# Patient Record
Sex: Female | Born: 1940 | Race: White | Hispanic: No | Smoking: Never smoker
Health system: Southern US, Community
[De-identification: ages and names within clinical notes are randomized; demographics above are authoritative.]

## PROBLEM LIST (undated history)

## (undated) DIAGNOSIS — K219 Gastro-esophageal reflux disease without esophagitis: Secondary | ICD-10-CM

## (undated) DIAGNOSIS — T7840XA Allergy, unspecified, initial encounter: Secondary | ICD-10-CM

## (undated) DIAGNOSIS — N289 Disorder of kidney and ureter, unspecified: Secondary | ICD-10-CM

## (undated) DIAGNOSIS — D649 Anemia, unspecified: Secondary | ICD-10-CM

## (undated) DIAGNOSIS — I5189 Other ill-defined heart diseases: Secondary | ICD-10-CM

## (undated) DIAGNOSIS — M8580 Other specified disorders of bone density and structure, unspecified site: Secondary | ICD-10-CM

## (undated) DIAGNOSIS — I10 Essential (primary) hypertension: Secondary | ICD-10-CM

## (undated) DIAGNOSIS — I427 Cardiomyopathy due to drug and external agent: Secondary | ICD-10-CM

## (undated) DIAGNOSIS — T451X5A Adverse effect of antineoplastic and immunosuppressive drugs, initial encounter: Secondary | ICD-10-CM

## (undated) DIAGNOSIS — C8590 Non-Hodgkin lymphoma, unspecified, unspecified site: Secondary | ICD-10-CM

## (undated) DIAGNOSIS — R011 Cardiac murmur, unspecified: Secondary | ICD-10-CM

## (undated) DIAGNOSIS — S82892A Other fracture of left lower leg, initial encounter for closed fracture: Secondary | ICD-10-CM

## (undated) HISTORY — DX: Cardiomyopathy due to drug and external agent: I42.7

## (undated) HISTORY — DX: Other specified disorders of bone density and structure, unspecified site: M85.80

## (undated) HISTORY — DX: Cardiac murmur, unspecified: R01.1

## (undated) HISTORY — DX: Essential (primary) hypertension: I10

## (undated) HISTORY — DX: Allergy, unspecified, initial encounter: T78.40XA

## (undated) HISTORY — DX: Disorder of kidney and ureter, unspecified: N28.9

## (undated) HISTORY — PX: SPINE SURGERY: SHX786

## (undated) HISTORY — PX: ABDOMINAL HYSTERECTOMY: SHX81

## (undated) HISTORY — DX: Non-Hodgkin lymphoma, unspecified, unspecified site: C85.90

## (undated) HISTORY — DX: Gastro-esophageal reflux disease without esophagitis: K21.9

## (undated) HISTORY — PX: BACK SURGERY: SHX140

## (undated) HISTORY — DX: Adverse effect of antineoplastic and immunosuppressive drugs, initial encounter: T45.1X5A

## (undated) HISTORY — PX: APPENDECTOMY: SHX54

---

## 1998-09-21 ENCOUNTER — Other Ambulatory Visit: Admission: RE | Admit: 1998-09-21 | Discharge: 1998-09-21 | Payer: Self-pay | Admitting: Obstetrics and Gynecology

## 1999-09-29 ENCOUNTER — Other Ambulatory Visit: Admission: RE | Admit: 1999-09-29 | Discharge: 1999-09-29 | Payer: Self-pay | Admitting: Obstetrics and Gynecology

## 2000-12-04 ENCOUNTER — Other Ambulatory Visit: Admission: RE | Admit: 2000-12-04 | Discharge: 2000-12-04 | Payer: Self-pay | Admitting: *Deleted

## 2001-12-25 ENCOUNTER — Other Ambulatory Visit: Admission: RE | Admit: 2001-12-25 | Discharge: 2001-12-25 | Payer: Self-pay | Admitting: Obstetrics and Gynecology

## 2002-01-12 ENCOUNTER — Other Ambulatory Visit: Admission: RE | Admit: 2002-01-12 | Discharge: 2002-01-12 | Payer: Self-pay | Admitting: *Deleted

## 2002-05-18 ENCOUNTER — Encounter: Admission: RE | Admit: 2002-05-18 | Discharge: 2002-05-18 | Payer: Self-pay | Admitting: Family Medicine

## 2002-05-18 ENCOUNTER — Encounter: Payer: Self-pay | Admitting: Family Medicine

## 2003-01-28 ENCOUNTER — Encounter: Payer: Self-pay | Admitting: Family Medicine

## 2003-01-28 ENCOUNTER — Encounter: Admission: RE | Admit: 2003-01-28 | Discharge: 2003-01-28 | Payer: Self-pay | Admitting: Family Medicine

## 2003-02-03 ENCOUNTER — Encounter: Payer: Self-pay | Admitting: Family Medicine

## 2003-02-03 ENCOUNTER — Encounter: Admission: RE | Admit: 2003-02-03 | Discharge: 2003-02-03 | Payer: Self-pay | Admitting: Family Medicine

## 2003-02-05 ENCOUNTER — Encounter: Payer: Self-pay | Admitting: Family Medicine

## 2003-02-05 ENCOUNTER — Encounter: Admission: RE | Admit: 2003-02-05 | Discharge: 2003-02-05 | Payer: Self-pay | Admitting: Family Medicine

## 2003-02-25 ENCOUNTER — Encounter: Payer: Self-pay | Admitting: Neurosurgery

## 2003-03-01 ENCOUNTER — Ambulatory Visit (HOSPITAL_COMMUNITY): Admission: RE | Admit: 2003-03-01 | Discharge: 2003-03-02 | Payer: Self-pay | Admitting: Neurosurgery

## 2003-03-01 ENCOUNTER — Encounter: Payer: Self-pay | Admitting: Neurosurgery

## 2003-03-19 ENCOUNTER — Encounter: Payer: Self-pay | Admitting: Neurosurgery

## 2003-03-19 ENCOUNTER — Ambulatory Visit (HOSPITAL_COMMUNITY): Admission: RE | Admit: 2003-03-19 | Discharge: 2003-03-19 | Payer: Self-pay | Admitting: *Deleted

## 2003-05-27 ENCOUNTER — Other Ambulatory Visit: Admission: RE | Admit: 2003-05-27 | Discharge: 2003-05-27 | Payer: Self-pay | Admitting: Obstetrics and Gynecology

## 2004-05-15 ENCOUNTER — Inpatient Hospital Stay (HOSPITAL_COMMUNITY): Admission: RE | Admit: 2004-05-15 | Discharge: 2004-05-19 | Payer: Self-pay | Admitting: Neurosurgery

## 2004-11-25 ENCOUNTER — Inpatient Hospital Stay (HOSPITAL_COMMUNITY): Admission: EM | Admit: 2004-11-25 | Discharge: 2004-11-28 | Payer: Self-pay | Admitting: Emergency Medicine

## 2005-01-05 ENCOUNTER — Encounter: Admission: RE | Admit: 2005-01-05 | Discharge: 2005-01-05 | Payer: Self-pay | Admitting: Family Medicine

## 2006-01-04 ENCOUNTER — Ambulatory Visit: Payer: Self-pay | Admitting: Family Medicine

## 2006-04-15 ENCOUNTER — Ambulatory Visit: Payer: Self-pay | Admitting: Family Medicine

## 2006-04-18 ENCOUNTER — Ambulatory Visit: Payer: Self-pay | Admitting: Family Medicine

## 2006-07-30 HISTORY — PX: COLONOSCOPY: SHX174

## 2006-11-26 ENCOUNTER — Ambulatory Visit: Payer: Self-pay | Admitting: Internal Medicine

## 2006-12-12 LAB — COMPREHENSIVE METABOLIC PANEL
ALT: 15 U/L (ref 0–35)
Alkaline Phosphatase: 54 U/L (ref 39–117)
BUN: 18 mg/dL (ref 6–23)
CO2: 28 mEq/L (ref 19–32)
Chloride: 102 mEq/L (ref 96–112)
Glucose, Bld: 139 mg/dL — ABNORMAL HIGH (ref 70–99)
Sodium: 141 mEq/L (ref 135–145)
Total Bilirubin: 0.6 mg/dL (ref 0.3–1.2)

## 2006-12-12 LAB — CBC WITH DIFFERENTIAL/PLATELET
BASO%: 0.3 % (ref 0.0–2.0)
Basophils Absolute: 0 10*3/uL (ref 0.0–0.1)
HGB: 15.4 g/dL (ref 11.6–15.9)
MCV: 84.3 fL (ref 81.0–101.0)
MONO#: 0.3 10*3/uL (ref 0.1–0.9)
MONO%: 6.3 % (ref 0.0–13.0)
NEUT#: 3.4 10*3/uL (ref 1.5–6.5)
NEUT%: 66.8 % (ref 39.6–76.8)
Platelets: 150 10*3/uL (ref 145–400)
RBC: 5.14 10*6/uL (ref 3.70–5.32)
WBC: 5.1 10*3/uL (ref 3.9–10.0)

## 2007-05-27 ENCOUNTER — Ambulatory Visit: Payer: Self-pay | Admitting: Family Medicine

## 2007-08-12 ENCOUNTER — Ambulatory Visit: Payer: Self-pay | Admitting: Family Medicine

## 2008-05-12 ENCOUNTER — Ambulatory Visit: Payer: Self-pay | Admitting: Family Medicine

## 2008-08-09 ENCOUNTER — Ambulatory Visit: Payer: Self-pay | Admitting: Family Medicine

## 2008-09-09 ENCOUNTER — Ambulatory Visit: Payer: Self-pay | Admitting: Family Medicine

## 2009-04-08 ENCOUNTER — Ambulatory Visit: Payer: Self-pay | Admitting: Family Medicine

## 2010-04-05 ENCOUNTER — Ambulatory Visit: Payer: Self-pay | Admitting: Family Medicine

## 2010-04-27 ENCOUNTER — Encounter: Admission: RE | Admit: 2010-04-27 | Discharge: 2010-04-27 | Payer: Self-pay | Admitting: Family Medicine

## 2010-05-30 ENCOUNTER — Ambulatory Visit: Payer: Self-pay | Admitting: Family Medicine

## 2010-06-14 ENCOUNTER — Ambulatory Visit: Payer: Self-pay | Admitting: Family Medicine

## 2010-08-07 ENCOUNTER — Ambulatory Visit
Admission: RE | Admit: 2010-08-07 | Discharge: 2010-08-07 | Payer: Self-pay | Source: Home / Self Care | Attending: Family Medicine | Admitting: Family Medicine

## 2010-12-15 NOTE — Discharge Summary (Signed)
NAME:  Tonya Wise, Tonya Wise NO.:  [id number]   MEDICAL RECORD NO.:  [id number]          PATIENT TYPE:  INP   LOCATION:  4704                         FACILITY:  [facility]   PHYSICIAN:  Michaelyn Barter, M.D. DATE OF BIRTH:  04/05/1941   DATE OF ADMISSION:  11/25/2004  DATE OF DISCHARGE:  11/28/2004                                 DISCHARGE SUMMARY   PRIMARY CARE PHYSICIAN:  Sharlot Gowda, M.D.   FINAL DIAGNOSES AT THE TIME OF DISCHARGE:  1.  Nausea, vomiting, diarrhea syndrome.  2.  Atypical chest pain.  3.  Vague abdominal pain.  4.  Questionable syncope.  5.  Hypertension  6.  Chronic low back pain  7.  Acute renal failure  8.  Hypokalemia.   HISTORY OF PRESENT ILLNESS:  Tonya Wise is a 70 year old female who arrived  with a chief complaint of nausea, vomiting, diarrhea over the previous 7-10  days. she later went on to develop abdominal pain. She went on to state that  she became lethargic and weak, and her appetite had decreased. She also went  on to state that the 48 hours prior to admission she developed some midline  chest pain which was burning  and states burning in sensation.  She  complained of some lightheadedness and dizziness.  There is also a question  as to whether not the patient had a syncopal episode approximately 72 hours  prior to the admission   PAST MEDICAL HISTORY:  1.  Hypertension.  2.  Chronic low back pain   PAST SURGICAL HISTORY:  L3-4/ L4-L5 posterior lumbar interbody fusion with  interbody cages and segmented pedicle screw fixation placed at L3-5 with  posterior L3-5 fusion occurring in 2005   ALLERGIES:  PENICILLIN causes hives.   FAMILY HISTORY:  Mother passed away from leukemia.   HOSPITAL COURSE:  #1.  NAUSEA, VOMITING, AND DIARRHEA:  The patient was admitted for further  evaluation of these symptoms. By the following day of her admission, her  symptoms gradually began to resolve, and by 3 days into her admission, the  symptoms had completely resolved.   #2.  ATYPICAL CHEST PAIN:  Over the course of the patient's hospitalization,  her chest pain resolved. She had cardiac enzymes completed. Troponin was  noted to be 0.01 which is completely normal. In addition, she did have a  fasting lipid profile completed. The triglycerides were found to be high at  230. The cholesterol was noted to be 34, which was low, and her LDL was  noted to be 103.   #3.  HYPERTENSION:  Over the course of the patient's hospitalization, her  blood pressure was noted to be elevated. On the day of her admission, her  blood pressure in the ER was noted to be 194/133. Her medications were  gradually titrated up to try to achieve optimal control of her blood  pressure. Controlling her blood pressure was somewhat difficult, however,  because the patient had received some stressful news regarding the death of  close family associate.  Therefore, she became very stressed over the course  of hospitalization   #4  RENAL FAILURE: On the day of her admission, the patient's creatinine was  noted to be 2.8; however, with fluids and close monitoring, this value  gradually declined over the course of hospitalization to 1.8 By the date of  her discharge. This more than likely had a component of prerenal failure to  it.   #5.  CHRONIC BACK PAIN: This remained stable over the course of the  patient's hospitalization.   #6.  HYPOKALEMIA: On the date of discharge, the patient's potassium was  noted to be 3.4. For this, she received some supplementation. By Nov 28, 2004, the patient stated that she had been stressed throughout the course of  the night. She had started crying, stated that she needed to go home on that  particular date to be with her daughters. She had no complaints. Therefore,  the decision was made to discharge the patient home. The patient's vitals  were temperature was 97.6, heart rate in the 70s to 90s, blood pressure was   between 152-184 systolically, 86-104 diastolically. She was saturating 98%  on room air. The decision was made to discharge the patient home at her  request. She was discharged home on the following medications.  1.  Norvasc 10 milligrams 1 tablet daily.  2.  Aspirin 325 milligrams daily.  3.  Lopressor 50 milligrams b.i.d.  4.  Protonix 40 milligrams daily.   She was instructed to avoid salt in her diet and to take all her medications  as prescribed. In addition, she was told to follow up with Dr. Susann Givens  within 1-2 weeks for further evaluation of her blood pressure.       OR/MEDQ  D:  01/20/2005  T:  01/21/2005  Job:  [id number]   cc:   Sharlot Gowda, M.D.  [address]  [place], [place] [address]  Fax: [phone]

## 2010-12-15 NOTE — Op Note (Signed)
NAME:  Tonya Wise, Tonya Wise                          ACCOUNT NO.:  [id number]   MEDICAL RECORD NO.:  [id number]                   PATIENT TYPE:  OIB   LOCATION:  3172                                 FACILITY:  [facility]   PHYSICIAN:  Kathaleen Maser. Pool, M.D.                 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   OPERATION PERFORMED:  Left L4-5 laminotomy and microdiskectomy and left L5-  S1 laminotomy and microdiskectomy.   SURGEON:  Kathaleen Maser. Pool, M.D.   ASSISTANT:  Reinaldo Meeker, M.D.   ANESTHESIA:  General endotracheal.   INDICATIONS FOR PROCEDURE:  Tonya Wise is a 70 year old female with a  history of back and left lower extremity pain consistent with a left-sided  L5 radiculopathy failing conservative management.  MRI scan demonstrates a  large left paracentral disk herniation at L4-5 with compression of the  thecal sac and left-sided L5 nerve root.  She also had a co-existent left-  sided L5-S1 disk herniation with a superior free fragment causing  compression of the exiting left-sided L5 nerve root.  The patient has been  counseled as to her options.  She has decided to proceed with a two-level  laminotomy and microdiskectomy for hopeful improvement of the symptoms.   DESCRIPTION OF PROCEDURE:  The patient was taken to the operating room and  placed on the table in the supine position.  After adequate level of  anesthesia was achieved, the patient was positioned prone onto a Wilson  frame and appropriately padded.  The patient's lumbar region was prepped and  draped sterilely.  A 10 blade was used to  make a linear skin incision  overlying the L4, 5 and S1 levels.  This was carried down sharply in the  midline.  A subperiosteal dissection was then performed exposing the lamina  and facet joints of L4, 5, and S1.  Deep self-retaining retractor was  placed.  Intraoperative x-ray was taken, the levels were confirmed.  The  laminotomy was then performed using high speed drill and Kerrison rongeurs  to remove the inferior aspect of the lamina at L4, the medial aspect of the  L4-5 facet joint complex, the entire left L5 lamina and the medial aspect of  the L5-S1 facet joint complex and the superior rim of the S1 lamina.  The  ligamentum flavum was then elevated and resected in piecemeal fashion at  both levels.  The underlying thecal sac and exiting L5 and S1 nerve roots  were identified.  The microscope was brought into the field and used for  microdissection  of the disk herniation and overlying nerve roots.  The  epidural venous plexus was coagulated and cut.  Starting first at L4-5 on  the left, the thecal sac and L5 nerve root were mobilized and retracted  toward the left.  The epidural veins were coagulated and cut.  The disk  herniation was readily apparent.  This was then incised with a 15 blade in a  rectangular fashion.  A wide disk space clean out was then achieved using  pituitary rongeurs, upward angled pituitary rongeurs and Epstein curets.  All elements of disk herniation were completely resected.  All loose or  obviously degenerated disk material was removed from the interspace.  At  this point a very thorough decompression had been achieved.  There was no  evidence of any further compression.  There was no evidence of any residual  loose disk material either within the disk space or in the canal. Attention  was then placed to L5-S1.  The disk space was identified.  Epidural venous  plexus was coagulated and cut.  Thecal sac and nerve roots were gently  mobilized.  The disk  space itself was reasonably flat and there was no major  evidence of an annular defect.  Moving superiorly to the L5 nerve root, a  moderate amount of free disk herniation was encountered and dissected free.  This was dissected using blunt nerve hooks and pituitary rongeurs.  The disk  space was once again inspected. Given its appearance, it was not felt  necessary to incise the disk space and perform a more aggressive diskectomy  at this level.  The wound was then irrigated with antibiotic solution.  Gelfoam was placed topically for hemostasis which was found to be good.  Microscope and retractor system were removed.  Hemostasis in the muscle was  achieved with electrocautery.  The wound was then closed in layers with  Vicryl sutures.  Steri-Strips and sterile dressing were applied.  There were  no apparent complications.  The patient tolerated the procedure well and  returned to the recovery room postoperatively.                                                Henry A. Pool, M.D.    HAP/MEDQ  D:  03/01/2003  T:  03/01/2003  Job:  [id number]

## 2010-12-15 NOTE — Discharge Summary (Signed)
NAME:  Tonya Wise, PIRANI NO.:  [id number]   MEDICAL RECORD NO.:  [id number]          PATIENT TYPE:  INP   LOCATION:  3018                         FACILITY:  [facility]   PHYSICIAN:  Clydene Fake, M.D.  DATE OF BIRTH:  Oct 09, 1940   DATE OF ADMISSION:  05/15/2004  DATE OF DISCHARGE:  05/19/2004                                 DISCHARGE SUMMARY   DIAGNOSES:  Unstable spondylolisthesis, stenosis, spondylosis and prior  surgery.   DISCHARGE DIAGNOSES:  Unstable spondylolisthesis, stenosis, spondylosis and  prior surgery.   PROCEDURE:  L3-4 and L4-5 redo posterior lumber interbody fusion at 3-4 and  4-5 with  Brantigan interbody cages and segmented pedicle screw fixation at L3-5 with  posterolateral fusion L3-5 with autograft, allograft and Symphony.   REASON FOR ADMISSION:  The patient is a 70 year old woman who is having back  and right leg pain and numbness who underwent left 4-5 laminectomy in 2004.  Really had not had much improvement after that.  Underwent pain management,  multiple injections, but has not given any lasting relief.  New workup  showed a significant decrease in disc height at 4-5 with lordotic changes,  recurrent stenosis, and __________ narrowing, with some retrolisthesis at 3-  4 but does move with flexion and extension showing instability.  The patient  was brought in for redo decompression and fusion at 3-5.   HOSPITAL COURSE:  Patient was admitted day of surgery.  Underwent procedure  above without complications.  Postoperative patient was transferred to  recovery room and then to the floor.  There she was doing well.  She had  some incisional pain, but no leg pain.  Started getting up ambulating.  PT/OT worked with the patient.  She continued making some progress; did have  some left thigh pain as she started moving around but this did continue to  improve.  May 19, 2004 she is overall doing well, little bit of thigh  pain, but overall  motor and sensory examination is intact.  Improving  incisional pain.  Incision was clean, dry and intact.  ____________ by then  and was discharged home in stable condition.   DISCHARGE MEDICATIONS:  Same as prehospitalization, Percocet and Flexeril  p.r.n.   FOLLOWUP:  Three to four weeks in office.       JRH/MEDQ  D:  06/29/2004  T:  06/29/2004  Job:  [id number]

## 2010-12-15 NOTE — H&P (Signed)
NAME:  Tonya Wise, Tonya Wise NO.:  [id number]   MEDICAL RECORD NO.:  [id number]          PATIENT TYPE:  INP   LOCATION:  2899                         FACILITY:  [facility]   PHYSICIAN:  Clydene Fake, M.D.  DATE OF BIRTH:  1941/02/28   DATE OF ADMISSION:  05/15/2004  DATE OF DISCHARGE:                                HISTORY & PHYSICAL   CHIEF COMPLAINT:  Back pain.   HISTORY:  The patient is a 70 year old right-handed woman who has been  having back and some right leg pain and numbness.  In August 2004 she did  undergo left 4-5 laminectomy and maybe also a left 5-1.  Has had episodes of  pain management for back pain, that through multiple injections.  None have  helped much, but activity, standing or sitting too long makes her back hurt,  with occasional similar pain radiating to the right leg, where she had more  left leg symptoms prior to her surgery over a year ago.  MRI and x-rays of  her spine with flexion-extension views show significant decreased disk  height at L4-5 with mortic changes consistent with recurring stenosis and  bifrontal narrowing.  There is a retrolisthesis at 3-4 that does move  between flexion and extension, showing the instability at 3-4 and stenosis  at that level, and the patient is going to be readmitted for a redo  laminectomy decompression and fusion at 3-4 and 4-5.   PAST MEDICAL HISTORY:  Significant for hypertension.   PAST SURGICAL HISTORY:  Previous surgery includes lumbar laminectomy in  2004.   MEDICATIONS:  Toprol, Tramadol, Voltaren.   DRUG ALLERGIES:  PENICILLIN.   SOCIAL HISTORY:  She is married.  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  Does not smoke.  Uses alcohol socially.   REVIEW OF SYSTEMS:  Otherwise negative.   FAMILY HISTORY:  Noncontributory.   PHYSICAL EXAMINATION:  VITAL SIGNS:  The patient's weight is 135, height 5  feet 3 inches.  HEENT:  Unremarkable.  NECK:  Supple.  CHEST:  Lungs  clear.  CARDIAC:  Regular rhythm.  ABDOMEN:  Soft and nontender.  EXTREMITIES:  No edema.  BACK, NEUROLOGIC:  There is a well-healed scar in the middle of the lower  lumbar spine.  Decreased range of motion because of pain at the extremes.  Straight leg raise is negative.  Motor strength intact, sensation intact.   ASSESSMENT AND PLAN:  Patient with unstable spondylolisthesis and stenosis.  The patient is brought in for a decompressive laminectomy and fusion with  instrumentation.       JRH/MEDQ  D:  05/15/2004  T:  05/15/2004  Job:  [id number]

## 2010-12-15 NOTE — Op Note (Signed)
NAME:  Tonya Wise, Tonya Wise NO.:  [id number]   MEDICAL RECORD NO.:  [id number]          PATIENT TYPE:  INP   LOCATION:  2899                         FACILITY:  [facility]   PHYSICIAN:  Clydene Fake, M.D.  DATE OF BIRTH:  1941-03-24   DATE OF PROCEDURE:  05/15/2004  DATE OF DISCHARGE:                                 OPERATIVE REPORT   DIAGNOSIS:  Unstable spondylolisthesis, stenosis, spondylosis and prior  surgery at L3-4, 4-5.   POSTOPERATIVE DIAGNOSIS:  Unstable spondylolisthesis, stenosis, spondylosis  and prior surgery at L3-4, 4-5.   PROCEDURE:  L3-4 and 4-5 redo laminectomy, posterior lumbar interbody fusion  L3-4 and 4-5 with Brantigan interbody cages at L3-4 and L4-5, Expedium  segmented pedicle screw fixation L3-4 and 5, posterolateral fusion L3  through 5 (two levels) with autograft from same incision, allograft and  Symphony system.   SURGEON:  Clydene Fake, M.D.   ASSISTANT:  Cristi Loron, M.D.   ANESTHESIA:  General endotracheal tube anesthesia.   ESTIMATED BLOOD LOSS:  500 mL, some Cell Saver blood given.   DRAINS:  None.   COMPLICATIONS:  None.   DISPOSITION:  The patient transferred to the recovery room.   INDICATIONS FOR PROCEDURE:  The patient is a 70 year old woman who underwent  left-sided lumbar laminectomy at 4-5, 5-1 by another surgery in 2004,  continued to have low back pain with some right leg pain and followed in the  pain clinic.  Repeat imaging, MRI and x-rays with flexion and extension show  instability at 3-4 an 4-5 with continued stenosis.  The patient brought in  for decompression and fusion of lumbar spine.   DESCRIPTION OF PROCEDURE:  The patient brought to the operating room and  general anesthesia induced.  The patient was placed in the prone position on  the Wilson frame with all pressure points padded.  The patient was prepped  and draped in sterile fashion.  The site of incision was injected with 20 mL  1%  lidocaine with epinephrine.  Incision was then made, centered over the  previous incision and this incision was then brought up cephalad.  Incision  taken down to fascia.  Hemostasis obtained with Bovie cauterization.  Fascia  was incised with Bovie and subperiosteal dissection was done over the  spinous process of lamina.  After this, L3, 4, 5 spinous process, transverse  process of L4 and L5, were found to be dissected out on the opposite site.  We placed self-retaining retractor, brought fluoroscope in with markers and  confirmed we were at the L4-5 interspace at our lowest level.  Incision was  then made slightly cephalad some more, incised subcutaneous tissue and  fascia.  Subperiosteal dissection was done at L2 spinous process in the  lamina leaving the facet intact, dissecting out over it and the L3  transverse processes.  We now had exposure from transverse process of L3  through L5.  We again confirmed position with fluoroscopic imaging and  decompressive laminectomy was performed removing the bottom half of L3, all  of L4 and top of L5  started with Leksell rongeurs and with Kerrison punches  and high speed drill.  Full facetectomies ended up being performed at 3-4  and 4-5 so we can get out lateral to this space on the left side lateral to  the scar that was there at 4-5.  We carefully dissected the scar __________  dura.  We were able to get over the nerve roots.  Ligamentum flavum was  removed and we brought the nerve roots out and we had good decompression  central and laterally over the three roots __________ five roots bilaterally  with central canal all decompressed.  Attention was then taken to the disc  spaces.  Large similar disc protrusions were seen.  Disc spaces were incised  with #15 blade at 4-5 level bilaterally and discectomy performed with  pituitary rongeurs and curettes.  We did find some recurrent disc herniation  within the left far lateral area at the 4-5  level.  We distracted the space  up to 9 mm, prepared the interspace for interbody fusion with the broaches  per the McKee system.  We had good __________ nerves removed over the  disc material and good distraction of the interbody space and space is  prepared.  All the bone that we removed during the laminectomy was chopped  clean from soft tissue, chopped in small pieces, placed through the Symphony  system to get __________.  This bone mixture was then packed into two 9 x 9  mm running cages.  We packed some of the same bone in the interspace and  then tapped a cage into one side while distraction on the opposite side,  removed the distractor put more autograft bone in and then tapped another  cage into place while we protected the nerve roots.  We had good position of  the cages, good restoration of the space at 4-5 of decreased __________  scoliosis we are working on correcting.  Attention was then taken to 3-4  disc space and incised with a 15 blade, and discectomy performed with  pituitary rongeurs and curettes.  Lateral disc space also explored and disc  removed.  We distracted the interspace up to 11 mm __________ the interbody  space for interbody fusion using the broaches.  When we were satisfied with  decompression, an __________ x 99 wire and __________ cages packed with  autograft Symphony bone mixture.  The same bone mixture was packed in the  interbody space and then the cage tapped with a __________ we held  distraction on the other.  Distractor was removed and packed cage to that  side.  With good position of the cages countersunk well, good decompression  of the nerves.  Wound irrigated with antibiotic solution.  Gelfoam and  thrombin in the epidural space for hemostasis.  Attention was then turned to  the transverse process and lateral facets which was decorticated with high speed drill L3, 4 and 5. Using fluoroscopy we found pedicle anterior points  at L3  bilaterally.  We decorticated with high speed drill.  We placed  __________ down the pedicle using fluoroscopy as a guide.  We checked the  hole with small __________ probe.  We had a bony circumference.  We tapped  the hole and then we checked the hole with a small round __________ making  sure we had a bony circumference.  We then placed a 45 mm Expedium screw x 6  wide.  This process was repeated on the left side at L3  level.  Attention  was then taken to L4 and found pedicle anterior points decorticated.  We  placed a probe, tapped, checked the hall with __________ and we used  fluoroscopy throughout this process and then placed another 45 mm screw and  repeated this on the opposite side of L4-L5.  We again decorticated  __________ points, placed a probe down and into the pedicle, tapped it using  small __________ making sure we had bony circumference and placed another 45  mm screw which was repeated on the opposite side.  Bilateral fluoroscopic  imaging showed good position of the screws at all levels and good position  of interbody cages.  Rods were placed on the screw heads and locking nuts  placed.  The nuts over L3 were tightened down with distraction over 3-4,  nuts in L4 were tightened down.  There was compression over 3-4 with  compression over 4-5.  We then locked the nuts at L5.  Final AP and lateral  fluoroscopic images were obtained showing good position of all the screws,  rods, interbody cages and good alignment of the spine in the sagittal and  coronal planes.  Wounds irrigated with antibiotic solution and the rest of  the Autograft Symphony bone mixture along with the Allograft bone mixture  was mixed together and this bone was then packed  in the posterolateral  gutters for two level posterolateral fusion from L3 through L5 bilaterally.  Retractors removed.  We again checked the dura.  We had good decompression.  All of the roots showed clear placement of Gelfoam in the  gutters and the  __________ muscle closed with 0 Vicryl interrupted sutures.  Fascia closed  with 0 Vicryl interrupted sutures.  The subcutaneous tissue closed with 0, 2-  0 and 3-0 Vicryl  interrupted sutures.  The skin closed with Benzoin and Steri-Strips.  Dry  sterile dressing was placed.  The patient was placed back in the supine  position, awakened from anesthesia and transferred to the recovery room in  stable condition.       JRH/MEDQ  D:  05/15/2004  T:  05/15/2004  Job:  [id number]

## 2010-12-15 NOTE — H&P (Signed)
NAME:  REAGHAN, KAWA NO.:  [id number]   MEDICAL RECORD NO.:  [id number]          PATIENT TYPE:  EMS   LOCATION:  [facility]                         FACILITY:  [facility]   PHYSICIAN:  Gertha Calkin, M.D.DATE OF BIRTH:  [birth year]   DATE OF ADMISSION:  11/25/2004  DATE OF DISCHARGE:                                HISTORY & PHYSICAL   PRIMARY CARE PHYSICIAN:  Sharlot Gowda, M.D.   HISTORY OF PRESENT ILLNESS:  This is a pleasant, 70 year old, Caucasian  female, past medical history significant for hypertension, who presents with  a history of 7-10 days of nausea, vomiting, diarrhea that is nonbilious and  nonbloody.  She is unable to state whether or not this is related to going  to any new restaurants but denies any other family members or friends having  similar symptoms.  She states that initially it was associated with mostly  nausea and diarrhea but then later started developing abdominal pain.  The  abdominal pain really started approximately 24-36 hours prior to admission.  She denies any hematemesis or hemoptysis.  Denies any previous history of  similar symptoms.  She states she does drink well water but denies that any  other family members in the household have similar symptoms.  After the  course of the last few days, she has become increasingly lethargic and weak  in general.  Her appetite, as of Monday approximately six days prior to  admission, has also decreased.  She denies keeping up with adequate fluid  intake during this time period.  No recent travel history or exotic food  ingestions.  In regards to the abdominal pain, she states that the abdominal  pain has more or less worsened over the last few days but when questioned  further states that she has had this abdominal discomfort for approximately  a month.  She was worried that she was developing an ulcer from chronic  medications.  Of note, she is prescribed pain medications for chronic back  pain  status post surgery (see problems list below).  When asked to point to  the location of her abdominal pain, she points to an area around the  suprapubic region.  In addition to these symptoms, she also states that over  the last 48 hours she has developed some midline chest pain that is more  burning in quality than anything.  She denies any radiation.  No  palpitations.  No diaphoresis.  These symptoms were not associated but she  states she is having hot flashes but not associated with the chest  discomfort.  She also admits to lightheadedness and dizziness but this has  also come on in the last few days after at least a week of poor intake and  nausea and vomiting and diarrhea.  After further questioning, she states  that she may have had a syncopal spell approximately 72 hours prior to  admission where she blacked out but denies any postictal like symptoms.  She  states that she was getting up to go to the bathroom and then was too weak  and fell and  may or may not have blacked out completely.  She does not  remember having any chest pain, chest tightness.  Family in the room  includes her daughter and her husband who state that about 24 to 36 hours  prior to arrival to the ED, the patient was having some difficulty in  forming words and maybe some increased weakness in her left arm.   PAST MEDICAL HISTORY:  1.  Hypertension.  2.  Chronic lower back pain.  3.  In 2005, she underwent a L3-L4/L4-L5 posterior lumbar interbody fusion      with a Brantigan interbody cages and segmented pedicle screw fixation      placed at L3-L5 with a posterior L3-L5 fusion.   MEDICATIONS:  1.  Toprol 25 mg p.o. every day.  2.  Diclofenac 10 mg p.o. every day.  3.  Cyclobenzaprine p.r.n.  4.  Phenergan p.r.n. (Phenergan was started on November 23, 2004).   ALLERGIES:  PENICILLIN causes her to have hives.   PAST SURGICAL HISTORY:  Only pertinent for her back.  No abdominal or  thoracic surgeries.    FAMILY HISTORY:  Positive for leukemia in her mother in her 63s, passed away  from that but no known coronary artery disease, strokes.   REVIEW OF SYSTEMS:  Essentially negative except for those mentioned in the  HPI.  She denies any genitourinary symptoms.  There is no lower extremity  swelling, orthopnea, cough, cold, congestion.  No night sweats or weight  loss, weight gain.   PHYSICAL EXAMINATION:  VITAL SIGNS:  Temperature is 97.3, blood pressure  180/111, pulse of 84, respirations 24, 97% on room air.  GENERAL:  This is a well-developed, well-nourished, white female in no acute  distress lying in bed.  HEENT:  Pertinent for dry mucous membranes.  Otherwise pupils are equal,  round, reactive to light.  Extraocular movements are intact.  Posterior  oropharynx without erythema and exudate.  NECK:  Supple without masses or bruits.  No lymphadenopathy.  CHEST:  Clear to auscultation bilaterally with some minimal crackles in the  bilateral bases that clear after a vigorous cough.  No accessory muscle use.  No wheezing.  CARDIOVASCULAR:  Regular rate and rhythm.  No murmurs, rubs or gallops.  PMI  is nondisplaced.  ABDOMEN:  Diffuse tenderness throughout.  No rebound, rigidity.  Bowel  sounds are present.  No flank tenderness.  No Murphy sign.  No abdominal  bruits noted.  EXTREMITIES:  Without clubbing, cyanosis, or edema.  Pulses are intact and  symmetric upper and lower.  NEUROLOGIC:  Cranial nerves II-XII are intact.  Alert and oriented x 3.  Strength is symmetric intact, 5/5.   LABS:  EKG shows T wave inversion in the inferior and lateral leads,  otherwise no changes, normal axis, normal intervals.   CT of the head without contrast shows impression:  1.  Minimal small vessel  white matter ischemic changes.  2.  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  Chest x-ray only shows findings consistent with stable change associated  with COPD and stable carotid  artery calcification.   Hemoglobin is 14.7, white count of 3.7, platelets 185.  INR 1.0, PTT 35.  Her sodium is 139, potassium is 4.1, chloride of 103, bicarb 32, glucose of  82, BUN of 36, creatinine of 2.8, calcium of 12.  UA is negative.   ASSESSMENT:  1.  Nausea, vomiting, diarrhea syndrome.  2.  Atypical chest pain.  3.  Vague abdominal pain.  4.  Questionable syncope and unsteadiness.  5.  Hypertension.  6.  Chronic lower back pain, status post surgery.  7.  Deep vein thrombosis and gastrointestinal prophylaxis.   PLAN:  1.  At this time it is difficult to sort out all her symptoms to identify      one unifying causative agent.  2.  I will admit her for supportive treatment which will include IV fluids      and antiemetics.  3.  While she is here, we will send her stool for O&P, white count, and      culture to rule out any treatable bacterial agent.  4.  Given her history of possible syncope, unsteadiness, and chest pain, we      will check serial cardiac enzymes and repeat a 12-lead EKG, given her      abnormal EKGs in the ED.  Note, she is not having any chest pain during      this ED H&P.  5.  We will also check a fasting lipid profile and place her empirically on      a PPI.  6.  We will check an amylase and lipase to rule out any other causes of her      abdominal pain.  Most worrisome would be gallbladder issues.  7.  There was no C-MET ordered; therefore, we will add on LFTs to make sure      there is no obstructive pattern on her LFTs.  8.  In addition, given her recent onset of unsteadiness and negative CT      findings, we will followup with an MRI/MRA to rule out any acute      neurologic insult or injury.  9.  We will also check abdominal x-rays to rule out any impaction as she      stated in the end that she has not had a bowel movement in over two      days.      JD/MEDQ  D:  11/25/2004  T:  11/26/2004  Job:  [id number]   cc:   Sharlot Gowda, M.D.  [address]  [place], [place] [address]  Fax: [phone]

## 2010-12-21 ENCOUNTER — Ambulatory Visit (INDEPENDENT_AMBULATORY_CARE_PROVIDER_SITE_OTHER): Payer: Medicare Other | Admitting: Medical

## 2010-12-21 ENCOUNTER — Encounter: Payer: Self-pay | Admitting: Medical

## 2010-12-21 VITALS — BP 124/84 | HR 60 | Temp 97.8°F | Ht 63.0 in | Wt 171.0 lb

## 2010-12-21 DIAGNOSIS — R071 Chest pain on breathing: Secondary | ICD-10-CM

## 2010-12-21 DIAGNOSIS — R0789 Other chest pain: Secondary | ICD-10-CM

## 2010-12-21 DIAGNOSIS — J069 Acute upper respiratory infection, unspecified: Secondary | ICD-10-CM

## 2010-12-21 MED ORDER — HYDROCODONE-HOMATROPINE 5-1.5 MG/5ML PO SYRP: 5.0000 mL | ORAL_SOLUTION | Freq: Four times a day (QID) | ORAL | Status: AC | PRN

## 2010-12-21 MED ORDER — AZITHROMYCIN 250 MG PO TABS: ORAL_TABLET | ORAL | Status: AC

## 2010-12-21 NOTE — Progress Notes (Signed)
Subjective:     Tonya Wise is a 70 y.o. female who presents for evaluation of symptoms of a URI. Symptoms include coryza, non productive cough, sinus pressure and sore throat. Feels tired in general, decreased energey, increased sleepiness.  Onset of symptoms was 1 week ago, and has been unchanged since that time. Treatment to date: NyQuil.  Denies sick contacts.  No other aggravating or relieving factors.  No other c/o.  The following portions of the patient's history were reviewed and updated as appropriate: allergies, current medications, past family history, past medical history, past social history, past surgical history and problem list.  Review of Systems Constitutional: +had low grade fever and chills that resolved; denies sweats, anorexia Skin: denies rash HEENT: +sore throat; denies ear pain, itchy watery eyes Cardiovascular: +sternal chest pain, pain with deep breahting Lungs: denies wheezing, SOB Abdomen: denies abdominal pain, nausea, vomiting, diarrhea GU: denies dysuria  Objective:   Filed Vitals:   12/21/10 1155  BP: 124/84  Pulse: 60  Temp: 97.8 F (36.6 C)    General appearance: Alert, WD/WN, no distress, mildly ill appearing                             Skin: warm, no rash                           Head: no sinus tenderness                            Eyes: conjunctiva normal, corneas clear, PERRLA                            Ears: pearly TMs, external ear canals normal                          Nose: septum midline, turbinates swollen, with erythema and clear discharge             Mouth/throat: MMM, tongue normal, mild pharyngeal erythema                           Neck: supple, no adenopathy, no thyromegaly, nontender                          Heart: RRR, normal S1, S2, no murmurs                         Lungs: CTA bilaterally, no wheezes, rales, or rhonchi                  Chest wall: tender over sternum region     Assessment:   Encounter Diagnoses  Name  Primary?  Marland Kitchen Upper respiratory infection Yes  . Chest wall pain      Plan:   Discussed diagnosis and treatment of URI. Based on symptoms, her URI is resolving.  Hycodan for bad coughing episodes.  Suggested symptomatic OTC remedies.  Nasal saline spray for congestion.  Tylenol or Ibuprofen OTC for fever and malaise.  Call/retrurn in 2-3 days if symptoms aren't resolving.  If things worsen, gave script for Zpak that she can begin.  Chest wall pain - OTC Aleve or Advil prn.

## 2011-01-04 ENCOUNTER — Telehealth: Payer: Self-pay | Admitting: Medical

## 2011-01-05 ENCOUNTER — Telehealth: Payer: Self-pay | Admitting: *Deleted

## 2011-01-05 NOTE — Telephone Encounter (Signed)
We saw her 12/21/10.  Call out another round of Hycodan syrup, 1 tsp q6 hours prn cough, #120 ml, no refill.  However, if not improving by Monday, need OV recheck.

## 2011-01-05 NOTE — Telephone Encounter (Signed)
Done

## 2011-01-05 NOTE — Telephone Encounter (Signed)
Called in hycodan syrup 1 tsp q 6 hours prn cough #120 ml 0 refill to [facility] at [phone].  CM, LPN

## 2011-01-05 NOTE — Telephone Encounter (Signed)
What are her symptoms?  Any fever?  Is it just a lingering cough, or does she feel no better at all?  Did she take the Zpak?  Let me know and I'll advise.

## 2011-01-05 NOTE — Telephone Encounter (Signed)
GIVEN TO CORA TO HANDLE

## 2011-01-10 ENCOUNTER — Telehealth: Payer: Self-pay | Admitting: Medical

## 2011-01-10 NOTE — Telephone Encounter (Signed)
If not improved, need to recheck.  If still coughing up green mucous, we may need to get a chest xray at this point and blood count.  Have her come in.

## 2011-01-10 NOTE — Telephone Encounter (Signed)
Called pt and pt still coughing up green mucous.  Scheduled pt to come in for follow up with poss. Chest xray and labs on 01-11-11 at 9:30 am.  CM, LPN

## 2011-01-11 ENCOUNTER — Encounter: Payer: Self-pay | Admitting: Medical

## 2011-01-11 ENCOUNTER — Ambulatory Visit (INDEPENDENT_AMBULATORY_CARE_PROVIDER_SITE_OTHER): Payer: Medicare Other | Admitting: Medical

## 2011-01-11 VITALS — BP 122/78 | HR 62 | Temp 97.5°F | Ht 63.0 in | Wt 172.0 lb

## 2011-01-11 DIAGNOSIS — R5383 Other fatigue: Secondary | ICD-10-CM

## 2011-01-11 DIAGNOSIS — R5381 Other malaise: Secondary | ICD-10-CM

## 2011-01-11 DIAGNOSIS — R05 Cough: Secondary | ICD-10-CM

## 2011-01-11 MED ORDER — METHYLPREDNISOLONE ACETATE 80 MG/ML IJ SUSP
80.0000 mg | Freq: Once | INTRAMUSCULAR | Status: AC
  Administered 2011-01-11: 80 mg via INTRAMUSCULAR

## 2011-01-11 MED ORDER — CEFUROXIME AXETIL 500 MG PO TABS: 500.0000 mg | ORAL_TABLET | Freq: Two times a day (BID) | ORAL | Status: AC

## 2011-01-11 MED ORDER — METHYLPREDNISOLONE ACETATE 80 MG/ML IJ SUSP: 80.0000 mg | Freq: Once | INTRAMUSCULAR | Status: DC

## 2011-01-11 MED ORDER — HYDROCODONE-HOMATROPINE 5-1.5 MG/5ML PO SYRP: 5.0000 mL | ORAL_SOLUTION | Freq: Four times a day (QID) | ORAL | Status: AC | PRN

## 2011-01-11 NOTE — Progress Notes (Signed)
Subjective:     Tonya Wise is a 70 y.o. female who presents for evaluation of productive cough.  I originally saw her 12/21/10 for the bronchitis.  At this point she has finished a round of Zpak.  Hycodan cough syrup helped, but she notes that she is still having lots of cough, fatigued, just doesn't feel much better, still having productive green sputum.  Denies sick contacts.  No other aggravating or relieving factors.  No other c/o.  She denies calve pain, denies recent travel, surgery, procedure, HRT, hx/o DVT/PE.    The following portions of the patient's history were reviewed and updated as appropriate: allergies, current medications, past family history, past medical history, past social history, past surgical history and problem list.  Past Medical History  Diagnosis Date  . Hypertension   . Renal insufficiency   . Osteopenia   . Allergy   . Heart murmur   . GERD (gastroesophageal reflux disease)     Review of Systems Constitutional: denies chills, fever, sweats, anorexia Skin: denies rash HEENT: denies ear pain,sore throat, itchy watery eyes Cardiovascular: denies chest pain, palpitations Lungs: +productive sputum, occasional wheezing, but no SOB, hemoptysis, orthopnea, PND Abdomen: +occasional reflux, denies abdominal pain, nausea, vomiting, diarrhea GU: denies dysuria Extremities: denies edema, myalgias, arthralgias  Objective:   Filed Vitals:   01/11/11 0926  BP: 122/78  Pulse: 62  Temp: 97.5 F (36.4 C)    General appearance: Alert, WD/WN, no distress, coughing                             Skin: warm, no rash, no diaphoresis                           Head: no sinus tenderness                            Eyes: conjunctiva normal, corneas clear, PERRLA                            Ears: pearly TMs, external ear canals normal                          Nose: septum midline, turbinates clear, no erythema or discharge             Mouth/throat: MMM, tongue normal,  mild pharyngeal erythema                           Neck: supple, no adenopathy, no thyromegaly, nontender, no JVD                          Heart: RRR, normal S1, S2, no murmurs                         Lungs: somewhat bronchial sounding, but no rhonchi, no wheezes, no rales                Extremities: no edema, nontender, no calve tenderness     Assessment:   Encounter Diagnoses  Name Primary?  . Cough Yes  . Fatigue     Plan:   CXR today with no acute changes, no pneumonia, no mass, slight bronchitic  changes.  Advised pt that given her ongoing cough and productive sputum, we will go 1 more round of antibiotics (Ceftin), gave refill on Hycodan, and Depo Medrol 80mg  IM today.  Tylenol OTC for fever and malaise.  Call/return if symptoms persist or not improving.

## 2011-02-07 ENCOUNTER — Telehealth: Payer: Self-pay | Admitting: *Deleted

## 2011-02-07 NOTE — Telephone Encounter (Signed)
She is coughing up green mucous and has the coughing spells in the morning and evening. Pt has   No other symptoms.  CM, LPN

## 2011-02-07 NOTE — Telephone Encounter (Signed)
Other than cough, what other symptoms?  Any fever, does she feel sick?  Wheezing? SOB?

## 2011-02-08 NOTE — Telephone Encounter (Signed)
Notified pt that if she can take Zyrtec at bedtime and see if that helps with the cough, if symptoms continue to make an appointment to come back in for further testing.  CM, LPN

## 2011-02-08 NOTE — Telephone Encounter (Signed)
See note below

## 2011-02-08 NOTE — Telephone Encounter (Signed)
If she continues to have ongoing cough, I would suggest taking Zyrtec daily at bedtime.  And if this doesn't help, she may want to return to consider other reasons and testing for why she continues to have cough.

## 2011-02-19 ENCOUNTER — Ambulatory Visit (INDEPENDENT_AMBULATORY_CARE_PROVIDER_SITE_OTHER): Payer: Medicare Other | Admitting: Medical

## 2011-02-19 ENCOUNTER — Encounter: Payer: Self-pay | Admitting: Medical

## 2011-02-19 ENCOUNTER — Telehealth: Payer: Self-pay | Admitting: Medical

## 2011-02-19 VITALS — BP 136/80 | HR 60 | Temp 97.6°F | Ht 63.0 in | Wt 170.0 lb

## 2011-02-19 DIAGNOSIS — R05 Cough: Secondary | ICD-10-CM

## 2011-02-19 DIAGNOSIS — R5381 Other malaise: Secondary | ICD-10-CM

## 2011-02-19 DIAGNOSIS — R0602 Shortness of breath: Secondary | ICD-10-CM

## 2011-02-19 DIAGNOSIS — R5383 Other fatigue: Secondary | ICD-10-CM

## 2011-02-19 MED ORDER — HYDROCODONE-HOMATROPINE 5-1.5 MG/5ML PO SYRP: 5.0000 mL | ORAL_SOLUTION | Freq: Four times a day (QID) | ORAL | Status: AC | PRN

## 2011-02-19 NOTE — Progress Notes (Signed)
  Subjective:   HPI  Tonya Wise is a 70 y.o. female who presents for recheck on cough.  Has had ongoing cough since May.  Initially I treated her for bronchitis symptoms in May and June.  However, after 2 rounds of antibiotics and cough syrup, just not getting better.  She does have a history of GERD, intermittent, not on medication.  She does have some allergy and post nasal drip symptoms, using Claritin daily. Lately feels like an elephant sitting on her chest, gets exhausted very easily, and gets some dizziness, particularly worse when standing from seated position.  Cough worse at night.  At times gets shortness of breath or cough reclined, but no worse when lying flat.  Has some headaches often of late.  She notes last cardiac workup 2001.  Denies hx/o clot.  No recent long travel, HRT, surgery, procedure or bed ridden status.  The following portions of the patient's history were reviewed and updated as appropriate: allergies, current medications, past family history, past medical history, past social history, past surgical history and problem list.  Past Medical History  Diagnosis Date  . Hypertension   . Renal insufficiency   . Osteopenia   . Allergy   . Heart murmur   . GERD (gastroesophageal reflux disease)      Review of Systems Constitutional: + fatigue denies fever, chills, sweats, unexpected weight change Allergy: +congestion, sneezing Dermatology: denies rash ENT:  ear pain, sore throat, hoarseness, sinus pain Cardiology: denies chest pain, palpitations, edema Respiratory: + cough, shortness of breath, wheezing Gastroenterology: denies abdominal pain, nausea, vomiting, diarrhea, constipation Urology: denies dysuria     Objective:   Physical Exam  General appearance: alert, no distress, WD/WN, white female, coughing Skin: warm, moist HEENT: normocephalic, sclerae anicteric, TMs pearly, nares patent, no discharge or erythema, pharynx with mild erythema Oral  cavity: MMM, no lesions Neck: supple, no lymphadenopathy, no thyromegaly, no masses, no JVD, no bruits Heart: RRR, II/VI somewhat faint brief systolic murmur heard best in left upper sternal border, otherwise normal S2 Lungs: CTA bilaterally, no wheezes, rhonchi, or rales Abdomen: +bs, soft, non tender, non distended, no masses, no hepatomegaly, no splenomegaly Extremities: no edema, no cyanosis, no clubbing Pulses: 2+ symmetric, upper and lower extremities, normal cap refill   Assessment :    Encounter Diagnoses  Name Primary?  . Cough Yes  . Shortness of breath   . Fatigue      Plan:    EKG today with sinus bradycardia, rate 55 bpm, normal intervals, axis 26, T wave inversion III and V1, no change from prior 05/28/00 EKG, risk factors = age and HTN.  Impression: sinus bradycardia, no change from prior.  Reviewed 05/2000 Echocardiogram showing EF 50%, mild apical hypokinesis, mildly thickened aortic valve with no stenosis or regurgitation, mitral annular calcification, otherwise unremarkable.    We discussed possible causes of chronic cough.   We will get some labs today to further eval. Discussed that if D-dimer elevated, may need to get chest CT.   Assuming normal labs, I advised trial of Nexium daily, c/t Claritin or switch to Allegra OTC, hydrate well, can use Hycodan prn.  Gave Proair inhaler and discussed proper usage, and she can use this q4-6 hours for wheezing.  We will call this afternoon with lab results and additional plan.

## 2011-02-20 ENCOUNTER — Telehealth: Payer: Self-pay | Admitting: *Deleted

## 2011-02-20 ENCOUNTER — Ambulatory Visit: Payer: Medicare Other | Admitting: Medical

## 2011-02-20 LAB — COMPREHENSIVE METABOLIC PANEL
AST: 18 U/L (ref 0–37)
Albumin: 4.2 g/dL (ref 3.5–5.2)
Alkaline Phosphatase: 64 U/L (ref 39–117)
BUN: 18 mg/dL (ref 6–23)
Chloride: 103 mEq/L (ref 96–112)
Creat: 1.17 mg/dL — ABNORMAL HIGH (ref 0.50–1.10)
Potassium: 4.6 mEq/L (ref 3.5–5.3)

## 2011-02-20 LAB — CBC WITH DIFFERENTIAL/PLATELET
Basophils Absolute: 0 10*3/uL (ref 0.0–0.1)
Basophils Relative: 1 % (ref 0–1)
Eosinophils Absolute: 0.3 10*3/uL (ref 0.0–0.7)
Eosinophils Relative: 6 % — ABNORMAL HIGH (ref 0–5)
Hemoglobin: 14.7 g/dL (ref 12.0–15.0)
Lymphocytes Relative: 28 % (ref 12–46)
Neutrophils Relative %: 53 % (ref 43–77)
WBC: 4.5 10*3/uL (ref 4.0–10.5)

## 2011-02-20 LAB — D-DIMER, QUANTITATIVE: D-Dimer, Quant: 0.35 ug/mL-FEU (ref 0.00–0.48)

## 2011-02-20 NOTE — Telephone Encounter (Addendum)
Message copied by Dorthula Perfect on Tue Feb 20, 2011 12:19 PM ------      Message from: Jac Canavan      Created: Tue Feb 20, 2011  8:35 AM       The marker for heart damage is still pending, will hopefully have result by lunch time.  Othewise, liver, electrolytes, blood counts ok.  Platelets are a little low, and they have been low before per her prior labs in chart.  Her marker for blood clot is negative which is good!  Make sure she is using both her allergy pill and the Nexium daily.  The Nexium should be taken in the morning 30-45 minutes before she eats anything.  See if she used the inhaler?  If so, did it help?  If not, I want her to use the inhaler 3 times a day for the next few days until the cough calms down.  Send this back to me with update once you talk to her.    Pt notified of lab results.  Pt stated that she has not used her inhaler yet but will start using it 3 times daily.  Pt advised to use allergy pill and the nexium daily.  CM, LPN

## 2011-02-20 NOTE — Telephone Encounter (Signed)
Message copied by Dorthula Perfect on Tue Feb 20, 2011 12:19 PM ------      Message from: Jac Canavan      Created: Tue Feb 20, 2011 11:58 AM       See prior message/note below.  The marker for heart damage is normal.

## 2011-02-21 NOTE — Telephone Encounter (Signed)
See above

## 2011-02-28 ENCOUNTER — Telehealth: Payer: Self-pay | Admitting: Medical

## 2011-03-01 ENCOUNTER — Telehealth: Payer: Self-pay | Admitting: Medical

## 2011-03-01 NOTE — Telephone Encounter (Signed)
pls refer to Dr. Wert/Pulmonology for chronic cough.  Send the last 2 OV notes, lab results, etc.

## 2011-03-01 NOTE — Telephone Encounter (Signed)
PT INFORMED-LM 

## 2011-03-01 NOTE — Telephone Encounter (Signed)
Pt has been scheduled with Dr. Sherene Sires Pulmonology at 11:30am on 03-15-11.  Pt is aware of appointment.  CM, LPN

## 2011-03-05 ENCOUNTER — Telehealth: Payer: Self-pay | Admitting: Medical

## 2011-03-05 NOTE — Telephone Encounter (Signed)
Handled.   

## 2011-03-06 ENCOUNTER — Other Ambulatory Visit: Payer: Self-pay | Admitting: Medical

## 2011-03-06 ENCOUNTER — Telehealth: Payer: Self-pay | Admitting: Medical

## 2011-03-06 MED ORDER — BENZONATATE 200 MG PO CAPS: 200.0000 mg | ORAL_CAPSULE | Freq: Three times a day (TID) | ORAL | Status: AC | PRN

## 2011-03-06 NOTE — Telephone Encounter (Signed)
Pt has appoint w/wert on the 17th

## 2011-03-06 NOTE — Telephone Encounter (Signed)
i sent tessalon perles for cough to pharmacy.  I had her chart yesterday without a phone msg in my box.  Pls ask when her pulmonology appt is?

## 2011-03-07 NOTE — Telephone Encounter (Signed)
handled

## 2011-03-08 ENCOUNTER — Telehealth: Payer: Self-pay | Admitting: Internal Medicine

## 2011-03-08 NOTE — Telephone Encounter (Signed)
Office is closed- [employer].

## 2011-03-09 ENCOUNTER — Ambulatory Visit
Admission: RE | Admit: 2011-03-09 | Discharge: 2011-03-09 | Disposition: A | Discharge status: Home / Self Care | Payer: Medicare Other | Source: Ambulatory Visit | Attending: Medical | Admitting: Medical

## 2011-03-09 ENCOUNTER — Other Ambulatory Visit: Payer: Self-pay | Admitting: Medical

## 2011-03-09 ENCOUNTER — Telehealth: Payer: Self-pay | Admitting: *Deleted

## 2011-03-09 DIAGNOSIS — R05 Cough: Secondary | ICD-10-CM

## 2011-03-09 MED ORDER — CHLORPHENIRAMINE-HYDROCODONE 8-10 MG/5ML PO LQCR: 5.0000 mL | Freq: Two times a day (BID) | ORAL | Status: DC | PRN

## 2011-03-09 MED ORDER — PREDNISONE 10 MG PO TABS: ORAL_TABLET | ORAL | Status: DC

## 2011-03-09 NOTE — Telephone Encounter (Signed)
I called and spoke with pt and Pt rescheduled to see RA on Monday 03/12/2011

## 2011-03-09 NOTE — Telephone Encounter (Signed)
Pt was sent for chest xray at [facility] and prednisone pack and Tussionex was called into [facility] at [phone].  CM, LPN

## 2011-03-12 ENCOUNTER — Ambulatory Visit (INDEPENDENT_AMBULATORY_CARE_PROVIDER_SITE_OTHER): Payer: Medicare Other | Admitting: Pulmonary Disease

## 2011-03-12 ENCOUNTER — Telehealth: Payer: Self-pay | Admitting: *Deleted

## 2011-03-12 ENCOUNTER — Encounter: Payer: Self-pay | Admitting: Pulmonary Disease

## 2011-03-12 VITALS — BP 122/70 | HR 59 | Temp 97.9°F | Ht 63.0 in | Wt 175.4 lb

## 2011-03-12 DIAGNOSIS — R05 Cough: Secondary | ICD-10-CM

## 2011-03-12 NOTE — Assessment & Plan Note (Addendum)
Appears post bronchitic, perhaps triggered by sino-bronchitis Perpetuating factors may include upper airway and/ or GERD Take Qvar 80 2 puffs daily- RINSE mouth after Complete prednisone dosepak. Tussionex ok - NO DRIVING - Alternatively, can use DELSYM (OTC) as cough syrup Stay on ZYRTEC-D & prilosec If persistent x 1 month, consider CT sinuses & allergy testing

## 2011-03-12 NOTE — Telephone Encounter (Addendum)
Message copied by Dorthula Perfect on Mon Mar 12, 2011  8:28 AM ------      Message from: Jac Canavan      Created: Fri Mar 09, 2011  4:41 PM       No new changes on CXR.  No pneumonia, mass, no signs of CHF.  Lets use the round of steroid and Tussionex called out.  F/u with pulm.   Pt notified of chest xray results.  Pt has started on steroids and tussionex.  Will follow up with pulmonologist this Friday.  CM, LPN

## 2011-03-12 NOTE — Patient Instructions (Signed)
Your cough seems to be related to your episode of sinusitis/ bronchitis. Take Qvar 80 2 puffs daily- RINSE mouth after Complete prednisone dosepak. Tussionex ok - NO DRIVING - Alternatively, can use DELSYM (OTC) as cough syrup Stay on ZYRTEC-D & prilosec

## 2011-03-12 NOTE — Progress Notes (Signed)
  Subjective:    Patient ID: Tonya Wise, female    DOB: 1941-04-07, 70 y.o.   MRN: [id number]  HPI 69/F , never smoker for evaluation of chronic cough since may'12. She developed a sinus infection in may'12 followed by cough productive of gree phlegm, initially given z-pak, when this persisted in 6/12 she was given depo medrol 80, ceftin & hycodan syrup prior to a trip to the beach. Nexium samples were taken x 15 ds. Since cough persisted, prednsione dosepak was prescribed on 8/10, tussionex has not been filled yet. CXR was nml - reviewed. She denies diurnal variation, no seasonal allergies, sinus congestion is better, phlegm is green - brown, occasional wheeze after coughing paroxysm, denies obvious heartburn. Environment - no obvious mold, house was treated 6 yrs ago for water exposure, no pets or new carpets.   Review of Systems  Constitutional: Positive for fever. Negative for unexpected weight change.  HENT: Positive for congestion, rhinorrhea, sneezing, trouble swallowing and postnasal drip. Negative for ear pain, nosebleeds, sore throat, dental problem and sinus pressure.   Eyes: Negative for redness and itching.  Respiratory: Positive for cough, chest tightness, shortness of breath and wheezing.   Cardiovascular: Negative for palpitations and leg swelling.  Gastrointestinal: Negative for nausea and vomiting.  Genitourinary: Negative for dysuria.  Musculoskeletal: Negative for joint swelling.  Skin: Negative for rash.  Neurological: Negative for headaches.  Hematological: Bruises/bleeds easily.  Psychiatric/Behavioral: Negative for dysphoric mood. The patient is not nervous/anxious.        Objective:   Physical Exam Gen. Pleasant, well-nourished, in no distress, normal affect ENT - no lesions, no post nasal drip Neck: No JVD, no thyromegaly, no carotid bruits Lungs: no use of accessory muscles, no dullness to percussion, clear without rales or rhonchi  Cardiovascular:  Rhythm regular, heart sounds  normal, no murmurs or gallops, no peripheral edema Abdomen: soft and non-tender, no hepatosplenomegaly, BS normal. Musculoskeletal: No deformities, no cyanosis or clubbing Neuro:  alert, non focal        Assessment & Plan:

## 2011-03-15 ENCOUNTER — Institutional Professional Consult (permissible substitution): Payer: Medicare Other | Admitting: Internal Medicine

## 2011-04-12 ENCOUNTER — Ambulatory Visit (INDEPENDENT_AMBULATORY_CARE_PROVIDER_SITE_OTHER): Payer: Medicare Other | Admitting: Pulmonary Disease

## 2011-04-12 ENCOUNTER — Encounter: Payer: Self-pay | Admitting: Pulmonary Disease

## 2011-04-12 VITALS — BP 120/72 | HR 81 | Temp 98.0°F | Ht 63.0 in | Wt 173.6 lb

## 2011-04-12 DIAGNOSIS — R053 Chronic cough: Secondary | ICD-10-CM

## 2011-04-12 DIAGNOSIS — Z23 Encounter for immunization: Secondary | ICD-10-CM

## 2011-04-12 DIAGNOSIS — R05 Cough: Secondary | ICD-10-CM

## 2011-04-12 MED ORDER — MOXIFLOXACIN HCL 400 MG PO TABS: 400.0000 mg | ORAL_TABLET | Freq: Every day | ORAL | Status: AC

## 2011-04-12 NOTE — Progress Notes (Signed)
Addended by: Julaine Hua on: 04/12/2011 05:36 PM   Modules accepted: Orders

## 2011-04-12 NOTE — Progress Notes (Signed)
Addended by: Modesto Charon on: 04/12/2011 02:42 PM   Modules accepted: Orders

## 2011-04-12 NOTE — Assessment & Plan Note (Signed)
FLu shot STOP Qvar Hycodan cough syrup  Antibiotic (avelox) x 10 days If no better, call & we will proceed with scan of sinuses Stay on zyrtec-D & omeprazole 

## 2011-04-12 NOTE — Patient Instructions (Addendum)
FLu shot STOP Qvar Hycodan cough syrup  Antibiotic (avelox) x 10 days If no better, call & we will proceed with scan of sinuses Stay on zyrtec-D & omeprazole

## 2011-04-12 NOTE — Progress Notes (Signed)
  Subjective:    Patient ID: Tonya Wise, female    DOB: 1940-12-05, 70 y.o.   MRN: [id number]  HPI 69/F , never smoker for evaluation of chronic cough since may'12. She developed a sinus infection in may'12 followed by cough productive of gree phlegm, initially given z-pak, when this persisted in 6/12 she was given depo medrol 80, ceftin & hycodan syrup prior to a trip to the beach. Nexium samples were taken x 15 ds. Since cough persisted, prednsione dosepak was prescribed on 8/10, tussionex has not been filled yet. CXR was nml - reviewed. She denies diurnal variation, no seasonal allergies, sinus congestion is better, phlegm is green - brown, occasional wheeze after coughing paroxysm, denies obvious heartburn. Environment - no obvious mold, house was treated 6 yrs ago for water exposure, no pets or new carpets. >> Appears post bronchitic, perhaps triggered by sino-bronchitis  Perpetuating factors may include upper airway and/ or GERD  Take Qvar 80 2 puffs daily- RINSE mouth after  Complete prednisone dosepak.  Tussionex ok - NO DRIVING - Alternatively, can use DELSYM (OTC) as cough syrup  Stay on ZYRTEC-D & prilosec  If persistent x 1 month, consider CT sinuses & allergy testing   04/12/2011 Cough 60% better, but still has green phlegm on occasion, nasal voice Qvar makes cough worse No breakthrough reflux or wheezing   Review of Systems Patient denies significant dyspnea,cough, hemoptysis,  chest pain, palpitations, pedal edema, orthopnea, paroxysmal nocturnal dyspnea, lightheadedness, nausea, vomiting, abdominal or  leg pains      Objective:   Physical Exam Gen. Pleasant, well-nourished, in no distress ENT - no lesions, no post nasal drip Neck: No JVD, no thyromegaly, no carotid bruits Lungs: no use of accessory muscles, no dullness to percussion, clear without rales or rhonchi  Cardiovascular: Rhythm regular, heart sounds  normal, no murmurs or gallops, no peripheral  edema Musculoskeletal: No deformities, no cyanosis or clubbing         Assessment & Plan:

## 2011-04-13 ENCOUNTER — Telehealth: Payer: Self-pay | Admitting: Pulmonary Disease

## 2011-04-13 MED ORDER — HYDROCODONE-HOMATROPINE 5-1.5 MG/5ML PO SYRP: 10.0000 mL | ORAL_SOLUTION | Freq: Two times a day (BID) | ORAL | Status: AC | PRN

## 2011-04-13 NOTE — Telephone Encounter (Signed)
Called and spoke with the pharmacy and they stated that the pts cough meds are ready to be picked up.  Called and spoke with pt and she is aware of this.

## 2011-04-13 NOTE — Progress Notes (Signed)
Addended by: Julaine Hua on: 04/13/2011 12:18 PM   Modules accepted: Orders

## 2011-04-18 ENCOUNTER — Telehealth: Payer: Self-pay | Admitting: Family Medicine

## 2011-04-18 ENCOUNTER — Other Ambulatory Visit: Payer: Self-pay | Admitting: Family Medicine

## 2011-04-18 MED ORDER — BISOPROLOL-HYDROCHLOROTHIAZIDE 10-6.25 MG PO TABS: 1.0000 | ORAL_TABLET | Freq: Every day | ORAL | Status: DC

## 2011-04-18 MED ORDER — AMLODIPINE BESYLATE 5 MG PO TABS: 5.0000 mg | ORAL_TABLET | Freq: Every day | ORAL | Status: DC

## 2011-04-18 NOTE — Telephone Encounter (Signed)
Blood pressure meds were called in

## 2011-04-19 ENCOUNTER — Telehealth: Payer: Self-pay | Admitting: Medical

## 2011-04-19 MED ORDER — OMEPRAZOLE MAGNESIUM 20 MG PO TBEC: 20.0000 mg | DELAYED_RELEASE_TABLET | Freq: Every day | ORAL | Status: DC

## 2011-04-19 NOTE — Telephone Encounter (Signed)
Prescription was sent in to pharmacy, omeprazole 20 mg #30 with 2 refills.  CM,LPN

## 2011-05-16 ENCOUNTER — Encounter: Payer: Self-pay | Admitting: Family Medicine

## 2011-08-27 ENCOUNTER — Encounter: Payer: Self-pay | Admitting: Family Medicine

## 2011-08-27 ENCOUNTER — Ambulatory Visit (INDEPENDENT_AMBULATORY_CARE_PROVIDER_SITE_OTHER): Payer: Medicare Other | Admitting: Family Medicine

## 2011-08-27 VITALS — BP 140/80 | HR 68 | Ht 62.0 in | Wt 172.0 lb

## 2011-08-27 DIAGNOSIS — R197 Diarrhea, unspecified: Secondary | ICD-10-CM | POA: Diagnosis not present

## 2011-08-27 NOTE — Progress Notes (Signed)
  Subjective:    Patient ID: KENNICE FINNIE, female    DOB: October 10, 1940, 71 y.o.   MRN: [id number]  HPI She has a 2 history of diarrhea but no fever, chills, blood or pus in the stools. She does have some mild nausea with bloating. She has been using TUMS for an indigestion feeling. She has not been on any new medications, traveled anywhere or anything any different. She did stop her Prilosec optimally 3 weeks ago with no effect. She cannot relate this to any particular foods. She has several stools per day including 3 or 4 of him at night. She has not lost any weight.   Review of Systems     Objective:   Physical Exam Alert and in no distress. Abdominal exam shows active bowel sounds without masses tenderness or hepatosplenomegaly. Cardiac and lung exam normal.       Assessment & Plan:   1. Diarrhea    we will start initially with Reuel Boom GERD as well as Imodium as many as 8 per day. If no improvement, blood work as well as stool evaluation will be done before possibly referring to GI she may also try Maalox or Mylanta for her occasional indigestion symptoms.

## 2011-08-27 NOTE — Patient Instructions (Signed)
Avoid milk and milk products. Use Dannon  yogurt or the next several days. He can also use Imodium up to 8 pills per day Use Maalox or Mylanta liquid for the indigestion

## 2011-09-03 ENCOUNTER — Encounter: Payer: Self-pay | Admitting: Family Medicine

## 2011-09-03 ENCOUNTER — Ambulatory Visit (INDEPENDENT_AMBULATORY_CARE_PROVIDER_SITE_OTHER): Payer: Medicare Other | Admitting: Family Medicine

## 2011-09-03 VITALS — BP 120/70 | Temp 98.1°F | Ht 62.0 in | Wt 169.0 lb

## 2011-09-03 DIAGNOSIS — R197 Diarrhea, unspecified: Secondary | ICD-10-CM

## 2011-09-03 MED ORDER — METRONIDAZOLE 500 MG PO TABS: 500.0000 mg | ORAL_TABLET | Freq: Three times a day (TID) | ORAL | Status: AC

## 2011-09-03 NOTE — Patient Instructions (Signed)
Get both of the stool specimens in, and then start on the medication.

## 2011-09-03 NOTE — Progress Notes (Signed)
  Subjective:    Patient ID: Tonya Wise, female    DOB: 12/22/1940, 71 y.o.   MRN: [id number]  HPI She is here for a consultation concerning continued difficulty with diarrhea. A review of her record  indicates that she has had this for 2 months. Dannon  yogurt and Imodium were recommended. She obtained no relief with this regimen. He is now having difficulty with nausea and vomiting. He did have a fever of 100.5 yesterday no other family members have been sick. No blood, pus or foul-smelling stool.   Review of Systems     Objective:   Physical Exam alert and in no distress. Tympanic membranes and canals are normal. Throat is clear. Tonsils are normal. Neck is supple without adenopathy or thyromegaly. Cardiac exam shows a regular sinus rhythm without murmurs or gallops. Lungs are clear to auscultation. Nominal exam shows active bowel sounds no masses but some tenderness especially in the right mid quadrant. No rebound        Assessment & Plan:  Nausea, vomiting and diarrhea, etiology unclear. We'll send for stool for O. and P., culture, CBC. Instructed her on starting Flagyl after she gets the specimen was delivered.

## 2011-09-04 ENCOUNTER — Other Ambulatory Visit: Payer: Self-pay | Admitting: Family Medicine

## 2011-09-04 DIAGNOSIS — R197 Diarrhea, unspecified: Secondary | ICD-10-CM | POA: Diagnosis not present

## 2011-09-04 LAB — STOOL CELLS, WBC & RBC: RBC/40X FIELD:: 0

## 2011-09-05 LAB — OVA AND PARASITE EXAMINATION
OP: NONE SEEN
OP: NONE SEEN

## 2011-09-06 LAB — STOOL CELLS, WBC & RBC: RBC/40X FIELD:: 0

## 2011-09-08 LAB — STOOL CULTURE

## 2011-10-01 ENCOUNTER — Telehealth: Payer: Self-pay | Admitting: Family Medicine

## 2011-10-01 NOTE — Telephone Encounter (Signed)
Go ahead and make this referral and send my notes as well as lab data

## 2011-10-01 NOTE — Telephone Encounter (Signed)
Pt has appt with dr.hung 3/5 @ 3:45

## 2011-10-02 DIAGNOSIS — R197 Diarrhea, unspecified: Secondary | ICD-10-CM | POA: Diagnosis not present

## 2011-10-11 ENCOUNTER — Encounter: Payer: Self-pay | Admitting: Internal Medicine

## 2011-10-11 DIAGNOSIS — K2970 Gastritis, unspecified, without bleeding: Secondary | ICD-10-CM | POA: Diagnosis not present

## 2011-10-11 DIAGNOSIS — R197 Diarrhea, unspecified: Secondary | ICD-10-CM | POA: Diagnosis not present

## 2011-10-11 DIAGNOSIS — R634 Abnormal weight loss: Secondary | ICD-10-CM | POA: Diagnosis not present

## 2011-10-11 DIAGNOSIS — K2940 Chronic atrophic gastritis without bleeding: Secondary | ICD-10-CM | POA: Diagnosis not present

## 2011-10-11 DIAGNOSIS — K219 Gastro-esophageal reflux disease without esophagitis: Secondary | ICD-10-CM | POA: Diagnosis not present

## 2011-10-11 DIAGNOSIS — K5289 Other specified noninfective gastroenteritis and colitis: Secondary | ICD-10-CM | POA: Diagnosis not present

## 2011-10-11 DIAGNOSIS — K2990 Gastroduodenitis, unspecified, without bleeding: Secondary | ICD-10-CM | POA: Diagnosis not present

## 2011-10-11 DIAGNOSIS — K209 Esophagitis, unspecified without bleeding: Secondary | ICD-10-CM | POA: Diagnosis not present

## 2011-10-11 DIAGNOSIS — K6389 Other specified diseases of intestine: Secondary | ICD-10-CM | POA: Diagnosis not present

## 2011-10-11 DIAGNOSIS — K319 Disease of stomach and duodenum, unspecified: Secondary | ICD-10-CM | POA: Diagnosis not present

## 2011-10-11 DIAGNOSIS — K259 Gastric ulcer, unspecified as acute or chronic, without hemorrhage or perforation: Secondary | ICD-10-CM | POA: Diagnosis not present

## 2011-10-11 LAB — HM COLONOSCOPY: HM Colonoscopy: NORMAL

## 2011-11-28 ENCOUNTER — Encounter: Payer: Self-pay | Admitting: Gastroenterology

## 2012-01-23 DIAGNOSIS — K219 Gastro-esophageal reflux disease without esophagitis: Secondary | ICD-10-CM | POA: Diagnosis not present

## 2012-01-23 DIAGNOSIS — R197 Diarrhea, unspecified: Secondary | ICD-10-CM | POA: Diagnosis not present

## 2012-03-26 ENCOUNTER — Telehealth: Payer: Self-pay | Admitting: Internal Medicine

## 2012-03-26 MED ORDER — AMLODIPINE BESYLATE 5 MG PO TABS: 5.0000 mg | ORAL_TABLET | Freq: Every day | ORAL | Status: DC

## 2012-03-26 NOTE — Telephone Encounter (Signed)
Med sent in.

## 2012-04-23 ENCOUNTER — Other Ambulatory Visit: Payer: Self-pay | Admitting: Medical

## 2012-04-23 ENCOUNTER — Telehealth: Payer: Self-pay | Admitting: Family Medicine

## 2012-04-23 ENCOUNTER — Other Ambulatory Visit: Payer: Self-pay | Admitting: Family Medicine

## 2012-04-23 MED ORDER — BISOPROLOL-HYDROCHLOROTHIAZIDE 10-6.25 MG PO TABS: 1.0000 | ORAL_TABLET | Freq: Every day | ORAL | Status: DC

## 2012-04-23 NOTE — Telephone Encounter (Signed)
Pt states she needs refill on ziac.  She is leaving for beach and if an appointment is needed she will make one when she gets back. Pt uses [facility] [address]. Call when done.

## 2012-04-23 NOTE — Telephone Encounter (Signed)
ziac refilled.  She is due for f/u.

## 2012-04-24 NOTE — Telephone Encounter (Signed)
PT INFORMED, SHE WILL MAKE APPT AFTER SHE GETS BACK FROM [facility]

## 2012-04-25 ENCOUNTER — Other Ambulatory Visit: Payer: Medicare Other

## 2012-04-25 DIAGNOSIS — Z23 Encounter for immunization: Secondary | ICD-10-CM

## 2012-04-25 MED ORDER — INFLUENZA VIRUS VACC SPLIT PF IM SUSP: 0.5000 mL | Freq: Once | INTRAMUSCULAR | Status: DC

## 2012-04-28 ENCOUNTER — Other Ambulatory Visit: Payer: Medicare Other

## 2012-05-28 ENCOUNTER — Other Ambulatory Visit: Payer: Self-pay | Admitting: Family Medicine

## 2012-05-28 MED ORDER — BISOPROLOL-HYDROCHLOROTHIAZIDE 10-6.25 MG PO TABS: 1.0000 | ORAL_TABLET | Freq: Every day | ORAL | Status: DC

## 2012-06-02 ENCOUNTER — Ambulatory Visit (INDEPENDENT_AMBULATORY_CARE_PROVIDER_SITE_OTHER): Payer: Medicare Other | Admitting: Family Medicine

## 2012-06-02 ENCOUNTER — Encounter: Payer: Self-pay | Admitting: Family Medicine

## 2012-06-02 VITALS — BP 124/80 | HR 64 | Wt 177.0 lb

## 2012-06-02 DIAGNOSIS — M8580 Other specified disorders of bone density and structure, unspecified site: Secondary | ICD-10-CM | POA: Insufficient documentation

## 2012-06-02 DIAGNOSIS — I10 Essential (primary) hypertension: Secondary | ICD-10-CM | POA: Insufficient documentation

## 2012-06-02 DIAGNOSIS — Z79899 Other long term (current) drug therapy: Secondary | ICD-10-CM

## 2012-06-02 DIAGNOSIS — M949 Disorder of cartilage, unspecified: Secondary | ICD-10-CM

## 2012-06-02 DIAGNOSIS — M1990 Unspecified osteoarthritis, unspecified site: Secondary | ICD-10-CM

## 2012-06-02 DIAGNOSIS — M129 Arthropathy, unspecified: Secondary | ICD-10-CM | POA: Diagnosis not present

## 2012-06-02 DIAGNOSIS — N289 Disorder of kidney and ureter, unspecified: Secondary | ICD-10-CM | POA: Diagnosis not present

## 2012-06-02 DIAGNOSIS — M899 Disorder of bone, unspecified: Secondary | ICD-10-CM | POA: Diagnosis not present

## 2012-06-02 MED ORDER — BISOPROLOL-HYDROCHLOROTHIAZIDE 10-6.25 MG PO TABS: 1.0000 | ORAL_TABLET | Freq: Every day | ORAL | Status: DC

## 2012-06-02 NOTE — Progress Notes (Signed)
  Subjective:    Patient ID: Tonya Wise, female    DOB: [birth year], 71 y.o.   MRN: [id number]  HPI She is here for medication check. She continues on medications listed in the chart. She's had no difficulties with these. Her main complaint today is difficulty with arthritis in her fingers. This bothers her more in the morning and does get better as the day goes on. She remains physically active. She and her husband recently [place] They were there for one month to   Review of Systems     Objective:   Physical Exam alert and in no distress. Tympanic membranes and canals are normal. Throat is clear. Tonsils are normal. Neck is supple without adenopathy or thyromegaly. Cardiac exam shows a regular sinus rhythm without murmurs or gallops. Lungs are clear to auscultation. DTRs normal        Assessment & Plan:   1. Hypertension  CBC with Differential, Comprehensive metabolic panel  2. Arthritis  CBC with Differential, Comprehensive metabolic panel  3. Osteopenia  Vitamin D 25 hydroxy  4. Renal insufficiency  CBC with Differential, Comprehensive metabolic panel  5. Encounter for long-term (current) use of other medications     encouraged her to continue to take good care of herself. Also recommend using Tylenol to help with her arthritis symptoms.

## 2012-06-02 NOTE — Patient Instructions (Signed)
Start with Tylenol and tried taking some the night before you go to bed and see if it helps with the morning. You can take 2 tablets 4 times a day. That doesn't work switch to  Advil or [facility]

## 2012-06-03 LAB — CBC WITH DIFFERENTIAL/PLATELET
Basophils Absolute: 0 10*3/uL (ref 0.0–0.1)
Basophils Relative: 0 % (ref 0–1)
Eosinophils Absolute: 0.1 10*3/uL (ref 0.0–0.7)
Eosinophils Relative: 2 % (ref 0–5)
MCH: 27.6 pg (ref 26.0–34.0)
MCV: 80.9 fL (ref 78.0–100.0)
Neutrophils Relative %: 58 % (ref 43–77)
Platelets: 124 10*3/uL — ABNORMAL LOW (ref 150–400)
RBC: 4.92 MIL/uL (ref 3.87–5.11)
RDW: 14.6 % (ref 11.5–15.5)

## 2012-06-03 LAB — COMPREHENSIVE METABOLIC PANEL
ALT: 13 U/L (ref 0–35)
AST: 19 U/L (ref 0–37)
Alkaline Phosphatase: 63 U/L (ref 39–117)
CO2: 25 mEq/L (ref 19–32)
Creat: 0.98 mg/dL (ref 0.50–1.10)
Sodium: 142 mEq/L (ref 135–145)
Total Bilirubin: 0.5 mg/dL (ref 0.3–1.2)
Total Protein: 5.9 g/dL — ABNORMAL LOW (ref 6.0–8.3)

## 2012-06-03 LAB — VITAMIN D 25 HYDROXY (VIT D DEFICIENCY, FRACTURES): Vit D, 25-Hydroxy: 62 ng/mL (ref 30–89)

## 2012-06-03 NOTE — Progress Notes (Signed)
Quick Note:  Called pt informed her of her labs she verbalized understanding ______

## 2012-11-25 ENCOUNTER — Encounter: Payer: Self-pay | Admitting: Family Medicine

## 2012-11-25 ENCOUNTER — Ambulatory Visit (INDEPENDENT_AMBULATORY_CARE_PROVIDER_SITE_OTHER): Payer: Medicare Other | Admitting: Family Medicine

## 2012-11-25 VITALS — BP 120/80 | HR 70 | Temp 97.8°F | Wt 175.0 lb

## 2012-11-25 DIAGNOSIS — J069 Acute upper respiratory infection, unspecified: Secondary | ICD-10-CM

## 2012-11-25 MED ORDER — HYDROCOD POLST-CHLORPHEN POLST 10-8 MG/5ML PO LQCR: 5.0000 mL | Freq: Two times a day (BID) | ORAL | Status: DC | PRN

## 2012-11-25 NOTE — Progress Notes (Signed)
  Subjective:    Patient ID: Tonya Wise, female    DOB: 08-Sep-1940, 72 y.o.   MRN: [id number]  HPI She has a two week history of sore throat,malaise PND,sinus congestion, cough . No fever chills or headache. She does not smoke   Review of Systems     Objective:   Physical Exam alert and in no distress. Tympanic membranes and canals are normal. Throat is clear. Tonsils are normal. Neck is supple without adenopathy or thyromegaly. Cardiac exam shows a regular sinus rhythm without murmurs or gallops. Lungs are clear to auscultation.        Assessment & Plan:  URI, acute - Plan: chlorpheniramine-HYDROcodone (TUSSIONEX PENNKINETIC ER) 10-8 MG/5ML [facility]

## 2012-12-31 ENCOUNTER — Encounter: Payer: Self-pay | Admitting: Family Medicine

## 2012-12-31 ENCOUNTER — Ambulatory Visit (INDEPENDENT_AMBULATORY_CARE_PROVIDER_SITE_OTHER): Payer: Medicare Other | Admitting: Family Medicine

## 2012-12-31 VITALS — BP 130/74 | HR 70 | Wt 171.0 lb

## 2012-12-31 DIAGNOSIS — J209 Acute bronchitis, unspecified: Secondary | ICD-10-CM

## 2012-12-31 MED ORDER — CLARITHROMYCIN 500 MG PO TABS: 500.0000 mg | ORAL_TABLET | Freq: Two times a day (BID) | ORAL | Status: DC

## 2012-12-31 NOTE — Patient Instructions (Addendum)
Take all the antibiotic and if not totally back to normal call me and I will give you more. Do not settle for better! Try NyQuil at night

## 2012-12-31 NOTE — Progress Notes (Signed)
  Subjective:    Patient ID: Tonya Wise, female    DOB: [birth year], 72 y.o.   MRN: [id number]  HPI She is here for evaluation of continued difficulty with cough. It's been going on for approximately 2 months. She does have occasional difficulty with productive cough but no fever, chills, chest congestion. She does not smoke.   Review of Systems     Objective:   Physical Exam alert and in no distress. Tympanic membranes and canals are normal. Throat is clear. Tonsils are normal. Neck is supple without adenopathy or thyromegaly. Cardiac exam shows a regular sinus rhythm without murmurs or gallops. Lungs are clear to auscultation.        Assessment & Plan:  Acute bronchitis - Plan: clarithromycin (BIAXIN) 500 MG tablet she will call at the end of the course of still having difficulty.

## 2013-01-14 ENCOUNTER — Telehealth: Payer: Self-pay | Admitting: Internal Medicine

## 2013-01-14 MED ORDER — BENZONATATE 100 MG PO CAPS: 100.0000 mg | ORAL_CAPSULE | Freq: Three times a day (TID) | ORAL | Status: DC | PRN

## 2013-01-14 MED ORDER — OMEPRAZOLE 40 MG PO CPDR: 40.0000 mg | DELAYED_RELEASE_CAPSULE | Freq: Every day | ORAL | Status: DC

## 2013-01-14 NOTE — Telephone Encounter (Signed)
Pt still has a dry cough and would like something to take during the day when she has her coughing spells. Pt also needs omeprazole 40mg  that Dr. Elnoria Howard prescribed for her but wants you to refill it here.  Send to [facility]

## 2013-02-23 ENCOUNTER — Ambulatory Visit (INDEPENDENT_AMBULATORY_CARE_PROVIDER_SITE_OTHER): Payer: Medicare Other | Admitting: Family Medicine

## 2013-02-23 ENCOUNTER — Telehealth: Payer: Self-pay | Admitting: Family Medicine

## 2013-02-23 ENCOUNTER — Encounter: Payer: Self-pay | Admitting: Family Medicine

## 2013-02-23 VITALS — BP 120/70 | HR 80 | Temp 98.6°F | Wt 166.0 lb

## 2013-02-23 DIAGNOSIS — J019 Acute sinusitis, unspecified: Secondary | ICD-10-CM | POA: Diagnosis not present

## 2013-02-23 DIAGNOSIS — J209 Acute bronchitis, unspecified: Secondary | ICD-10-CM | POA: Diagnosis not present

## 2013-02-23 MED ORDER — AZITHROMYCIN 500 MG PO TABS: 500.0000 mg | ORAL_TABLET | Freq: Every day | ORAL | Status: DC

## 2013-02-23 NOTE — Telephone Encounter (Signed)
Pt called and said same old sinus stuff she had in June, cough, congestion, will you please call in [facility] [address]  Pt does not want the Biaxin as it leaves a bad after taste.  Pt can be reached at  [phone]

## 2013-02-23 NOTE — Progress Notes (Signed)
  Subjective:    Patient ID: Tonya Wise, female    DOB: 11-Nov-1940, 72 y.o.   MRN: [id number]  HPI She has a one-week history that started with nasal congestion, dry cough, sore throat. The symptoms progressed to getting much worse about 3 days ago with increasing congestion, intermittently productive cough sinus pain and pressure with her real and postnasal drainage. She did have chills over the weekend but no earache. She does not smoke and has no allergies.   Review of Systems     Objective:   Physical Exam alert and in no distress. Tympanic membranes and canals are normal. Throat is clear. Tonsils are normal. Neck is supple without adenopathy or thyromegaly. Cardiac exam shows a regular sinus rhythm without murmurs or gallops. Lungs are clear to auscultation. Nasal mucosa was red with tenderness over maxillary sinuses.       Assessment & Plan:  Acute sinusitis - Plan: azithromycin (ZITHROMAX) 500 MG tablet  Acute bronchitis - Plan: azithromycin (ZITHROMAX) 500 MG tablet  instructions were given to repeat the azithromycin in 10 days if not totally back to normal.

## 2013-02-23 NOTE — Patient Instructions (Signed)
Take the antibiotic as directed but if you're not totally back to normal in 10 days get it refilled

## 2013-03-02 ENCOUNTER — Telehealth: Payer: Self-pay | Admitting: Family Medicine

## 2013-03-02 NOTE — Telephone Encounter (Signed)
Pt advised to refill med

## 2013-03-02 NOTE — Telephone Encounter (Signed)
Tell her to refill it

## 2013-03-12 ENCOUNTER — Ambulatory Visit (INDEPENDENT_AMBULATORY_CARE_PROVIDER_SITE_OTHER): Payer: Medicare Other | Admitting: Pulmonary Disease

## 2013-03-12 ENCOUNTER — Encounter: Payer: Self-pay | Admitting: Pulmonary Disease

## 2013-03-12 ENCOUNTER — Telehealth: Payer: Self-pay | Admitting: Pulmonary Disease

## 2013-03-12 ENCOUNTER — Ambulatory Visit (INDEPENDENT_AMBULATORY_CARE_PROVIDER_SITE_OTHER)
Admission: RE | Admit: 2013-03-12 | Discharge: 2013-03-12 | Disposition: A | Discharge status: Home / Self Care | Payer: Medicare Other | Source: Ambulatory Visit | Attending: Pulmonary Disease | Admitting: Pulmonary Disease

## 2013-03-12 VITALS — BP 122/84 | HR 98 | Temp 99.4°F | Ht 62.0 in | Wt 166.2 lb

## 2013-03-12 DIAGNOSIS — R059 Cough, unspecified: Secondary | ICD-10-CM

## 2013-03-12 DIAGNOSIS — R05 Cough: Secondary | ICD-10-CM | POA: Diagnosis not present

## 2013-03-12 DIAGNOSIS — J42 Unspecified chronic bronchitis: Secondary | ICD-10-CM | POA: Diagnosis not present

## 2013-03-12 MED ORDER — PREDNISONE 5 MG PO TABS: ORAL_TABLET | ORAL | Status: DC

## 2013-03-12 NOTE — Telephone Encounter (Signed)
I spoke with RA regarding pt. She is scheduled to come in and see him at 3:00 today. Nothing further needed

## 2013-03-12 NOTE — Progress Notes (Signed)
  Subjective:    Patient ID: Tonya Wise, female    DOB: [birth year], 72 y.o.   MRN: [id number]  HPI PCP - Susann Givens  71/F , never smoker for evaluation of acute cough    Initial OV 12/2010  She developed a sinus infection in may'12 followed by cough productive of green phlegm, initially given z-pak, when this persisted in 6/12 CXR was nml    >> Appeared post bronchitic, perhaps triggered by sino-bronchitis  Perpetuating factors may include upper airway and/ or GERD  Improved with  ZYRTEC-D & prilosec     03/12/2013 2 y FU  She returns c/o dry cough x 10ds , chest hurts from coughing, wheezing,Pt was recently treated for bronchitis and sinusitis but not getting better Initially given biaxin - tussionex, benzonatate Then azithro x 2 rounds She is concerned about recurrence like 2 yrs ago Again no tickle in throat or post nasal drip or heartburn No exposures at home  CXR -no infx/ effusion  Review of Systems  neg for any significant sore throat, dysphagia, itching, sneezing, nasal congestion or excess/ purulent secretions, fever, chills, sweats, unintended wt loss, pleuritic or exertional cp, hempoptysis, orthopnea pnd or change in chronic leg swelling. Also denies presyncope, palpitations, heartburn, abdominal pain, nausea, vomiting, diarrhea or change in bowel or urinary habits, dysuria,hematuria, rash, arthralgias, visual complaints, headache, numbness weakness or ataxia.     Objective:   Physical Exam  Gen. Pleasant, well-nourished, in no distress ENT - no lesions, no post nasal drip Neck: No JVD, no thyromegaly, no carotid bruits Lungs: no use of accessory muscles, no dullness to percussion, clear without rales or rhonchi  Cardiovascular: Rhythm regular, heart sounds  normal, no murmurs or gallops, no peripheral edema Musculoskeletal: No deformities, no cyanosis or clubbing         Assessment & Plan:

## 2013-03-12 NOTE — Assessment & Plan Note (Signed)
Now sub acute, perhaps triggered by sino-bronchitis  CXR today Prednisone 5 mg tabs  Take 2 tabs daily with food x 5ds, then 1 tab daily with food x 5ds then STOP DELSYM 5 ml po thrice daily for cough Zyrtec-D daily x 2 weeks Call if no better in 3-4 wks

## 2013-03-12 NOTE — Patient Instructions (Addendum)
CXR today Prednisone 5 mg tabs  Take 2 tabs daily with food x 5ds, then 1 tab daily with food x 5ds then STOP DELSYM 5 ml po thrice daily for cough Zyrtec-D daily x 2 weeks Call if no better in 3-4 wks

## 2013-03-26 ENCOUNTER — Encounter: Payer: Self-pay | Admitting: Family Medicine

## 2013-03-26 ENCOUNTER — Ambulatory Visit (INDEPENDENT_AMBULATORY_CARE_PROVIDER_SITE_OTHER): Payer: Medicare Other | Admitting: Family Medicine

## 2013-03-26 VITALS — BP 116/64 | HR 108 | Temp 98.2°F | Ht 62.0 in | Wt 157.0 lb

## 2013-03-26 DIAGNOSIS — R42 Dizziness and giddiness: Secondary | ICD-10-CM

## 2013-03-26 DIAGNOSIS — R5383 Other fatigue: Secondary | ICD-10-CM | POA: Diagnosis not present

## 2013-03-26 DIAGNOSIS — R5381 Other malaise: Secondary | ICD-10-CM | POA: Diagnosis not present

## 2013-03-26 DIAGNOSIS — R112 Nausea with vomiting, unspecified: Secondary | ICD-10-CM

## 2013-03-26 DIAGNOSIS — E860 Dehydration: Secondary | ICD-10-CM

## 2013-03-26 DIAGNOSIS — B370 Candidal stomatitis: Secondary | ICD-10-CM

## 2013-03-26 MED ORDER — ONDANSETRON HCL 8 MG PO TABS: 8.0000 mg | ORAL_TABLET | Freq: Three times a day (TID) | ORAL | Status: DC | PRN

## 2013-03-26 MED ORDER — NYSTATIN 100000 UNIT/ML MT SUSP: 500000.0000 [IU] | Freq: Four times a day (QID) | OROMUCOSAL | Status: DC

## 2013-03-26 NOTE — Patient Instructions (Signed)
Drink plenty of fluids.   Use the zofran as needed for nausea, so that you can stay well hydrated. Use the nystatin four times daily (swish, gargle and spit).  I'm not convinced that this is thrush--could just be coated from being sick, dehydrated, but you have risk factors for why you could get a yeast infection in the mouth (antibiotics, steroids), and it doesn't hurt to try this treatment.  We will be in touch with your labwork soon.

## 2013-03-26 NOTE — Progress Notes (Signed)
Chief Complaint  Patient presents with  . Dizziness    and lightheadness, nausea, SOB, weakness, daytime drowsiness, states that her tongue is "white-coated" and has a funny taste in her mouth. Saw Dr.Lalonde 6/4 and 7/28 was treated with Biaxin and then with Zpak 8/14 as well as a prednisone pack from Dr.Alva on 8/14. She has had these symptoms since first starting the first course of Biaxin and feels that with each course of abx her symtoms have worsened. Has had multiple falls bc of the way she feels.   . Labs Only    would really like to have some sort of bloodwork done today if possible.    Started out with bronchitis back in June.  Has had 2 courses of antibiotics and a course of steroids since then.  Her cough has improved some, but hasn't felt well since she started with all of these medications. She has been feeling worse since after the steroid prescription. She was prescribed prednisone on 8/14, and completed the course 4 days ago.  She is incredibly fatigued, hardly able to get around.  She has no appetite, food tastes bad.  She is very sleepy.  Tongue has a white coating, but isn't painful, whole mouth tastes bad.  Feels wobbly, sometimes has some vertigo/spinning when walking.  She has had some falls; this has been ongoing since she started with this illness in June/July.  She vomited twice yesterday, vomiting usually about once per day since starting the steroids, mostly dry heaves.  Denies diarrhea.  Past Medical History  Diagnosis Date  . Hypertension   . Renal insufficiency   . Osteopenia   . Allergy   . Heart murmur   . GERD (gastroesophageal reflux disease)    Past Surgical History  Procedure Laterality Date  . Appendectomy    . Colonoscopy  2008  . Spine surgery     History   Social History  . Marital Status: Married    Spouse Name: N/A    Number of Children: N/A  . Years of Education: N/A   Occupational History  . Not on file.   Social History Main Topics  .  Smoking status: Former Smoker -- 0.50 packs/day for 2 years    Types: Cigarettes    Quit date: 07/30/1960  . Smokeless tobacco: Never Used  . Alcohol Use: Yes     Comment: rare-wine drinker  . Drug Use: No  . Sexual Activity: Yes    Partners: Male   Other Topics Concern  . Not on file   Social History Narrative  . No narrative on file   Current Outpatient Prescriptions on File Prior to Visit  Medication Sig Dispense Refill  . amLODipine (NORVASC) 5 MG tablet Take 1 tablet (5 mg total) by mouth daily.  30 tablet  12  . aspirin 81 MG tablet Take 81 mg by mouth daily.        . bisoprolol-hydrochlorothiazide (ZIAC) 10-6.25 MG per tablet Take 1 tablet by mouth daily.  30 tablet  11  . Calcium Carbonate-Vitamin D (CALCIUM + D PO) Take 1 tablet by mouth daily.        . Misc Natural Products (OSTEO BI-FLEX ADV TRIPLE ST PO) Take by mouth.        Marland Kitchen omeprazole (PRILOSEC) 40 MG capsule Take 1 capsule (40 mg total) by mouth daily.  30 capsule  11   No current facility-administered medications on file prior to visit.   Allergies  Allergen Reactions  .  Penicillins    ROS:  Denies fevers, chills, URI symptoms--cough is much improved.  +dizziness, nausea, vomiting.  No diarrhea, no skin rashes.  +bad taste in mouth. No headaches. No chest pain, edema, bleeding/bruising, abdominal pain, urinary complaints or other concerns except as noted in HPI  PHYSICAL EXAM: She had presyncopal episode and dry heaves during attempt at orthostatic VS. BP 100/70  Pulse 112  Temp(Src) 98.2 F (36.8 C) (Oral)  Ht 5\' 2"  (1.575 m)  Wt 157 lb (71.215 kg)  BMI 28.71 kg/m2  SpO2 96% 116/64 lying, RA; pulse 108 when rechecked by MD Somewhat pale appearing female, alert and oriented, in no distress HEENT:  PERRL, conjunctiva clear, anicteric sclera.  TM's and EAC's normal.  OP with white coating to tongue.  Mucous membranes are otherwise normal. No erythema Neck: no lymphadenopathy, thyromegaly Heart:  tachycardic.  No murmur, rub, gallop Lungs: clear bilaterally, no wheezes, rales, ronchi Abdomen: soft, nontender, no masses Extremities: no edema Skin: no rash.  Somewhat pale.    ASSESSMENT/PLAN:  Other malaise and fatigue - Plan: Comprehensive metabolic panel, CBC with Differential  Nausea and vomiting - Plan: ondansetron (ZOFRAN) 8 MG tablet  Dizziness and giddiness - Plan: Comprehensive metabolic panel, CBC with Differential  Dehydration - mild  Thrush - Plan: nystatin (MYCOSTATIN) 100000 UNIT/ML suspension  Possible thrush--some of the coating is likely related to dehydration.  But given the taste issues she is having, will treat for thrush. She is at risk given multiple antibiotics and recent steroids. Treat with zofran so she can adequately orally hydrate.  To ER if worsening dizziness, dehydration, for IV fluids  Bland diet--start with clears and advance as tolerated.    F/u prn.  Will be in contact with lab results when available

## 2013-03-27 ENCOUNTER — Encounter (HOSPITAL_COMMUNITY): Payer: Self-pay | Admitting: Emergency Medicine

## 2013-03-27 ENCOUNTER — Emergency Department (HOSPITAL_COMMUNITY): Payer: Medicare Other

## 2013-03-27 ENCOUNTER — Inpatient Hospital Stay (HOSPITAL_COMMUNITY)
Admission: EM | Admit: 2013-03-27 | Discharge: 2013-04-01 | DRG: 824 | Disposition: A | Discharge status: Home Health | Payer: Medicare Other | Attending: Internal Medicine | Admitting: Internal Medicine

## 2013-03-27 DIAGNOSIS — D500 Iron deficiency anemia secondary to blood loss (chronic): Secondary | ICD-10-CM | POA: Diagnosis present

## 2013-03-27 DIAGNOSIS — K7689 Other specified diseases of liver: Secondary | ICD-10-CM | POA: Diagnosis not present

## 2013-03-27 DIAGNOSIS — K922 Gastrointestinal hemorrhage, unspecified: Secondary | ICD-10-CM

## 2013-03-27 DIAGNOSIS — D509 Iron deficiency anemia, unspecified: Secondary | ICD-10-CM

## 2013-03-27 DIAGNOSIS — R599 Enlarged lymph nodes, unspecified: Secondary | ICD-10-CM | POA: Diagnosis present

## 2013-03-27 DIAGNOSIS — R161 Splenomegaly, not elsewhere classified: Secondary | ICD-10-CM | POA: Diagnosis not present

## 2013-03-27 DIAGNOSIS — Z6829 Body mass index (BMI) 29.0-29.9, adult: Secondary | ICD-10-CM

## 2013-03-27 DIAGNOSIS — M545 Low back pain, unspecified: Secondary | ICD-10-CM | POA: Diagnosis present

## 2013-03-27 DIAGNOSIS — Z87891 Personal history of nicotine dependence: Secondary | ICD-10-CM | POA: Diagnosis not present

## 2013-03-27 DIAGNOSIS — R0602 Shortness of breath: Secondary | ICD-10-CM | POA: Diagnosis not present

## 2013-03-27 DIAGNOSIS — M7989 Other specified soft tissue disorders: Secondary | ICD-10-CM | POA: Diagnosis not present

## 2013-03-27 DIAGNOSIS — D649 Anemia, unspecified: Secondary | ICD-10-CM | POA: Diagnosis not present

## 2013-03-27 DIAGNOSIS — R1013 Epigastric pain: Secondary | ICD-10-CM | POA: Diagnosis not present

## 2013-03-27 DIAGNOSIS — K828 Other specified diseases of gallbladder: Secondary | ICD-10-CM | POA: Diagnosis not present

## 2013-03-27 DIAGNOSIS — N182 Chronic kidney disease, stage 2 (mild): Secondary | ICD-10-CM | POA: Diagnosis present

## 2013-03-27 DIAGNOSIS — I129 Hypertensive chronic kidney disease with stage 1 through stage 4 chronic kidney disease, or unspecified chronic kidney disease: Secondary | ICD-10-CM | POA: Diagnosis present

## 2013-03-27 DIAGNOSIS — M129 Arthropathy, unspecified: Secondary | ICD-10-CM | POA: Diagnosis present

## 2013-03-27 DIAGNOSIS — D62 Acute posthemorrhagic anemia: Secondary | ICD-10-CM | POA: Diagnosis present

## 2013-03-27 DIAGNOSIS — I10 Essential (primary) hypertension: Secondary | ICD-10-CM | POA: Diagnosis not present

## 2013-03-27 DIAGNOSIS — R05 Cough: Secondary | ICD-10-CM

## 2013-03-27 DIAGNOSIS — E871 Hypo-osmolality and hyponatremia: Secondary | ICD-10-CM | POA: Diagnosis present

## 2013-03-27 DIAGNOSIS — R634 Abnormal weight loss: Secondary | ICD-10-CM | POA: Diagnosis present

## 2013-03-27 DIAGNOSIS — R509 Fever, unspecified: Secondary | ICD-10-CM

## 2013-03-27 DIAGNOSIS — N179 Acute kidney failure, unspecified: Secondary | ICD-10-CM | POA: Diagnosis present

## 2013-03-27 DIAGNOSIS — E440 Moderate protein-calorie malnutrition: Secondary | ICD-10-CM | POA: Diagnosis present

## 2013-03-27 DIAGNOSIS — C8589 Other specified types of non-Hodgkin lymphoma, extranodal and solid organ sites: Principal | ICD-10-CM | POA: Diagnosis present

## 2013-03-27 DIAGNOSIS — K219 Gastro-esophageal reflux disease without esophagitis: Secondary | ICD-10-CM | POA: Diagnosis not present

## 2013-03-27 DIAGNOSIS — M8580 Other specified disorders of bone density and structure, unspecified site: Secondary | ICD-10-CM

## 2013-03-27 DIAGNOSIS — M1990 Unspecified osteoarthritis, unspecified site: Secondary | ICD-10-CM

## 2013-03-27 DIAGNOSIS — N289 Disorder of kidney and ureter, unspecified: Secondary | ICD-10-CM

## 2013-03-27 DIAGNOSIS — C8595 Non-Hodgkin lymphoma, unspecified, lymph nodes of inguinal region and lower limb: Secondary | ICD-10-CM | POA: Diagnosis not present

## 2013-03-27 HISTORY — DX: Hypercalcemia: E83.52

## 2013-03-27 HISTORY — DX: Anemia, unspecified: D64.9

## 2013-03-27 LAB — URINALYSIS, ROUTINE W REFLEX MICROSCOPIC
Bilirubin Urine: NEGATIVE
Glucose, UA: NEGATIVE mg/dL
Ketones, ur: NEGATIVE mg/dL
Ketones, ur: NEGATIVE mg/dL
Leukocytes, UA: NEGATIVE
Nitrite: NEGATIVE
Protein, ur: NEGATIVE mg/dL
Specific Gravity, Urine: 1.005 (ref 1.005–1.030)
pH: 6 (ref 5.0–8.0)
pH: 6 (ref 5.0–8.0)

## 2013-03-27 LAB — COMPREHENSIVE METABOLIC PANEL
AST: 24 U/L (ref 0–37)
Alkaline Phosphatase: 210 U/L — ABNORMAL HIGH (ref 39–117)
BUN: 18 mg/dL (ref 6–23)
BUN: 17 mg/dL (ref 6–23)
Calcium: 9.9 mg/dL (ref 8.4–10.5)
Calcium: 11.2 mg/dL — ABNORMAL HIGH (ref 8.4–10.5)
Creat: 1.37 mg/dL — ABNORMAL HIGH (ref 0.50–1.10)
GFR calc Af Amer: 36 mL/min — ABNORMAL LOW (ref >90)
Glucose, Bld: 233 mg/dL — ABNORMAL HIGH (ref 70–99)
Total Bilirubin: 1 mg/dL (ref 0.3–1.2)
Total Protein: 6.7 g/dL (ref 6.0–8.3)

## 2013-03-27 LAB — CBC WITH DIFFERENTIAL/PLATELET
Basophils Relative: 0 % (ref 0–1)
Eosinophils Absolute: 0 10*3/uL (ref 0.0–0.7)
Eosinophils Relative: 0 % (ref 0–5)
Eosinophils Relative: 0 % (ref 0–5)
HCT: 26.2 % — ABNORMAL LOW (ref 36.0–46.0)
Hemoglobin: 7.9 g/dL — ABNORMAL LOW (ref 12.0–15.0)
Hemoglobin: 6.4 g/dL — CL (ref 12.0–15.0)
Lymphs Abs: 0.9 10*3/uL (ref 0.7–4.0)
MCH: 22.5 pg — ABNORMAL LOW (ref 26.0–34.0)
MCHC: 30.2 g/dL (ref 30.0–36.0)
MCV: 72.8 fL — ABNORMAL LOW (ref 78.0–100.0)
MCV: 75 fL — ABNORMAL LOW (ref 78.0–100.0)
Monocytes Absolute: 0.8 10*3/uL (ref 0.1–1.0)
Monocytes Relative: 14 % — ABNORMAL HIGH (ref 3–12)
Monocytes Relative: 16 % — ABNORMAL HIGH (ref 3–12)
Neutro Abs: 3.9 10*3/uL (ref 1.7–7.7)
Platelets: 240 10*3/uL (ref 150–400)
RBC: 2.84 MIL/uL — ABNORMAL LOW (ref 3.87–5.11)

## 2013-03-27 LAB — OCCULT BLOOD, POC DEVICE: Fecal Occult Bld: NEGATIVE

## 2013-03-27 LAB — PROTIME-INR: Prothrombin Time: 16.2 seconds — ABNORMAL HIGH (ref 11.6–15.2)

## 2013-03-27 LAB — TROPONIN I: Troponin I: <0.3 ng/mL (ref <0.30)

## 2013-03-27 LAB — LIPASE, BLOOD: Lipase: 54 U/L (ref 11–59)

## 2013-03-27 LAB — PREPARE RBC (CROSSMATCH)

## 2013-03-27 MED ORDER — ONDANSETRON HCL 4 MG/2ML IJ SOLN
4.0000 mg | Freq: Three times a day (TID) | INTRAMUSCULAR | Status: DC | PRN
  Administered 2013-03-27: 4 mg via INTRAVENOUS
  Filled 2013-03-27: qty 2

## 2013-03-27 MED ORDER — OXYCODONE HCL 5 MG PO TABS
5.0000 mg | ORAL_TABLET | ORAL | Status: DC | PRN
  Administered 2013-04-01: 5 mg via ORAL
  Filled 2013-03-27: qty 1

## 2013-03-27 MED ORDER — SODIUM CHLORIDE 0.9 % IJ SOLN
3.0000 mL | Freq: Two times a day (BID) | INTRAMUSCULAR | Status: DC
  Administered 2013-03-27 – 2013-03-31 (×6): 3 mL via INTRAVENOUS

## 2013-03-27 MED ORDER — SODIUM CHLORIDE 0.9 % IV BOLUS (SEPSIS)
1000.0000 mL | Freq: Once | INTRAVENOUS | Status: AC
  Administered 2013-03-27: 1000 mL via INTRAVENOUS

## 2013-03-27 MED ORDER — ACETAMINOPHEN 325 MG PO TABS
650.0000 mg | ORAL_TABLET | Freq: Four times a day (QID) | ORAL | Status: DC | PRN
  Administered 2013-03-27 – 2013-03-31 (×4): 650 mg via ORAL
  Filled 2013-03-27 (×5): qty 2

## 2013-03-27 MED ORDER — FUROSEMIDE 10 MG/ML IJ SOLN
20.0000 mg | Freq: Once | INTRAMUSCULAR | Status: AC
  Administered 2013-03-27: 20 mg via INTRAVENOUS
  Filled 2013-03-27: qty 2

## 2013-03-27 MED ORDER — PANTOPRAZOLE SODIUM 40 MG IV SOLR
40.0000 mg | Freq: Two times a day (BID) | INTRAVENOUS | Status: DC
  Administered 2013-03-27 – 2013-03-31 (×8): 40 mg via INTRAVENOUS
  Filled 2013-03-27 (×9): qty 40

## 2013-03-27 MED ORDER — ONDANSETRON HCL 4 MG PO TABS: 4.0000 mg | ORAL_TABLET | Freq: Four times a day (QID) | ORAL | Status: DC | PRN

## 2013-03-27 MED ORDER — SODIUM CHLORIDE 0.9 % IV SOLN
INTRAVENOUS | Status: DC
  Administered 2013-03-27: 14:00:00 via INTRAVENOUS

## 2013-03-27 MED ORDER — ACETAMINOPHEN 650 MG RE SUPP
650.0000 mg | Freq: Four times a day (QID) | RECTAL | Status: DC | PRN
  Administered 2013-03-30: 650 mg via RECTAL
  Filled 2013-03-27: qty 1

## 2013-03-27 MED ORDER — SODIUM CHLORIDE 0.9 % IV SOLN: INTRAVENOUS | Status: DC

## 2013-03-27 MED ORDER — ONDANSETRON HCL 4 MG/2ML IJ SOLN
4.0000 mg | Freq: Four times a day (QID) | INTRAMUSCULAR | Status: DC | PRN
  Administered 2013-03-30 (×2): 4 mg via INTRAVENOUS
  Filled 2013-03-27 (×2): qty 2

## 2013-03-27 NOTE — ED Notes (Signed)
Report given to Lisa, RN.

## 2013-03-27 NOTE — Progress Notes (Signed)
Quick Note:  Spoke with pt--continues to feel dizzy and nauseated. New anemia. Sending to ER for further eval/treatment. ______

## 2013-03-27 NOTE — ED Notes (Signed)
Patient transported to X-ray 

## 2013-03-27 NOTE — ED Provider Notes (Signed)
CSN: [id number]     Arrival date & time 03/27/13  1145 History   First MD Initiated Contact with Patient 03/27/13 1153     Chief Complaint  Patient presents with  . Abnormal Lab   (Consider location/radiation/quality/duration/timing/severity/associated sxs/prior Treatment) HPI Comments:  Patient presents with low hemoglobin. She had routine blood workup at Dr. is yesterday for generalized weakness and not feeling well. Hemoglobin was 7.9. She denies any blood in her stool or vomiting. She endorses not feeling well for the past 2 months with generalized weakness and dizziness. No chest pain, shortness of breath, abdominal pain, fevers. Decreased by mouth intake and urine output. Reports colonoscopy one year ago was negative.  The history is provided by the patient.    Past Medical History  Diagnosis Date  . Hypertension   . Renal insufficiency   . Osteopenia   . Allergy   . Heart murmur   . GERD (gastroesophageal reflux disease)    Past Surgical History  Procedure Laterality Date  . Appendectomy    . Colonoscopy  2008  . Spine surgery     Family History  Problem Relation Age of Onset  . Heart attack Father 44  . Leukemia Mother   . Diabetes Brother   . Diabetes Sister    History  Substance Use Topics  . Smoking status: Former Smoker -- 0.50 packs/day for 2 years    Types: Cigarettes    Quit date: 07/30/1960  . Smokeless tobacco: Never Used  . Alcohol Use: Yes     Comment: rare-wine drinker   OB History   Grav Para Term Preterm Abortions TAB SAB Ect Mult Living                 Review of Systems  Constitutional: Positive for fatigue. Negative for activity change.  HENT: Negative for congestion and rhinorrhea.   Respiratory: Negative for cough, chest tightness and shortness of breath.   Cardiovascular: Negative for chest pain.  Gastrointestinal: Negative for nausea, vomiting, abdominal pain and blood in stool.  Genitourinary: Negative for dysuria, hematuria, vaginal  bleeding and vaginal discharge.  Musculoskeletal: Negative for back pain.  Skin: Negative for rash.  Neurological: Positive for dizziness, weakness and light-headedness. Negative for headaches.  A complete 10 system review of systems was obtained and all systems are negative except as noted in the HPI and PMH.    Allergies  Penicillins  Home Medications   Current Outpatient Rx  Name  Route  Sig  Dispense  Refill  . amLODipine (NORVASC) 5 MG tablet   Oral   Take 1 tablet (5 mg total) by mouth daily.   30 tablet   12   . aspirin 81 MG tablet   Oral   Take 81 mg by mouth daily.           . bisoprolol-hydrochlorothiazide (ZIAC) 10-6.25 MG per tablet   Oral   Take 1 tablet by mouth daily.   30 tablet   11   . Calcium Carbonate-Vitamin D (CALCIUM + D PO)   Oral   Take 1 tablet by mouth daily.           . Misc Natural Products (OSTEO BI-FLEX ADV TRIPLE ST PO)   Oral   Take 1 tablet by mouth daily.          Marland Kitchen nystatin (MYCOSTATIN) 100000 UNIT/ML suspension   Oral   Take 5 mLs (500,000 Units total) by mouth 4 (four) times daily. Swish and spit  180 mL   0   . omeprazole (PRILOSEC) 40 MG capsule   Oral   Take 1 capsule (40 mg total) by mouth daily.   30 capsule   11   . ondansetron (ZOFRAN) 8 MG tablet   Oral   Take 1 tablet (8 mg total) by mouth every 8 (eight) hours as needed for nausea.   20 tablet   0    BP 110/55  Pulse 89  Temp(Src) 97.7 F (36.5 C) (Oral)  SpO2 95% Physical Exam  Constitutional: She is oriented to person, place, and time. She appears well-developed and well-nourished. No distress.  Appears pale  HENT:  Head: Normocephalic and atraumatic.  Mouth/Throat: Oropharynx is clear and moist. No oropharyngeal exudate.  Eyes: Conjunctivae and EOM are normal. Pupils are equal, round, and reactive to light.  Neck: Normal range of motion. Neck supple.  Cardiovascular: Normal rate, regular rhythm and normal heart sounds.   No murmur  heard. Pulmonary/Chest: Effort normal and breath sounds normal.  Abdominal: Soft. There is no tenderness. There is no rebound and no guarding.  Genitourinary:  No hemorrhoids, brown stool  Musculoskeletal: Normal range of motion. She exhibits no edema and no tenderness.  Neurological: She is alert and oriented to person, place, and time. No cranial nerve deficit. She exhibits normal muscle tone. Coordination normal.  CN 2-12 intact, no ataxia on finger to nose, no nystagmus, 5/5 strength throughout, no pronator drift, Romberg negative, normal gait.   Skin: Skin is warm.    ED Course  Procedures (including critical care time) Labs Review Labs Reviewed  CBC WITH DIFFERENTIAL - Abnormal; Notable for the following:    RBC 2.84 (*)    Hemoglobin 6.4 (*)    HCT 21.3 (*)    MCV 75.0 (*)    MCH 22.5 (*)    RDW 17.3 (*)    Monocytes Relative 16 (*)    Monocytes Absolute 1.1 (*)    All other components within normal limits  COMPREHENSIVE METABOLIC PANEL - Abnormal; Notable for the following:    Sodium 133 (*)    Chloride 92 (*)    Glucose, Bld 233 (*)    Creatinine, Ser 1.60 (*)    Calcium 11.2 (*)    Albumin 2.4 (*)    Alkaline Phosphatase 196 (*)    GFR calc non Af Amer 31 (*)    GFR calc Af Amer 36 (*)    All other components within normal limits  PROTIME-INR - Abnormal; Notable for the following:    Prothrombin Time 16.2 (*)    All other components within normal limits  LIPASE, BLOOD  TROPONIN I  URINALYSIS, ROUTINE W REFLEX MICROSCOPIC  OCCULT BLOOD, POC DEVICE  TYPE AND SCREEN  PREPARE RBC (CROSSMATCH)  ABO/RH   Imaging Review Dg Chest 2 View  03/27/2013   *RADIOLOGY REPORT*  Clinical Data: Shortness of breath  CHEST - 2 VIEW  Comparison: 03/12/13  Findings: The heart and pulmonary vascularity are within normal limits.  The lungs are clear bilaterally.  No acute bony abnormality is seen.  IMPRESSION: No acute abnormality noted.   Original Report Authenticated By: Alcide Clever, M.D.    MDM   1. Anemia   2. Hypercalcemia   3. Acute kidney injury    2 months of generalized weakness and dizziness. Labs yesterday showed anemia of 7.9. Denies any blood in the stool. Hemoglobin in November 13 was 13.  Nonfocal neuro exam. No chest pain or abdominal pain.  Hemoglobin here 6.4. Guiaic negative. Orthostatics negative. Hypercalcemia of 11.2. Acute kidney injury. Creatinine 1.6.  Patient started on IV fluids, and type and cross for 2 units of blood. Still suspect GI source given although fecal occult test was negative. Consider malignancy given hypercalcemia.  Admission discussed with Dr. Vanessa Barbara.   Date: 03/27/2013  Rate: 87  Rhythm: normal sinus rhythm  QRS Axis: normal  Intervals: normal  ST/T Wave abnormalities: normal  Conduction Disutrbances:none  Narrative Interpretation:   Old EKG Reviewed: none available      Glynn Octave, MD 03/27/13 1427

## 2013-03-27 NOTE — H&P (Signed)
[facility] History and Physical  Tonya Wise AVW:[id number] DOB: 03/04/1941 DOA: 03/27/2013  Referring physician: Dr. Laurey Arrow PCP: [name], MD  Specialists: Dr. Loreta Ave  Chief Complaint: Generalized weakness  HPI: Tonya Wise is a 72 y.o. female with a past medical history of hypertension, chronic kidney disease, gastroesophageal reflux disease who was referred to the department today by her primary care provider. Patient reporting a two-week history of generalized weakness, fatigue, poor tolerance to physical exertion, shortness of breath, dizziness and lightheadedness. She saw her primary care provider yesterday and had a CBC drawn. Lab work in the emergency department showed a hemoglobin of 6.4 with a hematocrit of 21.3.  A CBC drawn at her primary care provider's office yesterday showed a hemoglobin of 7.9 with a hematocrit of 26.2. Looking back at her records it appears that she had a hemoglobin and hematocrit of 13.6. And 39.8 respectively on 06/02/2012. She denies like tarry stools, bright red blood per rectum, hematemesis. She reports having regular bowel movements, but still having a brown color. She denies abdominal pain, fevers or chills. She does report having an episode of nausea vomiting yesterday, which was a gastric contents and nonbloody. She reports taking a ten-day course of steroids several weeks ago. She also reports taking a leave on average to 4 times a week for chronic lower back pain. She is a patient of Dr. Haywood Pao stating undergoing colonoscopy and EGD several years ago. Patient was typed and crossed has ordered 4 units total of packed red blood cell transfusion. She denies headaches, visual changes, dysuria, hematuria, abdominal pain, extremity swelling.     Review of Systems: The patient denies anorexia, fever, weight loss,, vision loss, decreased hearing, hoarseness, chest pain, syncope, peripheral edema, balance deficits, hemoptysis, abdominal  pain, melena, hematochezia, severe indigestion/heartburn, hematuria, incontinence, genital sores, muscle weakness, suspicious skin lesions, transient blindness, difficulty walking, depression, unusual weight change, abnormal bleeding, enlarged lymph nodes, angioedema, and breast masses.    Past Medical History  Diagnosis Date  . Hypertension   . Renal insufficiency   . Osteopenia   . Allergy   . Heart murmur   . GERD (gastroesophageal reflux disease)   . Anemia   . Hypercalcemia 03/27/2013   Past Surgical History  Procedure Laterality Date  . Appendectomy    . Colonoscopy  2008  . Spine surgery    . Back surgery      LOWER BACK TWICE   Social History:  reports that she quit smoking about 52 years ago. Her smoking use included Cigarettes. She has a 1 pack-year smoking history. She has never used smokeless tobacco. She reports that  drinks alcohol. She reports that she does not use illicit drugs. Patient residing in the community. She is married, has 2 children. Currently retired. Highly functional.   Allergies  Allergen Reactions  . Penicillins Rash    Family History  Problem Relation Age of Onset  . Heart attack Father 37  . Leukemia Mother   . Diabetes Brother   . Diabetes Sister     Prior to Admission medications   Not on File   Physical Exam: Filed Vitals:   03/27/13 1537  BP: 112/66  Pulse: 87  Temp: 98.7 F (37.1 C)  Resp: 20     General:  Pale appearing however in no acute distress, currently receiving her first unit of blood  Eyes: Pupils are equal round reactive to light extraocular movement is intact  ENT: Neck supple symmetrical  Neck:  No jugular venous distention  Cardiovascular: Regular rate and rhythm normal S1-S2 no murmurs rubs or gallops  Respiratory: Lungs are clear to auscultation bilaterally no wheezing rhonchi overall  Abdomen: Soft nontender nondistended positive bowel sounds, overall benign abdominal exam.  Skin: Significant  pallor noted otherwise no lesions or rash  Musculoskeletal: Present range of motion to all extremities  Psychiatric: Patient is awake alert oriented x3  Neurologic: She has a nonfocal neurologic examination, global 5 of 5 muscle strength, no alteration to sensation  Labs on Admission:  Basic Metabolic Panel:  Recent Labs Lab 03/26/13 1338 03/27/13 1206  NA 135 133*  K 4.5 4.2  CL 95* 92*  CO2 28 26  GLUCOSE 139* 233*  BUN 18 17  CREATININE 1.37* 1.60*  CALCIUM 9.9 11.2*   Liver Function Tests:  Recent Labs Lab 03/26/13 1338 03/27/13 1206  AST 24 27  ALT 12 11  ALKPHOS 210* 196*  BILITOT 1.0 0.9  PROT 5.5* 6.7  ALBUMIN 2.8* 2.4*    Recent Labs Lab 03/27/13 1206  LIPASE 54   No results found for this basename: AMMONIA,  in the last 168 hours CBC:  Recent Labs Lab 03/26/13 1338 03/27/13 1206  WBC 5.5 6.9  NEUTROABS 3.9 4.9  HGB 7.9* 6.4*  HCT 26.2* 21.3*  MCV 72.8* 75.0*  PLT 237 240   Cardiac Enzymes:  Recent Labs Lab 03/27/13 1206  TROPONINI <0.30    BNP (last 3 results) No results found for this basename: PROBNP,  in the last 8760 hours CBG: No results found for this basename: GLUCAP,  in the last 168 hours  Radiological Exams on Admission: Dg Chest 2 View  03/27/2013   *RADIOLOGY REPORT*  Clinical Data: Shortness of breath  CHEST - 2 VIEW  Comparison: 03/12/13  Findings: The heart and pulmonary vascularity are within normal limits.  The lungs are clear bilaterally.  No acute bony abnormality is seen.  IMPRESSION: No acute abnormality noted.   Original Report Authenticated By: Alcide Clever, M.D.    EKG:   Assessment/Plan Active Problems:   Hypercalcemia   Anemia due to chronic blood loss   Acute renal failure   GI bleed  Probable acute blood loss anemia Patient reporting clinical signs and symptoms consistent with anemia over the last 2 weeks. Lab work at her primary care physician's office yesterday showed a hemoglobin of 7.9  trending down to 6.4 on today's lab work in the emergency room. She denies abdominal pain, bloody stools, melena or hematemesis. I suspect this could be an upper GI bleed having taken oral steroids recently and been on Aleve chronically. I have consulted Dr. Loreta Ave of gastroenterology who will see patient tomorrow and likely perform endoscopy. Will repeat an H&H this evening, transfuse a total of 4 units of packed red blood cells, provide supportive care.  Suspected upper GI bleed Patient reporting taking Aleve chronically on average 3-4 times a week. Also reporting taking ten-day course of oral steroids recently. Will start Protonix 40 mg IV twice a day. A consultation for gastroenterology evaluation has been placed. Await further recommendations from Dr. Loreta Ave. Transfuse blood hold off on aspirin therapy.   Acute on chronic renal failure Acute on chronic renal failure likely secondary to prerenal azotemia in setting of volume depletion from GI bleed. Patient to be transfused with packed red blood cells overnight, followup on a.m. BMP. Of note she is also on and say therapy for chronic back pain.  Gastroesophageal reflux disease On IV PPI  therapy  Hypercalcemia Patient's calcium at 11.2 on today's lab work. It appears it was 9.9 lab work drawn yesterday. Will provide IV fluid resuscitation, followup on repeat AM  BMP  Hyponatremia Patient having sodium level of 133, likely secondary to hypovolemia. Receiving fluid resuscitation  Fluids electrolytes nutrition Placing patient on full liquids, patient receiving blood transfusion  DVT prophylaxis. Bilateral lower extremity SCDs  Code Status: Full code Family Communication: I had direct medication with patient regarding plan of care Disposition Plan: Patient to receive blood transfusion, GI evaluation tomorrow. I anticipate she will require at least 2 night hospitalization  Time spent: 70 minutes  Jeralyn Bennett [facility] Pager  [phone]  If 7PM-7AM, please contact night-coverage [email or web address] Password [facility] 03/27/2013, 4:46 PM

## 2013-03-27 NOTE — ED Notes (Signed)
Pt sent here for abnormal lab; pt unsure of what labs; pt sts dizziness, weakness and nausea

## 2013-03-27 NOTE — Progress Notes (Signed)
Utilization review completed. Shriyans Kuenzi, RN, BSN. 

## 2013-03-27 NOTE — Progress Notes (Signed)
Pt spiked temp 101.2 after 1u PRBC completed. Pt also feels cold and shaky. Paged MD.

## 2013-03-27 NOTE — ED Notes (Signed)
Family at bedside. 

## 2013-03-27 NOTE — ED Notes (Signed)
Critical Hemoglobin reported to Dr. Manus Gunning.

## 2013-03-28 ENCOUNTER — Inpatient Hospital Stay (HOSPITAL_COMMUNITY): Payer: Medicare Other

## 2013-03-28 DIAGNOSIS — D649 Anemia, unspecified: Secondary | ICD-10-CM

## 2013-03-28 DIAGNOSIS — K922 Gastrointestinal hemorrhage, unspecified: Secondary | ICD-10-CM

## 2013-03-28 DIAGNOSIS — N179 Acute kidney failure, unspecified: Secondary | ICD-10-CM

## 2013-03-28 LAB — CBC
HCT: 31.4 % — ABNORMAL LOW (ref 36.0–46.0)
Hemoglobin: 10.3 g/dL — ABNORMAL LOW (ref 12.0–15.0)
MCH: 25 pg — ABNORMAL LOW (ref 26.0–34.0)
MCHC: 32.8 g/dL (ref 30.0–36.0)
RDW: 16.4 % — ABNORMAL HIGH (ref 11.5–15.5)

## 2013-03-28 LAB — BASIC METABOLIC PANEL
BUN: 14 mg/dL (ref 6–23)
Calcium: 10.1 mg/dL (ref 8.4–10.5)
GFR calc non Af Amer: 36 mL/min — ABNORMAL LOW (ref >90)
Glucose, Bld: 125 mg/dL — ABNORMAL HIGH (ref 70–99)

## 2013-03-28 MED ORDER — METRONIDAZOLE IN NACL 5-0.79 MG/ML-% IV SOLN
500.0000 mg | Freq: Three times a day (TID) | INTRAVENOUS | Status: DC
  Administered 2013-03-29 (×2): 500 mg via INTRAVENOUS
  Filled 2013-03-28 (×4): qty 100

## 2013-03-28 MED ORDER — DEXTROSE 5 % IV SOLN
1.0000 g | INTRAVENOUS | Status: DC
  Administered 2013-03-29 (×2): 1 g via INTRAVENOUS
  Filled 2013-03-28 (×2): qty 10

## 2013-03-28 NOTE — Progress Notes (Addendum)
INITIAL NUTRITION ASSESSMENT  DOCUMENTATION CODES Per approved criteria  -Non-severe (moderate) malnutrition in the context of acute illness or injury   INTERVENTION: RD to follow along for po adequacy and need for oral nutrition supplements once diet is advanced.  NUTRITION DIAGNOSIS: Inadequate oral intake related to inability to eat as evidenced by NPO status.   Goal: Intake to meet >90% of estimated nutrition needs.  Monitor:  weight trends, lab trends, I/O's, diet advancement  Reason for Assessment: Malnutrition Screening Tool  72 y.o. female  Admitting Dx: generalized weakness  ASSESSMENT: PMHx significant for HTN, CKD, GERD. Admitted with weakness, fatigue, SOB x 2 weeks. Work-up reveals acute blood loss anemia, suspected upper GIB.  Pt underwent PRBC transfusions. Currently NPO. Pt reports that she is to have a colonoscopy and endoscopy today.   Pt with slight weight loss recently of 4% x <1 month. Pt confirms poor oral intake in the past 2 weeks. She states that foods were tasting metallic to her and she was beginning to develop thrush. Pt meets criteria for moderate MALNUTRITION in the context of acute illness as evidenced by wt loss of 4% x <1 month and intake of <75% x at least 7 days.   Height: Ht Readings from Last 1 Encounters:  03/27/13 5\' 2"  (1.575 m)    Weight: Wt Readings from Last 1 Encounters:  03/28/13 160 lb 0.9 oz (72.6 kg)    Ideal Body Weight: 110 lb  % Ideal Body Weight: 145%  Wt Readings from Last 10 Encounters:  03/28/13 160 lb 0.9 oz (72.6 kg)  03/26/13 157 lb (71.215 kg)  03/12/13 166 lb 3.2 oz (75.388 kg)  02/23/13 166 lb (75.297 kg)  12/31/12 171 lb (77.565 kg)  11/25/12 175 lb (79.379 kg)  06/02/12 177 lb (80.287 kg)  09/03/11 169 lb (76.658 kg)  08/27/11 172 lb (78.019 kg)  08/07/10 161 lb (73.029 kg)    Usual Body Weight: 165 - 170 lb  % Usual Body Weight: 96%  BMI:  Body mass index is 29.27 kg/(m^2).  Overweight.  Estimated Nutritional Needs: Kcal: 1400 - 1550  Protein: 64 - 75 g Fluid: 1.4 - 1.6 liters  Skin: intact  Diet Order: NPO  EDUCATION NEEDS: -No education needs identified at this time   Intake/Output Summary (Last 24 hours) at 03/28/13 0913 Last data filed at 03/28/13 0514  Gross per 24 hour  Intake  577.5 ml  Output   1050 ml  Net -472.5 ml    Last BM: 8/29  Labs:   Recent Labs Lab 03/26/13 1338 03/27/13 1206 03/28/13 0400  NA 135 133* 137  K 4.5 4.2 3.7  CL 95* 92* 97  CO2 28 26 26   BUN 18 17 14   CREATININE 1.37* 1.60* 1.42*  CALCIUM 9.9 11.2* 10.1  GLUCOSE 139* 233* 125*    CBG (last 3)  No results found for this basename: GLUCAP,  in the last 72 hours  Scheduled Meds: . pantoprazole (PROTONIX) IV  40 mg Intravenous Q12H  . sodium chloride  3 mL Intravenous Q12H    Continuous Infusions:   Past Medical History  Diagnosis Date  . Hypertension   . Renal insufficiency   . Osteopenia   . Allergy   . Heart murmur   . GERD (gastroesophageal reflux disease)   . Anemia   . Hypercalcemia 03/27/2013    Past Surgical History  Procedure Laterality Date  . Appendectomy    . Colonoscopy  2008  . Spine surgery    .  Back surgery      LOWER BACK TWICE    Jarold Motto MS, RD, LDN Pager: [phone] After-hours pager: [phone]

## 2013-03-28 NOTE — Progress Notes (Addendum)
ANTIBIOTIC CONSULT NOTE - INITIAL  Pharmacy Consult for zosyn --> Rocephin Indication: empiric --- r/o abdominal infection  Allergies  Allergen Reactions  . Penicillins Rash    Patient Measurements: Height: 5\' 2"  (157.5 cm) Weight: 160 lb 0.9 oz (72.6 kg) IBW/kg (Calculated) : 50.1 Adjusted Body Weight:   Vital Signs: Temp: 102.2 F (39 C) (08/30 2057) Temp src: Oral (08/30 2057) BP: 128/71 mmHg (08/30 2057) Pulse Rate: 108 (08/30 2057) Intake/Output from previous day: 08/29 0701 - 08/30 0700 In: 577.5 [I.V.:250; Blood:327.5] Out: 1050 [Urine:1050] Intake/Output from this shift:    Labs:  Recent Labs  03/26/13 1338 03/27/13 1206 03/28/13 0400  WBC 5.5 6.9 4.8  HGB 7.9* 6.4* 10.3*  PLT 237 240 163  CREATININE 1.37* 1.60* 1.42*   Estimated Creatinine Clearance: 33.9 ml/min (by C-G formula based on Cr of 1.42). No results found for this basename: VANCOTROUGH, VANCOPEAK, VANCORANDOM, GENTTROUGH, GENTPEAK, GENTRANDOM, TOBRATROUGH, TOBRAPEAK, TOBRARND, AMIKACINPEAK, AMIKACINTROU, AMIKACIN,  in the last 72 hours   Microbiology: No results found for this or any previous visit (from the past 720 hour(s)).  Medical History: Past Medical History  Diagnosis Date  . Hypertension   . Renal insufficiency   . Osteopenia   . Allergy   . Heart murmur   . GERD (gastroesophageal reflux disease)   . Anemia   . Hypercalcemia 03/27/2013    Medications:  Prescriptions prior to admission  Medication Sig Dispense Refill  . [DISCONTINUED] amLODipine (NORVASC) 5 MG tablet Take 1 tablet (5 mg total) by mouth daily.  30 tablet  12  . [DISCONTINUED] aspirin 81 MG tablet Take 81 mg by mouth daily.        . [DISCONTINUED] bisoprolol-hydrochlorothiazide (ZIAC) 10-6.25 MG per tablet Take 1 tablet by mouth daily.  30 tablet  11  . [DISCONTINUED] Calcium Carbonate-Vitamin D (CALCIUM + D PO) Take 1 tablet by mouth daily.        . [DISCONTINUED] Misc Natural Products (OSTEO BI-FLEX ADV  TRIPLE ST PO) Take 1 tablet by mouth daily.       . [DISCONTINUED] nystatin (MYCOSTATIN) 100000 UNIT/ML suspension Take 5 mLs (500,000 Units total) by mouth 4 (four) times daily. Swish and spit  180 mL  0  . [DISCONTINUED] omeprazole (PRILOSEC) 40 MG capsule Take 1 capsule (40 mg total) by mouth daily.  30 capsule  11  . [DISCONTINUED] ondansetron (ZOFRAN) 8 MG tablet Take 1 tablet (8 mg total) by mouth every 8 (eight) hours as needed for nausea.  20 tablet  0   Assessment: 72 yo F  Admitted 8/29 w generalized weakness, found to be severely anemic with a hgb of 6.4, suspected UGIB 2/2 chronic NSAIDs, steroids.  Now febrile with Tm 101.4, to start empiric abx for possible abdominal infection.  Goal of Therapy:  Eradication of infection  Plan:  - Made Elray Mcgregor aware of PCN allergy - rec'd orders to d/c zosyn, provider to address alternate abx  - Follow up SCr, UOP, cultures, clinical course and adjust as clinically indicated.  Alison L. Illene Bolus, PharmD, BCPS Clinical Pharmacist Pager: [phone] Pharmacy: [phone] 03/28/2013 10:10 PM     Addendum: Order to change ABX to Rocephin.  Will start Rocephin 1g IV Q24H and f/u in am.  Vernard Gambles, PharmD, BCPS 03/28/2013 10:51 PM

## 2013-03-28 NOTE — Progress Notes (Signed)
3 unit blood transfusion completed- pt tolerated well- no temps, chills, SOB. CBC to be taken at 5am. Will continue to monitor

## 2013-03-28 NOTE — Consult Note (Signed)
Reason for Consult: Anemia.  Referring Physician: THP  SUNDUS PETE is an 72 y.o. female.  HPI: 72 year old white female, with a history of back pain for several years, taking Aleve 5 pills per week for several years, gives a history of anorexia and epigastric pain for the last 3 weeks. She claims she has lost 10 lbs in the last 3 weeks. She has had an EGD and a colonoscopy in 09/2011 when she was found to have reflux esophagitis and an irregular Z-line. The colonoscopy was essentially unrevealing. She has 1-2 BM's per day without history of melena or hematochezia.        Past Medical History  Diagnosis Date  . Hypertension   . Renal insufficiency   . Osteopenia   . Allergy   . Heart murmur   . GERD (gastroesophageal reflux disease)   . Anemia   . Hypercalcemia 03/27/2013   Past Surgical History  Procedure Laterality Date  . Appendectomy    . Colonoscopy  2008  . Spine surgery    . Back surgery      LOWER BACK TWICE   Family History  Problem Relation Age of Onset  . Heart attack Father 3  . Leukemia Mother   . Diabetes Brother   . Diabetes Sister    Social History:  reports that she quit smoking about 52 years ago. Her smoking use included Cigarettes. She has a 1 pack-year smoking history. She has never used smokeless tobacco. She reports that  drinks alcohol. She reports that she does not use illicit drugs.  Allergies:  Allergies  Allergen Reactions  . Penicillins Rash   Medications: I have reviewed the patient's current medications.  Results for orders placed during the hospital encounter of 03/27/13 (from the past 48 hour(s))  CBC WITH DIFFERENTIAL     Status: Abnormal   Collection Time    03/27/13 12:06 PM      Result Value Range   WBC 6.9  4.0 - 10.5 K/uL   RBC 2.84 (*) 3.87 - 5.11 MIL/uL   Hemoglobin 6.4 (*) 12.0 - 15.0 g/dL   Comment: REPEATED TO VERIFY     CRITICAL RESULT CALLED TO, READ BACK BY AND VERIFIED WITH:     TREVINO,B RN @ 1239 ON 03/27/13 BY  LEONARD,A   HCT 21.3 (*) 36.0 - 46.0 %   MCV 75.0 (*) 78.0 - 100.0 fL   MCH 22.5 (*) 26.0 - 34.0 pg   MCHC 30.0  30.0 - 36.0 g/dL   RDW 16.1 (*) 09.6 - 04.5 %   Platelets 240  150 - 400 K/uL   Neutrophils Relative % 70  43 - 77 %   Neutro Abs 4.9  1.7 - 7.7 K/uL   Lymphocytes Relative 13  12 - 46 %   Lymphs Abs 0.9  0.7 - 4.0 K/uL   Monocytes Relative 16 (*) 3 - 12 %   Monocytes Absolute 1.1 (*) 0.1 - 1.0 K/uL   Eosinophils Relative 0  0 - 5 %   Eosinophils Absolute 0.0  0.0 - 0.7 K/uL   Basophils Relative 0  0 - 1 %   Basophils Absolute 0.0  0.0 - 0.1 K/uL  COMPREHENSIVE METABOLIC PANEL     Status: Abnormal   Collection Time    03/27/13 12:06 PM      Result Value Range   Sodium 133 (*) 135 - 145 mEq/L   Potassium 4.2  3.5 - 5.1 mEq/L   Chloride  92 (*) 96 - 112 mEq/L   CO2 26  19 - 32 mEq/L   Glucose, Bld 233 (*) 70 - 99 mg/dL   BUN 17  6 - 23 mg/dL   Creatinine, Ser 8.11 (*) 0.50 - 1.10 mg/dL   Calcium 91.4 (*) 8.4 - 10.5 mg/dL   Total Protein 6.7  6.0 - 8.3 g/dL   Albumin 2.4 (*) 3.5 - 5.2 g/dL   AST 27  0 - 37 U/L   ALT 11  0 - 35 U/L   Alkaline Phosphatase 196 (*) 39 - 117 U/L   Total Bilirubin 0.9  0.3 - 1.2 mg/dL   GFR calc non Af Amer 31 (*) >90 mL/min   GFR calc Af Amer 36 (*) >90 mL/min   Comment: (NOTE)     The eGFR has been calculated using the CKD EPI equation.     This calculation has not been validated in all clinical situations.     eGFR's persistently <90 mL/min signify possible Chronic Kidney     Disease.  PROTIME-INR     Status: Abnormal   Collection Time    03/27/13 12:06 PM      Result Value Range   Prothrombin Time 16.2 (*) 11.6 - 15.2 seconds   INR 1.33  0.00 - 1.49  LIPASE, BLOOD     Status: None   Collection Time    03/27/13 12:06 PM      Result Value Range   Lipase 54  11 - 59 U/L  TROPONIN I     Status: None   Collection Time    03/27/13 12:06 PM      Result Value Range   Troponin I <0.30  <0.30 ng/mL   Comment:            Due to  the release kinetics of cTnI,     a negative result within the first hours     of the onset of symptoms does not rule out     myocardial infarction with certainty.     If myocardial infarction is still suspected,     repeat the test at appropriate intervals.  TYPE AND SCREEN     Status: None   Collection Time    03/27/13 12:15 PM      Result Value Range   ABO/RH(D) B POS     Antibody Screen NEG     Sample Expiration 03/30/2013     Unit Number [id number]     Blood Component Type RED CELLS,LR     Unit division 00     Status of Unit ISSUED,FINAL     Transfusion Status OK TO TRANSFUSE     Crossmatch Result Compatible     Unit Number [id number]     Blood Component Type RED CELLS,LR     Unit division 00     Status of Unit ISSUED,FINAL     Transfusion Status OK TO TRANSFUSE     Crossmatch Result Compatible     Unit Number [id number]     Blood Component Type RBC LR PHER1     Unit division 00     Status of Unit ISSUED,FINAL     Transfusion Status OK TO TRANSFUSE     Crossmatch Result Compatible     Unit Number [id number]     Blood Component Type RED CELLS,LR     Unit division 00     Status of Unit ALLOCATED     Transfusion Status OK TO TRANSFUSE  Crossmatch Result Compatible    PREPARE RBC (CROSSMATCH)     Status: None   Collection Time    03/27/13 12:15 PM      Result Value Range   Order Confirmation ORDER PROCESSED BY BLOOD BANK    ABO/RH     Status: None   Collection Time    03/27/13 12:15 PM      Result Value Range   ABO/RH(D) B POS    OCCULT BLOOD, POC DEVICE     Status: None   Collection Time    03/27/13 12:20 PM      Result Value Range   Fecal Occult Bld NEGATIVE  NEGATIVE  URINALYSIS, ROUTINE W REFLEX MICROSCOPIC     Status: None   Collection Time    03/27/13  2:04 PM      Result Value Range   Color, Urine YELLOW  YELLOW   APPearance CLEAR  CLEAR   Specific Gravity, Urine 1.005  1.005 - 1.030   pH 6.0  5.0 - 8.0   Glucose, UA NEGATIVE   NEGATIVE mg/dL   Hgb urine dipstick NEGATIVE  NEGATIVE   Bilirubin Urine NEGATIVE  NEGATIVE   Ketones, ur NEGATIVE  NEGATIVE mg/dL   Protein, ur NEGATIVE  NEGATIVE mg/dL   Urobilinogen, UA 1.0  0.0 - 1.0 mg/dL   Nitrite NEGATIVE  NEGATIVE   Leukocytes, UA NEGATIVE  NEGATIVE   Comment: MICROSCOPIC NOT DONE ON URINES WITH NEGATIVE PROTEIN, BLOOD, LEUKOCYTES, NITRITE, OR GLUCOSE <1000 mg/dL.  PREPARE RBC (CROSSMATCH)     Status: None   Collection Time    03/27/13  4:55 PM      Result Value Range   Order Confirmation ORDER PROCESSED BY BLOOD BANK    URINALYSIS, ROUTINE W REFLEX MICROSCOPIC     Status: None   Collection Time    03/27/13  5:59 PM      Result Value Range   Color, Urine YELLOW  YELLOW   APPearance CLEAR  CLEAR   Specific Gravity, Urine 1.012  1.005 - 1.030   pH 6.0  5.0 - 8.0   Glucose, UA NEGATIVE  NEGATIVE mg/dL   Hgb urine dipstick NEGATIVE  NEGATIVE   Bilirubin Urine NEGATIVE  NEGATIVE   Ketones, ur NEGATIVE  NEGATIVE mg/dL   Protein, ur NEGATIVE  NEGATIVE mg/dL   Urobilinogen, UA 1.0  0.0 - 1.0 mg/dL   Nitrite NEGATIVE  NEGATIVE   Leukocytes, UA NEGATIVE  NEGATIVE   Comment: MICROSCOPIC NOT DONE ON URINES WITH NEGATIVE PROTEIN, BLOOD, LEUKOCYTES, NITRITE, OR GLUCOSE <1000 mg/dL.  CBC     Status: Abnormal   Collection Time    03/28/13  4:00 AM      Result Value Range   WBC 4.8  4.0 - 10.5 K/uL   RBC 4.12  3.87 - 5.11 MIL/uL   Hemoglobin 10.3 (*) 12.0 - 15.0 g/dL   Comment: DELTA CHECK NOTED     POST TRANSFUSION SPECIMEN   HCT 31.4 (*) 36.0 - 46.0 %   MCV 76.2 (*) 78.0 - 100.0 fL   MCH 25.0 (*) 26.0 - 34.0 pg   MCHC 32.8  30.0 - 36.0 g/dL   RDW 96.0 (*) 45.4 - 09.8 %   Platelets 163  150 - 400 K/uL   Comment: DELTA CHECK NOTED     REPEATED TO VERIFY     SPECIMEN CHECKED FOR CLOTS  BASIC METABOLIC PANEL     Status: Abnormal   Collection Time    03/28/13  4:00 AM      Result Value Range   Sodium 137  135 - 145 mEq/L   Potassium 3.7  3.5 - 5.1 mEq/L    Chloride 97  96 - 112 mEq/L   CO2 26  19 - 32 mEq/L   Glucose, Bld 125 (*) 70 - 99 mg/dL   BUN 14  6 - 23 mg/dL   Creatinine, Ser 4.09 (*) 0.50 - 1.10 mg/dL   Calcium 81.1  8.4 - 91.4 mg/dL   GFR calc non Af Amer 36 (*) >90 mL/min   GFR calc Af Amer 42 (*) >90 mL/min   Comment: (NOTE)     The eGFR has been calculated using the CKD EPI equation.     This calculation has not been validated in all clinical situations.     eGFR's persistently <90 mL/min signify possible Chronic Kidney     Disease.  HEMOGLOBIN A1C     Status: Abnormal   Collection Time    03/28/13  4:00 AM      Result Value Range   Hemoglobin A1C 6.3 (*) <5.7 %   Comment: (NOTE)                                                                               According to the ADA Clinical Practice Recommendations for 2011, when     HbA1c is used as a screening test:      >=6.5%   Diagnostic of Diabetes Mellitus               (if abnormal result is confirmed)     5.7-6.4%   Increased risk of developing Diabetes Mellitus     References:Diagnosis and Classification of Diabetes Mellitus,Diabetes     Care,2011,34(Suppl 1):S62-S69 and Standards of Medical Care in             Diabetes - 2011,Diabetes Care,2011,34 (Suppl 1):S11-S61.   Mean Plasma Glucose 134 (*) <117 mg/dL   Comment: Performed at [facility]   Dg Chest 2 View  03/27/2013   *RADIOLOGY REPORT*  Clinical Data: Shortness of breath  CHEST - 2 VIEW  Comparison: 03/12/13  Findings: The heart and pulmonary vascularity are within normal limits.  The lungs are clear bilaterally.  No acute bony abnormality is seen.  IMPRESSION: No acute abnormality noted.   Original Report Authenticated By: Alcide Clever, M.D.   Review of Systems  Constitutional: Positive for malaise/fatigue.  Respiratory: Positive for cough.   Cardiovascular: Negative.   Gastrointestinal: Negative for nausea and vomiting.  Musculoskeletal: Positive for back pain and joint pain.  Skin: Negative.    Neurological: Positive for weakness.  Psychiatric/Behavioral: Negative.    Blood pressure 124/70, pulse 107, temperature 99.6 F (37.6 C), temperature source Oral, resp. rate 18, height 5\' 2"  (1.575 m), weight 72.6 kg (160 lb 0.9 oz), SpO2 91.00%. Physical Exam  Vitals reviewed. Constitutional: She is oriented to person, place, and time. She appears well-developed and well-nourished.  HENT:  Head: Normocephalic and atraumatic.  Eyes: Conjunctivae and EOM are normal. Pupils are equal, round, and reactive to light.  Neck: Normal range of motion. Neck supple.  Cardiovascular: Normal rate and regular rhythm.  Respiratory: Effort normal and breath sounds normal.  GI: Soft. Bowel sounds are normal. She exhibits no distension and no mass. There is tenderness. There is no rebound and no guarding.  Musculoskeletal: Normal range of motion.  Neurological: She is alert and oriented to person, place, and time.  Skin: Skin is warm and dry.  Psychiatric: She has a normal mood and affect. Her behavior is normal. Judgment and thought content normal.   Assessment/Plan: 1) Epigastric pain with anemia-will proceed with an EGD tomorrow-rule out PUD. Continue PPI's for now. Hold off of all NSAIDS for now.  2) Anemia: etiology unclear.  3) Chronic back pain/arthritis/osteopenia: on Aleve for several years.  4) Renal insufficiency.  Giovonni Poirier 03/28/2013, 6:02 PM

## 2013-03-28 NOTE — Progress Notes (Signed)
PATIENT DETAILS Name: Tonya Wise Age: 72 y.o. Sex: female Date of Birth: August 06, 1940 Admit Date: 03/27/2013 Admitting Physician Jeralyn Bennett, MD ZOX:[id number] Leonette Most, MD  Subjective: No major issues overnight  Assessment/Plan: Active Problems: Anemia  -suspected-to be from recent blood loss-likely a GI bleed-interestingly FOBT neg on admission-but has Microcytic anemia that is new. -admitted, and given 3 units of PRBC -Hb now 10.3 -needs GI work up-Dr Loreta Ave to evaluate later today. If GI work up neg-then we can consider further work up for anemia.  Suspected upper GI bleed -no active bleeding-FOBT neg on admit-However has Microcytic anemia which is new -was on NSAID's steroids recently -c/w PPI -await GI input for EGD/Colonoscopy  Acute on chronic renal failure -likely secondary to prerenal azotemia in setting of volume depletion from GI bleed -better with IVF  Gastroesophageal reflux disease  -On IV PPI therapy-change to oral PPI on discharge  Mild Hypercalcemia -better with IVF -monitor -given mild renal failure, anemia-send SPEP/UPEP  HTN -currently controlled without the use of any antihypertensive medications -resume when able  Disposition: Remain inpatient  DVT Prophylaxis:  SCD's  Code Status: Full code   Family Communication None at bedside  Procedures:  None  CONSULTS:  GI   MEDICATIONS: Scheduled Meds: . pantoprazole (PROTONIX) IV  40 mg Intravenous Q12H  . sodium chloride  3 mL Intravenous Q12H   Continuous Infusions:  PRN Meds:.acetaminophen, acetaminophen, ondansetron (ZOFRAN) IV, ondansetron, oxyCODONE  Antibiotics: Anti-infectives   None       PHYSICAL EXAM: Vital signs in last 24 hours: Filed Vitals:   03/28/13 0115 03/28/13 0144 03/28/13 0420 03/28/13 0511  BP: 115/68 117/68  119/70  Pulse: 89 92  99  Temp: 98.7 F (37.1 C) 98.8 F (37.1 C)  99.9 F (37.7 C)  TempSrc: Oral Oral  Oral  Resp: 18 20  16    Height:      Weight:   71.1 kg (156 lb 12 oz) 72.6 kg (160 lb 0.9 oz)  SpO2:    94%    Weight change:  Filed Weights   03/27/13 1855 03/28/13 0420 03/28/13 0511  Weight: 71.215 kg (157 lb) 71.1 kg (156 lb 12 oz) 72.6 kg (160 lb 0.9 oz)   Body mass index is 29.27 kg/(m^2).   Gen Exam: Awake and alert with clear speech.   Neck: Supple, No JVD.   Chest: B/L Clear.   CVS: S1 S2 Regular, no murmurs.  Abdomen: soft, BS +, non tender, non distended.  Extremities: no edema, lower extremities warm to touch Neurologic: Non Focal.   Skin: No Rash.   Wounds: N/A.    Intake/Output from previous day:  Intake/Output Summary (Last 24 hours) at 03/28/13 1032 Last data filed at 03/28/13 0514  Gross per 24 hour  Intake  577.5 ml  Output   1050 ml  Net -472.5 ml     LAB RESULTS: CBC  Recent Labs Lab 03/26/13 1338 03/27/13 1206 03/28/13 0400  WBC 5.5 6.9 4.8  HGB 7.9* 6.4* 10.3*  HCT 26.2* 21.3* 31.4*  PLT 237 240 163  MCV 72.8* 75.0* 76.2*  MCH 21.9* 22.5* 25.0*  MCHC 30.2 30.0 32.8  RDW 17.6* 17.3* 16.4*  LYMPHSABS 0.8 0.9  --   MONOABS 0.8 1.1*  --   EOSABS 0.0 0.0  --   BASOSABS 0.0 0.0  --     Chemistries   Recent Labs Lab 03/26/13 1338 03/27/13 1206 03/28/13 0400  NA 135 133* 137  K 4.5 4.2 3.7  CL 95* 92* 97  CO2 28 26 26   GLUCOSE 139* 233* 125*  BUN 18 17 14   CREATININE 1.37* 1.60* 1.42*  CALCIUM 9.9 11.2* 10.1    CBG: No results found for this basename: GLUCAP,  in the last 168 hours  GFR Estimated Creatinine Clearance: 33.9 ml/min (by C-G formula based on Cr of 1.42).  Coagulation profile  Recent Labs Lab 03/27/13 1206  INR 1.33    Cardiac Enzymes  Recent Labs Lab 03/27/13 1206  TROPONINI <0.30    No components found with this basename: POCBNP,  No results found for this basename: DDIMER,  in the last 72 hours No results found for this basename: HGBA1C,  in the last 72 hours No results found for this basename: CHOL, HDL, LDLCALC,  TRIG, CHOLHDL, LDLDIRECT,  in the last 72 hours No results found for this basename: TSH, T4TOTAL, FREET3, T3FREE, THYROIDAB,  in the last 72 hours No results found for this basename: VITAMINB12, FOLATE, FERRITIN, TIBC, IRON, RETICCTPCT,  in the last 72 hours  Recent Labs  03/27/13 1206  LIPASE 54    Urine Studies No results found for this basename: UACOL, UAPR, USPG, UPH, UTP, UGL, UKET, UBIL, UHGB, UNIT, UROB, ULEU, UEPI, UWBC, URBC, UBAC, CAST, CRYS, UCOM, BILUA,  in the last 72 hours  MICROBIOLOGY: No results found for this or any previous visit (from the past 240 hour(s)).  RADIOLOGY STUDIES/RESULTS: Dg Chest 2 View  03/27/2013   *RADIOLOGY REPORT*  Clinical Data: Shortness of breath  CHEST - 2 VIEW  Comparison: 03/12/13  Findings: The heart and pulmonary vascularity are within normal limits.  The lungs are clear bilaterally.  No acute bony abnormality is seen.  IMPRESSION: No acute abnormality noted.   Original Report Authenticated By: Alcide Clever, M.D.   Dg Chest 2 View  03/12/2013   *RADIOLOGY REPORT*  Clinical Data: Cough  CHEST - 2 VIEW  Comparison: 03/09/2011  Findings: Cardiomediastinal silhouette is stable.  No acute infiltrate or pleural effusion. Central mild bronchitic changes. No pulmonary edema.  Partially visualized postsurgical changes lower lumbar spine.  IMPRESSION: Central mild bronchitic changes. No acute infiltrate or pulmonary edema.   Original Report Authenticated By: Natasha Mead, M.D.    Jeoffrey Massed, MD  [facility] Pager:[phone]  If 7PM-7AM, please contact night-coverage [email or web address] Password [email or web address] 03/28/2013, 10:32 AM   LOS: 1 day

## 2013-03-29 ENCOUNTER — Encounter (HOSPITAL_COMMUNITY)
Admission: EM | Disposition: A | Discharge status: Home Health | Payer: Self-pay | Source: Home / Self Care | Attending: Internal Medicine

## 2013-03-29 ENCOUNTER — Encounter (HOSPITAL_COMMUNITY): Payer: Self-pay | Admitting: *Deleted

## 2013-03-29 DIAGNOSIS — I10 Essential (primary) hypertension: Secondary | ICD-10-CM

## 2013-03-29 HISTORY — PX: ESOPHAGOGASTRODUODENOSCOPY: SHX5428

## 2013-03-29 LAB — CBC
HCT: 32.5 % — ABNORMAL LOW (ref 36.0–46.0)
MCV: 77.2 fL — ABNORMAL LOW (ref 78.0–100.0)
Platelets: 164 10*3/uL (ref 150–400)
RBC: 4.21 MIL/uL (ref 3.87–5.11)
WBC: 4.6 10*3/uL (ref 4.0–10.5)

## 2013-03-29 LAB — COMPREHENSIVE METABOLIC PANEL
AST: 21 U/L (ref 0–37)
CO2: 26 mEq/L (ref 19–32)
Chloride: 97 mEq/L (ref 96–112)
Creatinine, Ser: 1.21 mg/dL — ABNORMAL HIGH (ref 0.50–1.10)
GFR calc non Af Amer: 44 mL/min — ABNORMAL LOW (ref >90)
Total Bilirubin: 0.9 mg/dL (ref 0.3–1.2)

## 2013-03-29 SURGERY — EGD (ESOPHAGOGASTRODUODENOSCOPY)
Anesthesia: Moderate Sedation

## 2013-03-29 MED ORDER — MIDAZOLAM HCL 10 MG/2ML IJ SOLN
INTRAMUSCULAR | Status: DC | PRN
  Administered 2013-03-29: 11:00:00 1 mg via INTRAVENOUS
  Administered 2013-03-29 (×2): 2 mg via INTRAVENOUS

## 2013-03-29 MED ORDER — SODIUM CHLORIDE 0.9 % IV SOLN: INTRAVENOUS | Status: DC

## 2013-03-29 MED ORDER — LIDOCAINE VISCOUS 2 % MT SOLN
OROMUCOSAL | Status: DC | PRN
  Administered 2013-03-29: 11:00:00 10 mL via OROMUCOSAL

## 2013-03-29 MED ORDER — FENTANYL CITRATE 0.05 MG/ML IJ SOLN
INTRAMUSCULAR | Status: DC | PRN
  Administered 2013-03-29 (×3): 25 ug via INTRAVENOUS

## 2013-03-29 MED ORDER — LIDOCAINE VISCOUS 2 % MT SOLN
OROMUCOSAL | Status: AC
  Filled 2013-03-29: qty 15

## 2013-03-29 NOTE — Op Note (Signed)
[facility] [address] [place], [address]   OPERATIVE PROCEDURE REPORT  PATIENT :Tonya Wise, Tonya Wise  MR#: [id number] BIRTHDATE :07-26-1941 GENDER: Female ENDOSCOPIST: Dr.  Lorenza Burton, MD ASSISTANT:   Beryle Beams, technician Claudie Revering, RN CGRN PROCEDURE DATE: April 15, 2013 PRE-PROCEDURE PREPERATION: Patient fasted for 8 hours prior to the procedure. PRE-PROCEDURE PHYSICAL: Patient has stable vital signs.  Neck is supple.  There is no JVD, thyromegaly or LAD.  Chest clear to auscultation.  S1 and S2 regular.  Abdomen soft, non-distended with epigastric tenderness on palpation with NABS. PROCEDURE:     EGD with cold biopsies. ASA CLASS:     Class II INDICATIONS:     Iron deficiency anemia and epigastric pain.Marland Kitchen MEDICATIONS:     Fentanyl 75 mcg and Versed 4 mg IV TOPICAL ANESTHETIC:   Viscous xylocaine-15 cc PO.  DESCRIPTION OF PROCEDURE: After the risks benefits and alternatives of the procedure were thoroughly explained, informed consent was obtained.  The Pentax Gastroscope [id number]  was introduced through the mouth and advanced to the second portion of the duodenum , without limitations. The instrument was slowly withdrawn as the mucosa was fully examined.   The esophagus, stomach and the proximal small bowel appeared normal. There were no ulcers, erosions, masses or polyps noted. Retroflexed views revealed no abnormalities. Small bowel biopsies were done to rule out sprue. The scope was then withdrawn from the patient and the procedure terminated. The patient tolerated the procedure without immediate complications.  IMPRESSION:  Normal esophagogastroduodenoscopy; small bowel biopsies done to rule out sprue.  RECOMMENDATIONS:     1.  Anti-reflux regimen to be followed. 2.  Await biopsy results. 3.  Avoid all NSAIDS for now. 4.  Continue current medications. 5. Regular diet.   REPEAT EXAM:  no recall planned for now.  DISCHARGE  INSTRUCTIONS: standard instructions given.  _______________________________ eSigned:  Dr. Lorenza Burton, MD Apr 15, 2013 11:43 AM   CPT CODES:     [phone], Colonoscopy with Biopsy  DIAGNOSIS CODES:     280.9 Iron Deficiency Anemia   CC: Jeani Hawking, M.D.  PATIENT NAME:  Tonya Wise, Tonya Wise MR#: [id number]

## 2013-03-29 NOTE — Progress Notes (Signed)
Patient continued to have increased fever with administering tylenol. Patient NPO since midnight for scheduled EGD today. Consent signed in chart. Patient states feels achey all over.

## 2013-03-29 NOTE — Progress Notes (Signed)
500 CC 0.9% SALINE INFUSED DURING PROCEDURE AND RECOVERY >B. Unk Lightning.N.

## 2013-03-29 NOTE — Progress Notes (Signed)
PATIENT DETAILS Name: Tonya Wise Age: 72 y.o. Sex: female Date of Birth: April 06, 1941 Admit Date: 03/27/2013 Admitting Physician Jeralyn Bennett, MD ZOX:[id number] Leonette Most, MD  Subjective: Fever overnight  Assessment/Plan: Active Problems: Anemia  -suspected-to be from recent blood loss-likely a GI bleed-interestingly FOBT neg on admission-but has Microcytic anemia that is new. -admitted, and given 3 units of PRBC -Hb now 10.3 -needs GI work up-EGD today. If GI work up neg-then we can consider further work up for anemia.  Suspected upper GI bleed -no active bleeding-FOBT neg on admit-However has Microcytic anemia which is new -was on NSAID's steroids recently -c/w PPI -EGD today  Acute on chronic renal failure -likely secondary to prerenal azotemia in setting of volume depletion from GI bleed -better with IVF  Fever -?etiology -UA neg for UTI, CXR neg for PNA-no foci evident -blood cultures on 8/30-pending -if EGD and blood neg-may need work up for occult malignancy as a potential cause -will doppler legs, may need CT Scan of chest and Abd -Empiric Rocephin/Flagyl started on 8/30-but suspect we can just watch her off antibiotics.  Gastroesophageal reflux disease  -On IV PPI therapy-change to oral PPI on discharge  Mild Hypercalcemia -better with IVF -monitor -given mild renal failure, anemia-SPEP/UPEP pending -when corrected to albumin-Ca is still high  HTN -currently controlled without the use of any antihypertensive medications -resume when able  Disposition: Remain inpatient  DVT Prophylaxis:  SCD's  Code Status: Full code   Family Communication Husband at bedside  Procedures:  None  CONSULTS:  GI   MEDICATIONS: Scheduled Meds: . cefTRIAXone (ROCEPHIN)  IV  1 g Intravenous Q24H  . metronidazole  500 mg Intravenous Q8H  . pantoprazole (PROTONIX) IV  40 mg Intravenous Q12H  . sodium chloride  3 mL Intravenous Q12H   Continuous  Infusions:  PRN Meds:.acetaminophen, acetaminophen, ondansetron (ZOFRAN) IV, ondansetron, oxyCODONE  Antibiotics: Anti-infectives   Start     Dose/Rate Route Frequency Ordered Stop   03/29/13 0000  cefTRIAXone (ROCEPHIN) 1 g in dextrose 5 % 50 mL IVPB     1 g 100 mL/hr over 30 Minutes Intravenous Every 24 hours 03/28/13 2251     03/28/13 2245  metroNIDAZOLE (FLAGYL) IVPB 500 mg     500 mg 100 mL/hr over 60 Minutes Intravenous 3 times per day 03/28/13 2228         PHYSICAL EXAM: Vital signs in last 24 hours: Filed Vitals:   03/28/13 2057 03/28/13 2343 03/29/13 0447 03/29/13 1031  BP: 128/71  136/71   Pulse: 108  107   Temp: 102.2 F (39 C) 98.6 F (37 C) 101.1 F (38.4 C) 98.6 F (37 C)  TempSrc: Oral  Oral Oral  Resp: 17  17   Height:      Weight:   71 kg (156 lb 8.4 oz)   SpO2: 92%  92%     Weight change: -0.215 kg (-7.6 oz) Filed Weights   03/28/13 0420 03/28/13 0511 03/29/13 0447  Weight: 71.1 kg (156 lb 12 oz) 72.6 kg (160 lb 0.9 oz) 71 kg (156 lb 8.4 oz)   Body mass index is 28.62 kg/(m^2).   Gen Exam: Awake and alert with clear speech.   Neck: Supple, No JVD.   Chest: B/L Clear.   CVS: S1 S2 Regular Abdomen: soft, BS +, non tender, non distended.  Extremities: no edema, lower extremities warm to touch Neurologic: Non Focal.   Skin: No Rash.   Wounds: N/A.    Intake/Output from previous  day:  Intake/Output Summary (Last 24 hours) at 03/29/13 1039 Last data filed at 03/29/13 0602  Gross per 24 hour  Intake    570 ml  Output    350 ml  Net    220 ml     LAB RESULTS: CBC  Recent Labs Lab 03/26/13 1338 03/27/13 1206 03/28/13 0400 03/29/13 0459  WBC 5.5 6.9 4.8 4.6  HGB 7.9* 6.4* 10.3* 10.6*  HCT 26.2* 21.3* 31.4* 32.5*  PLT 237 240 163 164  MCV 72.8* 75.0* 76.2* 77.2*  MCH 21.9* 22.5* 25.0* 25.2*  MCHC 30.2 30.0 32.8 32.6  RDW 17.6* 17.3* 16.4* 17.2*  LYMPHSABS 0.8 0.9  --   --   MONOABS 0.8 1.1*  --   --   EOSABS 0.0 0.0  --   --    BASOSABS 0.0 0.0  --   --     Chemistries   Recent Labs Lab 03/26/13 1338 03/27/13 1206 03/28/13 0400 03/29/13 0459  NA 135 133* 137 136  K 4.5 4.2 3.7 3.7  CL 95* 92* 97 97  CO2 28 26 26 26   GLUCOSE 139* 233* 125* 123*  BUN 18 17 14 13   CREATININE 1.37* 1.60* 1.42* 1.21*  CALCIUM 9.9 11.2* 10.1 10.3    CBG: No results found for this basename: GLUCAP,  in the last 168 hours  GFR Estimated Creatinine Clearance: 39.4 ml/min (by C-G formula based on Cr of 1.21).  Coagulation profile  Recent Labs Lab 03/27/13 1206  INR 1.33    Cardiac Enzymes  Recent Labs Lab 03/27/13 1206  TROPONINI <0.30    No components found with this basename: POCBNP,  No results found for this basename: DDIMER,  in the last 72 hours  Recent Labs  03/28/13 0400  HGBA1C 6.3*   No results found for this basename: CHOL, HDL, LDLCALC, TRIG, CHOLHDL, LDLDIRECT,  in the last 72 hours No results found for this basename: TSH, T4TOTAL, FREET3, T3FREE, THYROIDAB,  in the last 72 hours No results found for this basename: VITAMINB12, FOLATE, FERRITIN, TIBC, IRON, RETICCTPCT,  in the last 72 hours  Recent Labs  03/27/13 1206  LIPASE 54    Urine Studies No results found for this basename: UACOL, UAPR, USPG, UPH, UTP, UGL, UKET, UBIL, UHGB, UNIT, UROB, ULEU, UEPI, UWBC, URBC, UBAC, CAST, CRYS, UCOM, BILUA,  in the last 72 hours  MICROBIOLOGY: No results found for this or any previous visit (from the past 240 hour(s)).  RADIOLOGY STUDIES/RESULTS: Dg Chest 2 View  03/27/2013   *RADIOLOGY REPORT*  Clinical Data: Shortness of breath  CHEST - 2 VIEW  Comparison: 03/12/13  Findings: The heart and pulmonary vascularity are within normal limits.  The lungs are clear bilaterally.  No acute bony abnormality is seen.  IMPRESSION: No acute abnormality noted.   Original Report Authenticated By: Alcide Clever, M.D.   Dg Chest 2 View  03/12/2013   *RADIOLOGY REPORT*  Clinical Data: Cough  CHEST - 2 VIEW   Comparison: 03/09/2011  Findings: Cardiomediastinal silhouette is stable.  No acute infiltrate or pleural effusion. Central mild bronchitic changes. No pulmonary edema.  Partially visualized postsurgical changes lower lumbar spine.  IMPRESSION: Central mild bronchitic changes. No acute infiltrate or pulmonary edema.   Original Report Authenticated By: Natasha Mead, M.D.    Jeoffrey Massed, MD  [facility] Pager:[phone]  If 7PM-7AM, please contact night-coverage [email or web address] Password [facility] 03/29/2013, 10:39 AM   LOS: 2 days

## 2013-03-29 NOTE — Progress Notes (Signed)
Notified hospitalist of increased temp-102.2. New orders received.

## 2013-03-30 ENCOUNTER — Encounter (HOSPITAL_COMMUNITY): Payer: Self-pay | Admitting: Gastroenterology

## 2013-03-30 ENCOUNTER — Inpatient Hospital Stay (HOSPITAL_COMMUNITY): Payer: Medicare Other

## 2013-03-30 DIAGNOSIS — R599 Enlarged lymph nodes, unspecified: Secondary | ICD-10-CM | POA: Diagnosis not present

## 2013-03-30 DIAGNOSIS — R161 Splenomegaly, not elsewhere classified: Secondary | ICD-10-CM

## 2013-03-30 DIAGNOSIS — R509 Fever, unspecified: Secondary | ICD-10-CM

## 2013-03-30 DIAGNOSIS — M7989 Other specified soft tissue disorders: Secondary | ICD-10-CM

## 2013-03-30 LAB — DIRECT ANTIGLOBULIN TEST (NOT AT ARMC)
DAT, IgG: NEGATIVE
DAT, complement: NEGATIVE

## 2013-03-30 LAB — COMPREHENSIVE METABOLIC PANEL
Albumin: 2 g/dL — ABNORMAL LOW (ref 3.5–5.2)
Alkaline Phosphatase: 139 U/L — ABNORMAL HIGH (ref 39–117)
BUN: 12 mg/dL (ref 6–23)
Chloride: 98 mEq/L (ref 96–112)
Creatinine, Ser: 1.11 mg/dL — ABNORMAL HIGH (ref 0.50–1.10)
GFR calc Af Amer: 57 mL/min — ABNORMAL LOW (ref >90)
Glucose, Bld: 118 mg/dL — ABNORMAL HIGH (ref 70–99)
Total Bilirubin: 0.7 mg/dL (ref 0.3–1.2)

## 2013-03-30 LAB — CBC
HCT: 31.4 % — ABNORMAL LOW (ref 36.0–46.0)
Hemoglobin: 9.9 g/dL — ABNORMAL LOW (ref 12.0–15.0)
MCHC: 31.5 g/dL (ref 30.0–36.0)
MCV: 78.1 fL (ref 78.0–100.0)
RDW: 17.4 % — ABNORMAL HIGH (ref 11.5–15.5)
WBC: 4.1 10*3/uL (ref 4.0–10.5)

## 2013-03-30 MED ORDER — IOHEXOL 300 MG/ML  SOLN
25.0000 mL | INTRAMUSCULAR | Status: AC
  Administered 2013-03-30 (×2): 25 mL via ORAL

## 2013-03-30 MED ORDER — SODIUM CHLORIDE 0.9 % IV SOLN
INTRAVENOUS | Status: DC
  Administered 2013-03-30 – 2013-03-31 (×4): via INTRAVENOUS

## 2013-03-30 MED ORDER — BISOPROLOL FUMARATE 10 MG PO TABS
10.0000 mg | ORAL_TABLET | Freq: Every day | ORAL | Status: DC
  Administered 2013-03-30 – 2013-04-01 (×3): 10 mg via ORAL
  Filled 2013-03-30 (×3): qty 1

## 2013-03-30 MED ORDER — IOHEXOL 300 MG/ML  SOLN
100.0000 mL | Freq: Once | INTRAMUSCULAR | Status: AC | PRN
  Administered 2013-03-30: 100 mL via INTRAVENOUS

## 2013-03-30 MED ORDER — IOHEXOL 300 MG/ML  SOLN: 25.0000 mL | INTRAMUSCULAR | Status: DC

## 2013-03-30 NOTE — Progress Notes (Signed)
C/o nausea; zofran given prn

## 2013-03-30 NOTE — Consult Note (Signed)
[facility]  [phone] [phone]     INITIAL HEMATOLOGY CONSULTATION    Referral MD:   Werner Lean   Reason for Referral:  1. Anemia. 2. Hypercalcemia.  HPI:  Tonya Wise is a 72 y.o Caucasian female who was send by PCP to ED because of anemia (Hgb 6.4. Patient reported that she started to feel not good about 2 month ago, when she lost appetite and started to lose weight. She became weak and fatigue. She also developed cough with green sputum and dyspnea. Patient also noticed new headaches, diminished hearing. She also complains on feeling feverish, having nasal congestion and white nasal discharge, feeling some lump in throat. She also complains on low and some night sweats.er abdominal pain, episodes of nausea, low back pain.. Patient reported history of colonoscopy and EGD several years ago. She received 3 units of RBC. GI consult was done. EGD was done and it was negative according Dr. Werner Lean  Notes. Hematology consult was requested.  Review of Systems  Constitutional: Positive for fever, chills, weight loss, malaise/fatigue and diaphoresis.  HENT: Positive for hearing loss and congestion. Negative for ear pain, nosebleeds, sore throat, neck pain and tinnitus.   Eyes: Negative for blurred vision, double vision, photophobia and pain.  Respiratory: Positive for cough, sputum production, shortness of breath and stridor. Negative for hemoptysis and wheezing.   Cardiovascular: Negative for chest pain, palpitations, orthopnea, claudication, leg swelling and PND.  Gastrointestinal: Positive for nausea and abdominal pain. Negative for heartburn, vomiting, diarrhea, constipation, blood in stool and melena.  Genitourinary: Negative for dysuria, urgency, frequency, hematuria and flank pain.  Musculoskeletal: Positive for back pain. Negative for myalgias and joint pain.  Skin: Negative for itching and rash.  Neurological: Positive for dizziness,  weakness and headaches. Negative for tingling, tremors, sensory change, focal weakness, seizures and loss of consciousness.  Endo/Heme/Allergies: Does not bruise/bleed easily.  Psychiatric/Behavioral: Negative.     Past Medical History  Diagnosis Date  . Hypertension   . Renal insufficiency   . Osteopenia   . Allergy   . Heart murmur   . GERD (gastroesophageal reflux disease)   . Anemia   . Hypercalcemia 03/27/2013  :    Past Surgical History  Procedure Laterality Date  . Appendectomy    . Colonoscopy  2008  . Spine surgery    . Back surgery      LOWER BACK TWICE  . Abdominal hysterectomy    . Esophagogastroduodenoscopy N/A 03/29/2013    Procedure: ESOPHAGOGASTRODUODENOSCOPY (EGD);  Surgeon: Charna Elizabeth, MD;  Location: [facility] ENDOSCOPY;  Service: Endoscopy;  Laterality: N/A;  :   CURRENT MEDS: Current Facility-Administered Medications  Medication Dose Route Frequency Provider Last Rate Last Dose  . 0.9 %  sodium chloride infusion   Intravenous Continuous Maretta Bees, MD 75 mL/hr at 03/30/13 0851    . acetaminophen (TYLENOL) tablet 650 mg  650 mg Oral Q6H PRN Jeralyn Bennett, MD   650 mg at 03/29/13 0451   Or  . acetaminophen (TYLENOL) suppository 650 mg  650 mg Rectal Q6H PRN Jeralyn Bennett, MD   650 mg at 03/30/13 1613  . bisoprolol (ZEBETA) tablet 10 mg  10 mg Oral Daily Maretta Bees, MD   10 mg at 03/30/13 1140  . ondansetron (ZOFRAN) tablet 4 mg  4 mg Oral Q6H PRN Jeralyn Bennett, MD       Or  . ondansetron (ZOFRAN) injection 4 mg  4 mg Intravenous Q6H  PRN Jeralyn Bennett, MD   4 mg at 03/30/13 1036  . oxyCODONE (Oxy IR/ROXICODONE) immediate release tablet 5 mg  5 mg Oral Q4H PRN Jeralyn Bennett, MD      . pantoprazole (PROTONIX) injection 40 mg  40 mg Intravenous Q12H Jeralyn Bennett, MD   40 mg at 03/30/13 1000  . sodium chloride 0.9 % injection 3 mL  3 mL Intravenous Q12H Jeralyn Bennett, MD   3 mL at 03/30/13 1000      Allergies  Allergen Reactions    . Penicillins Rash  :  Family History  Problem Relation Age of Onset  . Heart attack Father 70  . Leukemia Mother   . Diabetes Brother   . Diabetes Sister   :  History   Social History  . Marital Status: Married    Spouse Name: N/A    Number of Children: N/A  . Years of Education: N/A   Occupational History  . Not on file.   Social History Main Topics  . Smoking status: Former Smoker -- 0.50 packs/day for 2 years    Types: Cigarettes    Quit date: 07/30/1960  . Smokeless tobacco: Never Used  . Alcohol Use: Yes     Comment: rare-wine drinker  . Drug Use: No  . Sexual Activity: Yes    Partners: Male   Other Topics Concern  . Not on file   Social History Narrative  . No narrative on file   Exam: Patient Vitals for the past 24 hrs:  BP Temp Temp src Pulse Resp SpO2  03/30/13 2058 123/73 mmHg - Oral 95 20 94 %  03/30/13 1731 - 98.6 F (37 C) Oral - - -  03/30/13 1604 134/85 mmHg 103 F (39.4 C) Oral 109 18 94 %  03/30/13 0850 - 99.2 F (37.3 C) Oral - - -  03/30/13 0617 131/77 mmHg 100.8 F (38.2 C) Axillary 131 20 94 %    General:  well-nourished in no acute distress.  Eyes:  no scleral icterus.  ENT:  There were no oropharyngeal lesions.  Neck was without thyromegaly.  Lymphatics:  Negative cervical, supraclavicular. Patient has axillary adenopathy.  Respiratory: lungs were clear bilaterally without wheezing or crackles.  Cardiovascular:  Regular rate and rhythm, S1/S2, without murmur, rub or gallop.  There was no pedal edema.  GI:  abdomen was soft, flat, nontender, nondistended. Splenomegaly.  Musculoskeletal:  no calf tenderness.  Skin exam was without ecchymosis, petechiae.  Neuro exam was nonfocal.   Patient was alerted and oriented.  Attention was good.   Language was appropriate.  Mood was normal without depression.  Speech was not pressured.  Thought content was not tangential.    LABS:  Lab Results  Component Value Date   WBC 4.1 03/30/2013   HGB 9.9*  03/30/2013   HCT 31.4* 03/30/2013   PLT 143* 03/30/2013   GLUCOSE 118* 03/30/2013   ALT 8 03/30/2013   AST 24 03/30/2013   NA 135 03/30/2013   K 3.7 03/30/2013   CL 98 03/30/2013   CREATININE 1.11* 03/30/2013   BUN 12 03/30/2013   CO2 24 03/30/2013   INR 1.33 03/27/2013   HGBA1C 6.3* 03/28/2013    Dg Chest 2 View  03/27/2013   *RADIOLOGY REPORT*  Clinical Data: Shortness of breath  CHEST - 2 VIEW  Comparison: 03/12/13  Findings: The heart and pulmonary vascularity are within normal limits.  The lungs are clear bilaterally.  No acute bony abnormality is seen.  IMPRESSION: No acute abnormality noted.   Original Report Authenticated By: Alcide Clever, M.D.   Dg Chest 2 View  03/12/2013   *RADIOLOGY REPORT*  Clinical Data: Cough  CHEST - 2 VIEW  Comparison: 03/09/2011  Findings: Cardiomediastinal silhouette is stable.  No acute infiltrate or pleural effusion. Central mild bronchitic changes. No pulmonary edema.  Partially visualized postsurgical changes lower lumbar spine.  IMPRESSION: Central mild bronchitic changes. No acute infiltrate or pulmonary edema.   Original Report Authenticated By: Natasha Mead, M.D.   Ct Chest W Contrast  03/30/2013   CLINICAL DATA:  72 year old female with fever of unknown origin, anemia, blood loss of unknown etiology, possible malignancy, renal insufficiency, hypertension.  EXAM: CT CHEST, ABDOMEN, AND PELVIS WITH CONTRAST  TECHNIQUE: Multidetector CT imaging of the chest, abdomen and pelvis was performed following the standard protocol during bolus administration of intravenous contrast.  CONTRAST:  OMNIPAQUE IOHEXOL 300 MG/ML  SOLN  COMPARISON:  Chest radiographs 03/28/2013 and earlier. Abdominal radiographs 11/25/2004.  FINDINGS: CT CHEST FINDINGS  Major airways are patent. No pericardial or pleural effusion. In the posterior right upper lobe there is mild reticular nodular Peribronchovascular opacity which appears to be inflammatory (series 3, image 17). Otherwise, the lungs are clear.   No acute or suspicious osseous abnormality identified in the chest.  Incidental breast implants.  Negative visualized thoracic inlet. However, there is hilar and mediastinal lymphadenopathy. Right peritracheal node measures up to 15 mm short axis. Hilar nodes measure up to 20 mm individually right greater than left. Bulky subcarinal lymphadenopathy, measuring up to 22 mm. There is also a right greater than left Mild axillary lymphadenopathy, with individual nodes measuring up tip 20 mm short axis. No breast soft tissue mass identified.  Major mediastinal vascular structures remain patent.  CT ABDOMEN AND PELVIS FINDINGS  Splenomegaly with permeative/miliary hypodensity scattered in the splenic parenchyma. Bulky right greater than left retroperitoneal lymphadenopathy, with AE right aortocaval nodal conglomeration measuring up to 36 mm short axis. Less pronounced splenic hilum, gastrohepatic ligament, and porta hepatis lymphadenopathy. Retroperitoneal lymphadenopathy continues into the pelvis. Left iliac bifurcation node measures 23 mm short axis. Right inguinal rounded lymph node measures 24 mm short axis. Smaller round inguinal lymph nodes on the left. Abdominal mesenteric nodes also appear enlarged measuring up to 12 mm short axis.  Trace pelvic free fluid. No abdominal free fluid. Oral contrast has reached the rectum. Uterus and adnexal within normal limits. Unremarkable bladder. Redundant left colon. Negative right colon and appendix. No dilated small bowel. Decompressed stomach. Negative pancreas, adrenal glands, kidneys, and gallbladder. Low density throughout the liver suggesting steatosis. Major arterial structures in the abdomen and pelvis are patent. Portal venous system within normal limits.  Sequelae of L3-L4 and L4-L5 fusion. 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 No acute or suspicious osseous  lesion in the abdomen or pelvis.  IMPRESSION: CT CHEST IMPRESSION  1. Mediastinal, hilar, and axillary lymphadenopathy, in conjunction with the abdomen and pelvis findings, most compatible with non-Hodgkin's lymphoma.  2. Trace abnormal lung opacity in the posterior right upper lobe may reflect mild acute infection or chronic postinflammatory change.  CT ABDOMEN AND PELVIS IMPRESSION  1. 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  2. Abdominal and pelvic lymphadenopathy maximal in the retroperitoneum and right inguinal nodal stations.  3. 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  4. Hepatic steatosis.   Electronically Signed   By: Augusto Gamble   On: 03/30/2013 20:31   US Abdomen Complete  03/30/2013   *RADIOLOGY REPORT*  Clinical Data:  Elevated liver function tests  COMPLETE ABDOMINAL ULTRASOUND  Comparison:  None.  Findings:  Gallbladder:  Well distended with evidence of gallbladder sludge. No definitive stones are seen.  Wall thickening is noted.  Common bile duct:  5.1 mm.  This is within normal limits.  Liver:  A focal area of increased echogenicity is identified adjacent to the gallbladder fossa likely related to focal fatty infiltration or a small hemangioma.  IVC:  Appears normal.  Pancreas:  No focal abnormality seen.  Spleen:  13.4 cm.  Diffusely heterogeneous  and mildly enlarged.  Right Kidney:  10.7 cm.  No mass lesion or hydronephrosis is noted.  Left Kidney:  11 cm without mass lesion or hydronephrosis.  Abdominal aorta:  No aneurysm identified.  Adjacent to the aorta near the origin of the superior mesenteric artery there is a 4.5 by 2.5 cm hypoechoic area which may represent a lymph node.  IMPRESSION: Gallbladder sludge.  Hyper echoic lesion within the liver of uncertain significance.  Splenomegaly with diffuse heterogeneity.  Hypoechoic area  adjacent to the proximal aorta of uncertain significance.  CT of the abdomen and pelvis with contrast may be helpful for further evaluation.   Original Report Authenticated By: Alcide Clever, M.D.   Ct Abdomen Pelvis W Contrast  03/30/2013   CLINICAL DATA:  72 year old female with fever of unknown origin, anemia, blood loss of unknown etiology, possible malignancy, renal insufficiency, hypertension.  EXAM: CT CHEST, ABDOMEN, AND PELVIS WITH CONTRAST  TECHNIQUE: Multidetector CT imaging of the chest, abdomen and pelvis was performed following the standard protocol during bolus administration of intravenous contrast.  CONTRAST:  OMNIPAQUE IOHEXOL 300 MG/ML  SOLN  COMPARISON:  Chest radiographs 03/28/2013 and earlier. Abdominal radiographs 11/25/2004.  FINDINGS: CT CHEST FINDINGS  Major airways are patent. No pericardial or pleural effusion. In the posterior right upper lobe there is mild reticular nodular Peribronchovascular opacity which appears to be inflammatory (series 3, image 17). Otherwise, the lungs are clear.  No acute or suspicious osseous abnormality identified in the chest.  Incidental breast implants.  Negative visualized thoracic inlet. However, there is hilar and mediastinal lymphadenopathy. Right peritracheal node measures up to 15 mm short axis. Hilar nodes measure up to 20 mm individually right greater than left. Bulky subcarinal lymphadenopathy, measuring up to 22 mm. There is also a right greater than left Mild axillary lymphadenopathy, with individual nodes measuring up tip 20 mm short axis. No breast soft tissue mass identified.  Major mediastinal vascular structures remain patent.  CT ABDOMEN AND PELVIS FINDINGS  Splenomegaly with permeative/miliary hypodensity scattered in the splenic parenchyma. Bulky right greater than left retroperitoneal lymphadenopathy, with AE right aortocaval nodal conglomeration measuring up to 36 mm short axis. Less pronounced splenic hilum, gastrohepatic  ligament, and porta hepatis lymphadenopathy. Retroperitoneal lymphadenopathy continues into the pelvis. Left iliac bifurcation node measures 23 mm short axis. Right inguinal rounded lymph node measures 24 mm short axis. Smaller round inguinal lymph nodes on the left. Abdominal mesenteric nodes also appear enlarged measuring up to 12 mm short axis.  Trace pelvic free fluid. No abdominal free fluid. Oral contrast has reached the rectum. Uterus and adnexal within normal limits. Unremarkable bladder. Redundant left colon. Negative right colon and appendix. No dilated small  bowel. Decompressed stomach. Negative pancreas, adrenal glands, kidneys, and gallbladder. Low density throughout the liver suggesting steatosis. Major arterial structures in the abdomen and pelvis are patent. Portal venous system within normal limits.  Sequelae of L3-L4 and L4-L5 fusion. 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 No acute or suspicious osseous lesion in the abdomen or pelvis.  IMPRESSION: CT CHEST IMPRESSION  1. Mediastinal, hilar, and axillary lymphadenopathy, in conjunction with the abdomen and pelvis findings, most compatible with non-Hodgkin's lymphoma.  2. Trace abnormal lung opacity in the posterior right upper lobe may reflect mild acute infection or chronic postinflammatory change.  CT ABDOMEN AND PELVIS IMPRESSION  1. 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  2. Abdominal and pelvic lymphadenopathy maximal in the retroperitoneum and right inguinal nodal stations.  3. 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  4. Hepatic steatosis.   Electronically Signed   By: Augusto Gamble   On: 03/30/2013 20:31   Dg Chest Port 1 View  03/28/2013   CLINICAL DATA:  Fever  EXAM:  PORTABLE CHEST - 1 VIEW  COMPARISON:  03/27/2013  FINDINGS: Decreasing lung volumes without focal airspace opacity. Heart is normal size. No effusions. No acute bony abnormality.  IMPRESSION: No active disease.   Electronically Signed   By: Charlett Nose   On: 03/28/2013 22:21    ASSESSMENT AND PLAN:  1. Anemia. 2. Hypercalcemia. 3. Abnormal CT of chest, abdomen and pelvis with significant lymphadenopathy. 4. Splenomegaly. Presentation is suspicious for lymphoma. I would recommended to arrange excisional biopsy of abnormal lymph node. Useful blood work  would be: iron panel, protein electrophoresis with immunofixation  Myra Rude, MD, PhD.  Thank you for this referral.

## 2013-03-30 NOTE — Progress Notes (Signed)
PATIENT DETAILS Name: Tonya Wise Age: 72 y.o. Sex: female Date of Birth: 1941/07/14 Admit Date: 03/27/2013 Admitting Physician Jeralyn Bennett, MD WUJ:[id number] Leonette Most, MD  Subjective: Continues to have fever. No other complaints.  Assessment/Plan: Active Problems: Anemia  -suspected initially to be from recent blood loss, although a FOBT was neg on admission. EGD done on 8/31 did not show any foci for bleeding. She recently had a colonoscopy a few months ago, that was essentially negative. At this time GI does not recommend any further endoscopic evaluation. At this time, anemia continues to be unexplained, and further workup has been initiated.  -On admission, she was  given 3 units of PRBC with appropriate rise in hemoglobin. Hemoglobin and hematocrit continued to be stable since then - LDH significantly elevated at 380, haptoglobin also negative. Coombs test negative. SPEP/UPEP pending. Have consulted hematology/oncology for further assistance and recommendation - Given mild hypercalcemia, unexplained fevers-differential is vast but obviously an occult malignancy is of concern  Suspected upper GI bleed -no active bleeding-FOBT neg on admit-However has Microcytic anemia which is new -was on NSAID's steroids recently -c/w PPI -EGD on 8/31 did not show any foci for bleeding. At this time her recent upper GI bleed appears unlikely.  Acute on chronic renal failure -likely secondary to prerenal azotemia in setting of volume depletion from GI bleed -better with IVF - As noted above, SPEP/UPEP pending  Fever -?etiology-? Malignancy -UA neg for UTI, CXR neg for PNA-no foci evident -blood cultures on 8/30- negative so far - Since EGD and blood neg-may need work up for occult malignancy as a potential cause- therefore we'll go ahead and get a CT of the chest and abdomen. Ultrasound of the abdomen showed a questionable enlarged lymph node in the aorta with a mildly enlarged  spleen. - Awaiting  doppler of legs - Since cultures negative so far, monitor off antibiotics. Will stop Rocephin  Gastroesophageal reflux disease  -On IV PPI therapy-change to oral PPI on discharge  Mild Hypercalcemia -better with IVF -monitor -given mild renal failure, anemia-SPEP/UPEP pending -when corrected to albumin-Ca is still high  HTN -currently controlled without the use of any antihypertensive medications -resume bisoprolol today  Disposition: Remain inpatient  DVT Prophylaxis:  SCD's  Code Status: Full code   Family Communication 2 daughters at bedside  Procedures:  None  CONSULTS:  GI Hematology/oncology   MEDICATIONS: Scheduled Meds: . bisoprolol  10 mg Oral Daily  . pantoprazole (PROTONIX) IV  40 mg Intravenous Q12H  . sodium chloride  3 mL Intravenous Q12H   Continuous Infusions: . sodium chloride 75 mL/hr at 03/30/13 0851   PRN Meds:.acetaminophen, acetaminophen, ondansetron (ZOFRAN) IV, ondansetron, oxyCODONE  Antibiotics: Anti-infectives   Start     Dose/Rate Route Frequency Ordered Stop   03/29/13 0000  cefTRIAXone (ROCEPHIN) 1 g in dextrose 5 % 50 mL IVPB  Status:  Discontinued     1 g 100 mL/hr over 30 Minutes Intravenous Every 24 hours 03/28/13 2251 03/30/13 1033   03/28/13 2245  metroNIDAZOLE (FLAGYL) IVPB 500 mg  Status:  Discontinued     500 mg 100 mL/hr over 60 Minutes Intravenous 3 times per day 03/28/13 2228 03/29/13 1205       PHYSICAL EXAM: Vital signs in last 24 hours: Filed Vitals:   03/29/13 1818 03/29/13 2230 03/30/13 0617 03/30/13 0850  BP:  126/73 131/77   Pulse:  123 131   Temp: 100.1 F (37.8 C) 99.5 F (37.5 C) 100.8 F (38.2 C) 99.2  F (37.3 C)  TempSrc:  Oral Axillary Oral  Resp:  20 20   Height:      Weight:      SpO2:  93% 94%     Weight change:  Filed Weights   03/28/13 0420 03/28/13 0511 03/29/13 0447  Weight: 71.1 kg (156 lb 12 oz) 72.6 kg (160 lb 0.9 oz) 71 kg (156 lb 8.4 oz)   Body  mass index is 28.62 kg/(m^2).   Gen Exam: Awake and alert with clear speech.   Neck: Supple, No JVD.   Chest: B/L Clear.   CVS: S1 S2 Regular Abdomen: soft, BS +, non tender, non distended.  Extremities: no edema, lower extremities warm to touch Neurologic: Non Focal.   Skin: No Rash.   Wounds: N/A.    Intake/Output from previous day:  Intake/Output Summary (Last 24 hours) at 03/30/13 1137 Last data filed at 03/30/13 1000  Gross per 24 hour  Intake      3 ml  Output   1200 ml  Net  -1197 ml     LAB RESULTS: CBC  Recent Labs Lab 03/26/13 1338 03/27/13 1206 03/28/13 0400 03/29/13 0459 03/30/13 0540  WBC 5.5 6.9 4.8 4.6 4.1  HGB 7.9* 6.4* 10.3* 10.6* 9.9*  HCT 26.2* 21.3* 31.4* 32.5* 31.4*  PLT 237 240 163 164 143*  MCV 72.8* 75.0* 76.2* 77.2* 78.1  MCH 21.9* 22.5* 25.0* 25.2* 24.6*  MCHC 30.2 30.0 32.8 32.6 31.5  RDW 17.6* 17.3* 16.4* 17.2* 17.4*  LYMPHSABS 0.8 0.9  --   --   --   MONOABS 0.8 1.1*  --   --   --   EOSABS 0.0 0.0  --   --   --   BASOSABS 0.0 0.0  --   --   --     Chemistries   Recent Labs Lab 03/26/13 1338 03/27/13 1206 03/28/13 0400 03/29/13 0459 03/30/13 0540  NA 135 133* 137 136 135  K 4.5 4.2 3.7 3.7 3.7  CL 95* 92* 97 97 98  CO2 28 26 26 26 24   GLUCOSE 139* 233* 125* 123* 118*  BUN 18 17 14 13 12   CREATININE 1.37* 1.60* 1.42* 1.21* 1.11*  CALCIUM 9.9 11.2* 10.1 10.3 9.9    CBG: No results found for this basename: GLUCAP,  in the last 168 hours  GFR Estimated Creatinine Clearance: 42.9 ml/min (by C-G formula based on Cr of 1.11).  Coagulation profile  Recent Labs Lab 03/27/13 1206  INR 1.33    Cardiac Enzymes  Recent Labs Lab 03/27/13 1206  TROPONINI <0.30    No components found with this basename: POCBNP,  No results found for this basename: DDIMER,  in the last 72 hours  Recent Labs  03/28/13 0400  HGBA1C 6.3*   No results found for this basename: CHOL, HDL, LDLCALC, TRIG, CHOLHDL, LDLDIRECT,  in the  last 72 hours No results found for this basename: TSH, T4TOTAL, FREET3, T3FREE, THYROIDAB,  in the last 72 hours No results found for this basename: VITAMINB12, FOLATE, FERRITIN, TIBC, IRON, RETICCTPCT,  in the last 72 hours  Recent Labs  03/27/13 1206  LIPASE 54    Urine Studies No results found for this basename: UACOL, UAPR, USPG, UPH, UTP, UGL, UKET, UBIL, UHGB, UNIT, UROB, ULEU, UEPI, UWBC, URBC, UBAC, CAST, CRYS, UCOM, BILUA,  in the last 72 hours  MICROBIOLOGY: Recent Results (from the past 240 hour(s))  CULTURE, BLOOD (ROUTINE X 2)     Status: None  Collection Time    03/28/13 10:40 PM      Result Value Range Status   Specimen Description BLOOD LEFT ARM   Final   Special Requests BOTTLES DRAWN AEROBIC AND ANAEROBIC 10CC   Final   Culture  Setup Time     Final   Value: 03/29/2013 02:04     Performed at [facility]   Culture     Final   Value:        BLOOD CULTURE RECEIVED NO GROWTH TO DATE CULTURE WILL BE HELD FOR 5 DAYS BEFORE ISSUING A FINAL NEGATIVE REPORT     Performed at [facility]   Report Status PENDING   Incomplete  CULTURE, BLOOD (ROUTINE X 2)     Status: None   Collection Time    03/28/13 10:50 PM      Result Value Range Status   Specimen Description BLOOD LEFT HAND   Final   Special Requests BOTTLES DRAWN AEROBIC ONLY 10CC   Final   Culture  Setup Time     Final   Value: 03/29/2013 02:04     Performed at [facility]   Culture     Final   Value:        BLOOD CULTURE RECEIVED NO GROWTH TO DATE CULTURE WILL BE HELD FOR 5 DAYS BEFORE ISSUING A FINAL NEGATIVE REPORT     Performed at [facility]   Report Status PENDING   Incomplete    RADIOLOGY STUDIES/RESULTS: Dg Chest 2 View  03/27/2013   *RADIOLOGY REPORT*  Clinical Data: Shortness of breath  CHEST - 2 VIEW  Comparison: 03/12/13  Findings: The heart and pulmonary vascularity are within normal limits.  The lungs are clear bilaterally.  No acute bony abnormality is seen.   IMPRESSION: No acute abnormality noted.   Original Report Authenticated By: Alcide Clever, M.D.   Dg Chest 2 View  03/12/2013   *RADIOLOGY REPORT*  Clinical Data: Cough  CHEST - 2 VIEW  Comparison: 03/09/2011  Findings: Cardiomediastinal silhouette is stable.  No acute infiltrate or pleural effusion. Central mild bronchitic changes. No pulmonary edema.  Partially visualized postsurgical changes lower lumbar spine.  IMPRESSION: Central mild bronchitic changes. No acute infiltrate or pulmonary edema.   Original Report Authenticated By: Natasha Mead, M.D.    Jeoffrey Massed, MD  [facility] Pager:[phone]  If 7PM-7AM, please contact night-coverage [email or web address] Password [email or web address] 03/30/2013, 11:37 AM   LOS: 3 days

## 2013-03-30 NOTE — Progress Notes (Signed)
VASCULAR LAB PRELIMINARY  PRELIMINARY  PRELIMINARY  PRELIMINARY  Bilateral lower extremity venous duplex completed.    Preliminary report:  Bilateral:  No evidence of DVT, superficial thrombosis, or Baker's Cyst.   Mekhi Sonn, RVS 03/30/2013, 2:20 PM

## 2013-03-30 NOTE — Progress Notes (Signed)
Called to patient's room from family.  Pt had vomited contrast that was given for CT.  Informed CT department.  Will attempt to give more contrast and try to take pt down later.

## 2013-03-30 NOTE — Progress Notes (Signed)
Patient transferred to u/s. C/o nausea. Temp 100.8.

## 2013-03-31 ENCOUNTER — Encounter (HOSPITAL_COMMUNITY): Payer: Self-pay | Admitting: Certified Registered"

## 2013-03-31 ENCOUNTER — Encounter (HOSPITAL_COMMUNITY): Payer: Self-pay | Admitting: Anesthesiology

## 2013-03-31 ENCOUNTER — Inpatient Hospital Stay (HOSPITAL_COMMUNITY): Payer: Medicare Other | Admitting: Anesthesiology

## 2013-03-31 ENCOUNTER — Encounter (HOSPITAL_COMMUNITY)
Admission: EM | Disposition: A | Discharge status: Home Health | Payer: Self-pay | Source: Home / Self Care | Attending: Internal Medicine

## 2013-03-31 DIAGNOSIS — C8595 Non-Hodgkin lymphoma, unspecified, lymph nodes of inguinal region and lower limb: Secondary | ICD-10-CM

## 2013-03-31 DIAGNOSIS — R599 Enlarged lymph nodes, unspecified: Secondary | ICD-10-CM

## 2013-03-31 HISTORY — PX: INGUINAL LYMPH NODE BIOPSY: SHX5865

## 2013-03-31 LAB — CBC
MCH: 24.5 pg — ABNORMAL LOW (ref 26.0–34.0)
MCHC: 31.6 g/dL (ref 30.0–36.0)
MCV: 77.6 fL — ABNORMAL LOW (ref 78.0–100.0)
Platelets: 137 10*3/uL — ABNORMAL LOW (ref 150–400)
RBC: 3.88 MIL/uL (ref 3.87–5.11)
RDW: 17.5 % — ABNORMAL HIGH (ref 11.5–15.5)

## 2013-03-31 LAB — COMPREHENSIVE METABOLIC PANEL
ALT: 7 U/L (ref 0–35)
AST: 24 U/L (ref 0–37)
Albumin: 2 g/dL — ABNORMAL LOW (ref 3.5–5.2)
CO2: 26 mEq/L (ref 19–32)
Calcium: 9.8 mg/dL (ref 8.4–10.5)
Creatinine, Ser: 1.06 mg/dL (ref 0.50–1.10)
GFR calc non Af Amer: 52 mL/min — ABNORMAL LOW (ref >90)
Sodium: 134 mEq/L — ABNORMAL LOW (ref 135–145)
Total Protein: 5.3 g/dL — ABNORMAL LOW (ref 6.0–8.3)

## 2013-03-31 LAB — TYPE AND SCREEN
ABO/RH(D): B POS
Antibody Screen: NEGATIVE
Unit division: 0
Unit division: 0

## 2013-03-31 LAB — IRON AND TIBC
Iron: <10 ug/dL — ABNORMAL LOW (ref 42–135)
UIBC: 151 ug/dL (ref 125–400)

## 2013-03-31 SURGERY — BIOPSY, LYMPH NODE, INGUINAL, OPEN
Anesthesia: Monitor Anesthesia Care | Site: Groin | Laterality: Right | Wound class: Clean

## 2013-03-31 MED ORDER — FENTANYL CITRATE 0.05 MG/ML IJ SOLN
INTRAMUSCULAR | Status: DC | PRN
  Administered 2013-03-31: 50 ug via INTRAVENOUS

## 2013-03-31 MED ORDER — ONDANSETRON HCL 4 MG/2ML IJ SOLN
INTRAMUSCULAR | Status: DC | PRN
  Administered 2013-03-31: 4 mg via INTRAVENOUS

## 2013-03-31 MED ORDER — OMEPRAZOLE 40 MG PO CPDR: 40.0000 mg | DELAYED_RELEASE_CAPSULE | Freq: Every day | ORAL | Status: DC

## 2013-03-31 MED ORDER — PROPOFOL INFUSION 10 MG/ML OPTIME
INTRAVENOUS | Status: DC | PRN
  Administered 2013-03-31: 75 ug/kg/min via INTRAVENOUS

## 2013-03-31 MED ORDER — BISOPROLOL-HYDROCHLOROTHIAZIDE 10-6.25 MG PO TABS: 1.0000 | ORAL_TABLET | Freq: Every day | ORAL | Status: DC

## 2013-03-31 MED ORDER — FENTANYL CITRATE 0.05 MG/ML IJ SOLN: 25.0000 ug | INTRAMUSCULAR | Status: DC | PRN

## 2013-03-31 MED ORDER — OXYCODONE HCL 5 MG PO TABS: 5.0000 mg | ORAL_TABLET | Freq: Four times a day (QID) | ORAL | Status: DC | PRN

## 2013-03-31 MED ORDER — MIDAZOLAM HCL 5 MG/5ML IJ SOLN
INTRAMUSCULAR | Status: DC | PRN
  Administered 2013-03-31: 2 mg via INTRAVENOUS

## 2013-03-31 MED ORDER — ASPIRIN 81 MG PO TABS: 81.0000 mg | ORAL_TABLET | Freq: Every day | ORAL | Status: DC

## 2013-03-31 MED ORDER — PROMETHAZINE HCL 25 MG/ML IJ SOLN: 6.2500 mg | INTRAMUSCULAR | Status: DC | PRN

## 2013-03-31 MED ORDER — BISOPROLOL FUMARATE 10 MG PO TABS: 10.0000 mg | ORAL_TABLET | Freq: Every day | ORAL | Status: DC

## 2013-03-31 MED ORDER — LACTATED RINGERS IV SOLN
INTRAVENOUS | Status: DC
  Administered 2013-03-31: 12:00:00 via INTRAVENOUS

## 2013-03-31 MED ORDER — MIDAZOLAM HCL 2 MG/2ML IJ SOLN: 0.5000 mg | Freq: Once | INTRAMUSCULAR | Status: AC | PRN

## 2013-03-31 MED ORDER — 0.9 % SODIUM CHLORIDE (POUR BTL) OPTIME
TOPICAL | Status: DC | PRN
  Administered 2013-03-31: 1000 mL

## 2013-03-31 MED ORDER — POVIDONE-IODINE 10 % EX OINT
TOPICAL_OINTMENT | Freq: Every day | CUTANEOUS | Status: DC
  Administered 2013-03-31 – 2013-04-01 (×2): via TOPICAL
  Filled 2013-03-31: qty 28.35

## 2013-03-31 MED ORDER — LIDOCAINE HCL 1 % IJ SOLN
INTRAMUSCULAR | Status: DC | PRN
  Administered 2013-03-31: 13:00:00

## 2013-03-31 MED ORDER — CIPROFLOXACIN IN D5W 400 MG/200ML IV SOLN
400.0000 mg | INTRAVENOUS | Status: AC
  Administered 2013-03-31: 12:00:00 400 mg via INTRAVENOUS
  Filled 2013-03-31: qty 200

## 2013-03-31 MED ORDER — POVIDONE-IODINE 10 % EX OINT
TOPICAL_OINTMENT | CUTANEOUS | Status: DC | PRN
  Administered 2013-03-31: 1 via TOPICAL

## 2013-03-31 MED ORDER — MEPERIDINE HCL 25 MG/ML IJ SOLN: 6.2500 mg | INTRAMUSCULAR | Status: DC | PRN

## 2013-03-31 MED ORDER — KETOROLAC TROMETHAMINE 30 MG/ML IJ SOLN
15.0000 mg | Freq: Once | INTRAMUSCULAR | Status: AC | PRN
  Administered 2013-03-31: 15 mg via INTRAVENOUS
  Filled 2013-03-31: qty 1

## 2013-03-31 SURGICAL SUPPLY — 47 items
ADH SKN CLS APL DERMABOND .7 (GAUZE/BANDAGES/DRESSINGS) ×1
APPLIER CLIP 9.375 MED OPEN (MISCELLANEOUS) ×2
APR CLP MED 9.3 20 MLT OPN (MISCELLANEOUS) ×1
BLADE SURG 15 STRL LF DISP TIS (BLADE) ×1 IMPLANT
BLADE SURG 15 STRL SS (BLADE) ×2
BLADE SURG ROTATE 9660 (MISCELLANEOUS) IMPLANT
CANISTER SUCTION 2500CC (MISCELLANEOUS) IMPLANT
CHLORAPREP W/TINT 26ML (MISCELLANEOUS) ×2 IMPLANT
CLIP APPLIE 9.375 MED OPEN (MISCELLANEOUS) IMPLANT
CLOTH BEACON ORANGE TIMEOUT ST (SAFETY) ×2 IMPLANT
CONT SPEC 4OZ CLIKSEAL STRL BL (MISCELLANEOUS) ×2 IMPLANT
COVER SURGICAL LIGHT HANDLE (MISCELLANEOUS) ×2 IMPLANT
DERMABOND ADVANCED (GAUZE/BANDAGES/DRESSINGS) ×1
DERMABOND ADVANCED .7 DNX12 (GAUZE/BANDAGES/DRESSINGS) ×1 IMPLANT
DRAPE PED LAPAROTOMY (DRAPES) ×2 IMPLANT
DRAPE UTILITY 15X26 W/TAPE STR (DRAPE) ×4 IMPLANT
ELECT CAUTERY BLADE 6.4 (BLADE) ×2 IMPLANT
ELECT REM PT RETURN 9FT ADLT (ELECTROSURGICAL) ×2
ELECTRODE REM PT RTRN 9FT ADLT (ELECTROSURGICAL) ×1 IMPLANT
GAUZE SPONGE 4X4 16PLY XRAY LF (GAUZE/BANDAGES/DRESSINGS) ×2 IMPLANT
GLOVE BIO SURGEON STRL SZ8 (GLOVE) ×2 IMPLANT
GLOVE BIOGEL PI IND STRL 8 (GLOVE) ×1 IMPLANT
GLOVE BIOGEL PI INDICATOR 8 (GLOVE) ×1
GOWN PREVENTION PLUS XLARGE (GOWN DISPOSABLE) ×2 IMPLANT
GOWN STRL NON-REIN LRG LVL3 (GOWN DISPOSABLE) ×2 IMPLANT
KIT BASIN OR (CUSTOM PROCEDURE TRAY) ×2 IMPLANT
KIT ROOM TURNOVER OR (KITS) ×2 IMPLANT
NDL HYPO 25GX1X1/2 BEV (NEEDLE) ×1 IMPLANT
NEEDLE 22X1 1/2 (OR ONLY) (NEEDLE) ×1 IMPLANT
NEEDLE HYPO 25GX1X1/2 BEV (NEEDLE) ×2 IMPLANT
NS IRRIG 1000ML POUR BTL (IV SOLUTION) ×2 IMPLANT
PACK SURGICAL SETUP 50X90 (CUSTOM PROCEDURE TRAY) ×2 IMPLANT
PAD ARMBOARD 7.5X6 YLW CONV (MISCELLANEOUS) ×2 IMPLANT
PENCIL BUTTON HOLSTER BLD 10FT (ELECTRODE) ×2 IMPLANT
SPONGE GAUZE 4X4 12PLY (GAUZE/BANDAGES/DRESSINGS) ×1 IMPLANT
STAPLER VISISTAT 35W (STAPLE) ×1 IMPLANT
SUT MNCRL AB 4-0 PS2 18 (SUTURE) ×2 IMPLANT
SUT VIC AB 3-0 SH 27 (SUTURE) ×2
SUT VIC AB 3-0 SH 27X BRD (SUTURE) ×1 IMPLANT
SUT VICRYL AB 3 0 TIES (SUTURE) ×1 IMPLANT
SYR BULB 3OZ (MISCELLANEOUS) ×2 IMPLANT
SYR CONTROL 10ML LL (SYRINGE) ×2 IMPLANT
TAPE CLOTH SURG 4X10 WHT LF (GAUZE/BANDAGES/DRESSINGS) ×1 IMPLANT
TOWEL OR 17X24 6PK STRL BLUE (TOWEL DISPOSABLE) ×2 IMPLANT
TOWEL OR 17X26 10 PK STRL BLUE (TOWEL DISPOSABLE) ×2 IMPLANT
TUBE CONNECTING 12X1/4 (SUCTIONS) IMPLANT
YANKAUER SUCT BULB TIP NO VENT (SUCTIONS) IMPLANT

## 2013-03-31 NOTE — Progress Notes (Signed)
Daughter of patient wants to relay to physician that patient's mother had 'hairy cell lymphona".

## 2013-03-31 NOTE — Progress Notes (Signed)
Notified Craige Cotta, NP that patient's temp is 101.3 via text page. Giving tylenol as ordered. Will continue to monitor patient. Nelda Marseille, RN

## 2013-03-31 NOTE — Anesthesia Procedure Notes (Signed)
Procedure Name: MAC Date/Time: 03/31/2013 12:03 PM Performed by: Sheppard Evens Pre-anesthesia Checklist: Patient identified, Emergency Drugs available, Suction available, Patient being monitored and Timeout performed Patient Re-evaluated:Patient Re-evaluated prior to inductionOxygen Delivery Method: Simple face mask Intubation Type: IV induction

## 2013-03-31 NOTE — Progress Notes (Addendum)
PATIENT DETAILS Name: Tonya Wise Age: 72 y.o. Sex: female Date of Birth: [birth year] Admit Date: 03/27/2013 Admitting Physician Jeralyn Bennett, MD ZOX:[id number] Leonette Most, MD  Brief summary Patient is a 72 year old Caucasian female with a past medical history of hypertension, mild chronic kidney disease, prior history of diarrhea with workup including EGD/colonoscopy negative, was admitted for weakness and malaise. Found to have a hemoglobin of 6.4 by her PCP and referred to the hospitalist service for further evaluation and treatment. This was new-onset anemia, and was microcytic, initial suspicion was for a recent upper GI bleed, although FOBT was negative. She was also found to have mild hypercalcemia, and renal failure. GI was consulted, EGD was done which did not show any foci of bleeding. Colonoscopy was not done as it was recently done as an outpatient within the past year. Since she continued to have unexplained anemia, further workup was initiated, which essentially revealed a elevated LDH level, a CT scan of the chest and abdomen was done which showed significant lymphadenopathy. Patient during this hospital course, was found to have fever with some night sweats. 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 [facility] has been consulted for right inguinal lymph node excisional biopsy, oncology has also been consulted.  Subjective: No major issues overnight, continues to have low-grade fever.   Assessment/Plan: Active Problems: Anemia  - Patient was admitted and transfused a total of 3 units of PRBC, hemoglobin remained stable post transfusion -suspected initially to be from recent blood loss, although a FOBT was neg on admission. EGD done on 8/31 did not show any foci for bleeding. She recently had a colonoscopy within the past year, that was essentially negative. At this time GI does not recommend any further endoscopic evaluation. Since, anemia  continues to be unexplained, further workup was initiated.  LDH significantly elevated at 380, haptoglobin also negative. Coombs test negative. SPEP/UPEP pending. 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 - Have consulted [facility] today, for right inguinal excisional lymph node biopsy. - Hematology/oncology on board - Small bowel biopsies during EGD was done-currently pending  Intra-abdominal/thoracic lymphadenopathy - Highly suspicious for lymphoma with B. symptoms-have consulted [facility] for right inguinal excisional lymph node biopsy - Suspect fever, lymphadenopathy and mild hypercalcemia all secondary to lymphoma.  Fever - Blood cultures negative so far. UA negative for UTI. Chest x-ray negative for pneumonia. CT scan of the chest and abdomen negative for any foci of infection. Lower extremity Dopplers also negative for DVT. - Suspect fever and night sweats are secondary to lymphoma with B. Symptoms -Briefly was on IV Rocephin, but this was discontinued, and we are just watching off Abx  Acute on chronic renal failure stage II- III -likely secondary to prerenal azotemia in setting of volume depletion from night sweats, fever and diuretic therapy -better with IVF - As noted above, SPEP/UPEP pending  Gastroesophageal reflux disease  -On IV PPI therapy-change to oral PPI on discharge  Mild Hypercalcemia -better with IVF -monitor -given mild renal failure, anemia-SPEP/UPEP pending, however suspect this is all secondary to hypercalcemia-suspect 1, 25 hydroxy vitamin D level to be elevated. Will order workup-PTH, PTH related peptide and a vitamin D panel. Please follow. -when corrected to albumin-Ca is still high  HTN - Controlled with bisoprolol  Moderate protein calorie malnutrition - Secondary to chronic disease  Disposition: Remain inpatient- home the next 1-2  days  DVT Prophylaxis:  SCD's  Code Status: Full code   Family Communication 2 daughters at bedside and husband  Procedures:  EGD-8/31  CONSULTS:  GI Hematology/oncology [facility]  MEDICATIONS: Scheduled Meds: . bisoprolol  10 mg Oral Daily  . pantoprazole (PROTONIX) IV  40 mg Intravenous Q12H  . sodium chloride  3 mL Intravenous Q12H   Continuous Infusions: . sodium chloride 75 mL/hr at 03/31/13 0200   PRN Meds:.acetaminophen, acetaminophen, ondansetron (ZOFRAN) IV, ondansetron, oxyCODONE  Antibiotics: Anti-infectives   Start     Dose/Rate Route Frequency Ordered Stop   03/29/13 0000  cefTRIAXone (ROCEPHIN) 1 g in dextrose 5 % 50 mL IVPB  Status:  Discontinued     1 g 100 mL/hr over 30 Minutes Intravenous Every 24 hours 03/28/13 2251 03/30/13 1033   03/28/13 2245  metroNIDAZOLE (FLAGYL) IVPB 500 mg  Status:  Discontinued     500 mg 100 mL/hr over 60 Minutes Intravenous 3 times per day 03/28/13 2228 03/29/13 1205       PHYSICAL EXAM: Vital signs in last 24 hours: Filed Vitals:   03/30/13 1604 03/30/13 1731 03/30/13 2058 03/31/13 0521  BP: 134/85  123/73 129/72  Pulse: 109  95 96  Temp: 103 F (39.4 C) 98.6 F (37 C)  100.1 F (37.8 C)  TempSrc: Oral Oral Oral Oral  Resp: 18  20 20   Height:      Weight:    73 kg (160 lb 15 oz)  SpO2: 94%  94% 91%    Weight change:  Filed Weights   03/28/13 0511 03/29/13 0447 03/31/13 0521  Weight: 72.6 kg (160 lb 0.9 oz) 71 kg (156 lb 8.4 oz) 73 kg (160 lb 15 oz)   Body mass index is 29.43 kg/(m^2).   Gen Exam: Awake and alert with clear speech.   Neck: Supple, No JVD.   Chest: B/L Clear.   CVS: S1 S2 Regular Abdomen: soft, BS +, non tender, non distended.  Extremities: no edema, lower extremities warm to touch Neurologic: Non Focal.   Skin: No Rash.   Wounds: N/A.    Intake/Output from previous day:  Intake/Output Summary (Last 24 hours) at 03/31/13 0855 Last data filed at 03/31/13 0507  Gross per 24 hour   Intake 1736.75 ml  Output    350 ml  Net 1386.75 ml     LAB RESULTS: CBC  Recent Labs Lab 03/26/13 1338 03/27/13 1206 03/28/13 0400 03/29/13 0459 03/30/13 0540 03/31/13 0600  WBC 5.5 6.9 4.8 4.6 4.1 3.6*  HGB 7.9* 6.4* 10.3* 10.6* 9.9* 9.5*  HCT 26.2* 21.3* 31.4* 32.5* 31.4* 30.1*  PLT 237 240 163 164 143* 137*  MCV 72.8* 75.0* 76.2* 77.2* 78.1 77.6*  MCH 21.9* 22.5* 25.0* 25.2* 24.6* 24.5*  MCHC 30.2 30.0 32.8 32.6 31.5 31.6  RDW 17.6* 17.3* 16.4* 17.2* 17.4* 17.5*  LYMPHSABS 0.8 0.9  --   --   --   --   MONOABS 0.8 1.1*  --   --   --   --   EOSABS 0.0 0.0  --   --   --   --   BASOSABS 0.0 0.0  --   --   --   --     Chemistries   Recent Labs Lab 03/27/13 1206 03/28/13 0400 03/29/13 0459 03/30/13 0540 03/31/13 0600  NA 133* 137 136 135 134*  K 4.2 3.7 3.7 3.7 3.7  CL 92* 97 97 98 99  CO2 26 26 26 24 26   GLUCOSE 233* 125* 123*  118* 109*  BUN 17 14 13 12 10   CREATININE 1.60* 1.42* 1.21* 1.11* 1.06  CALCIUM 11.2* 10.1 10.3 9.9 9.8    CBG: No results found for this basename: GLUCAP,  in the last 168 hours  GFR Estimated Creatinine Clearance: 45.6 ml/min (by C-G formula based on Cr of 1.06).  Coagulation profile  Recent Labs Lab 03/27/13 1206  INR 1.33    Cardiac Enzymes  Recent Labs Lab 03/27/13 1206  TROPONINI <0.30    No components found with this basename: POCBNP,  No results found for this basename: DDIMER,  in the last 72 hours No results found for this basename: HGBA1C,  in the last 72 hours No results found for this basename: CHOL, HDL, LDLCALC, TRIG, CHOLHDL, LDLDIRECT,  in the last 72 hours No results found for this basename: TSH, T4TOTAL, FREET3, T3FREE, THYROIDAB,  in the last 72 hours No results found for this basename: VITAMINB12, FOLATE, FERRITIN, TIBC, IRON, RETICCTPCT,  in the last 72 hours No results found for this basename: LIPASE, AMYLASE,  in the last 72 hours  Urine Studies No results found for this basename: UACOL,  UAPR, USPG, UPH, UTP, UGL, UKET, UBIL, UHGB, UNIT, UROB, ULEU, UEPI, UWBC, URBC, UBAC, CAST, CRYS, UCOM, BILUA,  in the last 72 hours  MICROBIOLOGY: Recent Results (from the past 240 hour(s))  CULTURE, BLOOD (ROUTINE X 2)     Status: None   Collection Time    03/28/13 10:40 PM      Result Value Range Status   Specimen Description BLOOD LEFT ARM   Final   Special Requests BOTTLES DRAWN AEROBIC AND ANAEROBIC 10CC   Final   Culture  Setup Time     Final   Value: 03/29/2013 02:04     Performed at [facility]   Culture     Final   Value:        BLOOD CULTURE RECEIVED NO GROWTH TO DATE CULTURE WILL BE HELD FOR 5 DAYS BEFORE ISSUING A FINAL NEGATIVE REPORT     Performed at [facility]   Report Status PENDING   Incomplete  CULTURE, BLOOD (ROUTINE X 2)     Status: None   Collection Time    03/28/13 10:50 PM      Result Value Range Status   Specimen Description BLOOD LEFT HAND   Final   Special Requests BOTTLES DRAWN AEROBIC ONLY 10CC   Final   Culture  Setup Time     Final   Value: 03/29/2013 02:04     Performed at [facility]   Culture     Final   Value:        BLOOD CULTURE RECEIVED NO GROWTH TO DATE CULTURE WILL BE HELD FOR 5 DAYS BEFORE ISSUING A FINAL NEGATIVE REPORT     Performed at [facility]   Report Status PENDING   Incomplete    RADIOLOGY STUDIES/RESULTS: Dg Chest 2 View  03/27/2013   *RADIOLOGY REPORT*  Clinical Data: Shortness of breath  CHEST - 2 VIEW  Comparison: 03/12/13  Findings: The heart and pulmonary vascularity are within normal limits.  The lungs are clear bilaterally.  No acute bony abnormality is seen.  IMPRESSION: No acute abnormality noted.   Original Report Authenticated By: Alcide Clever, M.D.   Dg Chest 2 View  03/12/2013   *RADIOLOGY REPORT*  Clinical Data: Cough  CHEST - 2 VIEW  Comparison: 03/09/2011  Findings: Cardiomediastinal silhouette is stable.  No acute infiltrate or  pleural effusion. Central mild bronchitic changes.  No pulmonary edema.  Partially visualized postsurgical changes lower lumbar spine.  IMPRESSION: Central mild bronchitic changes. No acute infiltrate or pulmonary edema.   Original Report Authenticated By: Natasha Mead, M.D.    Jeoffrey Massed, MD  [facility] Pager:[phone]  If 7PM-7AM, please contact night-coverage [email or web address] Password [email or web address] 03/31/2013, 8:55 AM   LOS: 4 days

## 2013-03-31 NOTE — Anesthesia Postprocedure Evaluation (Signed)
Anesthesia Post Note  Patient: Tonya Wise  Procedure(s) Performed: Procedure(s) (LRB): INGUINAL LYMPH NODE BIOPSY (Right)  Anesthesia type: mac   Patient location: PACU  Post pain: Pain level controlled  Post assessment: Post-op Vital signs reviewed  Last Vitals:  Filed Vitals:   03/31/13 1315  BP:   Pulse: 89  Temp:   Resp: 21    Post vital signs: Reviewed  Level of consciousness: sedated  Complications: No apparent anesthesia complications

## 2013-03-31 NOTE — Transfer of Care (Signed)
Immediate Anesthesia Transfer of Care Note  Patient: Tonya Wise  Procedure(s) Performed: Procedure(s): INGUINAL LYMPH NODE BIOPSY (Right)  Patient Location: PACU  Anesthesia Type:MAC  Level of Consciousness: awake, alert  and oriented  Airway & Oxygen Therapy: Patient Spontanous Breathing and Patient connected to nasal cannula oxygen  Post-op Assessment: Report given to PACU RN and Post -op Vital signs reviewed and stable  Post vital signs: Reviewed and stable  Complications: No apparent anesthesia complications

## 2013-03-31 NOTE — Progress Notes (Signed)
Restful night for patient. No c/o nausea or feeling feverish. Temp at 0500 100.1. Will continue to assess.

## 2013-03-31 NOTE — Discharge Summary (Signed)
PATIENT DETAILS Name: Tonya Wise Age: 72 y.o. Sex: female Date of Birth: [birth year] MRN: [id number]. Admit Date: 03/27/2013 Admitting Physician: Jeralyn Bennett, MD WUJ:WJXBJYN,WGNF CHARLES, MD  Recommendations for Outpatient Follow-up:  1. Inguinal Lymph node biopsy done on 03/31/13 results pending-please follow 2. SPEP/UPEP pending-please follow 3. PTH/PTH related peptide pending-please follow 4. 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 Hypertension   . Renal insufficiency   . Osteopenia   . Allergy   . Heart murmur   . GERD (gastroesophageal reflux disease)   . Anemia   . Hypercalcemia 03/27/2013    DISCHARGE MEDICATIONS:   Medication List    STOP taking these medications       amLODipine 5 MG tablet  Commonly known as:  NORVASC     bisoprolol-hydrochlorothiazide 10-6.25 MG per tablet  Commonly known as:  ZIAC     CALCIUM + D PO     nystatin 100000 UNIT/ML suspension  Commonly known as:  MYCOSTATIN     ondansetron 8 MG tablet  Commonly known as:  ZOFRAN     OSTEO BI-FLEX ADV TRIPLE ST PO      TAKE these medications       aspirin 81 MG tablet  Take 1 tablet (81 mg total) by mouth daily.     bisoprolol 10 MG tablet  Commonly known as:  ZEBETA  Take 1 tablet (10 mg total) by mouth daily.     omeprazole 40 MG capsule  Commonly known as:  PRILOSEC  Take 1 capsule (40 mg total) by mouth daily.     oxyCODONE 5 MG immediate release tablet  Commonly known as:  Oxy IR/ROXICODONE  Take 1 tablet (5 mg total) by mouth every 6 (six) hours as needed.      Tylenol 650 mg PO q4H prn pain or fever  ALLERGIES:   Allergies  Allergen Reactions  . Penicillins Rash    BRIEF HPI:  See H&P, Labs, Consult  and Test reports for all details in brief,Patient is a 72 year old Caucasian female with a past medical history of hypertension, mild chronic kidney disease, prior history of diarrhea with workup including EGD/colonoscopy negative, was admitted for weakness and malaise. Found to have a hemoglobin of 6.4 by her PCP and referred to the hospitalist service for further evaluation   CONSULTATIONS:   hematology/oncology and general surgery  GI   PERTINENT RADIOLOGIC STUDIES: Dg Chest 2 View  03/27/2013   *RADIOLOGY REPORT*  Clinical Data: Shortness of breath  CHEST - 2 VIEW  Comparison: 03/12/13  Findings: The heart and pulmonary vascularity are within normal limits.  The lungs are clear bilaterally.  No acute bony abnormality is seen.  IMPRESSION: No acute abnormality noted.   Original Report Authenticated By: Alcide Clever, M.D.   Dg Chest 2 View  03/12/2013   *RADIOLOGY REPORT*  Clinical Data: Cough  CHEST - 2 VIEW  Comparison: 03/09/2011  Findings: Cardiomediastinal silhouette is stable.  No acute infiltrate or pleural effusion. Central mild bronchitic changes. No pulmonary edema.  Partially visualized postsurgical changes lower lumbar spine.  IMPRESSION: Central mild bronchitic changes. No acute infiltrate or pulmonary edema.   Original Report Authenticated By: Natasha Mead, M.D.   Ct Chest  W Contrast  03/30/2013   CLINICAL DATA:  72 year old female with fever of unknown origin, anemia, blood loss of unknown etiology, possible malignancy, renal insufficiency, hypertension.  EXAM: CT CHEST, ABDOMEN, AND PELVIS WITH CONTRAST  TECHNIQUE: Multidetector CT imaging of the chest, abdomen and pelvis was performed following the standard protocol during bolus administration of intravenous contrast.  CONTRAST:  OMNIPAQUE IOHEXOL 300 MG/ML  SOLN  COMPARISON:  Chest radiographs 03/28/2013 and earlier. Abdominal radiographs 11/25/2004.  FINDINGS: CT CHEST FINDINGS  Major airways are patent. No pericardial or pleural  effusion. In the posterior right upper lobe there is mild reticular nodular Peribronchovascular opacity which appears to be inflammatory (series 3, image 17). Otherwise, the lungs are clear.  No acute or suspicious osseous abnormality identified in the chest.  Incidental breast implants.  Negative visualized thoracic inlet. However, there is hilar and mediastinal lymphadenopathy. Right peritracheal node measures up to 15 mm short axis. Hilar nodes measure up to 20 mm individually right greater than left. Bulky subcarinal lymphadenopathy, measuring up to 22 mm. There is also a right greater than left Mild axillary lymphadenopathy, with individual nodes measuring up tip 20 mm short axis. No breast soft tissue mass identified.  Major mediastinal vascular structures remain patent.  CT ABDOMEN AND PELVIS FINDINGS  Splenomegaly with permeative/miliary hypodensity scattered in the splenic parenchyma. Bulky right greater than left retroperitoneal lymphadenopathy, with AE right aortocaval nodal conglomeration measuring up to 36 mm short axis. Less pronounced splenic hilum, gastrohepatic ligament, and porta hepatis lymphadenopathy. Retroperitoneal lymphadenopathy continues into the pelvis. Left iliac bifurcation node measures 23 mm short axis. Right inguinal rounded lymph node measures 24 mm short axis. Smaller round inguinal lymph nodes on the left. Abdominal mesenteric nodes also appear enlarged measuring up to 12 mm short axis.  Trace pelvic free fluid. No abdominal free fluid. Oral contrast has reached the rectum. Uterus and adnexal within normal limits. Unremarkable bladder. Redundant left colon. Negative right colon and appendix. No dilated small bowel. Decompressed stomach. Negative pancreas, adrenal glands, kidneys, and gallbladder. Low density throughout the liver suggesting steatosis. Major arterial structures in the abdomen and pelvis are patent. Portal venous system within normal limits.  Sequelae of L3-L4 and  L4-L5 fusion. 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 No acute or suspicious osseous lesion in the abdomen or pelvis.  IMPRESSION: CT CHEST IMPRESSION  1. Mediastinal, hilar, and axillary lymphadenopathy, in conjunction with the abdomen and pelvis findings, most compatible with non-Hodgkin's lymphoma.  2. Trace abnormal lung opacity in the posterior right upper lobe may reflect mild acute infection or chronic postinflammatory change.  CT ABDOMEN AND PELVIS IMPRESSION  1. 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  2. Abdominal and pelvic lymphadenopathy maximal in the retroperitoneum and right inguinal nodal stations.  3. 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  4. Hepatic steatosis.   Electronically Signed   By: Augusto Gamble   On: 03/30/2013 20:31   US Abdomen Complete  03/30/2013   *RADIOLOGY REPORT*  Clinical Data:  Elevated liver function tests  COMPLETE ABDOMINAL ULTRASOUND  Comparison:  None.  Findings:  Gallbladder:  Well distended with evidence of gallbladder sludge. No definitive stones are seen.  Wall thickening is noted.  Common bile duct:  5.1 mm.  This is within normal limits.  Liver:  A focal area of increased echogenicity is identified adjacent to the gallbladder fossa likely related to focal fatty infiltration or a small hemangioma.  IVC:  Appears normal.  Pancreas:  No focal abnormality seen.  Spleen:  13.4 cm.  Diffusely heterogeneous  and mildly enlarged.  Right Kidney:  10.7 cm.  No mass lesion or hydronephrosis is noted.  Left Kidney:  11 cm without mass lesion or hydronephrosis.  Abdominal aorta:  No aneurysm identified.  Adjacent to the aorta near the origin of the superior mesenteric artery there is a 4.5 by 2.5 cm hypoechoic  area which may represent a lymph node.  IMPRESSION: Gallbladder sludge.  Hyper echoic lesion within the liver of uncertain significance.  Splenomegaly with diffuse heterogeneity.  Hypoechoic area adjacent to the proximal aorta of uncertain significance.  CT of the abdomen and pelvis with contrast may be helpful for further evaluation.   Original Report Authenticated By: Alcide Clever, M.D.   Ct Abdomen Pelvis W Contrast  03/30/2013   CLINICAL DATA:  72 year old female with fever of unknown origin, anemia, blood loss of unknown etiology, possible malignancy, renal insufficiency, hypertension.  EXAM: CT CHEST, ABDOMEN, AND PELVIS WITH CONTRAST  TECHNIQUE: Multidetector CT imaging of the chest, abdomen and pelvis was performed following the standard protocol during bolus administration of intravenous contrast.  CONTRAST:  OMNIPAQUE IOHEXOL 300 MG/ML  SOLN  COMPARISON:  Chest radiographs 03/28/2013 and earlier. Abdominal radiographs 11/25/2004.  FINDINGS: CT CHEST FINDINGS  Major airways are patent. No pericardial or pleural effusion. In the posterior right upper lobe there is mild reticular nodular Peribronchovascular opacity which appears to be inflammatory (series 3, image 17). Otherwise, the lungs are clear.  No acute or suspicious osseous abnormality identified in the chest.  Incidental breast implants.  Negative visualized thoracic inlet. However, there is hilar and mediastinal lymphadenopathy. Right peritracheal node measures up to 15 mm short axis. Hilar nodes measure up to 20 mm individually right greater than left. Bulky subcarinal lymphadenopathy, measuring up to 22 mm. There is also a right greater than left Mild axillary lymphadenopathy, with individual nodes measuring up tip 20 mm short axis. No breast soft tissue mass identified.  Major mediastinal vascular structures remain patent.  CT ABDOMEN AND PELVIS FINDINGS  Splenomegaly with permeative/miliary hypodensity scattered in the splenic parenchyma.  Bulky right greater than left retroperitoneal lymphadenopathy, with AE right aortocaval nodal conglomeration measuring up to 36 mm short axis. Less pronounced splenic hilum, gastrohepatic ligament, and porta hepatis lymphadenopathy. Retroperitoneal lymphadenopathy continues into the pelvis. Left iliac bifurcation node measures 23 mm short axis. Right inguinal rounded lymph node measures 24 mm short axis. Smaller round inguinal lymph nodes on the left. Abdominal mesenteric nodes also appear enlarged measuring up to 12 mm short axis.  Trace pelvic free fluid. No abdominal free fluid. Oral contrast has reached the rectum. Uterus and adnexal within normal limits. Unremarkable bladder. Redundant left colon. Negative right colon and appendix. No dilated small bowel. Decompressed stomach. Negative pancreas, adrenal glands, kidneys, and gallbladder. Low density throughout the liver suggesting steatosis. Major arterial structures in the abdomen and pelvis are patent. Portal venous system within normal limits.  Sequelae of L3-L4 and L4-L5 fusion. 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 No acute or suspicious osseous lesion in the abdomen or pelvis.  IMPRESSION: CT CHEST IMPRESSION  1. Mediastinal, hilar, and axillary lymphadenopathy, in conjunction with the abdomen and  pelvis findings, most compatible with non-Hodgkin's lymphoma.  2. Trace abnormal lung opacity in the posterior right upper lobe may reflect mild acute infection or chronic postinflammatory change.  CT ABDOMEN AND PELVIS IMPRESSION  1. 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  2. Abdominal and pelvic lymphadenopathy maximal in the retroperitoneum and right inguinal nodal stations.  3. Previous L3 to L5 level spinal fusion with advanced adjacent segment disease at L2-L3 and L5-S1, including spondylolisthesis and multifactorial  spinal stenosis at both levels.  4. Hepatic steatosis.   Electronically Signed   By: Augusto Gamble   On: 03/30/2013 20:31   Dg Chest Port 1 View  03/28/2013   CLINICAL DATA:  Fever  EXAM: PORTABLE CHEST - 1 VIEW  COMPARISON:  03/27/2013  FINDINGS: Decreasing lung volumes without focal airspace opacity. Heart is normal size. No effusions. No acute bony abnormality.  IMPRESSION: No active disease.   Electronically Signed   By: Charlett Nose   On: 03/28/2013 22:21     PERTINENT LAB RESULTS: CBC:  Recent Labs  03/30/13 0540 03/31/13 0600  WBC 4.1 3.6*  HGB 9.9* 9.5*  HCT 31.4* 30.1*  PLT 143* 137*   CMET CMP     Component Value Date/Time   NA 134* 03/31/2013 0600   K 3.7 03/31/2013 0600   CL 99 03/31/2013 0600   CO2 26 03/31/2013 0600   GLUCOSE 109* 03/31/2013 0600   BUN 10 03/31/2013 0600   CREATININE 1.06 03/31/2013 0600   CREATININE 1.37* 03/26/2013 1338   CALCIUM 9.8 03/31/2013 0600   PROT 5.3* 03/31/2013 0600   ALBUMIN 2.0* 03/31/2013 0600   AST 24 03/31/2013 0600   ALT 7 03/31/2013 0600   ALKPHOS 120* 03/31/2013 0600   BILITOT 0.6 03/31/2013 0600   GFRNONAA 52* 03/31/2013 0600   GFRAA 60* 03/31/2013 0600    Recent Labs  03/31/13 0600  FERRITIN 3608*  TIBC Not calculated due to Iron <10.  IRON <10*   Coags: No results found for this basename: PT, INR,  in the last 72 hours Microbiology: Recent Results (from the past 240 hour(s))  CULTURE, BLOOD (ROUTINE X 2)     Status: None   Collection Time    03/28/13 10:40 PM      Result Value Range Status   Specimen Description BLOOD LEFT ARM   Final   Special Requests BOTTLES DRAWN AEROBIC AND ANAEROBIC 10CC   Final   Culture  Setup Time     Final   Value: 03/29/2013 02:04     Performed at [facility]   Culture     Final   Value:        BLOOD CULTURE RECEIVED NO GROWTH TO DATE CULTURE WILL BE HELD FOR 5 DAYS BEFORE ISSUING A FINAL NEGATIVE REPORT     Performed at [facility]   Report Status PENDING   Incomplete  CULTURE, BLOOD  (ROUTINE X 2)     Status: None   Collection Time    03/28/13 10:50 PM      Result Value Range Status   Specimen Description BLOOD LEFT HAND   Final   Special Requests BOTTLES DRAWN AEROBIC ONLY 10CC   Final   Culture  Setup Time     Final   Value: 03/29/2013 02:04     Performed at [facility]   Culture     Final   Value:        BLOOD CULTURE RECEIVED NO GROWTH TO  DATE CULTURE WILL BE HELD FOR 5 DAYS BEFORE ISSUING A FINAL NEGATIVE REPORT     Performed at [facility]   Report Status PENDING   Incomplete   Small Intestine Biopsy - BENIGN SMALL BOWEL MUCOSA. - NO VILLOUS ATROPHY, INFLAMMATION OR OTHER ABNORMALITIES IDENTIFIED.  25 hydroxy vitamin D level 50  BRIEF HOSPITAL COURSE:  Anemia  - Patient was admitted and transfused a total of 3 units of PRBC, hemoglobin remained stable post transfusion  -suspected initially to be from recent blood loss, although a FOBT was neg on admission. EGD done on 8/31 did not show any foci for bleeding. She recently had a colonoscopy within the past year, that was essentially negative. At this time GI does not recommend any further endoscopic evaluation. Since, anemia continued to be unexplained, further workup was initiated. LDH significantly elevated at 380, haptoglobin levels were high as well.. Coombs test negative. SPEP/UPEP pending. 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  - Consulted [facility] for right inguinal excisional lymph node biopsy. This was done on 03/31/13-results are still pending at the time of discharge. Please follow at follow up - Hematology/oncology was also consulted during her stay here, a follow up appointment with Oncology has been made for 9/8 - Small bowel biopsies negative  Intra-abdominal/thoracic lymphadenopathy  - Highly suspicious for lymphoma with B. symptoms- [facility] was consulted for  right inguinal excisional lymph node biopsy, this was done on 9/2-results still pending at the time of discharge - Suspect fever, lymphadenopathy and mild hypercalcemia all secondary to lymphoma.   Fever  - Blood cultures negative so far. UA negative for UTI. Chest x-ray negative for pneumonia. CT scan of the chest and abdomen negative for any foci of infection. Lower extremity Dopplers also negative for DVT.  - Suspect fever and night sweats are secondary to lymphoma with B. 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 Will order workup-PTH, PTH related peptide and a vitamin D panel. Please follow at next visit with PCP or at Oncology -when corrected to albumin-Ca is still high  -hold all Vit D and Calcium supplementation on discharge  HTN  - Controlled with bisoprolol alone, will resume discharge. Will stop HCTZ given hypercalcemia. Amlodipine can be added prn in the outpatient setting if needed  Moderate protein calorie malnutrition  - Secondary to chronic disease    TODAY-DAY OF DISCHARGE:  Subjective:   Tyreanna Bisesi today has no headache,no chest abdominal pain,no new weakness tingling or numbness, feels much better wants to go home today.   Objective:   Blood pressure 107/58, pulse 88, temperature 98.6 F (37 C), temperature source Oral, resp. rate 16, height 5'  2" (1.575 m), weight 73 kg (160 lb 15 oz), SpO2 94.00%.  Intake/Output Summary (Last 24 hours) at 03/31/13 1449 Last  data filed at 03/31/13 1245  Gross per 24 hour  Intake 1933.75 ml  Output      0 ml  Net 1933.75 ml   Filed Weights   03/28/13 0511 03/29/13 0447 03/31/13 0521  Weight: 72.6 kg (160 lb 0.9 oz) 71 kg (156 lb 8.4 oz) 73 kg (160 lb 15 oz)    Exam Awake Alert, Oriented *3, No new F.N deficits, Normal affect Tensed.AT,PERRAL Supple Neck,No JVD, No cervical lymphadenopathy appriciated.  Symmetrical Chest wall movement, Good air movement bilaterally, CTAB RRR,No Gallops,Rubs or new Murmurs, No Parasternal Heave +ve B.Sounds, Abd Soft, Non tender, No organomegaly appriciated, No rebound -guarding or rigidity. No Cyanosis, Clubbing or edema, No new Rash or bruise  DISCHARGE CONDITION: Stable  DISPOSITION: Home  DISCHARGE INSTRUCTIONS:    Activity:  As tolerated   Diet recommendation: Diabetic Diet  Discharge Orders   Future Appointments Provider Department Dept Phone   04/06/2013 12:00 PM [facility] Financial Counselor [facility] [phone]   04/06/2013 12:30 PM [facility] Covering Provider [facility] [phone]   Future Orders Complete By Expires   Call MD for:  persistant nausea and vomiting  As directed    Call MD for:  severe uncontrolled pain  As directed    Diet - low sodium heart healthy  As directed    Increase activity slowly  As directed       Follow-up Information   Follow up with Myra Rude, MD On 04/06/2013. (1pm at [facility])    Specialty:  Hematology and Oncology   Contact information:   [address] [place] [address] [phone]       Follow up with [name], MD. Schedule an appointment as soon as possible for a visit in 1 week.   Specialty:  Family Medicine   Contact information:   [address] [place]  [address] [phone]      Total Time spent on discharge equals 45 minutes.  SignedJeoffrey Massed 03/31/2013 2:49 PM

## 2013-03-31 NOTE — Op Note (Signed)
OPERATIVE REPORT  DATE OF OPERATION: 03/27/2013 - 03/31/2013  PATIENT:  Tonya Wise  72 y.o. female  PRE-OPERATIVE DIAGNOSIS:  Right inguinal adenopathy  POST-OPERATIVE DIAGNOSIS:  Right inguinal adenopathy  PROCEDURE:  Procedure(s): INGUINAL LYMPH NODE BIOPSY, Right side  SURGEON:  Surgeon(s): Cherylynn Ridges, MD  ASSISTANTSalomon Fick, NP  ANESTHESIA:   local and MAC  EBL: <20 ml  BLOOD ADMINISTERED: none  DRAINS: none   SPECIMEN:  Source of Specimen:  Right inguinal lymph nodes  COUNTS CORRECT:  YES  PROCEDURE DETAILS: The patient was taken to the operating room and placed on the table in the supine position. After an adequate amount of IV sedation was given she was prepped and draped in usual sterile manner exposing the right groin area.  The patient had been marked preoperatively, a proper time out was performed identifying the patient and the procedure to be performed.  We anesthetized the area of incision after marking it with a marking pen. We used a split solution of .25% Marcaine with epinephrine a 1% Xylocaine. A total of 10 cc used to make the initial infiltration. Once this was done a transverse incision approximately 5 cm long was made using a #15 blade and taken down to subcutaneous tissue with electrocautery. This was directly on to of the enlarged lymph node.  Using the automatic clip applier with medium-size clips 3-0 Vicryl ties and electrocautery hemostasis was obtained. We dissected out the very large 3-4 cm right inguinal lymph node along with satellite lymph nodes and sent off as a specimen fresh. Hemostasis was obtained with electrocautery and also hemoclips. We then closed the wound in 2 layers. 3-0 Vicryl interrupted simple stitch of the subcutaneous tissue then the skin was closed using stainless steel staples. A sterile dressing was applied with Betadine ointment. All needle counts, sponge counts, and instrument counts were correct.  PATIENT DISPOSITION:   PACU - hemodynamically stable.   Cherylynn Ridges 9/2/201412:55 PM

## 2013-03-31 NOTE — Anesthesia Preprocedure Evaluation (Signed)
Anesthesia Evaluation  Patient identified by MRN, date of birth, ID band Patient awake    Reviewed: Allergy & Precautions, H&P , Patient's Chart, lab work & pertinent test results, reviewed documented beta blocker date and time   History of Anesthesia Complications Negative for: history of anesthetic complications  Airway Mallampati: II TM Distance: >3 FB Neck ROM: full    Dental no notable dental hx.    Pulmonary neg pulmonary ROS,  breath sounds clear to auscultation  Pulmonary exam normal       Cardiovascular Exercise Tolerance: Good hypertension, negative cardio ROS  + Valvular Problems/Murmurs Rhythm:regular Rate:Normal     Neuro/Psych negative neurological ROS  negative psych ROS   GI/Hepatic negative GI ROS, Neg liver ROS, GERD-  ,  Endo/Other  negative endocrine ROS  Renal/GU Renal InsufficiencyRenal diseasenegative Renal ROS     Musculoskeletal   Abdominal   Peds  Hematology negative hematology ROS (+)   Anesthesia Other Findings Hypertension     Renal insufficiency        Osteopenia     Allergy        Heart murmur     GERD (gastroesophageal reflux disease)        Anemia     Hypercalcemia    Reproductive/Obstetrics negative OB ROS                           Anesthesia Physical Anesthesia Plan  ASA: II  Anesthesia Plan: MAC   Post-op Pain Management:    Induction:   Airway Management Planned:   Additional Equipment:   Intra-op Plan:   Post-operative Plan:   Informed Consent: I have reviewed the patients History and Physical, chart, labs and discussed the procedure including the risks, benefits and alternatives for the proposed anesthesia with the patient or authorized representative who has indicated his/her understanding and acceptance.   Dental Advisory Given  Plan Discussed with: CRNA, Surgeon and Anesthesiologist  Anesthesia Plan Comments:          Anesthesia Quick Evaluation

## 2013-03-31 NOTE — Consult Note (Signed)
Reason for Consult:Possible lymphoma Referring Physician: [name] is an 72 y.o. female.  HPI: Patient has had recent fatigue and lack of energy related to chronic anemia.  Workup in the hospital has been negative for etiology of her anemia, but recent CT scan has demonstrated significant global lymphadenopathy.  Particularly she has significant enlargement of lymph nodes in the right groin.  She is to have lymph node biopsy today.  Past Medical History  Diagnosis Date  . Hypertension   . Renal insufficiency   . Osteopenia   . Allergy   . Heart murmur   . GERD (gastroesophageal reflux disease)   . Anemia   . Hypercalcemia 03/27/2013    Past Surgical History  Procedure Laterality Date  . Appendectomy    . Colonoscopy  2008  . Spine surgery    . Back surgery      LOWER BACK TWICE  . Abdominal hysterectomy    . Esophagogastroduodenoscopy N/A 03/29/2013    Procedure: ESOPHAGOGASTRODUODENOSCOPY (EGD);  Surgeon: Tonya Elizabeth, MD;  Location: [facility] ENDOSCOPY;  Service: Endoscopy;  Laterality: N/A;    Family History  Problem Relation Age of Onset  . Heart attack Father 69  . Leukemia Mother   . Diabetes Brother   . Diabetes Sister     Social History:  reports that she quit smoking about 52 years ago. Her smoking use included Cigarettes. She has a 1 pack-year smoking history. She has never used smokeless tobacco. She reports that  drinks alcohol. She reports that she does not use illicit drugs.  Allergies:  Allergies  Allergen Reactions  . Penicillins Rash    Medications: I have reviewed the patient's current medications.  Results for orders placed during the hospital encounter of 03/27/13 (from the past 48 hour(s))  HAPTOGLOBIN     Status: Abnormal   Collection Time    03/29/13  5:00 PM      Result Value Range   Haptoglobin 259 (*) 45 - 215 mg/dL   Comment: Performed at [facility]  CBC     Status: Abnormal   Collection Time    03/30/13  5:40 AM       Result Value Range   WBC 4.1  4.0 - 10.5 K/uL   RBC 4.02  3.87 - 5.11 MIL/uL   Hemoglobin 9.9 (*) 12.0 - 15.0 g/dL   HCT 29.5 (*) 62.1 - 30.8 %   MCV 78.1  78.0 - 100.0 fL   MCH 24.6 (*) 26.0 - 34.0 pg   MCHC 31.5  30.0 - 36.0 g/dL   RDW 65.7 (*) 84.6 - 96.2 %   Platelets 143 (*) 150 - 400 K/uL  COMPREHENSIVE METABOLIC PANEL     Status: Abnormal   Collection Time    03/30/13  5:40 AM      Result Value Range   Sodium 135  135 - 145 mEq/L   Potassium 3.7  3.5 - 5.1 mEq/L   Chloride 98  96 - 112 mEq/L   CO2 24  19 - 32 mEq/L   Glucose, Bld 118 (*) 70 - 99 mg/dL   BUN 12  6 - 23 mg/dL   Creatinine, Ser 9.52 (*) 0.50 - 1.10 mg/dL   Calcium 9.9  8.4 - 84.1 mg/dL   Total Protein 5.7 (*) 6.0 - 8.3 g/dL   Albumin 2.0 (*) 3.5 - 5.2 g/dL   AST 24  0 - 37 U/L   ALT 8  0 - 35 U/L  Alkaline Phosphatase 139 (*) 39 - 117 U/L   Total Bilirubin 0.7  0.3 - 1.2 mg/dL   GFR calc non Af Amer 49 (*) >90 mL/min   GFR calc Af Amer 57 (*) >90 mL/min   Comment: (NOTE)     The eGFR has been calculated using the CKD EPI equation.     This calculation has not been validated in all clinical situations.     eGFR's persistently <90 mL/min signify possible Chronic Kidney     Disease.  DIRECT ANTIGLOBULIN TEST     Status: None   Collection Time    03/30/13  5:40 AM      Result Value Range   DAT, complement NEG     DAT, IgG NEG    SAVE SMEAR     Status: None   Collection Time    03/30/13  5:40 AM      Result Value Range   Smear Review SMEAR STAINED AND AVAILABLE FOR REVIEW    CBC     Status: Abnormal   Collection Time    03/31/13  6:00 AM      Result Value Range   WBC 3.6 (*) 4.0 - 10.5 K/uL   RBC 3.88  3.87 - 5.11 MIL/uL   Hemoglobin 9.5 (*) 12.0 - 15.0 g/dL   HCT 16.1 (*) 09.6 - 04.5 %   MCV 77.6 (*) 78.0 - 100.0 fL   MCH 24.5 (*) 26.0 - 34.0 pg   MCHC 31.6  30.0 - 36.0 g/dL   RDW 40.9 (*) 81.1 - 91.4 %   Platelets 137 (*) 150 - 400 K/uL  COMPREHENSIVE METABOLIC PANEL     Status: Abnormal    Collection Time    03/31/13  6:00 AM      Result Value Range   Sodium 134 (*) 135 - 145 mEq/L   Potassium 3.7  3.5 - 5.1 mEq/L   Chloride 99  96 - 112 mEq/L   CO2 26  19 - 32 mEq/L   Glucose, Bld 109 (*) 70 - 99 mg/dL   BUN 10  6 - 23 mg/dL   Creatinine, Ser 7.82  0.50 - 1.10 mg/dL   Calcium 9.8  8.4 - 95.6 mg/dL   Total Protein 5.3 (*) 6.0 - 8.3 g/dL   Albumin 2.0 (*) 3.5 - 5.2 g/dL   AST 24  0 - 37 U/L   ALT 7  0 - 35 U/L   Alkaline Phosphatase 120 (*) 39 - 117 U/L   Total Bilirubin 0.6  0.3 - 1.2 mg/dL   GFR calc non Af Amer 52 (*) >90 mL/min   GFR calc Af Amer 60 (*) >90 mL/min   Comment: (NOTE)     The eGFR has been calculated using the CKD EPI equation.     This calculation has not been validated in all clinical situations.     eGFR's persistently <90 mL/min signify possible Chronic Kidney     Disease.    Ct Chest W Contrast  03/30/2013   CLINICAL DATA:  72 year old female with fever of unknown origin, anemia, blood loss of unknown etiology, possible malignancy, renal insufficiency, hypertension.  EXAM: CT CHEST, ABDOMEN, AND PELVIS WITH CONTRAST  TECHNIQUE: Multidetector CT imaging of the chest, abdomen and pelvis was performed following the standard protocol during bolus administration of intravenous contrast.  CONTRAST:  OMNIPAQUE IOHEXOL 300 MG/ML  SOLN  COMPARISON:  Chest radiographs 03/28/2013 and earlier. Abdominal radiographs 11/25/2004.  FINDINGS: CT CHEST FINDINGS  Major airways are patent. No pericardial or pleural effusion. In the posterior right upper lobe there is mild reticular nodular Peribronchovascular opacity which appears to be inflammatory (series 3, image 17). Otherwise, the lungs are clear.  No acute or suspicious osseous abnormality identified in the chest.  Incidental breast implants.  Negative visualized thoracic inlet. However, there is hilar and mediastinal lymphadenopathy. Right peritracheal node measures up to 15 mm short axis. Hilar nodes  measure up to 20 mm individually right greater than left. Bulky subcarinal lymphadenopathy, measuring up to 22 mm. There is also a right greater than left Mild axillary lymphadenopathy, with individual nodes measuring up tip 20 mm short axis. No breast soft tissue mass identified.  Major mediastinal vascular structures remain patent.  CT ABDOMEN AND PELVIS FINDINGS  Splenomegaly with permeative/miliary hypodensity scattered in the splenic parenchyma. Bulky right greater than left retroperitoneal lymphadenopathy, with AE right aortocaval nodal conglomeration measuring up to 36 mm short axis. Less pronounced splenic hilum, gastrohepatic ligament, and porta hepatis lymphadenopathy. Retroperitoneal lymphadenopathy continues into the pelvis. Left iliac bifurcation node measures 23 mm short axis. Right inguinal rounded lymph node measures 24 mm short axis. Smaller round inguinal lymph nodes on the left. Abdominal mesenteric nodes also appear enlarged measuring up to 12 mm short axis.  Trace pelvic free fluid. No abdominal free fluid. Oral contrast has reached the rectum. Uterus and adnexal within normal limits. Unremarkable bladder. Redundant left colon. Negative right colon and appendix. No dilated small bowel. Decompressed stomach. Negative pancreas, adrenal glands, kidneys, and gallbladder. Low density throughout the liver suggesting steatosis. Major arterial structures in the abdomen and pelvis are patent. Portal venous system within normal limits.  Sequelae of L3-L4 and L4-L5 fusion. 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 No acute or suspicious osseous lesion in the abdomen or pelvis.  IMPRESSION: CT CHEST IMPRESSION  1. Mediastinal, hilar, and axillary lymphadenopathy, in conjunction with the abdomen and pelvis findings, most compatible with non-Hodgkin's lymphoma.  2. Trace abnormal lung opacity in the  posterior right upper lobe may reflect mild acute infection or chronic postinflammatory change.  CT ABDOMEN AND PELVIS IMPRESSION  1. 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  2. Abdominal and pelvic lymphadenopathy maximal in the retroperitoneum and right inguinal nodal stations.  3. 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  4. Hepatic steatosis.   Electronically Signed   By: Augusto Gamble   On: 03/30/2013 20:31   US Abdomen Complete  03/30/2013   *RADIOLOGY REPORT*  Clinical Data:  Elevated liver function tests  COMPLETE ABDOMINAL ULTRASOUND  Comparison:  None.  Findings:  Gallbladder:  Well distended with evidence of gallbladder sludge. No definitive stones are seen.  Wall thickening is noted.  Common bile duct:  5.1 mm.  This is within normal limits.  Liver:  A focal area of increased echogenicity is identified adjacent to the gallbladder fossa likely related to focal fatty infiltration or a small hemangioma.  IVC:  Appears normal.  Pancreas:  No focal abnormality seen.  Spleen:  13.4 cm.  Diffusely heterogeneous  and mildly enlarged.  Right Kidney:  10.7 cm.  No mass lesion or hydronephrosis is noted.  Left Kidney:  11 cm without mass lesion or hydronephrosis.  Abdominal aorta:  No aneurysm identified.  Adjacent to the aorta  near the origin of the superior mesenteric artery there is a 4.5 by 2.5 cm hypoechoic area which may represent a lymph node.  IMPRESSION: Gallbladder sludge.  Hyper echoic lesion within the liver of uncertain significance.  Splenomegaly with diffuse heterogeneity.  Hypoechoic area adjacent to the proximal aorta of uncertain significance.  CT of the abdomen and pelvis with contrast may be helpful for further evaluation.   Original Report Authenticated By: Alcide Clever, M.D.   Ct Abdomen Pelvis W Contrast  03/30/2013   CLINICAL DATA:   72 year old female with fever of unknown origin, anemia, blood loss of unknown etiology, possible malignancy, renal insufficiency, hypertension.  EXAM: CT CHEST, ABDOMEN, AND PELVIS WITH CONTRAST  TECHNIQUE: Multidetector CT imaging of the chest, abdomen and pelvis was performed following the standard protocol during bolus administration of intravenous contrast.  CONTRAST:  OMNIPAQUE IOHEXOL 300 MG/ML  SOLN  COMPARISON:  Chest radiographs 03/28/2013 and earlier. Abdominal radiographs 11/25/2004.  FINDINGS: CT CHEST FINDINGS  Major airways are patent. No pericardial or pleural effusion. In the posterior right upper lobe there is mild reticular nodular Peribronchovascular opacity which appears to be inflammatory (series 3, image 17). Otherwise, the lungs are clear.  No acute or suspicious osseous abnormality identified in the chest.  Incidental breast implants.  Negative visualized thoracic inlet. However, there is hilar and mediastinal lymphadenopathy. Right peritracheal node measures up to 15 mm short axis. Hilar nodes measure up to 20 mm individually right greater than left. Bulky subcarinal lymphadenopathy, measuring up to 22 mm. There is also a right greater than left Mild axillary lymphadenopathy, with individual nodes measuring up tip 20 mm short axis. No breast soft tissue mass identified.  Major mediastinal vascular structures remain patent.  CT ABDOMEN AND PELVIS FINDINGS  Splenomegaly with permeative/miliary hypodensity scattered in the splenic parenchyma. Bulky right greater than left retroperitoneal lymphadenopathy, with AE right aortocaval nodal conglomeration measuring up to 36 mm short axis. Less pronounced splenic hilum, gastrohepatic ligament, and porta hepatis lymphadenopathy. Retroperitoneal lymphadenopathy continues into the pelvis. Left iliac bifurcation node measures 23 mm short axis. Right inguinal rounded lymph node measures 24 mm short axis. Smaller round inguinal lymph nodes on the  left. Abdominal mesenteric nodes also appear enlarged measuring up to 12 mm short axis.  Trace pelvic free fluid. No abdominal free fluid. Oral contrast has reached the rectum. Uterus and adnexal within normal limits. Unremarkable bladder. Redundant left colon. Negative right colon and appendix. No dilated small bowel. Decompressed stomach. Negative pancreas, adrenal glands, kidneys, and gallbladder. Low density throughout the liver suggesting steatosis. Major arterial structures in the abdomen and pelvis are patent. Portal venous system within normal limits.  Sequelae of L3-L4 and L4-L5 fusion. 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 No acute or suspicious osseous lesion in the abdomen or pelvis.  IMPRESSION: CT CHEST IMPRESSION  1. Mediastinal, hilar, and axillary lymphadenopathy, in conjunction with the abdomen and pelvis findings, most compatible with non-Hodgkin's lymphoma.  2. Trace abnormal lung opacity in the posterior right upper lobe may reflect mild acute infection or chronic postinflammatory change.  CT ABDOMEN AND PELVIS IMPRESSION  1. 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  2. Abdominal and pelvic lymphadenopathy maximal in the retroperitoneum and right inguinal nodal stations.  3. 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  4. Hepatic steatosis.   Electronically Signed   By: Augusto Gamble   On: 03/30/2013 20:31    ROS Blood pressure 129/72, pulse 96, temperature 100.1 F (37.8 C), temperature source Oral, resp. rate 20, height 5\' 2"  (1.575 m), weight 73 kg (160 lb 15 oz), SpO2 91.00%. Physical Exam  Constitutional: She is oriented to person, place, and time. She appears well-developed and well-nourished.  Mildly overweight   HENT:  Head: Normocephalic and atraumatic.  Eyes: Pupils are equal, round, and reactive to light.  Neck: Normal range of motion.  Cardiovascular: Normal rate.   Respiratory: Effort normal.  GI: Soft.  Lymphadenopathy:    She has cervical adenopathy.    She has axillary adenopathy.       Right: Inguinal adenopathy present.       Left: Inguinal adenopathy present.  Neurological: She is alert and oriented to person, place, and time. She has normal reflexes.  Psychiatric: She has a normal mood and affect. Her behavior is normal. Judgment normal.    Assessment/Plan: Global adenopathy with accessible right inguinal lymph node for excisional biopsy.  To go to surgery for biopsy today.  Cherylynn Ridges 03/31/2013, 9:19 AM

## 2013-04-01 DIAGNOSIS — N289 Disorder of kidney and ureter, unspecified: Secondary | ICD-10-CM

## 2013-04-01 LAB — PARATHYROID HORMONE, INTACT (NO CA): PTH: <2.5 pg/mL — ABNORMAL LOW (ref 14.0–72.0)

## 2013-04-01 LAB — PROTEIN ELECTROPHORESIS, SERUM
Alpha-2-Globulin: 21.8 % — ABNORMAL HIGH (ref 7.1–11.8)
Beta 2: 7 % — ABNORMAL HIGH (ref 3.2–6.5)
Beta Globulin: 4.9 % (ref 4.7–7.2)
Gamma Globulin: 7 % — ABNORMAL LOW (ref 11.1–18.8)
M-Spike, %: NOT DETECTED g/dL

## 2013-04-01 LAB — UIFE/LIGHT CHAINS/TP QN, 24-HR UR
Albumin, U: DETECTED
Alpha 1, Urine: DETECTED — AB
Alpha 2, Urine: DETECTED — AB
Beta, Urine: DETECTED — AB
Gamma Globulin, Urine: DETECTED — AB

## 2013-04-01 MED ORDER — ACETAMINOPHEN 325 MG PO TABS: 650.0000 mg | ORAL_TABLET | Freq: Four times a day (QID) | ORAL | Status: DC | PRN

## 2013-04-01 NOTE — Progress Notes (Signed)
PATIENT DETAILS Name: Tonya Wise Age: 72 y.o. Sex: female Date of Birth: Mar 13, 1941 Admit Date: 03/27/2013 Admitting Physician Jeralyn Bennett, MD HYQ:MVHQION,GEXB Leonette Most, MD  Brief summary Patient is a 72 year old Caucasian female with a past medical history of hypertension, mild chronic kidney disease, prior history of diarrhea with workup including EGD/colonoscopy negative, was admitted for weakness and malaise. Found to have a hemoglobin of 6.4 by her PCP and referred to the hospitalist service for further evaluation and treatment. This was new-onset anemia, and was microcytic, initial suspicion was for a recent upper GI bleed, although FOBT was negative. She was also found to have mild hypercalcemia, and renal failure. GI was consulted, EGD was done which did not show any foci of bleeding. Colonoscopy was not done as it was recently done as an outpatient within the past year. Since she continued to have unexplained anemia, further workup was initiated, which essentially revealed a elevated LDH level, a CT scan of the chest and abdomen was done which showed significant lymphadenopathy. Patient during this hospital course, was found to have fever with some night sweats. 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 [facility] has been consulted for right inguinal lymph node excisional biopsy, oncology has also been consulted.  Subjective: Some drainage from incision. Some pain.  Assessment/Plan: Active Problems: Anemia  - Patient was admitted and transfused a total of 3 units of PRBC, hemoglobin remained stable post transfusion -suspected initially to be from recent blood loss, although a FOBT was neg on admission. EGD done on 8/31 did not show any foci for bleeding. She recently had a colonoscopy within the past year, that was essentially negative. At this time GI does not recommend any further endoscopic evaluation. Since, anemia continues to be  unexplained, further workup was initiated.  LDH significantly elevated at 380, haptoglobin also negative. Coombs test negative. SPEP/UPEP pending. 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 - Have consulted [facility] today, for right inguinal excisional lymph node biopsy. - Hematology/oncology on board - Small bowel biopsies negative  Intra-abdominal/thoracic lymphadenopathy - Highly suspicious for lymphoma with B. symptoms-have consulted [facility] for right inguinal excisional lymph node biopsy - Suspect fever, lymphadenopathy and mild hypercalcemia all secondary to lymphoma.  Fever - Blood cultures negative so far. UA negative for UTI. Chest x-ray negative for pneumonia. CT scan of the chest and abdomen negative for any foci of infection. Lower extremity Dopplers also negative for DVT. - Suspect fever and night sweats are secondary to lymphoma with B. Symptoms -Briefly was on IV Rocephin, but this was discontinued, and we are just watching off Abx  Acute on chronic renal failure stage II- III -likely secondary to prerenal azotemia in setting of volume depletion from night sweats, fever and diuretic therapy -better with IVF - As noted above, SPEP/UPEP pending  Gastroesophageal reflux disease  -On IV PPI therapy-change to oral PPI on discharge  Mild Hypercalcemia -better with IVF -monitor -given mild renal failure, anemia-SPEP/UPEP pending, however suspect this is all secondary to hypercalcemia-suspect 1, 25 hydroxy vitamin D level to be elevated. Will order workup-PTH, PTH related peptide and a vitamin D panel. Please follow. -when corrected to albumin-Ca is still high  HTN - Controlled with bisoprolol  Moderate protein calorie malnutrition - Secondary to chronic disease  Disposition: Stable for discharge home. Requests home health nurse.  DVT Prophylaxis:   SCD's  Code Status: Full code  Family Communication 2 daughters at bedside and husband  Procedures:  EGD-8/31  CONSULTS:  GI Hematology/oncology [facility]  MEDICATIONS: Scheduled Meds: . bisoprolol  10 mg Oral Daily  . povidone-iodine   Topical Daily  . sodium chloride  3 mL Intravenous Q12H   Continuous Infusions: . sodium chloride 50 mL/hr at 03/31/13 0949  . lactated ringers     PRN Meds:.acetaminophen, acetaminophen, fentaNYL, meperidine (DEMEROL) injection, ondansetron (ZOFRAN) IV, ondansetron, oxyCODONE, promethazine  Antibiotics: Anti-infectives   Start     Dose/Rate Route Frequency Ordered Stop   03/31/13 0930  ciprofloxacin (CIPRO) IVPB 400 mg     400 mg 200 mL/hr over 60 Minutes Intravenous On call to O.R. 03/31/13 0918 03/31/13 1214   03/29/13 0000  cefTRIAXone (ROCEPHIN) 1 g in dextrose 5 % 50 mL IVPB  Status:  Discontinued     1 g 100 mL/hr over 30 Minutes Intravenous Every 24 hours 03/28/13 2251 03/30/13 1033   03/28/13 2245  metroNIDAZOLE (FLAGYL) IVPB 500 mg  Status:  Discontinued     500 mg 100 mL/hr over 60 Minutes Intravenous 3 times per day 03/28/13 2228 03/29/13 1205       PHYSICAL EXAM: Vital signs in last 24 hours: Filed Vitals:   03/31/13 1600 03/31/13 2215 03/31/13 2340 04/01/13 0459  BP: 102/66 112/67  148/80  Pulse: 83 100  102  Temp: 98.5 F (36.9 C) 101.3 F (38.5 C) 98.1 F (36.7 C) 100.2 F (37.9 C)  TempSrc:  Oral  Oral  Resp: 18 20  18   Height:      Weight:    73.7 kg (162 lb 7.7 oz)  SpO2: 95% 93%  93%    Weight change: 0.7 kg (1 lb 8.7 oz) Filed Weights   03/29/13 0447 03/31/13 0521 04/01/13 0459  Weight: 71 kg (156 lb 8.4 oz) 73 kg (160 lb 15 oz) 73.7 kg (162 lb 7.7 oz)   Body mass index is 29.71 kg/(m^2).   Gen Exam: Awake and alert with clear speech.   Neck: Supple, No JVD.   Chest: B/L Clear.   CVS: S1 S2 Regular Abdomen: soft, BS +, non tender, non distended.  Extremities: no edema, right groin incision with  no erythema. Dry serous drainage on dressing. Neurologic: Non Focal.   Skin: No Rash.   Wounds: N/A.    Intake/Output from previous day:  Intake/Output Summary (Last 24 hours) at 04/01/13 1015 Last data filed at 04/01/13 0900  Gross per 24 hour  Intake 1146.83 ml  Output      0 ml  Net 1146.83 ml     LAB RESULTS: CBC  Recent Labs Lab 03/26/13 1338 03/27/13 1206 03/28/13 0400 03/29/13 0459 03/30/13 0540 03/31/13 0600  WBC 5.5 6.9 4.8 4.6 4.1 3.6*  HGB 7.9* 6.4* 10.3* 10.6* 9.9* 9.5*  HCT 26.2* 21.3* 31.4* 32.5* 31.4* 30.1*  PLT 237 240 163 164 143* 137*  MCV 72.8* 75.0* 76.2* 77.2* 78.1 77.6*  MCH 21.9* 22.5* 25.0* 25.2* 24.6* 24.5*  MCHC 30.2 30.0 32.8 32.6 31.5 31.6  RDW 17.6* 17.3* 16.4* 17.2* 17.4* 17.5*  LYMPHSABS 0.8 0.9  --   --   --   --   MONOABS 0.8 1.1*  --   --   --   --   EOSABS 0.0 0.0  --   --   --   --   BASOSABS 0.0 0.0  --   --   --   --     Chemistries  Recent Labs Lab 03/27/13 1206 03/28/13 0400 03/29/13 0459 03/30/13 0540 03/31/13 0600  NA 133* 137 136 135 134*  K 4.2 3.7 3.7 3.7 3.7  CL 92* 97 97 98 99  CO2 26 26 26 24 26   GLUCOSE 233* 125* 123* 118* 109*  BUN 17 14 13 12 10   CREATININE 1.60* 1.42* 1.21* 1.11* 1.06  CALCIUM 11.2* 10.1 10.3 9.9 9.8    CBG: No results found for this basename: GLUCAP,  in the last 168 hours  GFR Estimated Creatinine Clearance: 45.7 ml/min (by C-G formula based on Cr of 1.06).  Coagulation profile  Recent Labs Lab 03/27/13 1206  INR 1.33    Cardiac Enzymes  Recent Labs Lab 03/27/13 1206  TROPONINI <0.30    No components found with this basename: POCBNP,  No results found for this basename: DDIMER,  in the last 72 hours No results found for this basename: HGBA1C,  in the last 72 hours No results found for this basename: CHOL, HDL, LDLCALC, TRIG, CHOLHDL, LDLDIRECT,  in the last 72 hours No results found for this basename: TSH, T4TOTAL, FREET3, T3FREE, THYROIDAB,  in the last 72  hours  Recent Labs  03/31/13 0600  FERRITIN 3608*  TIBC Not calculated due to Iron <10.  IRON <10*   No results found for this basename: LIPASE, AMYLASE,  in the last 72 hours  Urine Studies No results found for this basename: UACOL, UAPR, USPG, UPH, UTP, UGL, UKET, UBIL, UHGB, UNIT, UROB, ULEU, UEPI, UWBC, URBC, UBAC, CAST, CRYS, UCOM, BILUA,  in the last 72 hours  MICROBIOLOGY: Recent Results (from the past 240 hour(s))  CULTURE, BLOOD (ROUTINE X 2)     Status: None   Collection Time    03/28/13 10:40 PM      Result Value Range Status   Specimen Description BLOOD LEFT ARM   Final   Special Requests BOTTLES DRAWN AEROBIC AND ANAEROBIC 10CC   Final   Culture  Setup Time     Final   Value: 03/29/2013 02:04     Performed at [facility]   Culture     Final   Value:        BLOOD CULTURE RECEIVED NO GROWTH TO DATE CULTURE WILL BE HELD FOR 5 DAYS BEFORE ISSUING A FINAL NEGATIVE REPORT     Performed at [facility]   Report Status PENDING   Incomplete  CULTURE, BLOOD (ROUTINE X 2)     Status: None   Collection Time    03/28/13 10:50 PM      Result Value Range Status   Specimen Description BLOOD LEFT HAND   Final   Special Requests BOTTLES DRAWN AEROBIC ONLY 10CC   Final   Culture  Setup Time     Final   Value: 03/29/2013 02:04     Performed at [facility]   Culture     Final   Value:        BLOOD CULTURE RECEIVED NO GROWTH TO DATE CULTURE WILL BE HELD FOR 5 DAYS BEFORE ISSUING A FINAL NEGATIVE REPORT     Performed at [facility]   Report Status PENDING   Incomplete    RADIOLOGY STUDIES/RESULTS: Dg Chest 2 View  03/27/2013   *RADIOLOGY REPORT*  Clinical Data: Shortness of breath  CHEST - 2 VIEW  Comparison: 03/12/13  Findings: The heart and pulmonary vascularity are within normal limits.  The lungs are clear bilaterally.  No acute bony abnormality is seen.  IMPRESSION: No acute abnormality noted.   Original Report Authenticated By: Alcide Clever,  M.D.   Dg Chest 2 View  03/12/2013   *RADIOLOGY REPORT*  Clinical Data: Cough  CHEST - 2 VIEW  Comparison: 03/09/2011  Findings: Cardiomediastinal silhouette is stable.  No acute infiltrate or pleural effusion. Central mild bronchitic changes. No pulmonary edema.  Partially visualized postsurgical changes lower lumbar spine.  IMPRESSION: Central mild bronchitic changes. No acute infiltrate or pulmonary edema.   Original Report Authenticated By: Natasha Mead, M.D.    Christiane Ha, MD  [facility]  If 7PM-7AM, please contact night-coverage [email or web address] Password [email or web address] 04/01/2013, 10:15 AM   LOS: 5 days

## 2013-04-01 NOTE — Progress Notes (Signed)
Tonya Wise to be D/C'd Home per MD order.  Discussed with the patient and all questions fully answered.    Medication List    STOP taking these medications       amLODipine 5 MG tablet  Commonly known as:  NORVASC     bisoprolol-hydrochlorothiazide 10-6.25 MG per tablet  Commonly known as:  ZIAC     CALCIUM + D PO     nystatin 100000 UNIT/ML suspension  Commonly known as:  MYCOSTATIN     ondansetron 8 MG tablet  Commonly known as:  ZOFRAN     OSTEO BI-FLEX ADV TRIPLE ST PO      TAKE these medications       aspirin 81 MG tablet  Take 1 tablet (81 mg total) by mouth daily.     bisoprolol 10 MG tablet  Commonly known as:  ZEBETA  Take 1 tablet (10 mg total) by mouth daily.     omeprazole 40 MG capsule  Commonly known as:  PRILOSEC  Take 1 capsule (40 mg total) by mouth daily.     oxyCODONE 5 MG immediate release tablet  Commonly known as:  Oxy IR/ROXICODONE  Take 1 tablet (5 mg total) by mouth every 6 (six) hours as needed.        VVS, Skin clean, dry and intact without evidence of skin break down, no evidence of skin tears noted. IV catheter discontinued intact. Site without signs and symptoms of complications. Dressing and pressure applied. Inguinal biopsy site to right groin - staples clean dry and intact. Iodine and gauze applied.   An After Visit Summary was printed and given to the patient. Patient escorted via WC, and D/C home via private auto.  Driggers, Rae Roam 04/01/2013 9:59 AM

## 2013-04-01 NOTE — Progress Notes (Signed)
Patient can go home at anytime.  Tonya Wise. Gae Bon, MD, FACS [phone] [facility]

## 2013-04-01 NOTE — Progress Notes (Signed)
1 Day Post-Op  Subjective: Pt endorses some pain in inguinal biopsy site.  Objective: Vital signs in last 24 hours: Temp:  [97.3 F (36.3 C)-101.3 F (38.5 C)] 100.2 F (37.9 C) (09/03 0459) Pulse Rate:  [78-102] 102 (09/03 0459) Resp:  [16-26] 18 (09/03 0459) BP: (98-148)/(52-80) 148/80 mmHg (09/03 0459) SpO2:  [93 %-99 %] 93 % (09/03 0459) Weight:  [162 lb 7.7 oz (73.7 kg)] 162 lb 7.7 oz (73.7 kg) (09/03 0459) Last BM Date: 03/30/13  Intake/Output from previous day: 09/02 0701 - 09/03 0700 In: 910.8 [I.V.:910.8] Out: -  Intake/Output this shift:    PE: Skin: R inguinal incision clean, without erythema. Dressing has residual betadine present. No active leaking visible. Staples in place.  Lab Results:   Recent Labs  03/30/13 0540 03/31/13 0600  WBC 4.1 3.6*  HGB 9.9* 9.5*  HCT 31.4* 30.1*  PLT 143* 137*   BMET  Recent Labs  03/30/13 0540 03/31/13 0600  NA 135 134*  K 3.7 3.7  CL 98 99  CO2 24 26  GLUCOSE 118* 109*  BUN 12 10  CREATININE 1.11* 1.06  CALCIUM 9.9 9.8   PT/INR No results found for this basename: LABPROT, INR,  in the last 72 hours CMP     Component Value Date/Time   NA 134* 03/31/2013 0600   K 3.7 03/31/2013 0600   CL 99 03/31/2013 0600   CO2 26 03/31/2013 0600   GLUCOSE 109* 03/31/2013 0600   BUN 10 03/31/2013 0600   CREATININE 1.06 03/31/2013 0600   CREATININE 1.37* 03/26/2013 1338   CALCIUM 9.8 03/31/2013 0600   PROT 5.3* 03/31/2013 0600   ALBUMIN 2.0* 03/31/2013 0600   AST 24 03/31/2013 0600   ALT 7 03/31/2013 0600   ALKPHOS 120* 03/31/2013 0600   BILITOT 0.6 03/31/2013 0600   GFRNONAA 52* 03/31/2013 0600   GFRAA 60* 03/31/2013 0600   Lipase     Component Value Date/Time   LIPASE 54 03/27/2013 1206       Studies/Results: Ct Chest W Contrast  03/30/2013   CLINICAL DATA:  72 year old female with fever of unknown origin, anemia, blood loss of unknown etiology, possible malignancy, renal insufficiency, hypertension.  EXAM: CT CHEST, ABDOMEN, AND PELVIS  WITH CONTRAST  TECHNIQUE: Multidetector CT imaging of the chest, abdomen and pelvis was performed following the standard protocol during bolus administration of intravenous contrast.  CONTRAST:  OMNIPAQUE IOHEXOL 300 MG/ML  SOLN  COMPARISON:  Chest radiographs 03/28/2013 and earlier. Abdominal radiographs 11/25/2004.  FINDINGS: CT CHEST FINDINGS  Major airways are patent. No pericardial or pleural effusion. In the posterior right upper lobe there is mild reticular nodular Peribronchovascular opacity which appears to be inflammatory (series 3, image 17). Otherwise, the lungs are clear.  No acute or suspicious osseous abnormality identified in the chest.  Incidental breast implants.  Negative visualized thoracic inlet. However, there is hilar and mediastinal lymphadenopathy. Right peritracheal node measures up to 15 mm short axis. Hilar nodes measure up to 20 mm individually right greater than left. Bulky subcarinal lymphadenopathy, measuring up to 22 mm. There is also a right greater than left Mild axillary lymphadenopathy, with individual nodes measuring up tip 20 mm short axis. No breast soft tissue mass identified.  Major mediastinal vascular structures remain patent.  CT ABDOMEN AND PELVIS FINDINGS  Splenomegaly with permeative/miliary hypodensity scattered in the splenic parenchyma. Bulky right greater than left retroperitoneal lymphadenopathy, with AE right aortocaval nodal conglomeration measuring up to 36 mm short axis. Less  pronounced splenic hilum, gastrohepatic ligament, and porta hepatis lymphadenopathy. Retroperitoneal lymphadenopathy continues into the pelvis. Left iliac bifurcation node measures 23 mm short axis. Right inguinal rounded lymph node measures 24 mm short axis. Smaller round inguinal lymph nodes on the left. Abdominal mesenteric nodes also appear enlarged measuring up to 12 mm short axis.  Trace pelvic free fluid. No abdominal free fluid. Oral contrast has reached the rectum. Uterus  and adnexal within normal limits. Unremarkable bladder. Redundant left colon. Negative right colon and appendix. No dilated small bowel. Decompressed stomach. Negative pancreas, adrenal glands, kidneys, and gallbladder. Low density throughout the liver suggesting steatosis. Major arterial structures in the abdomen and pelvis are patent. Portal venous system within normal limits.  Sequelae of L3-L4 and L4-L5 fusion. 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 No acute or suspicious osseous lesion in the abdomen or pelvis.  IMPRESSION: CT CHEST IMPRESSION  1. Mediastinal, hilar, and axillary lymphadenopathy, in conjunction with the abdomen and pelvis findings, most compatible with non-Hodgkin's lymphoma.  2. Trace abnormal lung opacity in the posterior right upper lobe may reflect mild acute infection or chronic postinflammatory change.  CT ABDOMEN AND PELVIS IMPRESSION  1. 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  2. Abdominal and pelvic lymphadenopathy maximal in the retroperitoneum and right inguinal nodal stations.  3. 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  4. Hepatic steatosis.   Electronically Signed   By: Augusto Gamble   On: 03/30/2013 20:31   Ct Abdomen Pelvis W Contrast  03/30/2013   CLINICAL DATA:  72 year old female with fever of unknown origin, anemia, blood loss of unknown etiology, possible malignancy, renal insufficiency, hypertension.  EXAM: CT CHEST, ABDOMEN, AND PELVIS WITH CONTRAST  TECHNIQUE: Multidetector CT imaging of the chest, abdomen and pelvis was performed following the standard protocol during bolus administration of intravenous contrast.  CONTRAST:  OMNIPAQUE IOHEXOL 300 MG/ML  SOLN  COMPARISON:  Chest radiographs  03/28/2013 and earlier. Abdominal radiographs 11/25/2004.  FINDINGS: CT CHEST FINDINGS  Major airways are patent. No pericardial or pleural effusion. In the posterior right upper lobe there is mild reticular nodular Peribronchovascular opacity which appears to be inflammatory (series 3, image 17). Otherwise, the lungs are clear.  No acute or suspicious osseous abnormality identified in the chest.  Incidental breast implants.  Negative visualized thoracic inlet. However, there is hilar and mediastinal lymphadenopathy. Right peritracheal node measures up to 15 mm short axis. Hilar nodes measure up to 20 mm individually right greater than left. Bulky subcarinal lymphadenopathy, measuring up to 22 mm. There is also a right greater than left Mild axillary lymphadenopathy, with individual nodes measuring up tip 20 mm short axis. No breast soft tissue mass identified.  Major mediastinal vascular structures remain patent.  CT ABDOMEN AND PELVIS FINDINGS  Splenomegaly with permeative/miliary hypodensity scattered in the splenic parenchyma. Bulky right greater than left retroperitoneal lymphadenopathy, with AE right aortocaval nodal conglomeration measuring up to 36 mm short axis. Less pronounced splenic hilum, gastrohepatic ligament, and porta hepatis lymphadenopathy. Retroperitoneal lymphadenopathy continues into the pelvis. Left iliac bifurcation node measures 23 mm short axis. Right inguinal rounded lymph node measures 24 mm short axis. Smaller round inguinal lymph nodes on the left. Abdominal mesenteric nodes also appear enlarged measuring up to 12 mm short axis.  Trace pelvic  free fluid. No abdominal free fluid. Oral contrast has reached the rectum. Uterus and adnexal within normal limits. Unremarkable bladder. Redundant left colon. Negative right colon and appendix. No dilated small bowel. Decompressed stomach. Negative pancreas, adrenal glands, kidneys, and gallbladder. Low density throughout the liver suggesting  steatosis. Major arterial structures in the abdomen and pelvis are patent. Portal venous system within normal limits.  Sequelae of L3-L4 and L4-L5 fusion. 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 No acute or suspicious osseous lesion in the abdomen or pelvis.  IMPRESSION: CT CHEST IMPRESSION  1. Mediastinal, hilar, and axillary lymphadenopathy, in conjunction with the abdomen and pelvis findings, most compatible with non-Hodgkin's lymphoma.  2. Trace abnormal lung opacity in the posterior right upper lobe may reflect mild acute infection or chronic postinflammatory change.  CT ABDOMEN AND PELVIS IMPRESSION  1. 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  2. Abdominal and pelvic lymphadenopathy maximal in the retroperitoneum and right inguinal nodal stations.  3. 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  4. Hepatic steatosis.   Electronically Signed   By: Augusto Gamble   On: 03/30/2013 20:31    Anti-infectives: Anti-infectives   Start     Dose/Rate Route Frequency Ordered Stop   03/31/13 0930  ciprofloxacin (CIPRO) IVPB 400 mg     400 mg 200 mL/hr over 60 Minutes Intravenous On call to O.R. 03/31/13 0918 03/31/13 1214   03/29/13 0000  cefTRIAXone (ROCEPHIN) 1 g in dextrose 5 % 50 mL IVPB  Status:  Discontinued     1 g 100 mL/hr over 30 Minutes Intravenous Every 24 hours 03/28/13 2251 03/30/13 1033   03/28/13 2245  metroNIDAZOLE (FLAGYL) IVPB 500 mg  Status:  Discontinued     500 mg 100 mL/hr over 60 Minutes Intravenous 3 times per day 03/28/13 2228 03/29/13 1205       Assessment/Plan  A: likely lymphoma, s/p R inguinal node biopsy  P: 1. Okay to d/c home today from a surgical standpoint. Needs outpatient f/u in 10 days for removal of  staples - f/u information entered into Epic. Encouraged patient to utilize pain medicine for pain control after biopsy.    LOS: 5 days    Levert Feinstein 04/01/2013, 8:42 AM PA Pager: [phone]

## 2013-04-01 NOTE — Care Management Note (Signed)
    Page 1 of 1   04/01/2013     10:26:03 AM   CARE MANAGEMENT NOTE 04/01/2013  Patient:  ANAKA, BEAZER   Account Number:  [id number]  Date Initiated:  04/01/2013  Documentation initiated by:  Letha Cape  Subjective/Objective Assessment:   dx hypercalcemia  admit- lives with spouse.     Action/Plan:   lymph bx 9/2   Anticipated DC Date:  04/01/2013   Anticipated DC Plan:  HOME W HOME HEALTH SERVICES      DC Planning Services  CM consult      [facility] Choice  HOME HEALTH   Choice offered to / List presented to:  C-1 Patient        HH arranged  HH-1 RN      [facility] agency  [facility]   Status of service:  Completed, signed off Medicare Important Message given?   (If response is "NO", the following Medicare IM given date fields will be blank) Date Medicare IM given:   Date Additional Medicare IM given:    Discharge Disposition:  HOME W HOME HEALTH SERVICES  Per UR Regulation:  Reviewed for med. necessity/level of care/duration of stay  If discussed at Long Length of Stay Meetings, dates discussed:    Comments:  04/01/13 10;24 Letha Cape RN, BSN [phone] patient lives with spouse, patient has medication coverage and transportation at dc.  Patient chose [facility] for [facility] for wound care at dc, referral made to [facility], Christy notified. Sloc will begin 24-48 hrs post discharge.

## 2013-04-02 ENCOUNTER — Encounter (HOSPITAL_COMMUNITY): Payer: Self-pay | Admitting: General Surgery

## 2013-04-02 DIAGNOSIS — R599 Enlarged lymph nodes, unspecified: Secondary | ICD-10-CM | POA: Diagnosis not present

## 2013-04-02 DIAGNOSIS — I129 Hypertensive chronic kidney disease with stage 1 through stage 4 chronic kidney disease, or unspecified chronic kidney disease: Secondary | ICD-10-CM | POA: Diagnosis not present

## 2013-04-02 DIAGNOSIS — D649 Anemia, unspecified: Secondary | ICD-10-CM | POA: Diagnosis not present

## 2013-04-02 DIAGNOSIS — Z48817 Encounter for surgical aftercare following surgery on the skin and subcutaneous tissue: Secondary | ICD-10-CM | POA: Diagnosis not present

## 2013-04-02 LAB — IMMUNOFIXATION ELECTROPHORESIS
IgA: 37 mg/dL — ABNORMAL LOW (ref 69–380)
IgM, Serum: 26 mg/dL — ABNORMAL LOW (ref 52–322)

## 2013-04-03 NOTE — Progress Notes (Signed)
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  Discussed results with patient and husband via telephone.  Appointment with oncology Monday.  Crista Curb, M.D.

## 2013-04-04 LAB — VITAMIN D 1,25 DIHYDROXY
Vitamin D 1, 25 (OH)2 Total: 164 pg/mL — ABNORMAL HIGH (ref 18–72)
Vitamin D2 1, 25 (OH)2: <8 pg/mL

## 2013-04-04 LAB — CULTURE, BLOOD (ROUTINE X 2)
Culture: NO GROWTH
Culture: NO GROWTH

## 2013-04-06 ENCOUNTER — Ambulatory Visit (HOSPITAL_BASED_OUTPATIENT_CLINIC_OR_DEPARTMENT_OTHER): Payer: Medicare Other | Admitting: Internal Medicine

## 2013-04-06 ENCOUNTER — Telehealth: Payer: Self-pay | Admitting: Internal Medicine

## 2013-04-06 ENCOUNTER — Encounter: Payer: Self-pay | Admitting: Internal Medicine

## 2013-04-06 ENCOUNTER — Encounter: Payer: Self-pay | Admitting: Medical Oncology

## 2013-04-06 ENCOUNTER — Other Ambulatory Visit: Payer: Self-pay | Admitting: Medical Oncology

## 2013-04-06 ENCOUNTER — Ambulatory Visit: Payer: Medicare Other

## 2013-04-06 ENCOUNTER — Ambulatory Visit (HOSPITAL_BASED_OUTPATIENT_CLINIC_OR_DEPARTMENT_OTHER): Payer: Medicare Other

## 2013-04-06 VITALS — BP 123/62 | HR 116 | Temp 99.0°F | Resp 17 | Ht 62.0 in | Wt 155.6 lb

## 2013-04-06 DIAGNOSIS — D649 Anemia, unspecified: Secondary | ICD-10-CM | POA: Diagnosis not present

## 2013-04-06 DIAGNOSIS — R599 Enlarged lymph nodes, unspecified: Secondary | ICD-10-CM | POA: Diagnosis not present

## 2013-04-06 DIAGNOSIS — C8590 Non-Hodgkin lymphoma, unspecified, unspecified site: Secondary | ICD-10-CM

## 2013-04-06 DIAGNOSIS — C8589 Other specified types of non-Hodgkin lymphoma, extranodal and solid organ sites: Secondary | ICD-10-CM | POA: Diagnosis not present

## 2013-04-06 DIAGNOSIS — E46 Unspecified protein-calorie malnutrition: Secondary | ICD-10-CM | POA: Diagnosis not present

## 2013-04-06 DIAGNOSIS — I129 Hypertensive chronic kidney disease with stage 1 through stage 4 chronic kidney disease, or unspecified chronic kidney disease: Secondary | ICD-10-CM | POA: Diagnosis not present

## 2013-04-06 DIAGNOSIS — Z48817 Encounter for surgical aftercare following surgery on the skin and subcutaneous tissue: Secondary | ICD-10-CM | POA: Diagnosis not present

## 2013-04-06 HISTORY — DX: Non-Hodgkin lymphoma, unspecified, unspecified site: C85.90

## 2013-04-06 LAB — COMPREHENSIVE METABOLIC PANEL (CC13)
Albumin: 1.7 g/dL — ABNORMAL LOW (ref 3.5–5.0)
Alkaline Phosphatase: 205 U/L — ABNORMAL HIGH (ref 40–150)
Calcium: 10.5 mg/dL — ABNORMAL HIGH (ref 8.4–10.4)
Chloride: 95 mEq/L — ABNORMAL LOW (ref 98–109)
Glucose: 117 mg/dl (ref 70–140)
Potassium: 3.9 mEq/L (ref 3.5–5.1)
Sodium: 134 mEq/L — ABNORMAL LOW (ref 136–145)
Total Protein: 6 g/dL — ABNORMAL LOW (ref 6.4–8.3)

## 2013-04-06 LAB — CBC WITH DIFFERENTIAL/PLATELET
EOS%: 0.1 % (ref 0.0–7.0)
Eosinophils Absolute: 0 10*3/uL (ref 0.0–0.5)
MCH: 24 pg — ABNORMAL LOW (ref 25.1–34.0)
MCV: 75.2 fL — ABNORMAL LOW (ref 79.5–101.0)
MONO%: 13.9 % (ref 0.0–14.0)
NEUT#: 3.4 10*3/uL (ref 1.5–6.5)
RBC: 3.96 10*6/uL (ref 3.70–5.45)
RDW: 19.7 % — ABNORMAL HIGH (ref 11.2–14.5)

## 2013-04-06 LAB — LACTATE DEHYDROGENASE (CC13): LDH: 337 U/L — ABNORMAL HIGH (ref 125–245)

## 2013-04-06 LAB — PTH-RELATED PEPTIDE: PTH-related peptide: 11 pg/mL — ABNORMAL LOW (ref 14–27)

## 2013-04-06 NOTE — Progress Notes (Signed)
Checked in new pt with no financial concerns. °

## 2013-04-06 NOTE — Telephone Encounter (Signed)
S/W PT IN RE TO TIME CHANGE TO 2:30 FOR HOSP F/U APPT.

## 2013-04-06 NOTE — Patient Instructions (Signed)
Rituximab injection What is this medicine? RITUXIMAB (ri TUX i mab) is a monoclonal antibody. This medicine changes the way the body's immune system works. It is used commonly to treat non-Hodgkin's lymphoma and other conditions. In cancer cells, this drug targets a specific protein within cancer cells and stops the cancer cells from growing. It is also used to treat rhuematoid arthritis (RA). In RA, this medicine slow the inflammatory process and help reduce joint pain and swelling. This medicine is often used with other cancer or arthritis medications. This medicine may be used for other purposes; ask your health care provider or pharmacist if you have questions. What should I tell my health care provider before I take this medicine? They need to know if you have any of these conditions: -blood disorders -heart disease -history of hepatitis B -infection (especially a virus infection such as chickenpox, cold sores, or herpes) -irregular heartbeat -kidney disease -lung or breathing disease, like asthma -lupus -an unusual or allergic reaction to rituximab, mouse proteins, other medicines, foods, dyes, or preservatives -pregnant or trying to get pregnant -breast-feeding How should I use this medicine? This medicine is for infusion into a vein. It is administered in a hospital or clinic by a specially trained health care professional. A special MedGuide will be given to you by the pharmacist with each prescription and refill. Be sure to read this information carefully each time. Talk to your pediatrician regarding the use of this medicine in children. This medicine is not approved for use in children. Overdosage: If you think you have taken too much of this medicine contact a poison control center or emergency room at once. NOTE: This medicine is only for you. Do not share this medicine with others. What if I miss a dose? It is important not to miss a dose. Call your doctor or health care  professional if you are unable to keep an appointment. What may interact with this medicine? -cisplatin -medicines for blood pressure -some other medicines for arthritis -vaccines This list may not describe all possible interactions. Give your health care provider a list of all the medicines, herbs, non-prescription drugs, or dietary supplements you use. Also tell them if you smoke, drink alcohol, or use illegal drugs. Some items may interact with your medicine. What should I watch for while using this medicine? Report any side effects that you notice during your treatment right away, such as changes in your breathing, fever, chills, dizziness or lightheadedness. These effects are more common with the first dose. Visit your prescriber or health care professional for checks on your progress. You will need to have regular blood work. Report any other side effects. The side effects of this medicine can continue after you finish your treatment. Continue your course of treatment even though you feel ill unless your doctor tells you to stop. Call your doctor or health care professional for advice if you get a fever, chills or sore throat, or other symptoms of a cold or flu. Do not treat yourself. This drug decreases your body's ability to fight infections. Try to avoid being around people who are sick. This medicine may increase your risk to bruise or bleed. Call your doctor or health care professional if you notice any unusual bleeding. Be careful brushing and flossing your teeth or using a toothpick because you may get an infection or bleed more easily. If you have any dental work done, tell your dentist you are receiving this medicine. Avoid taking products that contain aspirin, acetaminophen,   ibuprofen, naproxen, or ketoprofen unless instructed by your doctor. These medicines may hide a fever. Do not become pregnant while taking this medicine. Women should inform their doctor if they wish to become  pregnant or think they might be pregnant. There is a potential for serious side effects to an unborn child. Talk to your health care professional or pharmacist for more information. Do not breast-feed an infant while taking this medicine. What side effects may I notice from receiving this medicine? Side effects that you should report to your doctor or health care professional as soon as possible: -allergic reactions like skin rash, itching or hives, swelling of the face, lips, or tongue -low blood counts - this medicine may decrease the number of white blood cells, red blood cells and platelets. You may be at increased risk for infections and bleeding. -signs of infection - fever or chills, cough, sore throat, pain or difficulty passing urine -signs of decreased platelets or bleeding - bruising, pinpoint red spots on the skin, black, tarry stools, blood in the urine -signs of decreased red blood cells - unusually weak or tired, fainting spells, lightheadedness -breathing problems -confused, not responsive -chest pain -fast, irregular heartbeat -feeling faint or lightheaded, falls -mouth sores -redness, blistering, peeling or loosening of the skin, including inside the mouth -stomach pain -swelling of the ankles, feet, or hands -trouble passing urine or change in the amount of urine Side effects that usually do not require medical attention (report to your doctor or other health care professional if they continue or are bothersome): -anxiety -headache -loss of appetite -muscle aches -nausea -night sweats This list may not describe all possible side effects. Call your doctor for medical advice about side effects. You may report side effects to FDA at 1-800-FDA-1088. Where should I keep my medicine? This drug is given in a hospital or clinic and will not be stored at home. NOTE: This sheet is a summary. It may not cover all possible information. If you have questions about this medicine,  talk to your doctor, pharmacist, or health care provider.  2012, [employer]. (03/15/2008 2:04:59 PM)Chemotherapy Many people are apprehensive about chemotherapy due to concerns over uncomfortable side effects. However, managements for side effects have come a long way. Many side effects once associated with chemotherapy can be prevented and/or controlled. WHAT IS CHEMOTHERAPY? Chemotherapy is the general term for any treatment involving the use of chemical agents. Chemotherapy can be given through a vein, most commonly through an implanted port* or PICC line.* It can also be delivered by mouth (orally) in the form of a pill. The main goal of chemotherapy is to kill cancer cells and stop them from growing. It can destroy and eliminate cancer cells where the cancer started (primary tumor location) and throughout the body, often far away from the original cancer. It is a treatment that not only targets the original cancer location, but also the entire body (systemic treatment) for full effect and results. Chemotherapy works by destroying cancer cells. Unfortunately, it cannot tell the difference between a cancer cell and some healthy cells. This results in the death of noncancerous cells, such as hair and blood cells. Harm to healthy cells is what causes side effects. These cells usually repair themselves after chemotherapy. Because some drugs work better together rather than alone, 2 or more drugs are often given at the same time. This is called combination chemotherapy. Depending on the type of cancer and how advanced it is, chemotherapy can be used for different  goals:  Cure the cancer.  Keep the cancer from spreading.  Slow the cancer's growth.  Kill cancer cells that may have spread to other parts of the body from the original tumor.  Relieve symptoms caused by cancer. You and your caregiver will decide what drug or combination of drugs you will get. Your caregiver will choose the  doses, how the drugs will be given, how often, and how long you will get treatment. All of these decisions will depend on the type of cancer, where it is, how big it is, and how it is affecting your normal body functions and overall health. *Implanted port - A device that is implanted under your skin so that medicines may be delivered directly into your blood system. *PICC line (peripherally inserted central catheter) - A long, slender, flexible tube. This tube is often inserted into a vein, typically in the upper arm. The tip stops in the large central vein that leads to your heart. Document Released: 05/13/2007 Document Revised: 10/08/2011 Document Reviewed: 10/28/2008 [facility] Patient Information 2014 [place], [place]. Enlarged Spleen The spleen is an organ located in the upper abdomen under your left ribs. It is a spongelike organ, about the size of an orange, which acts as a filter. The spleen is part of the lymph system and filters the blood. It removes old blood cells and abnormal blood cells. It is also part of the immune response and helps fight infections. An enlarged spleen (splenomegaly) is usually noticed when it is almost twice its normal size. CAUSES  There are many possible causes of an enlarged spleen. These causes include:  Infections (viral, bacterial, or parasitic).  Liver cirrhosis and other liver diseases.  Hemolytic anemia (types of anemia that lower your red blood cell count) and other blood diseases.  Hypersplenism (reduction in many types of blood cells by an enlarged spleen).  Blood cancers (leukemia, Hodgkin's disease).  Metabolic disorders (Gaucher's disease, Niemann-Pick disease).  Tumors and cysts.  Pressure or blood clots in the veins of the spleen.  Connective tissue disorders (lupus, rheumatoid arthritis with Felty's syndrome). SYMPTOMS  An enlarged spleen may not always cause symptoms. If symptoms do occur, they may include:  Pain in the upper left  abdomen (pain may spread to the left shoulder or get worse when you take a breath).  Feeling full without eating or eating only a small amount.  Feeling tired.  Chronic infections.  Bleeding easily. DIAGNOSIS  Tests may include:  Physical examination of the left upper abdomen.  Blood tests to check red and white blood cells and other proteins and enzymes.  Imaging tests, such as abdominal ultrasonography, computerized X-ray scan (computed tomography, CT), and computerized magnetic scan (magnetic resonance imaging, MRI).  Taking a tissue sample (biopsy) of the liver to examine it.  Examining a bone marrow biopsy sample. TREATMENT  Treatment varies depending on the cause of the enlarged spleen. Treatment aims to manage the conditions that cause swelling of the spleen and reduce the size of the spleen. Treatment may include:  Medications to eliminate infection or treat disease.  Radiation therapy.  Blood transfusions.  Vaccinations. If these treatments are not successful, or the cause cannot be determined, surgery to remove the spleen (splenectomy) may be recommended. HOME CARE INSTRUCTIONS   Take all medications as directed.  Take all antibiotics, even if you start to feel better. Discuss with your caregiver the use of a probiotic supplement to prevent stomach upset.  To avoid injury or a ruptured spleen:  Limit activities as directed.  Avoid contact sports.  Wear your seatbelt in the car.  See your caregiver for vaccinations, follow up examinations and testing as directed.  Follow all of your caregiver's instructions on managing the conditions that cause your enlarged spleen. PREVENTION  It is not always possible to prevent an enlarged spleen. Reduce your chances of developing an enlarged spleen:  Practice good hygiene to prevent infection.  Get recommended vaccines to prevent infection. SEEK MEDICAL CARE IF:   You develop a fever (more than 100.96F [38.1 C])  or other signs of infection (chills, feeling unwell).  You experience injury or impact to the spleen area.  Your symptoms do not go away as you and your doctor expected.  You experience increased pain when you take in a breath.  Your symptoms worsen, or you develop new symptoms. MAKE SURE YOU:   Understand these instructions.  Will watch your condition.  Will get help right away if you are not doing well or get worse. Follow up with your caregiver to find out the results of your tests. Not all test results may be available during your visit. If your test results are not back during the visit, make an appointment with your caregiver to find out the results. Do not assume everything is normal if you have not heard from your caregiver or the medical facility. It is important for you to follow up on all of your test results.  Document Released: 01/03/2010 Document Revised: 10/08/2011 Document Reviewed: 01/03/2010 [facility] Patient Information 2014 [place], [place]. Anemia, Frequently Asked Questions WHAT ARE THE SYMPTOMS OF ANEMIA?  Headache.  Difficulty thinking.  Fatigue.  Shortness of breath.  Weakness.  Rapid heartbeat. AT WHAT POINT ARE PEOPLE CONSIDERED ANEMIC?  This varies with gender and age.   Both hemoglobin (Hgb) and hematocrit values are used to define anemia. These lab values are obtained from a complete blood count (CBC) test. This is performed at a caregiver's office.  The normal range of hemoglobin values for adult men is 14.0 g/dL to 16.1 g/dL. For nonpregnant women, values are 12.3 g/dL to 09.6 g/dL.  The World Health Organization defines anemia as less than 12 g/dL for nonpregnant women and less than 13 g/dL for men.  For adult males, the average normal hematocrit is 46%, and the range is 40% to 52%.  For adult females, the average normal hematocrit is 41%, and the range is 35% to 47%.  Values that fall below the lower limits can be a sign of anemia and  should have further checking (evaluation). GROUPS OF PEOPLE WHO ARE AT RISK FOR DEVELOPING ANEMIA INCLUDE:   Infants who are breastfed or taking a formula that is not fortified with iron.  Children going through a rapid growth spurt. The iron available can not keep up with the needs for a red cell mass which must grow with the child.  Women in childbearing years. They need iron because of blood loss during menstruation.  Pregnant women. The growing fetus creates a high demand for iron.  People with ongoing gastrointestinal blood loss are at risk of developing iron deficiency.  Individuals with leukemia or cancer who must receive chemotherapy or radiation to treat their disease. The drugs or radiation used to treat these diseases often decreases the bone marrow's ability to make cells of all classes. This includes red blood cells, white blood cells, and platelets.  Individuals with chronic inflammatory conditions such as rheumatoid arthritis or chronic infections.  The elderly. ARE  SOME TYPES OF ANEMIA INHERITED?   Yes, some types of anemia are due to inherited or genetic defects.  Sickle cell anemia. This occurs most often in people of African, African American, and Mediterranean descent.  Thalassemia (or Cooley's anemia). This type is found in people of Mediterranean and Southeast Asian descent. These types of anemia are common.  [place]. This is rare. CAN CERTAIN MEDICATIONS CAUSE A PERSON TO BECOME ANEMIC?  Yes. For example, drugs to fight cancer (chemotherapeutic agents) often cause anemia. These drugs can slow the bone marrow's ability to make red blood cells. If there are not enough red blood cells, the body does not get enough oxygen. WHAT HEMATOCRIT LEVEL IS REQUIRED TO DONATE BLOOD?  The lower limit of an acceptable hematocrit for blood donors is 38%. If you have a low hematocrit value, you should schedule an appointment with your caregiver. ARE BLOOD TRANSFUSIONS COMMONLY  USED TO CORRECT ANEMIA, AND ARE THEY DANGEROUS?  They are used to treat anemia as a last resort. Your caregiver will find the cause of the anemia and correct it if possible. Most blood transfusions are given because of excessive bleeding at the time of surgery, with trauma, or because of bone marrow suppression in patients with cancer or leukemia on chemotherapy. Blood transfusions are safer than ever before. We also know that blood transfusions affect the immune system and may increase certain risks. There is also a concern for human error. In 1/16,000 transfusions, a patient receives a transfusion of blood that is not matched with his or her blood type.  WHAT IS IRON DEFICIENCY ANEMIA AND CAN I CORRECT IT BY CHANGING MY DIET?  Iron is an essential part of hemoglobin. Without enough hemoglobin, anemia develops and the body does not get the right amount of oxygen. Iron deficiency anemia develops after the body has had a low level of iron for a long time. This is either caused by blood loss, not taking in or absorbing enough iron, or increased demands for iron (like pregnancy or rapid growth).  Foods from animal origin such as beef, chicken, and pork, are good sources of iron. Be sure to have one of these foods at each meal. Vitamin C helps your body absorb iron. Foods rich in Vitamin C include citrus, bell pepper, strawberries, spinach and cantaloupe. In some cases, iron supplements may be needed in order to correct the iron deficiency. In the case of poor absorption, extra iron may have to be given directly into the vein through a needle (intravenously). I HAVE BEEN DIAGNOSED WITH IRON DEFICIENCY ANEMIA AND MY CAREGIVER PRESCRIBED IRON SUPPLEMENTS. HOW LONG WILL IT TAKE FOR MY BLOOD TO BECOME NORMAL?  It depends on the degree of anemia at the beginning of treatment. Most people with mild to moderate iron deficiency, anemia will correct the anemia over a period of 2 to 3 months. But after the anemia is  corrected, the iron stored by the body is still low. Caregivers often suggest an additional 6 months of oral iron therapy once the anemia has been reversed. This will help prevent the iron deficiency anemia from quickly happening again. Non-anemic adult males should take iron supplements only under the direction of a doctor, too much iron can cause liver damage.  MY HEMOGLOBIN IS 9 G/DL AND I AM SCHEDULED FOR SURGERY. SHOULD I POSTPONE THE SURGERY?  If you have Hgb of 9, you should discuss this with your caregiver right away. Many patients with similar hemoglobin levels have had surgery without  problems. If minimal blood loss is expected for a minor procedure, no treatment may be necessary.  If a greater blood loss is expected for more extensive procedures, you should ask your caregiver about being treated with erythropoietin and iron. This is to accelerate the recovery of your hemoglobin to a normal level before surgery. An anemic patient who undergoes high-blood-loss surgery has a greater risk of surgical complications and need for a blood transfusion, which also carries some risk.  I HAVE BEEN TOLD THAT HEAVY MENSTRUAL PERIODS CAUSE ANEMIA. IS THERE ANYTHING I CAN DO TO PREVENT THE ANEMIA?  Anemia that results from heavy periods is usually due to iron deficiency. You can try to meet the increased demands for iron caused by the heavy monthly blood loss by increasing the intake of iron-rich foods. Iron supplements may be required. Discuss your concerns with your caregiver. WHAT CAUSES ANEMIA DURING PREGNANCY?  Pregnancy places major demands on the body. The mother must meet the needs of both her body and her growing baby. The body needs enough iron and folate to make the right amount of red blood cells. To prevent anemia while pregnant, the mother should stay in close contact with her caregiver.  Be sure to eat a diet that has foods rich in iron and folate like liver and dark green leafy vegetables. Folate  plays an important role in the normal development of a baby's spinal cord. Folate can help prevent serious disorders like spina bifida. If your diet does not provide adequate nutrients, you may want to talk with your caregiver about nutritional supplements.  WHAT IS THE RELATIONSHIP BETWEEN FIBROID TUMORS AND ANEMIA IN WOMEN?  The relationship is usually caused by the increased menstrual blood loss caused by fibroids. Good iron intake may be required to prevent iron deficiency anemia from developing.  Document Released: 02/22/2004 Document Revised: 10/08/2011 Document Reviewed: 08/08/2010 [facility] Patient Information 2014 [place], [place]. Non-Hodgkin's Lymphoma, Adult Non-Hodgkin's lymphoma is a cancer that begins in the lymphoid tissue (part of your body's defense system, which protects the body from infections, germs, and diseases). Lymphocytes (a type of white blood cells) are found in the lymphoid tissue. Non-Hodgkin's lymphoma starts in lymphocytes. There are different types of non-Hodgkin's lymphoma. Your caregiver will help you understand the seriousness of your cancer. This will be based on the type of cells affected and how fast it is growing and spreading. CAUSES  Non-Hodgkin's lymphoma is a cancer that starts in lymphocytes. The cause of non-Hodgkin's lymphoma is not known. It is thought that viruses cause certain types of non-Hodgkin's lymphoma. But this is less common. The risk of getting this cancer increases if:   Your immune system (body's defense system) is weak, especially after an organ transplant.  You are an elderly white female.  You have a diet high in fat.  You are infected with certain viruses. SYMPTOMS  Non-Hodgkin's lymphoma can occur at any age. It can cause different symptoms such as:  Swelling of the lymph nodes.  Fever.  Excessive sweating.  Itchy skin.  Tiredness.  Weight loss.  Coughing, breathing trouble, and chest pain.  Weakness and tiredness that  do not go away.  Pain, swelling. or a feeling of fullness in the abdomen. DIAGNOSIS  Your caregiver will examine you to check your general health and look for any lumps. Blood tests and biopsy (removing body tissue for testing) of the lymph node (gland) may be included. Your caregiver may suggest chest X-ray, scanning or lumbar puncture (collecting fluid  from spinal column for testing).  TREATMENT  Non-Hodgkin's lymphoma can be treated in different ways. This depends on your symptoms, the stage of your cancer when you were first diagnosed, and the speed with which it is spreading. You and your caregiver will work together and decide on the best plan. Treatment may include:  Radiation therapy (using radiation to destroy the cancer cells).  Chemotherapy (using drugs to destroy the cancer cells).  Biological therapy (using body's immune system to treat cancer) like monoclonal antibody therapy (using antibodies that can kill or block the cancer cells).  Newer types of treatment are vaccine therapy and high-dose chemotherapy with stem cell (a type of cell) transplant (introducing healthy stem cells into the body). However, these are still under development. You may remain symptom-free for 1 to 2 years, after treatment. Non-Hodgkin's lymphoma may recur. If it recurs, your caregiver will advise you on the suitable treatment. If you are pregnant and also have non-Hodgkin's lymphoma, you need immediate treatment. Your caregiver will decide the treatment that suits you the most.  SEEK MEDICAL CARE IF:   You develop new symptoms of non-Hodgkin's lymphoma.  You have non-Hodgkin's lymphoma and continuous fever. Document Released: 01/27/2007 Document Revised: 10/08/2011 Document Reviewed: 01/27/2007 [facility] Patient Information 2014 [place], [place].

## 2013-04-06 NOTE — Telephone Encounter (Signed)
gv pt appt schedule for septembr. Per family f/u should be 1-2 weeks. appt scheduled for 9/22 due to orders for pet/ct/bx/echo not yet entered. Copy of 9/8 pof back to desk nurse re orders and pt family aware they will hear from [facility] re pet/ct/bx and from me re echo if one is needed. bx and echo not mentioned on pof but desk nurse made aware per family provider s/w them re these appts.

## 2013-04-07 ENCOUNTER — Other Ambulatory Visit: Payer: Self-pay | Admitting: Medical Oncology

## 2013-04-07 ENCOUNTER — Telehealth: Payer: Self-pay | Admitting: Internal Medicine

## 2013-04-07 ENCOUNTER — Telehealth: Payer: Self-pay | Admitting: Medical Oncology

## 2013-04-07 ENCOUNTER — Encounter: Payer: Self-pay | Admitting: Internal Medicine

## 2013-04-07 DIAGNOSIS — C8590 Non-Hodgkin lymphoma, unspecified, unspecified site: Secondary | ICD-10-CM

## 2013-04-07 LAB — HOLD TUBE, BLOOD BANK

## 2013-04-07 NOTE — Telephone Encounter (Signed)
Add to previous note. [facility] will call pt w/bx appt.

## 2013-04-07 NOTE — Telephone Encounter (Signed)
Added appts for IVF 9/10 and lb 9/12. Also changed time of 9/22 lb/fu to 1:30pm and moved to CP1 - appt was scheduled w/CP2 in error. S/w pt husband he is aware and will get new schedule tomorrow. Echo order to linda for preauth. Per desk nurse IVF's to be 9/10 not 9/17. Message via vm to chemo schedule re adding IVF' for tomorrow I added appt before realizing 9/10 was tomorrow.

## 2013-04-07 NOTE — Telephone Encounter (Signed)
Spoke to husband and informed him of lab result significant for elevated calcium level.  Informed his of symptoms including confusion, gi problems such as constipation, nausea, etc.  He understood.  I advised him of aggressive fluid hydration.  He also mentioned that she took tums.  I told him to have her stop ingesting any oral calcium containing agents.  Repeat labs on Friday including BMP.

## 2013-04-07 NOTE — Telephone Encounter (Signed)
I called pt's husband to inform him that Dr. Rosie Fate has ordered IV fluids for his wife. Due to her elevated calcium and she is nauseated he feels she would benefit from IV fluids. He will recheck her labs on Friday and I stressed they might want to wait on the results incase she would need more fluids. He is aware that his wife will be called about the bone marrow biopsy and an echo. I explained we will print him a new schedule tomorrow when she comes for fluids. I asked him to have his infusion nurse go over the calender if case he has any questions. He voiced understanding.

## 2013-04-08 ENCOUNTER — Other Ambulatory Visit: Payer: Self-pay | Admitting: Medical Oncology

## 2013-04-08 ENCOUNTER — Ambulatory Visit (HOSPITAL_BASED_OUTPATIENT_CLINIC_OR_DEPARTMENT_OTHER): Payer: Medicare Other

## 2013-04-08 ENCOUNTER — Telehealth: Payer: Self-pay | Admitting: Internal Medicine

## 2013-04-08 VITALS — BP 121/60 | HR 96 | Temp 100.3°F

## 2013-04-08 DIAGNOSIS — C8590 Non-Hodgkin lymphoma, unspecified, unspecified site: Secondary | ICD-10-CM

## 2013-04-08 DIAGNOSIS — E46 Unspecified protein-calorie malnutrition: Secondary | ICD-10-CM

## 2013-04-08 MED ORDER — SODIUM CHLORIDE 0.9 % IV SOLN
Freq: Once | INTRAVENOUS | Status: AC
  Administered 2013-04-08: 14:00:00 via INTRAVENOUS

## 2013-04-08 NOTE — Progress Notes (Signed)
Patient History and Physical   Ronnald Nian, MD [address] [place], [place] [address]  JONITA HIROTA [id number] February 15, 1941 72 y.o. 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  HPI:  KEHAULANI FRUIN is a pleasant 72 y.o Caucasian female who was sent by her PCP to ED because of anemia (Hgb 6.4) on August 29th, 2014.  She reports that she started to lost her appetite and started to lose weight about 2 months ago. She became increasingly weak and fatigued. In addition, she also developed cough with green sputum and dyspnea. Patient also noticed new headaches, diminished hearing. She also complains fevers (persistent) and nightsweats that at times drenched her bedsheets.  She reported nasal congestion and white nasal discharge, feeling some lump in throat.  On admission, she also complained of  abdominal pain, episodes of nausea, low back pain.. Patient reported history of colonoscopy and EGD several years ago. She received 3 units of RBC. GI consult was done. Patient was admitted and transfused a total of 3 units of PRBC. GI was consulted and an EGD done on 8/31 did not show any foci for bleeding. CT scan of the abdomen and chest, showed significant lymphadenopathy, suspicious of lymphoma.  Hematology/oncology was also consulted and recommended core needle biopsy.  Surgery performed a biopsy of R inguinal lymph node revealed Diffuse large B cell lymphoma.  For her fevers, she was empirically placed on intravenous rocephin which was discontinued with negative blood culture.  Her acute renal failure likely secondary to prerenal azotemia improved with intravenous hydration.  In addition, she had a mild hypercalcemia for multiple myeloma was pursued and for to be negative.   She determined to her moderate protein calorie malnutrition  due her underlying illnesses.   Today, she presents as follow-up to discuss the results of her recent biopsy.  She reports continued anorexia, nightsweats and fevers and general malaise.     PMH: Past Medical History  Diagnosis Date  . Hypertension   . Renal insufficiency   . Osteopenia   . Allergy   . Heart murmur   . GERD (gastroesophageal reflux disease)   . Anemia   . Hypercalcemia 03/27/2013  . Non-Hodgkins lymphoma 04/06/2013  . Non-Hodgkins lymphoma 04/06/2013    Past Surgical History  Procedure Laterality Date  . Appendectomy    . Colonoscopy  2008  . Spine surgery    . Back surgery      LOWER BACK TWICE  . Abdominal hysterectomy    . Esophagogastroduodenoscopy N/A 03/29/2013    Procedure: ESOPHAGOGASTRODUODENOSCOPY (EGD);  Surgeon: Charna Elizabeth, MD;  Location[facility] ENDOSCOPY;  Service: Endoscopy;  Laterality: N/A;  . Inguinal lymph node biopsy Right 03/31/2013    Procedure: INGUINAL LYMPH NODE BIOPSY;  Surgeon: Cherylynn Ridges, MD;  Location: [facility] OR;  Service: General;  Laterality: Right;    Allergies: Allergies  Allergen Reactions  . Penicillins Rash    Medications: Current outpatient prescriptions:acetaminophen (TYLENOL) 325 MG tablet, Take 2 tablets (650 mg total) by mouth every 6 (six) hours as needed., Disp: , Rfl: ;  aspirin 81 MG tablet, Take 1 tablet (81 mg total) by mouth daily., Disp: 30 tablet, Rfl: ;  bisoprolol (ZEBETA) 10 MG tablet, Take 1 tablet (10 mg total) by mouth daily., Disp: 30 tablet, Rfl: 0 omeprazole (PRILOSEC) 40 MG capsule, Take 1 capsule (40  mg total) by mouth daily., Disp: 30 capsule, Rfl: 11;  oxyCODONE (OXY IR/ROXICODONE) 5 MG immediate release tablet, Take 1 tablet (5 mg total) by mouth every 6 (six) hours as needed., Disp: 30 tablet, Rfl: 0   Social History:   reports that she quit smoking about 52 years ago. Her smoking use included Cigarettes. She has a 1 pack-year smoking history. She has never used smokeless tobacco. She reports that  drinks  alcohol. She reports that she does not use illicit drugs.  Family History: Family History  Problem Relation Age of Onset  . Heart attack Father 7  . Leukemia Mother   . Diabetes Brother   . Diabetes Sister     Review of Systems: Constitutional ROS: Fever ++, Chills, Night Sweats ++, Anorexia ++, Pain 4 of 10 Cardiovascular ROS: no chest pain or dyspnea on exertion Respiratory ROS: no cough, shortness of breath, or wheezing Neurological ROS: no TIA or stroke symptoms negative for - confusion or impaired coordination/balance Dermatological ROS: negative for rash ENT ROS: negative for - epistaxis or tinnitus Gastrointestinal ROS: positive for - abdominal pain negative for - melena or swallowing difficulty/pain Genito-Urinary ROS: no dysuria, trouble voiding, or hematuria Hematological and Lymphatic ROS: negative for - bleeding problems or blood clots Breast ROS: negative for breast lumps Musculoskeletal ROS: negative for - gait disturbance Remaining ROS negative.  Physical Exam: Blood pressure 123/62, pulse 116, temperature 99 F (37.2 C), temperature source Oral, resp. rate 17, height 5\' 2"  (1.575 m), weight 155 lb 9.6 oz (70.58 kg), SpO2 96.00%. ECOG: 2 General appearance: alert, cooperative, appears stated age, fatigued, mild distress and mildly obese Head: Normocephalic, without obvious abnormality, atraumatic Neck: no adenopathy, supple, symmetrical, trachea midline and thyroid not enlarged, symmetric, no tenderness/mass/nodules Lymph nodes: Cervical adenopathy: None appreciated, Axillary adenopathy: Chain of palpable adenopathy, firm, Supraclavicular adenopathy: None appreciated and Inguinal adenopathy: S/p R inguinal lymph node biopsy HEENT:  PERRLA; EOMi; OP clear of lesions Heart:S1, S2 normal and tachy Lung:chest clear, no wheezing, rales, normal symmetric air entry, Heart exam - S1, S2 normal, no murmur, no gallop, rate regular Abdomin: soft, distended, tenderness mild  in the lower abdomen, without guarding, without rebound and splenomegaly noted EXT:No periperal edema Neuro: Non-focal; AAOx3.  Skin: No rashes.   Lab Results: Lab Results  Component Value Date   WBC 4.6 04/06/2013   HGB 9.5* 04/06/2013   HCT 29.8* 04/06/2013   MCV 75.2* 04/06/2013   PLT 207 04/06/2013     Chemistry      Component Value Date/Time   NA 134* 04/06/2013 1610   NA 134* 03/31/2013 0600   K 3.9 04/06/2013 1610   K 3.7 03/31/2013 0600   CL 99 03/31/2013 0600   CO2 28 04/06/2013 1610   CO2 26 03/31/2013 0600   BUN 10.8 04/06/2013 1610   BUN 10 03/31/2013 0600   CREATININE 0.9 04/06/2013 1610   CREATININE 1.06 03/31/2013 0600   CREATININE 1.37* 03/26/2013 1338      Component Value Date/Time   CALCIUM 10.5* 04/06/2013 1610   CALCIUM 9.8 03/31/2013 0600   ALKPHOS 205* 04/06/2013 1610   ALKPHOS 120* 03/31/2013 0600   AST 61* 04/06/2013 1610   AST 24 03/31/2013 0600   ALT 14 04/06/2013 1610   ALT 7 03/31/2013 0600   BILITOT 1.65* 04/06/2013 1610   BILITOT 0.6 03/31/2013 0600      Radiological Studies: Dg Chest 2 View  03/27/2013 *RADIOLOGY REPORT* Clinical Data: Shortness of breath CHEST - 2  VIEW Comparison: 03/12/13 Findings: The heart and pulmonary vascularity are within normal limits. The lungs are clear bilaterally. No acute bony abnormality is seen. IMPRESSION: No acute abnormality noted. Original Report Authenticated By: Alcide Clever, M.D.  Dg Chest 2 View  03/12/2013 *RADIOLOGY REPORT* Clinical Data: Cough CHEST - 2 VIEW Comparison: 03/09/2011 Findings: Cardiomediastinal silhouette is stable. No acute infiltrate or pleural effusion. Central mild bronchitic changes. No pulmonary edema. Partially visualized postsurgical changes lower lumbar spine. IMPRESSION: Central mild bronchitic changes. No acute infiltrate or pulmonary edema. Original Report Authenticated By: Natasha Mead, M.D.  Ct Chest W Contrast  03/30/2013 CLINICAL DATA: 72 year old female with fever of unknown origin, anemia, blood loss of unknown etiology,  possible malignancy, renal insufficiency, hypertension. EXAM: CT CHEST, ABDOMEN, AND PELVIS WITH CONTRAST TECHNIQUE: Multidetector CT imaging of the chest, abdomen and pelvis was performed following the standard protocol during bolus administration of intravenous contrast. CONTRAST: OMNIPAQUE IOHEXOL 300 MG/ML SOLN COMPARISON: Chest radiographs 03/28/2013 and earlier. Abdominal radiographs 11/25/2004. FINDINGS: CT CHEST FINDINGS Major airways are patent. No pericardial or pleural effusion. In the posterior right upper lobe there is mild reticular nodular Peribronchovascular opacity which appears to be inflammatory (series 3, image 17). Otherwise, the lungs are clear. No acute or suspicious osseous abnormality identified in the chest. Incidental breast implants. Negative visualized thoracic inlet. However, there is hilar and mediastinal lymphadenopathy. Right peritracheal node measures up to 15 mm short axis. Hilar nodes measure up to 20 mm individually right greater than left. Bulky subcarinal lymphadenopathy, measuring up to 22 mm. There is also a right greater than left Mild axillary lymphadenopathy, with individual nodes measuring up tip 20 mm short axis. No breast soft tissue mass identified. Major mediastinal vascular structures remain patent. CT ABDOMEN AND PELVIS FINDINGS Splenomegaly with permeative/miliary hypodensity scattered in the splenic parenchyma. Bulky right greater than left retroperitoneal lymphadenopathy, with AE right aortocaval nodal conglomeration measuring up to 36 mm short axis. Less pronounced splenic hilum, gastrohepatic ligament, and porta hepatis lymphadenopathy. Retroperitoneal lymphadenopathy continues into the pelvis. Left iliac bifurcation node measures 23 mm short axis. Right inguinal rounded lymph node measures 24 mm short axis. Smaller round inguinal lymph nodes on the left. Abdominal mesenteric nodes also appear enlarged measuring up to 12 mm short axis. Trace pelvic free  fluid. No abdominal free fluid. Oral contrast has reached the rectum. Uterus and adnexal within normal limits. Unremarkable bladder. Redundant left colon. Negative right colon and appendix. No dilated small bowel. Decompressed stomach. Negative pancreas, adrenal glands, kidneys, and gallbladder. Low density throughout the liver suggesting steatosis. Major arterial structures in the abdomen and pelvis are patent. Portal venous system within normal limits. Sequelae of L3-L4 and L4-L5 fusion. 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 No acute or suspicious osseous lesion in the abdomen or pelvis. IMPRESSION: CT CHEST IMPRESSION 1. Mediastinal, hilar, and axillary lymphadenopathy, in conjunction with the abdomen and pelvis findings, most compatible with non-Hodgkin's lymphoma. 2. Trace abnormal lung opacity in the posterior right upper lobe may reflect mild acute infection or chronic postinflammatory change. CT ABDOMEN AND PELVIS IMPRESSION 1. 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 2. Abdominal and pelvic lymphadenopathy maximal in the retroperitoneum and right inguinal nodal stations. 3. 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 4. Hepatic steatosis. Electronically Signed By: Augusto Gamble On:  03/30/2013 20:31  US Abdomen Complete  03/30/2013 *RADIOLOGY REPORT* Clinical Data: Elevated liver function tests COMPLETE ABDOMINAL ULTRASOUND Comparison: None. Findings: Gallbladder: Well distended with evidence of gallbladder sludge. No definitive stones are seen. Wall thickening is noted. Common bile duct: 5.1 mm. This is within normal limits. Liver: A focal area of increased echogenicity is identified adjacent to the gallbladder fossa likely related to focal  fatty infiltration or a small hemangioma. IVC: Appears normal. Pancreas: No focal abnormality seen. Spleen: 13.4 cm. Diffusely heterogeneous and mildly enlarged. Right Kidney: 10.7 cm. No mass lesion or hydronephrosis is noted. Left Kidney: 11 cm without mass lesion or hydronephrosis. Abdominal aorta: No aneurysm identified. Adjacent to the aorta near the origin of the superior mesenteric artery there is a 4.5 by 2.5 cm hypoechoic area which may represent a lymph node. IMPRESSION: Gallbladder sludge. Hyper echoic lesion within the liver of uncertain significance. Splenomegaly with diffuse heterogeneity. Hypoechoic area adjacent to the proximal aorta of uncertain significance. CT of the abdomen and pelvis with contrast may be helpful for further evaluation. Original Report Authenticated By: Alcide Clever, M.D.  Ct Abdomen Pelvis W Contrast  03/30/2013 CLINICAL DATA: 72 year old female with fever of unknown origin, anemia, blood loss of unknown etiology, possible malignancy, renal insufficiency, hypertension. EXAM: CT CHEST, ABDOMEN, AND PELVIS WITH CONTRAST TECHNIQUE: Multidetector CT imaging of the chest, abdomen and pelvis was performed following the standard protocol during bolus administration of intravenous contrast. CONTRAST: OMNIPAQUE IOHEXOL 300 MG/ML SOLN COMPARISON: Chest radiographs 03/28/2013 and earlier. Abdominal radiographs 11/25/2004. FINDINGS: CT CHEST FINDINGS Major airways are patent. No pericardial or pleural effusion. In the posterior right upper lobe there is mild reticular nodular Peribronchovascular opacity which appears to be inflammatory (series 3, image 17). Otherwise, the lungs are clear. No acute or suspicious osseous abnormality identified in the chest. Incidental breast implants. Negative visualized thoracic inlet. However, there is hilar and mediastinal lymphadenopathy. Right peritracheal node measures up to 15 mm short axis. Hilar nodes measure up to 20 mm individually right  greater than left. Bulky subcarinal lymphadenopathy, measuring up to 22 mm. There is also a right greater than left Mild axillary lymphadenopathy, with individual nodes measuring up tip 20 mm short axis. No breast soft tissue mass identified. Major mediastinal vascular structures remain patent. CT ABDOMEN AND PELVIS FINDINGS Splenomegaly with permeative/miliary hypodensity scattered in the splenic parenchyma. Bulky right greater than left retroperitoneal lymphadenopathy, with AE right aortocaval nodal conglomeration measuring up to 36 mm short axis. Less pronounced splenic hilum, gastrohepatic ligament, and porta hepatis lymphadenopathy. Retroperitoneal lymphadenopathy continues into the pelvis. Left iliac bifurcation node measures 23 mm short axis. Right inguinal rounded lymph node measures 24 mm short axis. Smaller round inguinal lymph nodes on the left. Abdominal mesenteric nodes also appear enlarged measuring up to 12 mm short axis. Trace pelvic free fluid. No abdominal free fluid. Oral contrast has reached the rectum. Uterus and adnexal within normal limits. Unremarkable bladder. Redundant left colon. Negative right colon and appendix. No dilated small bowel. Decompressed stomach. Negative pancreas, adrenal glands, kidneys, and gallbladder. Low density throughout the liver suggesting steatosis. Major arterial structures in the abdomen and pelvis are patent. Portal venous system within normal limits. Sequelae of L3-L4 and L4-L5 fusion. 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 No acute or suspicious osseous lesion in the abdomen or pelvis. IMPRESSION: CT CHEST IMPRESSION 1. Mediastinal, hilar, and axillary lymphadenopathy, in conjunction with the  abdomen and pelvis findings, most compatible with non-Hodgkin's lymphoma. 2. Trace abnormal lung opacity in the posterior right upper lobe may reflect mild acute  infection or chronic postinflammatory change. CT ABDOMEN AND PELVIS IMPRESSION 1. 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 2. Abdominal and pelvic lymphadenopathy maximal in the retroperitoneum and right inguinal nodal stations. 3. 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 4. Hepatic steatosis. Electronically Signed By: Augusto Gamble On: 03/30/2013 20:31  Dg Chest Port 1 View  03/28/2013 CLINICAL DATA: Fever EXAM: PORTABLE CHEST - 1 VIEW COMPARISON: 03/27/2013 FINDINGS: Decreasing lung volumes without focal airspace opacity. Heart is normal size. No effusions. No acute bony abnormality. IMPRESSION: No active disease. Electronically Signed By: Charlett Nose On: 03/28/2013 22:21   Pathology (03/31/2013): Lymph node for lymphoma, Right, Inguinal - DIFFUSE LARGE B CELL LYMPHOMA - SEE ONCOLOGY TABLE. Microscopic Comment LYMPHOMA Histologic type: Non-Hodgkin's lymphoma, diffuse large cell type. Grade (if applicable): High grade. Flow cytometry: A minor monoclonal B cell population with lambda light chain restriction. ([id number]). Immunohistochemical stains: BCL-2, BCL-6, CD10, CD138, CD20, CD3, CD30, LCA, CD43, and CD79a with appropriate controls. Touch preps/imprints: Abundance of large lymphoid cells with prominent nucleoli admixed with small lymphoid cells. Comments: The sections show effacement of the lymph nodal architecture by a diffuse relatively monomorphicpopulation of large lymphoid cells with vesicular chromatin, prominent nucleoli and eosinophilic to amphophilic cytoplasm. This is associated with brisk mitoses and areas of tumor necrosis. The appearance is mostly diffuse with lack of follicular of nodular structures. Flow cytometric analysis was performed ([id number]) and shows a minor population of monoclonal B cells displaying pan B cell antigens  including CD20. Immunohistochemical stains were performed and show that the large atypical lymphoid cells are positive for LCA, CD79a, CD20, BCL-2 and BCL-6. There is partial positivity for CD30. No appreciable positivity is seen with CD10 or CD138. CD3 and CD43 highlight the admixed T cell component present in the background. The overall histologic and immunophenotypic features are consistent with diffuse large B cell lymphoma. Specimen Gross and Clinical Information Specimen(s) Obtained: Lymph node for lymphoma, Right, Inguinal 1 of 2 FINAL for BELLAGRACE, SYLVAN ([id number]) Specimen Clinical Information Right Inguinal Adenopathy (jmc) Gross Received fresh is a 3.4 x 2.8 x 2.5 cm encapsulated, rubbery, ovoid nodule. There is a separate, focally disrupted 2.5 x 2.2 x 1.2 cm nodular portion of tissue. The cut surfaces are solid, tan-white with focal slight hemorrhage. A portion of the specimen is placed in RPMI for flow cytometry and touch preparations are made from the cut surfaces. Sections are submitted in three cassettes. (GRP:ecj 03/31/2013) Stain(s) used in Diagnosis: The following stain(s) were used in diagnosing the case: BCl 6 , CD 79a, CD 30, BCl 2, CD 138, CD 3, CD 43, CD45 (LCA), CD-10, CD 20. The control(s) stained appropriately.  Impression and Plan: 1. DLBCL, high grade.  Clinical stage III w B symptoms (fevers and nightsweats).   Her performance score is borderline currently with malnutrion.  Her International Prognostic index is 4  (NEJM, 1993) with a 3-year survival rate of 59% based on an elevated LDH, age greater than 13 years old, performance status of 2, Stage III or IV.  She has extranodal involvement in the spleen but we will need to assess if DLBCL is within the bone marrow.   -- We provided her an overview of lymphoma in discussed further workup based on  NCCN Guidelines Version 4.2014 which includes the following:      A.  Laboratory including CBC, LDH, CMP, Uric acid      B.  CT of C/A/P (done and as noted above on recent admission)     C. Hepatitis B testing (because of the risk of reactivation with immunotherapy and chemotherapy.  She denies history hepatitis b or risk factors.     D. PET-CT scan     E. Bone marrow biopsy with aspirate to be sent for flow cytometry and cytogenetics     F. MUGA scan or echocardiogram based on possible use of anthracycline  -- We briefly discussed first-line treatment options is clinical trial enrollment or induction therapy R-CHOP q 21 days for 6-8 cycles consisting of rituximab, cyclophosphamide, doxorubicin, vincristine, prednisone) (Category 1).   She was provided a brief overview and should we pursue therapy, we will provide a detail risk/benefit discussion and consent for chemotherapy.   --We will consider prophylaxis for tumor lysis syndrome with allopurinol.   --CNS prophylaxis will be considered with systemic methotrexate (3-3.5 g/m^2) or intrathecal methotrexate and/or cytarabine for 4- 8 doses if she has bone marrow with large cell lymphoma or > than 2 extranodal sites. An increase of CNS events has been noted.   2. Hypercalcemia.   Patient was called and informed of her recent laboratory result revealing an elevated calcium of 10.5 not corrected for low albumin.  She was instructed to return for intravenous fluid hydration and repeat chemistries.    3. Poor nutrititon.  - Referral to nutrition to further optimize her caloric intake.   4. RTC as soon as possible once further work-up complete to discuss treatment options.  Patient provided handouts about disease and instructed to call us should she have any questions.  She voiced understanding.   Rorey Hodges, MD 04/06/2013  7:05pm

## 2013-04-08 NOTE — Telephone Encounter (Signed)
S/w pt husband re echo appt for 9/12 @ 11am @ [facility] and also time change for 9/12 lb to 12pm. Husband will get new schedule when pt comes in today.

## 2013-04-08 NOTE — Patient Instructions (Addendum)
Dehydration, Adult Dehydration is when you lose more fluids from the body than you take in. Vital organs like the kidneys, brain, and heart cannot function without a proper amount of fluids and salt. Any loss of fluids from the body can cause dehydration.  CAUSES   Vomiting.  Diarrhea.  Excessive sweating.  Excessive urine output.  Fever. SYMPTOMS  Mild dehydration  Thirst.  Dry lips.  Slightly dry mouth. Moderate dehydration  Very dry mouth.  Sunken eyes.  Skin does not bounce back quickly when lightly pinched and released.  Dark urine and decreased urine production.  Decreased tear production.  Headache. Severe dehydration  Very dry mouth.  Extreme thirst.  Rapid, weak pulse (more than 100 beats per minute at rest).  Cold hands and feet.  Not able to sweat in spite of heat and temperature.  Rapid breathing.  Blue lips.  Confusion and lethargy.  Difficulty being awakened.  Minimal urine production.  No tears. DIAGNOSIS  Your caregiver will diagnose dehydration based on your symptoms and your exam. Blood and urine tests will help confirm the diagnosis. The diagnostic evaluation should also identify the cause of dehydration. TREATMENT  Treatment of mild or moderate dehydration can often be done at home by increasing the amount of fluids that you drink. It is best to drink small amounts of fluid more often. Drinking too much at one time can make vomiting worse. Refer to the home care instructions below. Severe dehydration needs to be treated at the hospital where you will probably be given intravenous (IV) fluids that contain water and electrolytes. HOME CARE INSTRUCTIONS   Ask your caregiver about specific rehydration instructions.  Drink enough fluids to keep your urine clear or pale yellow.  Drink small amounts frequently if you have nausea and vomiting.  Eat as you normally do.  Avoid:  Foods or drinks high in sugar.  Carbonated  drinks.  Juice.  Extremely hot or cold fluids.  Drinks with caffeine.  Fatty, greasy foods.  Alcohol.  Tobacco.  Overeating.  Gelatin desserts.  Wash your hands well to avoid spreading bacteria and viruses.  Only take over-the-counter or prescription medicines for pain, discomfort, or fever as directed by your caregiver.  Ask your caregiver if you should continue all prescribed and over-the-counter medicines.  Keep all follow-up appointments with your caregiver. SEEK MEDICAL CARE IF:  You have abdominal pain and it increases or stays in one area (localizes).  You have a rash, stiff neck, or severe headache.  You are irritable, sleepy, or difficult to awaken.  You are weak, dizzy, or extremely thirsty. SEEK IMMEDIATE MEDICAL CARE IF:   You are unable to keep fluids down or you get worse despite treatment.  You have frequent episodes of vomiting or diarrhea.  You have blood or green matter (bile) in your vomit.  You have blood in your stool or your stool looks black and tarry.  You have not urinated in 6 to 8 hours, or you have only urinated a small amount of very dark urine.  You have a fever.  You faint. MAKE SURE YOU:   Understand these instructions.  Will watch your condition.  Will get help right away if you are not doing well or get worse. Document Released: 07/16/2005 Document Revised: 10/08/2011 Document Reviewed: 03/05/2011 ExitCare Patient Information 2014 [employer].  

## 2013-04-09 ENCOUNTER — Telehealth: Payer: Self-pay | Admitting: *Deleted

## 2013-04-09 ENCOUNTER — Other Ambulatory Visit: Payer: Self-pay | Admitting: Radiology

## 2013-04-09 ENCOUNTER — Other Ambulatory Visit: Payer: Self-pay | Admitting: Medical Oncology

## 2013-04-09 DIAGNOSIS — I129 Hypertensive chronic kidney disease with stage 1 through stage 4 chronic kidney disease, or unspecified chronic kidney disease: Secondary | ICD-10-CM | POA: Diagnosis not present

## 2013-04-09 DIAGNOSIS — Z48817 Encounter for surgical aftercare following surgery on the skin and subcutaneous tissue: Secondary | ICD-10-CM | POA: Diagnosis not present

## 2013-04-09 DIAGNOSIS — D649 Anemia, unspecified: Secondary | ICD-10-CM | POA: Diagnosis not present

## 2013-04-09 DIAGNOSIS — R599 Enlarged lymph nodes, unspecified: Secondary | ICD-10-CM | POA: Diagnosis not present

## 2013-04-09 DIAGNOSIS — C8590 Non-Hodgkin lymphoma, unspecified, unspecified site: Secondary | ICD-10-CM

## 2013-04-09 NOTE — Telephone Encounter (Signed)
Per staff message and POF I have scheduled appts.  JMW  

## 2013-04-10 ENCOUNTER — Other Ambulatory Visit: Payer: Self-pay | Admitting: Internal Medicine

## 2013-04-10 ENCOUNTER — Other Ambulatory Visit (HOSPITAL_BASED_OUTPATIENT_CLINIC_OR_DEPARTMENT_OTHER): Payer: Medicare Other | Admitting: Lab

## 2013-04-10 ENCOUNTER — Inpatient Hospital Stay (HOSPITAL_COMMUNITY): Payer: Medicare Other

## 2013-04-10 ENCOUNTER — Inpatient Hospital Stay (HOSPITAL_COMMUNITY)
Admission: AD | Admit: 2013-04-10 | Discharge: 2013-04-24 | DRG: 823 | Disposition: A | Discharge status: Home Health | Payer: Medicare Other | Source: Ambulatory Visit | Attending: Internal Medicine | Admitting: Internal Medicine

## 2013-04-10 ENCOUNTER — Ambulatory Visit: Payer: Medicare Other

## 2013-04-10 ENCOUNTER — Ambulatory Visit (HOSPITAL_COMMUNITY)
Admission: RE | Admit: 2013-04-10 | Discharge: 2013-04-10 | Disposition: A | Discharge status: Home / Self Care | Payer: Medicare Other | Source: Ambulatory Visit | Attending: Internal Medicine | Admitting: Internal Medicine

## 2013-04-10 ENCOUNTER — Other Ambulatory Visit: Payer: Self-pay | Admitting: *Deleted

## 2013-04-10 ENCOUNTER — Encounter (HOSPITAL_COMMUNITY): Payer: Self-pay | Admitting: Internal Medicine

## 2013-04-10 DIAGNOSIS — K7689 Other specified diseases of liver: Secondary | ICD-10-CM | POA: Diagnosis present

## 2013-04-10 DIAGNOSIS — I369 Nonrheumatic tricuspid valve disorder, unspecified: Secondary | ICD-10-CM

## 2013-04-10 DIAGNOSIS — I519 Heart disease, unspecified: Secondary | ICD-10-CM | POA: Diagnosis present

## 2013-04-10 DIAGNOSIS — R5381 Other malaise: Secondary | ICD-10-CM

## 2013-04-10 DIAGNOSIS — E43 Unspecified severe protein-calorie malnutrition: Secondary | ICD-10-CM | POA: Diagnosis present

## 2013-04-10 DIAGNOSIS — C8590 Non-Hodgkin lymphoma, unspecified, unspecified site: Secondary | ICD-10-CM

## 2013-04-10 DIAGNOSIS — E119 Type 2 diabetes mellitus without complications: Secondary | ICD-10-CM | POA: Diagnosis present

## 2013-04-10 DIAGNOSIS — D630 Anemia in neoplastic disease: Secondary | ICD-10-CM | POA: Diagnosis present

## 2013-04-10 DIAGNOSIS — I129 Hypertensive chronic kidney disease with stage 1 through stage 4 chronic kidney disease, or unspecified chronic kidney disease: Secondary | ICD-10-CM | POA: Diagnosis not present

## 2013-04-10 DIAGNOSIS — D509 Iron deficiency anemia, unspecified: Secondary | ICD-10-CM | POA: Diagnosis present

## 2013-04-10 DIAGNOSIS — D638 Anemia in other chronic diseases classified elsewhere: Secondary | ICD-10-CM

## 2013-04-10 DIAGNOSIS — R509 Fever, unspecified: Secondary | ICD-10-CM | POA: Diagnosis present

## 2013-04-10 DIAGNOSIS — R7989 Other specified abnormal findings of blood chemistry: Secondary | ICD-10-CM

## 2013-04-10 DIAGNOSIS — N179 Acute kidney failure, unspecified: Secondary | ICD-10-CM | POA: Diagnosis not present

## 2013-04-10 DIAGNOSIS — D696 Thrombocytopenia, unspecified: Secondary | ICD-10-CM | POA: Diagnosis not present

## 2013-04-10 DIAGNOSIS — I10 Essential (primary) hypertension: Secondary | ICD-10-CM | POA: Diagnosis present

## 2013-04-10 DIAGNOSIS — R6889 Other general symptoms and signs: Secondary | ICD-10-CM | POA: Diagnosis present

## 2013-04-10 DIAGNOSIS — R5383 Other fatigue: Secondary | ICD-10-CM | POA: Diagnosis not present

## 2013-04-10 DIAGNOSIS — K208 Other esophagitis without bleeding: Secondary | ICD-10-CM | POA: Diagnosis present

## 2013-04-10 DIAGNOSIS — Z87891 Personal history of nicotine dependence: Secondary | ICD-10-CM | POA: Diagnosis not present

## 2013-04-10 DIAGNOSIS — R609 Edema, unspecified: Secondary | ICD-10-CM | POA: Diagnosis present

## 2013-04-10 DIAGNOSIS — D649 Anemia, unspecified: Secondary | ICD-10-CM | POA: Diagnosis not present

## 2013-04-10 DIAGNOSIS — R739 Hyperglycemia, unspecified: Secondary | ICD-10-CM | POA: Diagnosis not present

## 2013-04-10 DIAGNOSIS — R630 Anorexia: Secondary | ICD-10-CM | POA: Diagnosis not present

## 2013-04-10 DIAGNOSIS — E876 Hypokalemia: Secondary | ICD-10-CM | POA: Diagnosis not present

## 2013-04-10 DIAGNOSIS — C801 Malignant (primary) neoplasm, unspecified: Secondary | ICD-10-CM | POA: Diagnosis not present

## 2013-04-10 DIAGNOSIS — K5900 Constipation, unspecified: Secondary | ICD-10-CM | POA: Diagnosis present

## 2013-04-10 DIAGNOSIS — G9341 Metabolic encephalopathy: Secondary | ICD-10-CM | POA: Diagnosis not present

## 2013-04-10 DIAGNOSIS — E860 Dehydration: Secondary | ICD-10-CM | POA: Diagnosis present

## 2013-04-10 DIAGNOSIS — K760 Fatty (change of) liver, not elsewhere classified: Secondary | ICD-10-CM | POA: Diagnosis present

## 2013-04-10 DIAGNOSIS — R42 Dizziness and giddiness: Secondary | ICD-10-CM | POA: Diagnosis not present

## 2013-04-10 DIAGNOSIS — R64 Cachexia: Secondary | ICD-10-CM | POA: Diagnosis not present

## 2013-04-10 DIAGNOSIS — C8589 Other specified types of non-Hodgkin lymphoma, extranodal and solid organ sites: Principal | ICD-10-CM | POA: Diagnosis present

## 2013-04-10 DIAGNOSIS — B009 Herpesviral infection, unspecified: Secondary | ICD-10-CM | POA: Diagnosis present

## 2013-04-10 DIAGNOSIS — Z6832 Body mass index (BMI) 32.0-32.9, adult: Secondary | ICD-10-CM

## 2013-04-10 DIAGNOSIS — B370 Candidal stomatitis: Secondary | ICD-10-CM

## 2013-04-10 DIAGNOSIS — C8595 Non-Hodgkin lymphoma, unspecified, lymph nodes of inguinal region and lower limb: Secondary | ICD-10-CM | POA: Diagnosis not present

## 2013-04-10 DIAGNOSIS — Z66 Do not resuscitate: Secondary | ICD-10-CM | POA: Diagnosis present

## 2013-04-10 DIAGNOSIS — M899 Disorder of bone, unspecified: Secondary | ICD-10-CM | POA: Diagnosis present

## 2013-04-10 DIAGNOSIS — Z880 Allergy status to penicillin: Secondary | ICD-10-CM

## 2013-04-10 DIAGNOSIS — J189 Pneumonia, unspecified organism: Secondary | ICD-10-CM | POA: Diagnosis not present

## 2013-04-10 DIAGNOSIS — R599 Enlarged lymph nodes, unspecified: Secondary | ICD-10-CM | POA: Diagnosis not present

## 2013-04-10 DIAGNOSIS — Z48817 Encounter for surgical aftercare following surgery on the skin and subcutaneous tissue: Secondary | ICD-10-CM | POA: Diagnosis not present

## 2013-04-10 DIAGNOSIS — R05 Cough: Secondary | ICD-10-CM | POA: Diagnosis present

## 2013-04-10 HISTORY — DX: Other ill-defined heart diseases: I51.89

## 2013-04-10 LAB — COMPREHENSIVE METABOLIC PANEL (CC13)
ALT: 14 U/L (ref 0–55)
AST: 46 U/L — ABNORMAL HIGH (ref 5–34)
Creatinine: 1.4 mg/dL — ABNORMAL HIGH (ref 0.6–1.1)
Sodium: 137 mEq/L (ref 136–145)
Total Bilirubin: 1.37 mg/dL — ABNORMAL HIGH (ref 0.20–1.20)

## 2013-04-10 LAB — CBC WITH DIFFERENTIAL/PLATELET
BASO%: 0.2 % (ref 0.0–2.0)
EOS%: 0 % (ref 0.0–7.0)
HCT: 29.7 % — ABNORMAL LOW (ref 34.8–46.6)
LYMPH%: 8.7 % — ABNORMAL LOW (ref 14.0–49.7)
MCH: 23.5 pg — ABNORMAL LOW (ref 25.1–34.0)
MCHC: 30.3 g/dL — ABNORMAL LOW (ref 31.5–36.0)
MCV: 77.5 fL — ABNORMAL LOW (ref 79.5–101.0)
MONO%: 12.8 % (ref 0.0–14.0)
NEUT%: 78.3 % — ABNORMAL HIGH (ref 38.4–76.8)
Platelets: 214 10*3/uL (ref 145–400)

## 2013-04-10 LAB — CBC
HCT: 29.5 % — ABNORMAL LOW (ref 36.0–46.0)
MCH: 23.3 pg — ABNORMAL LOW (ref 26.0–34.0)
MCV: 78 fL (ref 78.0–100.0)
Platelets: 175 10*3/uL (ref 150–400)
RBC: 3.78 MIL/uL — ABNORMAL LOW (ref 3.87–5.11)
RDW: 17.9 % — ABNORMAL HIGH (ref 11.5–15.5)
WBC: 5.4 10*3/uL (ref 4.0–10.5)

## 2013-04-10 LAB — URIC ACID: Uric Acid, Serum: 5.5 mg/dL (ref 2.4–7.0)

## 2013-04-10 LAB — CREATININE, SERUM: GFR calc Af Amer: 46 mL/min — ABNORMAL LOW (ref >90)

## 2013-04-10 MED ORDER — ACETAMINOPHEN 325 MG PO TABS
650.0000 mg | ORAL_TABLET | Freq: Four times a day (QID) | ORAL | Status: DC | PRN
  Administered 2013-04-10 – 2013-04-22 (×9): 650 mg via ORAL
  Filled 2013-04-10 (×10): qty 2

## 2013-04-10 MED ORDER — PNEUMOCOCCAL VAC POLYVALENT 25 MCG/0.5ML IJ INJ
0.5000 mL | INJECTION | INTRAMUSCULAR | Status: AC
  Administered 2013-04-11: 0.5 mL via INTRAMUSCULAR
  Filled 2013-04-10 (×2): qty 0.5

## 2013-04-10 MED ORDER — ALUM & MAG HYDROXIDE-SIMETH 200-200-20 MG/5ML PO SUSP: 30.0000 mL | Freq: Four times a day (QID) | ORAL | Status: DC | PRN

## 2013-04-10 MED ORDER — SODIUM CHLORIDE 0.9 % IV SOLN
Freq: Once | INTRAVENOUS | Status: AC
  Administered 2013-04-10: 333 mL/h via INTRAVENOUS

## 2013-04-10 MED ORDER — POLYETHYLENE GLYCOL 3350 17 G PO PACK
17.0000 g | PACK | Freq: Every day | ORAL | Status: DC | PRN
  Filled 2013-04-10 (×2): qty 1

## 2013-04-10 MED ORDER — ACETAMINOPHEN 650 MG RE SUPP
650.0000 mg | Freq: Four times a day (QID) | RECTAL | Status: DC | PRN
  Administered 2013-04-14 – 2013-04-15 (×3): 650 mg via RECTAL
  Filled 2013-04-10 (×3): qty 1

## 2013-04-10 MED ORDER — GUAIFENESIN-DM 100-10 MG/5ML PO SYRP
5.0000 mL | ORAL_SOLUTION | ORAL | Status: DC | PRN
Start: 2013-04-10 — End: 2013-04-19
  Administered 2013-04-18: 5 mL via ORAL
  Filled 2013-04-10: qty 10

## 2013-04-10 MED ORDER — ENOXAPARIN SODIUM 40 MG/0.4ML ~~LOC~~ SOLN
40.0000 mg | SUBCUTANEOUS | Status: DC
  Administered 2013-04-10 – 2013-04-15 (×6): 40 mg via SUBCUTANEOUS
  Filled 2013-04-10 (×7): qty 0.4

## 2013-04-10 MED ORDER — ALLOPURINOL 300 MG PO TABS: 300.0000 mg | ORAL_TABLET | Freq: Two times a day (BID) | ORAL | Status: DC

## 2013-04-10 MED ORDER — SODIUM CHLORIDE 0.9 % IV SOLN: INTRAVENOUS | Status: DC

## 2013-04-10 MED ORDER — INFLUENZA VAC SPLIT QUAD 0.5 ML IM SUSP
0.5000 mL | INTRAMUSCULAR | Status: AC
  Administered 2013-04-11: 0.5 mL via INTRAMUSCULAR
  Filled 2013-04-10 (×2): qty 0.5

## 2013-04-10 MED ORDER — PANTOPRAZOLE SODIUM 40 MG PO TBEC
40.0000 mg | DELAYED_RELEASE_TABLET | Freq: Every day | ORAL | Status: DC
  Administered 2013-04-10 – 2013-04-24 (×15): 40 mg via ORAL
  Filled 2013-04-10 (×15): qty 1

## 2013-04-10 MED ORDER — BISOPROLOL FUMARATE 10 MG PO TABS
10.0000 mg | ORAL_TABLET | Freq: Every day | ORAL | Status: DC
  Administered 2013-04-10 – 2013-04-24 (×15): 10 mg via ORAL
  Filled 2013-04-10 (×15): qty 1

## 2013-04-10 MED ORDER — ASPIRIN EC 81 MG PO TBEC
81.0000 mg | DELAYED_RELEASE_TABLET | Freq: Every day | ORAL | Status: DC
  Administered 2013-04-10 – 2013-04-24 (×15): 81 mg via ORAL
  Filled 2013-04-10 (×15): qty 1

## 2013-04-10 MED ORDER — PREDNISONE 50 MG PO TABS
100.0000 mg | ORAL_TABLET | Freq: Every day | ORAL | Status: DC
  Administered 2013-04-11 – 2013-04-13 (×3): 100 mg via ORAL
  Filled 2013-04-10 (×5): qty 2

## 2013-04-10 MED ORDER — OXYCODONE HCL 5 MG PO TABS: 5.0000 mg | ORAL_TABLET | ORAL | Status: DC | PRN

## 2013-04-10 MED ORDER — ONDANSETRON HCL 4 MG/2ML IJ SOLN
4.0000 mg | Freq: Four times a day (QID) | INTRAMUSCULAR | Status: DC | PRN
  Administered 2013-04-15: 4 mg via INTRAVENOUS
  Filled 2013-04-10: qty 2

## 2013-04-10 MED ORDER — PREDNISONE 20 MG PO TABS: 100.0000 mg | ORAL_TABLET | Freq: Every day | ORAL | Status: DC

## 2013-04-10 MED ORDER — PREDNISONE 50 MG PO TABS: 100.0000 mg | ORAL_TABLET | Freq: Every day | ORAL | Status: DC

## 2013-04-10 MED ORDER — ALLOPURINOL 300 MG PO TABS
300.0000 mg | ORAL_TABLET | Freq: Two times a day (BID) | ORAL | Status: DC
  Administered 2013-04-10 – 2013-04-20 (×21): 300 mg via ORAL
  Filled 2013-04-10 (×25): qty 1

## 2013-04-10 MED ORDER — ONDANSETRON HCL 4 MG PO TABS: 4.0000 mg | ORAL_TABLET | Freq: Four times a day (QID) | ORAL | Status: DC | PRN

## 2013-04-10 MED ORDER — SODIUM CHLORIDE 0.9 % IV SOLN
INTRAVENOUS | Status: DC
  Administered 2013-04-10 – 2013-04-14 (×6): via INTRAVENOUS
  Administered 2013-04-16: 10 mL/h via INTRAVENOUS
  Administered 2013-04-19 – 2013-04-21 (×2): via INTRAVENOUS

## 2013-04-10 NOTE — Progress Notes (Signed)
Pt arrived to treatment area via wheelchair with husband. Per RN assessment husband and pt state ongoing night fevers as high as 101 with increased night sweats ( last night pt had to change clothes 2x ). Today pt is very fatiqued.  She states also decreased appetite but is attempting to drink water.  This note will be given to MD for review for any additional interventions.

## 2013-04-10 NOTE — Progress Notes (Signed)
*  PRELIMINARY RESULTS* Echocardiogram 2D Echocardiogram has been performed.  Tonya Wise 04/10/2013, 1:22 PM

## 2013-04-10 NOTE — Progress Notes (Signed)
Patient transferred via wheelchair to room 1444. Report given to [place], [place]

## 2013-04-10 NOTE — Progress Notes (Signed)
Brief Pharmacy Note for Renal Adjustment of Allopurinol  A/P: Currently estimated CrCl based on SCr of 1.4 = 32 ml/min. No renal adjustment necessary at this point. Continue current dosing of 300mg  BID. Will continue to monitor for necessary changes   Hessie Knows, PharmD, BCPS Pager [phone] 04/10/2013 5:07 PM

## 2013-04-10 NOTE — H&P (Signed)
[facility] History and Physical  TENECIA IGNASIAK ZOX:[id number] DOB: Sep 12, 1940 DOA: 04/10/2013  Referring physician: Dr. Rosie Fate PCP: [name], MD   Chief Complaint: Fever   History of Present Illness: Tonya Wise is an 72 y.o. female with a PMH of newly diagnosed NHL during recent hospitalization 03/27/13-03/31/13 where she had originally presented for evaluation of generalized weakness and anemia.  She ultimately underwent right inguinal lymph node biopsy confirming the diagnosis. Since discharge, she has had some intermittent fatigue and fevers/night sweats. She saw Dr. Rosie Fate for an office visit on 04/06/2013 for treatment planning. Routine lab work, done during that visit, showed that she was hypercalcemic, and the patient was instructed to return to the clinic today for IV fluids and repeat blood work. Followup labs showed ongoing hypercalcemia along with acute renal failure, and she was referred as a direct admission for further treatment.  Review of Systems: Constitutional: + fever, + chills;  Appetite poor; + weight loss of around 20 lbs over the past few months, no weight gain, + fatigue.  HEENT: No blurry vision, no diplopia, no pharyngitis, no dysphagia CV: No chest pain, no palpitations.  Resp: No SOB, + dry cough. GI: No nausea, no vomiting, no diarrhea, no melena, no hematochezia.  GU: No dysuria, no hematuria. MSK: no myalgias, no arthralgias.  Neuro:  No headache, no focal neurological deficits, no history of seizures.  Psych: No depression, no anxiety.  Endo: No heat intolerance, no cold intolerance, no polyuria, + polydipsia  Skin: No rashes, no skin lesions.  Heme: No easy bruising.  Past Medical History Past Medical History  Diagnosis Date  . Hypertension   . Renal insufficiency   . Osteopenia   . Allergy   . Heart murmur   . GERD (gastroesophageal reflux disease)   . Anemia   . Hypercalcemia 03/27/2013  . Non-Hodgkins lymphoma 04/06/2013     Past  Surgical History Past Surgical History  Procedure Laterality Date  . Appendectomy    . Colonoscopy  2008  . Spine surgery    . Back surgery      LOWER BACK TWICE  . Abdominal hysterectomy    . Esophagogastroduodenoscopy N/A 03/29/2013    Procedure: ESOPHAGOGASTRODUODENOSCOPY (EGD);  Surgeon: Charna Elizabeth, MD;  [facility] ENDOSCOPY;  Service: Endoscopy;  Laterality: N/A;  . Inguinal lymph node biopsy Right 03/31/2013    Procedure: INGUINAL LYMPH NODE BIOPSY;  Surgeon: Cherylynn Ridges, MD;  Location: [facility] OR;  Service: General;  Laterality: Right;     Social History: History   Social History  . Marital Status: Married    Spouse Name: [name]    Number of Children: 2  . Years of Education: 12   Occupational History  .  [employer]   Social History Main Topics  . Smoking status: Former Smoker -- 0.50 packs/day for 2 years    Types: Cigarettes    Quit date: 07/30/1960  . Smokeless tobacco: Never Used  . Alcohol Use: Yes     Comment: rare-wine drinker  . Drug Use: No  . Sexual Activity: Yes    Partners: Male   Other Topics Concern  . Not on file   Social History Narrative   Married.  Lives in [place] with husband.  Ambulates with a walker when able.    Family History:  Family History  Problem Relation Age of Onset  . Heart attack Father 65  . Leukemia Mother   . Diabetes Brother   . Diabetes Sister  Allergies: Penicillins  Meds: Prior to Admission medications   Medication Sig Start Date End Date Taking? Authorizing Provider  acetaminophen (TYLENOL) 325 MG tablet Take 2 tablets (650 mg total) by mouth every 6 (six) hours as needed. 04/01/13   Christiane Ha, MD  allopurinol (ZYLOPRIM) 300 MG tablet Take 1 tablet (300 mg total) by mouth 2 (two) times daily. 04/10/13   Myra Rude, MD  allopurinol (ZYLOPRIM) 300 MG tablet Take 300 mg by mouth 2 (two) times daily. 04/10/13   Myra Rude, MD  aspirin 81 MG tablet Take 1 tablet (81 mg total) by mouth daily. 03/31/13    Shanker Levora Dredge, MD  bisoprolol (ZEBETA) 10 MG tablet Take 1 tablet (10 mg total) by mouth daily. 03/31/13   Shanker Levora Dredge, MD  omeprazole (PRILOSEC) 40 MG capsule Take 1 capsule (40 mg total) by mouth daily. 03/31/13   Shanker Levora Dredge, MD  oxyCODONE (OXY IR/ROXICODONE) 5 MG immediate release tablet Take 1 tablet (5 mg total) by mouth every 6 (six) hours as needed. 03/31/13   Shanker Levora Dredge, MD    Physical Exam: Filed Vitals:   04/10/13 1608  BP: 144/76  Pulse: 104  Temp: 98.3 F (36.8 C)  TempSrc: Oral  Resp: 18  SpO2: 99%     Physical Exam: Blood pressure 144/76, pulse 104, temperature 98.3 F (36.8 C), temperature source Oral, resp. rate 18, SpO2 99.00%. Gen: No acute distress. + Rigors. Head: Normocephalic, atraumatic. Eyes: PERRL, EOMI, sclerae nonicteric. Mouth: Oropharynx clear.  No mucositis.  No thrush. Neck: Supple, no thyromegaly, no lymphadenopathy, no jugular venous distention. Chest: Lungs clear to auscultation bilaterally. CV: Heart sounds are tachycardic. No murmurs, rubs, or gallops. Abdomen: Soft, nontender, nondistended with normal active bowel sounds. Extremities: Extremities Skin: Warm and dry. Neuro: Lethargic, oriented times 3; cranial nerves II through XII grossly intact. Psych: Mood and affect flat.  Labs on Admission:  Basic Metabolic Panel:  Recent Labs Lab 04/06/13 1610 04/10/13 1205  NA 134* 137  K 3.9 4.1  CO2 28 29  GLUCOSE 117 157*  BUN 10.8 17.0  CREATININE 0.9 1.4*  CALCIUM 10.5* 12.3*   Liver Function Tests:  Recent Labs Lab 04/06/13 1610 04/10/13 1205  AST 61* 46*  ALT 14 14  ALKPHOS 205* 223*  BILITOT 1.65* 1.37*  PROT 6.0* 6.1*  ALBUMIN 1.7* 1.7*   CBC:  Recent Labs Lab 04/06/13 1610 04/10/13 1205  WBC 4.6 6.6  NEUTROABS 3.4 5.2  HGB 9.5* 9.0*  HCT 29.8* 29.7*  MCV 75.2* 77.5*  PLT 207 214    Radiological Exams on Admission: No results found.  Assessment/Plan Principal Problem:    Hypercalcemia related to malignancy, acute -Admit for IVF.  Re-check in a.m.  Consider bisphosphonate if still elevated. Active Problems:   Chronic cough -Will check a 2 view CXR. -Anti-tussives as needed.   Microcytic anemia, chronic -S/P normal EGD on 03/29/13.  BX negative for celiac sprue. -Likely anemia of chronic disease related to malignancy.   Acute renal failure -Secondary to dehydration. -Hydrate and monitor.  Baseline creatinine 0.9   Non-Hodgkins lymphoma with fever/rigors, newly diagnosed -Case discussed with Dr. Rosie Fate.  Will start prednisone in a.m. -High risk for tumor lysis.  Check uric acid/magnesium levels for baseline. -Rigors/fever likely a manifestation of lymphoma.  Check U/A, CXR rule out occult infection.   Elevated LFTs / hepatic steatosis -Hepatic steatosis noted on CT abdomen/pelvis done 03/30/13.  Code Status: DNR Family Communication: [name] (husband) [phone] (cell), [phone] (  home). Disposition Plan: Home when stable.  Time spent: 70 minutes.  Mayur Duman [facility] Pager [phone]  If 7PM-7AM, please contact night-coverage [email or web address] Password [facility] 04/10/2013, 4:28 PM

## 2013-04-10 NOTE — Consult Note (Signed)
Referring MD:  Maryruth Bun. Rama  PCP:  [name], MD   Reason for Referral: Hypercalcemia, DLBCL  Patient is a pleasant 72 y.o Caucasian female with newly diagnosed diffuse large B cell lymphoma (see last clinic note on 04/06/2013 regarding detailed lymphoma workup). In clinic earlier this week, she was found to have elevated calcium level.   We encouraged oral hydration and rechecked today with increase in her calcium to 12.3 corrected for her albumin to be 14.   I referred her for admission given her increasing calcium, mild AKI likely secondary to dehyration and and symptoms of constipation and general malaise. She reports continued anorexia, nightsweats and fevers and general malaise.     Past Medical History  Diagnosis Date  . Hypertension   . Renal insufficiency   . Osteopenia   . Allergy   . Heart murmur   . GERD (gastroesophageal reflux disease)   . Anemia   . Hypercalcemia 03/27/2013  . Non-Hodgkins lymphoma 04/06/2013  :  Past Surgical History  Procedure Laterality Date  . Appendectomy    . Colonoscopy  2008  . Spine surgery    . Back surgery      LOWER BACK TWICE  . Abdominal hysterectomy    . Esophagogastroduodenoscopy N/A 03/29/2013    Procedure: ESOPHAGOGASTRODUODENOSCOPY (EGD);  Surgeon: Charna Elizabeth, MD;  Location[facility] ENDOSCOPY;  Service: Endoscopy;  Laterality: N/A;  . Inguinal lymph node biopsy Right 03/31/2013    Procedure: INGUINAL LYMPH NODE BIOPSY;  Surgeon: Cherylynn Ridges, MD;  Location: [facility] OR;  Service: General;  Laterality: Right;  :  . allopurinol  300 mg Oral BID  . aspirin EC  81 mg Oral Daily  . bisoprolol  10 mg Oral Daily  . enoxaparin (LOVENOX) injection  40 mg Subcutaneous Q24H  . [START ON 04/11/2013] influenza vac split quadrivalent PF  0.5 mL Intramuscular Tomorrow-1000  . pantoprazole  40 mg Oral Daily  . [START ON 04/11/2013] pneumococcal 23 valent vaccine  0.5 mL Intramuscular Tomorrow-1000  . [START ON 04/11/2013] predniSONE  100  mg Oral Q breakfast  :  Allergies  Allergen Reactions  . Penicillins Rash  :  Family History  Problem Relation Age of Onset  . Heart attack Father 42  . Leukemia Mother   . Diabetes Brother   . Diabetes Sister   :  History   Social History  . Marital Status: Married    Spouse Name: [name]    Number of Children: 2  . Years of Education: 12   Occupational History  .  [employer]   Social History Main Topics  . Smoking status: Former Smoker -- 0.50 packs/day for 2 years    Types: Cigarettes    Quit date: 07/30/1960  . Smokeless tobacco: Never Used  . Alcohol Use: Yes     Comment: rare-wine drinker  . Drug Use: No  . Sexual Activity: Yes    Partners: Male   Other Topics Concern  . Not on file   Social History Narrative   Married.  Lives in [place] with husband.  Ambulates with a walker when able.  :  ROS: Deferred secondary to patient was asleep.   Vitals: Filed Vitals:   04/10/13 2059  BP:   Pulse:   Temp: 99 F (37.2 C)  Resp:     PHYSICAL EXAM: General appearance:  Laying in bed asleep. NAD  Labs:  Recent Labs  04/10/13 1205 04/10/13 1644  WBC 6.6 5.4  HGB 9.0* 8.8*  HCT 29.7* 29.5*  PLT 214 175    Recent Labs  04/10/13 1205 04/10/13 1644  NA 137  --   K 4.1  --   CO2 29  --   GLUCOSE 157*  --   BUN 17.0  --   CREATININE 1.4* 1.32*  CALCIUM 12.3*  --     Images Studies/Results:  X-ray Chest Pa And Lateral   04/10/2013   CLINICAL DATA:  72 year old female with cough and weakness. Lymphoma.  EXAM: CHEST  2 VIEW  COMPARISON:  Chest CT 03/30/2013 and earlier.  FINDINGS: Stable prominent right hilum. See recent CT report. Other mediastinal contours are within normal limits. Visualized tracheal air column is within normal limits. No pneumothorax, pulmonary edema, pleural effusion or confluent pulmonary opacity. No acute osseous abnormality identified.  IMPRESSION: No new cardiopulmonary abnormality. Hilar lymphadenopathy, see  03/30/2013 chest CT.   Electronically Signed   By: Augusto Gamble M.D.   On: 04/10/2013 17:51     Pathology: DLBCL confirmed by inguinal biopsy on 04/10/2013   Assessment: Principal Problem:   Hypercalcemia Active Problems:   Chronic cough   Microcytic anemia   Acute renal failure   Non-Hodgkins lymphoma   Elevated LFTs   Hepatic steatosis   Rigors   Fever, unspecified  Recommendation: 1. Please obtain daily am laboratory including CBC, LDH, CMP, Uric acid to follow tumor lysis panel. Start renal-based allopurinol 300 mg bid (renally dosed) for tumor lysis prevention.  2. Start 100 mg PO prednisone in am (Part of R-CHOP therapy for her DLBCL).  Will need to continue to complete a total of  5 days.  3. IVF NS at 125 cc/ hour. Hold bisphonates given prednisone will also address elevated calcium. 4. F/u infectious w/u.   Fevers likely secondary to lymphoma.  5. Nutrition consult.    Hays Dunnigan 04/10/2013, 10:25 PM

## 2013-04-11 LAB — URINALYSIS, ROUTINE W REFLEX MICROSCOPIC
Glucose, UA: NEGATIVE mg/dL
Leukocytes, UA: NEGATIVE
Nitrite: NEGATIVE
Specific Gravity, Urine: 1.019 (ref 1.005–1.030)
pH: 6 (ref 5.0–8.0)

## 2013-04-11 LAB — BASIC METABOLIC PANEL
BUN: 17 mg/dL (ref 6–23)
CO2: 27 mEq/L (ref 19–32)
Chloride: 102 mEq/L (ref 96–112)
Creatinine, Ser: 1.27 mg/dL — ABNORMAL HIGH (ref 0.50–1.10)
GFR calc Af Amer: 48 mL/min — ABNORMAL LOW (ref >90)
Potassium: 3.6 mEq/L (ref 3.5–5.1)

## 2013-04-11 NOTE — Progress Notes (Signed)
[facility] PROGRESS NOTE  Tonya Wise ZOX:[id number] DOB: Sep 19, 1940 DOA: 04/10/2013 PCP: [name], MD  Brief narrative: Tonya Wise is an 72 y.o. female with a PMH of recently diagnosed NHL who was admitted on 04/10/2013 with dehydration, acute renal failure, hypercalcemia and rigors.  Assessment/Plan: Principal Problem:  Hypercalcemia related to malignancy, acute  -Admitted for IVF. Calcium level coming down with conservative therapy. Active Problems:  Chronic cough  -2 view CXR negative for pneumonia.  -Anti-tussives as needed.  Microcytic anemia, chronic  -S/P normal EGD on 03/29/13. BX negative for celiac sprue.  -Likely anemia of chronic disease related to malignancy.  Acute renal failure  -Secondary to dehydration.  -Hydrate and monitor. Baseline creatinine 0.9  Non-Hodgkins lymphoma with fever/rigors, newly diagnosed  -Case discussed with Dr. Rosie Fate. Prednisone to start this morning. On allopurinol.  -High risk for tumor lysis. Check uric acid/magnesium levels regularly. Not elevated at baseline. -Rigors/fever likely a manifestation of lymphoma. No evidence of UTI based on urinalysis or pneumonia based on 2 view chest film.  Elevated LFTs / hepatic steatosis  -Hepatic steatosis noted on CT abdomen/pelvis done 03/30/13.  Code Status: DNR  Family Communication: [name] (husband) [phone] (cell), [phone] (home). Updated at bedside. Disposition Plan: Home when stable.  Medical Consultants:  Dr. Myra Rude, Oncology  Other Consultants:  Dietitian  Anti-infectives:  None.  HPI/Subjective: Tonya Wise continues to have rigors and chills. No complaints of dyspnea, nausea or vomiting. Bowels are moving.  Objective: Filed Vitals:   04/10/13 1926 04/10/13 2059 04/11/13 0213 04/11/13 0519  BP: 128/62  132/90 138/74  Pulse: 106  102 107  Temp: 103 F (39.4 C) 99 F (37.2 C) 98.4 F (36.9 C) 99.1 F (37.3 C)  TempSrc: Oral  Oral Oral  Resp: 16   19 18   Height:      Weight:      SpO2: 93%  97% 96%    Intake/Output Summary (Last 24 hours) at 04/11/13 1032 Last data filed at 04/11/13 0707  Gross per 24 hour  Intake 1414.58 ml  Output    500 ml  Net 914.58 ml    Exam: Gen:  NAD Cardiovascular:  Tachycardic Respiratory:  Lungs CTAB Gastrointestinal:  Abdomen soft, NT/ND, + BS Extremities:  No C/E/C  Data Reviewed: Basic Metabolic Panel:  Recent Labs Lab 04/06/13 1610 04/10/13 1205 04/10/13 1644 04/11/13 0841  NA 134* 137  --  136  K 3.9 4.1  --  3.6  CL  --   --   --  102  CO2 28 29  --  27  GLUCOSE 117 157*  --  155*  BUN 10.8 17.0  --  17  CREATININE 0.9 1.4* 1.32* 1.27*  CALCIUM 10.5* 12.3*  --  10.7*  MG  --   --  1.8  --    GFR Estimated Creatinine Clearance: 37.3 ml/min (by C-G formula based on Cr of 1.27). Liver Function Tests:  Recent Labs Lab 04/06/13 1610 04/10/13 1205  AST 61* 46*  ALT 14 14  ALKPHOS 205* 223*  BILITOT 1.65* 1.37*  PROT 6.0* 6.1*  ALBUMIN 1.7* 1.7*   CBC:  Recent Labs Lab 04/06/13 1610 04/10/13 1205 04/10/13 1644  WBC 4.6 6.6 5.4  NEUTROABS 3.4 5.2  --   HGB 9.5* 9.0* 8.8*  HCT 29.8* 29.7* 29.5*  MCV 75.2* 77.5* 78.0  PLT 207 214 175   Microbiology No results found for this or any previous visit (from the past 240 hour(s)).  Procedures and Diagnostic Studies:  X-ray Chest Pa And Lateral  04/10/2013   CLINICAL DATA:  72 year old female with cough and weakness. Lymphoma.  EXAM: CHEST  2 VIEW  COMPARISON:  Chest CT 03/30/2013 and earlier.  FINDINGS: Stable prominent right hilum. See recent CT report. Other mediastinal contours are within normal limits. Visualized tracheal air column is within normal limits. No pneumothorax, pulmonary edema, pleural effusion or confluent pulmonary opacity. No acute osseous abnormality identified.  IMPRESSION: No new cardiopulmonary abnormality. Hilar lymphadenopathy, see 03/30/2013 chest CT.   Electronically Signed   By: Augusto Gamble  M.D.   On: 04/10/2013 17:51    2-D echocardiogram 04/10/2013: Study conclusions: EF 50-55%. Doppler parameters consistent with abnormal left ventricular relaxation current disease grade 1 diastolic dysfunction)  Scheduled Meds: . allopurinol  300 mg Oral BID  . aspirin EC  81 mg Oral Daily  . bisoprolol  10 mg Oral Daily  . enoxaparin (LOVENOX) injection  40 mg Subcutaneous Q24H  . pantoprazole  40 mg Oral Daily  . predniSONE  100 mg Oral Q breakfast   Continuous Infusions: . sodium chloride 125 mL/hr at 04/11/13 0527    Time spent: 35 minutes with > 50% of time discussing current diagnostic test results, clinical impression and plan of care.    LOS: 1 day   Destyn Schuyler  [facility] Pager [phone].   *Please note that the hospitalists switch teams on Wednesdays. Please call the flow manager at [phone] if you are having difficulty reaching the hospitalist taking care of this patient as she can update you and provide the most up-to-date pager number of provider caring for the patient. If 8PM-8AM, please contact night-coverage at [email or web address], password [facility]  04/11/2013, 10:32 AM

## 2013-04-12 ENCOUNTER — Encounter (HOSPITAL_COMMUNITY): Payer: Self-pay | Admitting: Internal Medicine

## 2013-04-12 DIAGNOSIS — I5189 Other ill-defined heart diseases: Secondary | ICD-10-CM

## 2013-04-12 HISTORY — DX: Other ill-defined heart diseases: I51.89

## 2013-04-12 LAB — CBC
HCT: 25.9 % — ABNORMAL LOW (ref 36.0–46.0)
MCH: 24.3 pg — ABNORMAL LOW (ref 26.0–34.0)
MCV: 77.5 fL — ABNORMAL LOW (ref 78.0–100.0)
RBC: 3.34 MIL/uL — ABNORMAL LOW (ref 3.87–5.11)
WBC: 6.6 10*3/uL (ref 4.0–10.5)

## 2013-04-12 LAB — BASIC METABOLIC PANEL
CO2: 24 mEq/L (ref 19–32)
Calcium: 11.2 mg/dL — ABNORMAL HIGH (ref 8.4–10.5)
Chloride: 100 mEq/L (ref 96–112)
Glucose, Bld: 145 mg/dL — ABNORMAL HIGH (ref 70–99)
Sodium: 134 mEq/L — ABNORMAL LOW (ref 135–145)

## 2013-04-12 MED ORDER — PAMIDRONATE DISODIUM 90 MG/10ML IV SOLN
90.0000 mg | Freq: Once | INTRAVENOUS | Status: AC
  Administered 2013-04-12: 90 mg via INTRAVENOUS
  Filled 2013-04-12: qty 10

## 2013-04-12 MED ORDER — FUROSEMIDE 10 MG/ML IJ SOLN
20.0000 mg | Freq: Once | INTRAMUSCULAR | Status: AC
  Administered 2013-04-12: 20 mg via INTRAVENOUS
  Filled 2013-04-12: qty 2

## 2013-04-12 MED ORDER — ENSURE COMPLETE PO LIQD
237.0000 mL | Freq: Two times a day (BID) | ORAL | Status: DC
  Administered 2013-04-12 – 2013-04-15 (×4): 237 mL via ORAL

## 2013-04-12 MED ORDER — ENSURE PUDDING PO PUDG
1.0000 | Freq: Every morning | ORAL | Status: DC
  Administered 2013-04-12 – 2013-04-15 (×3): 1 via ORAL
  Filled 2013-04-12 (×4): qty 1

## 2013-04-12 NOTE — Progress Notes (Signed)
INITIAL NUTRITION ASSESSMENT  DOCUMENTATION CODES Per approved criteria  -Not Applicable   INTERVENTION: - Ensure Complete po BID, each supplement provides 350 kcal and 13 grams of protein. - Ensure Pudding po once per day, each supplement provides 170 kcal and 4 grams of protein.  - Encourage small, frequent, high-calorie, high-protein foods   NUTRITION DIAGNOSIS: Inadequate oral intake related to NH lymphoma as evidenced by reported weight loss and intake less than estimated needs.   Goal: Pt to meet >/= 90% of their estimated nutrition needs   Monitor:  Weight, po intake, labs, acceptance of supplements  Reason for Assessment: MST  72 y.o. female  Admitting Dx: Hypercalcemia  ASSESSMENT: Pt with PMH of recently diagnosed NHL admitted on 04/10/13 with dehydration, acute renal failure, hypercalcemia and rigors. Pt's husband was in room and reported that pt's appetite comes and goes. He said that she had experiences weight loss of unknown amount, possibly around 20 lbs.  He reports that she has tried various nutritional supplements and juices at home to increase calories, but that her taste has become altered.  Discussed with pt ways to add calories and protein to meals and to encourage small, frequent meals.  Height: Ht Readings from Last 1 Encounters:  04/10/13 5\' 2"  (1.575 m)    Weight: Wt Readings from Last 1 Encounters:  04/10/13 155 lb (70.308 kg)    Ideal Body Weight: 50.1 kg  % Ideal Body Weight: 140%  Wt Readings from Last 10 Encounters:  04/10/13 155 lb (70.308 kg)  04/06/13 155 lb 9.6 oz (70.58 kg)  04/01/13 162 lb 7.7 oz (73.7 kg)  04/01/13 162 lb 7.7 oz (73.7 kg)  04/01/13 162 lb 7.7 oz (73.7 kg)  03/26/13 157 lb (71.215 kg)  03/12/13 166 lb 3.2 oz (75.388 kg)  02/23/13 166 lb (75.297 kg)  12/31/12 171 lb (77.565 kg)  11/25/12 175 lb (79.379 kg)    Usual Body Weight: unkown, weight has been trending down since 11/25/12  % Usual Body Weight:  unknown  BMI:  Body mass index is 28.34 kg/(m^2).  Estimated Nutritional Needs: Kcal: 1800-2000 Protein: 85-95 g Fluid: >2.0 L  Skin: incision on right groin 03/31/13  Diet Order: General  EDUCATION NEEDS: -Education not appropriate at this time   Intake/Output Summary (Last 24 hours) at 04/12/13 1013 Last data filed at 04/12/13 0555  Gross per 24 hour  Intake   2997 ml  Output      0 ml  Net   2997 ml    Last BM: none recorded   Labs:   Recent Labs Lab 04/10/13 1205 04/10/13 1644 04/11/13 0841 04/12/13 0403  NA 137  --  136 134*  K 4.1  --  3.6 4.0  CL  --   --  102 100  CO2 29  --  27 24  BUN 17.0  --  17 22  CREATININE 1.4* 1.32* 1.27* 1.31*  CALCIUM 12.3*  --  10.7* 11.2*  MG  --  1.8  --  2.0  GLUCOSE 157*  --  155* 145*    CBG (last 3)  No results found for this basename: GLUCAP,  in the last 72 hours  Scheduled Meds: . allopurinol  300 mg Oral BID  . aspirin EC  81 mg Oral Daily  . bisoprolol  10 mg Oral Daily  . enoxaparin (LOVENOX) injection  40 mg Subcutaneous Q24H  . furosemide  20 mg Intravenous Once  . pamidronate  90 mg Intravenous Once  .  pantoprazole  40 mg Oral Daily  . predniSONE  100 mg Oral Q breakfast    Continuous Infusions: . sodium chloride 125 mL/hr at 04/12/13 1610    Past Medical History  Diagnosis Date  . Hypertension   . Renal insufficiency   . Osteopenia   . Allergy   . Heart murmur   . GERD (gastroesophageal reflux disease)   . Anemia   . Hypercalcemia 03/27/2013  . Non-Hodgkins lymphoma 04/06/2013  . Diastolic dysfunction, grade I 9/60/4540    Past Surgical History  Procedure Laterality Date  . Appendectomy    . Colonoscopy  2008  . Spine surgery    . Back surgery      LOWER BACK TWICE  . Abdominal hysterectomy    . Esophagogastroduodenoscopy N/A 03/29/2013    Procedure: ESOPHAGOGASTRODUODENOSCOPY (EGD);  Surgeon: Charna Elizabeth, MD;  Location: [facility] ENDOSCOPY;  Service: Endoscopy;  Laterality: N/A;  .  Inguinal lymph node biopsy Right 03/31/2013    Procedure: INGUINAL LYMPH NODE BIOPSY;  Surgeon: Cherylynn Ridges, MD;  Location: [facility] OR;  Service: General;  Laterality: Right;    Ebbie Latus RD, LDN

## 2013-04-12 NOTE — Progress Notes (Addendum)
[facility] PROGRESS NOTE  KAITYLN KALLSTROM NGE:[id number] DOB: 1940-10-20 DOA: 04/10/2013 PCP: [name], MD  Brief narrative: Tonya Wise is an 72 y.o. female with a PMH of recently diagnosed NHL who was admitted on 04/10/2013 with dehydration, acute renal failure, hypercalcemia and rigors.  Assessment/Plan: Principal Problem:  Hypercalcemia related to malignancy, acute  -Admitted for IVF. Calcium trending up after initial decline.   -Will give 20 mg lasix IV x 1. -Spoke with Dr. Truett Perna, no contraindication to Pamidronate so we'll give 90 mg x1 today. Active Problems: Grade I diastolic dysfunction -Monitor I&O closely.  Chronic cough  -2 view CXR negative for pneumonia.  -Anti-tussives as needed.  Microcytic anemia, chronic  -S/P normal EGD on 03/29/13. BX negative for celiac sprue.  -Likely anemia of chronic disease related to malignancy.  Acute renal failure  -Secondary to dehydration.  -Continue IVF.  No improvement in creatinine yet. Baseline creatinine 0.9  Non-Hodgkins lymphoma with fever/rigors, newly diagnosed  -Case discussed with Dr. Rosie Fate. Prednisone started 04/11/2013. On allopurinol.  -High risk for tumor lysis. Check uric acid/magnesium levels regularly. Not elevated at baseline. -Rigors/fever likely a manifestation of lymphoma. Improved on prednisone. -No evidence of UTI based on urinalysis or pneumonia based on 2 view chest film.  Elevated LFTs / hepatic steatosis  -Hepatic steatosis noted on CT abdomen/pelvis done 03/30/13.  Code Status: DNR  Family Communication: Tonya Wise (husband) [phone] (cell), [phone] (home). Updated at bedside. Disposition Plan: Home when stable.  Medical Consultants:  Dr. Myra Rude, Oncology  Other Consultants:  Dietitian  Anti-infectives:  None.  HPI/Subjective: Tonya Wise has not had any rigors or chills. No complaints of dyspnea, nausea or vomiting. No complaints of pain.  Family reports increased  lethargy and disorientation.  Objective: Filed Vitals:   04/11/13 0519 04/11/13 1350 04/11/13 2113 04/12/13 0540  BP: 138/74 124/69 118/70 137/75  Pulse: 107 88 100 106  Temp: 99.1 F (37.3 C) 97.7 F (36.5 C) 98 F (36.7 C) 99.5 F (37.5 C)  TempSrc: Oral Oral Oral Oral  Resp: 18 16 16 16   Height:      Weight:      SpO2: 96% 95% 94% 97%    Intake/Output Summary (Last 24 hours) at 04/12/13 0749 Last data filed at 04/12/13 0555  Gross per 24 hour  Intake   2997 ml  Output      0 ml  Net   2997 ml    Exam: Gen:  NAD Cardiovascular:  Tachycardic Respiratory:  Lungs CTAB Gastrointestinal:  Abdomen soft, NT/ND, + BS Extremities:  No C/E/C Neuro: PERRL, oriented to place, month.  Non-focal.  Data Reviewed: Basic Metabolic Panel:  Recent Labs Lab 04/06/13 1610 04/10/13 1205 04/10/13 1644 04/11/13 0841 04/12/13 0403  NA 134* 137  --  136 134*  K 3.9 4.1  --  3.6 4.0  CL  --   --   --  102 100  CO2 28 29  --  27 24  GLUCOSE 117 157*  --  155* 145*  BUN 10.8 17.0  --  17 22  CREATININE 0.9 1.4* 1.32* 1.27* 1.31*  CALCIUM 10.5* 12.3*  --  10.7* 11.2*  MG  --   --  1.8  --  2.0   GFR Estimated Creatinine Clearance: 36.2 ml/min (by C-G formula based on Cr of 1.31). Liver Function Tests:  Recent Labs Lab 04/06/13 1610 04/10/13 1205  AST 61* 46*  ALT 14 14  ALKPHOS 205* 223*  BILITOT 1.65*  1.37*  PROT 6.0* 6.1*  ALBUMIN 1.7* 1.7*   CBC:  Recent Labs Lab 04/06/13 1610 04/10/13 1205 04/10/13 1644 04/12/13 0403  WBC 4.6 6.6 5.4 6.6  NEUTROABS 3.4 5.2  --   --   HGB 9.5* 9.0* 8.8* 8.1*  HCT 29.8* 29.7* 29.5* 25.9*  MCV 75.2* 77.5* 78.0 77.5*  PLT 207 214 175 175   Microbiology No results found for this or any previous visit (from the past 240 hour(s)).   Procedures and Diagnostic Studies:  X-ray Chest Pa And Lateral  04/10/2013   CLINICAL DATA:  72 year old female with cough and weakness. Lymphoma.  EXAM: CHEST  2 VIEW  COMPARISON:  Chest CT  03/30/2013 and earlier.  FINDINGS: Stable prominent right hilum. See recent CT report. Other mediastinal contours are within normal limits. Visualized tracheal air column is within normal limits. No pneumothorax, pulmonary edema, pleural effusion or confluent pulmonary opacity. No acute osseous abnormality identified.  IMPRESSION: No new cardiopulmonary abnormality. Hilar lymphadenopathy, see 03/30/2013 chest CT.   Electronically Signed   By: Augusto Gamble M.D.   On: 04/10/2013 17:51    2-D echocardiogram 04/10/2013: Study conclusions: EF 50-55%. Doppler parameters consistent with abnormal left ventricular relaxation current disease grade 1 diastolic dysfunction)  Scheduled Meds: . allopurinol  300 mg Oral BID  . aspirin EC  81 mg Oral Daily  . bisoprolol  10 mg Oral Daily  . enoxaparin (LOVENOX) injection  40 mg Subcutaneous Q24H  . pantoprazole  40 mg Oral Daily  . predniSONE  100 mg Oral Q breakfast   Continuous Infusions: . sodium chloride 125 mL/hr at 04/12/13 0252    Time spent: 35 minutes with > 50% of time discussing current diagnostic test results, clinical impression and plan of care.    LOS: 2 days   RAMA,CHRISTINA  [facility] Pager [phone].   *Please note that the hospitalists switch teams on Wednesdays. Please call the flow manager at [phone] if you are having difficulty reaching the hospitalist taking care of this patient as she can update you and provide the most up-to-date pager number of provider caring for the patient. If 8PM-8AM, please contact night-coverage at [email or web address], password [facility]  04/12/2013, 7:49 AM

## 2013-04-13 ENCOUNTER — Telehealth (INDEPENDENT_AMBULATORY_CARE_PROVIDER_SITE_OTHER): Payer: Self-pay

## 2013-04-13 ENCOUNTER — Telehealth: Payer: Self-pay | Admitting: Dietician

## 2013-04-13 DIAGNOSIS — R630 Anorexia: Secondary | ICD-10-CM | POA: Diagnosis present

## 2013-04-13 DIAGNOSIS — E43 Unspecified severe protein-calorie malnutrition: Secondary | ICD-10-CM | POA: Diagnosis present

## 2013-04-13 DIAGNOSIS — E876 Hypokalemia: Secondary | ICD-10-CM | POA: Diagnosis not present

## 2013-04-13 LAB — BASIC METABOLIC PANEL
BUN: 21 mg/dL (ref 6–23)
CO2: 23 mEq/L (ref 19–32)
Chloride: 101 mEq/L (ref 96–112)
Glucose, Bld: 200 mg/dL — ABNORMAL HIGH (ref 70–99)
Potassium: 3.4 mEq/L — ABNORMAL LOW (ref 3.5–5.1)
Sodium: 134 mEq/L — ABNORMAL LOW (ref 135–145)

## 2013-04-13 LAB — PHOSPHORUS: Phosphorus: 2.1 mg/dL — ABNORMAL LOW (ref 2.3–4.6)

## 2013-04-13 LAB — MAGNESIUM: Magnesium: 1.7 mg/dL (ref 1.5–2.5)

## 2013-04-13 MED ORDER — BIOTENE DRY MOUTH MT LIQD
15.0000 mL | Freq: Two times a day (BID) | OROMUCOSAL | Status: DC
  Administered 2013-04-14 – 2013-04-24 (×22): 15 mL via OROMUCOSAL

## 2013-04-13 MED ORDER — POTASSIUM CHLORIDE CRYS ER 20 MEQ PO TBCR
20.0000 meq | EXTENDED_RELEASE_TABLET | Freq: Once | ORAL | Status: AC
  Administered 2013-04-13: 20 meq via ORAL
  Filled 2013-04-13: qty 1

## 2013-04-13 MED ORDER — PREDNISONE 50 MG PO TABS
100.0000 mg | ORAL_TABLET | Freq: Every day | ORAL | Status: DC
  Administered 2013-04-14: 100 mg via ORAL
  Filled 2013-04-13 (×2): qty 2

## 2013-04-13 NOTE — Telephone Encounter (Signed)
Husband called earlier asking about BMBX and PET that are scheduled for 9/17 in medical day. A voice message was left that he can mention it to Dr Darnelle Catalan and Dr Rosie Fate will be informed. The tests may be done while pt is in hospital or rescheduled after she is released depending on what the doctors deem best. Information given to Dr Rosie Fate.

## 2013-04-13 NOTE — Progress Notes (Addendum)
[facility] PROGRESS NOTE  CONTRINA ORONA ZOX:[id number] DOB: May 11, 1941 DOA: 04/10/2013 PCP: [name], MD  Brief narrative: Tonya Wise is an 72 y.o. female with a PMH of recently diagnosed NHL who was admitted on 04/10/2013 with dehydration, acute renal failure, hypercalcemia and rigors.  Assessment/Plan: Principal Problem:  Hypercalcemia related to malignancy, acute  -Admitted for IVF. Calcium trending up after initial decline. Given Pamidronate and Lasix 04/12/2013 Active Problems: Hypokalemia -Will give 20 mEq by mouth x1. Anorexia / severe protein calorie malnutrition -Dietitian consultation requested. Grade I diastolic dysfunction -Monitor I&O closely. So far only slightly positive balance, but appears to be slightly volume overloaded on exam, so we'll decrease IV fluids to 75 cc per hour. Chronic cough  -2 view CXR negative for pneumonia.  -Anti-tussives as needed.  Microcytic anemia, chronic  -S/P normal EGD on 03/29/13. BX negative for celiac sprue.  -Likely anemia of chronic disease related to malignancy.  Acute renal failure  -Secondary to dehydration.  -Continue IVF.  Creatinine stable but slightly higher than usual baseline.  Non-Hodgkins lymphoma with fever/rigors, newly diagnosed  -Case discussed with Dr. Rosie Fate. Prednisone started 04/11/2013. On allopurinol.  -High risk for tumor lysis. Check uric acid/magnesium levels regularly. Not elevated at baseline. -Rigors/fever likely a manifestation of lymphoma. Improved on prednisone. -No evidence of UTI based on urinalysis or pneumonia based on 2 view chest film.  Elevated LFTs / hepatic steatosis  -Hepatic steatosis noted on CT abdomen/pelvis done 03/30/13.  Code Status: DNR  Family Communication: [name] (husband) [phone] (cell), [phone] (home). Updated at bedside. Disposition Plan: Home when stable.  Medical Consultants:  Dr. Myra Rude, Oncology  Other  Consultants:  Dietitian  Anti-infectives:  None.  HPI/Subjective: Tonya Wise has not had any rigors or chills. No complaints of dyspnea, nausea or vomiting. No complaints of pain. She does not have any appetite.  Objective: Filed Vitals:   04/12/13 0540 04/12/13 1338 04/12/13 2150 04/13/13 0618  BP: 137/75 140/68 152/83 148/78  Pulse: 106 105 111 120  Temp: 99.5 F (37.5 C) 97.6 F (36.4 C) 100 F (37.8 C) 102 F (38.9 C)  TempSrc: Oral Oral Oral Oral  Resp: 16 16 16 18   Height:      Weight:      SpO2: 97% 94% 95% 92%    Intake/Output Summary (Last 24 hours) at 04/13/13 0745 Last data filed at 04/13/13 3086  Gross per 24 hour  Intake 3106.42 ml  Output   2700 ml  Net 406.42 ml    Exam: Gen:  NAD Cardiovascular:  Tachycardic Respiratory:  Lungs CTAB Gastrointestinal:  Abdomen soft, NT/ND, + BS Extremities:  No C/E/C  Data Reviewed: Basic Metabolic Panel:  Recent Labs Lab 04/06/13 1610 04/10/13 1205 04/10/13 1644  04/11/13 0841 04/12/13 0403 04/13/13 0354  NA 134* 137  --   --  136 134* 134*  K 3.9 4.1  --   < > 3.6 4.0 3.4*  CL  --   --   --   --  102 100 101  CO2 28 29  --   --  27 24 23   GLUCOSE 117 157*  --   --  155* 145* 200*  BUN 10.8 17.0  --   --  17 22 21   CREATININE 0.9 1.4* 1.32*  --  1.27* 1.31* 1.22*  CALCIUM 10.5* 12.3*  --   --  10.7* 11.2* 11.5*  MG  --   --  1.8  --   --  2.0 1.7  PHOS  --   --   --   --   --   --  2.1*  < > = values in this interval not displayed. GFR Estimated Creatinine Clearance: 38.9 ml/min (by C-G formula based on Cr of 1.22). Liver Function Tests:  Recent Labs Lab 04/06/13 1610 04/10/13 1205  AST 61* 46*  ALT 14 14  ALKPHOS 205* 223*  BILITOT 1.65* 1.37*  PROT 6.0* 6.1*  ALBUMIN 1.7* 1.7*   CBC:  Recent Labs Lab 04/06/13 1610 04/10/13 1205 04/10/13 1644 04/12/13 0403  WBC 4.6 6.6 5.4 6.6  NEUTROABS 3.4 5.2  --   --   HGB 9.5* 9.0* 8.8* 8.1*  HCT 29.8* 29.7* 29.5* 25.9*  MCV 75.2*  77.5* 78.0 77.5*  PLT 207 214 175 175   Microbiology Recent Results (from the past 240 hour(s))  CULTURE, BLOOD (ROUTINE X 2)     Status: None   Collection Time    04/10/13  8:05 PM      Result Value Range Status   Specimen Description BLOOD RIGHT ARM   Final   Special Requests BOTTLES DRAWN AEROBIC AND ANAEROBIC 10CC   Final   Culture  Setup Time     Final   Value: 04/11/2013 02:12     Performed at [facility]   Culture     Final   Value:        BLOOD CULTURE RECEIVED NO GROWTH TO DATE CULTURE WILL BE HELD FOR 5 DAYS BEFORE ISSUING A FINAL NEGATIVE REPORT     Performed at [facility]   Report Status PENDING   Incomplete  CULTURE, BLOOD (ROUTINE X 2)     Status: None   Collection Time    04/10/13  8:10 PM      Result Value Range Status   Specimen Description BLOOD RIGHT HAND   Final   Special Requests BOTTLES DRAWN AEROBIC AND ANAEROBIC 10CC   Final   Culture  Setup Time     Final   Value: 04/11/2013 02:12     Performed at [facility]   Culture     Final   Value:        BLOOD CULTURE RECEIVED NO GROWTH TO DATE CULTURE WILL BE HELD FOR 5 DAYS BEFORE ISSUING A FINAL NEGATIVE REPORT     Performed at [facility]   Report Status PENDING   Incomplete     Procedures and Diagnostic Studies:  X-ray Chest Pa And Lateral  04/10/2013   CLINICAL DATA:  72 year old female with cough and weakness. Lymphoma.  EXAM: CHEST  2 VIEW  COMPARISON:  Chest CT 03/30/2013 and earlier.  FINDINGS: Stable prominent right hilum. See recent CT report. Other mediastinal contours are within normal limits. Visualized tracheal air column is within normal limits. No pneumothorax, pulmonary edema, pleural effusion or confluent pulmonary opacity. No acute osseous abnormality identified.  IMPRESSION: No new cardiopulmonary abnormality. Hilar lymphadenopathy, see 03/30/2013 chest CT.   Electronically Signed   By: Augusto Gamble M.D.   On: 04/10/2013 17:51    2-D echocardiogram 04/10/2013:  Study conclusions: EF 50-55%. Doppler parameters consistent with abnormal left ventricular relaxation current disease grade 1 diastolic dysfunction)  Scheduled Meds: . allopurinol  300 mg Oral BID  . aspirin EC  81 mg Oral Daily  . bisoprolol  10 mg Oral Daily  . enoxaparin (LOVENOX) injection  40 mg Subcutaneous Q24H  . feeding supplement  237 mL Oral BID BM  .  feeding supplement  1 Container Oral q morning - 10a  . pantoprazole  40 mg Oral Daily  . predniSONE  100 mg Oral Q breakfast   Continuous Infusions: . sodium chloride 125 mL/hr at 04/13/13 0618    Time spent: 35 minutes with > 50% of time discussing current diagnostic test results, clinical impression and plan of care.    LOS: 3 days   Jama Krichbaum  [facility] Pager [phone].   *Please note that the hospitalists switch teams on Wednesdays. Please call the flow manager at [phone] if you are having difficulty reaching the hospitalist taking care of this patient as she can update you and provide the most up-to-date pager number of provider caring for the patient. If 8PM-8AM, please contact night-coverage at [email or web address], password [email or web address]  04/13/2013, 7:45 AM

## 2013-04-13 NOTE — Care Management Note (Signed)
Cm spoke with patient and spouse st the bedside concerning discharge planning. Per pt's spouse pt currently active with [facility] for RN/PT. Pt may require a PT eval prior to discharge to assess for more services. Will continue to follow.   Roxy Manns Shaley Leavens,RN,MSN [phone]

## 2013-04-13 NOTE — Consult Note (Signed)
Tonya Wise   DOB:02-03-1941   ZO#:[id number]   JWJ#:[id number]  Subjective: Patient's husband was at bedside.  He stated that she was very lethargic over night and today.  Her appetite remains decreased.    Objective:  Filed Vitals:   04/13/13 1405  BP: 153/84  Pulse: 106  Temp: 99.4 F (37.4 C)  Resp: 16    Body mass index is 28.34 kg/(m^2).  Intake/Output Summary (Last 24 hours) at 04/13/13 1735 Last data filed at 04/13/13 1544  Gross per 24 hour  Intake   3450 ml  Output   1500 ml  Net   1950 ml    Generalized swelling  Sclerae unicteric  Oropharynx clear  Lungs clear -- no rales or rhonchi  Heart regular rate and rhythm  Abdomen benign  Neuro nonfocal    Labs:  Lab Results  Component Value Date   WBC 6.6 04/12/2013   HGB 8.1* 04/12/2013   HCT 25.9* 04/12/2013   MCV 77.5* 04/12/2013   PLT 175 04/12/2013   NEUTROABS 5.2 04/10/2013      Urine Studies No results found for this basename: UACOL, UAPR, USPG, UPH, UTP, UGL, UKET, UBIL, UHGB, UNIT, UROB, ULEU, UEPI, UWBC, URBC, UBAC, CAST, CRYS, UCOM, BILUA,  in the last 72 hours  Basic Metabolic Panel:  Recent Labs Lab 04/10/13 1205 04/10/13 1644  04/11/13 0841 04/12/13 0403 04/13/13 0354  NA 137  --   --  136 134* 134*  K 4.1  --   < > 3.6 4.0 3.4*  CL  --   --   --  102 100 101  CO2 29  --   --  27 24 23   GLUCOSE 157*  --   --  155* 145* 200*  BUN 17.0  --   --  17 22 21   CREATININE 1.4* 1.32*  --  1.27* 1.31* 1.22*  CALCIUM 12.3*  --   --  10.7* 11.2* 11.5*  MG  --  1.8  --   --  2.0 1.7  PHOS  --   --   --   --   --  2.1*  < > = values in this interval not displayed. GFR Estimated Creatinine Clearance: 38.9 ml/min (by C-G formula based on Cr of 1.22). Liver Function Tests:  Recent Labs Lab 04/10/13 1205  AST 46*  ALT 14  ALKPHOS 223*  BILITOT 1.37*  PROT 6.1*  ALBUMIN 1.7*   No results found for this basename: LIPASE, AMYLASE,  in the last 168 hours No results found for this basename:  AMMONIA,  in the last 168 hours Coagulation profile No results found for this basename: INR, PROTIME,  in the last 168 hours  CBC:  Recent Labs Lab 04/10/13 1205 04/10/13 1644 04/12/13 0403  WBC 6.6 5.4 6.6  NEUTROABS 5.2  --   --   HGB 9.0* 8.8* 8.1*  HCT 29.7* 29.5* 25.9*  MCV 77.5* 78.0 77.5*  PLT 214 175 175   Microbiology Recent Results (from the past 240 hour(s))  CULTURE, BLOOD (ROUTINE X 2)     Status: None   Collection Time    04/10/13  8:05 PM      Result Value Range Status   Specimen Description BLOOD RIGHT ARM   Final   Special Requests BOTTLES DRAWN AEROBIC AND ANAEROBIC 10CC   Final   Culture  Setup Time     Final   Value: 04/11/2013 02:12     Performed at [facility]  Culture     Final   Value:        BLOOD CULTURE RECEIVED NO GROWTH TO DATE CULTURE WILL BE HELD FOR 5 DAYS BEFORE ISSUING A FINAL NEGATIVE REPORT     Performed at [facility]   Report Status PENDING   Incomplete  CULTURE, BLOOD (ROUTINE X 2)     Status: None   Collection Time    04/10/13  8:10 PM      Result Value Range Status   Specimen Description BLOOD RIGHT HAND   Final   Special Requests BOTTLES DRAWN AEROBIC AND ANAEROBIC 10CC   Final   Culture  Setup Time     Final   Value: 04/11/2013 02:12     Performed at [facility]   Culture     Final   Value:        BLOOD CULTURE RECEIVED NO GROWTH TO DATE CULTURE WILL BE HELD FOR 5 DAYS BEFORE ISSUING A FINAL NEGATIVE REPORT     Performed at [facility]   Report Status PENDING   Incomplete    Studies:  No results found.  Assessment: 72 y.o. with newly diagnosed advaced stage DLBCL, Stage IIIB (have not yet determined bone marrow involvement) admitted with progressive hypercalcemia, mild AKI.   Plan: 1) Continue steroids for lymphoma with tumor lysis prophylaxis. If delirium, adjust dose to 40 mg/m2 (to complete a total of 5 days-- started on 09/13).   2) Hypercalcemia likely related to malignancy.  Continue cautious hydration with normal saline with daily weight and in/outs.  Stool regiment.  We will follow along.      Brecken Walth, MD 04/13/2013  5:35 PM

## 2013-04-13 NOTE — Progress Notes (Addendum)
Nutrition Brief Note  Pt meets criteria for severe MALNUTRITION in the context of chronic illness as evidenced by <75% estimated energy intake for the past few months with 6.6% weight loss in the past month.  Intervention: - Biotene for dry mouth, discussed with MD - Encouraged small frequent high calorie/protein meals and snacks in addition to nutritional supplements - Will continue to monitor   Full nutrition assessment completed yesterday. Met with pt and husband this morning who report pt had not eaten anything since lunch yesterday. Pt with dry mouth and metallic taste. Provided handout and discussed strategies to help with taste changes. Encouraged pt to use plastic silverware while having metallic taste. Pt coughing during visit, per MD notes this is chronic. Discussed high protein snacks - provided pt with peanut butter and graham crackers. Pt eating graham crackers at the end of the visit.   Levon Hedger MS, RD, LDN [phone] Pager [phone] After Hours Pager

## 2013-04-13 NOTE — Telephone Encounter (Signed)
Called to inform us patient is in the hospital appointment cancelled for 04-14-13

## 2013-04-14 ENCOUNTER — Inpatient Hospital Stay (HOSPITAL_COMMUNITY): Payer: Medicare Other

## 2013-04-14 ENCOUNTER — Encounter (INDEPENDENT_AMBULATORY_CARE_PROVIDER_SITE_OTHER): Payer: Medicare Other

## 2013-04-14 DIAGNOSIS — G9341 Metabolic encephalopathy: Secondary | ICD-10-CM | POA: Diagnosis present

## 2013-04-14 DIAGNOSIS — B370 Candidal stomatitis: Secondary | ICD-10-CM | POA: Diagnosis present

## 2013-04-14 DIAGNOSIS — R5383 Other fatigue: Secondary | ICD-10-CM | POA: Diagnosis present

## 2013-04-14 LAB — URIC ACID: Uric Acid, Serum: 1.8 mg/dL — ABNORMAL LOW (ref 2.4–7.0)

## 2013-04-14 LAB — BASIC METABOLIC PANEL
CO2: 25 mEq/L (ref 19–32)
Calcium: 10.1 mg/dL (ref 8.4–10.5)
Chloride: 103 mEq/L (ref 96–112)
Creatinine, Ser: 1.17 mg/dL — ABNORMAL HIGH (ref 0.50–1.10)
GFR calc Af Amer: 53 mL/min — ABNORMAL LOW (ref >90)
GFR calc Af Amer: 64 mL/min — ABNORMAL LOW (ref >90)
GFR calc non Af Amer: 55 mL/min — ABNORMAL LOW (ref >90)
Glucose, Bld: 287 mg/dL — ABNORMAL HIGH (ref 70–99)
Potassium: 3.4 mEq/L — ABNORMAL LOW (ref 3.5–5.1)
Sodium: 138 mEq/L (ref 135–145)
Sodium: 136 mEq/L (ref 135–145)

## 2013-04-14 LAB — PHOSPHORUS: Phosphorus: 1.6 mg/dL — ABNORMAL LOW (ref 2.3–4.6)

## 2013-04-14 LAB — MAGNESIUM: Magnesium: 1.8 mg/dL (ref 1.5–2.5)

## 2013-04-14 MED ORDER — NYSTATIN 100000 UNIT/ML MT SUSP
5.0000 mL | Freq: Four times a day (QID) | OROMUCOSAL | Status: DC
  Administered 2013-04-14 – 2013-04-24 (×26): 500000 [IU] via ORAL
  Filled 2013-04-14 (×42): qty 5

## 2013-04-14 MED ORDER — CALCITONIN (SALMON) 200 UNIT/ML IJ SOLN
100.0000 [IU] | Freq: Every day | INTRAMUSCULAR | Status: DC
  Administered 2013-04-14 – 2013-04-15 (×2): 100 [IU] via SUBCUTANEOUS
  Filled 2013-04-14 (×2): qty 0.5

## 2013-04-14 MED ORDER — POTASSIUM CHLORIDE CRYS ER 20 MEQ PO TBCR
20.0000 meq | EXTENDED_RELEASE_TABLET | Freq: Once | ORAL | Status: AC
  Administered 2013-04-14: 20 meq via ORAL
  Filled 2013-04-14: qty 1

## 2013-04-14 MED ORDER — K PHOS MONO-SOD PHOS DI & MONO 155-852-130 MG PO TABS
250.0000 mg | ORAL_TABLET | Freq: Three times a day (TID) | ORAL | Status: AC
  Administered 2013-04-14 (×3): 250 mg via ORAL
  Filled 2013-04-14 (×3): qty 1

## 2013-04-14 MED ORDER — MORPHINE SULFATE 2 MG/ML IJ SOLN: 0.5000 mg | INTRAMUSCULAR | Status: DC | PRN

## 2013-04-14 NOTE — Progress Notes (Addendum)
[facility] PROGRESS NOTE  Tonya Wise WGN:[id number] DOB: 1940-12-22 DOA: 04/10/2013 PCP: [name], MD  Brief narrative: Tonya Wise is an 72 y.o. female with a PMH of recently diagnosed NHL who was admitted on 04/10/2013 with dehydration, acute renal failure, hypercalcemia and rigors. Her hospital course has been complicated by the development of lethargy and confusion.  Assessment/Plan: Principal Problem:  Hypercalcemia related to malignancy, acute  -Admitted for IVF.  Given Pamidronate and Lasix 04/12/2013, with calcium beginning to now trend down. -Will start calcitonin. Stop when calcitonin normalizes based on albumin. Active Problems: Lethargy/metabolic encephalopathy -Likely a combination of hypercalcemia and steroid effect. Steroids stopped. Already got her dose for today. Received pamidronate and Lasix on 04/12/2013. Start calcitonin. Thrush  -Start nystatin. Hypokalemia / hypophosphatemia -Repeat potassium chloride 20 mEq by mouth x1. -Will give potassium phosphate today for 3 doses. Recheck potassium in the morning. Anorexia / severe protein calorie malnutrition -Seen by dietitian on 04/13/2013. Grade I diastolic dysfunction -Monitor I&O closely. I and O. balanced. Continue IV fluids. Chronic cough  -2 view CXR negative for pneumonia.  -Anti-tussives as needed. Lungs remain clear to auscultation on exam. Microcytic anemia, chronic  -S/P normal EGD on 03/29/13. BX negative for celiac sprue.  -Likely anemia of chronic disease related to malignancy.  Acute renal failure  -Secondary to dehydration.  -Continue IVF.  Creatinine beginning to decline but still slightly higher than usual baseline.  Non-Hodgkins lymphoma with fever/rigors, newly diagnosed  -Case discussed with Dr. Rosie Fate. Prednisone started 04/11/2013. On allopurinol.  -High risk for tumor lysis. Check uric acid/magnesium levels regularly. Not elevated at baseline. -Rigors/fever likely a  manifestation of lymphoma. Improved on prednisone. -No evidence of UTI based on urinalysis or pneumonia based on 2 view chest film.  -Given ongoing issues with lethargy and confusion, we'll stop prednisone after discussing the case with Dr. Rosie Fate, who will follow up with the patient later today. Elevated LFTs / hepatic steatosis  -Hepatic steatosis noted on CT abdomen/pelvis done 03/30/13.  Code Status: DNR  Family Communication: [name] (husband) [phone] (cell), [phone] (home). Husband and daughter updated at bedside. Disposition Plan: Home when stable.  Medical Consultants:  Dr. Myra Rude, Oncology  Other Consultants:  Dietitian  Anti-infectives:  None.  HPI/Subjective: Tonya Wise has not had any rigors or chills. Still having documented fevers. No nausea or vomiting. No appetite. Occasionally has a congested cough but no complaints of dyspnea. No significant pain except for some chronic back pain.   Objective: Filed Vitals:   04/13/13 0618 04/13/13 1405 04/13/13 2143 04/14/13 0542  BP: 148/78 153/84 160/82 156/95  Pulse: 120 106 112 123  Temp: 102 F (38.9 C) 99.4 F (37.4 C) 100.5 F (38.1 C) 101.3 F (38.5 C)  TempSrc: Oral  Oral Axillary  Resp: 18 16 16 21   Height:      Weight:      SpO2: 92% 94% 93% 92%    Intake/Output Summary (Last 24 hours) at 04/14/13 1033 Last data filed at 04/14/13 0900  Gross per 24 hour  Intake   1654 ml  Output   1450 ml  Net    204 ml    Exam: Gen:  NAD Cardiovascular:  Tachycardic Respiratory:  Lungs CTAB Gastrointestinal:  Abdomen soft, NT/ND, + BS Extremities:  No C/E/C  Data Reviewed: Basic Metabolic Panel:  Recent Labs Lab 04/10/13 1205 04/10/13 1644  04/11/13 0841 04/12/13 0403 04/13/13 0354 04/14/13 0403  NA 137  --   --  136  134* 134* 138  K 4.1  --   < > 3.6 4.0 3.4* 3.4*  CL  --   --   --  102 100 101 103  CO2 29  --   --  27 24 23 25   GLUCOSE 157*  --   --  155* 145* 200* 235*  BUN 17.0  --   --   17 22 21 17   CREATININE 1.4* 1.32*  --  1.27* 1.31* 1.22* 1.17*  CALCIUM 12.3*  --   --  10.7* 11.2* 11.5* 10.8*  MG  --  1.8  --   --  2.0 1.7 1.8  PHOS  --   --   --   --   --  2.1* 1.6*  < > = values in this interval not displayed. GFR Estimated Creatinine Clearance: 40.5 ml/min (by C-G formula based on Cr of 1.17). Liver Function Tests:  Recent Labs Lab 04/10/13 1205  AST 46*  ALT 14  ALKPHOS 223*  BILITOT 1.37*  PROT 6.1*  ALBUMIN 1.7*   CBC:  Recent Labs Lab 04/10/13 1205 04/10/13 1644 04/12/13 0403  WBC 6.6 5.4 6.6  NEUTROABS 5.2  --   --   HGB 9.0* 8.8* 8.1*  HCT 29.7* 29.5* 25.9*  MCV 77.5* 78.0 77.5*  PLT 214 175 175   Microbiology Recent Results (from the past 240 hour(s))  CULTURE, BLOOD (ROUTINE X 2)     Status: None   Collection Time    04/10/13  8:05 PM      Result Value Range Status   Specimen Description BLOOD RIGHT ARM   Final   Special Requests BOTTLES DRAWN AEROBIC AND ANAEROBIC 10CC   Final   Culture  Setup Time     Final   Value: 04/11/2013 02:12     Performed at [facility]   Culture     Final   Value:        BLOOD CULTURE RECEIVED NO GROWTH TO DATE CULTURE WILL BE HELD FOR 5 DAYS BEFORE ISSUING A FINAL NEGATIVE REPORT     Performed at [facility]   Report Status PENDING   Incomplete  CULTURE, BLOOD (ROUTINE X 2)     Status: None   Collection Time    04/10/13  8:10 PM      Result Value Range Status   Specimen Description BLOOD RIGHT HAND   Final   Special Requests BOTTLES DRAWN AEROBIC AND ANAEROBIC 10CC   Final   Culture  Setup Time     Final   Value: 04/11/2013 02:12     Performed at [facility]   Culture     Final   Value:        BLOOD CULTURE RECEIVED NO GROWTH TO DATE CULTURE WILL BE HELD FOR 5 DAYS BEFORE ISSUING A FINAL NEGATIVE REPORT     Performed at [facility]   Report Status PENDING   Incomplete     Procedures and Diagnostic Studies:  X-ray Chest Pa And Lateral  04/10/2013    CLINICAL DATA:  72 year old female with cough and weakness. Lymphoma.  EXAM: CHEST  2 VIEW  COMPARISON:  Chest CT 03/30/2013 and earlier.  FINDINGS: Stable prominent right hilum. See recent CT report. Other mediastinal contours are within normal limits. Visualized tracheal air column is within normal limits. No pneumothorax, pulmonary edema, pleural effusion or confluent pulmonary opacity. No acute osseous abnormality identified.  IMPRESSION: No new cardiopulmonary abnormality. Hilar lymphadenopathy, see 03/30/2013  chest CT.   Electronically Signed   By: Augusto Gamble M.D.   On: 04/10/2013 17:51    2-D echocardiogram 04/10/2013: Study conclusions: EF 50-55%. Doppler parameters consistent with abnormal left ventricular relaxation current disease grade 1 diastolic dysfunction)  Scheduled Meds: . allopurinol  300 mg Oral BID  . antiseptic oral rinse  15 mL Mouth Rinse BID  . aspirin EC  81 mg Oral Daily  . bisoprolol  10 mg Oral Daily  . enoxaparin (LOVENOX) injection  40 mg Subcutaneous Q24H  . feeding supplement  237 mL Oral BID BM  . feeding supplement  1 Container Oral q morning - 10a  . pantoprazole  40 mg Oral Daily  . predniSONE  100 mg Oral Q breakfast   Continuous Infusions: . sodium chloride 75 mL/hr at 04/13/13 1100    Time spent: 35 minutes with > 50% of time discussing current diagnostic test results, clinical impression and plan of care.    LOS: 4 days   RAMA,CHRISTINA  [facility] Pager [phone].   *Please note that the hospitalists switch teams on Wednesdays. Please call the flow manager at [phone] if you are having difficulty reaching the hospitalist taking care of this patient as she can update you and provide the most up-to-date pager number of provider caring for the patient. If 8PM-8AM, please contact night-coverage at [email or web address], password [facility]  04/14/2013, 10:33 AM

## 2013-04-14 NOTE — Progress Notes (Signed)
Patient ID: Tonya Wise, female   DOB: 23-Jul-1941, 72 y.o.   MRN: [id number] Request received for CT guided bone marrow biopsy on pt with hx of NHL. Additional PMH as below. Exam: pt lethargic, but alert; chest- CTA bilat; heart- tachy but reg rhythm; abd- soft,+BS,NT; ext- 1+ LE edema bilat.    Filed Vitals:   04/13/13 1405 04/13/13 2143 04/14/13 0542 04/14/13 1329  BP: 153/84 160/82 156/95 136/77  Pulse: 106 112 123 93  Temp: 99.4 F (37.4 C) 100.5 F (38.1 C) 101.3 F (38.5 C) 98 F (36.7 C)  TempSrc:  Oral Axillary   Resp: 16 16 21 18   Height:      Weight:      SpO2: 94% 93% 92% 95%   Past Medical History  Diagnosis Date  . Hypertension   . Renal insufficiency   . Osteopenia   . Allergy   . Heart murmur   . GERD (gastroesophageal reflux disease)   . Anemia   . Hypercalcemia 03/27/2013  . Non-Hodgkins lymphoma 04/06/2013  . Diastolic dysfunction, grade I 11/10/2438   Past Surgical History  Procedure Laterality Date  . Appendectomy    . Colonoscopy  2008  . Spine surgery    . Back surgery      LOWER BACK TWICE  . Abdominal hysterectomy    . Esophagogastroduodenoscopy N/A 03/29/2013    Procedure: ESOPHAGOGASTRODUODENOSCOPY (EGD);  Surgeon: Charna Elizabeth, MD;  Location: [facility] ENDOSCOPY;  Service: Endoscopy;  Laterality: N/A;  . Inguinal lymph node biopsy Right 03/31/2013    Procedure: INGUINAL LYMPH NODE BIOPSY;  Surgeon: Cherylynn Ridges, MD;  Location: [facility] OR;  Service: General;  Laterality: Right;  X-ray Chest Pa And Lateral   04/10/2013   CLINICAL DATA:  72 year old female with cough and weakness. Lymphoma.  EXAM: CHEST  2 VIEW  COMPARISON:  Chest CT 03/30/2013 and earlier.  FINDINGS: Stable prominent right hilum. See recent CT report. Other mediastinal contours are within normal limits. Visualized tracheal air column is within normal limits. No pneumothorax, pulmonary edema, pleural effusion or confluent pulmonary opacity. No acute osseous abnormality identified.  IMPRESSION: No new  cardiopulmonary abnormality. Hilar lymphadenopathy, see 03/30/2013 chest CT.   Electronically Signed   By: Augusto Gamble M.D.   On: 04/10/2013 17:51   Dg Chest 2 View  03/27/2013   *RADIOLOGY REPORT*  Clinical Data: Shortness of breath  CHEST - 2 VIEW  Comparison: 03/12/13  Findings: The heart and pulmonary vascularity are within normal limits.  The lungs are clear bilaterally.  No acute bony abnormality is seen.  IMPRESSION: No acute abnormality noted.   Original Report Authenticated By: Alcide Clever, M.D.   Ct Head Wo Contrast  04/14/2013   *RADIOLOGY REPORT*  Clinical Data: Lymphoma.  Lethargy  CT HEAD WITHOUT CONTRAST  Technique:  Contiguous axial images were obtained from the base of the skull through the vertex without contrast.  Comparison: MRI 11/26/2004.  CT 11/25/2004  Findings: Mild atrophy.  Negative for hydrocephalus.  Mild hypodensity in the periventricular white matter similar to the prior study.  No acute infarct.  Negative for hemorrhage or mass.  IMPRESSION: Mild atrophy and mild chronic changes in the white matter.  No acute abnormality.   Original Report Authenticated By: Janeece Riggers, M.D.   Ct Chest W Contrast  03/30/2013   CLINICAL DATA:  72 year old female with fever of unknown origin, anemia, blood loss of unknown etiology, possible malignancy, renal insufficiency, hypertension.  EXAM: CT CHEST, ABDOMEN, AND PELVIS WITH  CONTRAST  TECHNIQUE: Multidetector CT imaging of the chest, abdomen and pelvis was performed following the standard protocol during bolus administration of intravenous contrast.  CONTRAST:  OMNIPAQUE IOHEXOL 300 MG/ML  SOLN  COMPARISON:  Chest radiographs 03/28/2013 and earlier. Abdominal radiographs 11/25/2004.  FINDINGS: CT CHEST FINDINGS  Major airways are patent. No pericardial or pleural effusion. In the posterior right upper lobe there is mild reticular nodular Peribronchovascular opacity which appears to be inflammatory (series 3, image 17). Otherwise, the lungs  are clear.  No acute or suspicious osseous abnormality identified in the chest.  Incidental breast implants.  Negative visualized thoracic inlet. However, there is hilar and mediastinal lymphadenopathy. Right peritracheal node measures up to 15 mm short axis. Hilar nodes measure up to 20 mm individually right greater than left. Bulky subcarinal lymphadenopathy, measuring up to 22 mm. There is also a right greater than left Mild axillary lymphadenopathy, with individual nodes measuring up tip 20 mm short axis. No breast soft tissue mass identified.  Major mediastinal vascular structures remain patent.  CT ABDOMEN AND PELVIS FINDINGS  Splenomegaly with permeative/miliary hypodensity scattered in the splenic parenchyma. Bulky right greater than left retroperitoneal lymphadenopathy, with AE right aortocaval nodal conglomeration measuring up to 36 mm short axis. Less pronounced splenic hilum, gastrohepatic ligament, and porta hepatis lymphadenopathy. Retroperitoneal lymphadenopathy continues into the pelvis. Left iliac bifurcation node measures 23 mm short axis. Right inguinal rounded lymph node measures 24 mm short axis. Smaller round inguinal lymph nodes on the left. Abdominal mesenteric nodes also appear enlarged measuring up to 12 mm short axis.  Trace pelvic free fluid. No abdominal free fluid. Oral contrast has reached the rectum. Uterus and adnexal within normal limits. Unremarkable bladder. Redundant left colon. Negative right colon and appendix. No dilated small bowel. Decompressed stomach. Negative pancreas, adrenal glands, kidneys, and gallbladder. Low density throughout the liver suggesting steatosis. Major arterial structures in the abdomen and pelvis are patent. Portal venous system within normal limits.  Sequelae of L3-L4 and L4-L5 fusion. 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 No acute or  suspicious osseous lesion in the abdomen or pelvis.  IMPRESSION: CT CHEST IMPRESSION  1. Mediastinal, hilar, and axillary lymphadenopathy, in conjunction with the abdomen and pelvis findings, most compatible with non-Hodgkin's lymphoma.  2. Trace abnormal lung opacity in the posterior right upper lobe may reflect mild acute infection or chronic postinflammatory change.  CT ABDOMEN AND PELVIS IMPRESSION  1. 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  2. Abdominal and pelvic lymphadenopathy maximal in the retroperitoneum and right inguinal nodal stations.  3. 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  4. Hepatic steatosis.   Electronically Signed   By: Augusto Gamble   On: 03/30/2013 20:31   US Abdomen Complete  03/30/2013   *RADIOLOGY REPORT*  Clinical Data:  Elevated liver function tests  COMPLETE ABDOMINAL ULTRASOUND  Comparison:  None.  Findings:  Gallbladder:  Well distended with evidence of gallbladder sludge. No definitive stones are seen.  Wall thickening is noted.  Common bile duct:  5.1 mm.  This is within normal limits.  Liver:  A focal area of increased echogenicity is identified adjacent to the gallbladder fossa likely related to focal fatty infiltration or a small hemangioma.  IVC:  Appears normal.  Pancreas:  No focal  abnormality seen.  Spleen:  13.4 cm.  Diffusely heterogeneous  and mildly enlarged.  Right Kidney:  10.7 cm.  No mass lesion or hydronephrosis is noted.  Left Kidney:  11 cm without mass lesion or hydronephrosis.  Abdominal aorta:  No aneurysm identified.  Adjacent to the aorta near the origin of the superior mesenteric artery there is a 4.5 by 2.5 cm hypoechoic area which may represent a lymph node.  IMPRESSION: Gallbladder sludge.  Hyper echoic lesion within the liver of uncertain significance.  Splenomegaly with diffuse  heterogeneity.  Hypoechoic area adjacent to the proximal aorta of uncertain significance.  CT of the abdomen and pelvis with contrast may be helpful for further evaluation.   Original Report Authenticated By: Alcide Clever, M.D.   Ct Abdomen Pelvis W Contrast  03/30/2013   CLINICAL DATA:  72 year old female with fever of unknown origin, anemia, blood loss of unknown etiology, possible malignancy, renal insufficiency, hypertension.  EXAM: CT CHEST, ABDOMEN, AND PELVIS WITH CONTRAST  TECHNIQUE: Multidetector CT imaging of the chest, abdomen and pelvis was performed following the standard protocol during bolus administration of intravenous contrast.  CONTRAST:  OMNIPAQUE IOHEXOL 300 MG/ML  SOLN  COMPARISON:  Chest radiographs 03/28/2013 and earlier. Abdominal radiographs 11/25/2004.  FINDINGS: CT CHEST FINDINGS  Major airways are patent. No pericardial or pleural effusion. In the posterior right upper lobe there is mild reticular nodular Peribronchovascular opacity which appears to be inflammatory (series 3, image 17). Otherwise, the lungs are clear.  No acute or suspicious osseous abnormality identified in the chest.  Incidental breast implants.  Negative visualized thoracic inlet. However, there is hilar and mediastinal lymphadenopathy. Right peritracheal node measures up to 15 mm short axis. Hilar nodes measure up to 20 mm individually right greater than left. Bulky subcarinal lymphadenopathy, measuring up to 22 mm. There is also a right greater than left Mild axillary lymphadenopathy, with individual nodes measuring up tip 20 mm short axis. No breast soft tissue mass identified.  Major mediastinal vascular structures remain patent.  CT ABDOMEN AND PELVIS FINDINGS  Splenomegaly with permeative/miliary hypodensity scattered in the splenic parenchyma. Bulky right greater than left retroperitoneal lymphadenopathy, with AE right aortocaval nodal conglomeration measuring up to 36 mm short axis. Less pronounced  splenic hilum, gastrohepatic ligament, and porta hepatis lymphadenopathy. Retroperitoneal lymphadenopathy continues into the pelvis. Left iliac bifurcation node measures 23 mm short axis. Right inguinal rounded lymph node measures 24 mm short axis. Smaller round inguinal lymph nodes on the left. Abdominal mesenteric nodes also appear enlarged measuring up to 12 mm short axis.  Trace pelvic free fluid. No abdominal free fluid. Oral contrast has reached the rectum. Uterus and adnexal within normal limits. Unremarkable bladder. Redundant left colon. Negative right colon and appendix. No dilated small bowel. Decompressed stomach. Negative pancreas, adrenal glands, kidneys, and gallbladder. Low density throughout the liver suggesting steatosis. Major arterial structures in the abdomen and pelvis are patent. Portal venous system within normal limits.  Sequelae of L3-L4 and L4-L5 fusion. 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 No acute or suspicious osseous lesion in the abdomen or pelvis.  IMPRESSION: CT CHEST IMPRESSION  1. Mediastinal, hilar, and axillary lymphadenopathy, in conjunction with the abdomen and pelvis findings, most compatible with non-Hodgkin's lymphoma.  2. Trace abnormal lung opacity in the posterior right upper lobe may reflect mild acute infection or chronic postinflammatory change.  CT ABDOMEN AND PELVIS IMPRESSION  1. 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  2. Abdominal and pelvic lymphadenopathy maximal in the retroperitoneum and right inguinal nodal stations.  3. 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  4. Hepatic steatosis.   Electronically Signed   By: Augusto Gamble   On: 03/30/2013 20:31   Dg Chest Port 1 View  03/28/2013    CLINICAL DATA:  Fever  EXAM: PORTABLE CHEST - 1 VIEW  COMPARISON:  03/27/2013  FINDINGS: Decreasing lung volumes without focal airspace opacity. Heart is normal size. No effusions. No acute bony abnormality.  IMPRESSION: No active disease.   Electronically Signed   By: Charlett Nose   On: 03/28/2013 22:21  Results for orders placed during the hospital encounter of 04/10/13  CULTURE, BLOOD (ROUTINE X 2)      Result Value Range   Specimen Description BLOOD RIGHT HAND     Special Requests BOTTLES DRAWN AEROBIC AND ANAEROBIC 10CC     Culture  Setup Time       Value: 04/11/2013 02:12     Performed at [facility]   Culture       Value:        BLOOD CULTURE RECEIVED NO GROWTH TO DATE CULTURE WILL BE HELD FOR 5 DAYS BEFORE ISSUING A FINAL NEGATIVE REPORT     Performed at [facility]   Report Status PENDING    CULTURE, BLOOD (ROUTINE X 2)      Result Value Range   Specimen Description BLOOD RIGHT ARM     Special Requests BOTTLES DRAWN AEROBIC AND ANAEROBIC 10CC     Culture  Setup Time       Value: 04/11/2013 02:12     Performed at [facility]   Culture       Value:        BLOOD CULTURE RECEIVED NO GROWTH TO DATE CULTURE WILL BE HELD FOR 5 DAYS BEFORE ISSUING A FINAL NEGATIVE REPORT     Performed at [facility]   Report Status PENDING    CBC      Result Value Range   WBC 5.4  4.0 - 10.5 K/uL   RBC 3.78 (*) 3.87 - 5.11 MIL/uL   Hemoglobin 8.8 (*) 12.0 - 15.0 g/dL   HCT 91.4 (*) 78.2 - 95.6 %   MCV 78.0  78.0 - 100.0 fL   MCH 23.3 (*) 26.0 - 34.0 pg   MCHC 29.8 (*) 30.0 - 36.0 g/dL   RDW 21.3 (*) 08.6 - 57.8 %   Platelets 175  150 - 400 K/uL  CREATININE, SERUM      Result Value Range   Creatinine, Ser 1.32 (*) 0.50 - 1.10 mg/dL   GFR calc non Af Amer 40 (*) >90 mL/min   GFR calc Af Amer 46 (*) >90 mL/min  URINALYSIS, ROUTINE W REFLEX MICROSCOPIC      Result Value Range   Color, Urine YELLOW  YELLOW   APPearance CLEAR  CLEAR   Specific Gravity, Urine  1.019  1.005 - 1.030   pH 6.0  5.0 - 8.0   Glucose, UA NEGATIVE  NEGATIVE mg/dL   Hgb urine dipstick NEGATIVE  NEGATIVE   Bilirubin Urine NEGATIVE  NEGATIVE   Ketones, ur NEGATIVE  NEGATIVE mg/dL   Protein, ur NEGATIVE  NEGATIVE mg/dL   Urobilinogen, UA 1.0  0.0 - 1.0 mg/dL  Nitrite NEGATIVE  NEGATIVE   Leukocytes, UA NEGATIVE  NEGATIVE  MAGNESIUM      Result Value Range   Magnesium 1.8  1.5 - 2.5 mg/dL  URIC ACID      Result Value Range   Uric Acid, Serum 5.5  2.4 - 7.0 mg/dL  BASIC METABOLIC PANEL      Result Value Range   Sodium 136  135 - 145 mEq/L   Potassium 3.6  3.5 - 5.1 mEq/L   Chloride 102  96 - 112 mEq/L   CO2 27  19 - 32 mEq/L   Glucose, Bld 155 (*) 70 - 99 mg/dL   BUN 17  6 - 23 mg/dL   Creatinine, Ser 5.78 (*) 0.50 - 1.10 mg/dL   Calcium 46.9 (*) 8.4 - 10.5 mg/dL   GFR calc non Af Amer 41 (*) >90 mL/min   GFR calc Af Amer 48 (*) >90 mL/min  BASIC METABOLIC PANEL      Result Value Range   Sodium 134 (*) 135 - 145 mEq/L   Potassium 4.0  3.5 - 5.1 mEq/L   Chloride 100  96 - 112 mEq/L   CO2 24  19 - 32 mEq/L   Glucose, Bld 145 (*) 70 - 99 mg/dL   BUN 22  6 - 23 mg/dL   Creatinine, Ser 6.29 (*) 0.50 - 1.10 mg/dL   Calcium 52.8 (*) 8.4 - 10.5 mg/dL   GFR calc non Af Amer 40 (*) >90 mL/min   GFR calc Af Amer 46 (*) >90 mL/min  CBC      Result Value Range   WBC 6.6  4.0 - 10.5 K/uL   RBC 3.34 (*) 3.87 - 5.11 MIL/uL   Hemoglobin 8.1 (*) 12.0 - 15.0 g/dL   HCT 41.3 (*) 24.4 - 01.0 %   MCV 77.5 (*) 78.0 - 100.0 fL   MCH 24.3 (*) 26.0 - 34.0 pg   MCHC 31.3  30.0 - 36.0 g/dL   RDW 27.2 (*) 53.6 - 64.4 %   Platelets 175  150 - 400 K/uL  URIC ACID      Result Value Range   Uric Acid, Serum 3.2  2.4 - 7.0 mg/dL  MAGNESIUM      Result Value Range   Magnesium 2.0  1.5 - 2.5 mg/dL  BASIC METABOLIC PANEL      Result Value Range   Sodium 134 (*) 135 - 145 mEq/L   Potassium 3.4 (*) 3.5 - 5.1 mEq/L   Chloride 101  96 - 112 mEq/L   CO2 23  19 - 32 mEq/L    Glucose, Bld 200 (*) 70 - 99 mg/dL   BUN 21  6 - 23 mg/dL   Creatinine, Ser 0.34 (*) 0.50 - 1.10 mg/dL   Calcium 74.2 (*) 8.4 - 10.5 mg/dL   GFR calc non Af Amer 43 (*) >90 mL/min   GFR calc Af Amer 50 (*) >90 mL/min  URIC ACID      Result Value Range   Uric Acid, Serum 2.3 (*) 2.4 - 7.0 mg/dL  PHOSPHORUS      Result Value Range   Phosphorus 2.1 (*) 2.3 - 4.6 mg/dL  MAGNESIUM      Result Value Range   Magnesium 1.7  1.5 - 2.5 mg/dL  BASIC METABOLIC PANEL      Result Value Range   Sodium 138  135 - 145 mEq/L   Potassium 3.4 (*) 3.5 - 5.1 mEq/L   Chloride 103  96 - 112 mEq/L   CO2 25  19 - 32 mEq/L   Glucose, Bld 235 (*) 70 - 99 mg/dL   BUN 17  6 - 23 mg/dL   Creatinine, Ser 1.61 (*) 0.50 - 1.10 mg/dL   Calcium 09.6 (*) 8.4 - 10.5 mg/dL   GFR calc non Af Amer 46 (*) >90 mL/min   GFR calc Af Amer 53 (*) >90 mL/min  URIC ACID      Result Value Range   Uric Acid, Serum 1.8 (*) 2.4 - 7.0 mg/dL  PHOSPHORUS      Result Value Range   Phosphorus 1.6 (*) 2.3 - 4.6 mg/dL  MAGNESIUM      Result Value Range   Magnesium 1.8  1.5 - 2.5 mg/dL   A/P: Pt with hx of NHL. Tent plan is for CT guided bone marrow biopsy on 9/17. Details/risks of procedure d/w pt/husband with their understanding and consent.

## 2013-04-15 ENCOUNTER — Ambulatory Visit (HOSPITAL_COMMUNITY): Admission: RE | Admit: 2013-04-15 | Payer: Medicare Other | Source: Ambulatory Visit

## 2013-04-15 ENCOUNTER — Encounter (HOSPITAL_COMMUNITY): Payer: Medicare Other

## 2013-04-15 ENCOUNTER — Inpatient Hospital Stay (HOSPITAL_COMMUNITY): Payer: Medicare Other

## 2013-04-15 ENCOUNTER — Ambulatory Visit (HOSPITAL_COMMUNITY): Payer: Medicare Other

## 2013-04-15 DIAGNOSIS — N179 Acute kidney failure, unspecified: Secondary | ICD-10-CM

## 2013-04-15 DIAGNOSIS — G9341 Metabolic encephalopathy: Secondary | ICD-10-CM

## 2013-04-15 DIAGNOSIS — C8589 Other specified types of non-Hodgkin lymphoma, extranodal and solid organ sites: Secondary | ICD-10-CM

## 2013-04-15 LAB — PROTIME-INR
INR: 1.48 (ref 0.00–1.49)
Prothrombin Time: 17.5 seconds — ABNORMAL HIGH (ref 11.6–15.2)

## 2013-04-15 LAB — PHOSPHORUS: Phosphorus: 1.7 mg/dL — ABNORMAL LOW (ref 2.3–4.6)

## 2013-04-15 LAB — MAGNESIUM: Magnesium: 1.8 mg/dL (ref 1.5–2.5)

## 2013-04-15 LAB — BASIC METABOLIC PANEL
CO2: 24 mEq/L (ref 19–32)
Glucose, Bld: 246 mg/dL — ABNORMAL HIGH (ref 70–99)
Potassium: 3.3 mEq/L — ABNORMAL LOW (ref 3.5–5.1)
Sodium: 138 mEq/L (ref 135–145)

## 2013-04-15 LAB — HEMOGLOBIN AND HEMATOCRIT, BLOOD
HCT: 21.5 % — ABNORMAL LOW (ref 36.0–46.0)
Hemoglobin: 6.3 g/dL — CL (ref 12.0–15.0)

## 2013-04-15 LAB — CBC
Hemoglobin: 6.9 g/dL — CL (ref 12.0–15.0)
RBC: 2.94 MIL/uL — ABNORMAL LOW (ref 3.87–5.11)
WBC: 6.1 10*3/uL (ref 4.0–10.5)

## 2013-04-15 MED ORDER — MIDAZOLAM HCL 2 MG/2ML IJ SOLN
INTRAMUSCULAR | Status: AC
  Filled 2013-04-15: qty 4

## 2013-04-15 MED ORDER — FENTANYL CITRATE 0.05 MG/ML IJ SOLN
INTRAMUSCULAR | Status: AC
  Filled 2013-04-15: qty 4

## 2013-04-15 MED ORDER — VANCOMYCIN HCL 500 MG IV SOLR
500.0000 mg | Freq: Two times a day (BID) | INTRAVENOUS | Status: DC
  Administered 2013-04-16 – 2013-04-18 (×5): 500 mg via INTRAVENOUS
  Filled 2013-04-15 (×7): qty 500

## 2013-04-15 MED ORDER — POTASSIUM CHLORIDE CRYS ER 20 MEQ PO TBCR: 20.0000 meq | EXTENDED_RELEASE_TABLET | Freq: Once | ORAL | Status: DC

## 2013-04-15 MED ORDER — POTASSIUM CHLORIDE CRYS ER 20 MEQ PO TBCR
40.0000 meq | EXTENDED_RELEASE_TABLET | Freq: Once | ORAL | Status: AC
  Administered 2013-04-15: 40 meq via ORAL
  Filled 2013-04-15: qty 2

## 2013-04-15 MED ORDER — ADULT MULTIVITAMIN W/MINERALS CH
1.0000 | ORAL_TABLET | Freq: Every day | ORAL | Status: DC
  Administered 2013-04-16 – 2013-04-24 (×8): 1 via ORAL
  Filled 2013-04-15 (×10): qty 1

## 2013-04-15 MED ORDER — FENTANYL CITRATE 0.05 MG/ML IJ SOLN
INTRAMUSCULAR | Status: AC | PRN
  Administered 2013-04-15: 50 ug via INTRAVENOUS

## 2013-04-15 MED ORDER — VANCOMYCIN HCL IN DEXTROSE 750-5 MG/150ML-% IV SOLN
750.0000 mg | Freq: Once | INTRAVENOUS | Status: AC
  Administered 2013-04-15: 750 mg via INTRAVENOUS
  Filled 2013-04-15: qty 150

## 2013-04-15 MED ORDER — MIDAZOLAM HCL 2 MG/2ML IJ SOLN
INTRAMUSCULAR | Status: AC | PRN
  Administered 2013-04-15: 0.5 mg via INTRAVENOUS

## 2013-04-15 MED ORDER — DEXTROSE 5 % IV SOLN
2.0000 g | INTRAVENOUS | Status: DC
  Administered 2013-04-15 – 2013-04-17 (×3): 2 g via INTRAVENOUS
  Filled 2013-04-15 (×4): qty 2

## 2013-04-15 MED ORDER — GLUCERNA SHAKE PO LIQD
237.0000 mL | Freq: Two times a day (BID) | ORAL | Status: DC
  Administered 2013-04-16: 237 mL via ORAL
  Filled 2013-04-15 (×5): qty 237

## 2013-04-15 NOTE — Progress Notes (Addendum)
[facility] PROGRESS NOTE  Tonya Wise VHQ:[id number] DOB: 1941-07-09 DOA: 04/10/2013 PCP: [name], MD  Brief narrative: 72 year old female with past medical history of recently diagnosed non-Hodgkin's lymphoma who was admitted to [facility] ED 04/10/2013 with dehydration, acute renal failure, hypercalcemia. Hospital course has been complicated due to lethargy and increasing confusion.  Assessment/Plan:   Principal Problem:  Hypercalcemia of malignancy - Patient was given Pamidronate and Lasix 04/12/2013 as well as calcitonin with calcium now being within normal limits - Calcitonin is now stopped  Active Problems:  Anemia of chronic disease - Hemoglobin 6.3 this morning - Will transfuse one unit of blood for now considering patient also has diastolic dysfunction. If no side effects and patient tolerates well we'll transfuse another unit. Lethargy/metabolic encephalopathy  - Likely a combination of hypercalcemia and steroid effect. Steroids stopped.  - Will continue to monitor mental status - Urinalysis negative. Chest x-ray negative Thrush  - Started nystatin.  - tolerated PO intake this am Hypokalemia / hypophosphatemia  - Repeat potassium chloride 20 mEq by mouth x1. Potassium 3.3 this am so will supplement again with PO potassium 40 Meq once Anorexia / severe protein calorie malnutrition  - continue nutritional supplements - seen by nutritionist  Grade I diastolic dysfunction  - Stable  Chronic cough  - No evidence of pneumonia on chest x-ray - Continue Robitussin every 4 hours as needed for cough Acute renal failure  - Secondary to dehydration.  - Kidney function is now within normal limits. Resolved with IV fluid Non-Hodgkins lymphoma with fever/rigors, newly diagnosed  - Patient initially started on prednisone 04/11/2013. Due to the increasing lethargy and confusion prednisone is now stopped. - Due to high risk for tumor lysis patient is on allopurinol. While  in hospital will continue to check uric acid and needs every 2-3 days.  started 04/11/2013. On allopurinol.  - Patient had bone marrow biopsy of left iliac crest done today 04/15/2013 Elevated LFTs / hepatic steatosis  - Hepatic steatosis noted on CT abdomen/pelvis done 03/30/13.   Code Status: DNR/DNI Family Communication: [name] (husband) [phone] (cell), [phone] (home). Husband updated at bedside.  Disposition Plan: Home when stable.   Medical Consultants:  Dr. Myra Rude, Oncology Other Consultants:  Dietitian Anti-infectives:  None.   Manson Passey, MD  [facility] Pager [phone]  If 7PM-7AM, please contact night-coverage [email or web address] Password [facility] 04/15/2013, 12:18 PM   LOS: 5 days    HPI/Subjective: No acute overnight events.  Objective: Filed Vitals:   04/15/13 0905 04/15/13 0920 04/15/13 0950 04/15/13 1020  BP: 138/73 141/80 139/70 139/75  Pulse: 103 108 107 98  Temp: 98.8 F (37.1 C)  98.4 F (36.9 C) 98.3 F (36.8 C)  TempSrc:      Resp: 22 24 24 22   Height:      Weight:      SpO2: 95% 96% 95% 96%    Intake/Output Summary (Last 24 hours) at 04/15/13 1218 Last data filed at 04/15/13 0900  Gross per 24 hour  Intake   1770 ml  Output   2050 ml  Net   -280 ml    Exam:   General:  Pt is sleeping, not in acute distress  Cardiovascular: Tachycardic, regular rhythm, S1/S2 appreciated  Respiratory: Clear to auscultation bilaterally, no wheezing, no crackles, no rhonchi  Abdomen: Soft, non tender, non distended, bowel sounds present, no guarding  Extremities: Trace edema, pulses DP and PT palpable bilaterally  Neuro: Grossly nonfocal  Data Reviewed: Basic Metabolic  Panel:  Recent Labs Lab 04/10/13 1644  04/12/13 0403 04/13/13 0354 04/14/13 0403 04/14/13 1735 04/15/13 0349  NA  --   < > 134* 134* 138 136 138  K  --   < > 4.0 3.4* 3.4* 3.6 3.3*  CL  --   < > 100 101 103 103 104  CO2  --   < > 24 23 25 24 24   GLUCOSE  --   < >  145* 200* 235* 287* 246*  BUN  --   < > 22 21 17 16 15   CREATININE 1.32*  < > 1.31* 1.22* 1.17* 1.00 0.98  CALCIUM  --   < > 11.2* 11.5* 10.8* 10.1 9.7  MG 1.8  --  2.0 1.7 1.8  --  1.8  PHOS  --   --   --  2.1* 1.6*  --  1.7*  < > = values in this interval not displayed. Liver Function Tests:  Recent Labs Lab 04/10/13 1205  AST 46*  ALT 14  ALKPHOS 223*  BILITOT 1.37*  PROT 6.1*  ALBUMIN 1.7*   No results found for this basename: LIPASE, AMYLASE,  in the last 168 hours No results found for this basename: AMMONIA,  in the last 168 hours CBC:  Recent Labs Lab 04/10/13 1205 04/10/13 1644 04/12/13 0403 04/15/13 0349 04/15/13 1056  WBC 6.6 5.4 6.6 6.1  --   NEUTROABS 5.2  --   --   --   --   HGB 9.0* 8.8* 8.1* 6.9* 6.3*  HCT 29.7* 29.5* 25.9* 23.0* 21.5*  MCV 77.5* 78.0 77.5* 78.2  --   PLT 214 175 175 133*  --    Cardiac Enzymes: No results found for this basename: CKTOTAL, CKMB, CKMBINDEX, TROPONINI,  in the last 168 hours BNP: No components found with this basename: POCBNP,  CBG: No results found for this basename: GLUCAP,  in the last 168 hours  CULTURE, BLOOD (ROUTINE X 2)     Status: None   Collection Time    04/10/13  8:05 PM      Result Value Range Status   Specimen Description BLOOD RIGHT ARM   Final   Special Requests BOTTLES DRAWN AEROBIC AND ANAEROBIC 10CC   Final   Culture  Setup Time     Final   Value: 04/11/2013 02:12     Performed at [facility]   Culture     Final   Value:        BLOOD CULTURE RECEIVED NO GROWTH TO DATE CULTURE WILL BE HELD FOR 5 DAYS BEFORE ISSUING A FINAL NEGATIVE REPORT     Performed at [facility]   Report Status PENDING   Incomplete  CULTURE, BLOOD (ROUTINE X 2)     Status: None   Collection Time    04/10/13  8:10 PM      Result Value Range Status   Specimen Description BLOOD RIGHT HAND   Final   Special Requests BOTTLES DRAWN AEROBIC AND ANAEROBIC 10CC   Final   Culture  Setup Time     Final   Value:  04/11/2013 02:12     Performed at [facility]   Culture     Final   Value:        BLOOD CULTURE RECEIVED NO GROWTH TO DATE CULTURE WILL BE HELD FOR 5 DAYS BEFORE ISSUING A FINAL NEGATIVE REPORT     Performed at [facility]   Report Status PENDING  Incomplete     Studies: Ct Head Wo Contrast 04/14/2013   IMPRESSION: Mild atrophy and mild chronic changes in the white matter.  No acute abnormality.   Original Report Authenticated By: Janeece Riggers, M.D.   Ct Biopsy 04/15/2013   IMPRESSION:  Successful CT guided left iliac bone marrow aspiration and core biopsies.   Original Report Authenticated By: Tacey Ruiz, MD    Scheduled Meds: . allopurinol  300 mg Oral BID  . aspirin EC  81 mg Oral Daily  . bisoprolol  10 mg Oral Daily  . enoxaparin (LOVENOX)   40 mg Subcutaneous Q24H  . multivitamin   1 tablet Oral Daily  . nystatin  5 mL Oral QID  . pantoprazole  40 mg Oral Daily   Continuous Infusions: . sodium chloride 75 mL/hr at 04/14/13 1635

## 2013-04-15 NOTE — Consult Note (Signed)
S: Patient still lethargic but able to follow commands and answer questions appropriately.  Family including husband, daughter and sister are at bedside.  She had a bone marrow biopsy earlier today.   O: BP 142/96  Pulse 106  Temp(Src) 98.9 F (37.2 C) (Oral)  Resp 18  Ht 5\' 2"  (1.575 m)  Wt 155 lb (70.308 kg)  BMI 28.34 kg/m2  SpO2 91%  Gen:  Laying in bed asleep. But easily arousable. CVS: Slightly tachycardia.  Lungs: CTA/Bl  Labs: CBC    Component Value Date/Time   WBC 6.1 04/15/2013 0349   WBC 6.6 04/10/2013 1205   RBC 2.94* 04/15/2013 0349   RBC 3.83 04/10/2013 1205   HGB 6.3* 04/15/2013 1056   HGB 9.0* 04/10/2013 1205   HCT 21.5* 04/15/2013 1056   HCT 29.7* 04/10/2013 1205   PLT 133* 04/15/2013 0349   PLT 214 04/10/2013 1205   MCV 78.2 04/15/2013 0349   MCV 77.5* 04/10/2013 1205   MCH 23.5* 04/15/2013 0349   MCH 23.5* 04/10/2013 1205   MCHC 30.0 04/15/2013 0349   MCHC 30.3* 04/10/2013 1205   RDW 17.9* 04/15/2013 0349   RDW 18.0* 04/10/2013 1205   LYMPHSABS 0.6* 04/10/2013 1205   LYMPHSABS 0.9 03/27/2013 1206   MONOABS 0.9 04/10/2013 1205   MONOABS 1.1* 03/27/2013 1206   EOSABS 0.0 04/10/2013 1205   EOSABS 0.0 03/27/2013 1206   BASOSABS 0.0 04/10/2013 1205   BASOSABS 0.0 03/27/2013 1206   AP. 72 yo female w newly diagnosed advanced stage DLBCL s/p bone marrow biopsy (09/17) now w resolving hypercalcemia  1. DLBCL.   --Discussed with family the need to determine extent of disease.  We will await bone marrow biopsy results and will obtain a PET/CT. Her functional status is questionable presently due to metabolic and nutritional support.   2. Hypercalcemia likely secondary to malignancy -- I am hopeful as this resolves that her activity and alertness will also improve  Jaclyn Prime. Manya Balash, MD,MS

## 2013-04-15 NOTE — Progress Notes (Signed)
CRITICAL VALUE ALERT  Critical value received:  HGB 6.9  Date of notification:  9/17  Time of notification:  0500  Critical value read back: yes  Nurse who received alert:  Jilda Panda  MD notified (1st page):  Donnamarie Poag, NP  Time of first page:  0505  MD notified (2nd page):  Time of second page:  Responding MD:  K.Kirby,NP  Time MD responded:  0530

## 2013-04-15 NOTE — Progress Notes (Signed)
Post bone marrow biopsy site monitored. No bleeding , hematoma noted on site, dressing dry and intact.

## 2013-04-15 NOTE — Progress Notes (Addendum)
NUTRITION FOLLOW UP  Intervention:   - D/C Ensure Complete and Ensure pudding, will order Glucerna shakes BID - Recommend nursing get updated weight - PO intake remains inadequate, recommend MD re-discuss enteral nutrition as pt on day 5 of inadequate oral intake - Multivitamin 1 tablet PO daily - Will continue to monitor   Nutrition Dx:   Inadequate oral intake related to NH lymphoma as evidenced by reported weight loss and intake less than estimated needs - ongoing    Goal:   Pt to meet >/= 90% of their estimated nutrition needs - not met   Monitor:   Weights, labs, intake  Assessment:   Pt with inadequate PO intake since admission (0-40% of meals) r/t lethargy in addition to no appetite, taste changes, and thrush. Husband reports pt's thrush is improving but pt has been refusing nutritional supplements and does not like things that taste sweet. Pt ate a fruit cup, some melon, and drink some of her V8 this morning. Husband reports pt still sleeps most of the day. Daughter had expressed interest in artifical nutrition yesterday however MD did not think it was appropriate at this time. No new weights. Noted pt with low potassium and phosphorus today, magnesium WNL.   Height: Ht Readings from Last 1 Encounters:  04/10/13 5\' 2"  (1.575 m)    Weight Status:   Wt Readings from Last 1 Encounters:  04/10/13 155 lb (70.308 kg)    Re-estimated needs:  Kcal: 1800-2000  Protein: 85-95 g  Fluid: >2.0 L   Skin: incision on right groin 03/31/13   Diet Order: General   Intake/Output Summary (Last 24 hours) at 04/15/13 1002 Last data filed at 04/15/13 0900  Gross per 24 hour  Intake   1770 ml  Output   2050 ml  Net   -280 ml    Last BM: 9/13   Labs:   Recent Labs Lab 04/13/13 0354 04/14/13 0403 04/14/13 1735 04/15/13 0349  NA 134* 138 136 138  K 3.4* 3.4* 3.6 3.3*  CL 101 103 103 104  CO2 23 25 24 24   BUN 21 17 16 15   CREATININE 1.22* 1.17* 1.00 0.98  CALCIUM 11.5*  10.8* 10.1 9.7  MG 1.7 1.8  --  1.8  PHOS 2.1* 1.6*  --  1.7*  GLUCOSE 200* 235* 287* 246*    CBG (last 3)  No results found for this basename: GLUCAP,  in the last 72 hours  Scheduled Meds: . allopurinol  300 mg Oral BID  . antiseptic oral rinse  15 mL Mouth Rinse BID  . aspirin EC  81 mg Oral Daily  . bisoprolol  10 mg Oral Daily  . calcitonin  100 Units Subcutaneous Daily  . enoxaparin (LOVENOX) injection  40 mg Subcutaneous Q24H  . feeding supplement  237 mL Oral BID BM  . feeding supplement  1 Container Oral q morning - 10a  . fentaNYL      . midazolam      . nystatin  5 mL Oral QID  . pantoprazole  40 mg Oral Daily    Continuous Infusions: . sodium chloride 75 mL/hr at 04/14/13 [address] [place], [place], [place] [phone] Pager [phone] After Hours Pager

## 2013-04-15 NOTE — Progress Notes (Signed)
Patient remained febrile, unable to give blood transfusion as ordered, Dr Elisabeth Pigeon made aware, ordered septic work up and antibiotics started, family made aware.

## 2013-04-15 NOTE — Progress Notes (Signed)
ANTIBIOTIC CONSULT NOTE - INITIAL  Pharmacy Consult for:  Vancomycin, Zosyn Indication:  Fever  Allergies  Allergen Reactions  . Penicillins Rash    Patient Measurements: Height: 5\' 2"  (157.5 cm) Weight: 155 lb (70.308 kg) IBW/kg (Calculated) : 50.1  Vital Signs: Temp: 100.4 F (38 C) (09/17 1447) Temp src: Oral (09/17 1447) BP: 152/87 mmHg (09/17 1314) Pulse Rate: 114 (09/17 1314) Intake/Output from previous day: 09/16 0701 - 09/17 0700 In: 1770 [P.O.:1020; I.V.:750] Out: 2350 [Urine:2350] Intake/Output from this shift: Total I/O In: 880 [P.O.:180; I.V.:700] Out: 250 [Urine:250]  Labs:  Recent Labs  04/14/13 0403 04/14/13 1735 04/15/13 0349 04/15/13 1056  WBC  --   --  6.1  --   HGB  --   --  6.9* 6.3*  PLT  --   --  133*  --   CREATININE 1.17* 1.00 0.98  --    Estimated Creatinine Clearance: 48.4 ml/min (by C-G formula based on Cr of 0.98).    Microbiology: Recent Results (from the past 720 hour(s))  CULTURE, BLOOD (ROUTINE X 2)     Status: None   Collection Time    03/28/13 10:40 PM      Result Value Range Status   Specimen Description BLOOD LEFT ARM   Final   Special Requests BOTTLES DRAWN AEROBIC AND ANAEROBIC 10CC   Final   Culture  Setup Time     Final   Value: 03/29/2013 02:04     Performed at [facility]   Culture     Final   Value: NO GROWTH 5 DAYS     Performed at [facility]   Report Status 04/04/2013 FINAL   Final  CULTURE, BLOOD (ROUTINE X 2)     Status: None   Collection Time    03/28/13 10:50 PM      Result Value Range Status   Specimen Description BLOOD LEFT HAND   Final   Special Requests BOTTLES DRAWN AEROBIC ONLY 10CC   Final   Culture  Setup Time     Final   Value: 03/29/2013 02:04     Performed at [facility]   Culture     Final   Value: NO GROWTH 5 DAYS     Performed at [facility]   Report Status 04/04/2013 FINAL   Final  CULTURE, BLOOD (ROUTINE X 2)     Status: None   Collection  Time    04/10/13  8:05 PM      Result Value Range Status   Specimen Description BLOOD RIGHT ARM   Final   Special Requests BOTTLES DRAWN AEROBIC AND ANAEROBIC 10CC   Final   Culture  Setup Time     Final   Value: 04/11/2013 02:12     Performed at [facility]   Culture     Final   Value:        BLOOD CULTURE RECEIVED NO GROWTH TO DATE CULTURE WILL BE HELD FOR 5 DAYS BEFORE ISSUING A FINAL NEGATIVE REPORT     Performed at [facility]   Report Status PENDING   Incomplete  CULTURE, BLOOD (ROUTINE X 2)     Status: None   Collection Time    04/10/13  8:10 PM      Result Value Range Status   Specimen Description BLOOD RIGHT HAND   Final   Special Requests BOTTLES DRAWN AEROBIC AND ANAEROBIC 10CC   Final   Culture  Setup Time  Final   Value: 04/11/2013 02:12     Performed at [facility]   Culture     Final   Value:        BLOOD CULTURE RECEIVED NO GROWTH TO DATE CULTURE WILL BE HELD FOR 5 DAYS BEFORE ISSUING A FINAL NEGATIVE REPORT     Performed at [facility]   Report Status PENDING   Incomplete    Medical History: Past Medical History  Diagnosis Date  . Hypertension   . Renal insufficiency   . Osteopenia   . Allergy   . Heart murmur   . GERD (gastroesophageal reflux disease)   . Anemia   . Hypercalcemia 03/27/2013  . Non-Hodgkins lymphoma 04/06/2013  . Diastolic dysfunction, grade I 04/12/7828    Medications:  Scheduled:  . allopurinol  300 mg Oral BID  . antiseptic oral rinse  15 mL Mouth Rinse BID  . aspirin EC  81 mg Oral Daily  . bisoprolol  10 mg Oral Daily  . ceFEPime (MAXIPIME) IV  2 g Intravenous Q24H  . enoxaparin (LOVENOX) injection  40 mg Subcutaneous Q24H  . feeding supplement  237 mL Oral BID BM  . fentaNYL      . midazolam      . multivitamin with minerals  1 tablet Oral Daily  . nystatin  5 mL Oral QID  . pantoprazole  40 mg Oral Daily  . [START ON 04/16/2013] vancomycin  500 mg Intravenous Q12H  . vancomycin  750 mg  Intravenous Once   Assessment:  Asked to assist with antibiotic therapy for this female patient with non-Hodgkin's lymphoma and fever.    Due to penicillin allergy (rash), the original order for Zosyn has been changed to Cefepime.  The antibiotic doses will be modified for CrCl 48.4 ml/min.  Goals of Therapy:   Vancomycin trough levels 15-20 mcg/ml  Eradication of infection  Plan:   Vancomycin 750 mg x 1, then 500 mg every 12 hours.  Cefepime 2 grams  IV every 24 hours  [employer].Ph. 04/15/2013 4:14 PM

## 2013-04-15 NOTE — Procedures (Signed)
Technically successful CT guided bone marrow aspiration and biopsy of left iliac crest. No immediate complications. Awaiting pathology report.    

## 2013-04-16 ENCOUNTER — Telehealth: Payer: Self-pay

## 2013-04-16 DIAGNOSIS — R630 Anorexia: Secondary | ICD-10-CM

## 2013-04-16 LAB — URINALYSIS, ROUTINE W REFLEX MICROSCOPIC
Bilirubin Urine: NEGATIVE
Glucose, UA: NEGATIVE mg/dL
Protein, ur: 100 mg/dL — AB
Urobilinogen, UA: 1 mg/dL (ref 0.0–1.0)

## 2013-04-16 LAB — URINE MICROSCOPIC-ADD ON

## 2013-04-16 LAB — BASIC METABOLIC PANEL
BUN: 15 mg/dL (ref 6–23)
CO2: 23 mEq/L (ref 19–32)
Calcium: 9 mg/dL (ref 8.4–10.5)
GFR calc non Af Amer: 59 mL/min — ABNORMAL LOW (ref >90)
Glucose, Bld: 222 mg/dL — ABNORMAL HIGH (ref 70–99)
Sodium: 139 mEq/L (ref 135–145)

## 2013-04-16 LAB — TYPE AND SCREEN: Unit division: 0

## 2013-04-16 LAB — PHOSPHORUS: Phosphorus: 1.3 mg/dL — ABNORMAL LOW (ref 2.3–4.6)

## 2013-04-16 LAB — MAGNESIUM: Magnesium: 1.5 mg/dL (ref 1.5–2.5)

## 2013-04-16 MED ORDER — MAGNESIUM OXIDE 400 (241.3 MG) MG PO TABS
400.0000 mg | ORAL_TABLET | Freq: Two times a day (BID) | ORAL | Status: AC
  Administered 2013-04-16 (×2): 400 mg via ORAL
  Filled 2013-04-16 (×2): qty 1

## 2013-04-16 MED ORDER — ENOXAPARIN SODIUM 40 MG/0.4ML ~~LOC~~ SOLN
40.0000 mg | SUBCUTANEOUS | Status: DC
  Filled 2013-04-16: qty 0.4

## 2013-04-16 MED ORDER — LEVALBUTEROL HCL 0.63 MG/3ML IN NEBU
0.6300 mg | INHALATION_SOLUTION | Freq: Four times a day (QID) | RESPIRATORY_TRACT | Status: DC | PRN
  Administered 2013-04-16: 0.63 mg via RESPIRATORY_TRACT
  Filled 2013-04-16: qty 3

## 2013-04-16 MED ORDER — ALBUTEROL SULFATE (5 MG/ML) 0.5% IN NEBU
2.5000 mg | INHALATION_SOLUTION | Freq: Four times a day (QID) | RESPIRATORY_TRACT | Status: DC | PRN
  Administered 2013-04-16: 2.5 mg via RESPIRATORY_TRACT
  Filled 2013-04-16: qty 0.5

## 2013-04-16 MED ORDER — K PHOS MONO-SOD PHOS DI & MONO 155-852-130 MG PO TABS
500.0000 mg | ORAL_TABLET | Freq: Three times a day (TID) | ORAL | Status: AC
  Administered 2013-04-16 (×3): 500 mg via ORAL
  Filled 2013-04-16 (×3): qty 2

## 2013-04-16 MED ORDER — LEVALBUTEROL HCL 0.63 MG/3ML IN NEBU
0.6300 mg | INHALATION_SOLUTION | Freq: Three times a day (TID) | RESPIRATORY_TRACT | Status: DC
  Administered 2013-04-17 – 2013-04-20 (×10): 0.63 mg via RESPIRATORY_TRACT
  Filled 2013-04-16 (×18): qty 3

## 2013-04-16 NOTE — Progress Notes (Signed)
Medical Oncology  S: I spoke with the patient and her family including her daughter and husband.  They report she was more engaged but a bit combative yesterday evening.  Otherwise, she is eating very poorly.  She continues to spike fevers and she has been started on broad spectrum antibiotics.   O: BP 143/83  Pulse 111  Temp(Src) 98.7 F (37.1 C) (Oral)  Resp 20  Ht 5\' 2"  (1.575 m)  Wt 155 lb (70.308 kg)  BMI 28.34 kg/m2  SpO2 93% General: AAOX3.  Responds appropriately; edematous with puffy hands and feet. HEENT: PERRL; EOMi; dry MMM CVS: Tachy; S1S2 present;  Lungs: Coarse breath sounds with non-productive cough Abdomen: Soft, NT, slightly distended. +BS Ext: Edema 2+ Neuro: Moving all extremities.   Labs: CBC    Component Value Date/Time   WBC 6.1 04/15/2013 0349   WBC 6.6 04/10/2013 1205   RBC 2.94* 04/15/2013 0349   RBC 3.83 04/10/2013 1205   HGB 6.3* 04/15/2013 1056   HGB 9.0* 04/10/2013 1205   HCT 21.5* 04/15/2013 1056   HCT 29.7* 04/10/2013 1205   PLT 133* 04/15/2013 0349   PLT 214 04/10/2013 1205   MCV 78.2 04/15/2013 0349   MCV 77.5* 04/10/2013 1205   MCH 23.5* 04/15/2013 0349   MCH 23.5* 04/10/2013 1205   MCHC 30.0 04/15/2013 0349   MCHC 30.3* 04/10/2013 1205   RDW 17.9* 04/15/2013 0349   RDW 18.0* 04/10/2013 1205   LYMPHSABS 0.6* 04/10/2013 1205   LYMPHSABS 0.9 03/27/2013 1206   MONOABS 0.9 04/10/2013 1205   MONOABS 1.1* 03/27/2013 1206   EOSABS 0.0 04/10/2013 1205   EOSABS 0.0 03/27/2013 1206   BASOSABS 0.0 04/10/2013 1205   BASOSABS 0.0 03/27/2013 1206   CMP     Component Value Date/Time   NA 139 04/16/2013 0353   NA 137 04/10/2013 1205   K 3.5 04/16/2013 0353   K 4.1 04/10/2013 1205   CL 104 04/16/2013 0353   CO2 23 04/16/2013 0353   CO2 29 04/10/2013 1205   GLUCOSE 222* 04/16/2013 0353   GLUCOSE 157* 04/10/2013 1205   BUN 15 04/16/2013 0353   BUN 17.0 04/10/2013 1205   CREATININE 0.95 04/16/2013 0353   CREATININE 1.4* 04/10/2013 1205   CREATININE 1.37* 03/26/2013 1338   CALCIUM 9.0 04/16/2013 0353   CALCIUM 12.3* 04/10/2013 1205   PROT 6.1* 04/10/2013 1205   PROT 5.3* 03/31/2013 0600   ALBUMIN 1.7* 04/10/2013 1205   ALBUMIN 2.0* 03/31/2013 0600   AST 46* 04/10/2013 1205   AST 24 03/31/2013 0600   ALT 14 04/10/2013 1205   ALT 7 03/31/2013 0600   ALKPHOS 223* 04/10/2013 1205   ALKPHOS 120* 03/31/2013 0600   BILITOT 1.37* 04/10/2013 1205   BILITOT 0.6 03/31/2013 0600   GFRNONAA 59* 04/16/2013 0353   GFRAA 68* 04/16/2013 0353   Pathology (04/16/2013)  Bone Marrow c/w with DLBCL per preliminary report. Await final report.   Pathology (03/31/2013):  Lymph node for lymphoma, Right, Inguinal  - DIFFUSE LARGE B CELL LYMPHOMA  - SEE ONCOLOGY TABLE.  Microscopic Comment  LYMPHOMA  Histologic type: Non-Hodgkin's lymphoma, diffuse large cell type. Grade (if applicable): High grade.  Flow cytometry: A minor monoclonal B cell population with lambda light chain restriction. ([id number]).  Immunohistochemical stains: BCL-2, BCL-6, CD10, CD138, CD20, CD3, CD30, LCA, CD43, and CD79a with appropriate controls.  Touch preps/imprints: Abundance of large lymphoid cells with prominent nucleoli admixed with small lymphoid cells.  Comments: The sections show effacement of the  lymph nodal architecture by a diffuse relatively monomorphicpopulation of large lymphoid cells with vesicular chromatin, prominent nucleoli and eosinophilic to amphophilic cytoplasm. This is associated with brisk mitoses and areas of tumor necrosis. The appearance is mostly diffuse with lack of follicular of nodular structures. Flow cytometric analysis was performed ([id number]) and shows a minor population of monoclonal B cells displaying pan B cell antigens including CD20.  Immunohistochemical stains were performed and show that the large atypical lymphoid cells are positive for LCA, CD79a, CD20, BCL-2 and BCL-6. There is partial positivity for CD30. No appreciable positivity is seen with CD10 or CD138. CD3 and CD43 highlight  the admixed T cell component present in the background. The overall histologic and immunophenotypic features are consistent with diffuse large B cell lymphoma.  Specimen Gross and Clinical Information  Specimen(s) Obtained:  Lymph node for lymphoma, Right, Inguinal  1 of 2  FINAL for PHYNIX, HORTON ([id number])  Specimen Clinical Information  Right Inguinal Adenopathy (jmc)  Gross  Received fresh is a 3.4 x 2.8 x 2.5 cm encapsulated, rubbery, ovoid nodule. There is a separate, focally  disrupted 2.5 x 2.2 x 1.2 cm nodular portion of tissue. The cut surfaces are solid, tan-white with focal slight  hemorrhage. A portion of the specimen is placed in RPMI for flow cytometry and touch preparations are made  from the cut surfaces. Sections are submitted in three cassettes. (GRP:ecj 03/31/2013)  Stain(s) used in Diagnosis:  The following stain(s) were used in diagnosing the case: BCl 6 , CD 79a, CD 30, BCl 2, CD 138, CD 3, CD 43,  CD45 (LCA), CD-10, CD 20. The control(s) stained appropriately.    Impression and Plan:  1. DLBCL, high grade. Clinical stage IV with bone marrow involvement and  B symptoms (fevers and nightsweats).  Her International Prognostic index is 4 (NEJM, 1993) with a 3-year survival rate of 59% based on an elevated LDH, age greater than 36 years old, performance status of 2, Stage IV. She has extranodal involvement in the spleen and in the bone marrow.  We had a detailed discussion the patient and her family starting R-CHOP chemotherapy including the benefits of possibly controlling her disease and the side-effects of therapy including risks of bone marrow toxicity including but not limited to myelosuppression which can lead to the development of  life-threatening infections, nausea/vomiting, diarrhea, ananaphylaxis, neuropathy, cystitis, flu-like symptoms.   She understood these risk and agreed to proceed with chemotherapy.  We will start R-CHOP q 21 days for 6-8 cycles consisting of  rituximab, cyclophosphamide, doxorubicin, vincristine, prednisone.    2. Hypercalcemia. Resolving.  Likely secondary to #1.  Recommendations. -- Please place port ASAP and follow tumor lysis panel including basic metabolic panel, uric acid daily.   -- Continue prophylaxis with allopurinol.   -- Start R-CHO tommorow (without prednisone as she has already received 4 doses)  -- CNS prophylaxis will be considered with systemic methotrexate (3-3.5 g/m^2) or intrathecal methotrexate and/or cytarabine for 4- 8 doses based on her bone marrow with large cell lymphoma. An increase of CNS events has been noted.  -- Start neupogen on day #2.       Jaclyn Prime. Enas Winchel, MD,MS

## 2013-04-16 NOTE — Progress Notes (Addendum)
[facility] PROGRESS NOTE  Tonya Wise QIO:[id number] DOB: 06/13/1941 DOA: 04/10/2013 PCP: [name], MD  Brief narrative: 72 year old female with past medical history of recently diagnosed non-Hodgkin's lymphoma who was admitted to [facility] ED 04/10/2013 with dehydration, acute renal failure, hypercalcemia. Hospital course has been complicated due to lethargy and increasing confusion.   Assessment/Plan:   Principal Problem:  Hypercalcemia of malignancy  - Patient was given Pamidronate and Lasix 04/12/2013 as well as calcitonin with calcium now being within normal limits  - Calcitonin is now stopped   Active Problems:  Fever - possibly due to lymphoma but we obtained CXR which did show bibasilar pneumonia - continue vanco and cefepime until final blood cultures available Anemia of chronic disease  - Hemoglobin 6.3 on 04/15/2013 and pt received 1 unit PRBC; will follow up on hemoglobin today. Lethargy/metabolic encephalopathy  - Likely a combination of hypercalcemia and steroid effect. Steroids stopped. Calcium WNL but no significant changes in metntal status.  - Will continue to monitor mental status  - Urinalysis negative.  Thrush  - Started nystatin.  Hypokalemia / hypophosphatemia  - Repleting with Mag ox and K-Phos by mouth - continue to replace electrolytes Anorexia / severe protein calorie malnutrition  - continue nutritional supplements  - seen by nutritionist  Grade I diastolic dysfunction  - Stable  Acute renal failure  - Secondary to dehydration.  - Kidney function is now within normal limits. Resolved with IV fluids Non-Hodgkins lymphoma with fever/rigors, newly diagnosed  - Patient initially started on prednisone 04/11/2013. Due to the increasing lethargy and confusion prednisone on hold.  - Due to high risk for tumor lysis patient is on allopurinol.  started 04/11/2013. On allopurinol.  - Patient had bone marrow biopsy of left iliac crest done   04/15/2013 - follow up cytology results Elevated LFTs / hepatic steatosis  - Hepatic steatosis noted on CT abdomen/pelvis done 03/30/13.   Code Status: DNR/DNI  Family Communication: [name] (husband) [phone] (cell), [phone] (home). Husband updated at bedside.  Disposition Plan: Home when stable.    Medical Consultants:  Dr. Myra Rude, Oncology Other Consultants:  Dietitian Anti-infectives:  None.  Manson Passey, MD  [facility]  Pager [phone]   If 7PM-7AM, please contact night-coverage [email or web address] Password [facility] 04/16/2013, 6:40 AM   LOS: 6 days    HPI/Subjective: No acute overnight events.  Objective: Filed Vitals:   04/15/13 2215 04/15/13 2230 04/16/13 0410 04/16/13 0507  BP: 142/81 140/77  154/83  Pulse: 101 96  122  Temp: 98.9 F (37.2 C) 99.2 F (37.3 C)  98.5 F (36.9 C)  TempSrc: Oral Oral  Oral  Resp: 18 20  18   Height:      Weight:      SpO2: 94% 96% 92% 92%    Intake/Output Summary (Last 24 hours) at 04/16/13 0640 Last data filed at 04/16/13 2595  Gross per 24 hour  Intake 1877.5 ml  Output   1250 ml  Net  627.5 ml    Exam:   General:  Pt is sleeping, not in acute distress  Cardiovascular: Regular rate and rhythm, S1/S2 appreciated  Respiratory: no wheezing but some coarse breath sounds  Abdomen: Soft, non tender, non distended, bowel sounds present, no guarding  Extremities:  LE pitting edema, pulses DP and PT palpable bilaterally  Neuro: Grossly nonfocal  Data Reviewed: Basic Metabolic Panel:  Recent Labs Lab 04/12/13 0403 04/13/13 0354 04/14/13 0403 04/14/13 1735 04/15/13 0349 04/16/13 0353  NA 134* 134*  138 136 138 139  K 4.0 3.4* 3.4* 3.6 3.3* 3.5  CL 100 101 103 103 104 104  CO2 24 23 25 24 24 23   GLUCOSE 145* 200* 235* 287* 246* 222*  BUN 22 21 17 16 15 15   CREATININE 1.31* 1.22* 1.17* 1.00 0.98 0.95  CALCIUM 11.2* 11.5* 10.8* 10.1 9.7 9.0  MG 2.0 1.7 1.8  --  1.8 1.5  PHOS  --  2.1* 1.6*  --  1.7* 1.3*    Liver Function Tests:  Recent Labs Lab 04/10/13 1205  AST 46*  ALT 14  ALKPHOS 223*  BILITOT 1.37*  PROT 6.1*  ALBUMIN 1.7*   No results found for this basename: LIPASE, AMYLASE,  in the last 168 hours No results found for this basename: AMMONIA,  in the last 168 hours CBC:  Recent Labs Lab 04/10/13 1205 04/10/13 1644 04/12/13 0403 04/15/13 0349 04/15/13 1056  WBC 6.6 5.4 6.6 6.1  --   NEUTROABS 5.2  --   --   --   --   HGB 9.0* 8.8* 8.1* 6.9* 6.3*  HCT 29.7* 29.5* 25.9* 23.0* 21.5*  MCV 77.5* 78.0 77.5* 78.2  --   PLT 214 175 175 133*  --    Cardiac Enzymes: No results found for this basename: CKTOTAL, CKMB, CKMBINDEX, TROPONINI,  in the last 168 hours BNP: No components found with this basename: POCBNP,  CBG: No results found for this basename: GLUCAP,  in the last 168 hours  Recent Results (from the past 240 hour(s))  CULTURE, BLOOD (ROUTINE X 2)     Status: None   Collection Time    04/10/13  8:05 PM      Result Value Range Status   Specimen Description BLOOD RIGHT ARM   Final   Special Requests BOTTLES DRAWN AEROBIC AND ANAEROBIC 10CC   Final   Culture  Setup Time     Final   Value: 04/11/2013 02:12     Performed at [facility]   Culture     Final   Value:        BLOOD CULTURE RECEIVED NO GROWTH TO DATE CULTURE WILL BE HELD FOR 5 DAYS BEFORE ISSUING A FINAL NEGATIVE REPORT     Performed at [facility]   Report Status PENDING   Incomplete  CULTURE, BLOOD (ROUTINE X 2)     Status: None   Collection Time    04/10/13  8:10 PM      Result Value Range Status   Specimen Description BLOOD RIGHT HAND   Final   Special Requests BOTTLES DRAWN AEROBIC AND ANAEROBIC 10CC   Final   Culture  Setup Time     Final   Value: 04/11/2013 02:12     Performed at [facility]   Culture     Final   Value:        BLOOD CULTURE RECEIVED NO GROWTH TO DATE CULTURE WILL BE HELD FOR 5 DAYS BEFORE ISSUING A FINAL NEGATIVE REPORT     Performed at  [facility]   Report Status PENDING   Incomplete     Studies: Dg Chest 1 View  04/15/2013   CLINICAL DATA:  Fevers  EXAM: CHEST - 1 VIEW  COMPARISON:  04/10/2013  FINDINGS: Stable cardiac silhouette. There is increased bandlike densities in the right lower lobe. Linear thickening at the left lung base additionally. No pneumothorax. Lungs are hyperinflated.  IMPRESSION: Bibasilar linear bandlike opacities are concerning for pneumonia.  Electronically Signed   By: Genevive Bi M.D.   On: 04/15/2013 15:49   Ct Head Wo Contrast  04/14/2013   *RADIOLOGY REPORT*  Clinical Data: Lymphoma.  Lethargy  CT HEAD WITHOUT CONTRAST  Technique:  Contiguous axial images were obtained from the base of the skull through the vertex without contrast.  Comparison: MRI 11/26/2004.  CT 11/25/2004  Findings: Mild atrophy.  Negative for hydrocephalus.  Mild hypodensity in the periventricular white matter similar to the prior study.  No acute infarct.  Negative for hemorrhage or mass.  IMPRESSION: Mild atrophy and mild chronic changes in the white matter.  No acute abnormality.   Original Report Authenticated By: Janeece Riggers, M.D.   Ct Biopsy  04/15/2013   *RADIOLOGY REPORT*  Indication: New diagnosis of non-Hodgkin's lymphoma.  CT GUIDED LEFT ILIAC BONE MARROW ASPIRATION AND BONE MARROW CORE BIOPSIES  Intravenous medications: Fentanyl 50 mcg IV; Versed 0.5 mg IV  Sedation time: 10 minutes  Contrast volume: None  Complications: None immediate  PROCEDURE/FINDINGS:  Informed consent was obtained from the patient following an explanation of the procedure, risks, benefits and alternatives. The patient understands, agrees and consents for the procedure. All questions were addressed.  A time out was performed prior to the initiation of the procedure.  The patient was positioned prone and noncontrast localization CT was performed of the pelvis to demonstrate the iliac marrow spaces.  The operative site was prepped and  draped in the usual sterile fashion.  Under sterile conditions and local anesthesia, an 11 gauge coaxial bone biopsy needle was advanced into the left iliac marrow space. Needle position was confirmed with CT imaging.  Initially, bone marrow aspiration was performed. Next, a bone marrow biopsy was obtained with the 11 gauge outer bone marrow device. The 11 gauge coaxial bone biopsy needle was readvanced into a slightly different location within the left iliac marrow space, positioning was confirmed and an additional bone marrow biopsy was obtained. Samples were prepared with the cytotechnologist and deemed adequate.  The needle was removed intact.  Hemostasis was obtained with compression and a dressing was placed. The patient tolerated the procedure well without immediate post procedural complication.  IMPRESSION:  Successful CT guided left iliac bone marrow aspiration and core biopsies.   Original Report Authenticated By: Tacey Ruiz, MD    Scheduled Meds: . allopurinol  300 mg Oral BID  . antiseptic oral rinse  15 mL Mouth Rinse BID  . aspirin EC  81 mg Oral Daily  . bisoprolol  10 mg Oral Daily  . ceFEPime (MAXIPIME) IV  2 g Intravenous Q24H  . enoxaparin (LOVENOX) injection  40 mg Subcutaneous Q24H  . feeding supplement  237 mL Oral BID BM  . multivitamin with minerals  1 tablet Oral Daily  . nystatin  5 mL Oral QID  . pantoprazole  40 mg Oral Daily  . vancomycin  500 mg Intravenous Q12H   Continuous Infusions: . sodium chloride 75 mL/hr at 04/14/13 1635

## 2013-04-16 NOTE — Telephone Encounter (Signed)
Tonya Wise called asking about possible palliative care consult/goals of care. Pt has not been eating for about the last 6 days, she is asking about nutrition support or possible tube feeding. The pt has lost 6% of body weight in about 1 month. This request passed on to Dr Rosie Fate. Tonya Wise's pager is [phone].

## 2013-04-16 NOTE — Progress Notes (Signed)
Shift event: RN paged this NP secondary to tachypnea. O2 per [place] changed to 35% ventimask due to pt's mouth breathing. (sats were normal on [place]). Rapid response RN at bedside. NP to bedside. S: pt says she is somewhat SOB but always is at baseline. She denies any pain at present. Mentation at baseline per RN.  O: 72 yo WF who appears older than her stated age. Chronically ill appearing, not toxic. Alert. Responds immediately to name calling and is appropriate in answers. Vital signs reviewed. Febrile. HR in the 90-100 range, RRR. RR 24-28 without increased WOB. No use of accessory muscles. Lungs are very tight with fair to poor air exchange and minor wheezing. A/P: 1. Acute resp distress secondary to known bibasilar PNA. She appears comfortable and not toxic. Change Albuterol to Xopenex due to tachycardia. Give stat treatment. She is tight, but steroids have been d/c'd due to pt's persistent weakness, so we will try and hold off on giving a dose. I don't think she is hypercarbic or hypoxic because her mental status is at baseline and there is no noted use of accessory muscles. Will cont to monitor closely. She is a DNR/DNI, so will cont to watch on floor for now. No need for SDU transfer at this time.  2. Fever-PNA, on abx. Tylenol.  3. Lymphoma, known. Per hemoc.  Jimmye Norman, NP [facility]

## 2013-04-16 NOTE — Progress Notes (Addendum)
Called to room 1307 per floor RN for pt with worsening SOB, recent dx of pna and lymphoma, DNR. Pt found resting in bed, lethargic and appears comfortable. Awakens to voice and follows commands. Denies pain, but admits to mild SOB, falls asleep easily. Per floor RN pt has not changed from her original neuro assessment on days. Respirations 33, lungs congested with decreased sounds at bases bilat. HR 110s ST on RRT monitor po2 96 on VM, mouth breathing. Oral temp 101.5, RN to give tylenol.  Bp 143/84. NP Donnamarie Poag NP called to bedside, assessment done. Breathing treatment to change to xopenex due to HR. RN encouraged to call for worsening of pt condition.

## 2013-04-16 NOTE — Progress Notes (Signed)
Inpatient Diabetes Program Recommendations  AACE/ADA: New Consensus Statement on Inpatient Glycemic Control (2013)  Target Ranges:  Prepandial:   less than 140 mg/dL      Peak postprandial:   less than 180 mg/dL (1-2 hours)      Critically ill patients:  140 - 180 mg/dL   Reason for Assessment:  Hyperglycemia      Results for Tonya Wise, Tonya Wise (MRN [id number]) as of 04/16/2013 10:10  Ref. Range 04/13/2013 03:54 04/14/2013 04:03 04/14/2013 17:35 04/15/2013 03:49 04/16/2013 03:53  Glucose Latest Range: 70-99 mg/dL 782 (H) 956 (H) 213 (H) 246 (H) 222 (H)   Hyperglycemia  May benefit from addition of Novolog sensitive tidwc.  Note: Will continue to follow. Thank you. Ailene Ards, RD, LDN, CDE Inpatient Diabetes Coordinator [phone]

## 2013-04-17 ENCOUNTER — Encounter (HOSPITAL_COMMUNITY): Payer: Self-pay | Admitting: Radiology

## 2013-04-17 ENCOUNTER — Inpatient Hospital Stay (HOSPITAL_COMMUNITY): Payer: Medicare Other

## 2013-04-17 ENCOUNTER — Encounter (HOSPITAL_COMMUNITY): Payer: Medicare Other

## 2013-04-17 DIAGNOSIS — C801 Malignant (primary) neoplasm, unspecified: Secondary | ICD-10-CM | POA: Diagnosis not present

## 2013-04-17 DIAGNOSIS — R7989 Other specified abnormal findings of blood chemistry: Secondary | ICD-10-CM

## 2013-04-17 LAB — CULTURE, BLOOD (ROUTINE X 2): Culture: NO GROWTH

## 2013-04-17 LAB — BASIC METABOLIC PANEL
BUN: 18 mg/dL (ref 6–23)
Chloride: 105 mEq/L (ref 96–112)
Creatinine, Ser: 1.01 mg/dL (ref 0.50–1.10)
GFR calc Af Amer: 63 mL/min — ABNORMAL LOW (ref >90)
GFR calc non Af Amer: 55 mL/min — ABNORMAL LOW (ref >90)
Potassium: 3.5 mEq/L (ref 3.5–5.1)

## 2013-04-17 LAB — URIC ACID: Uric Acid, Serum: 1.2 mg/dL — ABNORMAL LOW (ref 2.4–7.0)

## 2013-04-17 LAB — CBC
HCT: 27.2 % — ABNORMAL LOW (ref 36.0–46.0)
Hemoglobin: 8.2 g/dL — ABNORMAL LOW (ref 12.0–15.0)
MCH: 23.8 pg — ABNORMAL LOW (ref 26.0–34.0)
MCV: 79.1 fL (ref 78.0–100.0)
RBC: 3.44 MIL/uL — ABNORMAL LOW (ref 3.87–5.11)
WBC: 8.7 10*3/uL (ref 4.0–10.5)

## 2013-04-17 MED ORDER — MIDAZOLAM HCL 2 MG/2ML IJ SOLN
INTRAMUSCULAR | Status: AC
  Filled 2013-04-17: qty 6

## 2013-04-17 MED ORDER — ENOXAPARIN SODIUM 40 MG/0.4ML ~~LOC~~ SOLN
40.0000 mg | SUBCUTANEOUS | Status: DC
  Administered 2013-04-18 – 2013-04-20 (×3): 40 mg via SUBCUTANEOUS
  Filled 2013-04-17 (×4): qty 0.4

## 2013-04-17 MED ORDER — FENTANYL CITRATE 0.05 MG/ML IJ SOLN
INTRAMUSCULAR | Status: AC | PRN
  Administered 2013-04-17 (×2): 50 ug via INTRAVENOUS

## 2013-04-17 MED ORDER — FENTANYL CITRATE 0.05 MG/ML IJ SOLN
INTRAMUSCULAR | Status: AC
  Filled 2013-04-17: qty 6

## 2013-04-17 MED ORDER — MIDAZOLAM HCL 2 MG/2ML IJ SOLN
INTRAMUSCULAR | Status: AC | PRN
  Administered 2013-04-17 (×2): 1 mg via INTRAVENOUS

## 2013-04-17 MED ORDER — LIDOCAINE HCL 1 % IJ SOLN
INTRAMUSCULAR | Status: AC
  Filled 2013-04-17: qty 20

## 2013-04-17 MED ORDER — HEPARIN SOD (PORK) LOCK FLUSH 100 UNIT/ML IV SOLN
INTRAVENOUS | Status: AC | PRN
  Administered 2013-04-17: 500 [IU]

## 2013-04-17 MED ORDER — LIDOCAINE-EPINEPHRINE (PF) 2 %-1:200000 IJ SOLN
INTRAMUSCULAR | Status: AC
  Filled 2013-04-17: qty 20

## 2013-04-17 MED ORDER — FLUCONAZOLE IN SODIUM CHLORIDE 200-0.9 MG/100ML-% IV SOLN
200.0000 mg | INTRAVENOUS | Status: AC
  Administered 2013-04-17 – 2013-04-23 (×7): 200 mg via INTRAVENOUS
  Filled 2013-04-17 (×7): qty 100

## 2013-04-17 NOTE — Progress Notes (Addendum)
[facility] PROGRESS NOTE  Tonya Wise RUE:[id number] DOB: 1940-11-07 DOA: 04/10/2013 PCP: [name], MD  Brief narrative: 72 year old female with past medical history of recently diagnosed non-Hodgkin's lymphoma who was admitted to [facility] ED 04/10/2013 with dehydration, acute renal failure, hypercalcemia. Hospital course has been complicated due to lethargy and increasing confusion. Pt now looks better and plan per oncology is to start chemotherapy today.  Assessment/Plan:   Principal Problem:  Non-Hodgkins lymphoma with fever/rigors, newly diagnosed  - Patient initially started on prednisone 04/11/2013. Due to the increasing lethargy and confusion prednisone was put on hold.  - Patient had bone marrow biopsy of left iliac crest done 04/15/2013 - follow up cytology results - plan per oncology is to start chemotherapy today; continue allopurinol (high risk of tumor lysis syndrome) - port-a-cath placement today  Active Problems:  Hypercalcemia of malignancy  - Patient was given Pamidronate and Lasix 04/12/2013 as well as calcitonin with calcium now being within normal limits  - Calcitonin is now stopped  Hospital acquired pneumonia - based on  CXR which show bibasilar pneumonia  - continue vanco and cefepime until final blood cultures available; prelim blood cultures show no growth Anemia of chronic disease  - Hemoglobin 6.3 on 04/15/2013 and pt received 1 unit PRBC Lethargy/metabolic encephalopathy  - Likely a combination of hypercalcemia and steroid effect. Steroids stopped but will be used as part of chemotx.  - mental status better this am - Urinalysis negative. Chest x-ray negative on admission but repeat showed pneumonia. Thrush  - Started nystatin. Start fluconazole, pt still has thrush Hypokalemia / hypophosphatemia  - Repleted with Mag ox and K-Phos by mouth  - continue to replace electrolytes as needed Anorexia / severe protein calorie malnutrition  - continue  nutritional supplements  - seen by nutritionist  Grade I diastolic dysfunction  - Stable  Acute renal failure  - Secondary to dehydration.  - Kidney function is now within normal limits. Resolved with IV fluids   Elevated LFTs / hepatic steatosis  - Hepatic steatosis noted on CT abdomen/pelvis done 03/30/13.    Code Status: DNR/DNI  Family Communication: [name] (husband) [phone] (cell), [phone] (home). Husband updated at bedside.  Disposition Plan: Home when stable.   Medical Consultants:  Dr. Myra Rude, Oncology Other Consultants:  Dietitian Anti-infectives:  None.  Manson Passey, MD  [facility]  Pager [phone]   If 7PM-7AM, please contact night-coverage [email or web address] Password [email or web address] 04/17/2013, 1:00 PM   LOS: 7 days    HPI/Subjective: Looks better this am.  Objective: Filed Vitals:   04/17/13 0450 04/17/13 0603 04/17/13 0800 04/17/13 1035  BP:  132/79    Pulse: 120 119    Temp: 99.9 F (37.7 C) 98.6 F (37 C)    TempSrc: Oral Oral    Resp: 26 25    Height:      Weight:    80.1 kg (176 lb 9.4 oz)  SpO2: 94% 95% 94%     Intake/Output Summary (Last 24 hours) at 04/17/13 1300 Last data filed at 04/17/13 0844  Gross per 24 hour  Intake 4940.66 ml  Output    751 ml  Net 4189.66 ml    Exam:   General:  Pt is alert, follows commands appropriately, not in acute distress  Cardiovascular: Regular rate and rhythm, S1/S2, no murmurs, no rubs, no gallops  Respiratory: Clear to auscultation bilaterally, no wheezing, no crackles, no rhonchi  Abdomen: Soft, non tender, non distended, bowel sounds present, no guarding  Extremities: LE (+1) pitting edema, pulses DP and PT palpable bilaterally  Neuro: Grossly nonfocal  Data Reviewed: Basic Metabolic Panel:  Recent Labs Lab 04/13/13 0354 04/14/13 0403 04/14/13 1735 04/15/13 0349 04/16/13 0353 04/17/13 0450  NA 134* 138 136 138 139 140  K 3.4* 3.4* 3.6 3.3* 3.5 3.5  CL 101 103 103 104 104 105   CO2 23 25 24 24 23 22   GLUCOSE 200* 235* 287* 246* 222* 218*  BUN 21 17 16 15 15 18   CREATININE 1.22* 1.17* 1.00 0.98 0.95 1.01  CALCIUM 11.5* 10.8* 10.1 9.7 9.0 8.5  MG 1.7 1.8  --  1.8 1.5 1.6  PHOS 2.1* 1.6*  --  1.7* 1.3* 2.4   Liver Function Tests: No results found for this basename: AST, ALT, ALKPHOS, BILITOT, PROT, ALBUMIN,  in the last 168 hours No results found for this basename: LIPASE, AMYLASE,  in the last 168 hours No results found for this basename: AMMONIA,  in the last 168 hours CBC:  Recent Labs Lab 04/10/13 1644 04/12/13 0403 04/15/13 0349 04/15/13 1056 04/17/13 0450  WBC 5.4 6.6 6.1  --  8.7  HGB 8.8* 8.1* 6.9* 6.3* 8.2*  HCT 29.5* 25.9* 23.0* 21.5* 27.2*  MCV 78.0 77.5* 78.2  --  79.1  PLT 175 175 133*  --  112*   Cardiac Enzymes: No results found for this basename: CKTOTAL, CKMB, CKMBINDEX, TROPONINI,  in the last 168 hours BNP: No components found with this basename: POCBNP,  CBG: No results found for this basename: GLUCAP,  in the last 168 hours  Recent Results (from the past 240 hour(s))  CULTURE, BLOOD (ROUTINE X 2)     Status: None   Collection Time    04/10/13  8:05 PM      Result Value Range Status   Specimen Description BLOOD RIGHT ARM   Final   Special Requests BOTTLES DRAWN AEROBIC AND ANAEROBIC 10CC   Final   Culture  Setup Time     Final   Value: 04/11/2013 02:12     Performed at [facility]   Culture     Final   Value: NO GROWTH 5 DAYS     Performed at [facility]   Report Status 04/17/2013 FINAL   Final  CULTURE, BLOOD (ROUTINE X 2)     Status: None   Collection Time    04/10/13  8:10 PM      Result Value Range Status   Specimen Description BLOOD RIGHT HAND   Final   Special Requests BOTTLES DRAWN AEROBIC AND ANAEROBIC 10CC   Final   Culture  Setup Time     Final   Value: 04/11/2013 02:12     Performed at [facility]   Culture     Final   Value: NO GROWTH 5 DAYS     Performed at [facility]   Report Status 04/17/2013 FINAL   Final  CULTURE, BLOOD (ROUTINE X 2)     Status: None   Collection Time    04/15/13  3:43 PM      Result Value Range Status   Specimen Description BLOOD LEFT HAND   Final   Special Requests BOTTLES DRAWN AEROBIC ONLY 3CC   Final   Culture  Setup Time     Final   Value: 04/16/2013 03:42     Performed at [facility]   Culture     Final   Value:  BLOOD CULTURE RECEIVED NO GROWTH TO DATE CULTURE WILL BE HELD FOR 5 DAYS BEFORE ISSUING A FINAL NEGATIVE REPORT     Performed at [facility]   Report Status PENDING   Incomplete  CULTURE, BLOOD (ROUTINE X 2)     Status: None   Collection Time    04/15/13  3:47 PM      Result Value Range Status   Specimen Description BLOOD RIGHT HAND   Final   Special Requests BOTTLES DRAWN AEROBIC ONLY 3CC   Final   Culture  Setup Time     Final   Value: 04/16/2013 03:42     Performed at [facility]   Culture     Final   Value:        BLOOD CULTURE RECEIVED NO GROWTH TO DATE CULTURE WILL BE HELD FOR 5 DAYS BEFORE ISSUING A FINAL NEGATIVE REPORT     Performed at [facility]   Report Status PENDING   Incomplete     Studies: Dg Chest 1 View 04/15/2013   IMPRESSION: Bibasilar linear bandlike opacities are concerning for pneumonia.   Electronically Signed   By: Genevive Bi M.D.   On: 04/15/2013 15:49    Scheduled Meds: . allopurinol  300 mg Oral BID  . aspirin EC  81 mg Oral Daily  . bisoprolol  10 mg Oral Daily  . ceFEPime (MAXIPIME) IV  2 g Intravenous Q24H  . enoxaparin (LOVENOX)   40 mg Subcutaneous Q24H  . levalbuterol  0.63 mg Nebulization TID  . multivitamin   1 tablet Oral Daily  . nystatin  5 mL Oral QID  . pantoprazole  40 mg Oral Daily  . vancomycin  500 mg Intravenous Q12H

## 2013-04-17 NOTE — Plan of Care (Signed)
Problem: Phase II Progression Outcomes Goal: Vital signs remain stable Outcome: Not Met (add Reason) Patient with tachypnea, SOB, audible wheezes.

## 2013-04-17 NOTE — H&P (Signed)
Agree 

## 2013-04-17 NOTE — Progress Notes (Signed)
PT Cancellation Note  Patient Details Name: LEANNDRA PEMBER MRN: [id number] DOB: [birth year]   Cancelled Treatment:    Reason Eval/Treat Not Completed: Fatigue/lethargy limiting ability to participate  Pt waiting to go for portacath insertion in anticipation of chemotherapy tomorrow.  Pt defers PT at this time.  Will check back another day   Donnetta Hail 04/17/2013, 4:01 PM

## 2013-04-17 NOTE — Procedures (Signed)
RIJV PAC SVC RA No comp 

## 2013-04-17 NOTE — H&P (Signed)
Referring Physician: Dr. Rosie Fate HPI: Tonya Wise is an 72 y.o. female with PMHx of NHL, patient was seen by medical oncology and has agreed to proceed with treatment. Request for port catheter placement was received. The patient also with PNA on vancomycin and cefepime, wbc 8.7 and afebrile. She has anemia of chronic disease and has had transfusion recently with h/h status improving. She denies any chest pain or change in her chronic shortness of breath. The family states they feel her breathing is better today and less labored. She denies any blood in her stool or urine.   Past Medical History:  Past Medical History  Diagnosis Date  . Hypertension   . Renal insufficiency   . Osteopenia   . Allergy   . Heart murmur   . GERD (gastroesophageal reflux disease)   . Anemia   . Hypercalcemia 03/27/2013  . Non-Hodgkins lymphoma 04/06/2013  . Diastolic dysfunction, grade I 03/27/5620    Past Surgical History:  Past Surgical History  Procedure Laterality Date  . Appendectomy    . Colonoscopy  2008  . Spine surgery    . Back surgery      LOWER BACK TWICE  . Abdominal hysterectomy    . Esophagogastroduodenoscopy N/A 03/29/2013    Procedure: ESOPHAGOGASTRODUODENOSCOPY (EGD);  Surgeon: Charna Elizabeth, MD;  Location: [facility] ENDOSCOPY;  Service: Endoscopy;  Laterality: N/A;  . Inguinal lymph node biopsy Right 03/31/2013    Procedure: INGUINAL LYMPH NODE BIOPSY;  Surgeon: Cherylynn Ridges, MD;  Location: [facility] OR;  Service: General;  Laterality: Right;    Family History:  Family History  Problem Relation Age of Onset  . Heart attack Father 40  . Leukemia Mother   . Diabetes Brother   . Diabetes Sister     Social History:  reports that she quit smoking about 52 years ago. Her smoking use included Cigarettes. She has a 1 pack-year smoking history. She has never used smokeless tobacco. She reports that  drinks alcohol. She reports that she does not use illicit drugs.  Allergies:  Allergies  Allergen  Reactions  . Penicillins Rash      Medication List    ASK your doctor about these medications       acetaminophen 325 MG tablet  Commonly known as:  TYLENOL  Take 2 tablets (650 mg total) by mouth every 6 (six) hours as needed.     allopurinol 300 MG tablet  Commonly known as:  ZYLOPRIM  Take 300 mg by mouth 2 (two) times daily.     aspirin 81 MG tablet  Take 1 tablet (81 mg total) by mouth daily.     bisoprolol 10 MG tablet  Commonly known as:  ZEBETA  Take 1 tablet (10 mg total) by mouth daily.     omeprazole 40 MG capsule  Commonly known as:  PRILOSEC  Take 1 capsule (40 mg total) by mouth daily.     oxyCODONE 5 MG immediate release tablet  Commonly known as:  Oxy IR/ROXICODONE  Take 1 tablet (5 mg total) by mouth every 6 (six) hours as needed.        Please HPI for pertinent positives, otherwise complete 10 system ROS negative.  Physical Exam: BP 132/79  Pulse 119  Temp(Src) 98.6 F (37 C) (Oral)  Resp 25  Ht 5\' 2"  (1.575 m)  Wt 176 lb 9.4 oz (80.1 kg)  BMI 32.29 kg/m2  SpO2 94% Body mass index is 32.29 kg/(m^2).   General Appearance:  Alert,  cooperative, no distress.  Head:  Normocephalic, without obvious abnormality, atraumatic  Neck: Supple, symmetrical, trachea midline  Lungs:   Clear to auscultation bilaterally, no w/r/r, respirations unlabored without use of accessory muscles.  Chest Wall:  No tenderness or deformity  Heart:  Regular rate and rhythm, S1, S2 normal, no murmur, rub or gallop.  Abdomen:   Soft, non-tender, non distended.  Extremities: Extremities normal, atraumatic, no cyanosis or edema  Pulses: 1+ and symmetric  Neurologic: Normal affect, no gross deficits.   Results for orders placed during the hospital encounter of 04/10/13 (from the past 48 hour(s))  CULTURE, BLOOD (ROUTINE X 2)     Status: None   Collection Time    04/15/13  3:43 PM      Result Value Range   Specimen Description BLOOD LEFT HAND     Special Requests BOTTLES  DRAWN AEROBIC ONLY 3CC     Culture  Setup Time       Value: 04/16/2013 03:42     Performed at [facility]   Culture       Value:        BLOOD CULTURE RECEIVED NO GROWTH TO DATE CULTURE WILL BE HELD FOR 5 DAYS BEFORE ISSUING A FINAL NEGATIVE REPORT     Performed at [facility]   Report Status PENDING    CULTURE, BLOOD (ROUTINE X 2)     Status: None   Collection Time    04/15/13  3:47 PM      Result Value Range   Specimen Description BLOOD RIGHT HAND     Special Requests BOTTLES DRAWN AEROBIC ONLY 3CC     Culture  Setup Time       Value: 04/16/2013 03:42     Performed at [facility]   Culture       Value:        BLOOD CULTURE RECEIVED NO GROWTH TO DATE CULTURE WILL BE HELD FOR 5 DAYS BEFORE ISSUING A FINAL NEGATIVE REPORT     Performed at [facility]   Report Status PENDING    BASIC METABOLIC PANEL     Status: Abnormal   Collection Time    04/16/13  3:53 AM      Result Value Range   Sodium 139  135 - 145 mEq/L   Potassium 3.5  3.5 - 5.1 mEq/L   Chloride 104  96 - 112 mEq/L   CO2 23  19 - 32 mEq/L   Glucose, Bld 222 (*) 70 - 99 mg/dL   BUN 15  6 - 23 mg/dL   Creatinine, Ser 2.13  0.50 - 1.10 mg/dL   Calcium 9.0  8.4 - 08.6 mg/dL   GFR calc non Af Amer 59 (*) >90 mL/min   GFR calc Af Amer 68 (*) >90 mL/min   Comment: (NOTE)     The eGFR has been calculated using the CKD EPI equation.     This calculation has not been validated in all clinical situations.     eGFR's persistently <90 mL/min signify possible Chronic Kidney     Disease.  URIC ACID     Status: Abnormal   Collection Time    04/16/13  3:53 AM      Result Value Range   Uric Acid, Serum 1.4 (*) 2.4 - 7.0 mg/dL  PHOSPHORUS     Status: Abnormal   Collection Time    04/16/13  3:53 AM      Result Value Range  Phosphorus 1.3 (*) 2.3 - 4.6 mg/dL  MAGNESIUM     Status: None   Collection Time    04/16/13  3:53 AM      Result Value Range   Magnesium 1.5  1.5 - 2.5 mg/dL   URINALYSIS, ROUTINE W REFLEX MICROSCOPIC     Status: Abnormal   Collection Time    04/16/13  5:16 AM      Result Value Range   Color, Urine AMBER (*) YELLOW   Comment: BIOCHEMICALS MAY BE AFFECTED BY COLOR   APPearance CLOUDY (*) CLEAR   Specific Gravity, Urine 1.024  1.005 - 1.030   pH 6.0  5.0 - 8.0   Glucose, UA NEGATIVE  NEGATIVE mg/dL   Hgb urine dipstick TRACE (*) NEGATIVE   Bilirubin Urine NEGATIVE  NEGATIVE   Ketones, ur NEGATIVE  NEGATIVE mg/dL   Protein, ur 829 (*) NEGATIVE mg/dL   Urobilinogen, UA 1.0  0.0 - 1.0 mg/dL   Nitrite NEGATIVE  NEGATIVE   Leukocytes, UA NEGATIVE  NEGATIVE  URINE MICROSCOPIC-ADD ON     Status: Abnormal   Collection Time    04/16/13  5:16 AM      Result Value Range   Squamous Epithelial / LPF RARE  RARE   WBC, UA 0-2  <3 WBC/hpf   RBC / HPF 0-2  <3 RBC/hpf   Casts GRANULAR CAST (*) NEGATIVE  HEPATITIS B SURFACE ANTIBODY     Status: None   Collection Time    04/16/13  6:12 PM      Result Value Range   Hep B S Ab NEGATIVE  NEGATIVE   Comment: Performed at [facility]  HEPATITIS B SURFACE ANTIGEN     Status: None   Collection Time    04/16/13  6:12 PM      Result Value Range   Hepatitis B Surface Ag NEGATIVE  NEGATIVE   Comment: Performed at [facility]  BASIC METABOLIC PANEL     Status: Abnormal   Collection Time    04/17/13  4:50 AM      Result Value Range   Sodium 140  135 - 145 mEq/L   Potassium 3.5  3.5 - 5.1 mEq/L   Chloride 105  96 - 112 mEq/L   CO2 22  19 - 32 mEq/L   Glucose, Bld 218 (*) 70 - 99 mg/dL   BUN 18  6 - 23 mg/dL   Creatinine, Ser 5.62  0.50 - 1.10 mg/dL   Calcium 8.5  8.4 - 13.0 mg/dL   GFR calc non Af Amer 55 (*) >90 mL/min   GFR calc Af Amer 63 (*) >90 mL/min   Comment: (NOTE)     The eGFR has been calculated using the CKD EPI equation.     This calculation has not been validated in all clinical situations.     eGFR's persistently <90 mL/min signify possible Chronic Kidney     Disease.   URIC ACID     Status: Abnormal   Collection Time    04/17/13  4:50 AM      Result Value Range   Uric Acid, Serum 1.2 (*) 2.4 - 7.0 mg/dL  PHOSPHORUS     Status: None   Collection Time    04/17/13  4:50 AM      Result Value Range   Phosphorus 2.4  2.3 - 4.6 mg/dL  MAGNESIUM     Status: None   Collection Time    04/17/13  4:50  AM      Result Value Range   Magnesium 1.6  1.5 - 2.5 mg/dL  CBC     Status: Abnormal   Collection Time    04/17/13  4:50 AM      Result Value Range   WBC 8.7  4.0 - 10.5 K/uL   RBC 3.44 (*) 3.87 - 5.11 MIL/uL   Hemoglobin 8.2 (*) 12.0 - 15.0 g/dL   Comment: RESULT REPEATED AND VERIFIED     DELTA CHECK NOTED     POST TRANSFUSION SPECIMEN   HCT 27.2 (*) 36.0 - 46.0 %   MCV 79.1  78.0 - 100.0 fL   MCH 23.8 (*) 26.0 - 34.0 pg   MCHC 30.1  30.0 - 36.0 g/dL   RDW 16.1 (*) 09.6 - 04.5 %   Platelets 112 (*) 150 - 400 K/uL   Comment: SPECIMEN CHECKED FOR CLOTS     REPEATED TO VERIFY     PLATELET COUNT CONFIRMED BY SMEAR   Dg Chest 1 View  04/15/2013   CLINICAL DATA:  Fevers  EXAM: CHEST - 1 VIEW  COMPARISON:  04/10/2013  FINDINGS: Stable cardiac silhouette. There is increased bandlike densities in the right lower lobe. Linear thickening at the left lung base additionally. No pneumothorax. Lungs are hyperinflated.  IMPRESSION: Bibasilar linear bandlike opacities are concerning for pneumonia.   Electronically Signed   By: Genevive Bi M.D.   On: 04/15/2013 15:49    Assessment/Plan Non-hodgkin's Lymphoma. Request for port-a-catheter placement. PNA on vancomycin and Cefepime. Blood cultures show no growth, wbc 8.7 Anemia of chronic disease s/p 1 unit PRBC hgb 8.2 (6.3), hct 27.2 (21.5) Labs reviewed, patient has been NPO. Risks and Benefits discussed with the patient and her family. All of the patient's questions were answered, patient is agreeable to proceed. Consent signed and in chart.   Pattricia Boss D PA-C 04/17/2013, 11:28 AM

## 2013-04-17 NOTE — Progress Notes (Signed)
NUTRITION FOLLOW UP  Intervention:   - Do not expect pt's PO intake and nutritional status to improve anytime soon, pt on day 7 of inadequate oral intake with pt consuming nothing all day yesterday. Continue to recommend enteral nutrition especially as pt planning to get chemotherapy. 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  - Recommend nursing get updated weight - D/C Glucerna shakes as pt not drinking them - Will continue to monitor   Nutrition Dx:   Inadequate oral intake related to NH lymphoma as evidenced by reported weight loss and intake less than estimated needs - ongoing    Goal:   Pt to meet >/= 90% of their estimated nutrition needs - not met   Monitor:   Weights, labs, intake, goals of care, TF  Assessment:   Met with pt and daughter who report pt with ongoing minimal intake - only ate a fruit cup on Wednesday, nothing at all on Thursday, and is NPO this morning. Pt does not like Ensure, tried Glucerna shake as she preferred something less sweet, but only took 1 sip earlier in the week. Pt sleeping this morning. Had discussion with oncologist yesterday regarding recommendations for enteral nutrition/palliative care consult, said he would need to see pt first and talk to family.   Noted pt with rapid response last night due to tachypnea, decided there was no need for SDU transfer at this time. Pt DNR/DNI. Noted plans to start chemotherapy.   Height: Ht Readings from Last 1 Encounters:  04/10/13 5\' 2"  (1.575 m)    Weight Status:   Wt Readings from Last 1 Encounters:  04/10/13 155 lb (70.308 kg)    Re-estimated needs:  Kcal: 1800-2000  Protein: 85-95 g  Fluid: >2.0 L   Skin: incision on right groin 03/31/13   Diet Order: NPO   Intake/Output Summary (Last 24 hours) at  04/17/13 0943 Last data filed at 04/17/13 0511  Gross per 24 hour  Intake 4940.66 ml  Output   1050 ml  Net 3890.66 ml    Last BM: 9/19   Labs:   Recent Labs Lab 04/15/13 0349 04/16/13 0353 04/17/13 0450  NA 138 139 140  K 3.3* 3.5 3.5  CL 104 104 105  CO2 24 23 22   BUN 15 15 18   CREATININE 0.98 0.95 1.01  CALCIUM 9.7 9.0 8.5  MG 1.8 1.5 1.6  PHOS 1.7* 1.3* 2.4  GLUCOSE 246* 222* 218*    CBG (last 3)  No results found for this basename: GLUCAP,  in the last 72 hours  Scheduled Meds: . allopurinol  300 mg Oral BID  . antiseptic oral rinse  15 mL Mouth Rinse BID  . aspirin EC  81 mg Oral Daily  . bisoprolol  10 mg Oral Daily  . ceFEPime (MAXIPIME) IV  2 g Intravenous Q24H  . enoxaparin (LOVENOX) injection  40 mg Subcutaneous Q24H  . feeding supplement  237 mL Oral BID BM  . levalbuterol  0.63 mg Nebulization TID  . multivitamin with minerals  1 tablet Oral Daily  . nystatin  5 mL Oral QID  . pantoprazole  40 mg Oral Daily  . vancomycin  500 mg Intravenous Q12H    Continuous Infusions: . sodium chloride 10 mL/hr (04/16/13 1436)     Levon Hedger MS, RD,  LDN [phone] Pager [phone] After Hours Pager

## 2013-04-17 NOTE — Consult Note (Signed)
MEDICAL ONCOLOGY - Follow-up Consultation  S:  Patient awaits for Port-placement today.  Husband at bedside.  Overnight events noted.  This am she was up to the bedside commode.  She remains of antibiotics. Her mental status is much approved.   O: Patient Vitals for the past 24 hrs:  BP Temp Temp src Pulse Resp SpO2 Weight  04/17/13 1406 136/81 mmHg 98.9 F (37.2 C) Oral 111 20 94 % -  04/17/13 1035 - - - - - - 176 lb 9.4 oz (80.1 kg)  04/17/13 0800 - - - - - 94 % -  04/17/13 0603 132/79 mmHg 98.6 F (37 C) Oral 119 25 95 % -  04/17/13 0450 - 99.9 F (37.7 C) Oral 120 26 94 % -  04/17/13 0400 - - - - 25 - -  04/17/13 0150 - - - 108 26 96 % -  04/16/13 2250 133/79 mmHg 98.2 F (36.8 C) Oral 109 26 95 % -  04/16/13 2205 - - - 110 26 97 % -  04/16/13 2115 - - - 115 34 97 % -  04/16/13 2105 148/88 mmHg 101.5 F (38.6 C) Oral 118 32 96 % -  04/16/13 2030 - - - 116 - 96 % -  04/16/13 2020 - - - 116 - - -  04/16/13 2019 148/90 mmHg 99.8 F (37.7 C) Oral 117 36 93 % -    Scheduled Meds: . allopurinol  300 mg Oral BID  . antiseptic oral rinse  15 mL Mouth Rinse BID  . aspirin EC  81 mg Oral Daily  . bisoprolol  10 mg Oral Daily  . ceFEPime (MAXIPIME) IV  2 g Intravenous Q24H  . [START ON 04/18/2013] enoxaparin (LOVENOX) injection  40 mg Subcutaneous Q24H  . fentaNYL      . fluconazole (DIFLUCAN) IV  200 mg Intravenous Q24H  . levalbuterol  0.63 mg Nebulization TID  . lidocaine      . lidocaine-EPINEPHrine      . midazolam      . multivitamin with minerals  1 tablet Oral Daily  . nystatin  5 mL Oral QID  . pantoprazole  40 mg Oral Daily  . vancomycin  500 mg Intravenous Q12H   Continuous Infusions: . sodium chloride 10 mL/hr (04/16/13 1436)   PRN Meds:.acetaminophen, acetaminophen, alum & mag hydroxide-simeth, fentaNYL, guaiFENesin-dextromethorphan, midazolam, morphine injection, ondansetron (ZOFRAN) IV, ondansetron, oxyCODONE, polyethylene glycol  PE: General: Generalized  edema; AAOx 3. Denies pain. HEENT: Healing abrasion on upper left lip. PERRL: EOMi   Lab Results  Component Value Date   WBC 8.7 04/17/2013   HGB 8.2* 04/17/2013   HCT 27.2* 04/17/2013   PLT 112* 04/17/2013   GLUCOSE 218* 04/17/2013   ALT 14 04/10/2013   AST 46* 04/10/2013   NA 140 04/17/2013   K 3.5 04/17/2013   CL 105 04/17/2013   CREATININE 1.01 04/17/2013   BUN 18 04/17/2013   CO2 22 04/17/2013   INR 1.48 04/15/2013   HGBA1C 6.3* 03/28/2013   Results for ARAYA, ROEL (MRN [id number]) as of 04/17/2013 16:25  Ref. Range 04/16/2013 18:12  Hepatitis B Surface Ag Latest Range: NEGATIVE  NEGATIVE  Hep B S Ab Latest Range: NEGATIVE  NEGATIVE  Hep B Core Total Ab Latest Range: NEGATIVE  NEGATIVE   Dg Chest 1 View  04/15/2013   CLINICAL DATA:  Fevers  EXAM: CHEST - 1 VIEW  COMPARISON:  04/10/2013  FINDINGS: Stable cardiac silhouette. There is increased bandlike densities  in the right lower lobe. Linear thickening at the left lung base additionally. No pneumothorax. Lungs are hyperinflated.  IMPRESSION: Bibasilar linear bandlike opacities are concerning for pneumonia.   Electronically Signed   By: Genevive Bi M.D.   On: 04/15/2013 15:49   Impression and Plan:  1. DLBCL, high grade. Clinical stage IV with bone marrow involvement and B symptoms (fevers and nightsweats).   --Her International Prognostic index is 4 (NEJM, 1993) with a 3-year survival rate of 59% based on an elevated LDH, age greater than 52 years old, performance status of 2, Stage IV. She has extranodal involvement in the spleen and in the bone marrow.   --Yesterday, we had a detailed discussion the patient and her family starting R-miniCHOP (Peyrade F, [employer] Oncology) chemotherapy including the benefits of possibly controlling her disease and the side-effects of therapy including risks of bone marrow toxicity including but not limited to myelosuppression which can lead to the development of life-threatening infections,  nausea/vomiting, diarrhea, ananaphylaxis, neuropathy, cystitis, flu-like symptoms. She understood these risk and agreed to proceed with chemotherapy. We will start R-mini CHOP q 21 days for 6-8 cycles consisting of rituximab, cyclophosphamide, doxorubicin, vincristine, prednisone. In discussion with pharmacy, we choose R-miniCHOP based on her poor nutritional reserve.   The treatment regiment is as follows:      R-miniCHOP  Day #1   375 mg/m(2) rituximab    400 mg/m(2) cyclophosphamide    25 mg/m(2) doxorubicin    1 mg vincristine   Days #1-5 40 mg/m(2) prednisone    (Prednisone WILL BE EXCLUDED as she received prior dose).  She will start neupogen 480 mcg daily on day #2.      PLAN: -- Please place port ASAP and follow tumor lysis panel including basic metabolic panel, uric acid daily.  -- Continue prophylaxis with allopurinol (dose adjusted based on her renal function).  --Start anti-emetics with zofran 8 mg two times daily starting the day after chemo for 3 days then two times a day prn for nausea or vomiting.  -- Start R-miniCHO tommorow (without prednisone as she has already received 4 doses)  -- CNS prophylaxis will be considered later with systemic methotrexate (3-3.5 g/m^2) or intrathecal methotrexate and/or cytarabine for 4- 8 doses based on her bone marrow with large cell lymphoma. An increase of CNS events has been noted.  --Upon discharge she will need the following medicines: Allopurinol 300 mg daily, Zofran 8 mg q 12 hours prn nausea/vomiting, compazine 10 mg q 6 hours prn nausea and vomiting and lorazepam 0.5 mg q 6 hours prn nausea or vomiting.   We will follow this patient closely with you.    Jaclyn Prime. Starr Engel, MD,MS Medical Oncology

## 2013-04-17 NOTE — Progress Notes (Signed)
Spoke to Dr Ruthe Mannan with IR about PAC. Order to hold lovenox obtained. Possible placement on 9/19. Have to call in am after 0800 to IR.

## 2013-04-18 LAB — CBC
MCHC: 30.2 g/dL (ref 30.0–36.0)
MCV: 79.5 fL (ref 78.0–100.0)
Platelets: 110 10*3/uL — ABNORMAL LOW (ref 150–400)
RDW: 18.1 % — ABNORMAL HIGH (ref 11.5–15.5)
WBC: 9.5 10*3/uL (ref 4.0–10.5)

## 2013-04-18 LAB — COMPREHENSIVE METABOLIC PANEL
Albumin: 1.3 g/dL — ABNORMAL LOW (ref 3.5–5.2)
Alkaline Phosphatase: 104 U/L (ref 39–117)
BUN: 22 mg/dL (ref 6–23)
Calcium: 8.3 mg/dL — ABNORMAL LOW (ref 8.4–10.5)
GFR calc Af Amer: 49 mL/min — ABNORMAL LOW (ref >90)
Glucose, Bld: 207 mg/dL — ABNORMAL HIGH (ref 70–99)
Potassium: 3.4 mEq/L — ABNORMAL LOW (ref 3.5–5.1)
Sodium: 140 mEq/L (ref 135–145)
Total Protein: 5.3 g/dL — ABNORMAL LOW (ref 6.0–8.3)

## 2013-04-18 LAB — URIC ACID: Uric Acid, Serum: 1.3 mg/dL — ABNORMAL LOW (ref 2.4–7.0)

## 2013-04-18 LAB — BASIC METABOLIC PANEL
Chloride: 104 mEq/L (ref 96–112)
GFR calc Af Amer: 50 mL/min — ABNORMAL LOW (ref >90)
GFR calc non Af Amer: 43 mL/min — ABNORMAL LOW (ref >90)
Potassium: 3.4 mEq/L — ABNORMAL LOW (ref 3.5–5.1)

## 2013-04-18 LAB — MAGNESIUM: Magnesium: 1.6 mg/dL (ref 1.5–2.5)

## 2013-04-18 MED ORDER — ALBUTEROL SULFATE (5 MG/ML) 0.5% IN NEBU: 2.5000 mg | INHALATION_SOLUTION | Freq: Once | RESPIRATORY_TRACT | Status: AC | PRN

## 2013-04-18 MED ORDER — HEPARIN SOD (PORK) LOCK FLUSH 100 UNIT/ML IV SOLN: 500.0000 [IU] | Freq: Once | INTRAVENOUS | Status: AC | PRN

## 2013-04-18 MED ORDER — VINCRISTINE SULFATE CHEMO INJECTION 1 MG/ML
1.0000 mg | Freq: Once | INTRAVENOUS | Status: AC
  Administered 2013-04-18: 1 mg via INTRAVENOUS
  Filled 2013-04-18: qty 1

## 2013-04-18 MED ORDER — ONDANSETRON HCL 40 MG/20ML IJ SOLN
Freq: Once | INTRAMUSCULAR | Status: AC
  Administered 2013-04-18: 36 mg via INTRAVENOUS
  Filled 2013-04-18: qty 8

## 2013-04-18 MED ORDER — SODIUM CHLORIDE 0.9 % IV SOLN
375.0000 mg/m2 | Freq: Once | INTRAVENOUS | Status: AC
  Administered 2013-04-18: 700 mg via INTRAVENOUS
  Filled 2013-04-18: qty 70

## 2013-04-18 MED ORDER — BENZONATATE 100 MG PO CAPS
100.0000 mg | ORAL_CAPSULE | Freq: Three times a day (TID) | ORAL | Status: DC | PRN
  Administered 2013-04-18: 100 mg via ORAL
  Filled 2013-04-18: qty 1

## 2013-04-18 MED ORDER — FAMOTIDINE IN NACL 20-0.9 MG/50ML-% IV SOLN
20.0000 mg | Freq: Once | INTRAVENOUS | Status: AC | PRN
  Filled 2013-04-18: qty 50

## 2013-04-18 MED ORDER — SODIUM CHLORIDE 0.9 % IV SOLN
Freq: Once | INTRAVENOUS | Status: AC
  Administered 2013-04-18: 10:00:00 via INTRAVENOUS

## 2013-04-18 MED ORDER — SODIUM CHLORIDE 0.9 % IJ SOLN
3.0000 mL | INTRAMUSCULAR | Status: DC | PRN
Start: 2013-04-18 — End: 2013-04-24

## 2013-04-18 MED ORDER — METHYLPREDNISOLONE SODIUM SUCC 125 MG IJ SOLR
125.0000 mg | Freq: Once | INTRAMUSCULAR | Status: AC | PRN
  Filled 2013-04-18: qty 2

## 2013-04-18 MED ORDER — EPINEPHRINE HCL 1 MG/ML IJ SOLN
0.2500 mg | Freq: Once | INTRAMUSCULAR | Status: AC | PRN
  Filled 2013-04-18: qty 1

## 2013-04-18 MED ORDER — DIPHENHYDRAMINE HCL 50 MG PO CAPS
50.0000 mg | ORAL_CAPSULE | Freq: Once | ORAL | Status: AC
  Administered 2013-04-18: 50 mg via ORAL
  Filled 2013-04-18: qty 1

## 2013-04-18 MED ORDER — DIPHENHYDRAMINE HCL 50 MG/ML IJ SOLN: 50.0000 mg | Freq: Once | INTRAMUSCULAR | Status: AC | PRN

## 2013-04-18 MED ORDER — SODIUM CHLORIDE 0.9 % IJ SOLN: 10.0000 mL | INTRAMUSCULAR | Status: DC | PRN

## 2013-04-18 MED ORDER — LEVOFLOXACIN IN D5W 750 MG/150ML IV SOLN
750.0000 mg | INTRAVENOUS | Status: DC
  Administered 2013-04-18 – 2013-04-20 (×2): 750 mg via INTRAVENOUS
  Filled 2013-04-18 (×2): qty 150

## 2013-04-18 MED ORDER — EPINEPHRINE HCL 1 MG/ML IJ SOLN
0.5000 mg | Freq: Once | INTRAMUSCULAR | Status: AC | PRN
  Filled 2013-04-18: qty 1

## 2013-04-18 MED ORDER — HEPARIN SOD (PORK) LOCK FLUSH 100 UNIT/ML IV SOLN: 250.0000 [IU] | Freq: Once | INTRAVENOUS | Status: AC | PRN

## 2013-04-18 MED ORDER — DIPHENHYDRAMINE HCL 50 MG/ML IJ SOLN: 25.0000 mg | Freq: Once | INTRAMUSCULAR | Status: AC | PRN

## 2013-04-18 MED ORDER — ACETAMINOPHEN 325 MG PO TABS
650.0000 mg | ORAL_TABLET | Freq: Once | ORAL | Status: AC
  Administered 2013-04-18: 650 mg via ORAL
  Filled 2013-04-18: qty 2

## 2013-04-18 MED ORDER — SODIUM CHLORIDE 0.9 % IV SOLN: Freq: Once | INTRAVENOUS | Status: AC | PRN

## 2013-04-18 MED ORDER — SODIUM CHLORIDE 0.9 % IV SOLN
400.0000 mg/m2 | Freq: Once | INTRAVENOUS | Status: AC
  Administered 2013-04-18: 700 mg via INTRAVENOUS
  Filled 2013-04-18: qty 35

## 2013-04-18 MED ORDER — DOXORUBICIN HCL CHEMO IV INJECTION 2 MG/ML
25.0000 mg/m2 | Freq: Once | INTRAVENOUS | Status: AC
  Administered 2013-04-18: 44 mg via INTRAVENOUS
  Filled 2013-04-18: qty 22

## 2013-04-18 MED ORDER — ALTEPLASE 2 MG IJ SOLR
2.0000 mg | Freq: Once | INTRAMUSCULAR | Status: AC | PRN
  Filled 2013-04-18: qty 2

## 2013-04-18 NOTE — Progress Notes (Signed)
ANTIBIOTIC CONSULT NOTE - FOLLOW UP  Pharmacy Consult for Vancomycin & Cefepime Indication: fever  Allergies  Allergen Reactions  . Penicillins Rash   Patient Measurements: Height: 5\' 2"  (157.5 cm) Weight: 176 lb 9.4 oz (80.1 kg) IBW/kg (Calculated) : 50.1  Vital Signs: Temp: 98.1 F (36.7 C) (09/20 0514) Temp src: Oral (09/20 0514) BP: 126/81 mmHg (09/20 0514) Pulse Rate: 110 (09/20 0514) Intake/Output from previous day: 09/19 0701 - 09/20 0700 In: 791.3 [P.O.:360; I.V.:178.3] Out: 427 [Urine:425; Stool:2] Intake/Output from this shift:    Labs:  Recent Labs  04/15/13 1056 04/16/13 0353 04/17/13 0450 04/18/13 0510  WBC  --   --  8.7 9.5  HGB 6.3*  --  8.2* 8.1*  PLT  --   --  112* 110*  CREATININE  --  0.95 1.01 1.23*  1.25*   Estimated Creatinine Clearance: 41.1 ml/min (by C-G formula based on Cr of 1.23).  Recent Labs  04/18/13 0510  VANCOTROUGH 13.5    Microbiology: Recent Results (from the past 720 hour(s))  CULTURE, BLOOD (ROUTINE X 2)     Status: None   Collection Time    03/28/13 10:40 PM      Result Value Range Status   Specimen Description BLOOD LEFT ARM   Final   Special Requests BOTTLES DRAWN AEROBIC AND ANAEROBIC 10CC   Final   Culture  Setup Time     Final   Value: 03/29/2013 02:04     Performed at [facility]   Culture     Final   Value: NO GROWTH 5 DAYS     Performed at [facility]   Report Status 04/04/2013 FINAL   Final  CULTURE, BLOOD (ROUTINE X 2)     Status: None   Collection Time    03/28/13 10:50 PM      Result Value Range Status   Specimen Description BLOOD LEFT HAND   Final   Special Requests BOTTLES DRAWN AEROBIC ONLY 10CC   Final   Culture  Setup Time     Final   Value: 03/29/2013 02:04     Performed at [facility]   Culture     Final   Value: NO GROWTH 5 DAYS     Performed at [facility]   Report Status 04/04/2013 FINAL   Final  CULTURE, BLOOD (ROUTINE X 2)     Status: None   Collection Time    04/10/13  8:05 PM      Result Value Range Status   Specimen Description BLOOD RIGHT ARM   Final   Special Requests BOTTLES DRAWN AEROBIC AND ANAEROBIC 10CC   Final   Culture  Setup Time     Final   Value: 04/11/2013 02:12     Performed at [facility]   Culture     Final   Value: NO GROWTH 5 DAYS     Performed at [facility]   Report Status 04/17/2013 FINAL   Final  CULTURE, BLOOD (ROUTINE X 2)     Status: None   Collection Time    04/10/13  8:10 PM      Result Value Range Status   Specimen Description BLOOD RIGHT HAND   Final   Special Requests BOTTLES DRAWN AEROBIC AND ANAEROBIC 10CC   Final   Culture  Setup Time     Final   Value: 04/11/2013 02:12     Performed at [facility]  Final   Value: NO GROWTH 5 DAYS     Performed at [facility]   Report Status 04/17/2013 FINAL   Final  CULTURE, BLOOD (ROUTINE X 2)     Status: None   Collection Time    04/15/13  3:43 PM      Result Value Range Status   Specimen Description BLOOD LEFT HAND   Final   Special Requests BOTTLES DRAWN AEROBIC ONLY 3CC   Final   Culture  Setup Time     Final   Value: 04/16/2013 03:42     Performed at [facility]   Culture     Final   Value:        BLOOD CULTURE RECEIVED NO GROWTH TO DATE CULTURE WILL BE HELD FOR 5 DAYS BEFORE ISSUING A FINAL NEGATIVE REPORT     Performed at [facility]   Report Status PENDING   Incomplete  CULTURE, BLOOD (ROUTINE X 2)     Status: None   Collection Time    04/15/13  3:47 PM      Result Value Range Status   Specimen Description BLOOD RIGHT HAND   Final   Special Requests BOTTLES DRAWN AEROBIC ONLY 3CC   Final   Culture  Setup Time     Final   Value: 04/16/2013 03:42     Performed at [facility]   Culture     Final   Value:        BLOOD CULTURE RECEIVED NO GROWTH TO DATE CULTURE WILL BE HELD FOR 5 DAYS BEFORE ISSUING A FINAL NEGATIVE REPORT     Performed at [facility]   Report Status PENDING   Incomplete    Anti-infectives   Start     Dose/Rate Route Frequency Ordered Stop   04/17/13 1600  fluconazole (DIFLUCAN) IVPB 200 mg     200 mg 100 mL/hr over 60 Minutes Intravenous Every 24 hours 04/17/13 1357 04/24/13 1559   04/16/13 0600  vancomycin (VANCOCIN) 500 mg in sodium chloride 0.9 % 100 mL IVPB     500 mg 100 mL/hr over 60 Minutes Intravenous Every 12 hours 04/15/13 1604     04/15/13 1700  vancomycin (VANCOCIN) IVPB 750 mg/150 ml premix     750 mg 150 mL/hr over 60 Minutes Intravenous  Once 04/15/13 1604 04/15/13 1806   04/15/13 1630  ceFEPIme (MAXIPIME) 2 g in dextrose 5 % 50 mL IVPB     2 g 100 mL/hr over 30 Minutes Intravenous Every 24 hours 04/15/13 1604       Assessment: 27 yoF with newly diagnosed NHL, admit 9/12 for hypercalcemia, acute renal failure, fever. Added antibiotics 9/17; Vancomycin and Cefepime with pharmacy to dose.   Renal function improved with hydration, wnl 9/16-9/18. SCr has increased today to 1.25, Cl ~ 40 ml/min  Vancomycin trough today 13.5 mcg/ml on 500mg  q12hr, goal 15-20. With SCr increased, will not change dose. Anticipate accumulation of Vancomycin.   Plan Chemotherapy today with mini R-CHOP. Dose reduction due to patient status.   Fluconazole 200mg  IV daily x 7 doses from 9/19  Will follow renal function very closely.  Goal of Therapy:  Vancomycin trough level 15-20 mcg/ml  Plan:   Continue Vancomycin 500mg  q12  Cefepime 2gm q24hr  Fluconazole 200mg  IV q24 is appropriate for renal function  Follow patient closely for renal function, etc.  Otho Bellows PharmD Pager [phone] 04/18/2013, 7:45 AM

## 2013-04-18 NOTE — Progress Notes (Addendum)
[facility] PROGRESS NOTE  Tonya Wise EAV:[id number] DOB: 09-16-1940 DOA: 04/10/2013 PCP: [name], MD  Brief narrative: 72 year old female with past medical history of recently diagnosed non-Hodgkin's lymphoma who was admitted to [facility] ED 04/10/2013 with dehydration, acute renal failure, hypercalcemia. Hospital course has been complicated due to lethargy and increasing confusion. Patient mental status has improved and plan is for chemo today considering port-a-cath has been placed.  Assessment/Plan:   Principal Problem:  Non-Hodgkins lymphoma with fever/rigors, newly diagnosed  - Patient initially started on prednisone 04/11/2013. Due to the increasing lethargy and confusion prednisone was put on hold. As part of chemo it will be restarted. - Patient had bone marrow biopsy of left iliac crest done 04/15/2013 - cytology positive for lymphoma - Chemotherapy today; continue allopurinol (high risk of tumor lysis syndrome)  - port-a-cath placed  Active Problems:  Hypercalcemia of malignancy  - Patient was given Pamidronate and Lasix 04/12/2013 as well as calcitonin with calcium now being within normal limits  - Calcitonin is now discontinued Hospital acquired pneumonia  - based on CXR which showed bibasilar pneumonia  - switch to levaquin today as blood cultures negative Anemia of chronic disease  - Hemoglobin 6.3 on 04/15/2013 and pt received 1 unit PRBC  - hemoglobin 8.1 Lethargy/metabolic encephalopathy  - Likely a combination of hypercalcemia and steroid effect. Steroids stopped but will be used as part of chemotx.  - mental status improving Thrush  - Continue fluconazole, pt still has thrush  Hypokalemia / hypophosphatemia  - Repleted with Mag ox and K-Phos by mouth  - continue to replace electrolytes as needed  Anorexia / severe protein calorie malnutrition  - continue nutritional supplements  - seen by nutritionist  Grade I diastolic dysfunction  - Stable   Acute renal failure  - Secondary to dehydration.  - Kidney function is now within normal limits. Resolved with IV fluids  Elevated LFTs / hepatic steatosis  - Hepatic steatosis noted on CT abdomen/pelvis done 03/30/13.    Code Status: DNR/DNI  Family Communication: [name] (husband) [phone] (cell), [phone] (home). Husband updated at bedside.  Disposition Plan: Home when stable.   Medical Consultants:  Dr. Myra Rude, Oncology Other Consultants:  Dietitian Anti-infectives:  Cefepime and vanco started 04/15/2013 and d/c'ed today 9/20 Levaquin 9/20 2014 -->  Manson Passey, MD  [facility]  Pager [phone]   If 7PM-7AM, please contact night-coverage [email or web address] Password [email or web address] 04/18/2013, 1:39 PM   LOS: 8 days    HPI/Subjective: No overnight events.  Objective: Filed Vitals:   04/17/13 2258 04/18/13 0210 04/18/13 0514 04/18/13 0824  BP:   126/81   Pulse:   110   Temp: 100.5 F (38.1 C) 98.4 F (36.9 C) 98.1 F (36.7 C)   TempSrc: Oral Oral Oral   Resp: 22  22   Height:      Weight:      SpO2:   93% 93%    Intake/Output Summary (Last 24 hours) at 04/18/13 1339 Last data filed at 04/18/13 0900  Gross per 24 hour  Intake 1353.67 ml  Output    726 ml  Net 627.67 ml    Exam:   General:  Pt is sleeping, not in acute distress  Cardiovascular: Regular rate and rhythm, S1/S2, no murmurs, no rubs, no gallops  Respiratory: Clear to auscultation bilaterally, no wheezing, no crackles, no rhonchi  Abdomen: Soft, non tender, non distended, bowel sounds present, no guarding  Extremities: LE and UE edema, pulses DP and  PT palpable bilaterally  Neuro: Grossly nonfocal  Data Reviewed: Basic Metabolic Panel:  Recent Labs Lab 04/14/13 0403 04/14/13 1735 04/15/13 0349 04/16/13 0353 04/17/13 0450 04/18/13 0510  NA 138 136 138 139 140 139  140  K 3.4* 3.6 3.3* 3.5 3.5 3.4*  3.4*  CL 103 103 104 104 105 104  104  CO2 25 24 24 23 22 24  24   GLUCOSE  235* 287* 246* 222* 218* 203*  207*  BUN 17 16 15 15 18 22  22   CREATININE 1.17* 1.00 0.98 0.95 1.01 1.23*  1.25*  CALCIUM 10.8* 10.1 9.7 9.0 8.5 8.1*  8.3*  MG 1.8  --  1.8 1.5 1.6 1.6  PHOS 1.6*  --  1.7* 1.3* 2.4 2.5   Liver Function Tests:  Recent Labs Lab 04/18/13 0510  AST 40*  ALT 8  ALKPHOS 104  BILITOT 1.2  PROT 5.3*  ALBUMIN 1.3*   No results found for this basename: LIPASE, AMYLASE,  in the last 168 hours No results found for this basename: AMMONIA,  in the last 168 hours CBC:  Recent Labs Lab 04/12/13 0403 04/15/13 0349 04/15/13 1056 04/17/13 0450 04/18/13 0510  WBC 6.6 6.1  --  8.7 9.5  HGB 8.1* 6.9* 6.3* 8.2* 8.1*  HCT 25.9* 23.0* 21.5* 27.2* 26.8*  MCV 77.5* 78.2  --  79.1 79.5  PLT 175 133*  --  112* 110*   Cardiac Enzymes: No results found for this basename: CKTOTAL, CKMB, CKMBINDEX, TROPONINI,  in the last 168 hours BNP: No components found with this basename: POCBNP,  CBG: No results found for this basename: GLUCAP,  in the last 168 hours  Recent Results (from the past 240 hour(s))  CULTURE, BLOOD (ROUTINE X 2)     Status: None   Collection Time    04/10/13  8:05 PM      Result Value Range Status   Specimen Description BLOOD RIGHT ARM   Final   Special Requests BOTTLES DRAWN AEROBIC AND ANAEROBIC 10CC   Final   Culture  Setup Time     Final   Value: 04/11/2013 02:12     Performed at [facility]   Culture     Final   Value: NO GROWTH 5 DAYS     Performed at [facility]   Report Status 04/17/2013 FINAL   Final  CULTURE, BLOOD (ROUTINE X 2)     Status: None   Collection Time    04/10/13  8:10 PM      Result Value Range Status   Specimen Description BLOOD RIGHT HAND   Final   Special Requests BOTTLES DRAWN AEROBIC AND ANAEROBIC 10CC   Final   Culture  Setup Time     Final   Value: 04/11/2013 02:12     Performed at [facility]   Culture     Final   Value: NO GROWTH 5 DAYS     Performed at [facility]   Report Status 04/17/2013 FINAL   Final  CULTURE, BLOOD (ROUTINE X 2)     Status: None   Collection Time    04/15/13  3:43 PM      Result Value Range Status   Specimen Description BLOOD LEFT HAND   Final   Special Requests BOTTLES DRAWN AEROBIC ONLY 3CC   Final   Culture  Setup Time     Final   Value: 04/16/2013 03:42     Performed at [facility]   Culture     Final   Value:        BLOOD CULTURE RECEIVED NO GROWTH TO DATE CULTURE WILL BE HELD FOR 5 DAYS BEFORE ISSUING A FINAL NEGATIVE REPORT     Performed at [facility]   Report Status PENDING   Incomplete  CULTURE, BLOOD (ROUTINE X 2)     Status: None   Collection Time    04/15/13  3:47 PM      Result Value Range Status   Specimen Description BLOOD RIGHT HAND   Final   Special Requests BOTTLES DRAWN AEROBIC ONLY 3CC   Final   Culture  Setup Time     Final   Value: 04/16/2013 03:42     Performed at [facility]   Culture     Final   Value:        BLOOD CULTURE RECEIVED NO GROWTH TO DATE CULTURE WILL BE HELD FOR 5 DAYS BEFORE ISSUING A FINAL NEGATIVE REPORT     Performed at [facility]   Report Status PENDING   Incomplete     Studies: Ir Fluoro Guide Cv Line Right 04/18/2013     IMPRESSION: Successful 8 French right internal jugular vein power port placement with its tip at the SVC/RA junction.   Electronically Signed   By: Maryclare Bean M.D.   On: 04/18/2013 09:39   Ir US Guide Vasc Access Right 04/18/2013  IMPRESSION: Successful 8 French right internal jugular vein power port placement with its tip at the SVC/RA junction.   Electronically Signed   By: Maryclare Bean M.D.   On: 04/18/2013 09:39    Scheduled Meds: . allopurinol  300 mg Oral BID  . antiseptic oral rinse  15 mL Mouth Rinse BID  . aspirin EC  81 mg Oral Daily  . bisoprolol  10 mg Oral Daily  . ceFEPime (MAXIPIME) IV  2 g Intravenous Q24H  . enoxaparin (LOVENOX) injection  40 mg Subcutaneous Q24H  . fluconazole (DIFLUCAN) IV   200 mg Intravenous Q24H  . levalbuterol  0.63 mg Nebulization TID  . multivitamin with minerals  1 tablet Oral Daily  . nystatin  5 mL Oral QID  . pantoprazole  40 mg Oral Daily  . riTUXimab (RITUXAN) IV infusion  375 mg/m2 (Treatment Plan Actual) Intravenous Once  . vancomycin  500 mg Intravenous Q12H   Continuous Infusions: . sodium chloride 20 mL/hr at 04/17/13 2026

## 2013-04-18 NOTE — Progress Notes (Signed)
PT Cancellation Note  Patient Details Name: Tonya Wise MRN: [id number] DOB: Dec 27, 1940   Cancelled Treatment:    Reason Eval/Treat Not Completed: Medical issues which prohibited therapy (receiving chemotherapy;"dr told her to rest")   Donnetta Hail 04/18/2013, 1:07 PM

## 2013-04-18 NOTE — Progress Notes (Signed)
Pt tolerated chemo without complications. Will continue to monitor

## 2013-04-19 DIAGNOSIS — D649 Anemia, unspecified: Secondary | ICD-10-CM

## 2013-04-19 DIAGNOSIS — R609 Edema, unspecified: Secondary | ICD-10-CM

## 2013-04-19 DIAGNOSIS — D696 Thrombocytopenia, unspecified: Secondary | ICD-10-CM

## 2013-04-19 DIAGNOSIS — D509 Iron deficiency anemia, unspecified: Secondary | ICD-10-CM

## 2013-04-19 DIAGNOSIS — R64 Cachexia: Secondary | ICD-10-CM

## 2013-04-19 LAB — GLUCOSE, CAPILLARY
Glucose-Capillary: 272 mg/dL — ABNORMAL HIGH (ref 70–99)
Glucose-Capillary: 333 mg/dL — ABNORMAL HIGH (ref 70–99)

## 2013-04-19 LAB — URIC ACID: Uric Acid, Serum: 1.4 mg/dL — ABNORMAL LOW (ref 2.4–7.0)

## 2013-04-19 LAB — PREPARE RBC (CROSSMATCH)

## 2013-04-19 LAB — CBC
Hemoglobin: 7.2 g/dL — ABNORMAL LOW (ref 12.0–15.0)
MCH: 24 pg — ABNORMAL LOW (ref 26.0–34.0)
MCHC: 30.5 g/dL (ref 30.0–36.0)
Platelets: 86 10*3/uL — ABNORMAL LOW (ref 150–400)

## 2013-04-19 LAB — PHOSPHORUS: Phosphorus: 2.4 mg/dL (ref 2.3–4.6)

## 2013-04-19 LAB — COMPREHENSIVE METABOLIC PANEL
ALT: 8 U/L (ref 0–35)
AST: 42 U/L — ABNORMAL HIGH (ref 0–37)
BUN: 30 mg/dL — ABNORMAL HIGH (ref 6–23)
Calcium: 7.6 mg/dL — ABNORMAL LOW (ref 8.4–10.5)
GFR calc Af Amer: 43 mL/min — ABNORMAL LOW (ref >90)
Glucose, Bld: 282 mg/dL — ABNORMAL HIGH (ref 70–99)
Sodium: 133 mEq/L — ABNORMAL LOW (ref 135–145)
Total Protein: 4.7 g/dL — ABNORMAL LOW (ref 6.0–8.3)

## 2013-04-19 MED ORDER — FUROSEMIDE 10 MG/ML IJ SOLN
40.0000 mg | Freq: Once | INTRAMUSCULAR | Status: AC
  Administered 2013-04-19: 40 mg via INTRAVENOUS
  Filled 2013-04-19 (×2): qty 4

## 2013-04-19 MED ORDER — INSULIN ASPART 100 UNIT/ML ~~LOC~~ SOLN
0.0000 [IU] | Freq: Three times a day (TID) | SUBCUTANEOUS | Status: DC
  Administered 2013-04-19 (×2): 11 [IU] via SUBCUTANEOUS
  Administered 2013-04-20: 15 [IU] via SUBCUTANEOUS
  Administered 2013-04-20: 9 [IU] via SUBCUTANEOUS
  Administered 2013-04-20: 11 [IU] via SUBCUTANEOUS
  Administered 2013-04-21: 15 [IU] via SUBCUTANEOUS
  Administered 2013-04-21 (×2): 8 [IU] via SUBCUTANEOUS
  Administered 2013-04-22: 11 [IU] via SUBCUTANEOUS
  Administered 2013-04-22: 8 [IU] via SUBCUTANEOUS
  Administered 2013-04-22: 11 [IU] via SUBCUTANEOUS
  Administered 2013-04-23: 5 [IU] via SUBCUTANEOUS
  Administered 2013-04-23 (×2): 8 [IU] via SUBCUTANEOUS
  Administered 2013-04-24: 5 [IU] via SUBCUTANEOUS
  Administered 2013-04-24: 8 [IU] via SUBCUTANEOUS

## 2013-04-19 MED ORDER — FUROSEMIDE 10 MG/ML IJ SOLN
20.0000 mg | Freq: Once | INTRAMUSCULAR | Status: AC
  Administered 2013-04-19: 20 mg via INTRAVENOUS
  Filled 2013-04-19: qty 2

## 2013-04-19 MED ORDER — INSULIN ASPART 100 UNIT/ML ~~LOC~~ SOLN: 5.0000 [IU] | Freq: Three times a day (TID) | SUBCUTANEOUS | Status: DC

## 2013-04-19 MED ORDER — POTASSIUM CHLORIDE CRYS ER 20 MEQ PO TBCR
40.0000 meq | EXTENDED_RELEASE_TABLET | Freq: Once | ORAL | Status: AC
  Administered 2013-04-19: 40 meq via ORAL
  Filled 2013-04-19: qty 2

## 2013-04-19 MED ORDER — SODIUM CHLORIDE 0.9 % IJ SOLN: 3.0000 mL | INTRAMUSCULAR | Status: DC | PRN

## 2013-04-19 MED ORDER — HEPARIN SOD (PORK) LOCK FLUSH 100 UNIT/ML IV SOLN: 500.0000 [IU] | Freq: Every day | INTRAVENOUS | Status: DC | PRN

## 2013-04-19 MED ORDER — HEPARIN SOD (PORK) LOCK FLUSH 100 UNIT/ML IV SOLN: 250.0000 [IU] | INTRAVENOUS | Status: DC | PRN

## 2013-04-19 MED ORDER — SODIUM CHLORIDE 0.9 % IJ SOLN: 10.0000 mL | INTRAMUSCULAR | Status: DC | PRN

## 2013-04-19 MED ORDER — SODIUM CHLORIDE 0.9 % IV SOLN: 250.0000 mL | Freq: Once | INTRAVENOUS | Status: DC

## 2013-04-19 MED ORDER — HYDROCODONE-HOMATROPINE 5-1.5 MG/5ML PO SYRP
5.0000 mL | ORAL_SOLUTION | Freq: Four times a day (QID) | ORAL | Status: DC | PRN
  Administered 2013-04-19: 5 mL via ORAL
  Administered 2013-04-19: 2 mL via ORAL
  Administered 2013-04-21 – 2013-04-22 (×2): 5 mL via ORAL
  Filled 2013-04-19 (×4): qty 5

## 2013-04-19 MED ORDER — FILGRASTIM 480 MCG/1.6ML IJ SOLN
480.0000 ug | Freq: Once | INTRAMUSCULAR | Status: AC
  Administered 2013-04-19: 480 ug via SUBCUTANEOUS
  Filled 2013-04-19: qty 1.6

## 2013-04-19 NOTE — Progress Notes (Addendum)
[facility] PROGRESS NOTE  Tonya Wise:[id number] DOB: 1941-06-28 DOA: 04/10/2013 PCP: [name], MD  Brief narrative: 72 year old female with past medical history of recently diagnosed non-Hodgkin's lymphoma who was admitted to [facility] ED 04/10/2013 with dehydration, acute renal failure, hypercalcemia. Hospital course has been complicated due to lethargy and increasing confusion. Patient mental status has improved. Pt had 1st chemotherapy 04/18/2013 and tolerated it well. Port-a-cath placed 04/17/2013.    Assessment/Plan:   Principal Problem:  Non-Hodgkins lymphoma with fever/rigors, newly diagnosed  - Patient initially started on prednisone 04/11/2013. Due to the increasing lethargy and confusion prednisone was put on hold. As part of chemo it was restarted. Now 24 hours after, patient's mental status still good, alert and oriented to time, place and person - Patient had bone marrow biopsy of left iliac crest done 04/15/2013 - cytology positive for lymphoma  - Continue allopurinol (high risk of tumor lysis syndrome)  - port-a-cath placed with no complications. Active Problems:  Hypercalcemia of malignancy  - Patient was given Pamidronate and Lasix 04/12/2013 as well as calcitonin with calcium now being within normal limits  - Calcitonin is now discontinued  Hospital acquired pneumonia  - CXR showed bibasilar pneumonia  - blood cultures to date are negative - continue Levaquin  Anemia of chronic disease  - Hemoglobin 6.3 on 04/15/2013 and pt received 1 unit PRBC  - hemoglobin 8.1 and now after chemo 7.2 so will give 2 units PRBC Thrombocytopenia - platelets 112 and now after chemo 86; this is to be expected with chemo so will observe for signs of bleed and continue to monitor CBC Lethargy/metabolic encephalopathy  - Likely a combination of hypercalcemia and steroids.  - mental status at baseline Thrush  - Continue fluconazole 200 mg IV daily Hypokalemia /  hypophosphatemia  - Repleted with Mag ox and K-Phos by mouth  - continue to replace electrolytes as needed  Anorexia / severe protein calorie malnutrition  - continue nutritional supplements  - encourage PO intake Grade I diastolic dysfunction  - Stable  - Given 1 dose of lasix 40 mg IV due to worsening LE edema Acute renal failure  - Secondary to dehydration.  - slightly worse creatinine today - continue to monitor renal function Elevated LFTs / hepatic steatosis  - Hepatic steatosis noted on CT abdomen/pelvis done 03/30/13.    Code Status: DNR/DNI  Family Communication: [name] (husband) [phone] (cell), [phone] (home). Husband updated at bedside.  Disposition Plan: Home when stable.    Medical Consultants:  Dr. Myra Rude, Oncology Other Consultants:  Dietitian Anti-infectives:  Cefepime and vanco started 04/15/2013 and d/c'ed today 9/20  Levaquin 9/20 2014 -->   Manson Passey, MD  [facility]  Pager [phone]  If 7PM-7AM, please contact night-coverage [email or web address] Password [email or web address] 04/19/2013, 2:33 PM   LOS: 9 days   HPI/Subjective: Looks better this am, participates in conversation, alert and oriented.   Objective: Filed Vitals:   04/18/13 1937 04/18/13 2112 04/19/13 0509 04/19/13 0752  BP:  118/80 118/71   Pulse:  97 102   Temp:  97.4 F (36.3 C) 98.1 F (36.7 C)   TempSrc:  Oral Oral   Resp:  20 20   Height:      Weight:      SpO2: 96% 98% 95% 94%    Intake/Output Summary (Last 24 hours) at 04/19/13 1433 Last data filed at 04/19/13 1325  Gross per 24 hour  Intake   1888 ml  Output  500 ml  Net   1388 ml    Exam:   General:  Pt is alert, follows commands appropriately, not in acute distress  Cardiovascular: Regular rate and rhythm, S1/S2, no murmurs, no rubs, no gallops  Respiratory: Clear to auscultation bilaterally, no wheezing, no crackles, no rhonchi  Abdomen: Soft, non tender, non distended, bowel sounds present, no  guarding  Extremities: LE +1 pitting edema, upper extremity edema, pulses DP and PT palpable bilaterally  Neuro: Grossly nonfocal  Data Reviewed: Basic Metabolic Panel:  Recent Labs Lab 04/15/13 0349 04/16/13 0353 04/17/13 0450 04/18/13 0510 04/19/13 0448  NA 138 139 140 139  140 133*  K 3.3* 3.5 3.5 3.4*  3.4* 3.4*  CL 104 104 105 104  104 100  CO2 24 23 22 24  24 21   GLUCOSE 246* 222* 218* 203*  207* 282*  BUN 15 15 18 22  22  30*  CREATININE 0.98 0.95 1.01 1.23*  1.25* 1.39*  CALCIUM 9.7 9.0 8.5 8.1*  8.3* 7.6*  MG 1.8 1.5 1.6 1.6 1.6  PHOS 1.7* 1.3* 2.4 2.5 2.4   Liver Function Tests:  Recent Labs Lab 04/18/13 0510 04/19/13 0448  AST 40* 42*  ALT 8 8  ALKPHOS 104 102  BILITOT 1.2 1.0  PROT 5.3* 4.7*  ALBUMIN 1.3* 1.2*   No results found for this basename: LIPASE, AMYLASE,  in the last 168 hours No results found for this basename: AMMONIA,  in the last 168 hours CBC:  Recent Labs Lab 04/15/13 0349 04/15/13 1056 04/17/13 0450 04/18/13 0510 04/19/13 0448  WBC 6.1  --  8.7 9.5 7.7  HGB 6.9* 6.3* 8.2* 8.1* 7.2*  HCT 23.0* 21.5* 27.2* 26.8* 23.6*  MCV 78.2  --  79.1 79.5 78.7  PLT 133*  --  112* 110* 86*   Cardiac Enzymes: No results found for this basename: CKTOTAL, CKMB, CKMBINDEX, TROPONINI,  in the last 168 hours BNP: No components found with this basename: POCBNP,  CBG:  Recent Labs Lab 04/19/13 0850 04/19/13 1156  GLUCAP 272* 315*    Recent Results (from the past 240 hour(s))  CULTURE, BLOOD (ROUTINE X 2)     Status: None   Collection Time    04/10/13  8:05 PM      Result Value Range Status   Specimen Description BLOOD RIGHT ARM   Final   Special Requests BOTTLES DRAWN AEROBIC AND ANAEROBIC 10CC   Final   Culture  Setup Time     Final   Value: 04/11/2013 02:12     Performed at [facility]   Culture     Final   Value: NO GROWTH 5 DAYS     Performed at [facility]   Report Status 04/17/2013 FINAL   Final   CULTURE, BLOOD (ROUTINE X 2)     Status: None   Collection Time    04/10/13  8:10 PM      Result Value Range Status   Specimen Description BLOOD RIGHT HAND   Final   Special Requests BOTTLES DRAWN AEROBIC AND ANAEROBIC 10CC   Final   Culture  Setup Time     Final   Value: 04/11/2013 02:12     Performed at [facility]   Culture     Final   Value: NO GROWTH 5 DAYS     Performed at [facility]   Report Status 04/17/2013 FINAL   Final  CULTURE, BLOOD (ROUTINE X 2)  Status: None   Collection Time    04/15/13  3:43 PM      Result Value Range Status   Specimen Description BLOOD LEFT HAND   Final   Special Requests BOTTLES DRAWN AEROBIC ONLY 3CC   Final   Culture  Setup Time     Final   Value: 04/16/2013 03:42     Performed at [facility]   Culture     Final   Value:        BLOOD CULTURE RECEIVED NO GROWTH TO DATE CULTURE WILL BE HELD FOR 5 DAYS BEFORE ISSUING A FINAL NEGATIVE REPORT     Performed at [facility]   Report Status PENDING   Incomplete  CULTURE, BLOOD (ROUTINE X 2)     Status: None   Collection Time    04/15/13  3:47 PM      Result Value Range Status   Specimen Description BLOOD RIGHT HAND   Final   Special Requests BOTTLES DRAWN AEROBIC ONLY 3CC   Final   Culture  Setup Time     Final   Value: 04/16/2013 03:42     Performed at [facility]   Culture     Final   Value:        BLOOD CULTURE RECEIVED NO GROWTH TO DATE CULTURE WILL BE HELD FOR 5 DAYS BEFORE ISSUING A FINAL NEGATIVE REPORT     Performed at [facility]   Report Status PENDING   Incomplete     Studies: Ir Fluoro Guide Cv Line Right  04/18/2013   CLINICAL DATA:  Cancer  EXAM: TUNNEL POWER PORT PLACEMENT WITH SUBCUTANEOUS POCKET UTILIZING ULTRASOUND & FLOUROSCOPY  MEDICATIONS AND MEDICAL HISTORY: Versed 2.0 mg, Fentanyl 100 mcg.  Vancomycin was given within two hours of incision. Vancomycin was given due to an antibiotic allergy.  ANESTHESIA/SEDATION:  Moderate sedation time: 25 minutes  CONTRAST:  None  FLUOROSCOPY TIME:  1 min and 6 seconds.  PROCEDURE: After written informed consent was obtained, patient was placed in the supine position on angiographic table. The right neck and chest was prepped and draped in a sterile fashion. Lidocaine was utilized for local anesthesia. The right internal jugular vein was noted to be patent initially with ultrasound. Under sonographic guidance, a micropuncture needle was inserted into the right IJ vein (Ultrasound and fluoroscopic image documentation was performed). The needle was removed over an 018 wire which was exchanged for a Amplatz. This was advanced into the IVC. An 8-French dilator was advanced over the Amplatz.  A small incision was made in the right upper chest over the anterior right second rib. Utilizing blunt dissection, a subcutaneous pocket was created in the caudal direction. The pocket was irrigated with a copious amount of sterile normal saline. The port catheter was tunneled from the chest incision, and out the neck incision. The reservoir was inserted into the subcutaneous pocket and secured with two 3-0 Ethilon stitches. A peel-away sheath was advanced over the Amplatz wire. The port catheter was cut to measure length and inserted through the peel-away sheath. The peel-away sheath was removed. The chest incision was closed with 3-0 Vicryl interrupted stitches for the subcutaneous tissue and a running of 4-0 Vicryl subcuticular stitch for the skin. The neck incision was closed with a 4-0 Vicryl subcuticular stitch. Derma-bond was applied to both surgical incisions. The port reservoir was flushed and instilled with heparinized saline. No complications.  FINDINGS: A right IJ vein Port-A-Cath is in place  with its tip at the cavoatrial junction.  IMPRESSION: Successful 8 French right internal jugular vein power port placement with its tip at the SVC/RA junction.   Electronically Signed   By: Maryclare Bean M.D.    On: 04/18/2013 09:39   Ir US Guide Vasc Access Right  04/18/2013   CLINICAL DATA:  Cancer  EXAM: TUNNEL POWER PORT PLACEMENT WITH SUBCUTANEOUS POCKET UTILIZING ULTRASOUND & FLOUROSCOPY  MEDICATIONS AND MEDICAL HISTORY: Versed 2.0 mg, Fentanyl 100 mcg.  Vancomycin was given within two hours of incision. Vancomycin was given due to an antibiotic allergy.  ANESTHESIA/SEDATION: Moderate sedation time: 25 minutes  CONTRAST:  None  FLUOROSCOPY TIME:  1 min and 6 seconds.  PROCEDURE: After written informed consent was obtained, patient was placed in the supine position on angiographic table. The right neck and chest was prepped and draped in a sterile fashion. Lidocaine was utilized for local anesthesia. The right internal jugular vein was noted to be patent initially with ultrasound. Under sonographic guidance, a micropuncture needle was inserted into the right IJ vein (Ultrasound and fluoroscopic image documentation was performed). The needle was removed over an 018 wire which was exchanged for a Amplatz. This was advanced into the IVC. An 8-French dilator was advanced over the Amplatz.  A small incision was made in the right upper chest over the anterior right second rib. Utilizing blunt dissection, a subcutaneous pocket was created in the caudal direction. The pocket was irrigated with a copious amount of sterile normal saline. The port catheter was tunneled from the chest incision, and out the neck incision. The reservoir was inserted into the subcutaneous pocket and secured with two 3-0 Ethilon stitches. A peel-away sheath was advanced over the Amplatz wire. The port catheter was cut to measure length and inserted through the peel-away sheath. The peel-away sheath was removed. The chest incision was closed with 3-0 Vicryl interrupted stitches for the subcutaneous tissue and a running of 4-0 Vicryl subcuticular stitch for the skin. The neck incision was closed with a 4-0 Vicryl subcuticular stitch. Derma-bond was  applied to both surgical incisions. The port reservoir was flushed and instilled with heparinized saline. No complications.  FINDINGS: A right IJ vein Port-A-Cath is in place with its tip at the cavoatrial junction.  IMPRESSION: Successful 8 French right internal jugular vein power port placement with its tip at the SVC/RA junction.   Electronically Signed   By: Maryclare Bean M.D.   On: 04/18/2013 09:39    Scheduled Meds: . sodium chloride  250 mL Intravenous Once  . allopurinol  300 mg Oral BID  . antiseptic oral rinse  15 mL Mouth Rinse BID  . aspirin EC  81 mg Oral Daily  . bisoprolol  10 mg Oral Daily  . enoxaparin (LOVENOX) injection  40 mg Subcutaneous Q24H  . filgrastim (NEUPOGEN)  SQ  480 mcg Subcutaneous ONCE-1800  . fluconazole (DIFLUCAN) IV  200 mg Intravenous Q24H  . furosemide  20 mg Intravenous Once  . insulin aspart  0-15 Units Subcutaneous TID WC  . levalbuterol  0.63 mg Nebulization TID  . levofloxacin (LEVAQUIN) IV  750 mg Intravenous Q48H  . multivitamin with minerals  1 tablet Oral Daily  . nystatin  5 mL Oral QID  . pantoprazole  40 mg Oral Daily   Continuous Infusions: . sodium chloride 20 mL/hr at 04/19/13 1012

## 2013-04-19 NOTE — Progress Notes (Signed)
Paged Elray Mcgregor NP via [email or web address] and texted here with lab results. Glucose 282. Creatinine 1.39 BUN 30. Patient with gen edema. IVF at [facility] only due to edema. Urine output tonight only 200 ml. Awaiting orders or call back.

## 2013-04-19 NOTE — Progress Notes (Signed)
Patient with dry hacking cough at times through the night. Given robitussin dm without relief. Got order for tessalon pearles with little decreased. Got order for hycodan prn. Dose given with relief for few hours.

## 2013-04-19 NOTE — Progress Notes (Signed)
DIAGNOSIS:  #1 non-Hodgkin lymphoma, staging pending, with B symptoms INTERVAL HISTORY: Tonya Wise 72 y.o. female is seen today in the hospital. The confusion has resolved. On 04/18/2013, she received cycle 1 of CHOP chemotherapy. Overall she tolerated treatment very well with no side effects. She denies any recent fevers or chills. She denies any bleeding such as sepsis, hematuria, or hematochezia. She has very poor appetite he would she denies any lightheadedness dizziness but has profound fatigue. She denies any pain.  I have reviewed the past medical history, past surgical history, social history and family history with the patient and they are unchanged from previous note.  ALLERGIES:  is allergic to penicillins.  MEDICATIONS: Current facility-administered medications:0.9 %  sodium chloride infusion, , Intravenous, Continuous, Alison Murray, MD, Last Rate: 20 mL/hr at 04/19/13 1012;  0.9 %  sodium chloride infusion, 250 mL, Intravenous, Once, Artis Delay, MD;  acetaminophen (TYLENOL) suppository 650 mg, 650 mg, Rectal, Q6H PRN, Maryruth Bun Rama, MD, 650 mg at 04/15/13 2006 acetaminophen (TYLENOL) tablet 650 mg, 650 mg, Oral, Q6H PRN, Maryruth Bun Rama, MD, 650 mg at 04/17/13 2309;  allopurinol (ZYLOPRIM) tablet 300 mg, 300 mg, Oral, BID, Maryruth Bun Rama, MD, 300 mg at 04/19/13 1012;  alum & mag hydroxide-simeth (MAALOX/MYLANTA) 200-200-20 MG/5ML suspension 30 mL, 30 mL, Oral, Q6H PRN, Maryruth Bun Rama, MD antiseptic oral rinse (BIOTENE) solution 15 mL, 15 mL, Mouth Rinse, BID, Maryruth Bun Rama, MD, 15 mL at 04/19/13 0655;  aspirin EC tablet 81 mg, 81 mg, Oral, Daily, Maryruth Bun Rama, MD, 81 mg at 04/19/13 1012;  bisoprolol (ZEBETA) tablet 10 mg, 10 mg, Oral, Daily, Maryruth Bun Rama, MD, 10 mg at 04/19/13 1012;  enoxaparin (LOVENOX) injection 40 mg, 40 mg, Subcutaneous, Q24H, Koreen D Morgan, PA-C, 40 mg at 04/18/13 2210 filgrastim (NEUPOGEN) injection 480 mcg, 480 mcg, Subcutaneous, ONCE-1800,  Alison Murray, MD;  fluconazole (DIFLUCAN) IVPB 200 mg, 200 mg, Intravenous, Q24H, Alison Murray, MD, 200 mg at 04/18/13 1505;  furosemide (LASIX) injection 20 mg, 20 mg, Intravenous, Once, Artis Delay, MD;  heparin lock flush 100 unit/mL, 500 Units, Intracatheter, Daily PRN, Artis Delay, MD;  heparin lock flush 100 unit/mL, 250 Units, Intracatheter, PRN, Artis Delay, MD HYDROcodone-homatropine (HYCODAN) 5-1.5 MG/5ML syrup 5 mL, 5 mL, Oral, Q6H PRN, Jinger Neighbors, NP, 5 mL at 04/19/13 1022;  insulin aspart (novoLOG) injection 0-15 Units, 0-15 Units, Subcutaneous, TID WC, Alison Murray, MD;  levalbuterol Pauline Aus) nebulizer solution 0.63 mg, 0.63 mg, Nebulization, TID, Alison Murray, MD, 0.63 mg at 04/19/13 1308 levofloxacin (LEVAQUIN) IVPB 750 mg, 750 mg, Intravenous, Q48H, Alison Murray, MD, 750 mg at 04/18/13 1608;  morphine 2 MG/ML injection 0.5 mg, 0.5 mg, Intravenous, Q4H PRN, Leda Gauze, NP;  multivitamin with minerals tablet 1 tablet, 1 tablet, Oral, Daily, Lavena Bullion, RD, 1 tablet at 04/19/13 1012;  nystatin (MYCOSTATIN) 100000 UNIT/ML suspension 500,000 Units, 5 mL, Oral, QID, Maryruth Bun Rama, MD, 500,000 Units at 04/19/13 1041 ondansetron (ZOFRAN) injection 4 mg, 4 mg, Intravenous, Q6H PRN, Maryruth Bun Rama, MD, 4 mg at 04/15/13 1245;  ondansetron (ZOFRAN) tablet 4 mg, 4 mg, Oral, Q6H PRN, Maryruth Bun Rama, MD;  oxyCODONE (Oxy IR/ROXICODONE) immediate release tablet 5 mg, 5 mg, Oral, Q4H PRN, Maryruth Bun Rama, MD;  pantoprazole (PROTONIX) EC tablet 40 mg, 40 mg, Oral, Daily, Maryruth Bun Rama, MD, 40 mg at 04/19/13 1012 polyethylene glycol (MIRALAX / GLYCOLAX) packet 17 g, 17 g, Oral, Daily  PRN, Maryruth Bun Rama, MD;  sodium chloride 0.9 % injection 10 mL, 10 mL, Intracatheter, PRN, Myra Rude, MD;  sodium chloride 0.9 % injection 10 mL, 10 mL, Intracatheter, PRN, Artis Delay, MD;  sodium chloride 0.9 % injection 3 mL, 3 mL, Intravenous, PRN, Myra Rude, MD;  sodium chloride 0.9 % injection  3 mL, 3 mL, Intracatheter, PRN, Artis Delay, MD   REVIEW OF SYSTEMS:   Constitutional: Denies fevers, chills  Eyes: Denies blurriness of vision Ears, nose, mouth, throat, and face: Denies mucositis or sore throat Respiratory: Denies cough, dyspnea or wheezes Cardiovascular: Denies palpitation, chest discomfort but she does have lower extremity swelling Gastrointestinal:  Denies nausea, heartburn or change in bowel habits Skin: Denies abnormal skin rashes Lymphatics: Denies new lymphadenopathy or easy bruising Neurological:Denies numbness, tingling or new weaknesses Behavioral/Psych: Mood is stable, no new changes  All other systems were reviewed with the patient and are negative.  PHYSICAL EXAMINATION: ECOG PERFORMANCE STATUS: 3 - Symptomatic, >50% confined to bed  Filed Vitals:   04/19/13 0509  BP: 118/71  Pulse: 102  Temp: 98.1 F (36.7 C)  Resp: 20    GENERAL:alert, no distress and comfortable. She debilitated SKIN: skin color, texture, turgor are normal, no rashes or significant lesions EYES: normal, Conjunctiva are pink and non-injected, sclera clear OROPHARYNX:no exudate, no erythema and lips, buccal mucosa, and tongue normal no A. well-healed lesion beneath the left lower lip NECK: supple, thyroid normal size, non-tender, without nodularity LYMPH:  no palpable lymphadenopathy in the cervical, axillary or inguinal LUNGS: clear to auscultation and percussion with mild increased breathing effort HEART: regular rate & rhythm and no murmurs. She does have significant lower extremity edema ABDOMEN:abdomen soft, non-tender and normal bowel sounds Musculoskeletal:no cyanosis of digits and no clubbing  NEURO: alert & oriented x 3 with fluent speech, no focal motor/sensory deficits  LABORATORY DATA:  I have reviewed the data as listed    Component Value Date/Time   NA 133* 04/19/2013 0448   NA 137 04/10/2013 1205   K 3.4* 04/19/2013 0448   K 4.1 04/10/2013 1205   CL 100  04/19/2013 0448   CO2 21 04/19/2013 0448   CO2 29 04/10/2013 1205   GLUCOSE 282* 04/19/2013 0448   GLUCOSE 157* 04/10/2013 1205   BUN 30* 04/19/2013 0448   BUN 17.0 04/10/2013 1205   CREATININE 1.39* 04/19/2013 0448   CREATININE 1.4* 04/10/2013 1205   CREATININE 1.37* 03/26/2013 1338   CALCIUM 7.6* 04/19/2013 0448   CALCIUM 12.3* 04/10/2013 1205   PROT 4.7* 04/19/2013 0448   PROT 6.1* 04/10/2013 1205   ALBUMIN 1.2* 04/19/2013 0448   ALBUMIN 1.7* 04/10/2013 1205   AST 42* 04/19/2013 0448   AST 46* 04/10/2013 1205   ALT 8 04/19/2013 0448   ALT 14 04/10/2013 1205   ALKPHOS 102 04/19/2013 0448   ALKPHOS 223* 04/10/2013 1205   BILITOT 1.0 04/19/2013 0448   BILITOT 1.37* 04/10/2013 1205   GFRNONAA 37* 04/19/2013 0448   GFRAA 43* 04/19/2013 0448    I No results found for this basename: SPEP, UPEP,  kappa and lambda light chains    Lab Results  Component Value Date   WBC 7.7 04/19/2013   NEUTROABS 5.2 04/10/2013   HGB 7.2* 04/19/2013   HCT 23.6* 04/19/2013   MCV 78.7 04/19/2013   PLT 86* 04/19/2013  RADIOGRAPHIC STUDIES: I have personally reviewed the radiological images as listed and agreed with the findings in the report. Ir Fluoro Guide Cv Line Right  04/18/2013   CLINICAL DATA:  Cancer  EXAM: TUNNEL POWER PORT PLACEMENT WITH SUBCUTANEOUS POCKET UTILIZING ULTRASOUND & FLOUROSCOPY  MEDICATIONS AND MEDICAL HISTORY: Versed 2.0 mg, Fentanyl 100 mcg.  Vancomycin was given within two hours of incision. Vancomycin was given due to an antibiotic allergy.  ANESTHESIA/SEDATION: Moderate sedation time: 25 minutes  CONTRAST:  None  FLUOROSCOPY TIME:  1 min and 6 seconds.  PROCEDURE: After written informed consent was obtained, patient was placed in the supine position on angiographic table. The right neck and chest was prepped and draped in a sterile fashion. Lidocaine was utilized for local anesthesia. The right internal jugular vein was noted to be patent initially with ultrasound. Under sonographic guidance, a  micropuncture needle was inserted into the right IJ vein (Ultrasound and fluoroscopic image documentation was performed). The needle was removed over an 018 wire which was exchanged for a Amplatz. This was advanced into the IVC. An 8-French dilator was advanced over the Amplatz.  A small incision was made in the right upper chest over the anterior right second rib. Utilizing blunt dissection, a subcutaneous pocket was created in the caudal direction. The pocket was irrigated with a copious amount of sterile normal saline. The port catheter was tunneled from the chest incision, and out the neck incision. The reservoir was inserted into the subcutaneous pocket and secured with two 3-0 Ethilon stitches. A peel-away sheath was advanced over the Amplatz wire. The port catheter was cut to measure length and inserted through the peel-away sheath. The peel-away sheath was removed. The chest incision was closed with 3-0 Vicryl interrupted stitches for the subcutaneous tissue and a running of 4-0 Vicryl subcuticular stitch for the skin. The neck incision was closed with a 4-0 Vicryl subcuticular stitch. Derma-bond was applied to both surgical incisions. The port reservoir was flushed and instilled with heparinized saline. No complications.  FINDINGS: A right IJ vein Port-A-Cath is in place with its tip at the cavoatrial junction.  IMPRESSION: Successful 8 French right internal jugular vein power port placement with its tip at the SVC/RA junction.   Electronically Signed   By: Maryclare Bean M.D.   On: 04/18/2013 09:39   Ir US Guide Vasc Access Right  04/18/2013   CLINICAL DATA:  Cancer  EXAM: TUNNEL POWER PORT PLACEMENT WITH SUBCUTANEOUS POCKET UTILIZING ULTRASOUND & FLOUROSCOPY  MEDICATIONS AND MEDICAL HISTORY: Versed 2.0 mg, Fentanyl 100 mcg.  Vancomycin was given within two hours of incision. Vancomycin was given due to an antibiotic allergy.  ANESTHESIA/SEDATION: Moderate sedation time: 25 minutes  CONTRAST:  None   FLUOROSCOPY TIME:  1 min and 6 seconds.  PROCEDURE: After written informed consent was obtained, patient was placed in the supine position on angiographic table. The right neck and chest was prepped and draped in a sterile fashion. Lidocaine was utilized for local anesthesia. The right internal jugular vein was noted to be patent initially with ultrasound. Under sonographic guidance, a micropuncture needle was inserted into the right IJ vein (Ultrasound and fluoroscopic image documentation was performed). The needle was removed over an 018 wire which was exchanged for a Amplatz. This was advanced into the IVC. An 8-French dilator was advanced over the Amplatz.  A small incision was made in the right upper chest over the anterior right second rib. Utilizing blunt dissection, a subcutaneous pocket was created in the caudal direction. The pocket was irrigated with a copious amount of sterile normal saline. The port catheter was tunneled from the chest incision, and out  the neck incision. The reservoir was inserted into the subcutaneous pocket and secured with two 3-0 Ethilon stitches. A peel-away sheath was advanced over the Amplatz wire. The port catheter was cut to measure length and inserted through the peel-away sheath. The peel-away sheath was removed. The chest incision was closed with 3-0 Vicryl interrupted stitches for the subcutaneous tissue and a running of 4-0 Vicryl subcuticular stitch for the skin. The neck incision was closed with a 4-0 Vicryl subcuticular stitch. Derma-bond was applied to both surgical incisions. The port reservoir was flushed and instilled with heparinized saline. No complications.  FINDINGS: A right IJ vein Port-A-Cath is in place with its tip at the cavoatrial junction.  IMPRESSION: Successful 8 French right internal jugular vein power port placement with its tip at the SVC/RA junction.   Electronically Signed   By: Maryclare Bean M.D.   On: 04/18/2013 09:39   ASSESSMENT: Non-Hodgkin  lymphoma, anemia, cachexia, diffuse edema   PLAN:  #1 non-Hodgkin lymphoma Staging from bone marrow aspirate and biopsy is still pending. She received cycle 1 of CHOP chemotherapy yesterday with no side effects. We will continue aggressive supportive care #2 anemia She denies any recent bleeding P. with this is likely a combination of recent microcytic anemia from possible bleeding on top of her diagnosis of lymphoma causing anemia chronic disease. She has received one unit of blood transfusion 3 days ago and is currently anemic again. I recommend 2 units of blood transfusion to her today. We discussed some of the risks benefits side effects of transfusion as she is in agreement to proceed #3 thrombocytopenia This could be related to recent chemotherapy. Watch for signs and symptoms of bleeding carefully. We will hold the mouth if her platelet count dropped to less than 50,000 #4 mild edema This is likely secondary to low albumin. I recommend observation. After blood transfusion I do want to take her Lasix Cachexia She had very poor appetite. I recommend increased oral intake with frequent small meals.  All questions were answered. No barriers to learning was detected.  Kymberly Blomberg, MD @T @ 10:52 AM

## 2013-04-20 ENCOUNTER — Ambulatory Visit: Payer: Medicare Other

## 2013-04-20 ENCOUNTER — Other Ambulatory Visit: Payer: Self-pay | Admitting: Certified Registered Nurse Anesthetist

## 2013-04-20 ENCOUNTER — Other Ambulatory Visit: Payer: Medicare Other | Admitting: Lab

## 2013-04-20 DIAGNOSIS — B370 Candidal stomatitis: Secondary | ICD-10-CM

## 2013-04-20 DIAGNOSIS — R739 Hyperglycemia, unspecified: Secondary | ICD-10-CM | POA: Diagnosis not present

## 2013-04-20 DIAGNOSIS — J189 Pneumonia, unspecified organism: Secondary | ICD-10-CM | POA: Diagnosis present

## 2013-04-20 DIAGNOSIS — D638 Anemia in other chronic diseases classified elsewhere: Secondary | ICD-10-CM | POA: Diagnosis present

## 2013-04-20 LAB — GLUCOSE, CAPILLARY
Glucose-Capillary: 291 mg/dL — ABNORMAL HIGH (ref 70–99)
Glucose-Capillary: 358 mg/dL — ABNORMAL HIGH (ref 70–99)
Glucose-Capillary: 242 mg/dL — ABNORMAL HIGH (ref 70–99)

## 2013-04-20 LAB — COMPREHENSIVE METABOLIC PANEL
ALT: 10 U/L (ref 0–35)
AST: 44 U/L — ABNORMAL HIGH (ref 0–37)
Albumin: 1.2 g/dL — ABNORMAL LOW (ref 3.5–5.2)
CO2: 20 mEq/L (ref 19–32)
Calcium: 7.7 mg/dL — ABNORMAL LOW (ref 8.4–10.5)
Creatinine, Ser: 1.69 mg/dL — ABNORMAL HIGH (ref 0.50–1.10)
GFR calc non Af Amer: 29 mL/min — ABNORMAL LOW (ref >90)
Potassium: 3.9 mEq/L (ref 3.5–5.1)
Sodium: 134 mEq/L — ABNORMAL LOW (ref 135–145)
Total Protein: 4.6 g/dL — ABNORMAL LOW (ref 6.0–8.3)

## 2013-04-20 LAB — TYPE AND SCREEN: Antibody Screen: NEGATIVE

## 2013-04-20 LAB — CBC
MCH: 25.7 pg — ABNORMAL LOW (ref 26.0–34.0)
MCV: 79.7 fL (ref 78.0–100.0)
Platelets: 89 10*3/uL — ABNORMAL LOW (ref 150–400)
RBC: 3.7 MIL/uL — ABNORMAL LOW (ref 3.87–5.11)
RDW: 17.1 % — ABNORMAL HIGH (ref 11.5–15.5)
WBC: 28.6 10*3/uL — ABNORMAL HIGH (ref 4.0–10.5)

## 2013-04-20 LAB — URIC ACID: Uric Acid, Serum: 1.7 mg/dL — ABNORMAL LOW (ref 2.4–7.0)

## 2013-04-20 LAB — MAGNESIUM: Magnesium: 1.6 mg/dL (ref 1.5–2.5)

## 2013-04-20 LAB — PHOSPHORUS: Phosphorus: 2.8 mg/dL (ref 2.3–4.6)

## 2013-04-20 MED ORDER — RESOURCE INSTANT PROTEIN PO PWD PACKET
1.0000 | Freq: Three times a day (TID) | ORAL | Status: DC
  Administered 2013-04-20 – 2013-04-24 (×13): 6 g via ORAL
  Filled 2013-04-20 (×15): qty 6

## 2013-04-20 MED ORDER — LEVALBUTEROL HCL 0.63 MG/3ML IN NEBU
0.6300 mg | INHALATION_SOLUTION | Freq: Four times a day (QID) | RESPIRATORY_TRACT | Status: DC | PRN
  Administered 2013-04-20: 0.63 mg via RESPIRATORY_TRACT

## 2013-04-20 MED ORDER — BENEPROTEIN PO POWD: 1.0000 | Freq: Three times a day (TID) | ORAL | Status: DC

## 2013-04-20 NOTE — Care Management (Addendum)
Cm spoke with patient and spouse at bedside concerning discharge planning. Per pt "unable to make decision at this time". Per pt's spouse pt was on bedrest after chemo and was unable to participate in therapy. Pt and family would like pt to be able to self ambulate to bathroom prior to discharging home. Pt and spouse anticipate pt to work with therapy today. Per pt and spouse disposition includes home with [facility] services. Pt and spouse informed CSW resource available with home care to assist in transition to a SNF if family unable to manage pt's care appropriately at home.Pt previously active [facility]. No DME request. Will continue to follow.

## 2013-04-20 NOTE — Progress Notes (Signed)
[facility] PROGRESS NOTE  Tonya Wise WJX:[id number] DOB: 1940/09/08 DOA: 04/10/2013 PCP: [name], MD  Brief narrative: 72 year old female with past medical history of recently diagnosed non-Hodgkin's lymphoma who was admitted to [facility] ED 04/10/2013 with dehydration, acute renal failure, hypercalcemia. Hospital course has been complicated due to lethargy and increasing confusion. Patient mental status has improved. Pt had 1st chemotherapy 04/18/2013 and tolerated it well. Port-a-cath placed 04/17/2013. Hospital course is complicated due to progressive physical deconditioning and anorexia.  Assessment/Plan:   Principal Problem:  Non-Hodgkins lymphoma with fever/rigors, newly diagnosed  - Patient initially started on prednisone 04/11/2013. Due to the increasing lethargy and confusion prednisone was put on hold. As part of chemo it was restarted. Now 48 hours after, patient's mental status still good, alert and oriented to time, place and person  - Patient had bone marrow biopsy of left iliac crest done 04/15/2013 - cytology positive for lymphoma  - Continue allopurinol (high risk of tumor lysis syndrome)  - port-a-cath placed with no complications.   Active Problems:  Hypercalcemia of malignancy  - Patient was given Pamidronate and Lasix 04/12/2013 as well as calcitonin with calcium now being within normal limits  - Calcitonin now discontinued  Hospital acquired pneumonia  - CXR showed bibasilar pneumonia  - blood cultures to date are negative  - continue Levaquin and discontinue tomorrow Anemia of chronic disease  - Hemoglobin 6.3 on 04/15/2013 and pt received 1 unit PRBC and then 2 more units PRBC given 04/20/2103 - Hemoglobin 9.5 this morning Leukocytosis - Secondary to Neupogen Thrombocytopenia  - platelets 112 and now after chemo 86 and subsequently 89 - No signs of acute bleed - Continue to monitor CBC Acute metabolic encephalopathy - Likely a combination of  pneumonia, steroids and hypercalcemia.  - mental status at baseline  Thrush  - Continue fluconazole 200 mg IV daily  Hypokalemia / hypophosphatemia  - Repleted with Mag ox and K-Phos by mouth  - continue to replace electrolytes as needed  Anorexia / severe protein calorie malnutrition  - continue nutritional supplements  - encourage PO intake  Grade I diastolic dysfunction  - Stable  - Given so far 2 dose of lasix 40 mg IV due to worsening LE edema  Acute renal failure  - Secondary to dehydration.  - Creatinine likely trending up because of Lasix - continue to monitor renal function  Elevated LFTs / hepatic steatosis  - Hepatic steatosis noted on CT abdomen/pelvis done 03/30/13.   Code Status: DNR/DNI  Family Communication: [name] (husband) [phone] (cell), [phone] (home). Husband updated at bedside.  Disposition Plan: Home when stable.   Medical Consultants:  Dr. Myra Rude, Oncology Other Consultants:  Dietitian Anti-infectives:  Cefepime and vanco started 04/15/2013 and d/c'ed today 9/20  Levaquin 9/20 2014 -->   Manson Passey, MD  [facility]  Pager [phone]   If 7PM-7AM, please contact night-coverage [email or web address] Password [facility] 04/20/2013, 7:10 AM   LOS: 10 days    HPI/Subjective: No acute overnight events. Feels very weak, tired.  Objective: Filed Vitals:   04/19/13 1932 04/19/13 2023 04/19/13 2045 04/20/13 0450  BP:  123/74 124/73 124/76  Pulse:  99 98 102  Temp:  98.1 F (36.7 C) 98.1 F (36.7 C) 97.7 F (36.5 C)  TempSrc:  Oral Oral Oral  Resp:  18 18 24   Height:      Weight:      SpO2: 96% 98% 98% 92%    Intake/Output Summary (Last 24 hours) at  04/20/13 0710 Last data filed at 04/20/13 0700  Gross per 24 hour  Intake 1256.67 ml  Output    750 ml  Net 506.67 ml    Exam:   General:  Pt is alert, follows commands appropriately, not in acute distress  Cardiovascular: Regular rate and rhythm, S1/S2, no murmurs, no rubs, no  gallops  Respiratory: Clear to auscultation bilaterally, no wheezing, no crackles, no rhonchi  Abdomen: Soft, non tender, non distended, bowel sounds present, no guarding  Extremities: Lower extremity +1-2 pitting edema, pulses DP and PT palpable bilaterally  Neuro: Grossly nonfocal  Data Reviewed: Basic Metabolic Panel:  Recent Labs Lab 04/16/13 0353 04/17/13 0450 04/18/13 0510 04/19/13 0448 04/20/13 0410  NA 139 140 139  140 133* 134*  K 3.5 3.5 3.4*  3.4* 3.4* 3.9  CL 104 105 104  104 100 102  CO2 23 22 24  24 21 20   GLUCOSE 222* 218* 203*  207* 282* 302*  BUN 15 18 22  22  30* 42*  CREATININE 0.95 1.01 1.23*  1.25* 1.39* 1.69*  CALCIUM 9.0 8.5 8.1*  8.3* 7.6* 7.7*  MG 1.5 1.6 1.6 1.6 1.6  PHOS 1.3* 2.4 2.5 2.4 2.8   Liver Function Tests:  Recent Labs Lab 04/18/13 0510 04/19/13 0448 04/20/13 0410  AST 40* 42* 44*  ALT 8 8 10   ALKPHOS 104 102 124*  BILITOT 1.2 1.0 1.1  PROT 5.3* 4.7* 4.6*  ALBUMIN 1.3* 1.2* 1.2*   No results found for this basename: LIPASE, AMYLASE,  in the last 168 hours No results found for this basename: AMMONIA,  in the last 168 hours CBC:  Recent Labs Lab 04/15/13 0349 04/15/13 1056 04/17/13 0450 04/18/13 0510 04/19/13 0448 04/20/13 0410  WBC 6.1  --  8.7 9.5 7.7 28.6*  HGB 6.9* 6.3* 8.2* 8.1* 7.2* 9.5*  HCT 23.0* 21.5* 27.2* 26.8* 23.6* 29.5*  MCV 78.2  --  79.1 79.5 78.7 79.7  PLT 133*  --  112* 110* 86* 89*   Cardiac Enzymes: No results found for this basename: CKTOTAL, CKMB, CKMBINDEX, TROPONINI,  in the last 168 hours BNP: No components found with this basename: POCBNP,  CBG:  Recent Labs Lab 04/19/13 0850 04/19/13 1156 04/19/13 1636 04/19/13 2149  GLUCAP 272* 315* 333* 296*    Recent Results (from the past 240 hour(s))  CULTURE, BLOOD (ROUTINE X 2)     Status: None   Collection Time    04/10/13  8:05 PM      Result Value Range Status   Specimen Description BLOOD RIGHT ARM   Final   Special  Requests BOTTLES DRAWN AEROBIC AND ANAEROBIC 10CC   Final   Culture  Setup Time     Final   Value: 04/11/2013 02:12     Performed at [facility]   Culture     Final   Value: NO GROWTH 5 DAYS     Performed at [facility]   Report Status 04/17/2013 FINAL   Final  CULTURE, BLOOD (ROUTINE X 2)     Status: None   Collection Time    04/10/13  8:10 PM      Result Value Range Status   Specimen Description BLOOD RIGHT HAND   Final   Special Requests BOTTLES DRAWN AEROBIC AND ANAEROBIC 10CC   Final   Culture  Setup Time     Final   Value: 04/11/2013 02:12     Performed at [facility]  Final   Value: NO GROWTH 5 DAYS     Performed at [facility]   Report Status 04/17/2013 FINAL   Final  CULTURE, BLOOD (ROUTINE X 2)     Status: None   Collection Time    04/15/13  3:43 PM      Result Value Range Status   Specimen Description BLOOD LEFT HAND   Final   Special Requests BOTTLES DRAWN AEROBIC ONLY 3CC   Final   Culture  Setup Time     Final   Value: 04/16/2013 03:42     Performed at [facility]   Culture     Final   Value:        BLOOD CULTURE RECEIVED NO GROWTH TO DATE CULTURE WILL BE HELD FOR 5 DAYS BEFORE ISSUING A FINAL NEGATIVE REPORT     Performed at [facility]   Report Status PENDING   Incomplete  CULTURE, BLOOD (ROUTINE X 2)     Status: None   Collection Time    04/15/13  3:47 PM      Result Value Range Status   Specimen Description BLOOD RIGHT HAND   Final   Special Requests BOTTLES DRAWN AEROBIC ONLY 3CC   Final   Culture  Setup Time     Final   Value: 04/16/2013 03:42     Performed at [facility]   Culture     Final   Value:        BLOOD CULTURE RECEIVED NO GROWTH TO DATE CULTURE WILL BE HELD FOR 5 DAYS BEFORE ISSUING A FINAL NEGATIVE REPORT     Performed at [facility]   Report Status PENDING   Incomplete     Studies: No results found.  Scheduled Meds: . allopurinol  300 mg Oral BID   . antiseptic oral rinse  15 mL Mouth Rinse BID  . aspirin EC  81 mg Oral Daily  . bisoprolol  10 mg Oral Daily  . enoxaparin (LOVENOX) injection  40 mg Subcutaneous Q24H  . fluconazole (DIFLUCAN) IV  200 mg Intravenous Q24H  . insulin aspart  0-15 Units Subcutaneous TID WC  . insulin aspart  5 Units Subcutaneous TID WC  . levalbuterol  0.63 mg Nebulization TID  . levofloxacin (LEVAQUIN) IV  750 mg Intravenous Q48H  . multivitamin with minerals  1 tablet Oral Daily  . nystatin  5 mL Oral QID  . pantoprazole  40 mg Oral Daily   Continuous Infusions: . sodium chloride 20 mL/hr at 04/19/13 1012

## 2013-04-20 NOTE — Evaluation (Signed)
Physical Therapy Evaluation Patient Details Name: Tonya Wise MRN: [id number] DOB: 1941/07/07 Today's Date: 04/20/2013 Time: 1110-1130 PT Time Calculation (min): 20 min  PT Assessment / Plan / Recommendation History of Present Illness  Tonya Wise is an 72 y.o. female with a PMH of newly diagnosed NHL during recent hospitalization 03/27/13-03/31/13 where she had originally presented for evaluation of generalized weakness and anemia.  She ultimately underwent right inguinal lymph node biopsy confirming the diagnosis. Since discharge, she has had some intermittent fatigue and fevers/night sweats Pt has begun chemotherapy  Clinical Impression  Pt has anasarca and c/o that legs are too heavy to lift. She was able to initiate to get to edge of bed and into sitting, but needed some assist. Discussed with pt and husband about HHPT and possibly type of wheelchair or transport chair a home.  Husband reports they may be able to borrow one.  They may also want to consider a hospital bed so that she could raise the head of bed to assist with breathing.    PT Assessment  Patient needs continued PT services    Follow Up Recommendations  Home health PT    Does the patient have the potential to tolerate intense rehabilitation      Barriers to Discharge        Equipment Recommendations  3in1 (PT);Wheelchair (measurements PT);Hospital bed (husband will check about getting a wheelchair.  ? bed)    Recommendations for Other Services OT consult   Frequency Min 3X/week    Precautions / Restrictions Precautions Precautions: Fall   Pertinent Vitals/Pain Pt c/o pain in legs from edema      Mobility  Bed Mobility Bed Mobility: Supine to Sit Supine to Sit: 3: Mod assist;HOB elevated Details for Bed Mobility Assistance: pt is able to initiate mobility by moving legs to edge of bed and assisting with supine to sit. Transfers Transfers: Sit to Stand;Stand to Sit;Squat Pivot Transfers Sit to Stand:  3: Mod assist Stand to Sit: 3: Mod assist Squat Pivot Transfers: 3: Mod assist;From elevated surface Details for Transfer Assistance: repeated sit to stand x3  Ambulation/Gait Assistive device: Rolling walker    Exercises General Exercises - Lower Extremity Ankle Circles/Pumps: AROM;Both;5 reps Quad Sets: AROM;Both;5 reps Short Arc Quad: AROM;Both;5 reps   PT Diagnosis: Difficulty walking;Generalized weakness  PT Problem List: Decreased strength;Decreased activity tolerance;Decreased mobility;Decreased knowledge of use of DME;Decreased safety awareness;Other (comment) (anasarca) PT Treatment Interventions: DME instruction;Gait training;Stair training;Functional mobility training;Therapeutic activities;Therapeutic exercise;Patient/family education     PT Goals(Current goals can be found in the care plan section) Acute Rehab PT Goals Patient Stated Goal: to take patient home PT Goal Formulation: With patient/family Time For Goal Achievement: 05/04/13 Potential to Achieve Goals: Good  Visit Information  Last PT Received On: 04/20/13 Assistance Needed: +2 History of Present Illness: Tonya Wise is an 72 y.o. female with a PMH of newly diagnosed NHL during recent hospitalization 03/27/13-03/31/13 where she had originally presented for evaluation of generalized weakness and anemia.  She ultimately underwent right inguinal lymph node biopsy confirming the diagnosis. Since discharge, she has had some intermittent fatigue and fevers/night sweats       Prior Functioning  Home Living Family/patient expects to be discharged to:: Private residence Living Arrangements: Spouse/significant other Available Help at Discharge: Family Type of Home: House Home Layout: Two level;Able to live on main level with bedroom/bathroom Alternate Level Stairs-Number of Steps: 2 Home Equipment: Walker - 2 wheels Prior Function Level of Independence: Independent  with assistive  device(s) Communication Communication: No difficulties    Cognition  Cognition Arousal/Alertness: Awake/alert Behavior During Therapy: WFL for tasks assessed/performed Overall Cognitive Status: Within Functional Limits for tasks assessed    Extremity/Trunk Assessment Upper Extremity Assessment Upper Extremity Assessment: Defer to OT evaluation (edema present) Lower Extremity Assessment Lower Extremity Assessment: RLE deficits/detail;LLE deficits/detail;Generalized weakness RLE Deficits / Details: edema in  both legs. pt is able to activate all muscle groups but has difficulty lifting legs due to weight from edema RLE Sensation: decreased light touch;decreased proprioception LLE Deficits / Details: as above LLE Sensation: decreased light touch;decreased proprioception   Balance Balance Balance Assessed: Yes Static Sitting Balance Static Sitting - Balance Support: No upper extremity supported;Feet supported Static Sitting - Level of Assistance: 5: Stand by assistance Static Standing Balance Static Standing - Balance Support: Bilateral upper extremity supported;During functional activity Static Standing - Level of Assistance: 5: Stand by assistance Static Standing - Comment/# of Minutes: pt able to stand for several minutes for hygiene and to have bsc removed and chair brought up under patient  End of Session PT - End of Session Activity Tolerance: Patient tolerated treatment well Patient left: in chair Nurse Communication: Mobility status  GP    Tonya Wise, [place] [phone] 04/20/2013, 1:05 PM

## 2013-04-20 NOTE — Progress Notes (Signed)
Clinical Social Work Department BRIEF PSYCHOSOCIAL ASSESSMENT 04/20/2013  Patient:  Tonya Wise, Tonya Wise     Account Number:  [id number]     Admit date:  04/10/2013  Clinical Social Worker:  Jacelyn Grip  Date/Time:  04/20/2013 09:45 AM  Referred by:  Physician  Date Referred:  04/20/2013 Referred for  SNF Placement   Other Referral:   Interview type:  Patient Other interview type:   and patient husband at bedside.    PSYCHOSOCIAL DATA Living Status:  HUSBAND Admitted from facility:   Level of care:   Primary support name:  Tonya Wise/son/[phone] Primary support relationship to patient:  SPOUSE Degree of support available:   strong    CURRENT CONCERNS Current Concerns  Post-Acute Placement   Other Concerns:    SOCIAL WORK ASSESSMENT / PLAN CSW received referral for New SNF.    Pt has not yet participated with PT evaluations.    CSW and RNCM met with pt and pt husband at bedside to discuss disposition needs.    CSW and RNCM explained role and discussed options including home with home health services and rehab at [facility]. Pt displayed hesitency when discussing d/c plan stating that she does not want to make any decisions at this time as she has a "long road ahead" and pt discussed that she does not want to feel pressured to make decisions. CSW explained that CSW and RNCM were present to assist the pt as per MD, pt is likely ready for d/c in the next few days and CSW and RNCM want to ensure that pt has all the resources she needs to make a decision and attempt to prevent pt from feeling pressure on the day that pt is medically ready for discharge in order to have a plan in place by that day.    Pt stated that she wishes to return home, but is not yet ready to make decisions about what she may need at home.    CSW and RNCM notified pt and pt spouse that CSW and RNCM were available to assist with disposition needs and available when pt ready to further discuss decisions.    PT  will be working with pt today and CSW will continue to follow to assist as appropriate in the case that pt and pt family change their mind about pt returning home.    CSW to continue to follow   Assessment/plan status:  Psychosocial Support/Ongoing Assessment of Needs Other assessment/ plan:   discharge planning   Information/referral to community resources:   RNCM following    PATIENT'S/FAMILY'S RESPONSE TO PLAN OF CARE: Pt alert and oriented x 4. Pt husband reports that pt and pt family are not considering SNF at this time and pt wishes to return home upon discharge. Pt withdrawn from making and detailed decisions re: discharge plan at this time.     Jacklynn Lewis, MSW, LCSWA  Clinical Social Work [phone]

## 2013-04-20 NOTE — Progress Notes (Signed)
NUTRITION FOLLOW UP  Intervention:   - Do not expect pt's PO intake and nutritional status to improve anytime soon, pt on day 10 of inadequate oral intake with pt c/o that she "just can't eat" r/t extreme pain with PO intake. Continue to recommend enteral nutrition. 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 Discussed recommendations in rounds and with hospitalist.  - Pt's weight up 21 pounds since admission. Noted pt with +3 RUE, LUE, + 2 RLE, LLE edema. Management per MD.  - Beneprotein TID - Will continue to monitor   Nutrition Dx:   Inadequate oral intake related to NH lymphoma as evidenced by reported weight loss and intake less than estimated needs - ongoing    Goal:   Pt to meet >/= 90% of their estimated nutrition needs - not met   Monitor:   Weights, labs, intake, goals of care, TF  Assessment:   Pt discussed during multidisciplinary rounds.   Met with pt and husband this morning. Husband had brought in [place], however pt unable to eat it. Pt reports "I just can't eat". Pt states when she tries to eat solid food, she has "extreme pain". Reports she is able to drink Gatorade, could not drink Ensure/Glucerna shakes. Discussed adding Beneprotein powder to Gatorade which pt agreeable to. Pt reports the only thing she has been able to eat over the weekend has been a baked potato. Pt c/o intolerance to hot drinks. Noted pt with puffy hands, pt reports she was told by her doctor it was because she was not getting enough protein. Pt's weight up 21 pounds since admission. Noted pt with +3 RUE, LUE, + 2 RLE, LLE edema. Pt with elevated BUN/Cr trending up and GFR worsening. Pt with elevated Alk phos and AST.   Per discussion with pharmacist and per MD notes, chemotherapy dosage decreased r/t pt with  "poor nutritional reserve". Pt given cycle 1 of CHOP chemotherapy 9/20. Oncologist over the weekend noted pt with very poor appetite.   Height: Ht Readings from Last 1 Encounters:  04/10/13 5\' 2"  (1.575 m)    Weight Status:   Wt Readings from Last 1 Encounters:  04/17/13 176 lb 9.4 oz (80.1 kg)    Re-estimated needs:  Kcal: 1500-1750 Protein: 75-85 g  Fluid: 1.5-1.7 L   Skin: incision on right groin 03/31/13, +3 RUE, LUE, + 2 RLE, LLE edema   Diet Order: General   Intake/Output Summary (Last 24 hours) at 04/20/13 1137 Last data filed at 04/20/13 0700  Gross per 24 hour  Intake 1136.67 ml  Output    750 ml  Net 386.67 ml    Last BM: 9/19   Labs:   Recent Labs Lab 04/18/13 0510 04/19/13 0448 04/20/13 0410  NA 139  140 133* 134*  K 3.4*  3.4* 3.4* 3.9  CL 104  104 100 102  CO2 24  24 21 20   BUN 22  22 30* 42*  CREATININE 1.23*  1.25* 1.39* 1.69*  CALCIUM 8.1*  8.3* 7.6* 7.7*  MG 1.6 1.6 1.6  PHOS 2.5 2.4 2.8  GLUCOSE 203*  207* 282* 302*    CBG (last 3)   Recent Labs  04/19/13 1636 04/19/13 2149 04/20/13 0805  GLUCAP 333* 296* 291*    Scheduled Meds: . allopurinol  300 mg Oral  BID  . antiseptic oral rinse  15 mL Mouth Rinse BID  . aspirin EC  81 mg Oral Daily  . bisoprolol  10 mg Oral Daily  . enoxaparin (LOVENOX) injection  40 mg Subcutaneous Q24H  . fluconazole (DIFLUCAN) IV  200 mg Intravenous Q24H  . insulin aspart  0-15 Units Subcutaneous TID WC  . insulin aspart  5 Units Subcutaneous TID WC  . levalbuterol  0.63 mg Nebulization TID  . levofloxacin (LEVAQUIN) IV  750 mg Intravenous Q48H  . multivitamin with minerals  1 tablet Oral Daily  . nystatin  5 mL Oral QID  . pantoprazole  40 mg Oral Daily    Continuous Infusions: . sodium chloride 20 mL/hr at 04/19/13 [address] [place], [address], [place] [phone] Pager [phone] After Hours Pager

## 2013-04-21 ENCOUNTER — Telehealth: Payer: Self-pay | Admitting: Dietician

## 2013-04-21 DIAGNOSIS — D638 Anemia in other chronic diseases classified elsewhere: Secondary | ICD-10-CM

## 2013-04-21 LAB — COMPREHENSIVE METABOLIC PANEL
Albumin: 1.2 g/dL — ABNORMAL LOW (ref 3.5–5.2)
CO2: 20 mEq/L (ref 19–32)
Calcium: 7.6 mg/dL — ABNORMAL LOW (ref 8.4–10.5)
Creatinine, Ser: 1.79 mg/dL — ABNORMAL HIGH (ref 0.50–1.10)
GFR calc Af Amer: 32 mL/min — ABNORMAL LOW (ref >90)
GFR calc non Af Amer: 27 mL/min — ABNORMAL LOW (ref >90)
Glucose, Bld: 315 mg/dL — ABNORMAL HIGH (ref 70–99)
Potassium: 4.2 mEq/L (ref 3.5–5.1)
Total Protein: 4.3 g/dL — ABNORMAL LOW (ref 6.0–8.3)

## 2013-04-21 LAB — GLUCOSE, CAPILLARY
Glucose-Capillary: 371 mg/dL — ABNORMAL HIGH (ref 70–99)
Glucose-Capillary: 292 mg/dL — ABNORMAL HIGH (ref 70–99)

## 2013-04-21 LAB — MAGNESIUM: Magnesium: 1.7 mg/dL (ref 1.5–2.5)

## 2013-04-21 LAB — CBC
Hemoglobin: 8.8 g/dL — ABNORMAL LOW (ref 12.0–15.0)
MCH: 26.5 pg (ref 26.0–34.0)
MCV: 80.1 fL (ref 78.0–100.0)
RBC: 3.32 MIL/uL — ABNORMAL LOW (ref 3.87–5.11)
RDW: 17.7 % — ABNORMAL HIGH (ref 11.5–15.5)
WBC: 22.6 10*3/uL — ABNORMAL HIGH (ref 4.0–10.5)

## 2013-04-21 MED ORDER — ENOXAPARIN SODIUM 30 MG/0.3ML ~~LOC~~ SOLN
30.0000 mg | SUBCUTANEOUS | Status: DC
  Filled 2013-04-21: qty 0.3

## 2013-04-21 MED ORDER — ALLOPURINOL 300 MG PO TABS
300.0000 mg | ORAL_TABLET | Freq: Every day | ORAL | Status: DC
  Filled 2013-04-21: qty 1

## 2013-04-21 MED ORDER — FUROSEMIDE 10 MG/ML IJ SOLN
40.0000 mg | Freq: Once | INTRAMUSCULAR | Status: AC
  Administered 2013-04-21: 40 mg via INTRAVENOUS
  Filled 2013-04-21: qty 4

## 2013-04-21 NOTE — Progress Notes (Signed)
Physical Therapy Treatment Patient Details Name: Tonya Wise MRN: [id number] DOB: [birth year] Today's Date: 04/21/2013 Time: 8657-8469 PT Time Calculation (min): 23 min  PT Assessment / Plan / Recommendation  History of Present Illness pt with gradual improvement   PT Comments   Pt improved in mobility today  Follow Up Recommendations  Home health PT     Does the patient have the potential to tolerate intense rehabilitation     Barriers to Discharge        Equipment Recommendations  3in1 (PT);Wheelchair (measurements PT);Hospital bed (husband will check about getting a wheelchair.  ? bed)    Recommendations for Other Services OT consult  Frequency Min 3X/week   Progress towards PT Goals Progress towards PT goals: Progressing toward goals  Plan      Precautions / Restrictions     Pertinent Vitals/Pain C/o pain in legs and arms from swelling    Mobility  Bed Mobility Bed Mobility: Supine to Sit Supine to Sit: 3: Mod assist;HOB elevated Details for Bed Mobility Assistance: pt is able to initiate mobility by moving legs to edge of bed and assisting with supine to sit using flat spin technique Transfers Transfers: Sit to Stand;Stand to Sit Sit to Stand: 3: Mod assist Stand to Sit: 3: Mod assist Details for Transfer Assistance: repeated sit to stand x3  more assist needed from lower surface Ambulation/Gait Ambulation/Gait Assistance: 3: Mod assist Assistive device: Rolling walker Ambulation/Gait Assistance Details: 15 (x2) Gait Pattern: Step-through pattern;Decreased step length - right;Decreased step length - left Gait velocity: decr General Gait Details: pt is able to support self on arms and weight shift in order to take steps Stairs: No Wheelchair Mobility Wheelchair Mobility: No    Exercises General Exercises - Lower Extremity Ankle Circles/Pumps: AROM;Both;5 reps Quad Sets: AROM;Both;5 reps Short Arc Quad: AROM;Both;5 reps Hip ABduction/ADduction:  AAROM;Both;5 reps;Supine Hip Flexion/Marching: AAROM;Both;5 reps;Supine Other Exercises Other Exercises: UE elbow flexion/extension and supination/pronation for and elevation for edema control   PT Diagnosis:    PT Problem List:   PT Treatment Interventions:     PT Goals (current goals can now be found in the care plan section)    Visit Information  Last PT Received On: 04/21/13 Assistance Needed: +2 History of Present Illness: pt with gradual improvement    Subjective Data      Cognition  Cognition Arousal/Alertness: Awake/alert Behavior During Therapy: WFL for tasks assessed/performed Overall Cognitive Status: Within Functional Limits for tasks assessed    Balance  Balance Balance Assessed: Yes Static Sitting Balance Static Sitting - Balance Support: No upper extremity supported;Feet supported Static Sitting - Level of Assistance: 5: Stand by assistance Static Standing Balance Static Standing - Balance Support: Bilateral upper extremity supported;During functional activity Static Standing - Level of Assistance: 5: Stand by assistance Static Standing - Comment/# of Minutes: improving in core activation in standing  End of Session PT - End of Session Equipment Utilized During Treatment: Gait belt Activity Tolerance: Patient tolerated treatment well Patient left: in chair Nurse Communication: Mobility status   GP     Donnetta Hail 04/21/2013, 3:51 PM

## 2013-04-21 NOTE — Progress Notes (Signed)
Inpatient Diabetes Program Recommendations  AACE/ADA: New Consensus Statement on Inpatient Glycemic Control (2013)  Target Ranges:  Prepandial:   less than 140 mg/dL      Peak postprandial:   less than 180 mg/dL (1-2 hours)      Critically ill patients:  140 - 180 mg/dL   Reason for Visit: Hyperglycemia  Results for Tonya Wise, Tonya Wise (MRN [id number]) as of 04/21/2013 12:46  Ref. Range 04/19/2013 21:49 04/20/2013 08:05 04/20/2013 11:54 04/20/2013 16:44 04/20/2013 21:24 04/21/2013 08:37 04/21/2013 12:08  Glucose-Capillary Latest Range: 70-99 mg/dL 102 (H) 725 (H) 366 (H) 321 (H) 242 (H) 371 (H) 292 (H)     Blood sugars 242-371 in past 36 hours.   Please consider addition of basal insulin - Lantus 10 units QHS Please add HS correction insulin.  Will continue to follow. Thank you. Ailene Ards, RD, LDN, CDE Inpatient Diabetes Coordinator [phone]

## 2013-04-21 NOTE — Progress Notes (Signed)
CSW continuing to follow.  CSW noted that plan is for pt to return home with home health services.  CSW to continue to follow to provide support and be available to assist with potential need for transportation home when pt medically ready for discharge.  Jacklynn Lewis, MSW, LCSWA  Clinical Social Work [phone]

## 2013-04-21 NOTE — Progress Notes (Addendum)
[facility] PROGRESS NOTE  Tonya Wise ZOX:[id number] DOB: 30-May-1941 DOA: 04/10/2013 PCP: [name], MD  Brief narrative: 72 year old female with past medical history of recently diagnosed non-Hodgkin's lymphoma who was admitted to [facility] ED 04/10/2013 with dehydration, acute renal failure, hypercalcemia. Hospital course has been complicated due to lethargy and increasing confusion. Patient mental status has improved. Pt had 1st chemotherapy 04/18/2013 and tolerated it well. Port-a-cath was placed 04/17/2013. Hospital course is complicated due to progressive physical deconditioning and anorexia.   Assessment/Plan:   Principal Problem:  Non-Hodgkins lymphoma with fever/rigors, newly diagnosed  - Patient initially started on prednisone 04/11/2013. Due to the increasing lethargy and confusion prednisone was put on hold. As part of chemo it was given again. Now 72 hours after, patient's mental status still good, alert and oriented to time, place and person  - Patient had bone marrow biopsy of left iliac crest done 04/15/2013 - cytology positive for lymphoma  - Pt received chemotherapy 04/18/2013 and has tolerated it well - port-a-cath placed with no complications.  - allopurinol discontinue today as uric acid low   Active Problems:  Hypercalcemia of malignancy  - Patient was given Pamidronate and Lasix 04/12/2013 as well as calcitonin with corrected calcium now being within normal limits at 9.84 (based on albumin 1.2 and calcium 7.5) - Calcitonin now discontinued  Hospital acquired pneumonia  - CXR showed bibasilar pneumonia  - blood cultures to date are negative  - has received vanco and zosyn and then levaquin (total of 7 days of antibiotics) Chronic cough - continue Hycodan as needed for cough Anemia of chronic disease  - patient received 1 unit PRBC 04/15/2013  and then 2 more units PRBC 04/20/2103  - Hemoglobin 8.8 this morning  Leukocytosis  - Secondary to Neupogen  - WBC  count trending down Thrombocytopenia  - platelets 112 and now after chemo 86 --> 71 - No signs of acute bleed and and a - Continue to monitor CBC  Acute metabolic encephalopathy  - Likely a combination of pneumonia, steroids and hypercalcemia.  - mental status at baseline  Thrush  - Continue fluconazole 200 mg IV daily and nystatin swish and swallow Hypokalemia / hypophosphatemia  - Repleted with Mag ox and K-Phos by mouth  - continue to replace electrolytes as needed Anorexia / severe protein calorie malnutrition  - continue nutritional supplements  - encourage PO intake  Grade I diastolic dysfunction  - 14 shows ejection fraction 55% with grade 1 diastolic dysfunction; check BNP - Given so far 2 doses of lasix 40 mg IV (1 today and 1 dose given 9/21); continue to monitor renal function - continue to monitor daily weight; strict intake and output - please note that pt is not on IV fluids Acute renal failure  - Secondary to dehydration and lasix - continue to monitor renal function  - in no significant improvement please consider renal consult in am Elevated LFTs / hepatic steatosis  - Hepatic steatosis noted on CT abdomen/pelvis done 03/30/13.    Code Status: DNR/DNI  Family Communication: [name] (husband) [phone] (cell), [phone] (home). Husband updated at bedside on daily basis.  Disposition Plan: Home when stable.   Medical Consultants:  Dr. Myra Rude, Oncology Other Consultants:  Dietitian Physical therapy Anti-infectives:  Cefepime and vanco started 04/15/2013 and d/c'ed 9/20  Levaquin 9/20 2014 --> 04/21/2013   Manson Passey, MD  [facility]  Pager [phone]   If 7PM-7AM, please contact night-coverage [email or web address] Password [facility] 04/21/2013, 9:04 AM  LOS: 11 days    HPI/Subjective: Feels better this am.  Objective: Filed Vitals:   04/20/13 0911 04/20/13 1330 04/20/13 2112 04/21/13 0553  BP:  126/78 122/67 117/74  Pulse:  100 103 103  Temp:  97.8  F (36.6 C) 97.7 F (36.5 C) 98.1 F (36.7 C)  TempSrc:  Oral Oral Oral  Resp:  20 20 20   Height:      Weight:      SpO2: 93% 94% 94% 96%    Intake/Output Summary (Last 24 hours) at 04/21/13 0904 Last data filed at 04/21/13 0051  Gross per 24 hour  Intake    590 ml  Output    350 ml  Net    240 ml    Exam:   General:  Pt is alert, follows commands appropriately, not in acute distress  Cardiovascular: Regular rate and rhythm, S1/S2 appreciated  Respiratory: Clear to auscultation bilaterally, no wheezing, no crackles, no rhonchi  Abdomen: Soft, non tender, non distended, bowel sounds present, no guarding  Extremities: +2-3 LE pitting edema, upper extremity edema, pulses DP and PT palpable bilaterally  Neuro: Grossly nonfocal  Data Reviewed: Basic Metabolic Panel:  Recent Labs Lab 04/17/13 0450 04/18/13 0510 04/19/13 0448 04/20/13 0410 04/21/13 0430  NA 140 139  140 133* 134* 135  K 3.5 3.4*  3.4* 3.4* 3.9 4.2  CL 105 104  104 100 102 103  CO2 22 24  24 21 20 20   GLUCOSE 218* 203*  207* 282* 302* 315*  BUN 18 22  22  30* 42* 52*  CREATININE 1.01 1.23*  1.25* 1.39* 1.69* 1.79*  CALCIUM 8.5 8.1*  8.3* 7.6* 7.7* 7.6*  MG 1.6 1.6 1.6 1.6 1.7  PHOS 2.4 2.5 2.4 2.8 3.4   Liver Function Tests:  Recent Labs Lab 04/18/13 0510 04/19/13 0448 04/20/13 0410 04/21/13 0430  AST 40* 42* 44* 28  ALT 8 8 10 11   ALKPHOS 104 102 124* 121*  BILITOT 1.2 1.0 1.1 1.0  PROT 5.3* 4.7* 4.6* 4.3*  ALBUMIN 1.3* 1.2* 1.2* 1.2*   No results found for this basename: LIPASE, AMYLASE,  in the last 168 hours No results found for this basename: AMMONIA,  in the last 168 hours CBC:  Recent Labs Lab 04/17/13 0450 04/18/13 0510 04/19/13 0448 04/20/13 0410 04/21/13 0430  WBC 8.7 9.5 7.7 28.6* 22.6*  HGB 8.2* 8.1* 7.2* 9.5* 8.8*  HCT 27.2* 26.8* 23.6* 29.5* 26.6*  MCV 79.1 79.5 78.7 79.7 80.1  PLT 112* 110* 86* 89* 71*   Cardiac Enzymes: No results found for this  basename: CKTOTAL, CKMB, CKMBINDEX, TROPONINI,  in the last 168 hours BNP: No components found with this basename: POCBNP,  CBG:  Recent Labs Lab 04/20/13 0805 04/20/13 1154 04/20/13 1644 04/20/13 2124 04/21/13 0837  GLUCAP 291* 358* 321* 242* 371*    CULTURE, BLOOD (ROUTINE X 2)     Status: None   Collection Time    04/15/13  3:43 PM      Result Value Range Status   Specimen Description BLOOD LEFT HAND   Final   Special Requests BOTTLES DRAWN AEROBIC ONLY 3CC   Final   Culture  Setup Time     Final   Value: 04/16/2013 03:42     Performed at [facility]   Culture     Final   Value:        BLOOD CULTURE RECEIVED NO GROWTH TO DATE     Performed at [facility]  Report Status PENDING   Incomplete  CULTURE, BLOOD (ROUTINE X 2)     Status: None   Collection Time    04/15/13  3:47 PM      Result Value Range Status   Specimen Description BLOOD RIGHT HAND   Final   Special Requests BOTTLES DRAWN AEROBIC ONLY 3CC   Final   Culture  Setup Time     Final   Value: 04/16/2013 03:42     Performed at [facility]   Culture     Final   Value:        BLOOD CULTURE RECEIVED NO GROWTH TO DATE     Performed at [facility]   Report Status PENDING   Incomplete     Studies: No results found.  Scheduled Meds: . allopurinol  300 mg Oral QHS  . aspirin EC  81 mg Oral Daily  . bisoprolol  10 mg Oral Daily  . fluconazole (DIFLUCAN)   200 mg Intravenous Q24H  . insulin aspart  0-15 Units Subcutaneous TID WC  . insulin aspart  5 Units Subcutaneous TID WC  . levofloxacin (LEVAQUIN)   750 mg Intravenous Q48H  . multivitamin   1 tablet Oral Daily  . nystatin  5 mL Oral QID  . pantoprazole  40 mg Oral Daily  . protein supplement  1 scoop Oral TID WC

## 2013-04-22 ENCOUNTER — Other Ambulatory Visit: Payer: Self-pay | Admitting: Certified Registered Nurse Anesthetist

## 2013-04-22 DIAGNOSIS — E119 Type 2 diabetes mellitus without complications: Secondary | ICD-10-CM | POA: Diagnosis present

## 2013-04-22 LAB — GLUCOSE, CAPILLARY
Glucose-Capillary: 256 mg/dL — ABNORMAL HIGH (ref 70–99)
Glucose-Capillary: 342 mg/dL — ABNORMAL HIGH (ref 70–99)
Glucose-Capillary: 343 mg/dL — ABNORMAL HIGH (ref 70–99)

## 2013-04-22 LAB — CBC
HCT: 24.6 % — ABNORMAL LOW (ref 36.0–46.0)
Hemoglobin: 7.9 g/dL — ABNORMAL LOW (ref 12.0–15.0)
MCH: 25.7 pg — ABNORMAL LOW (ref 26.0–34.0)
MCHC: 32.1 g/dL (ref 30.0–36.0)
Platelets: 56 10*3/uL — ABNORMAL LOW (ref 150–400)
WBC: 12.4 10*3/uL — ABNORMAL HIGH (ref 4.0–10.5)

## 2013-04-22 LAB — COMPREHENSIVE METABOLIC PANEL
ALT: 10 U/L (ref 0–35)
Albumin: 1.2 g/dL — ABNORMAL LOW (ref 3.5–5.2)
Alkaline Phosphatase: 157 U/L — ABNORMAL HIGH (ref 39–117)
BUN: 52 mg/dL — ABNORMAL HIGH (ref 6–23)
Calcium: 7.6 mg/dL — ABNORMAL LOW (ref 8.4–10.5)
Chloride: 105 mEq/L (ref 96–112)
GFR calc Af Amer: 36 mL/min — ABNORMAL LOW (ref >90)
Glucose, Bld: 303 mg/dL — ABNORMAL HIGH (ref 70–99)
Potassium: 4.1 mEq/L (ref 3.5–5.1)
Sodium: 137 mEq/L (ref 135–145)
Total Bilirubin: 1.1 mg/dL (ref 0.3–1.2)

## 2013-04-22 LAB — CULTURE, BLOOD (ROUTINE X 2): Culture: NO GROWTH

## 2013-04-22 LAB — URIC ACID: Uric Acid, Serum: 2.1 mg/dL — ABNORMAL LOW (ref 2.4–7.0)

## 2013-04-22 LAB — PRO B NATRIURETIC PEPTIDE: Pro B Natriuretic peptide (BNP): 16301 pg/mL — ABNORMAL HIGH (ref 0–125)

## 2013-04-22 LAB — MAGNESIUM: Magnesium: 1.6 mg/dL (ref 1.5–2.5)

## 2013-04-22 LAB — PHOSPHORUS: Phosphorus: 3.3 mg/dL (ref 2.3–4.6)

## 2013-04-22 MED ORDER — INSULIN GLARGINE 100 UNIT/ML ~~LOC~~ SOLN
10.0000 [IU] | Freq: Every day | SUBCUTANEOUS | Status: DC
  Administered 2013-04-22: 10 [IU] via SUBCUTANEOUS
  Filled 2013-04-22 (×3): qty 0.1

## 2013-04-22 NOTE — Progress Notes (Signed)
[facility] PROGRESS NOTE  Tonya Wise ZOX:[id number] DOB: 1941/06/16 DOA: 04/10/2013 PCP: [name], MD  Brief narrative: Tonya Wise is an 72 y.o. female with a PMH of recently diagnosed NHL who was admitted on 04/10/2013 with dehydration, acute renal failure, hypercalcemia and rigors. Her hospital course has been complicated by the development of lethargy and confusion. She underwent her first cycle of chemotherapy on 04/18/2013 after placement of a Port-A-Cath. She is slow to progress secondary to deconditioning, anorexia, and oral thrush with poor oral intake.  Assessment/Plan: Principal Problem:  Hypercalcemia related to malignancy, acute / newly diagnosed non-Hodgkin's lymphoma  -Admitted for IVF.  Given Pamidronate and Lasix 04/12/2013, with calcium now normalizing. She also received calcitonin. -Patient has been followed by oncology. She has been treated with prednisone which had to be discontinued secondary to increased confusion and lethargy. -Underwent bone marrow biopsy of left iliac crest 04/15/2013: Cytology positive for lymphoma. -Port-A-Cath placed 04/18/2013. -First cycle of R-CHOP done 04/18/2013. Active Problems: Diabetes -Continue Zebeta and SSI, insulin resistant scale with meal coverage. Add Lantus 10 units daily. -CBGs [id number]. Hospital-acquired pneumonia -Status post 7 days of treatment with vancomycin and cefepime followed by Levaquin. Lethargy/metabolic encephalopathy -Likely a combination of hypercalcemia and steroid effect in the setting of severe illness. -Mental status appears to be back to baseline. Thrush  -Continue fluconazole IV and nystatin swish and swallow. Hypokalemia / hypophosphatemia -Monitor and replace electrolytes as needed. Anorexia / severe protein calorie malnutrition -Seen by dietitian on 04/13/2013. Patient reports her oral intake is improving. Grade I diastolic dysfunction -Diuresis as tolerated. Pro BNP elevated and  has diffuse anasarca. Chronic cough  -2 view CXR initially negative for pneumonia.  -Anti-tussives as needed. Treated with 7 days of antibiotics for pneumonia that was diagnosed during her hospital stay. Microcytic anemia, chronic  -S/P normal EGD on 03/29/13. BX negative for celiac sprue.  -Likely anemia of chronic disease related to malignancy.  -Status post 1 unit of packed red blood cells 04/15/2013, and 2 additional units 04/19/2013. Acute renal failure  -Secondary to dehydration.  -Creatinine beginning to decline but still slightly higher than usual baseline. May have to tolerate higher creatinine to achieve diuresis. Elevated LFTs / hepatic steatosis  -Hepatic steatosis noted on CT abdomen/pelvis done 03/30/13.  Code Status: DNR  Family Communication: No family currently at bedside. Disposition Plan: Home when stable.  Medical Consultants:  Dr. Myra Rude, Oncology  Other Consultants:  Dietitian  Physical therapy  Anti-infectives:  Levaquin 04/18/2013---> 04/21/2013  Diflucan 04/17/2013---> 04/24/2013   Vancomycin 04/15/2013 ---> 04/18/2013   Cefepime 04/15/2013 ---> 04/18/2013    HPI/Subjective: Tonya Wise is sitting up in a chair. She continues to have a nonproductive cough. Denies pain. Says her appetite is beginning to pick up some. No dyspnea. Alert.   Objective: Filed Vitals:   04/21/13 1337 04/21/13 2205 04/22/13 0627 04/22/13 1209  BP: 130/91 118/68 124/79   Pulse: 94 100 109   Temp: 98.1 F (36.7 C) 98 F (36.7 C) 98.8 F (37.1 C)   TempSrc: Oral Oral Oral   Resp: 18 18 18    Height:      Weight:    83.825 kg (184 lb 12.8 oz)  SpO2: 97% 95% 95%     Intake/Output Summary (Last 24 hours) at 04/22/13 1307 Last data filed at 04/22/13 0919  Gross per 24 hour  Intake    520 ml  Output   1450 ml  Net   -930 ml  Exam: Gen:  NAD Cardiovascular:  Tachycardic Respiratory:  Lungs CTAB Gastrointestinal:  Abdomen soft, NT/ND, + BS Extremities:   4+  edema  Data Reviewed: Basic Metabolic Panel:  Recent Labs Lab 04/18/13 0510 04/19/13 0448 04/20/13 0410 04/21/13 0430 04/22/13 0530  NA 139  140 133* 134* 135 137  K 3.4*  3.4* 3.4* 3.9 4.2 4.1  CL 104  104 100 102 103 105  CO2 24  24 21 20 20 22   GLUCOSE 203*  207* 282* 302* 315* 303*  BUN 22  22 30* 42* 52* 52*  CREATININE 1.23*  1.25* 1.39* 1.69* 1.79* 1.60*  CALCIUM 8.1*  8.3* 7.6* 7.7* 7.6* 7.6*  MG 1.6 1.6 1.6 1.7 1.6  PHOS 2.5 2.4 2.8 3.4 3.3   GFR Estimated Creatinine Clearance: 32.4 ml/min (by C-G formula based on Cr of 1.6). Liver Function Tests:  Recent Labs Lab 04/18/13 0510 04/19/13 0448 04/20/13 0410 04/21/13 0430 04/22/13 0530  AST 40* 42* 44* 28 24  ALT 8 8 10 11 10   ALKPHOS 104 102 124* 121* 157*  BILITOT 1.2 1.0 1.1 1.0 1.1  PROT 5.3* 4.7* 4.6* 4.3* 4.1*  ALBUMIN 1.3* 1.2* 1.2* 1.2* 1.2*   CBC:  Recent Labs Lab 04/18/13 0510 04/19/13 0448 04/20/13 0410 04/21/13 0430 04/22/13 0530  WBC 9.5 7.7 28.6* 22.6* 12.4*  HGB 8.1* 7.2* 9.5* 8.8* 7.9*  HCT 26.8* 23.6* 29.5* 26.6* 24.6*  MCV 79.5 78.7 79.7 80.1 80.1  PLT 110* 86* 89* 71* 56*   CBG (last 3)   Recent Labs  04/21/13 2154 04/22/13 0757 04/22/13 1143  GLUCAP 256* 342* 343*      Microbiology Recent Results (from the past 240 hour(s))  CULTURE, BLOOD (ROUTINE X 2)     Status: None   Collection Time    04/15/13  3:43 PM      Result Value Range Status   Specimen Description BLOOD LEFT HAND   Final   Special Requests BOTTLES DRAWN AEROBIC ONLY 3CC   Final   Culture  Setup Time     Final   Value: 04/16/2013 03:42     Performed at [facility]   Culture     Final   Value: NO GROWTH 5 DAYS     Performed at [facility]   Report Status 04/22/2013 FINAL   Final  CULTURE, BLOOD (ROUTINE X 2)     Status: None   Collection Time    04/15/13  3:47 PM      Result Value Range Status   Specimen Description BLOOD RIGHT HAND   Final   Special Requests BOTTLES  DRAWN AEROBIC ONLY 3CC   Final   Culture  Setup Time     Final   Value: 04/16/2013 03:42     Performed at [facility]   Culture     Final   Value: NO GROWTH 5 DAYS     Performed at [facility]   Report Status 04/22/2013 FINAL   Final     Procedures and Diagnostic Studies:  X-ray Chest Pa And Lateral  04/10/2013   CLINICAL DATA:  72 year old female with cough and weakness. Lymphoma.  EXAM: CHEST  2 VIEW  COMPARISON:  Chest CT 03/30/2013 and earlier.  FINDINGS: Stable prominent right hilum. See recent CT report. Other mediastinal contours are within normal limits. Visualized tracheal air column is within normal limits. No pneumothorax, pulmonary edema, pleural effusion or confluent pulmonary opacity. No acute osseous abnormality identified.  IMPRESSION:  No new cardiopulmonary abnormality. Hilar lymphadenopathy, see 03/30/2013 chest CT.   Electronically Signed   By: Augusto Gamble M.D.   On: 04/10/2013 17:51    2-D echocardiogram 04/10/2013: Study conclusions: EF 50-55%. Doppler parameters consistent with abnormal left ventricular relaxation current disease grade 1 diastolic dysfunction)   Dg Chest 1 View 04/15/2013   CLINICAL DATA:  Fevers  EXAM: CHEST - 1 VIEW  COMPARISON:  04/10/2013  FINDINGS: Stable cardiac silhouette. There is increased bandlike densities in the right lower lobe. Linear thickening at the left lung base additionally. No pneumothorax. Lungs are hyperinflated.  IMPRESSION: Bibasilar linear bandlike opacities are concerning for pneumonia.   Electronically Signed   By: Genevive Bi M.D.   On: 04/15/2013 15:49    Ct Head Wo Contrast 04/14/2013   *RADIOLOGY REPORT*  Clinical Data: Lymphoma.  Lethargy  CT HEAD WITHOUT CONTRAST  Technique:  Contiguous axial images were obtained from the base of the skull through the vertex without contrast.  Comparison: MRI 11/26/2004.  CT 11/25/2004  Findings: Mild atrophy.  Negative for hydrocephalus.  Mild hypodensity in the  periventricular white matter similar to the prior study.  No acute infarct.  Negative for hemorrhage or mass.  IMPRESSION: Mild atrophy and mild chronic changes in the white matter.  No acute abnormality.   Original Report Authenticated By: Janeece Riggers, M.D.    Ir Fluoro Guide Cv Line Right 04/18/2013   CLINICAL DATA:  Cancer  EXAM: TUNNEL POWER PORT PLACEMENT WITH SUBCUTANEOUS POCKET UTILIZING ULTRASOUND & FLOUROSCOPY  MEDICATIONS AND MEDICAL HISTORY: Versed 2.0 mg, Fentanyl 100 mcg.  Vancomycin was given within two hours of incision. Vancomycin was given due to an antibiotic allergy.  ANESTHESIA/SEDATION: Moderate sedation time: 25 minutes  CONTRAST:  None  FLUOROSCOPY TIME:  1 min and 6 seconds.  PROCEDURE: After written informed consent was obtained, patient was placed in the supine position on angiographic table. The right neck and chest was prepped and draped in a sterile fashion. Lidocaine was utilized for local anesthesia. The right internal jugular vein was noted to be patent initially with ultrasound. Under sonographic guidance, a micropuncture needle was inserted into the right IJ vein (Ultrasound and fluoroscopic image documentation was performed). The needle was removed over an 018 wire which was exchanged for a Amplatz. This was advanced into the IVC. An 8-French dilator was advanced over the Amplatz.  A small incision was made in the right upper chest over the anterior right second rib. Utilizing blunt dissection, a subcutaneous pocket was created in the caudal direction. The pocket was irrigated with a copious amount of sterile normal saline. The port catheter was tunneled from the chest incision, and out the neck incision. The reservoir was inserted into the subcutaneous pocket and secured with two 3-0 Ethilon stitches. A peel-away sheath was advanced over the Amplatz wire. The port catheter was cut to measure length and inserted through the peel-away sheath. The peel-away sheath was removed. The  chest incision was closed with 3-0 Vicryl interrupted stitches for the subcutaneous tissue and a running of 4-0 Vicryl subcuticular stitch for the skin. The neck incision was closed with a 4-0 Vicryl subcuticular stitch. Derma-bond was applied to both surgical incisions. The port reservoir was flushed and instilled with heparinized saline. No complications.  FINDINGS: A right IJ vein Port-A-Cath is in place with its tip at the cavoatrial junction.  IMPRESSION: Successful 8 French right internal jugular vein power port placement with its tip at the SVC/RA junction.  Electronically Signed   By: Maryclare Bean M.D.   On: 04/18/2013 09:39    Ct Biopsy 04/15/2013   *RADIOLOGY REPORT*  Indication: New diagnosis of non-Hodgkin's lymphoma.  CT GUIDED LEFT ILIAC BONE MARROW ASPIRATION AND BONE MARROW CORE BIOPSIES  Intravenous medications: Fentanyl 50 mcg IV; Versed 0.5 mg IV  Sedation time: 10 minutes  Contrast volume: None  Complications: None immediate  PROCEDURE/FINDINGS:  Informed consent was obtained from the patient following an explanation of the procedure, risks, benefits and alternatives. The patient understands, agrees and consents for the procedure. All questions were addressed.  A time out was performed prior to the initiation of the procedure.  The patient was positioned prone and noncontrast localization CT was performed of the pelvis to demonstrate the iliac marrow spaces.  The operative site was prepped and draped in the usual sterile fashion.  Under sterile conditions and local anesthesia, an 11 gauge coaxial bone biopsy needle was advanced into the left iliac marrow space. Needle position was confirmed with CT imaging.  Initially, bone marrow aspiration was performed. Next, a bone marrow biopsy was obtained with the 11 gauge outer bone marrow device. The 11 gauge coaxial bone biopsy needle was readvanced into a slightly different location within the left iliac marrow space, positioning was confirmed and an  additional bone marrow biopsy was obtained. Samples were prepared with the cytotechnologist and deemed adequate.  The needle was removed intact.  Hemostasis was obtained with compression and a dressing was placed. The patient tolerated the procedure well without immediate post procedural complication.  IMPRESSION:  Successful CT guided left iliac bone marrow aspiration and core biopsies.   Original Report Authenticated By: Tacey Ruiz, MD    . antiseptic oral rinse  15 mL Mouth Rinse BID  . aspirin EC  81 mg Oral Daily  . bisoprolol  10 mg Oral Daily  . fluconazole (DIFLUCAN) IV  200 mg Intravenous Q24H  . insulin aspart  0-15 Units Subcutaneous TID WC  . insulin aspart  5 Units Subcutaneous TID WC  . insulin glargine  10 Units Subcutaneous Daily  . multivitamin with minerals  1 tablet Oral Daily  . nystatin  5 mL Oral QID  . pantoprazole  40 mg Oral Daily  . protein supplement  1 scoop Oral TID WC   Continuous Infusions:    Time spent:  25 minutes.    LOS: 12 days   Symphoni Helbling  [facility] Pager [phone].   *Please note that the hospitalists switch teams on Wednesdays. Please call the flow manager at [phone] if you are having difficulty reaching the hospitalist taking care of this patient as she can update you and provide the most up-to-date pager number of provider caring for the patient. If 8PM-8AM, please contact night-coverage at [email or web address], password [facility]  04/22/2013, 1:07 PM

## 2013-04-22 NOTE — Progress Notes (Signed)
NUTRITION FOLLOW UP  Intervention:   - Do not expect pt's PO intake and nutritional status to improve anytime soon, pt on day 12 of inadequate oral intake. Continue to recommend enteral nutrition. 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 Discussed recommendations in rounds and with hospitalist.  - Pt's weight up 21 pounds since admission. Management per MD.  - Continue Beneprotein powder supplementation - Will continue to monitor   Nutrition Dx:   Inadequate oral intake related to NH lymphoma as evidenced by reported weight loss and intake less than estimated needs - ongoing    Goal:   Pt to meet >/= 90% of their estimated nutrition needs - not met   Monitor:   Weights, labs, intake, goals of care, TF  Assessment:   Pt discussed during multidisciplinary rounds.   Met with pt and husband this morning. Pt able to eat 2/3 of chocolate milkshake yesterday. So far today pt has had a few bites of some peaches. Only nutritional supplement pt wants to try is Beneprotein powder (25 calories, 6g protein per packet), which she has been mixing with Gatorade. Does not want any Ensure Complete, Boost Plus, Resource Breeze, or Prostat. Pt on day 12 of inadequate oral intake with 0-25% intake at mealtimes.   Pt's weight up 21 pounds since admission. Noted pt with +1 RUE, LUE, + 3 RLE, LLE edema. Pt with elevated BUN trending up, Cr elevated but improved since yesterday, GFR low but gradually imporving. Pt with elevated Alk phos, BNP, and CBGs.      Height: Ht Readings from Last 1 Encounters:  04/10/13 5\' 2"  (1.575 m)    Weight Status:   Wt Readings from Last 1 Encounters:  04/17/13 176 lb 9.4 oz (80.1 kg)    Re-estimated needs:  Kcal: 1500-1750 Protein: 75-85 g  Fluid: 1.5-1.7 L   Skin: incision on  right groin 03/31/13, +3 RLE, LLE, + 1 RUE, LUE edema   Diet Order: General   Intake/Output Summary (Last 24 hours) at 04/22/13 1106 Last data filed at 04/22/13 0919  Gross per 24 hour  Intake    520 ml  Output   1450 ml  Net   -930 ml    Last BM: 9/22   Labs:   Recent Labs Lab 04/20/13 0410 04/21/13 0430 04/22/13 0530  NA 134* 135 137  K 3.9 4.2 4.1  CL 102 103 105  CO2 20 20 22   BUN 42* 52* 52*  CREATININE 1.69* 1.79* 1.60*  CALCIUM 7.7* 7.6* 7.6*  MG 1.6 1.7 1.6  PHOS 2.8 3.4 3.3  GLUCOSE 302* 315* 303*    CBG (last 3)   Recent Labs  04/21/13 0837 04/21/13 1208 04/21/13 1708  GLUCAP 371* 292* 287*    Scheduled Meds: . antiseptic oral rinse  15 mL Mouth Rinse BID  . aspirin EC  81 mg Oral Daily  . bisoprolol  10 mg Oral Daily  . fluconazole (DIFLUCAN) IV  200 mg Intravenous Q24H  . insulin aspart  0-15 Units Subcutaneous TID WC  . insulin aspart  5 Units Subcutaneous TID WC  . insulin glargine  10 Units Subcutaneous Daily  . multivitamin with minerals  1 tablet Oral Daily  . nystatin  5 mL Oral QID  . pantoprazole  40 mg Oral Daily  .  protein supplement  1 scoop Oral TID WC    Continuous Infusions:     Levon Hedger MS, RD, LDN [phone] Pager [phone] After Hours Pager

## 2013-04-22 NOTE — Progress Notes (Signed)
Physical Therapy Treatment Patient Details Name: Tonya Wise MRN: [id number] DOB: 1940/12/16 Today's Date: 04/22/2013 Time: 4098-1191 PT Time Calculation (min): 25 min  PT Assessment / Plan / Recommendation  History of Present Illness pt continues with edema in legs, but appears to be continuing with gradual improvements   PT Comments   Improvement in distance ambulated with less assistance needed.  Follow Up Recommendations  Home health PT     Does the patient have the potential to tolerate intense rehabilitation     Barriers to Discharge        Equipment Recommendations  3in1 (PT);Wheelchair (measurements PT);Hospital bed (husband will check about getting a wheelchair.  ? bed)    Recommendations for Other Services OT consult  Frequency Min 3X/week   Progress towards PT Goals Progress towards PT goals: Progressing toward goals  Plan Current plan remains appropriate    Precautions / Restrictions Precautions Precautions: Fall   Pertinent Vitals/Pain No c/o pain    Mobility  Bed Mobility Details for Bed Mobility Assistance: up in chair per nursing Transfers Transfers: Sit to Stand;Stand to Sit Sit to Stand: 4: Min assist Stand to Sit: 4: Min assist Details for Transfer Assistance: pt needs consistent cues to push up with arms Ambulation/Gait Ambulation/Gait Assistance: 3: Mod assist;4: Min assist Ambulation Distance (Feet): 75 Feet Assistive device: Rolling walker Ambulation/Gait Assistance Details: safety and occasional cues to step up into walker and improve posture.  Pt has one episode of posterior balance loss when stepping up onto scale. Pt unable to correct herself Gait Pattern: Step-through pattern;Decreased step length - right;Decreased step length - left Gait velocity: decr General Gait Details: noted gradual improvement.  Stairs: No Wheelchair Mobility Wheelchair Mobility: No    Exercises General Exercises - Lower Extremity Ankle Circles/Pumps:  AROM;Both;5 reps Quad Sets: AROM;Both;5 reps Hip ABduction/ADduction: AAROM;Both;5 reps;Supine Hip Flexion/Marching: AAROM;Both;5 reps;Supine   PT Diagnosis:    PT Problem List:   PT Treatment Interventions:     PT Goals (current goals can now be found in the care plan section)    Visit Information  Last PT Received On: 04/22/13 Assistance Needed: +2 (safety) History of Present Illness: pt continues with edema in legs, but appears to be continuing with gradual improvements    Subjective Data      Cognition  Cognition Arousal/Alertness: Awake/alert Behavior During Therapy: WFL for tasks assessed/performed Overall Cognitive Status: Within Functional Limits for tasks assessed    Balance  Balance Balance Assessed: Yes Static Sitting Balance Static Sitting - Balance Support: No upper extremity supported;Feet supported Static Sitting - Level of Assistance: 5: Stand by assistance Static Standing Balance Static Standing - Balance Support: Bilateral upper extremity supported;During functional activity Static Standing - Level of Assistance: 5: Stand by assistance Static Standing - Comment/# of Minutes: improving in stability in standing.  Pt stood for several minutes leaning on windowsill onto forearms enjoying looking out the window  End of Session PT - End of Session Equipment Utilized During Treatment: Gait belt Activity Tolerance: Patient tolerated treatment well Patient left: in chair Nurse Communication: Mobility status   GP    Rosey Bath K. [place], [place] [phone] 04/22/2013, 2:36 PM

## 2013-04-23 LAB — COMPREHENSIVE METABOLIC PANEL
ALT: 12 U/L (ref 0–35)
AST: 25 U/L (ref 0–37)
Albumin: 1.3 g/dL — ABNORMAL LOW (ref 3.5–5.2)
Alkaline Phosphatase: 121 U/L — ABNORMAL HIGH (ref 39–117)
Calcium: 8 mg/dL — ABNORMAL LOW (ref 8.4–10.5)
GFR calc Af Amer: 46 mL/min — ABNORMAL LOW (ref >90)
Glucose, Bld: 248 mg/dL — ABNORMAL HIGH (ref 70–99)
Potassium: 4 mEq/L (ref 3.5–5.1)
Sodium: 133 mEq/L — ABNORMAL LOW (ref 135–145)
Total Protein: 4.1 g/dL — ABNORMAL LOW (ref 6.0–8.3)

## 2013-04-23 LAB — GLUCOSE, CAPILLARY
Glucose-Capillary: 267 mg/dL — ABNORMAL HIGH (ref 70–99)
Glucose-Capillary: 291 mg/dL — ABNORMAL HIGH (ref 70–99)
Glucose-Capillary: 206 mg/dL — ABNORMAL HIGH (ref 70–99)
Glucose-Capillary: 178 mg/dL — ABNORMAL HIGH (ref 70–99)

## 2013-04-23 LAB — CBC
HCT: 25.5 % — ABNORMAL LOW (ref 36.0–46.0)
Hemoglobin: 8.2 g/dL — ABNORMAL LOW (ref 12.0–15.0)
MCHC: 32.2 g/dL (ref 30.0–36.0)
Platelets: 53 10*3/uL — ABNORMAL LOW (ref 150–400)
RDW: 18.2 % — ABNORMAL HIGH (ref 11.5–15.5)

## 2013-04-23 MED ORDER — INSULIN GLARGINE 100 UNIT/ML ~~LOC~~ SOLN
20.0000 [IU] | Freq: Every day | SUBCUTANEOUS | Status: DC
  Administered 2013-04-23 – 2013-04-24 (×2): 20 [IU] via SUBCUTANEOUS
  Filled 2013-04-23 (×2): qty 0.2

## 2013-04-23 MED ORDER — DEXTROMETHORPHAN POLISTIREX 30 MG/5ML PO LQCR
60.0000 mg | Freq: Two times a day (BID) | ORAL | Status: DC
  Administered 2013-04-23 – 2013-04-24 (×3): 60 mg via ORAL
  Filled 2013-04-23 (×4): qty 10

## 2013-04-23 NOTE — Progress Notes (Signed)
Physical Therapy Treatment Patient Details Name: Tonya Wise MRN: [id number] DOB: Jul 18, 1941 Today's Date: 04/23/2013 Time: 8657-8469 PT Time Calculation (min): 10 min  PT Assessment / Plan / Recommendation  History of Present Illness pt continues with edema in legs, but appears to be continuing with gradual improvements   PT Comments   Pt sleeping in the chair and does not feel that she can walk at this time.  She has been up in the chair most of the morning.  Pt assisted with transfer back to bed so she could rest better  Follow Up Recommendations  Home health PT     Does the patient have the potential to tolerate intense rehabilitation     Barriers to Discharge        Equipment Recommendations  3in1 (PT);Wheelchair (measurements PT);Hospital bed    Recommendations for Other Services    Frequency     Progress towards PT Goals Progress towards PT goals: Not progressing toward goals - comment (pt with fatigue today)  Plan Current plan remains appropriate    Precautions / Restrictions Precautions Precautions: Fall   Pertinent Vitals/Pain No c/o pain    Mobility  Bed Mobility Bed Mobility: Sit to Supine Sit to Supine: 1: +2 Total assist Sit to Supine: Patient Percentage: 50% Details for Bed Mobility Assistance: pt with some difficulty lifting legs up onto bed Transfers Transfers: Sit to Stand;Stand to Sit Sit to Stand: 4: Min assist Stand to Sit: 4: Min assist Details for Transfer Assistance: pt needs consistent cues to push up with arms Ambulation/Gait Ambulation/Gait Assistance: 4: Min assist Ambulation Distance (Feet): 2 Feet Assistive device: Rolling walker Ambulation/Gait Assistance Details: safety.  Gait Pattern: Step-through pattern;Decreased step length - right;Decreased step length - left Gait velocity: decr General Gait Details: pt not feeling as well today. she had been sleeping in the chair and did not feel like she could walk at this time.  Pt assisted  with return to bed so she could sleep Stairs: No Wheelchair Mobility Wheelchair Mobility: No    Exercises     PT Diagnosis:    PT Problem List:   PT Treatment Interventions:     PT Goals (current goals can now be found in the care plan section)    Visit Information  Last PT Received On: 04/23/13 Assistance Needed: +2 History of Present Illness: pt continues with edema in legs, but appears to be continuing with gradual improvements    Subjective Data      Cognition  Cognition Arousal/Alertness: Awake/alert Behavior During Therapy: WFL for tasks assessed/performed Overall Cognitive Status: Within Functional Limits for tasks assessed    Balance     End of Session PT - End of Session Activity Tolerance: Patient limited by fatigue Patient left: in bed Nurse Communication: Mobility status   GP    Rosey Bath K. [place], [place] [phone] 04/23/2013, 2:10 PM

## 2013-04-23 NOTE — Progress Notes (Signed)
[facility] PROGRESS NOTE  Tonya Wise ZOX:[id number] DOB: 26-Oct-1940 DOA: 04/10/2013 PCP: [name], MD  Brief narrative: Tonya Wise is an 72 y.o. female with a PMH of recently diagnosed NHL who was admitted on 04/10/2013 with dehydration, acute renal failure, hypercalcemia and rigors. Her hospital course has been complicated by the development of lethargy and confusion. She underwent her first cycle of chemotherapy on 04/18/2013 after placement of a Port-A-Cath. She is slow to progress secondary to deconditioning, anorexia, and oral thrush with poor oral intake.  Assessment/Plan: Principal Problem:  Hypercalcemia related to malignancy, acute / newly diagnosed non-Hodgkin's lymphoma  -Admitted for IVF.  Given Pamidronate and Lasix 04/12/2013, with calcium now normalizing. She also received calcitonin. -Patient has been followed by oncology. She has been treated with prednisone which had to be discontinued secondary to increased confusion and lethargy. -Underwent bone marrow biopsy of left iliac crest 04/15/2013: Cytology positive for lymphoma. -Port-A-Cath placed 04/18/2013. -First cycle of R-CHOP done 04/18/2013. Active Problems: Diabetes -Continue Zebeta and SSI, insulin resistant scale with meal coverage. Increase Lantus to 20 units daily. -CBGs [id number]. Hospital-acquired pneumonia -Status post 7 days of treatment with vancomycin and cefepime followed by Levaquin. Lethargy/metabolic encephalopathy -Resolved. Likely a combination of hypercalcemia and steroid effect in the setting of severe illness. Thrush  -Continue fluconazole IV and nystatin swish and swallow. Hypokalemia / hypophosphatemia -Monitor and replace electrolytes as needed. Currently within normal limits. Anorexia / severe protein calorie malnutrition -Seen by dietitian on 04/13/2013. Patient reports her oral intake is improving. Grade I diastolic dysfunction -Diuresis as tolerated. Pro BNP elevated and  has diffuse anasarca. Chronic cough  -2 view CXR initially negative for pneumonia. Treated with 7 days of antibiotics for pneumonia that was subsequently diagnosed during her hospital stay. -Will place on Delsym for cough control. Microcytic anemia, chronic  -S/P normal EGD on 03/29/13. BX negative for celiac sprue.  -Likely anemia of chronic disease related to malignancy.  -Status post 1 unit of packed red blood cells 04/15/2013, and 2 additional units 04/19/2013. Acute renal failure  -Secondary to dehydration.  -Creatinine improved today.  Elevated LFTs / hepatic steatosis  -Hepatic steatosis noted on CT abdomen/pelvis done 03/30/13.  Code Status: DNR  Family Communication:  Husband updated at bedside. Disposition Plan: Home when stable, likely next 24-48 hours.  Medical Consultants:  Dr. Myra Rude, Oncology  Other Consultants:  Dietitian  Physical therapy  Anti-infectives:  Levaquin 04/18/2013---> 04/21/2013  Diflucan 04/17/2013---> 04/24/2013   Vancomycin 04/15/2013 ---> 04/18/2013   Cefepime 04/15/2013 ---> 04/18/2013    HPI/Subjective: Tonya Wise is sitting up in a chair. Today [birth year] her birthday. She continues to have a nonproductive cough.  Has some burning pain in her mouth but otherwise is pain-free. No nausea, vomiting, or dyspnea.   Objective: Filed Vitals:   04/22/13 1353 04/22/13 2100 04/22/13 2120 04/23/13 0506  BP: 125/67  129/70 132/71  Pulse: 94 94 99 110  Temp: 97.8 F (36.6 C)  98.3 F (36.8 C) 98.2 F (36.8 C)  TempSrc: Oral  Oral Oral  Resp: 18  18 16   Height:      Weight:    83.643 kg (184 lb 6.4 oz)  SpO2: 96%  96% 96%    Intake/Output Summary (Last 24 hours) at 04/23/13 0754 Last data filed at 04/23/13 0519  Gross per 24 hour  Intake    580 ml  Output   1150 ml  Net   -570 ml    Exam: Gen:  NAD Cardiovascular:  Tachycardic Respiratory:  Lungs CTAB Gastrointestinal:  Abdomen soft, NT/ND, + BS Extremities:   4+ edema  Data  Reviewed: Basic Metabolic Panel:  Recent Labs Lab 04/18/13 0510 04/19/13 0448 04/20/13 0410 04/21/13 0430 04/22/13 0530 04/23/13 0500  NA 139  140 133* 134* 135 137 133*  K 3.4*  3.4* 3.4* 3.9 4.2 4.1 4.0  CL 104  104 100 102 103 105 103  CO2 24  24 21 20 20 22 22   GLUCOSE 203*  207* 282* 302* 315* 303* 248*  BUN 22  22 30* 42* 52* 52* 42*  CREATININE 1.23*  1.25* 1.39* 1.69* 1.79* 1.60* 1.30*  CALCIUM 8.1*  8.3* 7.6* 7.7* 7.6* 7.6* 8.0*  MG 1.6 1.6 1.6 1.7 1.6  --   PHOS 2.5 2.4 2.8 3.4 3.3  --    GFR Estimated Creatinine Clearance: 39.2 ml/min (by C-G formula based on Cr of 1.3). Liver Function Tests:  Recent Labs Lab 04/19/13 0448 04/20/13 0410 04/21/13 0430 04/22/13 0530 04/23/13 0500  AST 42* 44* 28 24 25   ALT 8 10 11 10 12   ALKPHOS 102 124* 121* 157* 121*  BILITOT 1.0 1.1 1.0 1.1 1.2  PROT 4.7* 4.6* 4.3* 4.1* 4.1*  ALBUMIN 1.2* 1.2* 1.2* 1.2* 1.3*   CBC:  Recent Labs Lab 04/19/13 0448 04/20/13 0410 04/21/13 0430 04/22/13 0530 04/23/13 0500  WBC 7.7 28.6* 22.6* 12.4* 6.5  HGB 7.2* 9.5* 8.8* 7.9* 8.2*  HCT 23.6* 29.5* 26.6* 24.6* 25.5*  MCV 78.7 79.7 80.1 80.1 81.0  PLT 86* 89* 71* 56* 53*   CBG (last 3)   Recent Labs  04/22/13 1717 04/22/13 2118 04/23/13 0723  GLUCAP 278* 167* 267*      Microbiology Recent Results (from the past 240 hour(s))  CULTURE, BLOOD (ROUTINE X 2)     Status: None   Collection Time    04/15/13  3:43 PM      Result Value Range Status   Specimen Description BLOOD LEFT HAND   Final   Special Requests BOTTLES DRAWN AEROBIC ONLY 3CC   Final   Culture  Setup Time     Final   Value: 04/16/2013 03:42     Performed at [facility]   Culture     Final   Value: NO GROWTH 5 DAYS     Performed at [facility]   Report Status 04/22/2013 FINAL   Final  CULTURE, BLOOD (ROUTINE X 2)     Status: None   Collection Time    04/15/13  3:47 PM      Result Value Range Status   Specimen Description BLOOD  RIGHT HAND   Final   Special Requests BOTTLES DRAWN AEROBIC ONLY 3CC   Final   Culture  Setup Time     Final   Value: 04/16/2013 03:42     Performed at [facility]   Culture     Final   Value: NO GROWTH 5 DAYS     Performed at [facility]   Report Status 04/22/2013 FINAL   Final     Procedures and Diagnostic Studies:  X-ray Chest Pa And Lateral  04/10/2013   CLINICAL DATA:  72 year old female with cough and weakness. Lymphoma.  EXAM: CHEST  2 VIEW  COMPARISON:  Chest CT 03/30/2013 and earlier.  FINDINGS: Stable prominent right hilum. See recent CT report. Other mediastinal contours are within normal limits. Visualized tracheal air column is within normal limits. No pneumothorax, pulmonary edema,  pleural effusion or confluent pulmonary opacity. No acute osseous abnormality identified.  IMPRESSION: No new cardiopulmonary abnormality. Hilar lymphadenopathy, see 03/30/2013 chest CT.   Electronically Signed   By: Augusto Gamble M.D.   On: 04/10/2013 17:51    2-D echocardiogram 04/10/2013: Study conclusions: EF 50-55%. Doppler parameters consistent with abnormal left ventricular relaxation current disease grade 1 diastolic dysfunction)   Dg Chest 1 View 04/15/2013   CLINICAL DATA:  Fevers  EXAM: CHEST - 1 VIEW  COMPARISON:  04/10/2013  FINDINGS: Stable cardiac silhouette. There is increased bandlike densities in the right lower lobe. Linear thickening at the left lung base additionally. No pneumothorax. Lungs are hyperinflated.  IMPRESSION: Bibasilar linear bandlike opacities are concerning for pneumonia.   Electronically Signed   By: Genevive Bi M.D.   On: 04/15/2013 15:49    Ct Head Wo Contrast 04/14/2013   *RADIOLOGY REPORT*  Clinical Data: Lymphoma.  Lethargy  CT HEAD WITHOUT CONTRAST  Technique:  Contiguous axial images were obtained from the base of the skull through the vertex without contrast.  Comparison: MRI 11/26/2004.  CT 11/25/2004  Findings: Mild atrophy.  Negative for  hydrocephalus.  Mild hypodensity in the periventricular white matter similar to the prior study.  No acute infarct.  Negative for hemorrhage or mass.  IMPRESSION: Mild atrophy and mild chronic changes in the white matter.  No acute abnormality.   Original Report Authenticated By: Janeece Riggers, M.D.    Ir Fluoro Guide Cv Line Right 04/18/2013   CLINICAL DATA:  Cancer  EXAM: TUNNEL POWER PORT PLACEMENT WITH SUBCUTANEOUS POCKET UTILIZING ULTRASOUND & FLOUROSCOPY  MEDICATIONS AND MEDICAL HISTORY: Versed 2.0 mg, Fentanyl 100 mcg.  Vancomycin was given within two hours of incision. Vancomycin was given due to an antibiotic allergy.  ANESTHESIA/SEDATION: Moderate sedation time: 25 minutes  CONTRAST:  None  FLUOROSCOPY TIME:  1 min and 6 seconds.  PROCEDURE: After written informed consent was obtained, patient was placed in the supine position on angiographic table. The right neck and chest was prepped and draped in a sterile fashion. Lidocaine was utilized for local anesthesia. The right internal jugular vein was noted to be patent initially with ultrasound. Under sonographic guidance, a micropuncture needle was inserted into the right IJ vein (Ultrasound and fluoroscopic image documentation was performed). The needle was removed over an 018 wire which was exchanged for a Amplatz. This was advanced into the IVC. An 8-French dilator was advanced over the Amplatz.  A small incision was made in the right upper chest over the anterior right second rib. Utilizing blunt dissection, a subcutaneous pocket was created in the caudal direction. The pocket was irrigated with a copious amount of sterile normal saline. The port catheter was tunneled from the chest incision, and out the neck incision. The reservoir was inserted into the subcutaneous pocket and secured with two 3-0 Ethilon stitches. A peel-away sheath was advanced over the Amplatz wire. The port catheter was cut to measure length and inserted through the peel-away  sheath. The peel-away sheath was removed. The chest incision was closed with 3-0 Vicryl interrupted stitches for the subcutaneous tissue and a running of 4-0 Vicryl subcuticular stitch for the skin. The neck incision was closed with a 4-0 Vicryl subcuticular stitch. Derma-bond was applied to both surgical incisions. The port reservoir was flushed and instilled with heparinized saline. No complications.  FINDINGS: A right IJ vein Port-A-Cath is in place with its tip at the cavoatrial junction.  IMPRESSION: Successful 8 French right internal jugular  vein power port placement with its tip at the SVC/RA junction.   Electronically Signed   By: Maryclare Bean M.D.   On: 04/18/2013 09:39    Ct Biopsy 04/15/2013   *RADIOLOGY REPORT*  Indication: New diagnosis of non-Hodgkin's lymphoma.  CT GUIDED LEFT ILIAC BONE MARROW ASPIRATION AND BONE MARROW CORE BIOPSIES  Intravenous medications: Fentanyl 50 mcg IV; Versed 0.5 mg IV  Sedation time: 10 minutes  Contrast volume: None  Complications: None immediate  PROCEDURE/FINDINGS:  Informed consent was obtained from the patient following an explanation of the procedure, risks, benefits and alternatives. The patient understands, agrees and consents for the procedure. All questions were addressed.  A time out was performed prior to the initiation of the procedure.  The patient was positioned prone and noncontrast localization CT was performed of the pelvis to demonstrate the iliac marrow spaces.  The operative site was prepped and draped in the usual sterile fashion.  Under sterile conditions and local anesthesia, an 11 gauge coaxial bone biopsy needle was advanced into the left iliac marrow space. Needle position was confirmed with CT imaging.  Initially, bone marrow aspiration was performed. Next, a bone marrow biopsy was obtained with the 11 gauge outer bone marrow device. The 11 gauge coaxial bone biopsy needle was readvanced into a slightly different location within the left iliac  marrow space, positioning was confirmed and an additional bone marrow biopsy was obtained. Samples were prepared with the cytotechnologist and deemed adequate.  The needle was removed intact.  Hemostasis was obtained with compression and a dressing was placed. The patient tolerated the procedure well without immediate post procedural complication.  IMPRESSION:  Successful CT guided left iliac bone marrow aspiration and core biopsies.   Original Report Authenticated By: Tacey Ruiz, MD    . antiseptic oral rinse  15 mL Mouth Rinse BID  . aspirin EC  81 mg Oral Daily  . bisoprolol  10 mg Oral Daily  . fluconazole (DIFLUCAN) IV  200 mg Intravenous Q24H  . insulin aspart  0-15 Units Subcutaneous TID WC  . insulin aspart  5 Units Subcutaneous TID WC  . insulin glargine  10 Units Subcutaneous Daily  . multivitamin with minerals  1 tablet Oral Daily  . nystatin  5 mL Oral QID  . pantoprazole  40 mg Oral Daily  . protein supplement  1 scoop Oral TID WC   Continuous Infusions:    Time spent:  25 minutes.    LOS: 13 days   RAMA,CHRISTINA  [facility] Pager [phone].   *Please note that the hospitalists switch teams on Wednesdays. Please call the flow manager at [phone] if you are having difficulty reaching the hospitalist taking care of this patient as she can update you and provide the most up-to-date pager number of provider caring for the patient. If 8PM-8AM, please contact night-coverage at [email or web address], password [facility]  04/23/2013, 7:54 AM

## 2013-04-23 NOTE — Care Management Note (Signed)
Cm spoke with patient and spouse at bedside. Per pt [facility] to provide [facility] services at discharge. Awaiting MD orders for HHPT,wheelchair, and BSC. Pt has RW. Spouse to provide transportation home. PCP: [name], MD. Oncologist: Dr.Chism. No other barriers or needs identified.    Roxy Manns Dessie Tatem,RN,MSN [phone]

## 2013-04-23 NOTE — Progress Notes (Addendum)
CSW reviewed chart.  CSW noted that [facility] assisting with home health needs and that pt spouse plans to provide transportation for pt to home at discharge.  No further social work needs identified at this time.  CSW signing off.   Please re-consult if further social work needs arise.  Jacklynn Lewis, MSW, LCSWA  Clinical Social Work [phone]

## 2013-04-24 DIAGNOSIS — K209 Esophagitis, unspecified without bleeding: Secondary | ICD-10-CM | POA: Diagnosis present

## 2013-04-24 MED ORDER — DEXTROMETHORPHAN POLISTIREX 30 MG/5ML PO LQCR: 60.0000 mg | Freq: Two times a day (BID) | ORAL | Status: DC

## 2013-04-24 MED ORDER — INSULIN DETEMIR 100 UNIT/ML FLEXPEN: 20.0000 [IU] | PEN_INJECTOR | Freq: Every day | SUBCUTANEOUS | Status: DC

## 2013-04-24 MED ORDER — RESOURCE INSTANT PROTEIN PO PWD PACKET: 1.0000 | Freq: Three times a day (TID) | ORAL | Status: DC

## 2013-04-24 MED ORDER — OXYCODONE HCL 5 MG PO TABS: 5.0000 mg | ORAL_TABLET | Freq: Four times a day (QID) | ORAL | Status: DC | PRN

## 2013-04-24 MED ORDER — HEPARIN SOD (PORK) LOCK FLUSH 100 UNIT/ML IV SOLN
500.0000 [IU] | INTRAVENOUS | Status: AC | PRN
  Administered 2013-04-24: 500 [IU]
  Filled 2013-04-24: qty 5

## 2013-04-24 MED ORDER — INSULIN GLARGINE 100 UNITS/ML SOLOSTAR PEN: 20.0000 [IU] | PEN_INJECTOR | Freq: Every day | SUBCUTANEOUS | Status: DC

## 2013-04-24 MED ORDER — VALACYCLOVIR HCL 1 G PO TABS: 1000.0000 mg | ORAL_TABLET | Freq: Three times a day (TID) | ORAL | Status: DC

## 2013-04-24 MED ORDER — ALLOPURINOL 300 MG PO TABS: 300.0000 mg | ORAL_TABLET | Freq: Every day | ORAL | Status: DC

## 2013-04-24 MED ORDER — VALACYCLOVIR HCL 500 MG PO TABS
1000.0000 mg | ORAL_TABLET | Freq: Three times a day (TID) | ORAL | Status: DC
  Administered 2013-04-24: 1000 mg via ORAL
  Filled 2013-04-24 (×2): qty 2

## 2013-04-24 MED ORDER — ONDANSETRON HCL 4 MG PO TABS: 4.0000 mg | ORAL_TABLET | Freq: Four times a day (QID) | ORAL | Status: DC | PRN

## 2013-04-24 MED ORDER — LIVING WELL WITH DIABETES BOOK
Freq: Once | Status: DC
  Filled 2013-04-24: qty 1

## 2013-04-24 MED ORDER — INSULIN PEN STARTER KIT
1.0000 | Freq: Once | Status: DC
  Filled 2013-04-24: qty 1

## 2013-04-24 NOTE — Progress Notes (Signed)
NUTRITION FOLLOW UP  Intervention:   - Recommend appetite stimulant  - Continued to encourage increased PO intake and assisted pt with ordering snacks - Provided ongoing education of importance of nutrition and discussed high protein/calorie meals/snacks with handout of this information - Pt's weight up 29 pounds since admission. Management per MD.  - Continue Beneprotein powder supplementation - Will continue to monitor   Nutrition Dx:   Inadequate oral intake related to NH lymphoma as evidenced by reported weight loss and intake less than estimated needs - ongoing    Goal:   Pt to meet >/= 90% of their estimated nutrition needs - not met   Monitor:   Weights, labs, intake  Assessment:   Pt discussed during multidisciplinary rounds.   Met with pt and husband this morning. Pt didn't really eat anything yesterday, had some of her birthday cake but did have 2 gatorades each mixed with 1 packet of Beneprotein powder. Pt reports pt's edema improving however per daily weights, pt's weight up 29 pounds since admission.   Per discussion with RN, oncologist plans to have further discussions about palliative care with pt after discharge. Noted plans for possible d/c today.    Pt with slightly low sodium, elevated uric acid trending up, and elevated BUN/Cr trending down, Cr elevated but trending down, Alk phos elevated but trending down.   Height: Ht Readings from Last 1 Encounters:  04/10/13 5\' 2"  (1.575 m)    Weight Status:   Wt Readings from Last 1 Encounters:  04/23/13 184 lb 6.4 oz (83.643 kg)    Re-estimated needs:  Kcal: 1500-1750 Protein: 75-85 g  Fluid: 1.5-1.7 L   Skin: incision on right groin 03/31/13, +2 RUE, LUE edema, +3 RLE, LLE edema   Diet Order: General   Intake/Output Summary (Last 24 hours) at 04/24/13 1119 Last data filed at 04/24/13 4696  Gross per 24 hour  Intake    480 ml  Output   1250 ml  Net   -770 ml    Last BM: 9/25   Labs:   Recent  Labs Lab 04/20/13 0410 04/21/13 0430 04/22/13 0530 04/23/13 0500  NA 134* 135 137 133*  K 3.9 4.2 4.1 4.0  CL 102 103 105 103  CO2 20 20 22 22   BUN 42* 52* 52* 42*  CREATININE 1.69* 1.79* 1.60* 1.30*  CALCIUM 7.7* 7.6* 7.6* 8.0*  MG 1.6 1.7 1.6  --   PHOS 2.8 3.4 3.3  --   GLUCOSE 302* 315* 303* 248*    CBG (last 3)   Recent Labs  04/23/13 1634 04/23/13 2119 04/24/13 0818  GLUCAP 206* 178* 214*    Scheduled Meds: . antiseptic oral rinse  15 mL Mouth Rinse BID  . aspirin EC  81 mg Oral Daily  . bisoprolol  10 mg Oral Daily  . dextromethorphan  60 mg Oral BID  . insulin aspart  0-15 Units Subcutaneous TID WC  . insulin aspart  5 Units Subcutaneous TID WC  . insulin glargine  20 Units Subcutaneous Daily  . multivitamin with minerals  1 tablet Oral Daily  . nystatin  5 mL Oral QID  . pantoprazole  40 mg Oral Daily  . protein supplement  1 scoop Oral TID WC      Levon Hedger MS, RD, LDN [phone] Pager [phone] After Hours Pager

## 2013-04-24 NOTE — Discharge Summary (Signed)
Physician Discharge Summary  Tonya Wise:[id number] DOB: 11-14-1940 DOA: 04/10/2013  PCP: [name], MD  Admit date: 04/10/2013 Discharge date: 04/24/2013  Recommendations for Outpatient Follow-up:  1. Note: The patient is being discharged home on Lantus insulin therapy. She's been encouraged to keep a log of her blood glucoses and to bring this with her to her next PCP visit to review her glycemic control and make adjustments if needed.  2. Patient has been discharged on Valtrex for suspected herpes esophagitis. Please check her mouth and followup for resolution of symptoms and reinforce teaching to take the Valtrex from a treatment dose to a suppression dose once her current infection is suppressed.   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  Diet recommendation: Carbohydrate modified   History of present illness:  Tonya Wise is an 72 y.o. female with a PMH of recently diagnosed NHL who was admitted on 04/10/2013 with dehydration, acute renal failure, hypercalcemia and rigors. Her hospital course has been complicated by the development of lethargy and confusion. She underwent her first cycle of chemotherapy on 04/18/2013 after placement of a Port-A-Cath. She is slow to progress secondary to deconditioning, anorexia, and oral thrush/HSV with poor oral intake.  Hospital Course by problem:  Principal Problem:  Hypercalcemia related to malignancy, acute /  newly diagnosed non-Hodgkin's lymphoma  -Admitted for IVF. Given Pamidronate and Lasix 04/12/2013, with calcium now normalized. She also received calcitonin.  -Patient has been followed by oncology. She has been treated with prednisone which had to be discontinued secondary to increased confusion and lethargy.  -Underwent bone marrow biopsy of left iliac crest 04/15/2013: Cytology positive for lymphoma.  -Port-A-Cath placed 04/18/2013.  -First cycle of R-CHOP done 04/18/2013. Followup with oncology post discharge. Active Problems:  Diabetes  -Managed with Zebeta and SSI, insulin resistant scale with meal coverage while in the hospital. -Will order Lantus 20 units daily at discharge with recommendations for close followup of glycemic control. Hospital-acquired pneumonia  -Status post 7 days of treatment with vancomycin and cefepime followed by Levaquin.  Lethargy/metabolic encephalopathy  -Resolved. Likely a combination of hypercalcemia and steroid effect in the setting of severe illness.  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  Discharge home on treatment dose Valtrex with instructions to reduce dose to suppression therapy once treated.  Hypokalemia / hypophosphatemia  -Monitored and replaced as needed.  Anorexia / severe protein calorie malnutrition  -Seen by dietitian on 04/13/2013. Patient reports her oral intake is improving.  Grade I diastolic dysfunction  -Diuresis as tolerated. Pro BNP elevated and has diffuse anasarca.  Chronic cough  -2 view CXR initially negative for pneumonia. Treated with 7 days of antibiotics for pneumonia that was subsequently diagnosed during her hospital stay.  -Cough improved on therapy with  Delsym.  Microcytic anemia, chronic  -S/P normal EGD on 03/29/13. BX negative for celiac sprue.  -Likely anemia of chronic  disease related to malignancy.  -Status post 1 unit of packed red blood cells 04/15/2013, and 2 additional units 04/19/2013.  Acute renal failure  -Secondary to dehydration.  -Creatinine improved at discharge.  Elevated LFTs / hepatic steatosis  -Hepatic steatosis noted on CT abdomen/pelvis done 03/30/13.  Medical Consultants:  Dr. Myra Rude, Oncology   Discharge Exam: Filed Vitals:   04/24/13 0530  BP: 139/77  Pulse: 113  Temp: 99.5 F (37.5 C)  Resp: 18   Filed Vitals:   04/23/13 2034 04/23/13 2140 04/23/13 2148 04/24/13 0530  BP: 145/81  126/80 139/77  Pulse: 103 98 79 113  Temp: 98.1 F (36.7 C)  97.8 F (36.6 C) 99.5 F (37.5 C)  TempSrc: Oral  Oral Oral  Resp: 18  17 18   Height:      Weight:      SpO2: 97%  98% 95%    Gen:  NAD Cardiovascular:  RRR, No M/R/G Respiratory: Lungs CTAB Gastrointestinal: Abdomen soft, NT/ND with normal active bowel sounds. Extremities: No C/E/C   Discharge Instructions  Discharge Orders   Future Orders Complete By Expires   Call MD for:  extreme fatigue  As directed    Call MD for:  persistant dizziness or light-headedness  As directed    Call MD for:  persistant nausea and vomiting  As directed    Call MD for:  severe uncontrolled pain  As directed    Call MD for:  temperature >100.4  As directed    Care order/instruction  As directed    Scheduling Instructions:     Need 2 units of blood today ASAP   Comments:     Transfuse Parameters   Diet Carb Modified  As directed    Discharge instructions  As directed    Comments:     -Take your pain medicine 30 minutes before attempting to eating meal. -Take the Valtrex for one full week. Talk with your physician if there's no improvement in your mouth soreness and pain with swallowing after you have been on the medicine for one week. If you're mouth and throat pain improves on the Valtrex, you can continue to take 1000 mg daily for suppression so that this type of infection does not  return. I have written a refill for this purpose. -You will be discharged on insulin therapy which will be given in a prefilled syringe where you dial in the number of units needed. I have asked for a home health nurse to come out to your house to assure that you understand how to administer this medication safely and effectively. -It is important that you check your blood sugars regularly and keep a log of them to bring with you to your next PCP visit so and adjustments can be made to your dose if needed.  You were cared for by Dr. Hillery Aldo  (a hospitalist) during your hospital stay. If you have any questions about your discharge medications or the care you received while you were in the hospital after you are discharged, you can call the unit and ask to speak with the hospitalist on call if the hospitalist that took care of you is not available. Once you are discharged, your primary care physician will handle any further medical issues. Please note that NO REFILLS for any discharge medications will be authorized once you are discharged, as  it is imperative that you return to your primary care physician (or establish a relationship with a primary care physician if you do not have one) for your aftercare needs so that they can reassess your need for medications and monitor your lab values.  Any outstanding tests can be reviewed by your PCP at your follow up visit.  It is also important to review any medicine changes with your PCP.  Please bring these d/c instructions with you to your next visit so your physician can review these changes with you.  If you do not have a primary care physician, you can call [phone] for a physician referral.  It is highly recommended that you obtain a PCP for hospital follow up.   DME Bedside commode  As directed    Face-to-face encounter (required for Medicare/Medicaid patients)  As directed    Comments:     I Eurydice Calixto certify that this patient is under my care  and that I, or a nurse practitioner or physician's assistant working with me, had a face-to-face encounter that meets the physician face-to-face encounter requirements with this patient on 04/24/2013. The encounter with the patient was in whole, or in part for the following medical condition(s) which is the primary reason for home health care (List medical condition): Patient has insulin requiring diabetes and a change in her therapy to insulin, if need home health R.N. to assure proper administration and safety with this new treatment. Needs home PT for strength exercises given significant deconditioning from lymphoma and its treatment.   Questions:     The encounter with the patient was in whole, or in part, for the following medical condition, which is the primary reason for home health care:  Insulin requiring diabetes (new), deconditioning secondary to non-Hodgkin's lymphoma   I certify that, based on my findings, the following services are medically necessary home health services:  Nursing   Physical therapy   My clinical findings support the need for the above services:  Unable to leave home safely without assistance and/or assistive device   Further, I certify that my clinical findings support that this patient is homebound due to:  Unable to leave home safely without assistance   Reason for Medically Necessary Home Health Services:  Skilled Nursing- Changes in Medication/Medication Management   Therapy- Home Adaptation to Facilitate Safety   Therapy- Therapeutic Exercises to Increase Strength and Endurance   For home use only DME standard manual wheelchair  As directed    Comments:     Patient suffers from deconditioning secondary to non-Hodgkin's lymphoma, recent prolonged hospitalization which impairs their ability to perform daily activities like bathing, mobility in the home.  A cane/walker will not resolve  issue with performing activities of daily living. A wheelchair will allow patient to  safely perform daily activities. Patient can safely propel the wheelchair in the home or has a caregiver who can provide assistance.  Accessories: elevating leg rests (ELRs), wheel locks, extensions and anti-tippers.   Home Health  As directed    Scheduling Instructions:     RN needed to reinforce teaching about insulin administration to facilitate discharge home and safety at home.   Questions:     To provide the following care/treatments:  PT   RN   Increase activity slowly  As directed    Walker   As directed        Medication List    STOP taking these medications       allopurinol 300 MG  tablet  Commonly known as:  ZYLOPRIM      TAKE these medications       acetaminophen 325 MG tablet  Commonly known as:  TYLENOL  Take 2 tablets (650 mg total) by mouth every 6 (six) hours as needed.     aspirin 81 MG tablet  Take 1 tablet (81 mg total) by mouth daily.     bisoprolol 10 MG tablet  Commonly known as:  ZEBETA  Take 1 tablet (10 mg total) by mouth daily.     dextromethorphan 30 MG/5ML liquid  Commonly known as:  DELSYM  Take 10 mLs (60 mg total) by mouth 2 (two) times daily.     insulin glargine 100 units/mL Soln  Commonly known as:  LANTUS  Inject 20 Units into the skin daily at 10 pm.     omeprazole 40 MG capsule  Commonly known as:  PRILOSEC  Take 1 capsule (40 mg total) by mouth daily.     ondansetron 4 MG tablet  Commonly known as:  ZOFRAN  Take 1 tablet (4 mg total) by mouth every 6 (six) hours as needed for nausea.     oxyCODONE 5 MG immediate release tablet  Commonly known as:  Oxy IR/ROXICODONE  Take 1 tablet (5 mg total) by mouth every 6 (six) hours as needed.     protein supplement 6 g Powd  Commonly known as:  RESOURCE BENEPROTEIN  Take 1 scoop (6 g total) by mouth 3 (three) times daily with meals.     valACYclovir 1000 MG tablet  Commonly known as:  VALTREX  Take 1 tablet (1,000 mg total) by mouth 3 (three) times daily.          The  results of significant diagnostics from this hospitalization (including imaging, microbiology, ancillary and laboratory) are listed below for reference.    Significant Diagnostic Studies: X-ray Chest Pa And Lateral 04/10/2013 CLINICAL DATA: 72 year old female with cough and weakness. Lymphoma. EXAM: CHEST 2 VIEW COMPARISON: Chest CT 03/30/2013 and earlier. FINDINGS: Stable prominent right hilum. See recent CT report. Other mediastinal contours are within normal limits. Visualized tracheal air column is within normal limits. No pneumothorax, pulmonary edema, pleural effusion or confluent pulmonary opacity. No acute osseous abnormality identified. IMPRESSION: No new cardiopulmonary abnormality. Hilar lymphadenopathy, see 03/30/2013 chest CT. Electronically Signed By: Augusto Gamble M.D. On: 04/10/2013 17:51  2-D echocardiogram 04/10/2013: Study conclusions: EF 50-55%. Doppler parameters consistent with abnormal left ventricular relaxation current disease grade 1 diastolic dysfunction) Dg Chest 1 View 04/15/2013 CLINICAL DATA: Fevers EXAM: CHEST - 1 VIEW COMPARISON: 04/10/2013 FINDINGS: Stable cardiac silhouette. There is increased bandlike densities in the right lower lobe. Linear thickening at the left lung base additionally. No pneumothorax. Lungs are hyperinflated. IMPRESSION: Bibasilar linear bandlike opacities are concerning for pneumonia. Electronically Signed By: Genevive Bi M.D. On: 04/15/2013 15:49  Ct Head Wo Contrast 04/14/2013 *RADIOLOGY REPORT* Clinical Data: Lymphoma. Lethargy CT HEAD WITHOUT CONTRAST Technique: Contiguous axial images were obtained from the base of the skull through the vertex without contrast. Comparison: MRI 11/26/2004. CT 11/25/2004 Findings: Mild atrophy. Negative for hydrocephalus. Mild hypodensity in the periventricular white matter similar to the prior study. No acute infarct. Negative for hemorrhage or mass. IMPRESSION: Mild atrophy and mild chronic changes in the white matter.  No acute abnormality. Original Report Authenticated By: Janeece Riggers, M.D.  Ir Fluoro Guide Cv Line Right 04/18/2013 CLINICAL DATA: Cancer EXAM: TUNNEL POWER PORT PLACEMENT WITH SUBCUTANEOUS POCKET UTILIZING ULTRASOUND & FLOUROSCOPY MEDICATIONS AND MEDICAL HISTORY:  Versed 2.0 mg, Fentanyl 100 mcg. Vancomycin was given within two hours of incision. Vancomycin was given due to an antibiotic allergy. ANESTHESIA/SEDATION: Moderate sedation time: 25 minutes CONTRAST: None FLUOROSCOPY TIME: 1 min and 6 seconds. PROCEDURE: After written informed consent was obtained, patient was placed in the supine position on angiographic table. The right neck and chest was prepped and draped in a sterile fashion. Lidocaine was utilized for local anesthesia. The right internal jugular vein was noted to be patent initially with ultrasound. Under sonographic guidance, a micropuncture needle was inserted into the right IJ vein (Ultrasound and fluoroscopic image documentation was performed). The needle was removed over an 018 wire which was exchanged for a Amplatz. This was advanced into the IVC. An 8-French dilator was advanced over the Amplatz. A small incision was made in the right upper chest over the anterior right second rib. Utilizing blunt dissection, a subcutaneous pocket was created in the caudal direction. The pocket was irrigated with a copious amount of sterile normal saline. The port catheter was tunneled from the chest incision, and out the neck incision. The reservoir was inserted into the subcutaneous pocket and secured with two 3-0 Ethilon stitches. A peel-away sheath was advanced over the Amplatz wire. The port catheter was cut to measure length and inserted through the peel-away sheath. The peel-away sheath was removed. The chest incision was closed with 3-0 Vicryl interrupted stitches for the subcutaneous tissue and a running of 4-0 Vicryl subcuticular stitch for the skin. The neck incision was closed with a 4-0 Vicryl  subcuticular stitch. Derma-bond was applied to both surgical incisions. The port reservoir was flushed and instilled with heparinized saline. No complications. FINDINGS: A right IJ vein Port-A-Cath is in place with its tip at the cavoatrial junction. IMPRESSION: Successful 8 French right internal jugular vein power port placement with its tip at the SVC/RA junction. Electronically Signed By: Maryclare Bean M.D. On: 04/18/2013 09:39  Ct Biopsy 04/15/2013 *RADIOLOGY REPORT* Indication: New diagnosis of non-Hodgkin's lymphoma. CT GUIDED LEFT ILIAC BONE MARROW ASPIRATION AND BONE MARROW CORE BIOPSIES Intravenous medications: Fentanyl 50 mcg IV; Versed 0.5 mg IV Sedation time: 10 minutes Contrast volume: None Complications: None immediate PROCEDURE/FINDINGS: Informed consent was obtained from the patient following an explanation of the procedure, risks, benefits and alternatives. The patient understands, agrees and consents for the procedure. All questions were addressed. A time out was performed prior to the initiation of the procedure. The patient was positioned prone and noncontrast localization CT was performed of the pelvis to demonstrate the iliac marrow spaces. The operative site was prepped and draped in the usual sterile fashion. Under sterile conditions and local anesthesia, an 11 gauge coaxial bone biopsy needle was advanced into the left iliac marrow space. Needle position was confirmed with CT imaging. Initially, bone marrow aspiration was performed. Next, a bone marrow biopsy was obtained with the 11 gauge outer bone marrow device. The 11 gauge coaxial bone biopsy needle was readvanced into a slightly different location within the left iliac marrow space, positioning was confirmed and an additional bone marrow biopsy was obtained. Samples were prepared with the cytotechnologist and deemed adequate. The needle was removed intact. Hemostasis was obtained with compression and a dressing was placed. The patient  tolerated the procedure well without immediate post procedural complication. IMPRESSION: Successful CT guided left iliac bone marrow aspiration and core biopsies. Original Report Authenticated By: Tacey Ruiz, MD    Labs:  Basic Metabolic Panel:  Recent Labs Lab 04/18/13 0510 04/19/13  0448 04/20/13 0410 04/21/13 0430 04/22/13 0530 04/23/13 0500  NA 139  140 133* 134* 135 137 133*  K 3.4*  3.4* 3.4* 3.9 4.2 4.1 4.0  CL 104  104 100 102 103 105 103  CO2 24  24 21 20 20 22 22   GLUCOSE 203*  207* 282* 302* 315* 303* 248*  BUN 22  22 30* 42* 52* 52* 42*  CREATININE 1.23*  1.25* 1.39* 1.69* 1.79* 1.60* 1.30*  CALCIUM 8.1*  8.3* 7.6* 7.7* 7.6* 7.6* 8.0*  MG 1.6 1.6 1.6 1.7 1.6  --   PHOS 2.5 2.4 2.8 3.4 3.3  --    GFR Estimated Creatinine Clearance: 39.2 ml/min (by C-G formula based on Cr of 1.3). Liver Function Tests:  Recent Labs Lab 04/19/13 0448 04/20/13 0410 04/21/13 0430 04/22/13 0530 04/23/13 0500  AST 42* 44* 28 24 25   ALT 8 10 11 10 12   ALKPHOS 102 124* 121* 157* 121*  BILITOT 1.0 1.1 1.0 1.1 1.2  PROT 4.7* 4.6* 4.3* 4.1* 4.1*  ALBUMIN 1.2* 1.2* 1.2* 1.2* 1.3*    CBC:  Recent Labs Lab 04/19/13 0448 04/20/13 0410 04/21/13 0430 04/22/13 0530 04/23/13 0500  WBC 7.7 28.6* 22.6* 12.4* 6.5  HGB 7.2* 9.5* 8.8* 7.9* 8.2*  HCT 23.6* 29.5* 26.6* 24.6* 25.5*  MCV 78.7 79.7 80.1 80.1 81.0  PLT 86* 89* 71* 56* 53*   CBG:  Recent Labs Lab 04/23/13 1217 04/23/13 1634 04/23/13 2119 04/24/13 0818 04/24/13 1140  GLUCAP 291* 206* 178* 214* 284*   Microbiology Recent Results (from the past 240 hour(s))  CULTURE, BLOOD (ROUTINE X 2)     Status: None   Collection Time    04/15/13  3:43 PM      Result Value Range Status   Specimen Description BLOOD LEFT HAND   Final   Special Requests BOTTLES DRAWN AEROBIC ONLY 3CC   Final   Culture  Setup Time     Final   Value: 04/16/2013 03:42     Performed at [facility]   Culture     Final   Value:  NO GROWTH 5 DAYS     Performed at [facility]   Report Status 04/22/2013 FINAL   Final  CULTURE, BLOOD (ROUTINE X 2)     Status: None   Collection Time    04/15/13  3:47 PM      Result Value Range Status   Specimen Description BLOOD RIGHT HAND   Final   Special Requests BOTTLES DRAWN AEROBIC ONLY 3CC   Final   Culture  Setup Time     Final   Value: 04/16/2013 03:42     Performed at [facility]   Culture     Final   Value: NO GROWTH 5 DAYS     Performed at [facility]   Report Status 04/22/2013 FINAL   Final    Time coordinating discharge: 40 minutes.  Signed:  Meloney Feld  Pager [phone] [facility] 04/24/2013, 12:02 PM

## 2013-04-24 NOTE — Progress Notes (Signed)
Physical Therapy Treatment Patient Details Name: Tonya Wise MRN: [id number] DOB: March 28, 1941 Today's Date: 04/24/2013 Time: 0926-0950 PT Time Calculation (min): 24 min  PT Assessment / Plan / Recommendation  History of Present Illness pt continues with edema in legs, but appears to be continuing with gradual improvements   PT Comments   Assisted pt [facility]Ob to [facility] to void then amb in hallway.  Pt c/o "fat feet" and "heavy legs" requiring increased time.   Follow Up Recommendations  Home health PT     Does the patient have the potential to tolerate intense rehabilitation     Barriers to Discharge        Equipment Recommendations  3in1 (PT);Wheelchair (measurements PT);Hospital bed    Recommendations for Other Services    Frequency Min 3X/week   Progress towards PT Goals Progress towards PT goals: Progressing toward goals  Plan      Precautions / Restrictions Precautions Precautions: Fall Restrictions Weight Bearing Restrictions: No    Pertinent Vitals/Pain C/o "heavy legs"    Mobility  Bed Mobility Bed Mobility: Supine to Sit Supine to Sit: 3: Mod assist;HOB elevated Details for Bed Mobility Assistance: Mod assist to support B LE off bed with increased time Transfers Transfers: Sit to Stand;Stand to Sit Sit to Stand: 4: Min assist;From bed;From toilet Stand to Sit: To chair/3-in-1;To toilet;4: Min assist Details for Transfer Assistance: 25% VC's on safety with turns and hand placement with stand to sit Ambulation/Gait Ambulation/Gait Assistance: 4: Min assist Ambulation Distance (Feet): 24 Feet Assistive device: Rolling walker Ambulation/Gait Assistance Details: <25% Vc's on proper walker to self distance and increased time with MAX c/o "fat feet" and "heavy legs". Gait Pattern: Step-through pattern;Decreased step length - right;Decreased step length - left Gait velocity: decreased     PT Goals (current goals can now be found in the care plan section)    Visit  Information  Last PT Received On: 04/24/13 Assistance Needed: +2 History of Present Illness: pt continues with edema in legs, but appears to be continuing with gradual improvements    Subjective Data      Cognition       Balance     End of Session PT - End of Session Equipment Utilized During Treatment: Gait belt Activity Tolerance: Patient limited by fatigue   Felecia Shelling  PTA [facility]  Acute  Rehab Pager      [phone]

## 2013-04-24 NOTE — Progress Notes (Signed)
Patient received discharge instructions with husband at bedside. Patient's husband verbalized understanding without further questions. Patient follow up appointments and medications discussed. Patient belongings packed. Patient to be taken downstairs via wheelchair to be discharged home.

## 2013-04-25 ENCOUNTER — Inpatient Hospital Stay (HOSPITAL_COMMUNITY): Payer: Medicare Other

## 2013-04-25 ENCOUNTER — Inpatient Hospital Stay (HOSPITAL_COMMUNITY)
Admit: 2013-04-25 | Discharge: 2013-04-29 | DRG: 808 | Disposition: A | Discharge status: Other Acute Inpatient Hospital | Payer: Medicare Other | Source: Ambulatory Visit | Attending: Internal Medicine | Admitting: Internal Medicine

## 2013-04-25 DIAGNOSIS — K760 Fatty (change of) liver, not elsewhere classified: Secondary | ICD-10-CM | POA: Diagnosis present

## 2013-04-25 DIAGNOSIS — J984 Other disorders of lung: Secondary | ICD-10-CM | POA: Diagnosis not present

## 2013-04-25 DIAGNOSIS — D61810 Antineoplastic chemotherapy induced pancytopenia: Secondary | ICD-10-CM | POA: Diagnosis not present

## 2013-04-25 DIAGNOSIS — Z9221 Personal history of antineoplastic chemotherapy: Secondary | ICD-10-CM

## 2013-04-25 DIAGNOSIS — K7689 Other specified diseases of liver: Secondary | ICD-10-CM | POA: Diagnosis present

## 2013-04-25 DIAGNOSIS — B370 Candidal stomatitis: Secondary | ICD-10-CM

## 2013-04-25 DIAGNOSIS — R627 Adult failure to thrive: Secondary | ICD-10-CM | POA: Diagnosis not present

## 2013-04-25 DIAGNOSIS — D709 Neutropenia, unspecified: Secondary | ICD-10-CM | POA: Diagnosis not present

## 2013-04-25 DIAGNOSIS — Z880 Allergy status to penicillin: Secondary | ICD-10-CM

## 2013-04-25 DIAGNOSIS — T451X5A Adverse effect of antineoplastic and immunosuppressive drugs, initial encounter: Principal | ICD-10-CM | POA: Diagnosis present

## 2013-04-25 DIAGNOSIS — N179 Acute kidney failure, unspecified: Secondary | ICD-10-CM

## 2013-04-25 DIAGNOSIS — E876 Hypokalemia: Secondary | ICD-10-CM | POA: Diagnosis present

## 2013-04-25 DIAGNOSIS — G9341 Metabolic encephalopathy: Secondary | ICD-10-CM

## 2013-04-25 DIAGNOSIS — I10 Essential (primary) hypertension: Secondary | ICD-10-CM | POA: Diagnosis present

## 2013-04-25 DIAGNOSIS — R7989 Other specified abnormal findings of blood chemistry: Secondary | ICD-10-CM | POA: Diagnosis present

## 2013-04-25 DIAGNOSIS — B004 Herpesviral encephalitis: Secondary | ICD-10-CM | POA: Diagnosis present

## 2013-04-25 DIAGNOSIS — I5042 Chronic combined systolic (congestive) and diastolic (congestive) heart failure: Secondary | ICD-10-CM | POA: Diagnosis not present

## 2013-04-25 DIAGNOSIS — Z66 Do not resuscitate: Secondary | ICD-10-CM | POA: Diagnosis present

## 2013-04-25 DIAGNOSIS — K209 Esophagitis, unspecified without bleeding: Secondary | ICD-10-CM | POA: Diagnosis present

## 2013-04-25 DIAGNOSIS — K208 Other esophagitis without bleeding: Secondary | ICD-10-CM | POA: Diagnosis present

## 2013-04-25 DIAGNOSIS — Z6834 Body mass index (BMI) 34.0-34.9, adult: Secondary | ICD-10-CM | POA: Diagnosis not present

## 2013-04-25 DIAGNOSIS — R079 Chest pain, unspecified: Secondary | ICD-10-CM | POA: Diagnosis not present

## 2013-04-25 DIAGNOSIS — E43 Unspecified severe protein-calorie malnutrition: Secondary | ICD-10-CM | POA: Diagnosis present

## 2013-04-25 DIAGNOSIS — E1169 Type 2 diabetes mellitus with other specified complication: Secondary | ICD-10-CM | POA: Diagnosis present

## 2013-04-25 DIAGNOSIS — R5081 Fever presenting with conditions classified elsewhere: Secondary | ICD-10-CM | POA: Diagnosis present

## 2013-04-25 DIAGNOSIS — Z79899 Other long term (current) drug therapy: Secondary | ICD-10-CM | POA: Diagnosis not present

## 2013-04-25 DIAGNOSIS — R6889 Other general symptoms and signs: Secondary | ICD-10-CM

## 2013-04-25 DIAGNOSIS — J189 Pneumonia, unspecified organism: Secondary | ICD-10-CM

## 2013-04-25 DIAGNOSIS — C8589 Other specified types of non-Hodgkin lymphoma, extranodal and solid organ sites: Secondary | ICD-10-CM | POA: Diagnosis present

## 2013-04-25 DIAGNOSIS — Z794 Long term (current) use of insulin: Secondary | ICD-10-CM | POA: Diagnosis not present

## 2013-04-25 DIAGNOSIS — D638 Anemia in other chronic diseases classified elsewhere: Secondary | ICD-10-CM

## 2013-04-25 DIAGNOSIS — D630 Anemia in neoplastic disease: Secondary | ICD-10-CM | POA: Diagnosis present

## 2013-04-25 DIAGNOSIS — R5381 Other malaise: Secondary | ICD-10-CM | POA: Diagnosis not present

## 2013-04-25 DIAGNOSIS — R509 Fever, unspecified: Secondary | ICD-10-CM | POA: Diagnosis not present

## 2013-04-25 DIAGNOSIS — I5189 Other ill-defined heart diseases: Secondary | ICD-10-CM

## 2013-04-25 DIAGNOSIS — I5033 Acute on chronic diastolic (congestive) heart failure: Secondary | ICD-10-CM

## 2013-04-25 DIAGNOSIS — C8590 Non-Hodgkin lymphoma, unspecified, unspecified site: Secondary | ICD-10-CM | POA: Diagnosis present

## 2013-04-25 DIAGNOSIS — E162 Hypoglycemia, unspecified: Secondary | ICD-10-CM | POA: Diagnosis present

## 2013-04-25 DIAGNOSIS — R5383 Other fatigue: Secondary | ICD-10-CM | POA: Diagnosis present

## 2013-04-25 DIAGNOSIS — Z4682 Encounter for fitting and adjustment of non-vascular catheter: Secondary | ICD-10-CM | POA: Diagnosis not present

## 2013-04-25 DIAGNOSIS — M6281 Muscle weakness (generalized): Secondary | ICD-10-CM

## 2013-04-25 DIAGNOSIS — E119 Type 2 diabetes mellitus without complications: Secondary | ICD-10-CM | POA: Diagnosis present

## 2013-04-25 DIAGNOSIS — R109 Unspecified abdominal pain: Secondary | ICD-10-CM | POA: Diagnosis not present

## 2013-04-25 DIAGNOSIS — R739 Hyperglycemia, unspecified: Secondary | ICD-10-CM

## 2013-04-25 LAB — COMPREHENSIVE METABOLIC PANEL
ALT: 16 U/L (ref 0–35)
AST: 35 U/L (ref 0–37)
Albumin: 1.5 g/dL — ABNORMAL LOW (ref 3.5–5.2)
CO2: 23 mEq/L (ref 19–32)
Chloride: 102 mEq/L (ref 96–112)
Creatinine, Ser: 1 mg/dL (ref 0.50–1.10)
GFR calc Af Amer: 64 mL/min — ABNORMAL LOW (ref >90)
GFR calc non Af Amer: 55 mL/min — ABNORMAL LOW (ref >90)
Glucose, Bld: 135 mg/dL — ABNORMAL HIGH (ref 70–99)
Sodium: 134 mEq/L — ABNORMAL LOW (ref 135–145)
Total Bilirubin: 1 mg/dL (ref 0.3–1.2)

## 2013-04-25 LAB — URINE MICROSCOPIC-ADD ON

## 2013-04-25 LAB — CBC WITH DIFFERENTIAL/PLATELET
Basophils Absolute: 0 10*3/uL (ref 0.0–0.1)
Eosinophils Absolute: 0 10*3/uL (ref 0.0–0.7)
HCT: 21.1 % — ABNORMAL LOW (ref 36.0–46.0)
Hemoglobin: 6.9 g/dL — CL (ref 12.0–15.0)
Lymphocytes Relative: 39 % (ref 12–46)
Lymphs Abs: 0.3 10*3/uL — ABNORMAL LOW (ref 0.7–4.0)
MCH: 26.6 pg (ref 26.0–34.0)
MCHC: 32.7 g/dL (ref 30.0–36.0)
MCV: 81.5 fL (ref 78.0–100.0)
Neutro Abs: 0.4 10*3/uL — ABNORMAL LOW (ref 1.7–7.7)
Platelets: 15 10*3/uL — CL (ref 150–400)
RDW: 18.2 % — ABNORMAL HIGH (ref 11.5–15.5)

## 2013-04-25 LAB — PROTIME-INR
INR: 1.4 (ref 0.00–1.49)
Prothrombin Time: 16.8 seconds — ABNORMAL HIGH (ref 11.6–15.2)

## 2013-04-25 LAB — URIC ACID: Uric Acid, Serum: 2.7 mg/dL (ref 2.4–7.0)

## 2013-04-25 LAB — URINALYSIS, ROUTINE W REFLEX MICROSCOPIC
Bilirubin Urine: NEGATIVE
Hgb urine dipstick: NEGATIVE
Leukocytes, UA: NEGATIVE
Protein, ur: 30 mg/dL — AB
Urobilinogen, UA: 0.2 mg/dL (ref 0.0–1.0)

## 2013-04-25 LAB — PHOSPHORUS: Phosphorus: 2 mg/dL — ABNORMAL LOW (ref 2.3–4.6)

## 2013-04-25 LAB — GLUCOSE, CAPILLARY: Glucose-Capillary: 169 mg/dL — ABNORMAL HIGH (ref 70–99)

## 2013-04-25 MED ORDER — ENOXAPARIN SODIUM 40 MG/0.4ML ~~LOC~~ SOLN
40.0000 mg | SUBCUTANEOUS | Status: DC
  Filled 2013-04-25: qty 0.4

## 2013-04-25 MED ORDER — ASPIRIN 81 MG PO TABS: 81.0000 mg | ORAL_TABLET | Freq: Every day | ORAL | Status: DC

## 2013-04-25 MED ORDER — ONDANSETRON HCL 4 MG PO TABS: 4.0000 mg | ORAL_TABLET | Freq: Four times a day (QID) | ORAL | Status: DC | PRN

## 2013-04-25 MED ORDER — RESOURCE INSTANT PROTEIN PO PWD PACKET
1.0000 | Freq: Three times a day (TID) | ORAL | Status: DC
  Administered 2013-04-26 – 2013-04-29 (×8): 6 g via ORAL
  Filled 2013-04-25 (×14): qty 6

## 2013-04-25 MED ORDER — ALUM & MAG HYDROXIDE-SIMETH 200-200-20 MG/5ML PO SUSP: 30.0000 mL | Freq: Four times a day (QID) | ORAL | Status: DC | PRN

## 2013-04-25 MED ORDER — ACYCLOVIR SODIUM 50 MG/ML IV SOLN
500.0000 mg | Freq: Two times a day (BID) | INTRAVENOUS | Status: DC
  Administered 2013-04-25 – 2013-04-26 (×2): 500 mg via INTRAVENOUS
  Filled 2013-04-25 (×3): qty 10

## 2013-04-25 MED ORDER — INSULIN DETEMIR 100 UNIT/ML ~~LOC~~ SOLN
20.0000 [IU] | Freq: Every day | SUBCUTANEOUS | Status: DC
  Administered 2013-04-25 – 2013-04-26 (×2): 20 [IU] via SUBCUTANEOUS
  Filled 2013-04-25 (×3): qty 0.2

## 2013-04-25 MED ORDER — MORPHINE SULFATE 2 MG/ML IJ SOLN: 2.0000 mg | INTRAMUSCULAR | Status: DC | PRN

## 2013-04-25 MED ORDER — VANCOMYCIN HCL IN DEXTROSE 1-5 GM/200ML-% IV SOLN
1000.0000 mg | INTRAVENOUS | Status: AC
  Administered 2013-04-25: 1000 mg via INTRAVENOUS
  Filled 2013-04-25: qty 200

## 2013-04-25 MED ORDER — VALACYCLOVIR HCL 500 MG PO TABS
1000.0000 mg | ORAL_TABLET | Freq: Three times a day (TID) | ORAL | Status: DC
  Filled 2013-04-25 (×2): qty 2

## 2013-04-25 MED ORDER — FILGRASTIM 480 MCG/1.6ML IJ SOLN
480.0000 ug | Freq: Every day | INTRAMUSCULAR | Status: DC
  Administered 2013-04-25 – 2013-04-28 (×4): 480 ug via SUBCUTANEOUS
  Filled 2013-04-25 (×8): qty 1.6

## 2013-04-25 MED ORDER — INSULIN DETEMIR 100 UNIT/ML FLEXPEN: 20.0000 [IU] | PEN_INJECTOR | Freq: Every day | SUBCUTANEOUS | Status: DC

## 2013-04-25 MED ORDER — INSULIN ASPART 100 UNIT/ML ~~LOC~~ SOLN
0.0000 [IU] | Freq: Three times a day (TID) | SUBCUTANEOUS | Status: DC
  Administered 2013-04-25 – 2013-04-26 (×2): 2 [IU] via SUBCUTANEOUS

## 2013-04-25 MED ORDER — SODIUM CHLORIDE 0.9 % IV SOLN
1.0000 g | Freq: Three times a day (TID) | INTRAVENOUS | Status: DC
  Administered 2013-04-26 – 2013-04-29 (×10): 1 g via INTRAVENOUS
  Filled 2013-04-25 (×12): qty 1

## 2013-04-25 MED ORDER — SODIUM CHLORIDE 0.9 % IV SOLN
INTRAVENOUS | Status: DC
  Administered 2013-04-25: 100 mL/h via INTRAVENOUS
  Administered 2013-04-26: 06:00:00 via INTRAVENOUS

## 2013-04-25 MED ORDER — OXYCODONE HCL 5 MG PO TABS: 5.0000 mg | ORAL_TABLET | ORAL | Status: DC | PRN

## 2013-04-25 MED ORDER — VANCOMYCIN HCL IN DEXTROSE 750-5 MG/150ML-% IV SOLN
750.0000 mg | Freq: Two times a day (BID) | INTRAVENOUS | Status: DC
  Administered 2013-04-26 – 2013-04-29 (×6): 750 mg via INTRAVENOUS
  Filled 2013-04-25 (×8): qty 150

## 2013-04-25 MED ORDER — ACETAMINOPHEN 325 MG PO TABS: 650.0000 mg | ORAL_TABLET | Freq: Four times a day (QID) | ORAL | Status: DC | PRN

## 2013-04-25 MED ORDER — VALACYCLOVIR HCL 500 MG PO TABS
1000.0000 mg | ORAL_TABLET | Freq: Two times a day (BID) | ORAL | Status: DC
  Filled 2013-04-25 (×2): qty 2

## 2013-04-25 MED ORDER — ASPIRIN 81 MG PO CHEW
81.0000 mg | CHEWABLE_TABLET | Freq: Every day | ORAL | Status: DC
  Administered 2013-04-26 – 2013-04-29 (×4): 81 mg via ORAL
  Filled 2013-04-25 (×4): qty 1

## 2013-04-25 MED ORDER — INSULIN ASPART 100 UNIT/ML ~~LOC~~ SOLN: 0.0000 [IU] | Freq: Every day | SUBCUTANEOUS | Status: DC

## 2013-04-25 MED ORDER — POTASSIUM PHOSPHATE DIBASIC 3 MMOLE/ML IV SOLN
10.0000 mmol | Freq: Once | INTRAVENOUS | Status: AC
  Administered 2013-04-25: 10 mmol via INTRAVENOUS
  Filled 2013-04-25: qty 3.33

## 2013-04-25 MED ORDER — ALLOPURINOL 300 MG PO TABS
300.0000 mg | ORAL_TABLET | Freq: Every day | ORAL | Status: DC
  Administered 2013-04-26 – 2013-04-29 (×4): 300 mg via ORAL
  Filled 2013-04-25 (×4): qty 1

## 2013-04-25 MED ORDER — MAGNESIUM SULFATE 40 MG/ML IJ SOLN
2.0000 g | Freq: Once | INTRAMUSCULAR | Status: AC
  Administered 2013-04-25: 2 g via INTRAVENOUS
  Filled 2013-04-25: qty 50

## 2013-04-25 MED ORDER — ONDANSETRON HCL 4 MG/2ML IJ SOLN: 4.0000 mg | Freq: Four times a day (QID) | INTRAMUSCULAR | Status: DC | PRN

## 2013-04-25 MED ORDER — BISOPROLOL FUMARATE 10 MG PO TABS
10.0000 mg | ORAL_TABLET | Freq: Every day | ORAL | Status: DC
  Administered 2013-04-26 – 2013-04-29 (×4): 10 mg via ORAL
  Filled 2013-04-25 (×4): qty 1

## 2013-04-25 MED ORDER — DEXTROMETHORPHAN POLISTIREX 30 MG/5ML PO LQCR
60.0000 mg | Freq: Two times a day (BID) | ORAL | Status: DC
  Administered 2013-04-25 – 2013-04-29 (×4): 60 mg via ORAL
  Filled 2013-04-25 (×10): qty 10

## 2013-04-25 MED ORDER — SODIUM CHLORIDE 0.9 % IV SOLN
1.0000 g | INTRAVENOUS | Status: AC
  Administered 2013-04-25: 1 g via INTRAVENOUS
  Filled 2013-04-25: qty 1

## 2013-04-25 MED ORDER — PANTOPRAZOLE SODIUM 40 MG PO TBEC
40.0000 mg | DELAYED_RELEASE_TABLET | Freq: Every day | ORAL | Status: DC
  Administered 2013-04-26 – 2013-04-29 (×4): 40 mg via ORAL
  Filled 2013-04-25 (×4): qty 1

## 2013-04-25 MED ORDER — ACETAMINOPHEN 650 MG RE SUPP
650.0000 mg | Freq: Four times a day (QID) | RECTAL | Status: DC | PRN
  Administered 2013-04-25: 650 mg via RECTAL
  Filled 2013-04-25: qty 1

## 2013-04-25 NOTE — Progress Notes (Signed)
Pharmacy: Phosphorus replacement  Phosphorus low at 2.0 Mg Low at 1.4 Patients ideal body weight ~ 50 kg   Plan  1.) Per electrolyte replacement protocol give 0.15 mMol/kg (Ideal body weight), will round up and give potassium phosphate 10 mMol x 1 today  2.) Agree with MD to give magnesium 2 grams for replacement also.   Will f/u mg/phos in AM  Yula Crotwell, Loma Messing PharmD Pager #: [phone] 3:37 PM 04/25/2013

## 2013-04-25 NOTE — H&P (Signed)
[facility] History and Physical  Tonya Wise ZOX:[id number] DOB: 1941/05/29 DOA: 04/25/2013  Referring physician: Myself PCP: [name], MD   Chief Complaint: Weakness, FTT   History of Present Illness: Tonya Wise is an 72 y.o. female with a PMH of recently diagnosed NHL who was admitted on 04/10/2013 with dehydration, acute renal failure, hypercalcemia and rigors. She had a prolonged hospital course complicated by the development of lethargy and confusion. She underwent her first cycle of chemotherapy on 04/18/2013 after placement of a Port-A-Cath. She was slow to progress secondary to deconditioning, anorexia, and oral thrush/HSV with poor oral intake, and it was recommended that she be discharged to a rehabilitation facility however she opted to return home with home health physical therapy. She was alert and felt well at the time of discharge. Since discharge, the patient developed worsening weakness and her elderly husband is unable to manage her care safely in the home. She has been unable to stand or ambulate without 2+ assist and has not had any meaningful oral intake since discharge. Sores on her mouth seem to have gotten worse. No reported nausea, vomiting or diarrhea.  Review of Systems: Constitutional: No fever, no chills;  Appetite markedly diminished; No weight loss, no weight gain, + fatigue.  HEENT: No blurry vision, no diplopia, no pharyngitis, no dysphagia, + mouth sores CV: No chest pain, no palpitations.  Resp: No SOB, + cough. GI: No nausea, no vomiting, no diarrhea, no melena, no hematochezia.  GU: No dysuria, no hematuria. MSK: no myalgias, no arthralgias.  Neuro:  No headache, no focal neurological deficits, no history of seizures.  Psych: No depression, no anxiety.  Endo: No heat intolerance, no cold intolerance, no polyuria, no polydipsia  Skin: No rashes, no skin lesions.  Heme: + easy bruising.  Past Medical History Past Medical History  Diagnosis  Date  . Hypertension   . Renal insufficiency   . Osteopenia   . Allergy   . Heart murmur   . GERD (gastroesophageal reflux disease)   . Anemia   . Hypercalcemia 03/27/2013  . Non-Hodgkins lymphoma 04/06/2013  . Diastolic dysfunction, grade I 03/09/9146     Past Surgical History Past Surgical History  Procedure Laterality Date  . Appendectomy    . Colonoscopy  2008  . Spine surgery    . Back surgery      LOWER BACK TWICE  . Abdominal hysterectomy    . Esophagogastroduodenoscopy N/A 03/29/2013    Procedure: ESOPHAGOGASTRODUODENOSCOPY (EGD);  Surgeon: Charna Elizabeth, MD;  Location: [facility] ENDOSCOPY;  Service: Endoscopy;  Laterality: N/A;  . Inguinal lymph node biopsy Right 03/31/2013    Procedure: INGUINAL LYMPH NODE BIOPSY;  Surgeon: Cherylynn Ridges, MD;  Location: [facility] OR;  Service: General;  Laterality: Right;     Social History: History   Social History  . Marital Status: Married    Spouse Name: [name]    Number of Children: 2  . Years of Education: 12   Occupational History  .  [employer]   Social History Main Topics  . Smoking status: Former Smoker -- 0.50 packs/day for 2 years    Types: Cigarettes    Quit date: 07/30/1960  . Smokeless tobacco: Never Used  . Alcohol Use: Yes     Comment: rare-wine drinker  . Drug Use: No  . Sexual Activity: Yes    Partners: Male   Other Topics Concern  . Not on file   Social History Narrative   Married.  Lives in [place] with husband.  Ambulates with a walker when able.    Family History:  Family History  Problem Relation Age of Onset  . Heart attack Father 46  . Leukemia Mother   . Diabetes Brother   . Diabetes Sister     Allergies: Penicillins  Meds: Prior to Admission medications   Medication Sig Start Date End Date Taking? Authorizing Provider  acetaminophen (TYLENOL) 325 MG tablet Take 2 tablets (650 mg total) by mouth every 6 (six) hours as needed. 04/01/13   Christiane Ha, MD  allopurinol (ZYLOPRIM) 300 MG  tablet Take 1 tablet (300 mg total) by mouth daily. 04/24/13   Maryruth Bun Rama, MD  aspirin 81 MG tablet Take 1 tablet (81 mg total) by mouth daily. 03/31/13   Shanker Levora Dredge, MD  bisoprolol (ZEBETA) 10 MG tablet Take 1 tablet (10 mg total) by mouth daily. 03/31/13   Shanker Levora Dredge, MD  dextromethorphan (DELSYM) 30 MG/5ML liquid Take 10 mLs (60 mg total) by mouth 2 (two) times daily. 04/24/13   Maryruth Bun Rama, MD  Insulin Detemir (LEVEMIR FLEXPEN) 100 UNIT/ML SOPN Inject 20 Units into the skin at bedtime. 04/24/13   Maryruth Bun Rama, MD  omeprazole (PRILOSEC) 40 MG capsule Take 1 capsule (40 mg total) by mouth daily. 03/31/13   Shanker Levora Dredge, MD  ondansetron (ZOFRAN) 4 MG tablet Take 1 tablet (4 mg total) by mouth every 6 (six) hours as needed for nausea. 04/24/13   Maryruth Bun Rama, MD  oxyCODONE (OXY IR/ROXICODONE) 5 MG immediate release tablet Take 1 tablet (5 mg total) by mouth every 6 (six) hours as needed. 04/24/13   Maryruth Bun Rama, MD  protein supplement (RESOURCE BENEPROTEIN) 6 g POWD Take 1 scoop (6 g total) by mouth 3 (three) times daily with meals. 04/24/13   Maryruth Bun Rama, MD  valACYclovir (VALTREX) 1000 MG tablet Take 1 tablet (1,000 mg total) by mouth 3 (three) times daily. 04/24/13   Maryruth Bun Rama, MD    Physical Exam: Filed Vitals:   04/25/13 1303  Temp: 99.6 F (37.6 C)  TempSrc: Oral  SpO2: 98%     Physical Exam: Temperature 99.6 F (37.6 C), temperature source Oral, SpO2 98.00%. Gen: Lethargic. Head: Normocephalic, atraumatic. Eyes: PERRL, EOMI, sclerae nonicteric. Mouth: Oropharynx with herpes labialis and ulcerations on the tongue, thick oral secretions. Neck: Supple, no thyromegaly, no lymphadenopathy, no jugular venous distention. Chest: Lungs diminished but course. CV: Heart sounds are tachycardic, regular. Abdomen: Soft, nontender, nondistended with normal active bowel sounds. Extremities: Extremities are with 4+ pitting edema Skin: Warm and  dry. Neuro: Lethargic and disoriented; cranial nerves II through XII grossly intact. Psych: Mood and affect flat.  Labs on Admission:  Basic Metabolic Panel:  Recent Labs Lab 04/19/13 0448 04/20/13 0410 04/21/13 0430 04/22/13 0530 04/23/13 0500  NA 133* 134* 135 137 133*  K 3.4* 3.9 4.2 4.1 4.0  CL 100 102 103 105 103  CO2 21 20 20 22 22   GLUCOSE 282* 302* 315* 303* 248*  BUN 30* 42* 52* 52* 42*  CREATININE 1.39* 1.69* 1.79* 1.60* 1.30*  CALCIUM 7.6* 7.7* 7.6* 7.6* 8.0*  MG 1.6 1.6 1.7 1.6  --   PHOS 2.4 2.8 3.4 3.3  --    Liver Function Tests:  Recent Labs Lab 04/19/13 0448 04/20/13 0410 04/21/13 0430 04/22/13 0530 04/23/13 0500  AST 42* 44* 28 24 25   ALT 8 10 11 10 12   ALKPHOS 102 124* 121* 157*  121*  BILITOT 1.0 1.1 1.0 1.1 1.2  PROT 4.7* 4.6* 4.3* 4.1* 4.1*  ALBUMIN 1.2* 1.2* 1.2* 1.2* 1.3*   CBC:  Recent Labs Lab 04/19/13 0448 04/20/13 0410 04/21/13 0430 04/22/13 0530 04/23/13 0500  WBC 7.7 28.6* 22.6* 12.4* 6.5  HGB 7.2* 9.5* 8.8* 7.9* 8.2*  HCT 23.6* 29.5* 26.6* 24.6* 25.5*  MCV 78.7 79.7 80.1 80.1 81.0  PLT 86* 89* 71* 56* 53*    BNP (last 3 results)  Recent Labs  04/22/13 0530  PROBNP 16301.0*   CBG:  Recent Labs Lab 04/23/13 1217 04/23/13 1634 04/23/13 2119 04/24/13 0818 04/24/13 1140  GLUCAP 291* 206* 178* 214* 284*    Radiological Exams on Admission: No results found.  EKG: Ordered.  Assessment/Plan Principal Problem:  Lethargy worrisome for herpes encephalitis in a patient with Non-Hodgkin's lymphoma / FTT, recent hypercalcemia, status post chemotherapy -Underwent bone marrow biopsy of left iliac crest 04/15/2013: Cytology positive for lymphoma.  -Port-A-Cath placed 04/18/2013. First cycle of R-CHOP done 04/18/2013.  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 -Routine labs have been ordered and I have  spoken with interventional radiology about obtaining an emergent LP if lab work indicates it is safe to proceed. -Will also obtain blood cultures x2 and send urinalysis. -Spoke with Dr. Darnelle Catalan about patient's condition and plan of care.  Will formally consult Dr. Rosie Fate, the patient's oncologist in the morning. Active Problems:  Diabetes  -Will continue 20 units of Levemir daily and add SSI, moderate scale q. a.c./at bedtime.  Recent Hospital-acquired pneumonia  -Status post 7 days of treatment with vancomycin and cefepime followed by Levaquin.  -Repeat chest x-ray to ensure no evidence of recurrent pneumonia. 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 Was discharged home on therapy with Valtrex. Send CMV serologies and oral viral culture. -Oral lesions seem to have progressed. We'll start high dose acyclovir per pharmacy. Anorexia / severe protein calorie malnutrition  -Seen by dietitian on 04/13/2013.   Grade I diastolic dysfunction  -No current evidence of decompensated heart failure. Chronic cough  - Chest x-ray ordered. Continue Delsym. Microcytic anemia, chronic  -S/P normal EGD on 03/29/13. BX negative for celiac sprue.  -Likely anemia of chronic disease related to malignancy.  -Status post 1 unit of packed red blood cells 04/15/2013, and 2 additional units 04/19/2013.  Acute renal failure  -Followup creatinine. Elevated LFTs / hepatic steatosis  -Hepatic steatosis noted on CT abdomen/pelvis done 03/30/13.  Code Status: DNR Family Communication: Husband updated at bedside. Disposition Plan: Likely will need SNF for rehab.  Time spent: 75 minutes.  RAMA,CHRISTINA [facility] Pager [phone]  If 7PM-7AM, please contact night-coverage [email or web address] Password [email or web address] 04/25/2013, 1:18 PM

## 2013-04-25 NOTE — Progress Notes (Signed)
CRITICAL VALUE ALERT  Critical value received:  WBC 0.8, HGB 6.9, PLT 15  Date of notification:  04/25/13  Time of notification:  1512  Critical value read back: yes  Nurse who received alert:  Addison Naegeli, RN  MD notified (1st page):  Rama  Time of first page:  1513  MD notified (2nd page):  Time of second page:  Responding MD:  Rama  Time MD responded:  [phone]

## 2013-04-25 NOTE — Progress Notes (Signed)
Pharmacy Consult Note: Vancomycin / Meropenem  Please see previous Pharmacists' notes for full details.  Now adding Vancomycin and Meropenem for empiric coverage of neutropenia, possible meningitis.  Wt: 83.6kg Ht: 62in IBW: 50kg  SCr: 1.0 CrCl ~50 ml/min WBC: 0.7, ANC 0.4  9/27 blood x2: 9/27 CSF:  Plan:  Vancomycin 1g IV STAT, then 750mg  IV q12h  Check trough at steady state  Meropenem 1g IV q8h Follow up renal function & cultures  Loralee Pacas, PharmD, BCPS Pager: [phone] 04/25/2013 4:27 PM

## 2013-04-25 NOTE — Progress Notes (Signed)
ANTIBIOTIC CONSULT NOTE - INITIAL  Pharmacy Consult for Acyclovir Indication: r/o oral/esophageal HSV, possible HSV meningitis   Allergies  Allergen Reactions  . Penicillins Rash    Patient Measurements:   Adjusted Body Weight:   Vital Signs: Temp: 99.6 F (37.6 C) (09/27 1303) Temp src: Oral (09/27 1303) Intake/Output from previous day:   Intake/Output from this shift:    Labs:  Recent Labs  04/23/13 0500  WBC 6.5  HGB 8.2*  PLT 53*  CREATININE 1.30*   The CrCl is unknown because both a height and weight (above a minimum accepted value) are required for this calculation. No results found for this basename: VANCOTROUGH, VANCOPEAK, VANCORANDOM, GENTTROUGH, GENTPEAK, GENTRANDOM, TOBRATROUGH, TOBRAPEAK, TOBRARND, AMIKACINPEAK, AMIKACINTROU, AMIKACIN,  in the last 72 hours   Microbiology: Recent Results (from the past 720 hour(s))  CULTURE, BLOOD (ROUTINE X 2)     Status: None   Collection Time    03/28/13 10:40 PM      Result Value Range Status   Specimen Description BLOOD LEFT ARM   Final   Special Requests BOTTLES DRAWN AEROBIC AND ANAEROBIC 10CC   Final   Culture  Setup Time     Final   Value: 03/29/2013 02:04     Performed at [facility]   Culture     Final   Value: NO GROWTH 5 DAYS     Performed at [facility]   Report Status 04/04/2013 FINAL   Final  CULTURE, BLOOD (ROUTINE X 2)     Status: None   Collection Time    03/28/13 10:50 PM      Result Value Range Status   Specimen Description BLOOD LEFT HAND   Final   Special Requests BOTTLES DRAWN AEROBIC ONLY 10CC   Final   Culture  Setup Time     Final   Value: 03/29/2013 02:04     Performed at [facility]   Culture     Final   Value: NO GROWTH 5 DAYS     Performed at [facility]   Report Status 04/04/2013 FINAL   Final  CULTURE, BLOOD (ROUTINE X 2)     Status: None   Collection Time    04/10/13  8:05 PM      Result Value Range Status   Specimen Description BLOOD  RIGHT ARM   Final   Special Requests BOTTLES DRAWN AEROBIC AND ANAEROBIC 10CC   Final   Culture  Setup Time     Final   Value: 04/11/2013 02:12     Performed at [facility]   Culture     Final   Value: NO GROWTH 5 DAYS     Performed at [facility]   Report Status 04/17/2013 FINAL   Final  CULTURE, BLOOD (ROUTINE X 2)     Status: None   Collection Time    04/10/13  8:10 PM      Result Value Range Status   Specimen Description BLOOD RIGHT HAND   Final   Special Requests BOTTLES DRAWN AEROBIC AND ANAEROBIC 10CC   Final   Culture  Setup Time     Final   Value: 04/11/2013 02:12     Performed at [facility]   Culture     Final   Value: NO GROWTH 5 DAYS     Performed at [facility]   Report Status 04/17/2013 FINAL   Final  CULTURE, BLOOD (ROUTINE X 2)  Status: None   Collection Time    04/15/13  3:43 PM      Result Value Range Status   Specimen Description BLOOD LEFT HAND   Final   Special Requests BOTTLES DRAWN AEROBIC ONLY 3CC   Final   Culture  Setup Time     Final   Value: 04/16/2013 03:42     Performed at [facility]   Culture     Final   Value: NO GROWTH 5 DAYS     Performed at [facility]   Report Status 04/22/2013 FINAL   Final  CULTURE, BLOOD (ROUTINE X 2)     Status: None   Collection Time    04/15/13  3:47 PM      Result Value Range Status   Specimen Description BLOOD RIGHT HAND   Final   Special Requests BOTTLES DRAWN AEROBIC ONLY 3CC   Final   Culture  Setup Time     Final   Value: 04/16/2013 03:42     Performed at [facility]   Culture     Final   Value: NO GROWTH 5 DAYS     Performed at [facility]   Report Status 04/22/2013 FINAL   Final    Medical History: Past Medical History  Diagnosis Date  . Hypertension   . Renal insufficiency   . Osteopenia   . Allergy   . Heart murmur   . GERD (gastroesophageal reflux disease)   . Anemia   . Hypercalcemia 03/27/2013  . Non-Hodgkins  lymphoma 04/06/2013  . Diastolic dysfunction, grade I 1/61/0960    Medications:  Prescriptions prior to admission  Medication Sig Dispense Refill  . acetaminophen (TYLENOL) 325 MG tablet Take 2 tablets (650 mg total) by mouth every 6 (six) hours as needed.      Marland Kitchen allopurinol (ZYLOPRIM) 300 MG tablet Take 1 tablet (300 mg total) by mouth daily.  30 tablet  3  . aspirin 81 MG tablet Take 1 tablet (81 mg total) by mouth daily.  30 tablet    . bisoprolol (ZEBETA) 10 MG tablet Take 1 tablet (10 mg total) by mouth daily.  30 tablet  0  . dextromethorphan (DELSYM) 30 MG/5ML liquid Take 10 mLs (60 mg total) by mouth 2 (two) times daily.  89 mL  0  . Insulin Detemir (LEVEMIR FLEXPEN) 100 UNIT/ML SOPN Inject 20 Units into the skin at bedtime.  1 pen  11  . omeprazole (PRILOSEC) 40 MG capsule Take 1 capsule (40 mg total) by mouth daily.  30 capsule  11  . ondansetron (ZOFRAN) 4 MG tablet Take 1 tablet (4 mg total) by mouth every 6 (six) hours as needed for nausea.  20 tablet  0  . oxyCODONE (OXY IR/ROXICODONE) 5 MG immediate release tablet Take 1 tablet (5 mg total) by mouth every 6 (six) hours as needed.  30 tablet  0  . protein supplement (RESOURCE BENEPROTEIN) 6 g POWD Take 1 scoop (6 g total) by mouth 3 (three) times daily with meals.  1 Bottle  11  . valACYclovir (VALTREX) 1000 MG tablet Take 1 tablet (1,000 mg total) by mouth 3 (three) times daily.  30 tablet  1   Scheduled:  . allopurinol  300 mg Oral Daily  . aspirin  81 mg Oral Daily  . bisoprolol  10 mg Oral Daily  . dextromethorphan  60 mg Oral BID  . enoxaparin (LOVENOX) injection  40 mg Subcutaneous Q24H  . insulin  aspart  0-15 Units Subcutaneous TID WC  . insulin aspart  0-5 Units Subcutaneous QHS  . Insulin Detemir  20 Units Subcutaneous QHS  . pantoprazole  40 mg Oral Daily  . protein supplement  1 scoop Oral TID WC   Assessment: 72yo F recently admitted 9/12-9/26 with dehydration, ARF, and hypercalcemia of malignancy. Readmitted  9/27 with weakness and FTT. Pharmacy is asked to dose acyclovir high-dose for possible HSV meningitis. Was on multiple abx last admission: 9/17 >> Vancomycin >> 9/20 9/17 >> Cefepime >> 9/20 9/19 >> Fluconazole x7 >> 9/26 9/20 >> Levaquin >> 9/23  From 9/25: SCr 1.3 and weight 84kg. IBW 50kg.  Goal of Therapy:  Eradication of infection. Appropriate regimen for weight and renal function.  Plan:   Acyclovir 500mg (10mg /kg using IBW 50kg) IV q12h(d/t CrCl ~44).  Follow up renal fxn and culture results.  Charolotte Eke, PharmD, pager [phone]. 04/25/2013,2:22 PM.

## 2013-04-26 ENCOUNTER — Encounter (HOSPITAL_COMMUNITY): Payer: Self-pay | Admitting: *Deleted

## 2013-04-26 DIAGNOSIS — R5081 Fever presenting with conditions classified elsewhere: Secondary | ICD-10-CM | POA: Diagnosis not present

## 2013-04-26 DIAGNOSIS — F29 Unspecified psychosis not due to a substance or known physiological condition: Secondary | ICD-10-CM

## 2013-04-26 DIAGNOSIS — C8589 Other specified types of non-Hodgkin lymphoma, extranodal and solid organ sites: Secondary | ICD-10-CM

## 2013-04-26 DIAGNOSIS — B370 Candidal stomatitis: Secondary | ICD-10-CM

## 2013-04-26 DIAGNOSIS — D709 Neutropenia, unspecified: Secondary | ICD-10-CM | POA: Diagnosis present

## 2013-04-26 DIAGNOSIS — R5381 Other malaise: Secondary | ICD-10-CM

## 2013-04-26 DIAGNOSIS — R630 Anorexia: Secondary | ICD-10-CM

## 2013-04-26 DIAGNOSIS — D61810 Antineoplastic chemotherapy induced pancytopenia: Principal | ICD-10-CM

## 2013-04-26 LAB — PREPARE RBC (CROSSMATCH)

## 2013-04-26 LAB — CBC WITH DIFFERENTIAL/PLATELET
Basophils Absolute: 0 10*3/uL (ref 0.0–0.1)
Eosinophils Absolute: 0 10*3/uL (ref 0.0–0.7)
Eosinophils Relative: 2 % (ref 0–5)
Hemoglobin: 6.2 g/dL — CL (ref 12.0–15.0)
Lymphs Abs: 0.2 10*3/uL — ABNORMAL LOW (ref 0.7–4.0)
MCH: 26.7 pg (ref 26.0–34.0)
MCHC: 33 g/dL (ref 30.0–36.0)
Monocytes Absolute: 0 10*3/uL — ABNORMAL LOW (ref 0.1–1.0)
Neutrophils Relative %: 39 % — ABNORMAL LOW (ref 43–77)
Platelets: 17 10*3/uL — CL (ref 150–400)
RBC: 2.32 MIL/uL — ABNORMAL LOW (ref 3.87–5.11)
RDW: 18.3 % — ABNORMAL HIGH (ref 11.5–15.5)

## 2013-04-26 LAB — MAGNESIUM: Magnesium: 1.7 mg/dL (ref 1.5–2.5)

## 2013-04-26 LAB — BASIC METABOLIC PANEL
CO2: 24 mEq/L (ref 19–32)
Chloride: 105 mEq/L (ref 96–112)
Creatinine, Ser: 0.91 mg/dL (ref 0.50–1.10)
GFR calc Af Amer: 71 mL/min — ABNORMAL LOW (ref >90)
GFR calc non Af Amer: 62 mL/min — ABNORMAL LOW (ref >90)
Potassium: 3.3 mEq/L — ABNORMAL LOW (ref 3.5–5.1)
Sodium: 137 mEq/L (ref 135–145)

## 2013-04-26 LAB — PHOSPHORUS: Phosphorus: 2.1 mg/dL — ABNORMAL LOW (ref 2.3–4.6)

## 2013-04-26 LAB — GLUCOSE, CAPILLARY
Glucose-Capillary: 111 mg/dL — ABNORMAL HIGH (ref 70–99)
Glucose-Capillary: 97 mg/dL (ref 70–99)
Glucose-Capillary: 86 mg/dL (ref 70–99)

## 2013-04-26 MED ORDER — DEXTROSE 5 % IV SOLN
500.0000 mg | Freq: Three times a day (TID) | INTRAVENOUS | Status: DC
  Administered 2013-04-26 – 2013-04-29 (×10): 500 mg via INTRAVENOUS
  Filled 2013-04-26 (×11): qty 10

## 2013-04-26 MED ORDER — POTASSIUM CHLORIDE IN NACL 20-0.9 MEQ/L-% IV SOLN
INTRAVENOUS | Status: DC
  Administered 2013-04-26 (×2): via INTRAVENOUS
  Filled 2013-04-26 (×4): qty 1000

## 2013-04-26 MED ORDER — ENSURE COMPLETE PO LIQD
237.0000 mL | Freq: Three times a day (TID) | ORAL | Status: DC
  Administered 2013-04-26 – 2013-04-28 (×6): 237 mL via ORAL

## 2013-04-26 MED ORDER — POTASSIUM PHOSPHATE DIBASIC 3 MMOLE/ML IV SOLN
10.0000 mmol | Freq: Once | INTRAVENOUS | Status: AC
  Administered 2013-04-26: 10 mmol via INTRAVENOUS
  Filled 2013-04-26: qty 3.33

## 2013-04-26 MED ORDER — BIOTENE DRY MOUTH MT LIQD
15.0000 mL | Freq: Two times a day (BID) | OROMUCOSAL | Status: DC
  Administered 2013-04-27 – 2013-04-29 (×3): 15 mL via OROMUCOSAL

## 2013-04-26 MED ORDER — BIOTENE DRY MOUTH MT LIQD
15.0000 mL | Freq: Two times a day (BID) | OROMUCOSAL | Status: DC
Start: 2013-04-27 — End: 2013-04-29
  Administered 2013-04-27 – 2013-04-29 (×4): 15 mL via OROMUCOSAL

## 2013-04-26 MED ORDER — ACETAMINOPHEN 325 MG PO TABS: 650.0000 mg | ORAL_TABLET | ORAL | Status: DC | PRN

## 2013-04-26 MED ORDER — ACETAMINOPHEN 650 MG RE SUPP
650.0000 mg | RECTAL | Status: DC | PRN
  Administered 2013-04-26: 650 mg via RECTAL
  Filled 2013-04-26: qty 1

## 2013-04-26 NOTE — Progress Notes (Signed)
[facility] PROGRESS NOTE  Tonya Wise ZOX:[id number] DOB: 04/09/1941 DOA: 04/25/2013 PCP: [name], MD  Brief narrative: Tonya Wise is an 72 y.o. female Tonya Wise is an 72 y.o. female with a PMH of recently diagnosed NHL who was admitted on 04/10/2013 with dehydration, acute renal failure, hypercalcemia and rigors. She had a prolonged hospital course complicated by the development of lethargy and confusion. She underwent her first cycle of chemotherapy on 04/18/2013 after placement of a Port-A-Cath. She was slow to progress secondary to deconditioning, anorexia, and oral thrush/HSV with poor oral intake, and it was recommended that she be discharged to a rehabilitation facility however she opted to return home with home health physical therapy. She was alert and felt well at the time of discharge, but within 24 hours developed worsening lethargy, weakness and failure to thrive prompting her husband to bring her back to the hospital. Upon initial evaluation, she had pancytopenia and evidence of extensive herpetic lesions about the mouth. She also developed fever after presentation.  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 First cycle of R-CHOP done 04/18/2013.  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 On high-dose acyclovir as well as empiric vancomycin/Merropenem given neutropenic fever. -Unable to safely perform LP secondary to significant thrombocytopenia.  -Followup blood cultures x2, urinalysis negative for nitrites and leukocytes.  -Put on Neupogen starting 04/25/2013 after discussing the case with the oncologist on call last evening.  Active Problems:   Hypophosphatemia -Replete. Hypomagnesemia -Repleted. Hypokalemia -Replete. Diabetes  -CBG is 137-284.  Will continue 20 units of Levemir daily and SSI, moderate scale q. a.c./at bedtime.  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 Was discharged home on therapy with Valtrex.  -Followup CMV serologies and oral viral culture.  -Currently on high-dose acyclovir but if CMV test positive, we'll need to change antiviral to Gancyclovir.  Anorexia / severe protein calorie malnutrition  -Seen by dietitian on 04/13/2013.  -Could consider TNA if oral lesions don't begin to heal soon. Grade I diastolic dysfunction  -No current evidence of decompensated heart failure.  Chronic cough  -Chest x-ray negative for pneumonia. Continue Delsym.  Microcytic anemia, chronic  -S/P normal EGD on 03/29/13. BX negative for celiac sprue.  -Likely anemia of chronic disease related to malignancy.  -Status post 1 unit of packed red blood cells 04/15/2013, and 2 additional units 04/19/2013.  -Getting a unit of packed red blood cells today. Elevated LFTs / hepatic steatosis  -Hepatic steatosis noted on CT abdomen/pelvis done 03/30/13.  Code Status: DNR  Family Communication: Husband updated at bedside 04/25/2013.  Disposition Plan: Likely will need SNF for rehab.  Medical Consultants:  Dr. Rosie Fate, Oncology  Other Consultants:  None.  Anti-infectives:  Acyclovir 04/25/2013--->  Vancomycin 04/25/2013--->  Meropenem 04/25/2013--->  HPI/Subjective: Tonya Wise is weak but arouses to voice. She continues to have a cough and significant oral soreness. No nausea or vomiting. Denies dyspnea.  Objective: Filed Vitals:   04/26/13 1040 04/26/13 1125 04/26/13 1130 04/26/13 1159  BP:  125/62 125/62 119/70  Pulse:  94 105 94  Temp: 98.3 F (36.8 C) 97.6 F (36.4 C) 97.6  F (36.4 C) 98.1 F (  36.7 C)  TempSrc: Axillary Axillary Oral Oral  Resp:  18 20 16   SpO2:  96% 94% 94%    Intake/Output Summary (Last 24 hours) at 04/26/13 1209 Last data filed at 04/26/13 0610  Gross per 24 hour  Intake   2685 ml  Output   1355 ml  Net   1330 ml    Exam: Gen:  Lethargic Cardiovascular:  Tachycardic, regular, no murmurs, rubs, or gallops Respiratory:  Lung sounds diminished Gastrointestinal:  Abdomen soft, NT/ND, + BS Extremities:  4+ edema  Data Reviewed: Basic Metabolic Panel:  Recent Labs Lab 04/20/13 0410 04/21/13 0430 04/22/13 0530 04/23/13 0500 04/25/13 1410 04/26/13 0610  NA 134* 135 137 133* 134* 137  K 3.9 4.2 4.1 4.0 3.5 3.3*  CL 102 103 105 103 102 105  CO2 20 20 22 22 23 24   GLUCOSE 302* 315* 303* 248* 135* 112*  BUN 42* 52* 52* 42* 23 17  CREATININE 1.69* 1.79* 1.60* 1.30* 1.00 0.91  CALCIUM 7.7* 7.6* 7.6* 8.0* 7.7* 7.1*  MG 1.6 1.7 1.6  --  1.4* 1.7  PHOS 2.8 3.4 3.3  --  2.0* 2.1*   GFR The CrCl is unknown because both a height and weight (above a minimum accepted value) are required for this calculation. Liver Function Tests:  Recent Labs Lab 04/20/13 0410 04/21/13 0430 04/22/13 0530 04/23/13 0500 04/25/13 1410  AST 44* 28 24 25  35  ALT 10 11 10 12 16   ALKPHOS 124* 121* 157* 121* 154*  BILITOT 1.1 1.0 1.1 1.2 1.0  PROT 4.6* 4.3* 4.1* 4.1* 4.0*  ALBUMIN 1.2* 1.2* 1.2* 1.3* 1.5*   Coagulation profile  Recent Labs Lab 04/25/13 1443  INR 1.40    CBC:  Recent Labs Lab 04/21/13 0430 04/22/13 0530 04/23/13 0500 04/25/13 1410 04/26/13 0610  WBC 22.6* 12.4* 6.5 0.7* 0.5*  NEUTROABS  --   --   --  0.4* 0.3*  HGB 8.8* 7.9* 8.2* 6.9* 6.2*  HCT 26.6* 24.6* 25.5* 21.1* 18.8*  MCV 80.1 80.1 81.0 81.5 81.0  PLT 71* 56* 53* 15* 17*   BNP (last 3 results)  Recent Labs  04/22/13 0530  PROBNP 16301.0*   CBG:  Recent Labs Lab 04/24/13 0818 04/24/13 1140 04/25/13 1659 04/25/13 2134 04/26/13 0751  GLUCAP  214* 284* 137* 169* 111*     Procedures and Diagnostic Studies: Portable Chest 1 View  04/25/2013   CLINICAL DATA:  Chest pain. Weakness.  EXAM: PORTABLE CHEST - 1 VIEW  COMPARISON:  04/15/2013  FINDINGS: Lung base opacity seen on the prior study has improved most evident on the right. Some opacity persists that is likely scarring, atelectasis or a combination. A component of pneumonia is possible.  The lungs are otherwise clear.  No pleural effusion or pneumothorax.  The heart, mediastinum and hila are unremarkable.  New right anterior chest wall power Port-A-Cath has its tip in the lower superior vena cava.  IMPRESSION: Improved lung base opacities. This may be improved atelectasis, infiltrate or a combination. No new lung opacities.  New right anterior chest wall power Port-A-Cath is well positioned. No pneumothorax.   Electronically Signed   By: Amie Portland   On: 04/25/2013 14:34     Scheduled Meds: . acyclovir  500 mg Intravenous Q12H  . allopurinol  300 mg Oral Daily  . aspirin  81 mg Oral Daily  . bisoprolol  10 mg Oral Daily  . dextromethorphan  60 mg Oral BID  . filgrastim (NEUPOGEN)  SQ  480 mcg Subcutaneous q1800  . insulin aspart  0-15 Units Subcutaneous TID WC  . insulin aspart  0-5 Units Subcutaneous QHS  . insulin detemir  20 Units Subcutaneous QHS  . meropenem (MERREM) IV  1 g Intravenous Q8H  . pantoprazole  40 mg Oral Daily  . potassium phosphate IVPB (mmol)  10 mmol Intravenous Once  . protein supplement  1 scoop Oral TID WC  . vancomycin  750 mg Intravenous Q12H   Continuous Infusions: . 0.9 % NaCl with KCl 20 mEq / L 100 mL/hr at 04/26/13 1006    Time spent: 35 minutes with > 50% of time discussing current diagnostic test results, clinical impression and plan of care.    LOS: 1 day   Lionel Woodberry  [facility] Pager [phone].   *Please note that the hospitalists switch teams on Wednesdays. Please call the flow manager at [phone] if you are having  difficulty reaching the hospitalist taking care of this patient as she can update you and provide the most up-to-date pager number of provider caring for the patient. If 8PM-8AM, please contact night-coverage at [email or web address], password [facility]  04/26/2013, 12:09 PM

## 2013-04-26 NOTE — Consult Note (Signed)
Tonya Wise   DOB:1941-05-29   ZO#:[id number]   JWJ#:[id number]  Subjective:  This morning her husband [name] and her daughter Geraldine Contras was at bedside.  I also spoke to the hospitalist team and the nurse who reports substantial improvement in her mouth sores since starting intravenous acyclovir.  They report that the patient had poor oral intake being discharged on Friday evening. In addition the patient also had worsening of her mental status and unable to identify some of her relatives.  She was able to recognize me he went to sleep shortly thereafter.   Objective:  Filed Vitals:   04/26/13 0607  BP:   Pulse:   Temp: 101.6 F (38.7 C)  Resp:     There is no weight on file to calculate BMI.  Intake/Output Summary (Last 24 hours) at 04/26/13 1021 Last data filed at 04/26/13 0610  Gross per 24 hour  Intake   2685 ml  Output   1355 ml  Net   1330 ml               Edematous chronically ill appearing lady  Sclerae unicteric; No eye lesions.   Oropharynx with dark lesion on upper lip with crusting on lower lip; Dry oral mucosal.    No peripheral adenopathy  Lungs clear -- decreased breath sounds at the bases  Heart regular rate and rhythm  Abdomen; S/NT + BS  MSK no focal spinal tenderness, 2-3+ pitting peripheral edema  Neuro nonfocal; moves all extremities;  Alert to self; drowsy and unable to cooperate with remainder of exam.     CBG (last 3)   Recent Labs  04/25/13 1659 04/25/13 2134 04/26/13 0751  GLUCAP 137* 169* 111*     Labs:  Lab Results  Component Value Date   WBC 0.5* 04/26/2013   HGB 6.2* 04/26/2013   HCT 18.8* 04/26/2013   MCV 81.0 04/26/2013   PLT 17* 04/26/2013   NEUTROABS 0.3* 04/26/2013   Basic Metabolic Panel:  Recent Labs Lab 04/20/13 0410 04/21/13 0430 04/22/13 0530 04/23/13 0500 04/25/13 1410 04/26/13 0610  NA 134* 135 137 133* 134* 137  K 3.9 4.2 4.1 4.0 3.5 3.3*  CL 102 103 105 103 102 105  CO2 20 20 22 22 23 24   GLUCOSE 302* 315* 303* 248* 135*  112*  BUN 42* 52* 52* 42* 23 17  CREATININE 1.69* 1.79* 1.60* 1.30* 1.00 0.91  CALCIUM 7.7* 7.6* 7.6* 8.0* 7.7* 7.1*  MG 1.6 1.7 1.6  --  1.4* 1.7  PHOS 2.8 3.4 3.3  --  2.0* 2.1*   GFR The CrCl is unknown because both a height and weight (above a minimum accepted value) are required for this calculation. Liver Function Tests:  Recent Labs Lab 04/20/13 0410 04/21/13 0430 04/22/13 0530 04/23/13 0500 04/25/13 1410  AST 44* 28 24 25  35  ALT 10 11 10 12 16   ALKPHOS 124* 121* 157* 121* 154*  BILITOT 1.1 1.0 1.1 1.2 1.0  PROT 4.6* 4.3* 4.1* 4.1* 4.0*  ALBUMIN 1.2* 1.2* 1.2* 1.3* 1.5*   Coagulation profile  Recent Labs Lab 04/25/13 1443  INR 1.40    CBC:  Recent Labs Lab 04/21/13 0430 04/22/13 0530 04/23/13 0500 04/25/13 1410 04/26/13 0610  WBC 22.6* 12.4* 6.5 0.7* 0.5*  NEUTROABS  --   --   --  0.4* 0.3*  HGB 8.8* 7.9* 8.2* 6.9* 6.2*  HCT 26.6* 24.6* 25.5* 21.1* 18.8*  MCV 80.1 80.1 81.0 81.5 81.0  PLT 71* 56* 53*  15* 17*     Recent Labs Lab 04/24/13 0818 04/24/13 1140 05-11-2013 1659 05/11/2013 2134 04/26/13 0751  GLUCAP 214* 284* 137* 169* 111*    Studies:  Portable Chest 1 View  11-May-2013   CLINICAL DATA:  Chest pain. Weakness.  EXAM: PORTABLE CHEST - 1 VIEW  COMPARISON:  04/15/2013  FINDINGS: Lung base opacity seen on the prior study has improved most evident on the right. Some opacity persists that is likely scarring, atelectasis or a combination. A component of pneumonia is possible.  The lungs are otherwise clear.  No pleural effusion or pneumothorax.  The heart, mediastinum and hila are unremarkable.  New right anterior chest wall power Port-A-Cath has its tip in the lower superior vena cava.  IMPRESSION: Improved lung base opacities. This may be improved atelectasis, infiltrate or a combination. No new lung opacities.  New right anterior chest wall power Port-A-Cath is well positioned. No pneumothorax.   Electronically Signed   By: Amie Portland   On:  05/11/2013 14:34    Assessment/Plan: 72 y.o. female with a history of diffuse large B cell lymphoma, high grade (stage IV) with these symptoms status post R-C HOP on 04/18/2013. She had a prior hospitalization from September 12 from September of 26 2014 secondary to hypercalcemia, renal failure and dehydration. During this period, the patient's clinical course was complicated by pneumonia and she was treated with intravenous antibiotics and the development of lethargy and confusion which resolved over the clinical course. She was readmitted from home on yesterday 05-11-2013 for worsening mouth sores/oral thrush, odynophagia, failure to thrive. Patient was started on high-dose acyclovir for empiric herpetic encephalitis. He also had developed worsening weakness unable unable to ambulate greater than 5 feet without 2+ assist.    1. DLBCL (Stage IV) s/p RminiCHOP (09/20) complicated by the following (highlighted). --Her International Prognostic index is 4 (NEJM, 1993) with a 3-year survival rate of 59% based on an elevated LDH, age greater than 58 years old, performance status of 2, Stage IV. She has extranodal involvement in the spleen and in the bone marrow.  --She received R-miniCHOP based on  04/18/2013. It consisted of  375 mg/m(2) rituximab,400 mg/m(2) cyclophosphamide  25 mg/m(2) doxorubicin,1 mg vincristine , Days #1-5 40 mg/m(2) prednisone.    --Chemo-induce pancytopenia/neutropenic fever (Tmax 101.6). She was started neupogen 480 mcg daily on 07/19/2013 with rapid leukocytosis. It was held and on discharge her white count was over 6500 on day of discharge 04/24/2013. On her day of readmission (05/11/2013), her white count was 0.7.        -Please resume Neupogen 480 mcg daily until count recovery.         -Please transfuse packed RBCs to keep the hemoglobin greater than 8.         -Please continue broad spectrium antibiotics and antifungal medication in the setting of febrile neutropenia.    --Mouth sore/? HSV mucocutaneous oral/Thrush.  I agree with high-dose acyclovir empirically. Please swab mouth lesions for further characterization as it will assist in treatment duration.   Await CMV serologies and oral viral culture.   --Supportive therapy. Zofran 8 mg bid prn when necessary for nausea and vomiting  --AMS/Confusion/? HSV encephalitis.  I agree with the need to do a lumbar puncture to assess for CNS involvement or HSV encephalitis and/or diffuse large B-cell lymphoma setting her immunosuppressed state; however, her platelets remain 17,000 today.  CNS prophylaxis will be considered later with systemic methotrexate (3-3.5 g/m^2) or intrathecal methotrexate and/or cytarabine  for 4- 8 doses based on the presence of DLBCL but this will be balanced on risks from benefits (her main risk against MTX includes her excessive fluid status).  I agree with following the blood cultures.   --Anorexia/FTT/Poor nutrition secondary to above.  Her albumin level improved on discharge to 1.5 up from 1.2 on admission (04/10/2013). I think if we can improve her oral intake in light of her probable HSV, we encourage increased by mouth intake. I agree with consult to nutrition for consideration of long-term plan regarding her dietary intake in the setting of her mouth sores. I am not sure if TPN in this setting is warranted presently.    We will follow this patient with you.   Myra Rude, MD Medical Oncology [phone] 04/26/2013  10:21 AM

## 2013-04-26 NOTE — Progress Notes (Signed)
Pt developed a fever this shift.  Despite tylenol given pt's fever did not come down so 1 unit of PRBC's were held until fever could be controlled.

## 2013-04-27 DIAGNOSIS — D709 Neutropenia, unspecified: Secondary | ICD-10-CM

## 2013-04-27 DIAGNOSIS — R5081 Fever presenting with conditions classified elsewhere: Secondary | ICD-10-CM | POA: Diagnosis not present

## 2013-04-27 LAB — BASIC METABOLIC PANEL
BUN: 15 mg/dL (ref 6–23)
Calcium: 7 mg/dL — ABNORMAL LOW (ref 8.4–10.5)
GFR calc Af Amer: 70 mL/min — ABNORMAL LOW (ref >90)
GFR calc non Af Amer: 61 mL/min — ABNORMAL LOW (ref >90)
Glucose, Bld: 69 mg/dL — ABNORMAL LOW (ref 70–99)
Sodium: 138 mEq/L (ref 135–145)

## 2013-04-27 LAB — CBC WITH DIFFERENTIAL/PLATELET
Basophils Absolute: 0 10*3/uL (ref 0.0–0.1)
Eosinophils Absolute: 0 10*3/uL (ref 0.0–0.7)
Lymphocytes Relative: 54 % — ABNORMAL HIGH (ref 12–46)
MCHC: 33.3 g/dL (ref 30.0–36.0)
Monocytes Absolute: 0.1 10*3/uL (ref 0.1–1.0)
Neutro Abs: 0.1 10*3/uL — ABNORMAL LOW (ref 1.7–7.7)
Neutrophils Relative %: 25 % — ABNORMAL LOW (ref 43–77)
Platelets: 32 10*3/uL — ABNORMAL LOW (ref 150–400)
RDW: 17.6 % — ABNORMAL HIGH (ref 11.5–15.5)
WBC: 0.4 10*3/uL — CL (ref 4.0–10.5)

## 2013-04-27 LAB — MAGNESIUM: Magnesium: 1.6 mg/dL (ref 1.5–2.5)

## 2013-04-27 LAB — TYPE AND SCREEN
ABO/RH(D): B POS
Antibody Screen: NEGATIVE

## 2013-04-27 LAB — GLUCOSE, CAPILLARY
Glucose-Capillary: 80 mg/dL (ref 70–99)
Glucose-Capillary: 99 mg/dL (ref 70–99)
Glucose-Capillary: 103 mg/dL — ABNORMAL HIGH (ref 70–99)
Glucose-Capillary: 98 mg/dL (ref 70–99)

## 2013-04-27 MED ORDER — POTASSIUM PHOSPHATE DIBASIC 3 MMOLE/ML IV SOLN
20.0000 mmol | Freq: Once | INTRAVENOUS | Status: AC
  Administered 2013-04-27: 20 mmol via INTRAVENOUS
  Filled 2013-04-27: qty 6.67

## 2013-04-27 MED ORDER — ADULT MULTIVITAMIN W/MINERALS CH
1.0000 | ORAL_TABLET | Freq: Every day | ORAL | Status: DC
  Administered 2013-04-27 – 2013-04-29 (×3): 1 via ORAL
  Filled 2013-04-27 (×3): qty 1

## 2013-04-27 MED ORDER — IPRATROPIUM BROMIDE 0.02 % IN SOLN
RESPIRATORY_TRACT | Status: AC
  Filled 2013-04-27: qty 2.5

## 2013-04-27 MED ORDER — FUROSEMIDE 10 MG/ML IJ SOLN
40.0000 mg | Freq: Once | INTRAMUSCULAR | Status: AC
  Administered 2013-04-27: 40 mg via INTRAVENOUS
  Filled 2013-04-27: qty 4

## 2013-04-27 MED ORDER — IPRATROPIUM BROMIDE 0.02 % IN SOLN
RESPIRATORY_TRACT | Status: AC
  Administered 2013-04-27: 0.5 mg
  Filled 2013-04-27: qty 2.5

## 2013-04-27 MED ORDER — FLUCONAZOLE IN SODIUM CHLORIDE 200-0.9 MG/100ML-% IV SOLN
200.0000 mg | INTRAVENOUS | Status: DC
  Administered 2013-04-27 – 2013-04-29 (×3): 200 mg via INTRAVENOUS
  Filled 2013-04-27 (×3): qty 100

## 2013-04-27 MED ORDER — IPRATROPIUM BROMIDE 0.02 % IN SOLN: 0.5000 mg | RESPIRATORY_TRACT | Status: DC | PRN

## 2013-04-27 MED ORDER — INSULIN DETEMIR 100 UNIT/ML ~~LOC~~ SOLN
10.0000 [IU] | Freq: Every day | SUBCUTANEOUS | Status: DC
  Administered 2013-04-27: 10 [IU] via SUBCUTANEOUS
  Filled 2013-04-27: qty 0.1

## 2013-04-27 MED ORDER — ALBUTEROL SULFATE (5 MG/ML) 0.5% IN NEBU
INHALATION_SOLUTION | RESPIRATORY_TRACT | Status: AC
  Administered 2013-04-27: 2.5 mg
  Filled 2013-04-27: qty 0.5

## 2013-04-27 MED ORDER — ALBUTEROL SULFATE (5 MG/ML) 0.5% IN NEBU: 2.5000 mg | INHALATION_SOLUTION | RESPIRATORY_TRACT | Status: DC | PRN

## 2013-04-27 MED ORDER — POTASSIUM CHLORIDE IN NACL 40-0.9 MEQ/L-% IV SOLN
INTRAVENOUS | Status: DC
  Administered 2013-04-27: 09:00:00 via INTRAVENOUS
  Administered 2013-04-28: 20 mL/h via INTRAVENOUS
  Filled 2013-04-27 (×4): qty 1000

## 2013-04-27 NOTE — Evaluation (Signed)
Occupational Therapy Evaluation Patient Details Name: ZYAN MIRKIN MRN: [id number] DOB: Jul 14, 1941 Today's Date: 04/27/2013 Time: 1478-2956 OT Time Calculation (min): 30 min  OT Assessment / Plan / Recommendation History of present illness ALFIE ALDERFER is an 72 y.o. female with a PMH of recently diagnosed NHL who was admitted on 04/10/2013 with dehydration, acute renal failure, hypercalcemia and rigors. She had a prolonged hospital course complicated by the development of lethargy and confusion. She underwent her first cycle of chemotherapy on 04/18/2013 after placement of a Port-A-Cath. She was slow to progress secondary to deconditioning, anorexia, and oral thrush/HSV with poor oral intake, and it was recommended that she be discharged to a rehabilitation facility however she opted to return home with home health physical therapy. She was alert and felt well at the time of discharge. Since discharge, the patient developed worsening weakness and her elderly husband is unable to manage her care safely in the home. She has been unable to stand or ambulate without 2+ assist and has not had any meaningful oral intake since discharge. Sores on her mouth seem to have gotten worse.   Clinical Impression   Pt fatigues easily w/ little activity noted during seated ADL's for grooming/self-care She will benefit from acute Rehab/OT to assist in areas of deficits (see problem list below) & maximizing independence prior to next venue. Pt/husband educated verbally in Role of OT and POC.    OT Assessment  Patient needs continued OT Services    Follow Up Recommendations  SNF    Barriers to Discharge      Equipment Recommendations  None recommended by OT    Recommendations for Other Services    Frequency  Min 2X/week    Precautions / Restrictions Precautions Precautions: Fall;Other (comment) (DNR, Contact precautions) Restrictions Weight Bearing Restrictions: No   Pertinent Vitals/Pain No, denies  pain. Fatigued, lethargic. Bilateral UE moderate edema noted. RN made aware.    ADL  Eating/Feeding: Performed;Set up;Supervision/safety Where Assessed - Eating/Feeding: Chair Grooming: Performed;Wash/dry hands;Wash/dry face;Teeth care;Min guard;Supervision/safety Where Assessed - Grooming: Supported sitting;Unsupported sitting Upper Body Bathing: Performed;Right arm;Left arm;Abdomen;Minimal assistance Where Assessed - Upper Body Bathing: Supported sitting;Unsupported sitting Lower Body Bathing: Simulated;+2 Total assistance Where Assessed - Lower Body Bathing: Supported sit to stand Upper Body Dressing: Performed;Minimal assistance Where Assessed - Upper Body Dressing: Unsupported sitting Lower Body Dressing: Simulated;+2 Total assistance Where Assessed - Lower Body Dressing: Supported sit to stand Toilet Transfer: Simulated;+2 Total assistance Toilet Transfer Method: Surveyor, minerals: Bedside commode Transfers/Ambulation Related to ADLs: Pt very weak and fatigued sitting up in chair to perform ADL's/grooming tasks, with audible wheezing noted on 3L O2 via nasal cannula throughout. Pt too fatigued for transfers/please refer to PT assessment at this time. TBA further as pt able to tolerate. ADL Comments: Pt performed ADL's and self care related to grooming, UB bathing and oral care today with assist. She fatigues easily w/ little activity noted. She will benefit from acure Rehab/OT to assist in areas of deficits. Pt/husband educated verbally in Role of OT and POC.    OT Diagnosis: Generalized weakness  OT Problem List: Decreased activity tolerance;Impaired balance (sitting and/or standing);Decreased knowledge of use of DME or AE;Cardiopulmonary status limiting activity;Increased edema (Audible wheezing w/ any activity) OT Treatment Interventions: Self-care/ADL training;Therapeutic exercise;Energy conservation;DME and/or AE instruction;Patient/family education;Therapeutic  activities;Balance training   OT Goals(Current goals can be found in the care plan section) Acute Rehab OT Goals Patient Stated Goal: None stated Time For Goal Achievement: 05/11/13  Potential to Achieve Goals: Good  Visit Information  Last OT Received On: 04/27/13 Assistance Needed: +1 History of Present Illness: DELANNA BLACKETER is an 72 y.o. female with a PMH of recently diagnosed NHL who was admitted on 04/10/2013 with dehydration, acute renal failure, hypercalcemia and rigors. She had a prolonged hospital course complicated by the development of lethargy and confusion. She underwent her first cycle of chemotherapy on 04/18/2013 after placement of a Port-A-Cath. She was slow to progress secondary to deconditioning, anorexia, and oral thrush/HSV with poor oral intake, and it was recommended that she be discharged to a rehabilitation facility however she opted to return home with home health physical therapy. She was alert and felt well at the time of discharge. Since discharge, the patient developed worsening weakness and her elderly husband is unable to manage her care safely in the home. She has been unable to stand or ambulate without 2+ assist and has not had any meaningful oral intake since discharge. Sores on her mouth seem to have gotten worse.       Prior Functioning     Home Living Family/patient expects to be discharged to:: Skilled nursing facility Living Arrangements: Spouse/significant other Available Help at Discharge: Family Type of Home: House Home Layout: Two level;Able to live on main level with bedroom/bathroom Alternate Level Stairs-Number of Steps: 2 Alternate Level Stairs-Rails: None Home Equipment: Walker - 2 wheels;Bedside commode;Wheelchair - Manufacturing systems engineer Prior Function Level of Independence: Needs assistance Gait / Transfers Assistance Needed: assist needed, pt had become very weak ADL's / Homemaking Assistance Needed: assist to get into shower and  bathe Communication Communication: No difficulties Dominant Hand: Right    Vision/Perception  NT   Cognition  Cognition Arousal/Alertness: Awake/alert;Lethargic Behavior During Therapy: WFL for tasks assessed/performed Overall Cognitive Status: Difficult to assess (Pt stated "[birth year]" when asked year of birth)    Extremity/Trunk Assessment Upper Extremity Assessment Upper Extremity Assessment: Generalized weakness (Moderate edema bilateral hands/UE's noted) Lower Extremity Assessment Lower Extremity Assessment: Defer to PT evaluation RLE Deficits / Details: edema in  both legs. pt is able to activate all muscle groups but has difficulty lifting legs due to weight from edema RLE Sensation: decreased light touch;decreased proprioception LLE Deficits / Details: as above LLE Sensation: decreased light touch;decreased proprioception Cervical / Trunk Assessment Cervical / Trunk Assessment: Normal    Mobility Bed Mobility Bed Mobility: Supine to Sit Supine to Sit: 3: Mod assist;HOB elevated Details for Bed Mobility Assistance: Mod assist to support B LE off bed with increased time Transfers Sit to Stand: From bed;From toilet;3: Mod assist;From chair/3-in-1 Stand to Sit: To chair/3-in-1;To toilet;4: Min assist Details for Transfer Assistance: VCs hand placement, SPT with RW x 2 from bed to [facility], BSC to recliner; sit to stand x 3     Exercise Other Exercises: Pt was educated verbally and via demonstration in AROM for edema control bilateral UE's (hands, elbows, wrists etc) as well as positioning in bed (elevation).   Balance Balance Balance Assessed: Yes Static Sitting Balance Static Sitting - Balance Support: No upper extremity supported;Feet supported Static Sitting - Level of Assistance: 5: Stand by assistance Static Sitting - Comment/# of Minutes: 3   End of Session OT - End of Session Activity Tolerance: Patient limited by fatigue;Patient limited by lethargy Patient left: in  chair;with call bell/phone within reach;with family/visitor present Nurse Communication: Other (comment) (Edema control, armbands tight.)  GO     Charletta Cousin, Amy Beth Dixon 04/27/2013, 11:42 AM

## 2013-04-27 NOTE — Progress Notes (Signed)
Went into patient's room to help move her from chair to bed with nurse tech's help. After we got patient back to bed her respiratory rate increased to 30. She was short of breath and had auditory wheezes heard from bedside without stethoscope. Oxygen with 2L was 89%. Increased her to 3L where oxygen level stablized. All other vitals were stable. Reported findings to Rama MD. Jovita Gamma atrovent and albuterol nebulizer treatment with her order. Pt's wheezing continued. Considering low output, total intake for day, and wet lung sounds paged night shift doctor for order of lasix. Respiratory therapist stopped by and agreed no further nebulizer treatments were needed at time. Reported all of this to night shift nurse, and administered lasix before leaving.

## 2013-04-27 NOTE — Progress Notes (Signed)
[facility]  Patient Status: Active (receiving services up to time of hospitalization)  [facility] is providing the following services: RN and PT  If patient discharges after hours, please call [phone].   Lanae Crumbly 04/27/2013, 3:30 PM

## 2013-04-27 NOTE — Progress Notes (Signed)
[facility] PROGRESS NOTE  Tonya Wise ZOX:[id number] DOB: 10/27/1940 DOA: 04/25/2013 PCP: [name], MD  Brief narrative: Tonya Wise is an 72 y.o. female Tonya Wise is an 72 y.o. female with a PMH of recently diagnosed NHL who was admitted on 04/10/2013 with dehydration, acute renal failure, hypercalcemia and rigors. She had a prolonged hospital course complicated by the development of lethargy and confusion. She underwent her first cycle of chemotherapy on 04/18/2013 after placement of a Port-A-Cath. She was slow to progress secondary to deconditioning, anorexia, and oral thrush/HSV with poor oral intake, and it was recommended that she be discharged to a rehabilitation facility however she opted to return home with home health physical therapy. She was alert and felt well at the time of discharge, but within 24 hours developed worsening lethargy, weakness and failure to thrive prompting her husband to bring her back to the hospital. Upon initial evaluation, she had pancytopenia and evidence of extensive herpetic lesions about the mouth. She also developed fever after presentation.  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 First cycle of R-CHOP done 04/18/2013.  -Given immunosuppression and neutropenic fever, she is being treated with high-dose acyclovir as well as empiric vancomycin/Merropenem. Add Diflucan for antifungal coverage. -Unable to safely perform LP secondary to significant thrombocytopenia.  -Blood cultures negative to date, urinalysis negative for nitrites and leukocytes.  -Continue Neupogen, which was started on 04/25/2013. No recovery of WBC yet. Active Problems:  Hypophosphatemia -Replete. Hypomagnesemia -Repleted. Hypokalemia -Replete. Diabetes  -CBG is 80-148.  Will  decrease Levemir to 10 units, continue SSI, moderate scale q. a.c./at bedtime.  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 Was discharged home on therapy with Valtrex.  -Followup CMV serologies and oral viral culture.  -Currently on high-dose acyclovir but if CMV test positive, we'll need to change antiviral to Gancyclovir.  Anorexia / severe protein calorie malnutrition  -Seen by dietitian on 04/13/2013.  -Could consider feeding tube if oral lesions don't begin to heal soon.  Discussed this with her as a possible option, but she wants to defer for now. Grade I diastolic dysfunction  -No current evidence of decompensated heart failure.  Chronic cough  -Chest x-ray negative for pneumonia. Continue Delsym.  Microcytic anemia, chronic  -S/P normal EGD on 03/29/13. BX negative for celiac sprue.  -Likely anemia of chronic disease related to malignancy.  -Given one unit of blood on 04/26/2013 with post transfusion hemoglobin of 8.7 mg/dL. Elevated LFTs / hepatic steatosis  -Hepatic steatosis noted on CT abdomen/pelvis done 03/30/13.  Code Status: DNR  Family Communication: Husband updated at bedside 04/25/2013.  Disposition Plan: Likely will need SNF for rehab.  Medical Consultants:  Dr. Rosie Fate, Oncology  Other Consultants:  None.  Anti-infectives:  Acyclovir 04/25/2013--->  Vancomycin 04/25/2013--->  Meropenem 04/25/2013--->  Diflucan 04/27/2013--->  HPI/Subjective: Tonya Wise is weak but more alert today.  She is sitting up in the chair.  No improvement of oral soreness. No nausea or vomiting. Denies dyspnea.  Objective: Filed Vitals:   04/26/13 1358 04/26/13 1518 04/26/13 2038 04/27/13 0531  BP: 124/73 134/66 131/75 141/91  Pulse: 94  108 122  Temp: 98.1 F (36.7 C)  99.6 F (37.6 C) 98.4 F (36.9 C)  TempSrc: Oral  Axillary  Axillary  Resp: 16  16 20   Height:      Weight:      SpO2: 93%  90% 96%    Intake/Output Summary (Last 24 hours) at 04/27/13 0758 Last data filed at 04/27/13 0530  Gross per 24 hour  Intake 2193.33 ml  Output   1105 ml  Net 1088.33 ml    Exam: Gen:  More awake and alert Cardiovascular:  Tachycardic, regular, no murmurs, rubs, or gallops Respiratory:  Lung sounds diminished Gastrointestinal:  Abdomen soft, NT/ND, + BS Extremities:  4+ edema  Data Reviewed:  Basic Metabolic Panel:  Recent Labs Lab 04/21/13 0430 04/22/13 0530 04/23/13 0500 04/25/13 1410 04/26/13 0610 04/27/13 0545  NA 135 137 133* 134* 137 138  K 4.2 4.1 4.0 3.5 3.3* 3.3*  CL 103 105 103 102 105 105  CO2 20 22 22 23 24 23   GLUCOSE 315* 303* 248* 135* 112* 69*  BUN 52* 52* 42* 23 17 15   CREATININE 1.79* 1.60* 1.30* 1.00 0.91 0.92  CALCIUM 7.6* 7.6* 8.0* 7.7* 7.1* 7.0*  MG 1.7 1.6  --  1.4* 1.7 1.6  PHOS 3.4 3.3  --  2.0* 2.1* 1.8*   GFR Estimated Creatinine Clearance: 55.2 ml/min (by C-G formula based on Cr of 0.92). Liver Function Tests:  Recent Labs Lab 04/21/13 0430 04/22/13 0530 04/23/13 0500 04/25/13 1410  AST 28 24 25  35  ALT 11 10 12 16   ALKPHOS 121* 157* 121* 154*  BILITOT 1.0 1.1 1.2 1.0  PROT 4.3* 4.1* 4.1* 4.0*  ALBUMIN 1.2* 1.2* 1.3* 1.5*   Coagulation profile  Recent Labs Lab 04/25/13 1443  INR 1.40    CBC:  Recent Labs Lab 04/22/13 0530 04/23/13 0500 04/25/13 1410 04/26/13 0610 04/27/13 0545  WBC 12.4* 6.5 0.7* 0.5* 0.4*  NEUTROABS  --   --  0.4* 0.3* 0.1*  HGB 7.9* 8.2* 6.9* 6.2* 8.9*  HCT 24.6* 25.5* 21.1* 18.8* 26.7*  MCV 80.1 81.0 81.5 81.0 79.2  PLT 56* 53* 15* 17* 32*   CBG:  Recent Labs Lab 04/26/13 0751 04/26/13 1224 04/26/13 1711 04/26/13 2207 04/27/13 0737  GLUCAP 111* 97 148* 86 80   Microbiology Recent Results (from the past 240 hour(s))  CULTURE, BLOOD (ROUTINE X 2)     Status: None   Collection Time    04/25/13  1:57 PM       Result Value Range Status   Specimen Description PORTA CATH LINE, CENTRA   Final   Special Requests BOTTLES DRAWN AEROBIC AND ANAEROBIC 5CC EACH   Final   Culture  Setup Time     Final   Value: 04/25/2013 21:22     Performed at [facility]   Culture     Final   Value:        BLOOD CULTURE RECEIVED NO GROWTH TO DATE CULTURE WILL BE HELD FOR 5 DAYS BEFORE ISSUING A FINAL NEGATIVE REPORT     Performed at [facility]   Report Status PENDING   Incomplete  CULTURE, BLOOD (ROUTINE X 2)     Status: None   Collection Time    04/25/13  2:25 PM      Result Value Range Status   Specimen Description BLOOD LEFT ARM   Final   Special Requests BOTTLES DRAWN AEROBIC AND ANAEROBIC 4.5CC EACH   Final   Culture  Setup Time     Final   Value: 04/25/2013 21:21     Performed at [facility]   Culture     Final   Value:        BLOOD CULTURE RECEIVED NO GROWTH TO DATE CULTURE WILL BE HELD FOR 5 DAYS BEFORE ISSUING A FINAL NEGATIVE REPORT     Performed at [facility]   Report Status PENDING   Incomplete      Procedures and Diagnostic Studies: Portable Chest 1 View  04/25/2013   CLINICAL DATA:  Chest pain. Weakness.  EXAM: PORTABLE CHEST - 1 VIEW  COMPARISON:  04/15/2013  FINDINGS: Lung base opacity seen on the prior study has improved most evident on the right. Some opacity persists that is likely scarring, atelectasis or a combination. A component of pneumonia is possible.  The lungs are otherwise clear.  No pleural effusion or pneumothorax.  The heart, mediastinum and hila are unremarkable.  New right anterior chest wall power Port-A-Cath has its tip in the lower superior vena cava.  IMPRESSION: Improved lung base opacities. This may be improved atelectasis, infiltrate or a combination. No new lung opacities.  New right anterior chest wall power Port-A-Cath is well positioned. No pneumothorax.   Electronically Signed   By: Amie Portland   On: 04/25/2013 14:34     Scheduled  Meds: . acyclovir  500 mg Intravenous Q8H  . allopurinol  300 mg Oral Daily  . antiseptic oral rinse  15 mL Mouth Rinse q12n4p  . antiseptic oral rinse  15 mL Mouth Rinse BID  . aspirin  81 mg Oral Daily  . bisoprolol  10 mg Oral Daily  . dextromethorphan  60 mg Oral BID  . feeding supplement  237 mL Oral TID WC  . filgrastim (NEUPOGEN)  SQ  480 mcg Subcutaneous q1800  . insulin aspart  0-15 Units Subcutaneous TID WC  . insulin aspart  0-5 Units Subcutaneous QHS  . insulin detemir  20 Units Subcutaneous QHS  . meropenem (MERREM) IV  1 g Intravenous Q8H  . pantoprazole  40 mg Oral Daily  . protein supplement  1 scoop Oral TID WC  . vancomycin  750 mg Intravenous Q12H   Continuous Infusions: . 0.9 % NaCl with KCl 20 mEq / L 100 mL/hr at 04/26/13 2253    Time spent: 35 minutes with > 50% of time discussing current diagnostic test results, clinical impression and plan of care.    LOS: 2 days   Jakhi Dishman  [facility] Pager [phone].   *Please note that the hospitalists switch teams on Wednesdays. Please call the flow manager at [phone] if you are having difficulty reaching the hospitalist taking care of this patient as she can update you and provide the most up-to-date pager number of provider caring for the patient. If 8PM-8AM, please contact night-coverage at [email or web address], password [facility]  04/27/2013, 7:58 AM

## 2013-04-27 NOTE — Progress Notes (Signed)
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   Goal: Initiation of enteral nutrition with goal to meet >90% of estimated nutritional needs.   Monitor:  Weights, labs, intake, mouth sores, goals of care, TF initiation  Reason for Assessment: Nutrition risk   72 y.o. female  Admitting Dx: Neutropenic fever  ASSESSMENT: Pt discussed during multidisciplinary rounds.   Pt well known to RD from previous admission. Pt with history of diffuse large B cell lymphoma, high grade (stage IV) s/p R-C HOP on 04/18/2013. She had a prior hospitalization from September 12 from September of 26 2014 secondary to hypercalcemia, renal failure and dehydration. During this period, the patient's clinical course was complicated by pneumonia and she was treated with intravenous antibiotics and the development of lethargy and confusion which resolved over the clinical course. She was readmitted from home on 04/25/2013 for worsening mouth sores/oral thrush, odynophagia, failure to thrive.   Pt struggled with PO intake last hospitalization with pt unable to eat more than 25% of meal intake. Pt did not like any nutritional supplement other than Beneprotein powder. Pt gained 29 pounds of fluid and still has edema.   Pt alone in room and drowsy, unable to obtain recent dietary history. Per H&P, MD noted pt with poor oral  intake since d/c 04/24/13 with worsening mouth sores. TPN had been considered, however recommended enteral nutrition during rounds as this would be more appropriate r/t pt with functioning gut. Also recommended palliative care consult during rounds and hospitalist said she would discuss with oncologist.   Pt with low potassium and phosphorus, magnesium WNL. If nutrition support initiated, pt at high refeeding risk and these labs will need to be closely monitored and repleted.   Height: Ht Readings from Last 1 Encounters:  04/26/13 5' 1.81" (1.57 m)    Weight: Wt Readings from Last 1 Encounters:  04/26/13 184 lb 4.9 oz (83.6 kg)    Ideal Body Weight: 110 lb   % Ideal Body Weight: 167%  Wt Readings from Last 10 Encounters:  04/26/13 184 lb 4.9 oz (83.6 kg)  04/23/13 184 lb 6.4 oz (83.643 kg)  04/06/13 155 lb 9.6 oz (70.58 kg)  04/01/13 162 lb 7.7 oz (73.7 kg)  04/01/13 162 lb 7.7 oz (73.7 kg)  04/01/13 162 lb 7.7 oz (73.7 kg)  03/26/13 157 lb (71.215 kg)  03/12/13 166 lb 3.2 oz (75.388 kg)  02/23/13 166 lb (75.297 kg)  12/31/12 171 lb (77.565 kg)    Usual Body Weight: 175 lb   % Usual Body Weight: 105%  BMI:  Body mass index is 33.92 kg/(m^2). Not accurate BMI r/t pt with +2 and +3 edema  Estimated Nutritional Needs: Kcal: 1500-1750 Protein: 60-75g Fluid: 1.5-1.7L/day  Skin: +2 generalized edema, +2 RUE, LUE edema, +3 RLE, LLE edema   Diet Order: Dysphagia 3, thin  EDUCATION NEEDS: -No education needs identified at this time   Intake/Output Summary (Last 24 hours) at 04/27/13 1251  Last data filed at 04/27/13 0530  Gross per 24 hour  Intake   1535 ml  Output   1105 ml  Net    430 ml    Last BM: 9/25   Labs:   Recent Labs Lab 04/25/13 1410 04/26/13 0610 04/27/13 0545  NA 134* 137 138  K 3.5 3.3* 3.3*  CL 102 105 105  CO2 23 24 23   BUN 23 17 15   CREATININE 1.00 0.91 0.92  CALCIUM 7.7* 7.1* 7.0*  MG 1.4* 1.7 1.6  PHOS 2.0* 2.1* 1.8*  GLUCOSE 135*  112* 69*    CBG (last 3)   Recent Labs  04/26/13 1711 04/26/13 2207 04/27/13 0737  GLUCAP 148* 86 80    Scheduled Meds: . acyclovir  500 mg Intravenous Q8H  . allopurinol  300 mg Oral Daily  . antiseptic oral rinse  15 mL Mouth Rinse q12n4p  . antiseptic oral rinse  15 mL Mouth Rinse BID  . aspirin  81 mg Oral Daily  . bisoprolol  10 mg Oral Daily  . dextromethorphan  60 mg Oral BID  . feeding supplement  237 mL Oral TID WC  . filgrastim (NEUPOGEN)  SQ  480 mcg Subcutaneous q1800  . fluconazole (DIFLUCAN) IV  200 mg Intravenous Q24H  . insulin aspart  0-15 Units Subcutaneous TID WC  . insulin aspart  0-5 Units Subcutaneous QHS  . insulin detemir  10 Units Subcutaneous QHS  . meropenem (MERREM) IV  1 g Intravenous Q8H  . pantoprazole  40 mg Oral Daily  . potassium phosphate IVPB (mmol)  20 mmol Intravenous Once  . protein supplement  1 scoop Oral TID WC  . vancomycin  750 mg Intravenous Q12H    Continuous Infusions: . 0.9 % NaCl with KCl 40 mEq / L 100 mL/hr at 04/27/13 1610    Past Medical History  Diagnosis Date  . Hypertension   . Renal insufficiency   . Osteopenia   . Allergy   . Heart murmur   . GERD (gastroesophageal reflux disease)   . Anemia   . Hypercalcemia 03/27/2013  . Non-Hodgkins lymphoma 04/06/2013  . Diastolic dysfunction, grade I 9/60/4540    Past Surgical History  Procedure Laterality Date  . Appendectomy    . Colonoscopy  2008  . Spine surgery    . Back surgery      LOWER BACK TWICE  . Abdominal hysterectomy    . Esophagogastroduodenoscopy N/A 03/29/2013    Procedure: ESOPHAGOGASTRODUODENOSCOPY (EGD);  Surgeon: Charna Elizabeth, MD;  Location: [facility];  Service: Endoscopy;  Laterality: N/A;  . Inguinal lymph node biopsy Right 03/31/2013    Procedure: INGUINAL LYMPH NODE BIOPSY;  Surgeon: Cherylynn Ridges, MD;  Location: [facility] OR;  Service: General;  Laterality: Right;    Levon Hedger MS, RD, LDN [phone] Pager [phone] After Hours Pager

## 2013-04-27 NOTE — Evaluation (Signed)
Physical Therapy Evaluation Patient Details Name: Tonya Wise MRN: [id number] DOB: [birth year] Today's Date: 04/27/2013 Time: 7829-5621 PT Time Calculation (min): 38 min  PT Assessment / Plan / Recommendation History of Present Illness  PALYN SCRIMA is an 72 y.o. female with a PMH of recently diagnosed NHL who was admitted on 04/10/2013 with dehydration, acute renal failure, hypercalcemia and rigors. She had a prolonged hospital course complicated by the development of lethargy and confusion. She underwent her first cycle of chemotherapy on 04/18/2013 after placement of a Port-A-Cath. She was slow to progress secondary to deconditioning, anorexia, and oral thrush/HSV with poor oral intake, and it was recommended that she be discharged to a rehabilitation facility however she opted to return home with home health physical therapy. She was alert and felt well at the time of discharge. Since discharge, the patient developed worsening weakness and her elderly husband is unable to manage her care safely in the home. She has been unable to stand or ambulate without 2+ assist and has not had any meaningful oral intake since discharge. Sores on her mouth seem to have gotten worse.  Clinical Impression  **Pt admitted with *weakness, neutropenic fever, ?herpes meningitis*. Pt currently with functional limitations due to the deficits listed below (see PT Problem List). * Pt will benefit from skilled PT to increase their independence and safety with mobility to allow discharge to the venue listed below.* *    PT Assessment       Follow Up Recommendations  SNF    Does the patient have the potential to tolerate intense rehabilitation      Barriers to Discharge        Equipment Recommendations  Hospital bed    Recommendations for Other Services     Frequency Min 3X/week    Precautions / Restrictions Precautions Precautions: Fall Restrictions Weight Bearing Restrictions: No   Pertinent  Vitals/Pain **0/10 pain SaO2 93% on 3L O2 Audible wheezing noted with activity*      Mobility  Bed Mobility Bed Mobility: Supine to Sit Supine to Sit: 3: Mod assist;HOB elevated Details for Bed Mobility Assistance: Mod assist to support B LE off bed with increased time Transfers Transfers: Sit to Stand;Stand to Sit;Stand Pivot Transfers Sit to Stand: From bed;From toilet;3: Mod assist;From chair/3-in-1 Stand to Sit: To chair/3-in-1;To toilet;4: Min assist Stand Pivot Transfers: 4: Min assist Details for Transfer Assistance: VCs hand placement, SPT with RW x 2 from bed to BSC, BSC to recliner; sit to stand x 3 Ambulation/Gait Ambulation/Gait Assistance: Not tested (comment)    Exercises General Exercises - Lower Extremity Heel Slides: AAROM;Both;10 reps;Supine Hip ABduction/ADduction: AAROM;Both;10 reps;Supine   PT Diagnosis: Difficulty walking;Generalized weakness  PT Problem List: Decreased strength;Decreased activity tolerance;Decreased mobility;Decreased knowledge of use of DME;Decreased safety awareness PT Treatment Interventions: DME instruction;Gait training;Stair training;Functional mobility training;Therapeutic activities;Therapeutic exercise;Patient/family education     PT Goals(Current goals can be found in the care plan section) Acute Rehab PT Goals Patient Stated Goal: to walk PT Goal Formulation: With patient/family Time For Goal Achievement: 05/11/13 Potential to Achieve Goals: Good  Visit Information  Last PT Received On: 04/27/13 Assistance Needed: +2 History of Present Illness: ABRI VACCA is an 72 y.o. female with a PMH of recently diagnosed NHL who was admitted on 04/10/2013 with dehydration, acute renal failure, hypercalcemia and rigors. She had a prolonged hospital course complicated by the development of lethargy and confusion. She underwent her first cycle of chemotherapy on 04/18/2013 after placement of a Port-A-Cath.  She was slow to progress  secondary to deconditioning, anorexia, and oral thrush/HSV with poor oral intake, and it was recommended that she be discharged to a rehabilitation facility however she opted to return home with home health physical therapy. She was alert and felt well at the time of discharge. Since discharge, the patient developed worsening weakness and her elderly husband is unable to manage her care safely in the home. She has been unable to stand or ambulate without 2+ assist and has not had any meaningful oral intake since discharge. Sores on her mouth seem to have gotten worse.       Prior Functioning  Home Living Family/patient expects to be discharged to:: Skilled nursing facility (admitted from home, family agreeable to SNF if needed) Living Arrangements: Spouse/significant other Available Help at Discharge: Family Home Layout: Two level;Able to live on main level with bedroom/bathroom Alternate Level Stairs-Number of Steps: 2 Alternate Level Stairs-Rails: None Home Equipment: Walker - 2 wheels;Bedside commode;Wheelchair - manual;Shower seat Prior Function Level of Independence: Needs assistance Gait / Transfers Assistance Needed: assist needed, pt had become very weak ADL's / Homemaking Assistance Needed: assist to get into shower and bathe Communication Communication: No difficulties    Cognition  Cognition Arousal/Alertness: Awake/alert;Lethargic (increased time to answer questions) Behavior During Therapy: WFL for tasks assessed/performed Overall Cognitive Status: Within Functional Limits for tasks assessed    Extremity/Trunk Assessment Upper Extremity Assessment Upper Extremity Assessment: Generalized weakness Lower Extremity Assessment Lower Extremity Assessment: Generalized weakness;RLE deficits/detail;LLE deficits/detail RLE Deficits / Details: edema in  both legs. pt is able to activate all muscle groups but has difficulty lifting legs due to weight from edema RLE Sensation: decreased  light touch;decreased proprioception LLE Deficits / Details: as above LLE Sensation: decreased light touch;decreased proprioception Cervical / Trunk Assessment Cervical / Trunk Assessment: Normal   Balance Balance Balance Assessed: Yes Static Sitting Balance Static Sitting - Balance Support: No upper extremity supported;Feet supported Static Sitting - Level of Assistance: 5: Stand by assistance Static Sitting - Comment/# of Minutes: 3  End of Session PT - End of Session Equipment Utilized During Treatment: Gait belt Activity Tolerance: Patient limited by fatigue Patient left: in chair;with call bell/phone within reach Nurse Communication: Mobility status  GP     Ralene Bathe Kistler 04/27/2013, 9:54 AM [phone]

## 2013-04-28 ENCOUNTER — Inpatient Hospital Stay (HOSPITAL_COMMUNITY): Payer: Medicare Other

## 2013-04-28 DIAGNOSIS — R109 Unspecified abdominal pain: Secondary | ICD-10-CM | POA: Diagnosis not present

## 2013-04-28 DIAGNOSIS — D709 Neutropenia, unspecified: Secondary | ICD-10-CM | POA: Diagnosis not present

## 2013-04-28 DIAGNOSIS — I5042 Chronic combined systolic (congestive) and diastolic (congestive) heart failure: Secondary | ICD-10-CM | POA: Diagnosis not present

## 2013-04-28 DIAGNOSIS — Z4682 Encounter for fitting and adjustment of non-vascular catheter: Secondary | ICD-10-CM | POA: Diagnosis not present

## 2013-04-28 DIAGNOSIS — E162 Hypoglycemia, unspecified: Secondary | ICD-10-CM | POA: Diagnosis present

## 2013-04-28 DIAGNOSIS — R5081 Fever presenting with conditions classified elsewhere: Secondary | ICD-10-CM | POA: Diagnosis not present

## 2013-04-28 LAB — BASIC METABOLIC PANEL
CO2: 23 mEq/L (ref 19–32)
Calcium: 6.9 mg/dL — ABNORMAL LOW (ref 8.4–10.5)
Chloride: 104 mEq/L (ref 96–112)
GFR calc Af Amer: 68 mL/min — ABNORMAL LOW (ref >90)
GFR calc non Af Amer: 58 mL/min — ABNORMAL LOW (ref >90)
Potassium: 3.2 mEq/L — ABNORMAL LOW (ref 3.5–5.1)
Sodium: 138 mEq/L (ref 135–145)

## 2013-04-28 LAB — GLUCOSE, CAPILLARY
Glucose-Capillary: 99 mg/dL (ref 70–99)
Glucose-Capillary: 120 mg/dL — ABNORMAL HIGH (ref 70–99)
Glucose-Capillary: 155 mg/dL — ABNORMAL HIGH (ref 70–99)

## 2013-04-28 LAB — CBC WITH DIFFERENTIAL/PLATELET
Basophils Relative: 1 % (ref 0–1)
Eosinophils Absolute: 0 10*3/uL (ref 0.0–0.7)
Eosinophils Relative: 1 % (ref 0–5)
HCT: 26.9 % — ABNORMAL LOW (ref 36.0–46.0)
Hemoglobin: 8.9 g/dL — ABNORMAL LOW (ref 12.0–15.0)
Lymphocytes Relative: 55 % — ABNORMAL HIGH (ref 12–46)
MCHC: 33.1 g/dL (ref 30.0–36.0)
MCV: 80.1 fL (ref 78.0–100.0)
Monocytes Absolute: 0.2 10*3/uL (ref 0.1–1.0)
Neutro Abs: 0.2 10*3/uL — ABNORMAL LOW (ref 1.7–7.7)
Neutrophils Relative %: 20 % — ABNORMAL LOW (ref 43–77)
RBC: 3.36 MIL/uL — ABNORMAL LOW (ref 3.87–5.11)
RDW: 18 % — ABNORMAL HIGH (ref 11.5–15.5)

## 2013-04-28 LAB — PHOSPHORUS: Phosphorus: 2.3 mg/dL (ref 2.3–4.6)

## 2013-04-28 MED ORDER — OSMOLITE 1.2 CAL PO LIQD
1000.0000 mL | ORAL | Status: AC
  Administered 2013-04-28: 1000 mL

## 2013-04-28 MED ORDER — POTASSIUM CHLORIDE 10 MEQ/50ML IV SOLN
10.0000 meq | INTRAVENOUS | Status: AC
  Administered 2013-04-28 (×4): 10 meq via INTRAVENOUS
  Filled 2013-04-28 (×4): qty 50

## 2013-04-28 MED ORDER — INSULIN ASPART 100 UNIT/ML ~~LOC~~ SOLN
0.0000 [IU] | Freq: Three times a day (TID) | SUBCUTANEOUS | Status: DC
  Administered 2013-04-29: 3 [IU] via SUBCUTANEOUS
  Administered 2013-04-29: 1 [IU] via SUBCUTANEOUS

## 2013-04-28 MED ORDER — OSMOLITE 1.2 CAL PO LIQD: 1000.0000 mL | ORAL | Status: DC

## 2013-04-28 MED ORDER — FUROSEMIDE 10 MG/ML IJ SOLN
40.0000 mg | Freq: Once | INTRAMUSCULAR | Status: AC
  Administered 2013-04-28: 40 mg via INTRAVENOUS
  Filled 2013-04-28: qty 4

## 2013-04-28 NOTE — Progress Notes (Addendum)
[facility] PROGRESS NOTE  DAVEENA ELMORE JWJ:[id number] DOB: [birth year] DOA: 04/25/2013 PCP: [name], MD  Brief narrative: Tonya Wise is an 72 y.o. female SHEILLA MARIS is an 73 y.o. female with a PMH of recently diagnosed NHL who was admitted on 04/10/2013 with dehydration, acute renal failure, hypercalcemia and rigors. She had a prolonged hospital course complicated by the development of lethargy and confusion. She underwent her first cycle of chemotherapy on 04/18/2013 after placement of a Port-A-Cath. She was slow to progress secondary to deconditioning, anorexia, and oral thrush/HSV with poor oral intake, and it was recommended that she be discharged to a rehabilitation facility however she opted to return home with home health physical therapy. She was alert and felt well at the time of discharge, but within 24 hours developed worsening lethargy, weakness and failure to thrive prompting her husband to bring her back to the hospital. Upon initial evaluation, she had pancytopenia and evidence of extensive herpetic lesions about the mouth. She also developed fever after presentation and was placed on broad-spectrum antibiotics for neutropenic fever. At this time, her nutritional status is extremely poor and she is severely malnourished and so the plan is for placement of a Panda tube to improve her nutritional status. When she is medically stable, recommendations are for her to go to a SNF for rehabilitation.  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 First cycle of R-CHOP done 04/18/2013.  -Given immunosuppression and neutropenic fever, she is being treated with high-dose acyclovir as well as empiric vancomycin/Merropenem and Diflucan.  -Unable to safely perform LP secondary to significant  thrombocytopenia.  -Blood cultures negative to date, urinalysis negative for nitrites and leukocytes.  -Continue Neupogen, which was started on 04/25/2013. No recovery of WBC yet. Active Problems: Acute on chronic diastolic heart failure -2-D echocardiogram done 04/10/2013, grade 1 diastolic dysfunction noted. -Developed decompensated heart failure overnight 04/27/2013-04/28/2013. Given Lasix. Subsequently diuresed 2.9 L.  Hypophosphatemia -Repleted. Monitor daily with initiation of tube feeds as the patient is at high-risk for refeeding syndrome. Hypomagnesemia -Repleted. Hypokalemia -Replete. Diabetes / hypoglycemia  -CBG is 80-103 but glucose 64 on BMET.  Hold Levemir.  Change SSI to insulin sensitive scale.  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 Was discharged home on therapy with Valtrex.  -CMV IgM negative.  IgG pending, but with negative IgM titers, CMV not likely.  -Currently on high-dose acyclovir.  Anorexia / severe protein calorie malnutrition  -Seen by dietitian on 04/13/2013.  -Now agreeable to feeding tube for supplemental nutrition. We'll place a Panda and initiate tube feedings. Grade I diastolic dysfunction  -No current evidence of decompensated heart failure.  Chronic cough  -Chest x-ray negative for pneumonia. Continue Delsym.  Microcytic anemia, chronic  -S/P normal EGD on 03/29/13. BX negative for celiac sprue.  -Likely anemia of chronic disease related to malignancy.  -Given one unit of blood on 04/26/2013 with post transfusion hemoglobin of 8.7 mg/dL. Elevated LFTs / hepatic steatosis  -Hepatic steatosis noted on CT abdomen/pelvis done 03/30/13.  Code Status: DNR  Family Communication: Husband and daughter updated at bedside.  Disposition Plan: Likely will need SNF for rehab.  Medical Consultants:  Dr. Rosie Fate,  Oncology  Other Consultants:  None.  Anti-infectives:  Acyclovir 04/25/2013--->  Vancomycin  04/25/2013--->  Meropenem 04/25/2013--->  Diflucan 04/27/2013--->  HPI/Subjective: KLYNN LINNEMANN remains weak with inability to swallow secondary to burning pain. She had an episode of dyspnea and wheezing last evening. She was given nebulized bronchodilator therapy and a dose of Lasix her respiratory status has improved today. Her bowels moved this morning, solid stool. No nausea or vomiting.  Objective: Filed Vitals:   04/27/13 0939 04/27/13 1431 04/27/13 2118 04/28/13 0437  BP:  126/76 126/90 131/93  Pulse:  100 112 114  Temp:  97.7 F (36.5 C) 98 F (36.7 C) 98.2 F (36.8 C)  TempSrc:  Oral Oral Oral  Resp:  18 18 18   Height:      Weight:      SpO2: 93% 98% 94% 95%    Intake/Output Summary (Last 24 hours) at 04/28/13 1043 Last data filed at 04/28/13 1610  Gross per 24 hour  Intake      0 ml  Output   2900 ml  Net  -2900 ml    Exam: Gen:  More awake and alert Mouth: Perioral lesions persist, scabbed over. Tongue lesions beginning to show improvement. Cardiovascular:  Tachycardic, regular, no murmurs, rubs, or gallops Respiratory:  Lung sounds diminished Gastrointestinal:  Abdomen soft, NT/ND, + BS Extremities:  4+ edema  Data Reviewed:  Basic Metabolic Panel:  Recent Labs Lab 04/22/13 0530 04/23/13 0500 04/25/13 1410 04/26/13 0610 04/27/13 0545 04/28/13 0500  NA 137 133* 134* 137 138 138  K 4.1 4.0 3.5 3.3* 3.3* 3.2*  CL 105 103 102 105 105 104  CO2 22 22 23 24 23 23   GLUCOSE 303* 248* 135* 112* 69* 64*  BUN 52* 42* 23 17 15 15   CREATININE 1.60* 1.30* 1.00 0.91 0.92 0.95  CALCIUM 7.6* 8.0* 7.7* 7.1* 7.0* 6.9*  MG 1.6  --  1.4* 1.7 1.6 1.6  PHOS 3.3  --  2.0* 2.1* 1.8* 2.3   GFR Estimated Creatinine Clearance: 53.5 ml/min (by C-G formula based on Cr of 0.95). Liver Function Tests:  Recent Labs Lab 04/22/13 0530 04/23/13 0500 04/25/13 1410  AST  24 25 35  ALT 10 12 16   ALKPHOS 157* 121* 154*  BILITOT 1.1 1.2 1.0  PROT 4.1* 4.1* 4.0*  ALBUMIN 1.2* 1.3* 1.5*   Coagulation profile  Recent Labs Lab 04/25/13 1443  INR 1.40    CBC:  Recent Labs Lab 04/23/13 0500 04/25/13 1410 04/26/13 0610 04/27/13 0545 04/28/13 0500  WBC 6.5 0.7* 0.5* 0.4* 0.8*  NEUTROABS  --  0.4* 0.3* 0.1* 0.2*  HGB 8.2* 6.9* 6.2* 8.9* 8.9*  HCT 25.5* 21.1* 18.8* 26.7* 26.9*  MCV 81.0 81.5 81.0 79.2 80.1  PLT 53* 15* 17* 32* 68*   CBG:  Recent Labs Lab 04/27/13 0737 04/27/13 1203 04/27/13 1614 04/27/13 2149 04/28/13 0819  GLUCAP 80 99 103* 98 99   Microbiology Recent Results (from the past 240 hour(s))  CULTURE, BLOOD (ROUTINE X 2)     Status: None   Collection Time    04/25/13  1:57 PM      Result Value Range Status   Specimen Description PORTA CATH LINE, CENTRA   Final   Special Requests BOTTLES DRAWN AEROBIC AND ANAEROBIC [facility] EACH   Final   Culture  Setup Time     Final   Value: 04/25/2013 21:22     Performed at [facility]   Culture     Final   Value:        BLOOD CULTURE RECEIVED NO  GROWTH TO DATE CULTURE WILL BE HELD FOR 5 DAYS BEFORE ISSUING A FINAL NEGATIVE REPORT     Performed at [facility]   Report Status PENDING   Incomplete  CULTURE, BLOOD (ROUTINE X 2)     Status: None   Collection Time    04/25/13  2:25 PM      Result Value Range Status   Specimen Description BLOOD LEFT ARM   Final   Special Requests BOTTLES DRAWN AEROBIC AND ANAEROBIC 4.5CC EACH   Final   Culture  Setup Time     Final   Value: 04/25/2013 21:21     Performed at [facility]   Culture     Final   Value:        BLOOD CULTURE RECEIVED NO GROWTH TO DATE CULTURE WILL BE HELD FOR 5 DAYS BEFORE ISSUING A FINAL NEGATIVE REPORT     Performed at [facility]   Report Status PENDING   Incomplete  VIRAL CULTURE VIRC     Status: None   Collection Time    04/26/13  9:07 AM      Result Value Range Status   Specimen  Description MOUTH   Final   Special Requests Immunocompromised   Final   Culture     Final   Value: Culture has been initiated.     Performed at [facility]   Report Status PENDING   Incomplete      Procedures and Diagnostic Studies: Portable Chest 1 View  04/25/2013   CLINICAL DATA:  Chest pain. Weakness.  EXAM: PORTABLE CHEST - 1 VIEW  COMPARISON:  04/15/2013  FINDINGS: Lung base opacity seen on the prior study has improved most evident on the right. Some opacity persists that is likely scarring, atelectasis or a combination. A component of pneumonia is possible.  The lungs are otherwise clear.  No pleural effusion or pneumothorax.  The heart, mediastinum and hila are unremarkable.  New right anterior chest wall power Port-A-Cath has its tip in the lower superior vena cava.  IMPRESSION: Improved lung base opacities. This may be improved atelectasis, infiltrate or a combination. No new lung opacities.  New right anterior chest wall power Port-A-Cath is well positioned. No pneumothorax.   Electronically Signed   By: Amie Portland   On: 04/25/2013 14:34     Scheduled Meds: . acyclovir  500 mg Intravenous Q8H  . allopurinol  300 mg Oral Daily  . antiseptic oral rinse  15 mL Mouth Rinse q12n4p  . antiseptic oral rinse  15 mL Mouth Rinse BID  . aspirin  81 mg Oral Daily  . bisoprolol  10 mg Oral Daily  . dextromethorphan  60 mg Oral BID  . feeding supplement  237 mL Oral TID WC  . filgrastim (NEUPOGEN)  SQ  480 mcg Subcutaneous q1800  . fluconazole (DIFLUCAN) IV  200 mg Intravenous Q24H  . insulin aspart  0-9 Units Subcutaneous TID WC  . meropenem (MERREM) IV  1 g Intravenous Q8H  . multivitamin with minerals  1 tablet Oral Daily  . pantoprazole  40 mg Oral Daily  . potassium chloride  10 mEq Intravenous Q1 Hr x 4  . protein supplement  1 scoop Oral TID WC  . vancomycin  750 mg Intravenous Q12H   Continuous Infusions: . 0.9 % NaCl with KCl 40 mEq / L 20 mL/hr (04/28/13 0854)     Time spent: 35 minutes with > 50% of time discussing current diagnostic  test results, clinical impression and plan of care.    LOS: 3 days   Harris Kistler  [facility] Pager [phone].   *Please note that the hospitalists switch teams on Wednesdays. Please call the flow manager at [phone] if you are having difficulty reaching the hospitalist taking care of this patient as she can update you and provide the most up-to-date pager number of provider caring for the patient. If 8PM-8AM, please contact night-coverage at [email or web address], password [facility]  04/28/2013, 10:43 AM

## 2013-04-28 NOTE — Progress Notes (Signed)
ANTIBIOTIC CONSULT NOTE - FOLLOW UP  Pharmacy Consult for vancomycin/meropenem/acyclovir Indication: febrile neutropenia, r/o meningitis, r/o HSV esphagitis  Allergies  Allergen Reactions  . Penicillins Rash    Patient Measurements: Height: 5' 1.81" (157 cm) Weight: 184 lb 4.9 oz (83.6 kg) IBW/kg (Calculated) : 49.67   Vital Signs: Temp: 98.2 F (36.8 C) (09/30 0437) Temp src: Oral (09/30 0437) BP: 131/93 mmHg (09/30 0437) Pulse Rate: 114 (09/30 0437) Intake/Output from previous day: 09/29 0701 - 09/30 0700 In: 0  Out: 2900 [Urine:2900]  Labs:  Recent Labs  04/26/13 0610 04/27/13 0545 04/28/13 0500  WBC 0.5* 0.4* 0.8*  HGB 6.2* 8.9* 8.9*  PLT 17* 32* 68*  CREATININE 0.91 0.92 0.95   Estimated Creatinine Clearance: 53.5 ml/min (by C-G formula based on Cr of 0.95).   Microbiology, this admission and previous admisssion: 9/12 Blood x 2: NGF 9/17 Blood x 2: NGF 9/27 Blood x 2: ngtd 9/28 Viral: in process  Anti-infectives, this admission and previous admisssion 9/17 >> Vancomycin >> 9/20 9/17 >> Cefepime >> 9/20 9/19 >> Fluconazole x7 >> 9/26 9/20 >> Levaquin >> 9/23  9/27 >> Vanc >> 9/27 >> Meropenem >> 9/27 >> Acyclovir >> 9/29 >> fluconazole >>  Assessment: 72yo F with a PMH of recently diagnosed NHL who  recently admitted 9/12-9/26 with dehydration, ARF, and hypercalcemia of malignancy. During that admission the patient received her 1st cycle of R-CHOP with decreased dosing for performance status under the care of Dr. Rosie Fate.  The patient was readmitted 9/27 with weakness and failure to thrive. Pharmacy was consulted to dose vancomycin, meropenem, and acyclovir in this immnumocompromised patient.  Today is D4 Acyclovir IV/vancomycin/meropenem for r/o HSV encephalitis/neutropenic fever  Culture results as above  CMV PCR is pending, the IgM is negative, CMV unlikely per MD, no need to change acyclovir to ganciclovir  Patient is afebrile  WBC still  low at 0.8K, ANC 0.2K, patient remains on Neupogen daily  Renal function is WNL and stable  Noted that both vancomycin and meropenem doses due at 1600 on 9/29 were held due to volume overload. The patient has received Lasix and antibiotics are back on schedule. Due to the missed dose, the patient is no longer and steady state and so I can not check a vancomycin trough at this time  Patient to go to a SNF once medically stable for discharge   Goal of Therapy:  Vancomycin trough level 15-20 mcg/ml eradication of infection  Plan:  - continue vancomycin at 750mg  IV q12h - vancomycin trough at steady state - continue meropenem at 1g IV q8h - continue acyclovir at 500mg  IV q8h - follow-up culture results, clinical course, discharge planning - follow-up antibiotic de-escalation and length of therapy  Thank you for the consult.  Tomi Bamberger, PharmD Clinical Pharmacist Pager: [phone] Pharmacy: [phone] 04/28/2013 1:21 PM

## 2013-04-28 NOTE — Progress Notes (Addendum)
Clinical Social Work Department CLINICAL SOCIAL WORK PLACEMENT NOTE 04/28/2013  Patient:  Tonya Wise, Tonya Wise  Account Number:  [id number] Admit date:  04/25/2013  Clinical Social Worker:  Jacelyn Grip  Date/time:  04/28/2013 08:15 AM  Clinical Social Work is seeking post-discharge placement for this patient at the following level of care:   SKILLED NURSING   (*CSW will update this form in Epic as items are completed)   04/27/2013  Patient/family provided with [facility] Department of Clinical Social Work's list of facilities offering this level of care within the geographic area requested by the patient (or if unable, by the patient's family).  04/27/2013  Patient/family informed of their freedom to choose among providers that offer the needed level of care, that participate in Medicare, Medicaid or managed care program needed by the patient, have an available bed and are willing to accept the patient.  04/27/2013  Patient/family informed of [facility]' ownership interest in [facility] as well as of the fact that they are under no obligation to receive care at this facility.  PASARR submitted to EDS on 04/27/2013 PASARR number received from EDS on 04/27/2013  FL2 transmitted to all facilities in geographic area requested by pt/family on  04/28/2013 FL2 transmitted to all facilities within larger geographic area on   Patient informed that his/her managed care company has contracts with or will negotiate with  certain facilities, including the following:     Patient/family informed of bed offers received:  Not provided due to pt having PANDA tube placed and d/c to LTAC Patient chooses bed at [facility] Physician recommends and patient chooses bed at    Patient to be transferred to  on  [facility] on 04/29/2013 Patient to be transferred to facility by [facility]  The following physician request were entered in Epic:   Additional Comments:    Jacklynn Lewis,  MSW, LCSWA  Clinical Social Work [phone]

## 2013-04-28 NOTE — Progress Notes (Signed)
NUTRITION FOLLOW UP  Intervention:   - Initiate TF of Osmolite 1.2 start at 75ml/hr and increase by 10ml every 12 hours to goal of 17ml/hr. Goal rate will provide 1584 calories, 73g protein, free water meeting 106% estimated calorie needs, 122% estimated protein needs - Pt at high refeeding risk r/t minimal PO intake for almost 3 weeks. Recommend close monitoring and repletion of pt's potassium, magnesium, and phosphorus - Will initiate adult enteral protocol - Recommend nursing get updated weight - Will continue to monitor closely   Nutrition Dx:   Inadequate oral intake related to ongoing poor appetite and painful mouth sores as evidenced by pt report - ongoing   Goal:   Initiation of enteral nutrition with goal to meet >90% of estimated nutritional needs - not met but advancing slowly   Monitor:   Weights, labs, TF initiation, tolerance, and advancement  Assessment:   Pt discussed during multidisciplinary rounds.   Pt agreeable to panda tube placement to improve nutritional status. Pt at high refeeding risk, discussed with MD. Pt with low potassium today, magnesium, and phosphorus WNL. No new weights.  Height: Ht Readings from Last 1 Encounters:  04/26/13 5' 1.81" (1.57 m)    Weight Status:   Wt Readings from Last 1 Encounters:  04/26/13 184 lb 4.9 oz (83.6 kg)    Re-estimated needs:  Kcal: 1500-1750  Protein: 60-75g  Fluid: 1.5-1.7L/day   Skin: +2 generalized edema, +2 RUE, LUE edema, RLE, LLE edema    Diet Order: Dysphagia 3, thin   Intake/Output Summary (Last 24 hours) at 04/28/13 1200 Last data filed at 04/28/13 0858  Gross per 24 hour  Intake    120 ml  Output   2900 ml  Net  -2780 ml    Last BM: 9/25   Labs:   Recent Labs Lab 04/26/13 0610 04/27/13 0545 04/28/13 0500  NA 137 138 138  K 3.3* 3.3* 3.2*  CL 105 105 104  CO2 24 23 23   BUN 17 15 15   CREATININE 0.91 0.92 0.95  CALCIUM 7.1* 7.0* 6.9*  MG 1.7 1.6 1.6  PHOS 2.1* 1.8* 2.3   GLUCOSE 112* 69* 64*    CBG (last 3)   Recent Labs  04/27/13 1614 04/27/13 2149 04/28/13 0819  GLUCAP 103* 98 99    Scheduled Meds: . acyclovir  500 mg Intravenous Q8H  . allopurinol  300 mg Oral Daily  . antiseptic oral rinse  15 mL Mouth Rinse q12n4p  . antiseptic oral rinse  15 mL Mouth Rinse BID  . aspirin  81 mg Oral Daily  . bisoprolol  10 mg Oral Daily  . dextromethorphan  60 mg Oral BID  . feeding supplement  237 mL Oral TID WC  . filgrastim (NEUPOGEN)  SQ  480 mcg Subcutaneous q1800  . fluconazole (DIFLUCAN) IV  200 mg Intravenous Q24H  . insulin aspart  0-9 Units Subcutaneous TID WC  . meropenem (MERREM) IV  1 g Intravenous Q8H  . multivitamin with minerals  1 tablet Oral Daily  . pantoprazole  40 mg Oral Daily  . potassium chloride  10 mEq Intravenous Q1 Hr x 4  . protein supplement  1 scoop Oral TID WC  . vancomycin  750 mg Intravenous Q12H    Continuous Infusions: . 0.9 % NaCl with KCl 40 mEq / L 20 mL/hr (04/28/13 0854)     Levon Hedger MS, RD, LDN [phone] Pager [phone] After Hours Pager

## 2013-04-28 NOTE — Progress Notes (Signed)
Clinical Social Work Department BRIEF PSYCHOSOCIAL ASSESSMENT 04/28/2013  Patient:  Tonya Wise, Tonya Wise     Account Number:  [id number]     Admit date:  04/25/2013  Clinical Social Worker:  Jacelyn Grip  Date/Time:  04/27/2013 04:30 PM  Referred by:  Physician  Date Referred:  04/27/2013 Referred for  SNF Placement   Other Referral:   Interview type:  Family Other interview type:    PSYCHOSOCIAL DATA Living Status:  HUSBAND Admitted from facility:   Level of care:   Primary support name:  Tonya Wise/spouse/[phone] Primary support relationship to patient:  SPOUSE Degree of support available:   strong    CURRENT CONCERNS Current Concerns  Post-Acute Placement   Other Concerns:    SOCIAL WORK ASSESSMENT / PLAN CSW received referral for New SNF.    Pt familiar to CSW from previous admission, but at that time pt and pt spouse were adamant about trying to meet pt needs at home.    CSW met with pt and pt spouse at bedside. Pt resting comfortably at this time. CSW discussed with pt spouse recommendation for rehab at SNF at discharge. Pt spouse stated that he feels that this will be the most appropriate option for pt at this time. Pt spouse agreeable to SNF search in [facility]. CSW discussed process of SNF search and provided SNF list.    CSW completed FL2 and initiated SNF search to [facility].    CSW to follow up with pt and pt spouse re: SNF bed offers.    CSW to continue to follow and facilitate pt discharge needs when pt medically stable for discharge.   Assessment/plan status:  Psychosocial Support/Ongoing Assessment of Needs Other assessment/ plan:   discharge planning   Information/referral to community resources:   [facility] list    PATIENT'S/FAMILY'S RESPONSE TO PLAN OF CARE: Per chart, pt alert and oriented x 4 however pt resting during time of assessment and did not engage in assessment at this time. Pt spouse realizes that pt care is  unable to managed at home at this time and agreeable to SNF.    Jacklynn Lewis, MSW, LCSWA  Clinical Social Work [phone]

## 2013-04-28 NOTE — Progress Notes (Signed)
CSW continuing to follow.  CSW noted that pt had panda placed today which cannot be managed at SNF. CSW will continue to follow pt nutritional status and follow up with pt family re: SNF when appropriate.  CSW to continue to follow.   Jacklynn Lewis, MSW, LCSWA  Clinical Social Work [phone]

## 2013-04-29 DIAGNOSIS — I129 Hypertensive chronic kidney disease with stage 1 through stage 4 chronic kidney disease, or unspecified chronic kidney disease: Secondary | ICD-10-CM | POA: Diagnosis not present

## 2013-04-29 DIAGNOSIS — C8589 Other specified types of non-Hodgkin lymphoma, extranodal and solid organ sites: Secondary | ICD-10-CM | POA: Diagnosis not present

## 2013-04-29 DIAGNOSIS — R627 Adult failure to thrive: Secondary | ICD-10-CM | POA: Diagnosis not present

## 2013-04-29 DIAGNOSIS — E876 Hypokalemia: Secondary | ICD-10-CM | POA: Diagnosis not present

## 2013-04-29 DIAGNOSIS — E43 Unspecified severe protein-calorie malnutrition: Secondary | ICD-10-CM | POA: Diagnosis not present

## 2013-04-29 DIAGNOSIS — J90 Pleural effusion, not elsewhere classified: Secondary | ICD-10-CM | POA: Diagnosis not present

## 2013-04-29 DIAGNOSIS — D61810 Antineoplastic chemotherapy induced pancytopenia: Secondary | ICD-10-CM | POA: Diagnosis not present

## 2013-04-29 DIAGNOSIS — E1169 Type 2 diabetes mellitus with other specified complication: Secondary | ICD-10-CM | POA: Diagnosis not present

## 2013-04-29 DIAGNOSIS — I5033 Acute on chronic diastolic (congestive) heart failure: Secondary | ICD-10-CM | POA: Diagnosis not present

## 2013-04-29 DIAGNOSIS — Z79899 Other long term (current) drug therapy: Secondary | ICD-10-CM | POA: Diagnosis not present

## 2013-04-29 DIAGNOSIS — Z4682 Encounter for fitting and adjustment of non-vascular catheter: Secondary | ICD-10-CM | POA: Diagnosis not present

## 2013-04-29 DIAGNOSIS — B370 Candidal stomatitis: Secondary | ICD-10-CM | POA: Diagnosis not present

## 2013-04-29 DIAGNOSIS — C8299 Follicular lymphoma, unspecified, extranodal and solid organ sites: Secondary | ICD-10-CM | POA: Diagnosis not present

## 2013-04-29 DIAGNOSIS — K7689 Other specified diseases of liver: Secondary | ICD-10-CM | POA: Diagnosis not present

## 2013-04-29 DIAGNOSIS — R5081 Fever presenting with conditions classified elsewhere: Secondary | ICD-10-CM | POA: Diagnosis not present

## 2013-04-29 DIAGNOSIS — E119 Type 2 diabetes mellitus without complications: Secondary | ICD-10-CM | POA: Diagnosis not present

## 2013-04-29 DIAGNOSIS — G9341 Metabolic encephalopathy: Secondary | ICD-10-CM

## 2013-04-29 DIAGNOSIS — N179 Acute kidney failure, unspecified: Secondary | ICD-10-CM | POA: Diagnosis not present

## 2013-04-29 DIAGNOSIS — Z794 Long term (current) use of insulin: Secondary | ICD-10-CM | POA: Diagnosis not present

## 2013-04-29 DIAGNOSIS — D61818 Other pancytopenia: Secondary | ICD-10-CM | POA: Diagnosis not present

## 2013-04-29 DIAGNOSIS — D630 Anemia in neoplastic disease: Secondary | ICD-10-CM | POA: Diagnosis not present

## 2013-04-29 DIAGNOSIS — B004 Herpesviral encephalitis: Secondary | ICD-10-CM | POA: Diagnosis not present

## 2013-04-29 DIAGNOSIS — D638 Anemia in other chronic diseases classified elsewhere: Secondary | ICD-10-CM | POA: Diagnosis not present

## 2013-04-29 DIAGNOSIS — M899 Disorder of bone, unspecified: Secondary | ICD-10-CM | POA: Diagnosis not present

## 2013-04-29 DIAGNOSIS — N189 Chronic kidney disease, unspecified: Secondary | ICD-10-CM | POA: Diagnosis not present

## 2013-04-29 DIAGNOSIS — J40 Bronchitis, not specified as acute or chronic: Secondary | ICD-10-CM | POA: Diagnosis not present

## 2013-04-29 DIAGNOSIS — K219 Gastro-esophageal reflux disease without esophagitis: Secondary | ICD-10-CM | POA: Diagnosis not present

## 2013-04-29 DIAGNOSIS — K208 Other esophagitis without bleeding: Secondary | ICD-10-CM | POA: Diagnosis not present

## 2013-04-29 DIAGNOSIS — I10 Essential (primary) hypertension: Secondary | ICD-10-CM | POA: Diagnosis not present

## 2013-04-29 DIAGNOSIS — Z880 Allergy status to penicillin: Secondary | ICD-10-CM | POA: Diagnosis not present

## 2013-04-29 DIAGNOSIS — A419 Sepsis, unspecified organism: Secondary | ICD-10-CM | POA: Diagnosis not present

## 2013-04-29 DIAGNOSIS — T451X5A Adverse effect of antineoplastic and immunosuppressive drugs, initial encounter: Secondary | ICD-10-CM

## 2013-04-29 DIAGNOSIS — D709 Neutropenia, unspecified: Secondary | ICD-10-CM | POA: Diagnosis not present

## 2013-04-29 DIAGNOSIS — Z6834 Body mass index (BMI) 34.0-34.9, adult: Secondary | ICD-10-CM | POA: Diagnosis not present

## 2013-04-29 DIAGNOSIS — Z87891 Personal history of nicotine dependence: Secondary | ICD-10-CM | POA: Diagnosis not present

## 2013-04-29 DIAGNOSIS — K121 Other forms of stomatitis: Secondary | ICD-10-CM | POA: Diagnosis not present

## 2013-04-29 DIAGNOSIS — E118 Type 2 diabetes mellitus with unspecified complications: Secondary | ICD-10-CM | POA: Diagnosis not present

## 2013-04-29 DIAGNOSIS — Z9221 Personal history of antineoplastic chemotherapy: Secondary | ICD-10-CM | POA: Diagnosis not present

## 2013-04-29 DIAGNOSIS — R05 Cough: Secondary | ICD-10-CM | POA: Diagnosis not present

## 2013-04-29 DIAGNOSIS — I5030 Unspecified diastolic (congestive) heart failure: Secondary | ICD-10-CM | POA: Diagnosis not present

## 2013-04-29 DIAGNOSIS — Z66 Do not resuscitate: Secondary | ICD-10-CM | POA: Diagnosis not present

## 2013-04-29 LAB — CBC WITH DIFFERENTIAL/PLATELET
Basophils Relative: 1 % (ref 0–1)
Eosinophils Absolute: 0 10*3/uL (ref 0.0–0.7)
Eosinophils Relative: 1 % (ref 0–5)
HCT: 26 % — ABNORMAL LOW (ref 36.0–46.0)
Hemoglobin: 8.4 g/dL — ABNORMAL LOW (ref 12.0–15.0)
Lymphocytes Relative: 26 % (ref 12–46)
Lymphs Abs: 0.4 10*3/uL — ABNORMAL LOW (ref 0.7–4.0)
MCHC: 32.3 g/dL (ref 30.0–36.0)
MCV: 81.8 fL (ref 78.0–100.0)
Monocytes Relative: 23 % — ABNORMAL HIGH (ref 3–12)
Neutrophils Relative %: 49 % (ref 43–77)
RBC: 3.18 MIL/uL — ABNORMAL LOW (ref 3.87–5.11)
RDW: 18.3 % — ABNORMAL HIGH (ref 11.5–15.5)
WBC: 1.7 10*3/uL — ABNORMAL LOW (ref 4.0–10.5)

## 2013-04-29 LAB — BASIC METABOLIC PANEL
CO2: 25 mEq/L (ref 19–32)
Chloride: 103 mEq/L (ref 96–112)
Creatinine, Ser: 0.96 mg/dL (ref 0.50–1.10)
GFR calc Af Amer: 67 mL/min — ABNORMAL LOW (ref >90)
Glucose, Bld: 176 mg/dL — ABNORMAL HIGH (ref 70–99)
Sodium: 137 mEq/L (ref 135–145)

## 2013-04-29 LAB — PHOSPHORUS: Phosphorus: 1.5 mg/dL — ABNORMAL LOW (ref 2.3–4.6)

## 2013-04-29 LAB — MAGNESIUM: Magnesium: 1.6 mg/dL (ref 1.5–2.5)

## 2013-04-29 LAB — GLUCOSE, CAPILLARY
Glucose-Capillary: 221 mg/dL — ABNORMAL HIGH (ref 70–99)
Glucose-Capillary: 141 mg/dL — ABNORMAL HIGH (ref 70–99)

## 2013-04-29 MED ORDER — ALUM & MAG HYDROXIDE-SIMETH 200-200-20 MG/5ML PO SUSP: 30.0000 mL | Freq: Four times a day (QID) | ORAL | Status: DC | PRN

## 2013-04-29 MED ORDER — ACETAMINOPHEN 650 MG RE SUPP: 650.0000 mg | RECTAL | Status: DC | PRN

## 2013-04-29 MED ORDER — ADULT MULTIVITAMIN W/MINERALS CH: 1.0000 | ORAL_TABLET | Freq: Every day | ORAL | Status: DC

## 2013-04-29 MED ORDER — ALBUTEROL SULFATE (5 MG/ML) 0.5% IN NEBU: 2.5000 mg | INHALATION_SOLUTION | RESPIRATORY_TRACT | Status: DC | PRN

## 2013-04-29 MED ORDER — DEXTROSE 5 % IV SOLN: 500.0000 mg | Freq: Three times a day (TID) | INTRAVENOUS | Status: DC

## 2013-04-29 MED ORDER — SODIUM CHLORIDE 0.9 % IV SOLN: 1.0000 g | Freq: Three times a day (TID) | INTRAVENOUS | Status: DC

## 2013-04-29 MED ORDER — ONDANSETRON HCL 4 MG/2ML IJ SOLN: 4.0000 mg | Freq: Four times a day (QID) | INTRAMUSCULAR | Status: DC | PRN

## 2013-04-29 MED ORDER — POTASSIUM PHOSPHATE DIBASIC 3 MMOLE/ML IV SOLN
30.0000 mmol | Freq: Once | INTRAVENOUS | Status: AC
  Administered 2013-04-29: 30 mmol via INTRAVENOUS
  Filled 2013-04-29: qty 10

## 2013-04-29 MED ORDER — FILGRASTIM 480 MCG/1.6ML IJ SOLN: 480.0000 ug | Freq: Every day | INTRAMUSCULAR | Status: DC

## 2013-04-29 MED ORDER — OSMOLITE 1.2 CAL PO LIQD: 1000.0000 mL | ORAL | Status: DC

## 2013-04-29 MED ORDER — ENSURE COMPLETE PO LIQD: 237.0000 mL | Freq: Three times a day (TID) | ORAL | Status: DC

## 2013-04-29 MED ORDER — VANCOMYCIN HCL IN DEXTROSE 750-5 MG/150ML-% IV SOLN: 750.0000 mg | Freq: Two times a day (BID) | INTRAVENOUS | Status: DC

## 2013-04-29 MED ORDER — MORPHINE SULFATE 2 MG/ML IJ SOLN: 2.0000 mg | INTRAMUSCULAR | Status: DC | PRN

## 2013-04-29 MED ORDER — FUROSEMIDE 10 MG/ML IJ SOLN
40.0000 mg | Freq: Once | INTRAMUSCULAR | Status: AC
  Administered 2013-04-29: 40 mg via INTRAVENOUS
  Filled 2013-04-29: qty 4

## 2013-04-29 MED ORDER — BIOTENE DRY MOUTH MT LIQD: 15.0000 mL | Freq: Two times a day (BID) | OROMUCOSAL | Status: DC

## 2013-04-29 MED ORDER — ACETAMINOPHEN 325 MG PO TABS: 650.0000 mg | ORAL_TABLET | ORAL | Status: DC | PRN

## 2013-04-29 MED ORDER — FLUCONAZOLE IN SODIUM CHLORIDE 200-0.9 MG/100ML-% IV SOLN: 200.0000 mg | INTRAVENOUS | Status: DC

## 2013-04-29 MED ORDER — OSMOLITE 1.2 CAL PO LIQD
1000.0000 mL | ORAL | Status: DC
  Administered 2013-04-29: 1000 mL

## 2013-04-29 MED ORDER — POTASSIUM CHLORIDE IN NACL 40-0.9 MEQ/L-% IV SOLN: 10.0000 mL/h | INTRAVENOUS | Status: DC

## 2013-04-29 MED ORDER — INSULIN ASPART 100 UNIT/ML ~~LOC~~ SOLN: 0.0000 [IU] | Freq: Three times a day (TID) | SUBCUTANEOUS | Status: DC

## 2013-04-29 NOTE — Progress Notes (Signed)
Physical Therapy Treatment Patient Details Name: VARIE MACHAMER MRN: [id number] DOB: January 09, 1941 Today's Date: 04/29/2013 Time: 1139-1206 PT Time Calculation (min): 27 min  PT Assessment / Plan / Recommendation  History of Present Illness pt receving tube feedings via panda tube   PT Comments   Pt with good participation and effort with PT today  Follow Up Recommendations  LTACH     Does the patient have the potential to tolerate intense rehabilitation     Barriers to Discharge        Equipment Recommendations       Recommendations for Other Services    Frequency Min 3X/week   Progress towards PT Goals Progress towards PT goals: Progressing toward goals  Plan Discharge plan needs to be updated    Precautions / Restrictions Precautions Precautions: None   Pertinent Vitals/Pain Pt reports her arms and legs feel heavy    Mobility  Bed Mobility Bed Mobility: Supine to Sit Supine to Sit: 4: Min assist;HOB elevated Details for Bed Mobility Assistance: pt able to initiate rolling by pushing up with leg and reaching for bedrail Transfers Transfers: Sit to Stand;Stand to Sit;Stand Pivot Transfers Sit to Stand: From bed;From chair/3-in-1;4: Min assist;1: +2 Total assist;3: Mod assist Sit to Stand: Patient Percentage: 50% Stand to Sit: To bed;To chair/3-in-1 Stand Pivot Transfers: 4: Min assist Details for Transfer Assistance: pt required varying levels of assist with multiple trials of sit to stand.  She needed hand over hand assist to get left hand to bedrail to use it to push up but was better able to do the activity with this technique. Pt also assisted by raising bed height and pulling up on pad to initiate lift off Ambulation/Gait Ambulation/Gait Assistance: 3: Mod assist Ambulation Distance (Feet): 2 Feet Assistive device: Rolling walker Ambulation/Gait Assistance Details: pt able to use RW to  steady herself to step around to chair Gait Pattern: Step-to pattern;Decreased  step length - right;Decreased step length - left Gait velocity: decreased General Gait Details: pt motivated to increase activity and try to walk, but she was fatigued from multiple attempts at sit to stand Stairs: No Wheelchair Mobility Wheelchair Mobility: No    Exercises General Exercises - Lower Extremity Ankle Circles/Pumps: AROM;Both;10 reps Quad Sets: AROM;Both;5 reps;10 reps Short Arc Quad: AROM;Both;10 reps Hip ABduction/ADduction: AAROM;Both;Supine;10 reps Hip Flexion/Marching: AAROM;Both;Supine;10 reps Other Exercises Other Exercises: pt with good effort with all exercise today   PT Diagnosis:    PT Problem List:   PT Treatment Interventions:     PT Goals (current goals can now be found in the care plan section)    Visit Information  Last PT Received On: 04/29/13 History of Present Illness: pt receving tube feedings via panda tube    Subjective Data      Cognition  Cognition Arousal/Alertness: Awake/alert Overall Cognitive Status: Within Functional Limits for tasks assessed    Balance  Balance Balance Assessed: Yes Static Sitting Balance Static Sitting - Balance Support: No upper extremity supported;Feet supported Static Sitting - Level of Assistance: 5: Stand by assistance  End of Session PT - End of Session Equipment Utilized During Treatment: Gait belt Activity Tolerance: Patient limited by fatigue Patient left: in chair;with call bell/phone within reach;with family/visitor present Nurse Communication: Mobility status   GP   Rosey Bath K. [place], [place] [phone] 04/29/2013, 3:25 PM

## 2013-04-29 NOTE — Progress Notes (Addendum)
Pharmacy Consult Note - Phosphorus Replacement  Labs:  Phos 1.5  A/P: KPhos IV given on 9/27 and 9/28 and IV given on 9/29. Will give KPhos 30 mMol IV x 1 dose over 6 hours and recheck level in AM   Pharmacy Follow Up Consult - vancomycin  A/P: Will plan to check a vanc trough this PM prior to 4th dose of vanc since missed a dose on 9/29 due to volume overload. Goal 15-20 mcg/mL. Continue current dosing of vanc for now  Hessie Knows, PharmD, BCPS Pager [phone] 04/29/2013 10:23 AM

## 2013-04-29 NOTE — Progress Notes (Signed)
NUTRITION FOLLOW UP  Intervention:   - Will not advance TF past 2ml/hr of Osmolite 1.2 via panda tube until phosphorus level comes back and is WNL, notified RN - Will continue to monitor closely   Nutrition Dx:   Inadequate oral intake related to ongoing poor appetite and painful mouth sores as evidenced by pt report - ongoing   Goal:   Initiation of enteral nutrition with goal to meet >90% of estimated nutritional needs - not met but advancing slowly   Monitor:   Weights, labs, TF tolerance and advancement  Assessment:   Pt and husband asleep in room. Per discussion with RN, pt tolerating TF well, no c/o nausea, stomach pain/cramping, or diarrhea. Noted refeeding labs with pt showing signs of refeeding with low phosphorus dropping from 2.3 mg/dL to 1.5 mg/dL overnight. Potassium and magnesium WNL. Discussed low phosphorus with MD who gave verbal order for pharmacist to replace. Pt's weight up 1 pound since admission. Pt with 0% meal intake documented yesterday. CBGs elevated, received 3 units of Novolog this morning.   CBG (last 3)   Recent Labs  04/28/13 1715 04/28/13 2101 04/29/13 0735  GLUCAP 120* 155* 221*    Current TF: Osmolite 1.2 at 38ml/hr which provides 864 calories, 40g protein, free water meeting 57% estimated calorie needs, 67% estimated protein needs.     Potassium  Date/Time Value Range Status  04/29/2013  5:00 AM 3.5  3.5 - 5.1 mEq/L Final  04/28/2013  5:00 AM 3.2* 3.5 - 5.1 mEq/L Final  04/27/2013  5:45 AM 3.3* 3.5 - 5.1 mEq/L Final  04/10/2013 12:05 PM 4.1  3.5 - 5.1 mEq/L Final  04/06/2013  4:10 PM 3.9  3.5 - 5.1 mEq/L Final    Phosphorus  Date/Time Value Range Status  04/29/2013  5:00 AM 1.5* 2.3 - 4.6 mg/dL Final  1/61/0960  4:54 AM 2.3  2.3 - 4.6 mg/dL Final  0/98/1191  4:78 AM 1.8* 2.3 - 4.6 mg/dL Final    Magnesium  Date/Time Value Range Status  04/29/2013  5:00 AM 1.6  1.5 - 2.5 mg/dL Final  2/95/6213  0:86 AM 1.6  1.5 - 2.5 mg/dL Final   5/78/4696  2:95 AM 1.6  1.5 - 2.5 mg/dL Final     Height: Ht Readings from Last 1 Encounters:  04/26/13 5' 1.81" (1.57 m)    Weight Status:   Wt Readings from Last 1 Encounters:  04/29/13 185 lb 10 oz (84.2 kg)    Re-estimated needs:  Kcal: 1500-1750  Protein: 60-75g  Fluid: 1.5-1.7L/day   Skin: +2 generalized edema, +2 RUE, LUE edema, + 3 RLE, LLE edema    Diet Order: Dysphagia 3, thin   Intake/Output Summary (Last 24 hours) at 04/29/13 1027 Last data filed at 04/28/13 2116  Gross per 24 hour  Intake      0 ml  Output   1500 ml  Net  -1500 ml    Last BM: 9/30   Labs:   Recent Labs Lab 04/27/13 0545 04/28/13 0500 04/29/13 0500  NA 138 138 137  K 3.3* 3.2* 3.5  CL 105 104 103  CO2 23 23 25   BUN 15 15 16   CREATININE 0.92 0.95 0.96  CALCIUM 7.0* 6.9* 7.0*  MG 1.6 1.6 1.6  PHOS 1.8* 2.3 1.5*  GLUCOSE 69* 64* 176*    CBG (last 3)   Recent Labs  04/28/13 1715 04/28/13 2101 04/29/13 0735  GLUCAP 120* 155* 221*    Scheduled Meds: . acyclovir  500 mg Intravenous Q8H  . allopurinol  300 mg Oral Daily  . antiseptic oral rinse  15 mL Mouth Rinse q12n4p  . antiseptic oral rinse  15 mL Mouth Rinse BID  . aspirin  81 mg Oral Daily  . bisoprolol  10 mg Oral Daily  . dextromethorphan  60 mg Oral BID  . feeding supplement  237 mL Oral TID WC  . filgrastim (NEUPOGEN)  SQ  480 mcg Subcutaneous q1800  . fluconazole (DIFLUCAN) IV  200 mg Intravenous Q24H  . insulin aspart  0-9 Units Subcutaneous TID WC  . meropenem (MERREM) IV  1 g Intravenous Q8H  . multivitamin with minerals  1 tablet Oral Daily  . pantoprazole  40 mg Oral Daily  . potassium phosphate IVPB (mmol)  30 mmol Intravenous Once  . protein supplement  1 scoop Oral TID WC  . vancomycin  750 mg Intravenous Q12H    Continuous Infusions: . 0.9 % NaCl with KCl 40 mEq / L 20 mL/hr (04/28/13 0854)  . feeding supplement (OSMOLITE 1.2 CAL)       Levon Hedger MS, RD, LDN [phone]  Pager [phone] After Hours Pager

## 2013-04-29 NOTE — Discharge Summary (Signed)
Physician Discharge Summary  Tonya Wise ZOX:[id number] DOB: 29-May-1941 DOA: 04/25/2013  PCP: [name], MD  Admit date: 04/25/2013 Discharge date: 04/29/2013  Recommendations for Outpatient Follow-up:   1. Patient has been admitted for neutropenic fever which is likely secondary to sequela of chemotherapy. She was started on board broad-spectrum antibiotics with vancomycin, meropenem and fluconazole. Blood cultures so far show no growth to date. Please taper these antibiotics as final results of blood cultures are available and provided patient remains afebrile.  2. 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 Her mental status is much better. Lumbar puncture was not done due to low platelet count.  3. Patient is on Neupogen 480 mcg daily. She can continue this until white blood cells recover.  4. Due to significant lower extremity edema we gave her one dose of Lasix 40 mg IV today 04/29/2013. Watch for renal function. May receive lasix as needed for lower extremity edema.  5. Continue Panda tube feedings with Osmolite, continuous infusion.  6. Please note that the chemotherapy is now on hold until patient recovers from this acute illnes. Per Dr. Rosie Fate of cancer center pt will need to be re-assessed for future chemotherapy.   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 female with a PMH of recently diagnosed NHL who was admitted on 04/10/2013 with dehydration, acute renal failure, hypercalcemia and rigors. She had a prolonged hospital course complicated by the development of lethargy and confusion. She underwent her first cycle of chemotherapy on 04/18/2013 after placement of a Port-A-Cath. She was slow to progress secondary to deconditioning, anorexia, and oral thrush/HSV with poor oral intake, and it was recommended that she be discharged to a rehabilitation facility however she opted to return home with home health physical therapy. She was alert and felt well at the time of discharge, but within 24 hours developed worsening lethargy, weakness and failure to thrive prompting her husband to bring her back to the hospital. Upon initial evaluation, she had pancytopenia and evidence of extensive herpetic lesions about the mouth. She also developed fever after presentation and was placed on broad-spectrum antibiotics for neutropenic fever. At this time, her nutritional status is extremely poor and she is severely malnourished and so the plan is for placement of a Panda tube to improve her nutritional status.   Assessment/Plan:   Principal Problem:  Neutropenic fever/Lethargy worrisome for herpes encephalitis in a patient with Non-Hodgkin's lymphoma / FTT, recent hypercalcemia, status post chemotherapy  - Port-A-Cath placed 04/18/2013. First cycle of R-CHOP done 04/18/2013. Per oncology chemotherapy for now on hold until patient recovers from this acute illness. - Given immunosuppression and neutropenic fever, she is being treated with high-dose acyclovir as well as empiric vancomycin/Merropenem and Diflucan. Please taper the antibiotics as final blood culture results become available. Preliminary results show no growth to date. Urinalysis negative - Unable to  safely perform LP secondary to significant  thrombocytopenia.  - Continue Neupogen, which was started on 04/25/2013. May stop Neupogen once white blood cell count approaches the normal limit.  Active Problems:  Acute on chronic diastolic heart failure  - 2-D echocardiogram done 04/10/2013, grade 1 diastolic dysfunction noted.  - Developed decompensated heart failure overnight 04/27/2013-04/28/2013. Given Lasix. Subsequently diuresed 2.9 L.  - Patient still has significant lower extremity edema so we will give another dose of Lasix today 05/01/2013. Hypophosphatemia  - Repleted. Monitor daily with initiation of tube feeds as the patient is at high-risk for refeeding syndrome.  Hypomagnesemia  - Repleted.  Hypokalemia  - Replete.  Diabetes / hypoglycemia  - continue sliding scale insulin. 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 Was discharged home on therapy with Valtrex.  - Currently on high-dose acyclovir.  Anorexia / severe protein calorie malnutrition  - Seen by dietitian on 04/13/2013.  - Continue panda tube feeds Grade I diastolic dysfunction  - No current evidence of decompensated heart failure.  Chronic cough  - Chest x-ray negative for pneumonia. Continue Delsym.  Microcytic anemia, chronic  - S/P normal EGD on 03/29/13. BX negative for celiac sprue.  - Likely anemia of chronic disease related to malignancy.  - Given one unit of blood on 04/26/2013 with post transfusion hemoglobin of 8.7 mg/dL.  Elevated LFTs / hepatic steatosis  - Hepatic steatosis noted on CT abdomen/pelvis done 03/30/13.   Code Status: DNR  Family Communication: Husband and daughter updated at bedside.  Disposition Plan: to LTAC today   Medical Consultants:  Dr. Rosie Fate, Oncology Other Consultants:  None. Anti-infectives:  Acyclovir 04/25/2013--->   Vancomycin 04/25/2013--->  Meropenem 04/25/2013--->  Diflucan 04/27/2013--->   Signed:  Manson Passey, MD  [facility] 04/29/2013, 12:20 PM  Pager #: [phone]   Discharge Exam: Filed Vitals:   04/29/13 0538  BP: 128/75  Pulse: 102  Temp: 98 F (36.7 C)  Resp: 20   Filed Vitals:   04/28/13 0437 04/28/13 1334 04/28/13 2100 04/29/13 0538  BP: 131/93 106/70 123/74 128/75  Pulse: 114 98 102 102  Temp: 98.2 F (36.8 C) 97.5 F (36.4 C) 97.6 F (36.4 C) 98 F (36.7 C)  TempSrc: Oral Oral Oral Oral  Resp: 18 16 16 20   Height:      Weight:    84.2 kg (185 lb 10 oz)  SpO2: 95% 94% 95% 97%    General: Pt is alert, follows commands appropriately, not in acute distress Cardiovascular: Regular rate and rhythm, S1/S2 appreciated Respiratory: Clear to auscultation bilaterally, no wheezing Abdominal: Soft, non tender, non distended, bowel sounds +, no guarding; panda tube (+) Extremities: +3 LE pitting edema, no cyanosis, pulses palpable bilaterally DP and PT Neuro: Grossly nonfocal  Discharge Instructions  Discharge Orders   Future Orders Complete By Expires   Call MD for:  difficulty breathing, headache or visual disturbances  As directed    Call MD for:  persistant dizziness or light-headedness  As directed    Call MD for:  persistant nausea and vomiting  As directed    Call MD for:  severe uncontrolled pain  As directed    Diet - low sodium heart healthy  As directed    Discharge instructions  As directed    Comments:  1. Patient has been admitted for neutropenic fever which is likely secondary to sequela of chemotherapy. She was started on board broad-spectrum antibiotics with vancomycin, meropenem and fluconazole. Blood cultures so far show no growth to date. Please taper these antibiotics his final results of blood cultures are available and provided patient remains afebrile. 2. 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 Her  mental status is much better. Lumbar puncture was not done due to low platelet count. 3. Patient is on Neupogen 480 mcg daily. She can continue this until white blood cells recover. 4. Due to significant lower extremity edema we gave her one dose of Lasix 40 mg IV today 04/29/2013. Watch for renal function. May receive lasix as needed for lower extremity edema. 5. Continue Panda tube feedings with Osmolite, continuous infusion. 6. Please note that the chemotherapy is now on hold until patient recovers from this acute illnes. Per Dr. Rosie Fate of cancer center pt will need to be re-assessed for future chemotherapy.   Increase activity slowly  As directed        Medication List    STOP taking these medications       valACYclovir 1000 MG tablet  Commonly known as:  VALTREX      TAKE these medications       0.9 % NaCl with KCl 40 mEq / L 40-0.9 MEQ/L-%  Inject 10-20 mL/hr into the vein continuous.     acetaminophen 325 MG tablet  Commonly known as:  TYLENOL  Take 2 tablets (650 mg total) by mouth every 4 (four) hours as needed for pain or fever.     acetaminophen 650 MG suppository  Commonly known as:  TYLENOL  Place 1 suppository (650 mg total) rectally every 4 (four) hours as needed.     albuterol (5 MG/ML) 0.5% nebulizer solution  Commonly known as:  PROVENTIL  Take 0.5 mLs (2.5 mg total) by nebulization every 4 (four) hours as needed for wheezing or shortness of breath.     allopurinol 300 MG tablet  Commonly known as:  ZYLOPRIM  Take 1 tablet (300 mg total) by mouth daily.     alum & mag hydroxide-simeth 200-200-20 MG/5ML suspension  Commonly known as:  MAALOX/MYLANTA  Take 30 mLs by mouth every 6 (six) hours as needed.     antiseptic oral rinse Liqd  15 mLs by Mouth Rinse route 2 times daily at 12 noon and 4 pm.     aspirin 81 MG tablet  Take 1 tablet (81 mg total) by mouth daily.     bisoprolol 10 MG tablet  Commonly known as:  ZEBETA  Take 1 tablet (10 mg total) by  mouth daily.     dextromethorphan 30 MG/5ML liquid  Commonly known as:  DELSYM  Take 10 mLs (60 mg total) by mouth 2 (two) times daily.     dextrose 5 % SOLN 100 mL with acyclovir 50 MG/ML SOLN 500 mg  Inject 500 mg into the vein every 8 (eight) hours.     feeding supplement Liqd  Take 237 mLs by mouth 3 (three) times daily with meals.     feeding supplement (OSMOLITE 1.2 CAL) Liqd  Place 1,000 mLs into feeding tube continuous.     filgrastim 480 MCG/1.6ML injection  Commonly known as:  NEUPOGEN  Inject 1.6 mLs (480 mcg total) into the skin daily at 6 PM.     fluconazole 200-0.9 MG/100ML-% IVPB  Commonly known as:  DIFLUCAN  Inject  100 mLs (200 mg total) into the vein daily.     insulin aspart 100 UNIT/ML injection  Commonly known as:  novoLOG  Inject 0-9 Units into the skin 3 (three) times daily with meals.     Insulin Detemir 100 UNIT/ML Sopn  Commonly known as:  LEVEMIR FLEXPEN  Inject 20 Units into the skin at bedtime.     morphine 2 MG/ML injection  Inject 1 mL (2 mg total) into the vein every 2 (two) hours as needed.     multivitamin with minerals Tabs tablet  Take 1 tablet by mouth daily.     omeprazole 40 MG capsule  Commonly known as:  PRILOSEC  Take 1 capsule (40 mg total) by mouth daily.     ondansetron 4 MG tablet  Commonly known as:  ZOFRAN  Take 1 tablet (4 mg total) by mouth every 6 (six) hours as needed for nausea.     ondansetron 4 MG/2ML Soln injection  Commonly known as:  ZOFRAN  Inject 2 mLs (4 mg total) into the vein every 6 (six) hours as needed for nausea.     oxyCODONE 5 MG immediate release tablet  Commonly known as:  Oxy IR/ROXICODONE  Take 1 tablet (5 mg total) by mouth every 6 (six) hours as needed.     protein supplement 6 g Powd  Commonly known as:  RESOURCE BENEPROTEIN  Take 1 scoop (6 g total) by mouth 3 (three) times daily with meals.     sodium chloride 0.9 % SOLN 100 mL with meropenem 1 G SOLR 1 g  Inject 1 g into the vein  every 8 (eight) hours.     Vancomycin 750 MG/150ML Soln  Commonly known as:  VANCOCIN  Inject 150 mLs (750 mg total) into the vein every 12 (twelve) hours.           Follow-up Information   Follow up with [name], MD. Schedule an appointment as soon as possible for a visit in 2 weeks.   Specialty:  Family Medicine   Contact information:   [address] [place] [address] [phone]        The results of significant diagnostics from this hospitalization (including imaging, microbiology, ancillary and laboratory) are listed below for reference.    Significant Diagnostic Studies: Dg Chest 1 View  04/15/2013   CLINICAL DATA:  Fevers  EXAM: CHEST - 1 VIEW  COMPARISON:  04/10/2013  FINDINGS: Stable cardiac silhouette. There is increased bandlike densities in the right lower lobe. Linear thickening at the left lung base additionally. No pneumothorax. Lungs are hyperinflated.  IMPRESSION: Bibasilar linear bandlike opacities are concerning for pneumonia.   Electronically Signed   By: Genevive Bi M.D.   On: 04/15/2013 15:49   X-ray Chest Pa And Lateral   04/10/2013   CLINICAL DATA:  72 year old female with cough and weakness. Lymphoma.  EXAM: CHEST  2 VIEW  COMPARISON:  Chest CT 03/30/2013 and earlier.  FINDINGS: Stable prominent right hilum. See recent CT report. Other mediastinal contours are within normal limits. Visualized tracheal air column is within normal limits. No pneumothorax, pulmonary edema, pleural effusion or confluent pulmonary opacity. No acute osseous abnormality identified.  IMPRESSION: No new cardiopulmonary abnormality. Hilar lymphadenopathy, see 03/30/2013 chest CT.   Electronically Signed   By: Augusto Gamble M.D.   On: 04/10/2013 17:51   Dg Abd 1 View  04/28/2013   CLINICAL DATA:  Panda  tube placement  EXAM: ABDOMEN - 1 VIEW  COMPARISON:  CT 03/30/2013  FINDINGS: Panda tube is in the region of the pylorus. Negative for bowel obstruction. The  patient was moving during the exposure.  IMPRESSION: Panda tube tip near the pylorus.   Electronically Signed   By: Marlan Palau M.D.   On: 04/28/2013 11:48   Ct Head Wo Contrast  04/14/2013   *RADIOLOGY REPORT*  Clinical Data: Lymphoma.  Lethargy  CT HEAD WITHOUT CONTRAST  Technique:  Contiguous axial images were obtained from the base of the skull through the vertex without contrast.  Comparison: MRI 11/26/2004.  CT 11/25/2004  Findings: Mild atrophy.  Negative for hydrocephalus.  Mild hypodensity in the periventricular white matter similar to the prior study.  No acute infarct.  Negative for hemorrhage or mass.  IMPRESSION: Mild atrophy and mild chronic changes in the white matter.  No acute abnormality.   Original Report Authenticated By: Janeece Riggers, M.D.   Ct Chest W Contrast  03/30/2013   CLINICAL DATA:  72 year old female with fever of unknown origin, anemia, blood loss of unknown etiology, possible malignancy, renal insufficiency, hypertension.  EXAM: CT CHEST, ABDOMEN, AND PELVIS WITH CONTRAST  TECHNIQUE: Multidetector CT imaging of the chest, abdomen and pelvis was performed following the standard protocol during bolus administration of intravenous contrast.  CONTRAST:  OMNIPAQUE IOHEXOL 300 MG/ML  SOLN  COMPARISON:  Chest radiographs 03/28/2013 and earlier. Abdominal radiographs 11/25/2004.  FINDINGS: CT CHEST FINDINGS  Major airways are patent. No pericardial or pleural effusion. In the posterior right upper lobe there is mild reticular nodular Peribronchovascular opacity which appears to be inflammatory (series 3, image 17). Otherwise, the lungs are clear.  No acute or suspicious osseous abnormality identified in the chest.  Incidental breast implants.  Negative visualized thoracic inlet. However, there is hilar and mediastinal lymphadenopathy. Right peritracheal node measures up to 15 mm short axis. Hilar nodes measure up to 20 mm individually right greater than left. Bulky subcarinal  lymphadenopathy, measuring up to 22 mm. There is also a right greater than left Mild axillary lymphadenopathy, with individual nodes measuring up tip 20 mm short axis. No breast soft tissue mass identified.  Major mediastinal vascular structures remain patent.  CT ABDOMEN AND PELVIS FINDINGS  Splenomegaly with permeative/miliary hypodensity scattered in the splenic parenchyma. Bulky right greater than left retroperitoneal lymphadenopathy, with AE right aortocaval nodal conglomeration measuring up to 36 mm short axis. Less pronounced splenic hilum, gastrohepatic ligament, and porta hepatis lymphadenopathy. Retroperitoneal lymphadenopathy continues into the pelvis. Left iliac bifurcation node measures 23 mm short axis. Right inguinal rounded lymph node measures 24 mm short axis. Smaller round inguinal lymph nodes on the left. Abdominal mesenteric nodes also appear enlarged measuring up to 12 mm short axis.  Trace pelvic free fluid. No abdominal free fluid. Oral contrast has reached the rectum. Uterus and adnexal within normal limits. Unremarkable bladder. Redundant left colon. Negative right colon and appendix. No dilated small bowel. Decompressed stomach. Negative pancreas, adrenal glands, kidneys, and gallbladder. Low density throughout the liver suggesting steatosis. Major arterial structures in the abdomen and pelvis are patent. Portal venous system within normal limits.  Sequelae of L3-L4 and L4-L5 fusion. 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 No acute or suspicious osseous lesion in the abdomen or pelvis.  IMPRESSION: CT CHEST IMPRESSION  1. Mediastinal, hilar, and axillary lymphadenopathy, in conjunction with the abdomen and pelvis findings, most compatible with non-Hodgkin's lymphoma.  2. Trace abnormal  lung opacity in the posterior right upper lobe may reflect mild acute infection or chronic  postinflammatory change.  CT ABDOMEN AND PELVIS IMPRESSION  1. 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  2. Abdominal and pelvic lymphadenopathy maximal in the retroperitoneum and right inguinal nodal stations.  3. 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  4. Hepatic steatosis.   Electronically Signed   By: Augusto Gamble   On: 03/30/2013 20:31   Ct Abdomen Pelvis W Contrast  03/30/2013   CLINICAL DATA:  72 year old female with fever of unknown origin, anemia, blood loss of unknown etiology, possible malignancy, renal insufficiency, hypertension.  EXAM: CT CHEST, ABDOMEN, AND PELVIS WITH CONTRAST  TECHNIQUE: Multidetector CT imaging of the chest, abdomen and pelvis was performed following the standard protocol during bolus administration of intravenous contrast.  CONTRAST:  OMNIPAQUE IOHEXOL 300 MG/ML  SOLN  COMPARISON:  Chest radiographs 03/28/2013 and earlier. Abdominal radiographs 11/25/2004.  FINDINGS: CT CHEST FINDINGS  Major airways are patent. No pericardial or pleural effusion. In the posterior right upper lobe there is mild reticular nodular Peribronchovascular opacity which appears to be inflammatory (series 3, image 17). Otherwise, the lungs are clear.  No acute or suspicious osseous abnormality identified in the chest.  Incidental breast implants.  Negative visualized thoracic inlet. However, there is hilar and mediastinal lymphadenopathy. Right peritracheal node measures up to 15 mm short axis. Hilar nodes measure up to 20 mm individually right greater than left. Bulky subcarinal lymphadenopathy, measuring up to 22 mm. There is also a right greater than left Mild axillary lymphadenopathy, with individual nodes measuring up tip 20 mm short axis. No breast soft tissue mass identified.  Major mediastinal vascular structures remain patent.  CT  ABDOMEN AND PELVIS FINDINGS  Splenomegaly with permeative/miliary hypodensity scattered in the splenic parenchyma. Bulky right greater than left retroperitoneal lymphadenopathy, with AE right aortocaval nodal conglomeration measuring up to 36 mm short axis. Less pronounced splenic hilum, gastrohepatic ligament, and porta hepatis lymphadenopathy. Retroperitoneal lymphadenopathy continues into the pelvis. Left iliac bifurcation node measures 23 mm short axis. Right inguinal rounded lymph node measures 24 mm short axis. Smaller round inguinal lymph nodes on the left. Abdominal mesenteric nodes also appear enlarged measuring up to 12 mm short axis.  Trace pelvic free fluid. No abdominal free fluid. Oral contrast has reached the rectum. Uterus and adnexal within normal limits. Unremarkable bladder. Redundant left colon. Negative right colon and appendix. No dilated small bowel. Decompressed stomach. Negative pancreas, adrenal glands, kidneys, and gallbladder. Low density throughout the liver suggesting steatosis. Major arterial structures in the abdomen and pelvis are patent. Portal venous system within normal limits.  Sequelae of L3-L4 and L4-L5 fusion. 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 No acute or suspicious osseous lesion in the abdomen or pelvis.  IMPRESSION: CT CHEST IMPRESSION  1. Mediastinal, hilar, and axillary lymphadenopathy, in conjunction with the abdomen and pelvis findings, most compatible with non-Hodgkin's lymphoma.  2. Trace abnormal lung opacity in the posterior right upper lobe may reflect mild acute infection or chronic postinflammatory change.  CT ABDOMEN AND PELVIS IMPRESSION  1. 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  2. Abdominal and pelvic lymphadenopathy maximal in the retroperitoneum and right inguinal nodal stations.  3. Previous L3 to L5 level  spinal fusion  with advanced adjacent segment disease at L2-L3 and L5-S1, including spondylolisthesis and multifactorial spinal stenosis at both levels.  4. Hepatic steatosis.   Electronically Signed   By: Augusto Gamble   On: 03/30/2013 20:31   Ir Fluoro Guide Cv Line Right  04/18/2013   CLINICAL DATA:  Cancer  EXAM: TUNNEL POWER PORT PLACEMENT WITH SUBCUTANEOUS POCKET UTILIZING ULTRASOUND & FLOUROSCOPY  MEDICATIONS AND MEDICAL HISTORY: Versed 2.0 mg, Fentanyl 100 mcg.  Vancomycin was given within two hours of incision. Vancomycin was given due to an antibiotic allergy.  ANESTHESIA/SEDATION: Moderate sedation time: 25 minutes  CONTRAST:  None  FLUOROSCOPY TIME:  1 min and 6 seconds.  PROCEDURE: After written informed consent was obtained, patient was placed in the supine position on angiographic table. The right neck and chest was prepped and draped in a sterile fashion. Lidocaine was utilized for local anesthesia. The right internal jugular vein was noted to be patent initially with ultrasound. Under sonographic guidance, a micropuncture needle was inserted into the right IJ vein (Ultrasound and fluoroscopic image documentation was performed). The needle was removed over an 018 wire which was exchanged for a Amplatz. This was advanced into the IVC. An 8-French dilator was advanced over the Amplatz.  A small incision was made in the right upper chest over the anterior right second rib. Utilizing blunt dissection, a subcutaneous pocket was created in the caudal direction. The pocket was irrigated with a copious amount of sterile normal saline. The port catheter was tunneled from the chest incision, and out the neck incision. The reservoir was inserted into the subcutaneous pocket and secured with two 3-0 Ethilon stitches. A peel-away sheath was advanced over the Amplatz wire. The port catheter was cut to measure length and inserted through the peel-away sheath. The peel-away sheath was removed. The chest incision was  closed with 3-0 Vicryl interrupted stitches for the subcutaneous tissue and a running of 4-0 Vicryl subcuticular stitch for the skin. The neck incision was closed with a 4-0 Vicryl subcuticular stitch. Derma-bond was applied to both surgical incisions. The port reservoir was flushed and instilled with heparinized saline. No complications.  FINDINGS: A right IJ vein Port-A-Cath is in place with its tip at the cavoatrial junction.  IMPRESSION: Successful 8 French right internal jugular vein power port placement with its tip at the SVC/RA junction.   Electronically Signed   By: Maryclare Bean M.D.   On: 04/18/2013 09:39   Ir US Guide Vasc Access Right  04/18/2013   CLINICAL DATA:  Cancer  EXAM: TUNNEL POWER PORT PLACEMENT WITH SUBCUTANEOUS POCKET UTILIZING ULTRASOUND & FLOUROSCOPY  MEDICATIONS AND MEDICAL HISTORY: Versed 2.0 mg, Fentanyl 100 mcg.  Vancomycin was given within two hours of incision. Vancomycin was given due to an antibiotic allergy.  ANESTHESIA/SEDATION: Moderate sedation time: 25 minutes  CONTRAST:  None  FLUOROSCOPY TIME:  1 min and 6 seconds.  PROCEDURE: After written informed consent was obtained, patient was placed in the supine position on angiographic table. The right neck and chest was prepped and draped in a sterile fashion. Lidocaine was utilized for local anesthesia. The right internal jugular vein was noted to be patent initially with ultrasound. Under sonographic guidance, a micropuncture needle was inserted into the right IJ vein (Ultrasound and fluoroscopic image documentation was performed). The needle was removed over an 018 wire which was exchanged for a Amplatz. This was advanced into the IVC. An 8-French dilator was advanced over the Amplatz.  A small incision was made in  the right upper chest over the anterior right second rib. Utilizing blunt dissection, a subcutaneous pocket was created in the caudal direction. The pocket was irrigated with a copious amount of sterile normal saline.  The port catheter was tunneled from the chest incision, and out the neck incision. The reservoir was inserted into the subcutaneous pocket and secured with two 3-0 Ethilon stitches. A peel-away sheath was advanced over the Amplatz wire. The port catheter was cut to measure length and inserted through the peel-away sheath. The peel-away sheath was removed. The chest incision was closed with 3-0 Vicryl interrupted stitches for the subcutaneous tissue and a running of 4-0 Vicryl subcuticular stitch for the skin. The neck incision was closed with a 4-0 Vicryl subcuticular stitch. Derma-bond was applied to both surgical incisions. The port reservoir was flushed and instilled with heparinized saline. No complications.  FINDINGS: A right IJ vein Port-A-Cath is in place with its tip at the cavoatrial junction.  IMPRESSION: Successful 8 French right internal jugular vein power port placement with its tip at the SVC/RA junction.   Electronically Signed   By: Maryclare Bean M.D.   On: 04/18/2013 09:39   Ct Biopsy  04/15/2013   *RADIOLOGY REPORT*  Indication: New diagnosis of non-Hodgkin's lymphoma.  CT GUIDED LEFT ILIAC BONE MARROW ASPIRATION AND BONE MARROW CORE BIOPSIES  Intravenous medications: Fentanyl 50 mcg IV; Versed 0.5 mg IV  Sedation time: 10 minutes  Contrast volume: None  Complications: None immediate  PROCEDURE/FINDINGS:  Informed consent was obtained from the patient following an explanation of the procedure, risks, benefits and alternatives. The patient understands, agrees and consents for the procedure. All questions were addressed.  A time out was performed prior to the initiation of the procedure.  The patient was positioned prone and noncontrast localization CT was performed of the pelvis to demonstrate the iliac marrow spaces.  The operative site was prepped and draped in the usual sterile fashion.  Under sterile conditions and local anesthesia, an 11 gauge coaxial bone biopsy needle was advanced into the  left iliac marrow space. Needle position was confirmed with CT imaging.  Initially, bone marrow aspiration was performed. Next, a bone marrow biopsy was obtained with the 11 gauge outer bone marrow device. The 11 gauge coaxial bone biopsy needle was readvanced into a slightly different location within the left iliac marrow space, positioning was confirmed and an additional bone marrow biopsy was obtained. Samples were prepared with the cytotechnologist and deemed adequate.  The needle was removed intact.  Hemostasis was obtained with compression and a dressing was placed. The patient tolerated the procedure well without immediate post procedural complication.  IMPRESSION:  Successful CT guided left iliac bone marrow aspiration and core biopsies.   Original Report Authenticated By: Tacey Ruiz, MD   Portable Chest 1 View  04/25/2013   CLINICAL DATA:  Chest pain. Weakness.  EXAM: PORTABLE CHEST - 1 VIEW  COMPARISON:  04/15/2013  FINDINGS: Lung base opacity seen on the prior study has improved most evident on the right. Some opacity persists that is likely scarring, atelectasis or a combination. A component of pneumonia is possible.  The lungs are otherwise clear.  No pleural effusion or pneumothorax.  The heart, mediastinum and hila are unremarkable.  New right anterior chest wall power Port-A-Cath has its tip in the lower superior vena cava.  IMPRESSION: Improved lung base opacities. This may be improved atelectasis, infiltrate or a combination. No new lung opacities.  New right anterior chest wall  power Port-A-Cath is well positioned. No pneumothorax.   Electronically Signed   By: Amie Portland   On: 04/25/2013 14:34    Microbiology: Recent Results (from the past 240 hour(s))  CULTURE, BLOOD (ROUTINE X 2)     Status: None   Collection Time    04/25/13  1:57 PM      Result Value Range Status   Specimen Description PORTA CATH LINE, CENTRA   Final   Special Requests BOTTLES DRAWN AEROBIC AND ANAEROBIC 5CC  EACH   Final   Culture  Setup Time     Final   Value: 04/25/2013 21:22     Performed at [facility]   Culture     Final   Value:        BLOOD CULTURE RECEIVED NO GROWTH TO DATE CULTURE WILL BE HELD FOR 5 DAYS BEFORE ISSUING A FINAL NEGATIVE REPORT     Performed at [facility]   Report Status PENDING   Incomplete  CULTURE, BLOOD (ROUTINE X 2)     Status: None   Collection Time    04/25/13  2:25 PM      Result Value Range Status   Specimen Description BLOOD LEFT ARM   Final   Special Requests BOTTLES DRAWN AEROBIC AND ANAEROBIC 4.5CC EACH   Final   Culture  Setup Time     Final   Value: 04/25/2013 21:21     Performed at [facility]   Culture     Final   Value:        BLOOD CULTURE RECEIVED NO GROWTH TO DATE CULTURE WILL BE HELD FOR 5 DAYS BEFORE ISSUING A FINAL NEGATIVE REPORT     Performed at [facility]   Report Status PENDING   Incomplete  VIRAL CULTURE VIRC     Status: None   Collection Time    04/26/13  9:07 AM      Result Value Range Status   Specimen Description MOUTH   Final   Special Requests Immunocompromised   Final   Culture     Final   Value: Culture has been initiated.     Performed at [facility]   Report Status PENDING   Incomplete     Labs: Basic Metabolic Panel:  Recent Labs Lab 04/25/13 1410 04/26/13 0610 04/27/13 0545 04/28/13 0500 04/29/13 0500  NA 134* 137 138 138 137  K 3.5 3.3* 3.3* 3.2* 3.5  CL 102 105 105 104 103  CO2 23 24 23 23 25   GLUCOSE 135* 112* 69* 64* 176*  BUN 23 17 15 15 16   CREATININE 1.00 0.91 0.92 0.95 0.96  CALCIUM 7.7* 7.1* 7.0* 6.9* 7.0*  MG 1.4* 1.7 1.6 1.6 1.6  PHOS 2.0* 2.1* 1.8* 2.3 1.5*   Liver Function Tests:  Recent Labs Lab 04/23/13 0500 04/25/13 1410  AST 25 35  ALT 12 16  ALKPHOS 121* 154*  BILITOT 1.2 1.0  PROT 4.1* 4.0*  ALBUMIN 1.3* 1.5*   No results found for this basename: LIPASE, AMYLASE,  in the last 168 hours No results found for this basename:  AMMONIA,  in the last 168 hours CBC:  Recent Labs Lab 04/25/13 1410 04/26/13 0610 04/27/13 0545 04/28/13 0500 04/29/13 0500  WBC 0.7* 0.5* 0.4* 0.8* 1.7*  NEUTROABS 0.4* 0.3* 0.1* 0.2* 0.9*  HGB 6.9* 6.2* 8.9* 8.9* 8.4*  HCT 21.1* 18.8* 26.7* 26.9* 26.0*  MCV 81.5 81.0 79.2 80.1 81.8  PLT 15* 17* 32* 68*  76*   Cardiac Enzymes: No results found for this basename: CKTOTAL, CKMB, CKMBINDEX, TROPONINI,  in the last 168 hours BNP: BNP (last 3 results)  Recent Labs  04/22/13 0530  PROBNP 16301.0*   CBG:  Recent Labs Lab 04/28/13 0819 04/28/13 1247 04/28/13 1715 04/28/13 2101 04/29/13 0735  GLUCAP 99 103* 120* 155* 221*    Time coordinating discharge: Over 30 minutes

## 2013-04-29 NOTE — Progress Notes (Signed)
Report called to Aurea Graff, Charity fundraiser at [facility].

## 2013-04-29 NOTE — Progress Notes (Signed)
CSW received notification from [facility] that pt discharging to [facility] today.   RNCM assisted with pt discharge needs. [facility] to provide transportation.  No further social work needs identified at this time.  CSW signing off.   Jacklynn Lewis, MSW, LCSWA  Clinical Social Work [phone]

## 2013-04-29 NOTE — Care Management Note (Signed)
Cm received call from admission rep at [facility] providing CM with assigned MD and room for pt. Pt will transfer to [facility], accepting Md is Dr.Crowley, room #215. RN Selena Batten.O notified to call report to [phone]. [facility] to provide pt transportation to [facility]. [facility] form placed in [place]. RN instructed to provide this form with the EMS transfer report to [facility].    Roxy Manns Cheryll Keisler,RN,MSN [phone]

## 2013-04-29 NOTE — Progress Notes (Signed)
OT Cancellation Note  Patient Details Name: Tonya Wise MRN: [id number] DOB: October 18, 1940   Cancelled Treatment:     Pt not seen for acute OT today secondary to d/c summary in chart. Per chart review, plan is for pt d/c today. Will sign off acute OT at this time.   Tonya Wise 04/29/2013, 12:46 PM

## 2013-04-30 ENCOUNTER — Other Ambulatory Visit: Payer: Self-pay | Admitting: Internal Medicine

## 2013-04-30 DIAGNOSIS — C8590 Non-Hodgkin lymphoma, unspecified, unspecified site: Secondary | ICD-10-CM

## 2013-04-30 LAB — CMV IGM: CMV IgM: <8 AU/mL (ref <30.00)

## 2013-04-30 LAB — GLUCOSE, CAPILLARY: Glucose-Capillary: 169 mg/dL — ABNORMAL HIGH (ref 70–99)

## 2013-05-01 LAB — CULTURE, BLOOD (ROUTINE X 2): Culture: NO GROWTH

## 2013-05-04 LAB — VIRAL CULTURE VIRC: Culture: DETECTED

## 2013-05-05 ENCOUNTER — Telehealth: Payer: Self-pay | Admitting: Internal Medicine

## 2013-05-11 ENCOUNTER — Telehealth: Payer: Self-pay

## 2013-05-11 NOTE — Telephone Encounter (Signed)
S/w Isatu RN, Pt is at [facility] on [address].  Pt is under Dr Crowley's care. Pager number is [phone]. This information given to Dr Rosie Fate for f/u on R-CHOP regimen initiated while pt in [facility].

## 2013-05-12 ENCOUNTER — Other Ambulatory Visit: Payer: Self-pay | Admitting: Internal Medicine

## 2013-05-12 DIAGNOSIS — C8590 Non-Hodgkin lymphoma, unspecified, unspecified site: Secondary | ICD-10-CM

## 2013-05-12 LAB — CMV (CYTOMEGALOVIRUS) DNA ULTRAQUANT, PCR: CMV DNA Quant: 260 copies/mL — ABNORMAL HIGH (ref <200)

## 2013-05-13 ENCOUNTER — Other Ambulatory Visit: Payer: Medicare Other | Admitting: Lab

## 2013-05-13 ENCOUNTER — Ambulatory Visit: Payer: Medicare Other

## 2013-05-14 ENCOUNTER — Telehealth: Payer: Self-pay | Admitting: Internal Medicine

## 2013-05-14 ENCOUNTER — Encounter: Payer: Self-pay | Admitting: *Deleted

## 2013-05-14 NOTE — Telephone Encounter (Signed)
, °

## 2013-05-14 NOTE — Telephone Encounter (Signed)
Koleen Nimrod from [facility] lmonvm to cx 10/17 appts per Dr. Rosann Auerbach. Message left for desk nurse.

## 2013-05-15 ENCOUNTER — Ambulatory Visit: Payer: Medicare Other

## 2013-05-15 ENCOUNTER — Other Ambulatory Visit: Payer: Medicare Other | Admitting: Lab

## 2013-05-18 ENCOUNTER — Telehealth: Payer: Self-pay | Admitting: Internal Medicine

## 2013-05-18 NOTE — Telephone Encounter (Signed)
I spoke with Tonya Wise's husband, [name] at 1:29 pm this afternoon regarding her disposition from [facility].  He reports she will likely be discharged sometime this week.  He is hoping to take her home from there.  He was instructed to call our offices for a follow-up appointment.

## 2013-05-20 ENCOUNTER — Other Ambulatory Visit: Payer: Self-pay

## 2013-05-20 DIAGNOSIS — D649 Anemia, unspecified: Secondary | ICD-10-CM | POA: Diagnosis not present

## 2013-05-20 DIAGNOSIS — I129 Hypertensive chronic kidney disease with stage 1 through stage 4 chronic kidney disease, or unspecified chronic kidney disease: Secondary | ICD-10-CM | POA: Diagnosis not present

## 2013-05-20 DIAGNOSIS — Z48817 Encounter for surgical aftercare following surgery on the skin and subcutaneous tissue: Secondary | ICD-10-CM | POA: Diagnosis not present

## 2013-05-20 DIAGNOSIS — R599 Enlarged lymph nodes, unspecified: Secondary | ICD-10-CM | POA: Diagnosis not present

## 2013-05-21 ENCOUNTER — Telehealth: Payer: Self-pay | Admitting: Internal Medicine

## 2013-05-21 NOTE — Telephone Encounter (Signed)
Started on insulin at [facility]. (detemir 20 units at bedtime).  She has no history of diabetes, but it was started related to her chemo (and steroid use). On discharge from [facility], they didn't list it on her medication list.  She had been getting insulin injections while in the hospital.  He is calling to ask if he is supposed to be giving the injections, and what dose.  She is no longer on steroids.  Sugar last night was 209, 201 this morning.  Discussed with husband--continue to monitor blood sugars.  Likely they will gradually come down on their own if she is no longer receiving the steroids. If sugars are higher (250-300) and not declining as expected, than she will need medication.  I recommended touching base with the oncologist who is more familiar with the chemo received, when her last dose was, and how long it takes for sugars to come down/treatment with insulin.  We have no records available from [facility] at this time.

## 2013-05-21 NOTE — Telephone Encounter (Signed)
Pt husband was calling on behalf of pt stating that she just got discharged from [facility] from rehab and he would like you to call him to do a follow-up call. She is very weak and said if may can bring her in if he had help if you needed to see her. But he did have a question about a medication she is on.

## 2013-05-22 ENCOUNTER — Telehealth: Payer: Self-pay | Admitting: Family Medicine

## 2013-05-22 ENCOUNTER — Telehealth: Payer: Self-pay

## 2013-05-22 DIAGNOSIS — I129 Hypertensive chronic kidney disease with stage 1 through stage 4 chronic kidney disease, or unspecified chronic kidney disease: Secondary | ICD-10-CM | POA: Diagnosis not present

## 2013-05-22 DIAGNOSIS — Z48817 Encounter for surgical aftercare following surgery on the skin and subcutaneous tissue: Secondary | ICD-10-CM | POA: Diagnosis not present

## 2013-05-22 DIAGNOSIS — D649 Anemia, unspecified: Secondary | ICD-10-CM | POA: Diagnosis not present

## 2013-05-22 DIAGNOSIS — R599 Enlarged lymph nodes, unspecified: Secondary | ICD-10-CM | POA: Diagnosis not present

## 2013-05-22 MED ORDER — BISOPROLOL FUMARATE 10 MG PO TABS: 10.0000 mg | ORAL_TABLET | Freq: Every day | ORAL | Status: DC

## 2013-05-22 NOTE — Telephone Encounter (Signed)
S/w Dr Rosie Fate and called pt back. S/w husband. He is to not give any insulin at present. He is to monitor sugars, if they stay the same or trend upward he is to call Dr Lynelle Doctor and or Dr Rosie Fate. We will try to reschedule her appt to sooner on 10/29. From that appt Dr Rosie Fate will determine chemo regimen. Pt spoke back these instructions.

## 2013-05-22 NOTE — Telephone Encounter (Signed)
Pt has appt with Dr Rosie Fate on 11/5 but they are trying to get pt in next week for sooner appt

## 2013-05-22 NOTE — Telephone Encounter (Signed)
Spoke with Triage at Dr Benjiman Core office as Tonya Wise suggested to see if they would like to handle talking to pt, husband and another family member or if they would like for [place] to proceed with handling this. Triage is going to discuss this with nurse/doctor and call us back.

## 2013-05-22 NOTE — Telephone Encounter (Signed)
Told Tonya Wise that Mr. Downum understands that he is to hold his wife's insulin per Dr. Rosie Fate and to call Dr. Lynelle Doctor and or Dr. Rosie Fate if Blood sugars > 250 for instructions. He has no other questions regarding medications at this time and appointment with Dr. Rosie Fate trying to be rescheduled to 05-27-13 ans noted by Garald Braver at 1037 am today.

## 2013-05-22 NOTE — Telephone Encounter (Signed)
done

## 2013-05-22 NOTE — Telephone Encounter (Signed)
Husband called with 2 concerns.  First: Tonya Wise was in [facility] and started on insulin. She went to [facility] and they continued her on insulin. She was dc from [facility] on Tues 10/21 with no insulin orders. Husband has not given her insulin since dc. Her sugar thurs am was 201 and fri am 175. He called Dr Lynelle Doctor and Dr Lynelle Doctor defered him to Dr Rosie Fate. He needs to know what to do. Second: Tonya Wise feels she is ready to resume chemo treatment but she is not sure on what the program will look like at this time. The chemo did effect her strongly. She wants to talk with Dr Rosie Fate. Her f/u appt is 11/5 for labs and MD visit but no chemo scheduled. This information forwarded to Dr Rosie Fate.

## 2013-05-25 ENCOUNTER — Telehealth: Payer: Self-pay | Admitting: Family Medicine

## 2013-05-25 DIAGNOSIS — R599 Enlarged lymph nodes, unspecified: Secondary | ICD-10-CM | POA: Diagnosis not present

## 2013-05-25 DIAGNOSIS — I129 Hypertensive chronic kidney disease with stage 1 through stage 4 chronic kidney disease, or unspecified chronic kidney disease: Secondary | ICD-10-CM | POA: Diagnosis not present

## 2013-05-25 DIAGNOSIS — D649 Anemia, unspecified: Secondary | ICD-10-CM | POA: Diagnosis not present

## 2013-05-25 DIAGNOSIS — Z48817 Encounter for surgical aftercare following surgery on the skin and subcutaneous tissue: Secondary | ICD-10-CM | POA: Diagnosis not present

## 2013-05-25 NOTE — Telephone Encounter (Signed)
Called therapist to let her know that Dr Susann Givens wrote script authorizing to continue PT and OT. LM to call back if she need the script to be faxed or if she needed anything else.

## 2013-05-25 NOTE — Telephone Encounter (Signed)
I wrote a prescription

## 2013-05-25 NOTE — Telephone Encounter (Signed)
I THINK THIS COMES TO YOU

## 2013-05-26 ENCOUNTER — Telehealth: Payer: Self-pay | Admitting: Internal Medicine

## 2013-05-26 ENCOUNTER — Other Ambulatory Visit: Payer: Self-pay

## 2013-05-26 ENCOUNTER — Telehealth: Payer: Self-pay

## 2013-05-26 NOTE — Telephone Encounter (Signed)
S/w Denna that her appt is at 930 am for lab and 10 am for Dr Rosie Fate tomorrow. Pt restated the times.

## 2013-05-26 NOTE — Telephone Encounter (Signed)
Talked to pt and gave her appt for lab and MD for 05/27/13 °

## 2013-05-27 ENCOUNTER — Telehealth: Payer: Self-pay | Admitting: *Deleted

## 2013-05-27 ENCOUNTER — Ambulatory Visit (HOSPITAL_BASED_OUTPATIENT_CLINIC_OR_DEPARTMENT_OTHER): Payer: Medicare Other | Admitting: Internal Medicine

## 2013-05-27 ENCOUNTER — Encounter: Payer: Self-pay | Admitting: Internal Medicine

## 2013-05-27 ENCOUNTER — Other Ambulatory Visit (HOSPITAL_BASED_OUTPATIENT_CLINIC_OR_DEPARTMENT_OTHER): Payer: Medicare Other | Admitting: Lab

## 2013-05-27 VITALS — BP 119/76 | HR 93 | Temp 98.4°F | Resp 18 | Ht 61.0 in

## 2013-05-27 DIAGNOSIS — C8590 Non-Hodgkin lymphoma, unspecified, unspecified site: Secondary | ICD-10-CM

## 2013-05-27 DIAGNOSIS — E119 Type 2 diabetes mellitus without complications: Secondary | ICD-10-CM

## 2013-05-27 DIAGNOSIS — B370 Candidal stomatitis: Secondary | ICD-10-CM

## 2013-05-27 DIAGNOSIS — R6883 Chills (without fever): Secondary | ICD-10-CM | POA: Diagnosis not present

## 2013-05-27 DIAGNOSIS — E43 Unspecified severe protein-calorie malnutrition: Secondary | ICD-10-CM

## 2013-05-27 DIAGNOSIS — D61810 Antineoplastic chemotherapy induced pancytopenia: Secondary | ICD-10-CM

## 2013-05-27 DIAGNOSIS — C8589 Other specified types of non-Hodgkin lymphoma, extranodal and solid organ sites: Secondary | ICD-10-CM | POA: Diagnosis not present

## 2013-05-27 DIAGNOSIS — R6889 Other general symptoms and signs: Secondary | ICD-10-CM

## 2013-05-27 LAB — CBC & DIFF AND RETIC
BASO%: 0.9 % (ref 0.0–2.0)
EOS%: 2.1 % (ref 0.0–7.0)
HCT: 28.7 % — ABNORMAL LOW (ref 34.8–46.6)
Immature Retic Fract: 4.9 % (ref 1.60–10.00)
MCH: 29.7 pg (ref 25.1–34.0)
MCHC: 30.7 g/dL — ABNORMAL LOW (ref 31.5–36.0)
MCV: 97 fL (ref 79.5–101.0)
MONO#: 0.3 10*3/uL (ref 0.1–0.9)
MONO%: 7.8 % (ref 0.0–14.0)
NEUT%: 72.1 % (ref 38.4–76.8)
RBC: 2.96 10*6/uL — ABNORMAL LOW (ref 3.70–5.45)
RDW: 19.3 % — ABNORMAL HIGH (ref 11.2–14.5)
Retic %: 7.47 % — ABNORMAL HIGH (ref 0.70–2.10)
Retic Ct Abs: 221.11 10*3/uL — ABNORMAL HIGH (ref 33.70–90.70)
lymph#: 0.6 10*3/uL — ABNORMAL LOW (ref 0.9–3.3)

## 2013-05-27 LAB — COMPREHENSIVE METABOLIC PANEL (CC13)
ALT: 14 U/L (ref 0–55)
AST: 18 U/L (ref 5–34)
Albumin: 2.5 g/dL — ABNORMAL LOW (ref 3.5–5.0)
Calcium: 8.6 mg/dL (ref 8.4–10.4)
Chloride: 111 mEq/L — ABNORMAL HIGH (ref 98–109)
Potassium: 4.1 mEq/L (ref 3.5–5.1)
Total Protein: 4.7 g/dL — ABNORMAL LOW (ref 6.4–8.3)

## 2013-05-27 NOTE — Telephone Encounter (Signed)
appts made and printed. Pt is aware that tx's will be added. i emailed MW to add the tx's...td 

## 2013-05-27 NOTE — Patient Instructions (Signed)
Neutropenia Neutropenia is a condition that occurs when the level of a certain type of white blood cell (neutrophil) in your body becomes lower than normal. Neutrophils are made in the bone marrow and fight infections. These cells protect against bacteria and viruses. The fewer neutrophils you have, and the longer your body remains without them, the greater your risk of getting a severe infection becomes. CAUSES  The cause of neutropenia may be hard to determine. However, it is usually due to 3 main problems:   Decreased production of neutrophils. This may be due to:  Certain medicines such as chemotherapy.  Genetic problems.  Cancer.  Radiation treatments.  Vitamin deficiency.  Some pesticides.  Increased destruction of neutrophils. This may be due to:  Overwhelming infections.  Hemolytic anemia. This is when the body destroys its own blood cells.  Chemotherapy.  Neutrophils moving to areas of the body where they cannot fight infections. This may be due to:  Dialysis procedures.  Conditions where the spleen becomes enlarged. Neutrophils are held in the spleen and are not available to the rest of the body.  Overwhelming infections. The neutrophils are held in the area of the infection and are not available to the rest of the body. SYMPTOMS  There are no specific symptoms of neutropenia. The lack of neutrophils can result in an infection, and an infection can cause various problems. DIAGNOSIS  Diagnosis is made by a blood test. A complete blood count is performed. The normal level of neutrophils in human blood differs with age and race. Infants have lower counts than older children and adults. African Americans have lower counts than Caucasians or Asians. The average adult level is 1500 cells/mm3 of blood. Neutrophil counts are interpreted as follows:  Greater than 1000 cells/mm3 gives normal protection against infection.  500 to 1000 cells/mm3 gives an increased risk for  infection.  200 to 500 cells/mm3 is a greater risk for severe infection.  Lower than 200 cells/mm3 is a marked risk of infection. This may require hospitalization and treatment with antibiotic medicines. TREATMENT  Treatment depends on the underlying cause, severity, and presence of infections or symptoms. It also depends on your health. Your caregiver will discuss the treatment plan with you. Mild cases are often easily treated and have a good outcome. Preventative measures may also be started to limit your risk of infections. Treatment can include:  Taking antibiotics.  Stopping medicines that are known to cause neutropenia.  Correcting nutritional deficiencies by eating green vegetables to supply folic acid and taking vitamin B supplements.  Stopping exposure to pesticides if your neutropenia is related to pesticide exposure.  Taking a blood growth factor called sargramostim, pegfilgrastim, or filgrastim if you are undergoing chemotherapy for cancer. This stimulates white blood cell production.  Removal of the spleen if you have Felty's syndrome and have repeated infections. HOME CARE INSTRUCTIONS   Follow your caregiver's instructions about when you need to have blood work done.  Wash your hands often. Make sure others who come in contact with you also wash their hands.  Wash raw fruits and vegetables before eating them. They can carry bacteria and fungi.  Avoid people with colds or spreadable (contagious) diseases (chickenpox, herpes zoster, influenza).  Avoid large crowds.  Avoid construction areas. The dust can release fungus into the air.  Be cautious around children in daycare or school environments.  Take care of your respiratory system by coughing and deep breathing.  Bathe daily.  Protect your skin from cuts and   burns.  Do not work in the garden or with flowers and plants.  Care for the mouth before and after meals by brushing with a soft toothbrush. If you have  mucositis, do not use mouthwash. Mouthwash contains alcohol and can dry out the mouth even more.  Clean the area between the genitals and the anus (perineal area) after urination and bowel movements. Women need to wipe from front to back.  Use a water soluble lubricant during sexual intercourse and practice good hygiene after. Do not have intercourse if you are severely neutropenic. Check with your caregiver for guidelines.  Exercise daily as tolerated.  Avoid people who were vaccinated with a live vaccine in the past 30 days. You should not receive live vaccines (polio, typhoid).  Do not provide direct care for pets. Avoid animal droppings. Do not clean litter boxes and bird cages.  Do not share food utensils.  Do not use tampons, enemas, or rectal suppositories unless directed by your caregiver.  Use an electric razor to remove hair.  Wash your hands after handling magazines, letters, and newspapers. SEEK IMMEDIATE MEDICAL CARE IF:   You have a fever.  You have chills or start to shake.  You feel nauseous or vomit.  You develop mouth sores.  You develop aches and pains.  You have redness and swelling around open wounds.  Your skin is warm to the touch.  You have pus coming from your wounds.  You develop swollen lymph nodes.  You feel weak or fatigued.  You develop red streaks on the skin. MAKE SURE YOU:  Understand these instructions.  Will watch your condition.  Will get help right away if you are not doing well or get worse. Document Released: 01/05/2002 Document Revised: 10/08/2011 Document Reviewed: 02/02/2011 Exit[employer] Patient Information 2014 [employer].  

## 2013-05-27 NOTE — Telephone Encounter (Signed)
Per staff message and POF I have scheduled appts.  JMW  

## 2013-05-28 ENCOUNTER — Encounter: Payer: Self-pay | Admitting: Family Medicine

## 2013-05-28 ENCOUNTER — Ambulatory Visit (INDEPENDENT_AMBULATORY_CARE_PROVIDER_SITE_OTHER): Payer: Medicare Other | Admitting: Family Medicine

## 2013-05-28 ENCOUNTER — Telehealth: Payer: Self-pay | Admitting: Internal Medicine

## 2013-05-28 ENCOUNTER — Inpatient Hospital Stay: Payer: Self-pay | Admitting: Family Medicine

## 2013-05-28 VITALS — BP 110/60 | HR 100 | Wt 154.0 lb

## 2013-05-28 DIAGNOSIS — E119 Type 2 diabetes mellitus without complications: Secondary | ICD-10-CM | POA: Diagnosis not present

## 2013-05-28 DIAGNOSIS — I5033 Acute on chronic diastolic (congestive) heart failure: Secondary | ICD-10-CM | POA: Diagnosis not present

## 2013-05-28 DIAGNOSIS — M7989 Other specified soft tissue disorders: Secondary | ICD-10-CM

## 2013-05-28 DIAGNOSIS — D61810 Antineoplastic chemotherapy induced pancytopenia: Secondary | ICD-10-CM

## 2013-05-28 DIAGNOSIS — C8590 Non-Hodgkin lymphoma, unspecified, unspecified site: Secondary | ICD-10-CM

## 2013-05-28 DIAGNOSIS — C8589 Other specified types of non-Hodgkin lymphoma, extranodal and solid organ sites: Secondary | ICD-10-CM | POA: Diagnosis not present

## 2013-05-28 DIAGNOSIS — T451X5A Adverse effect of antineoplastic and immunosuppressive drugs, initial encounter: Secondary | ICD-10-CM

## 2013-05-28 DIAGNOSIS — I519 Heart disease, unspecified: Secondary | ICD-10-CM

## 2013-05-28 DIAGNOSIS — I5189 Other ill-defined heart diseases: Secondary | ICD-10-CM

## 2013-05-28 LAB — POCT URINALYSIS DIPSTICK
Bilirubin, UA: NEGATIVE
Blood, UA: NEGATIVE
Glucose, UA: NEGATIVE
Ketones, UA: NEGATIVE
Spec Grav, UA: 1.01
Urobilinogen, UA: NEGATIVE
pH, UA: 6

## 2013-05-28 MED ORDER — FUROSEMIDE 20 MG PO TABS: 20.0000 mg | ORAL_TABLET | Freq: Every day | ORAL | Status: DC

## 2013-05-28 MED ORDER — HYDROCOD POLST-CHLORPHEN POLST 10-8 MG/5ML PO LQCR: 5.0000 mL | Freq: Two times a day (BID) | ORAL | Status: DC | PRN

## 2013-05-28 NOTE — Patient Instructions (Signed)
The blood sugars start to go to 50 or above let he know. Try the Combivent for the cough and let me know if it helps.

## 2013-05-28 NOTE — Progress Notes (Signed)
  Subjective:    Patient ID: Tonya Wise, female    DOB: [birth year], 72 y.o.   MRN: [id number]  HPI She is here for an interval evaluation. She has had a very rough time things for the last several months. She was recently discharged from [facility]. The discharge summary was reviewed. Her main complaint today is increased difficulty with coughing she has had for over a year or if she continues to have a hoarse voice. She also is noted bilateral leg edema. She is getting ready to have another round of chemotherapy next week. She is concerned that the cough might be related to blood pressure medication. She continues to use oxygen on an as-needed basis. Her blood sugars on no insulin run between 180 and 209. She does use Combivent on an as-needed basis for her breathing. She has had difficulty controlling her coughing.   Review of Systems     Objective:   Physical Exam alert and in no distress. Tympanic membranes and canals are normal. Throat is clear. Tonsils are normal. Neck is supple without adenopathy or thyromegaly. Cardiac exam shows a regular sinus rhythm without murmurs or gallops. Lungs are clear to auscultation. Lower extremities do show 2-3+ pitting edema. Recent blood work was reviewed.       Assessment & Plan:  Swelling of limb - Plan: Urinalysis Dipstick  Acute on chronic diastolic heart failure  Non-Hodgkins lymphoma  Diabetes mellitus  Antineoplastic chemotherapy induced pancytopenia  Diastolic dysfunction, grade I  discussed the coughing with her and explained that it is very difficult to control. She would like to be switched to a different blood pressure medication and realizes this might not be the issue. I will have her stop the amlodipine since her blood pressure seems to be doing well and follow this. Explained that I did not think that any the medicine she is on would cause difficulty with coughing.. Lasix also given to help with her edema. Recommend she try  Combivent to see if that will help with her coughing. Also prescription for Tussionex given. Also discussed use of honey. I will continue to work with her in regard to her blood pressure and diabetes. Instructed him to call me if her blood sugar goes above 250.

## 2013-05-28 NOTE — Progress Notes (Signed)
[facility] OFFICE PROGRESS NOTE  [name], MD [address] [place] [address]  DIAGNOSIS: Non-Hodgkins lymphoma - Plan: CBC with Differential, Comprehensive metabolic panel, Lactate dehydrogenase, CBC with Differential, CBC with Differential, CBC with Differential, Comprehensive metabolic panel, CT Chest W Contrast, CT Abdomen Pelvis W Wo Contrast  Rigors  Thrush  Protein-calorie malnutrition, severe  Antineoplastic chemotherapy induced pancytopenia  Chief Complaint  Patient presents with  . Lymphoma    CURRENT THERAPY: R miniCHOP x  1, given on 09/20; R-Mini CHO cycle #2 planned for 06/01/13 followed by neulasta 6 mg SQ on Day #2.  Planning to complete 6 to 8 cycles.   INTERVAL HISTORY: Tonya Wise 72 y.o. 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 She had a prior hospitalization from September 12 from September of 26 2014 secondary to hypercalcemia, renal failure and dehydration. During this period, the patient's clinical course was complicated by pneumonia and she was treated with intravenous antibiotics and the development of lethargy and confusion which resolved over the clinical course. She was readmitted from home on 04/25/2013 for worsening mouth sores/oral thrush, odynophagia, failure to thrive. Patient was started on high-dose acyclovir for empiric herpetic encephalitis and/or HSV esophagitis.  She was discharged to [facility] on 10/01 and continue physical rehabilitation and medical provided by Dr. Angelina Ok.  She was discharged from the [facility] on 10/21 and now is at home.  She reports increased appetite and ability to tolerate po.  She is much more communicative and today is accompanied by her daughter and husband [name].   She is concerned about the puffy feet.  She has been on lasix 20 mg daily with moderate improvement. She denies any further  fevers or night sweats.  She denies any recent blood transfusions.   On initial evaluation, she was sent by her PCP to [facility] Emergency department because of anemia (Hgb 6.4) on August 29th, 2014. She reported that she started to lost her appetite and started to lose weight about 2 months ago. She became increasingly weak and fatigued. In addition, she also developed cough with green sputum and dyspnea. Patient also noticed new headaches, diminished hearing. She also complained fevers (persistent) and nightsweats that at times drenched her bedsheets. She reported nasal congestion and white nasal discharge, feeling some lump in throat. On admission, she also complained of abdominal pain, episodes of nausea, low back pain. Patient reported history of colonoscopy and EGD several years ago. She received 3 units of RBC. GI consult was done. Patient was admitted and transfused a total of 3 units of PRBC. GI was consulted and an EGD done on 8/31 did not show any foci for bleeding. CT scan of the abdomen and chest, showed significant lymphadenopathy, suspicious of lymphoma. Hematology/oncology was also consulted and recommended core needle biopsy. Surgery performed a biopsy of R inguinal lymph node revealed Diffuse large B cell lymphoma.   MEDICAL HISTORY: Past Medical History  Diagnosis Date  . Hypertension   . Renal insufficiency   . Osteopenia   . Allergy   . Heart murmur   . GERD (gastroesophageal reflux disease)   . Anemia   . Hypercalcemia 03/27/2013  . Non-Hodgkins lymphoma 04/06/2013  . 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    ALLERGIES:  is allergic to penicillins.  MEDICATIONS: has a current medication list which includes the following prescription(s): albuterol, allopurinol, aspirin, bisoprolol, dextromethorphan, feeding supplement (ensure complete), ondansetron, chlorpheniramine-hydrocodone, furosemide, and valacyclovir.  SURGICAL HISTORY:  Past Surgical History  Procedure Laterality Date  . Appendectomy    . Colonoscopy  2008  . Spine surgery    . Back surgery      LOWER BACK TWICE  . Abdominal hysterectomy    . Esophagogastroduodenoscopy N/A 03/29/2013    Procedure: ESOPHAGOGASTRODUODENOSCOPY (EGD);  Surgeon: Charna Elizabeth, MD;  Location: [facility] ENDOSCOPY;  Service: Endoscopy;  Laterality: N/A;  . Inguinal lymph node biopsy Right 03/31/2013    Procedure: INGUINAL LYMPH NODE BIOPSY;  Surgeon: Cherylynn Ridges, MD;  Location: [facility] OR;  Service: General;  Laterality: Right;    REVIEW OF SYSTEMS:   Constitutional: Denies fevers, chills or abnormal weight loss Eyes: Denies blurriness of vision Ears, nose, mouth, throat, and face: Denies mucositis or sore throat Respiratory: Denies cough, dyspnea or wheezes Cardiovascular: Denies palpitation, chest discomfort; positive for lower extremity swelling Gastrointestinal:  Denies nausea, heartburn or change in bowel habits Skin: Denies abnormal skin rashes Lymphatics: Denies new lymphadenopathy or easy bruising Neurological:Denies numbness, tingling or new weaknesses Behavioral/Psych: Mood is stable, no new changes  All other systems were reviewed with the patient and are negative.  PHYSICAL EXAMINATION: ECOG PERFORMANCE STATUS: 1 - Symptomatic but completely ambulatory  Blood pressure 119/76, pulse  93, temperature 98.4 F (36.9 C), temperature source Oral, resp. rate 18, height 5\' 1"  (1.549 m), weight 0 lb (0 kg), SpO2 98.00%.  GENERAL:alert, no distress and comfortable; Sitting in the chair.  SKIN: skin color, texture, turgor are normal, no rashes or significant lesions; + R sided Port a cath EYES: normal, Conjunctiva are pink and non-injected, sclera clear  OROPHARYNX:no exudate, no erythema and lips, buccal mucosa, and tongue with a grayish hue but without bleeding and minimal swelling.  No lip lesions identified. OP clear.  NECK: supple, thyroid normal size, non-tender, without nodularity LYMPH:  no palpable lymphadenopathy in the cervical, axillary or supraclavicular LUNGS: clear to auscultation and percussion with normal breathing effort HEART: regular rate & rhythm and no murmurs and + 2-3 pitting lower extremity edema ABDOMEN:abdomen soft, non-tender and normal bowel sounds Musculoskeletal:no cyanosis of digits and no clubbing  NEURO: alert & oriented x 3 with fluent speech, no focal motor/sensory deficits  LABORATORY DATA: Results for orders placed in visit on 05/27/13 (from the past 48 hour(s))  CBC & DIFF AND RETIC     Status: Abnormal   Collection Time    05/27/13  9:40 AM      Result Value Range   WBC 3.3 (*) 3.9 - 10.3 10e3/uL   NEUT# 2.4  1.5 - 6.5 10e3/uL   HGB 8.8 (*) 11.6 - 15.9 g/dL   HCT 16.1 (*) 09.6 - 04.5 %   Platelets 122 (*) 145 - 400 10e3/uL   MCV 97.0  79.5 - 101.0 fL   MCH 29.7  25.1 - 34.0 pg   MCHC 30.7 (*) 31.5 - 36.0 g/dL   RBC 4.09 (*) 8.11 - 9.14 10e6/uL   RDW 19.3 (*) 11.2 - 14.5 %   lymph#  0.6 (*) 0.9 - 3.3 10e3/uL   MONO# 0.3  0.1 - 0.9 10e3/uL   Eosinophils Absolute 0.1  0.0 - 0.5 10e3/uL   Basophils Absolute 0.0  0.0 - 0.1 10e3/uL   NEUT% 72.1  38.4 - 76.8 %   LYMPH% 17.1  14.0 - 49.7 %   MONO% 7.8  0.0 - 14.0 %   EOS% 2.1  0.0 - 7.0 %   BASO% 0.9  0.0 - 2.0 %   Retic % 7.47 (*) 0.70 - 2.10 %   Retic Ct Abs 221.11 (*) 33.70 - 90.70  10e3/uL   Immature Retic Fract 4.90  1.60 - 10.00 %  COMPREHENSIVE METABOLIC PANEL (CC13)     Status: Abnormal   Collection Time    05/27/13  9:40 AM      Result Value Range   Sodium 142  136 - 145 mEq/L   Potassium 4.1  3.5 - 5.1 mEq/L   Chloride 111 (*) 98 - 109 mEq/L   CO2 23  22 - 29 mEq/L   Glucose 160 (*) 70 - 140 mg/dl   BUN 40.9  7.0 - 81.1 mg/dL   Creatinine 1.0  0.6 - 1.1 mg/dL   Total Bilirubin 9.14  0.20 - 1.20 mg/dL   Alkaline Phosphatase 110  40 - 150 U/L   AST 18  5 - 34 U/L   ALT 14  0 - 55 U/L   Total Protein 4.7 (*) 6.4 - 8.3 g/dL   Albumin 2.5 (*) 3.5 - 5.0 g/dL   Calcium 8.6  8.4 - 78.2 mg/dL   Anion Gap 8  3 - 11 mEq/L  [facility] SMEAR     Status: None   Collection Time    05/27/13  9:40 AM      Result Value Range   Smear Result Smear Available      Labs:  Lab Results  Component Value Date   WBC 3.3* 05/27/2013   HGB 8.8* 05/27/2013   HCT 28.7* 05/27/2013   MCV 97.0 05/27/2013   PLT 122* 05/27/2013   NEUTROABS 2.4 05/27/2013      Chemistry      Component Value Date/Time   NA 142 05/27/2013 0940   NA 137 04/29/2013 0500   K 4.1 05/27/2013 0940   K 3.5 04/29/2013 0500   CL 103 04/29/2013 0500   CO2 23 05/27/2013 0940   CO2 25 04/29/2013 0500   BUN 12.7 05/27/2013 0940   BUN 16 04/29/2013 0500   CREATININE 1.0 05/27/2013 0940   CREATININE 0.96 04/29/2013 0500   CREATININE 1.37* 03/26/2013 1338      Component Value Date/Time   CALCIUM 8.6 05/27/2013 0940   CALCIUM 7.0* 04/29/2013 0500   ALKPHOS 110 05/27/2013 0940   ALKPHOS 154* 04/25/2013 1410   AST 18 05/27/2013 0940   AST 35 04/25/2013 1410   ALT 14 05/27/2013 0940   ALT 16 04/25/2013 1410   BILITOT 0.53 05/27/2013 0940   BILITOT 1.0 04/25/2013 1410     Basic Metabolic Panel:  Recent Labs Lab 05/27/13 0940  NA 142  K 4.1  CO2 23  GLUCOSE 160*  BUN 12.7  CREATININE 1.0  CALCIUM 8.6   GFR The CrCl is unknown because both a height and weight (above a minimum accepted value) are required  for this calculation. Liver Function Tests:  Recent Labs Lab 05/27/13 0940  AST 18  ALT 14  ALKPHOS 110  BILITOT 0.53  PROT 4.7*  ALBUMIN 2.5*  CBC:  Recent Labs Lab 05/27/13 0940  WBC 3.3*  NEUTROABS 2.4  HGB 8.8*  HCT 28.7*  MCV 97.0  PLT 122*   Results for LAURENASHLEY, VIAR (MRN [id number]) as of 04/17/2013 16:25   Ref. Range  04/16/2013 18:12   Hepatitis B Surface Ag  Latest Range: NEGATIVE  NEGATIVE   Hep B S Ab  Latest Range: NEGATIVE  NEGATIVE   Hep B Core Total Ab  Latest Range: NEGATIVE  NEGATIVE     Studies:  Bone Marrow c/w with DLBCL per preliminary report. Await final report.  Pathology (03/31/2013):  Lymph node for lymphoma, Right, Inguinal  - DIFFUSE LARGE B CELL LYMPHOMA  - SEE ONCOLOGY TABLE.  Microscopic Comment  LYMPHOMA  Histologic type: Non-Hodgkin's lymphoma, diffuse large cell type. Grade (if applicable): High grade.  Flow cytometry: A minor monoclonal B cell population with lambda light chain restriction. ([id number]).  Immunohistochemical stains: BCL-2, BCL-6, CD10, CD138, CD20, CD3, CD30, LCA, CD43, and CD79a with appropriate controls.  Touch preps/imprints: Abundance of large lymphoid cells with prominent nucleoli admixed with small lymphoid cells.  Comments: The sections show effacement of the lymph nodal architecture by a diffuse relatively monomorphicpopulation of large lymphoid cells with vesicular chromatin, prominent nucleoli and eosinophilic to amphophilic cytoplasm. This is associated with brisk mitoses and areas of tumor necrosis. The appearance is mostly diffuse with lack of follicular of nodular structures. Flow cytometric analysis was performed ([id number]) and shows a minor population of monoclonal B cells displaying pan B cell antigens including CD20.  Immunohistochemical stains were performed and show that the large atypical lymphoid cells are positive for LCA, CD79a, CD20, BCL-2 and BCL-6. There is partial positivity for CD30. No  appreciable positivity is seen with CD10 or CD138. CD3 and CD43 highlight the admixed T cell component present in the background. The overall histologic and immunophenotypic features are consistent with diffuse large B cell lymphoma.  Specimen Gross and Clinical Information  Specimen(s) Obtained:  Lymph node for lymphoma, Right, Inguinal  1 of 2  FINAL for VENUS, GILLES ([id number])  Specimen Clinical Information  Right Inguinal Adenopathy (jmc)  Gross  Received fresh is a 3.4 x 2.8 x 2.5 cm encapsulated, rubbery, ovoid nodule. There is a separate, focally  disrupted 2.5 x 2.2 x 1.2 cm nodular portion of tissue. The cut surfaces are solid, tan-white with focal slight  hemorrhage. A portion of the specimen is placed in RPMI for flow cytometry and touch preparations are made  from the cut surfaces. Sections are submitted in three cassettes. (GRP:ecj 03/31/2013)  Stain(s) used in Diagnosis:  The following stain(s) were used in diagnosing the case: BCl 6 , CD 79a, CD 30, BCl 2, CD 138, CD 3, CD 43,  CD45 (LCA), CD-10, CD 20. The control(s) stained appropriately.   RADIOGRAPHIC STUDIES: No results found.  ASSESSMENT: Tonya Wise 72 y.o. female with a history of Non-Hodgkins lymphoma - Plan: CBC with Differential, Comprehensive metabolic panel, Lactate dehydrogenase, CBC with Differential, CBC with Differential, CBC with Differential, Comprehensive metabolic panel, CT Chest W Contrast, CT Abdomen Pelvis W Wo Contrast  Rigors  Thrush  Protein-calorie malnutrition, severe  Antineoplastic chemotherapy induced pancytopenia   PLAN:  1. DLBCL, high grade. stage IV with bone marrow involvement and B symptoms (fevers and nightsweats).  --Her International Prognostic index is 4 (NEJM, 1993) with a 3-year survival rate of 59% based on an elevated LDH, age greater than 78 years old, performance status of 2, Stage IV. She has  extranodal involvement in the spleen and in the bone marrow.  --Today,  we had a detailed discussion the patient and her family starting R-miniCHOP (Peyrade F, [employer] Oncology) chemotherapy including the benefits of possibly controlling her disease and the side-effects of therapy including risks of bone marrow toxicity including but not limited to myelosuppression which can lead to the development of life-threatening infections, nausea/vomiting, diarrhea, ananaphylaxis, neuropathy, cystitis, flu-like symptoms. She understood these risk and agreed to proceed with chemotherapy. We will start R-mini CHO (excluding the prednisone at her request due to altered mental status changes while on prednisone and worsening hypergycemia) q 21 days for 6-8 cycles consisting of rituximab, cyclophosphamide, doxorubicin, vincristine. In discussion with pharmacy, we choose R-miniCHO based on her poor nutritional reserve and prolonged recovery during her 1st cycle. We discussed the risks associated with smaller dose includes less effective treatment potentially but may allow her more recovery.  We will reassess her physical state and discuss increasing her R-CHO based on continued recovery.  Her treatment regiment is as follows:       R-miniCHO (Cycle #2, Day 1 on 06/01/2013)          Day #1      Doxorubicin  25 mg/m(2)       Vincristine 1 mg       Cyclophosphamide 400 mg/m(2)       Rituximab 375 mg/m(2)      (Prednisone WILL BE EXCLUDED due to above).           Day #2     She will be given neulasta 6 mg SQ x 1 on day #2 (06/02/2013).   She will continue prophylaxis with allopurinol  300 mg daily. She will start anti-emetics with zofran 8 mg two times daily starting the day after chemo for 3 days then two times a day prn for nausea or vomiting.   -- We will plan for repeat imaging following her 2nd cycle of RminiCHO on or around 06/18/2013.   She did not have a baseline PET due to her prior admission.  We will also order a PET with diagnostic CT of Chest/Abdomen and pelvis prior to cycle  #3.     2. 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 -- Her counts are continuing to recover.  She is no longer neutropenic with an ANC of 2.4 and her plts are 122,000.  We will transfuse pRBCs based on symptoms and to maintain a hemoglobin greater than 8.   She denies any recent infections.  She has a cough which is chronic with a reportedly negative chest x-ray.  She is on delsym 30 mg/5 ml 2 times daily.   3. Diabetes mellitus complicated by prior steroid use. --She continues to take anti-hyperglyemics.   4. Poor nutritional.  --Continue to supplement diet with Ensure complete 3 times daily with meals.   5. History of thrush/ questionable HSV esophagitis --She is off diflucan presently.  We will monitor closely.  She remains on valtrex 500 mg tid.    6. Lower extremity edema.  --Likely secondary to #4 and prior fluid overload doing her recent hospitalization.  We will monitor her weight.  Her creatinine is within normal limits.   7. Follow-up.   --Patient will follow-up for an office visit with CBC, CMP, LDH in consideration for Cycle #3 of R-miniCHO on 06/22/2013.    All questions were answered. The patient knows to call the clinic with any problems, questions or concerns. We can certainly see the patient much sooner  if necessary.   I spent 25 minutes counseling the patient face to face. The total time spent in the appointment was 40 minutes.    Brittian Renaldo, MD 05/28/2013 6:42 PM

## 2013-05-29 ENCOUNTER — Telehealth: Payer: Self-pay | Admitting: Dietician

## 2013-05-29 DIAGNOSIS — Z48817 Encounter for surgical aftercare following surgery on the skin and subcutaneous tissue: Secondary | ICD-10-CM | POA: Diagnosis not present

## 2013-05-29 DIAGNOSIS — R599 Enlarged lymph nodes, unspecified: Secondary | ICD-10-CM | POA: Diagnosis not present

## 2013-05-29 DIAGNOSIS — D649 Anemia, unspecified: Secondary | ICD-10-CM | POA: Diagnosis not present

## 2013-05-29 DIAGNOSIS — I129 Hypertensive chronic kidney disease with stage 1 through stage 4 chronic kidney disease, or unspecified chronic kidney disease: Secondary | ICD-10-CM | POA: Diagnosis not present

## 2013-06-01 ENCOUNTER — Other Ambulatory Visit: Payer: Medicare Other | Admitting: Lab

## 2013-06-01 ENCOUNTER — Ambulatory Visit (HOSPITAL_BASED_OUTPATIENT_CLINIC_OR_DEPARTMENT_OTHER): Payer: Medicare Other

## 2013-06-01 ENCOUNTER — Other Ambulatory Visit (HOSPITAL_BASED_OUTPATIENT_CLINIC_OR_DEPARTMENT_OTHER): Payer: Medicare Other | Admitting: Lab

## 2013-06-01 ENCOUNTER — Other Ambulatory Visit: Payer: Self-pay | Admitting: Internal Medicine

## 2013-06-01 VITALS — BP 139/80 | HR 113 | Temp 98.4°F | Resp 20

## 2013-06-01 DIAGNOSIS — C8589 Other specified types of non-Hodgkin lymphoma, extranodal and solid organ sites: Secondary | ICD-10-CM

## 2013-06-01 DIAGNOSIS — I5030 Unspecified diastolic (congestive) heart failure: Secondary | ICD-10-CM | POA: Diagnosis not present

## 2013-06-01 DIAGNOSIS — Z5111 Encounter for antineoplastic chemotherapy: Secondary | ICD-10-CM

## 2013-06-01 DIAGNOSIS — D649 Anemia, unspecified: Secondary | ICD-10-CM | POA: Diagnosis not present

## 2013-06-01 DIAGNOSIS — IMO0001 Reserved for inherently not codable concepts without codable children: Secondary | ICD-10-CM | POA: Diagnosis not present

## 2013-06-01 DIAGNOSIS — Z5112 Encounter for antineoplastic immunotherapy: Secondary | ICD-10-CM

## 2013-06-01 DIAGNOSIS — C8590 Non-Hodgkin lymphoma, unspecified, unspecified site: Secondary | ICD-10-CM

## 2013-06-01 DIAGNOSIS — I129 Hypertensive chronic kidney disease with stage 1 through stage 4 chronic kidney disease, or unspecified chronic kidney disease: Secondary | ICD-10-CM | POA: Diagnosis not present

## 2013-06-01 LAB — CBC WITH DIFFERENTIAL/PLATELET
Basophils Absolute: 0 10*3/uL (ref 0.0–0.1)
EOS%: 2.8 % (ref 0.0–7.0)
Eosinophils Absolute: 0.1 10*3/uL (ref 0.0–0.5)
HCT: 29.7 % — ABNORMAL LOW (ref 34.8–46.6)
HGB: 9.3 g/dL — ABNORMAL LOW (ref 11.6–15.9)
MCH: 30 pg (ref 25.1–34.0)
MCV: 95.8 fL (ref 79.5–101.0)
MONO#: 0.3 10*3/uL (ref 0.1–0.9)
MONO%: 9.8 % (ref 0.0–14.0)
NEUT#: 2 10*3/uL (ref 1.5–6.5)
NEUT%: 63.1 % (ref 38.4–76.8)
Platelets: 105 10*3/uL — ABNORMAL LOW (ref 145–400)
RDW: 18.9 % — ABNORMAL HIGH (ref 11.2–14.5)
WBC: 3.2 10*3/uL — ABNORMAL LOW (ref 3.9–10.3)

## 2013-06-01 LAB — COMPREHENSIVE METABOLIC PANEL (CC13)
Albumin: 2.5 g/dL — ABNORMAL LOW (ref 3.5–5.0)
Alkaline Phosphatase: 88 U/L (ref 40–150)
Anion Gap: 10 mEq/L (ref 3–11)
BUN: 8.7 mg/dL (ref 7.0–26.0)
CO2: 23 mEq/L (ref 22–29)
Chloride: 109 mEq/L (ref 98–109)
Glucose: 165 mg/dl — ABNORMAL HIGH (ref 70–140)
Sodium: 143 mEq/L (ref 136–145)
Total Bilirubin: 0.6 mg/dL (ref 0.20–1.20)
Total Protein: 4.4 g/dL — ABNORMAL LOW (ref 6.4–8.3)

## 2013-06-01 LAB — LACTATE DEHYDROGENASE (CC13): LDH: 278 U/L — ABNORMAL HIGH (ref 125–245)

## 2013-06-01 MED ORDER — DIPHENHYDRAMINE HCL 25 MG PO CAPS
ORAL_CAPSULE | ORAL | Status: AC
  Filled 2013-06-01: qty 2

## 2013-06-01 MED ORDER — DIPHENHYDRAMINE HCL 25 MG PO CAPS
50.0000 mg | ORAL_CAPSULE | Freq: Once | ORAL | Status: AC
  Administered 2013-06-01: 50 mg via ORAL

## 2013-06-01 MED ORDER — DOXORUBICIN HCL CHEMO IV INJECTION 2 MG/ML
25.0000 mg/m2 | Freq: Once | INTRAVENOUS | Status: AC
  Administered 2013-06-01: 44 mg via INTRAVENOUS
  Filled 2013-06-01: qty 22

## 2013-06-01 MED ORDER — DEXAMETHASONE SODIUM PHOSPHATE 20 MG/5ML IJ SOLN
INTRAMUSCULAR | Status: AC
  Filled 2013-06-01: qty 5

## 2013-06-01 MED ORDER — HEPARIN SOD (PORK) LOCK FLUSH 100 UNIT/ML IV SOLN
500.0000 [IU] | Freq: Once | INTRAVENOUS | Status: AC | PRN
  Administered 2013-06-01: 500 [IU]
  Filled 2013-06-01: qty 5

## 2013-06-01 MED ORDER — DEXAMETHASONE SODIUM PHOSPHATE 20 MG/5ML IJ SOLN
20.0000 mg | Freq: Once | INTRAMUSCULAR | Status: AC
  Administered 2013-06-01: 20 mg via INTRAVENOUS

## 2013-06-01 MED ORDER — CYCLOPHOSPHAMIDE CHEMO INJECTION 1 GM
400.0000 mg/m2 | Freq: Once | INTRAMUSCULAR | Status: AC
  Administered 2013-06-01: 700 mg via INTRAVENOUS
  Filled 2013-06-01: qty 35

## 2013-06-01 MED ORDER — VINCRISTINE SULFATE CHEMO INJECTION 1 MG/ML
1.0000 mg | Freq: Once | INTRAVENOUS | Status: AC
  Administered 2013-06-01: 1 mg via INTRAVENOUS
  Filled 2013-06-01: qty 1

## 2013-06-01 MED ORDER — SODIUM CHLORIDE 0.9 % IV SOLN
Freq: Once | INTRAVENOUS | Status: AC
  Administered 2013-06-01: 09:00:00 via INTRAVENOUS

## 2013-06-01 MED ORDER — ONDANSETRON 16 MG/50ML IVPB (CHCC)
16.0000 mg | Freq: Once | INTRAVENOUS | Status: AC
  Administered 2013-06-01: 16 mg via INTRAVENOUS

## 2013-06-01 MED ORDER — ONDANSETRON 16 MG/50ML IVPB (CHCC)
INTRAVENOUS | Status: AC
  Filled 2013-06-01: qty 16

## 2013-06-01 MED ORDER — ACETAMINOPHEN 325 MG PO TABS
ORAL_TABLET | ORAL | Status: AC
  Filled 2013-06-01: qty 2

## 2013-06-01 MED ORDER — ACETAMINOPHEN 325 MG PO TABS
650.0000 mg | ORAL_TABLET | Freq: Once | ORAL | Status: AC
  Administered 2013-06-01: 650 mg via ORAL

## 2013-06-01 MED ORDER — SODIUM CHLORIDE 0.9 % IJ SOLN
10.0000 mL | INTRAMUSCULAR | Status: DC | PRN
  Administered 2013-06-01: 10 mL
  Filled 2013-06-01: qty 10

## 2013-06-01 MED ORDER — SODIUM CHLORIDE 0.9 % IV SOLN
375.0000 mg/m2 | Freq: Once | INTRAVENOUS | Status: AC
  Administered 2013-06-01: 700 mg via INTRAVENOUS
  Filled 2013-06-01: qty 70

## 2013-06-01 NOTE — Progress Notes (Signed)
Chaplain made initial visit. Patient was alert and friendly. Husband [name] was present. Patient said that she'd had chemo once before but that it had been inpatient and that she had been "out of it," so in some ways this felt like her first time having chemo. Patient said everything was fine and that RN Wylene Men had been very reassuring and comforting when she first came in, so she felt calm going into her treatment. Patient and husband asked that chaplain keep them in her prayers.

## 2013-06-01 NOTE — Patient Instructions (Signed)
[facility] Discharge Instructions for Patients Receiving Chemotherapy  Today you received the following chemotherapy agents: Doxorubicin, Vincristine, Cytoxan, Rituxan   To help prevent nausea and vomiting after your treatment, we encourage you to take your nausea medication as prescribed.   If you develop nausea and vomiting that is not controlled by your nausea medication, call the clinic.   BELOW ARE SYMPTOMS THAT SHOULD BE REPORTED IMMEDIATELY:  *FEVER GREATER THAN 100.5 F  *CHILLS WITH OR WITHOUT FEVER  NAUSEA AND VOMITING THAT IS NOT CONTROLLED WITH YOUR NAUSEA MEDICATION  *UNUSUAL SHORTNESS OF BREATH  *UNUSUAL BRUISING OR BLEEDING  TENDERNESS IN MOUTH AND THROAT WITH OR WITHOUT PRESENCE OF ULCERS  *URINARY PROBLEMS  *BOWEL PROBLEMS  UNUSUAL RASH Items with * indicate a potential emergency and should be followed up as soon as possible.  Feel free to call the clinic you have any questions or concerns. The clinic phone number is [phone].

## 2013-06-01 NOTE — Progress Notes (Signed)
Patient arrived to infusion room previously accessed with port-a-cath needle, transparent dressing and biopatch in place. Dressing was dated 05/18/13, and patient states it had not been used in several days. Per discussion with Chrystie Nose, RN 4, this RN removed soiled dressing and deaccessed needle. Port-a-cath then reaccessed per protocol without difficulty, brisk blood return noted. Patient denied complaints.

## 2013-06-02 ENCOUNTER — Ambulatory Visit (HOSPITAL_BASED_OUTPATIENT_CLINIC_OR_DEPARTMENT_OTHER): Payer: Medicare Other

## 2013-06-02 VITALS — BP 125/65 | HR 104 | Temp 98.1°F

## 2013-06-02 DIAGNOSIS — C8589 Other specified types of non-Hodgkin lymphoma, extranodal and solid organ sites: Secondary | ICD-10-CM

## 2013-06-02 DIAGNOSIS — C8590 Non-Hodgkin lymphoma, unspecified, unspecified site: Secondary | ICD-10-CM

## 2013-06-02 MED ORDER — PEGFILGRASTIM INJECTION 6 MG/0.6ML
6.0000 mg | Freq: Once | SUBCUTANEOUS | Status: AC
  Administered 2013-06-02: 6 mg via SUBCUTANEOUS
  Filled 2013-06-02: qty 0.6

## 2013-06-02 NOTE — Patient Instructions (Signed)

## 2013-06-03 ENCOUNTER — Ambulatory Visit: Payer: Medicare Other

## 2013-06-03 ENCOUNTER — Other Ambulatory Visit: Payer: Medicare Other | Admitting: Lab

## 2013-06-08 ENCOUNTER — Telehealth: Payer: Self-pay | Admitting: Internal Medicine

## 2013-06-08 ENCOUNTER — Other Ambulatory Visit (HOSPITAL_BASED_OUTPATIENT_CLINIC_OR_DEPARTMENT_OTHER): Payer: Medicare Other | Admitting: Lab

## 2013-06-08 ENCOUNTER — Other Ambulatory Visit: Payer: Self-pay | Admitting: Internal Medicine

## 2013-06-08 ENCOUNTER — Telehealth: Payer: Self-pay | Admitting: *Deleted

## 2013-06-08 ENCOUNTER — Other Ambulatory Visit: Payer: Self-pay | Admitting: *Deleted

## 2013-06-08 DIAGNOSIS — C8590 Non-Hodgkin lymphoma, unspecified, unspecified site: Secondary | ICD-10-CM

## 2013-06-08 DIAGNOSIS — E876 Hypokalemia: Secondary | ICD-10-CM

## 2013-06-08 DIAGNOSIS — C8589 Other specified types of non-Hodgkin lymphoma, extranodal and solid organ sites: Secondary | ICD-10-CM

## 2013-06-08 LAB — COMPREHENSIVE METABOLIC PANEL (CC13)
ALT: 8 U/L (ref 0–55)
AST: 14 U/L (ref 5–34)
Alkaline Phosphatase: 94 U/L (ref 40–150)
Anion Gap: 11 mEq/L (ref 3–11)
CO2: 25 mEq/L (ref 22–29)
Creatinine: 0.8 mg/dL (ref 0.6–1.1)
Sodium: 140 mEq/L (ref 136–145)
Total Bilirubin: 0.64 mg/dL (ref 0.20–1.20)
Total Protein: 4.9 g/dL — ABNORMAL LOW (ref 6.4–8.3)

## 2013-06-08 LAB — CBC WITH DIFFERENTIAL/PLATELET
BASO%: 0.5 % (ref 0.0–2.0)
Basophils Absolute: 0 10*3/uL (ref 0.0–0.1)
EOS%: 5.2 % (ref 0.0–7.0)
Eosinophils Absolute: 0.2 10*3/uL (ref 0.0–0.5)
LYMPH%: 23.3 % (ref 14.0–49.7)
MCHC: 32 g/dL (ref 31.5–36.0)
MCV: 93.1 fL (ref 79.5–101.0)
MONO%: 12.2 % (ref 0.0–14.0)
NEUT#: 2.3 10*3/uL (ref 1.5–6.5)
Platelets: 36 10*3/uL — ABNORMAL LOW (ref 145–400)
RBC: 2.75 10*6/uL — ABNORMAL LOW (ref 3.70–5.45)
RDW: 17.6 % — ABNORMAL HIGH (ref 11.2–14.5)
nRBC: 0 % (ref 0–0)

## 2013-06-08 MED ORDER — POTASSIUM CHLORIDE ER 10 MEQ PO TBCR: 10.0000 meq | EXTENDED_RELEASE_TABLET | Freq: Two times a day (BID) | ORAL | Status: DC

## 2013-06-08 MED ORDER — NYSTATIN 100000 UNIT/ML MT SUSP: 5.0000 mL | Freq: Four times a day (QID) | OROMUCOSAL | Status: DC | PRN

## 2013-06-08 MED ORDER — FLUCONAZOLE 100 MG PO TABS: 200.0000 mg | ORAL_TABLET | Freq: Once | ORAL | Status: DC

## 2013-06-08 NOTE — Telephone Encounter (Signed)
Called in KDUR 10 mEq bid x 7 days.  Recheck labs in one week.  Left messages at home and voicemail.  Myra Rude, MD.

## 2013-06-08 NOTE — Telephone Encounter (Signed)
Pt came for lab only today.  Triage nurse was informed that pt has thrush infection and would like to talk to nurse.  Spoke with pt and husband in the lobby as a walk-in.  Nurse noted white coating on tongue.  Pt stated she noticed thrush this am.  Pt also c/o slight burning at tip of tongue.  No redness noted on tip of tongue.  Dr. Rosie Fate notified of pt's thrush problem along with pt's CBC results from today.  Spoke with pt and husband in the lobby again.  Informed both that prescriptions will be called in to pt's pharmacy to start today as per Dr. Benjiman Core instructions.  Both voiced understanding.

## 2013-06-10 DIAGNOSIS — I5030 Unspecified diastolic (congestive) heart failure: Secondary | ICD-10-CM | POA: Diagnosis not present

## 2013-06-10 DIAGNOSIS — I129 Hypertensive chronic kidney disease with stage 1 through stage 4 chronic kidney disease, or unspecified chronic kidney disease: Secondary | ICD-10-CM | POA: Diagnosis not present

## 2013-06-10 DIAGNOSIS — C8589 Other specified types of non-Hodgkin lymphoma, extranodal and solid organ sites: Secondary | ICD-10-CM | POA: Diagnosis not present

## 2013-06-10 DIAGNOSIS — IMO0001 Reserved for inherently not codable concepts without codable children: Secondary | ICD-10-CM | POA: Diagnosis not present

## 2013-06-10 DIAGNOSIS — D649 Anemia, unspecified: Secondary | ICD-10-CM | POA: Diagnosis not present

## 2013-06-11 ENCOUNTER — Telehealth: Payer: Self-pay | Admitting: Family Medicine

## 2013-06-11 DIAGNOSIS — D649 Anemia, unspecified: Secondary | ICD-10-CM | POA: Diagnosis not present

## 2013-06-11 DIAGNOSIS — I5030 Unspecified diastolic (congestive) heart failure: Secondary | ICD-10-CM | POA: Diagnosis not present

## 2013-06-11 DIAGNOSIS — I129 Hypertensive chronic kidney disease with stage 1 through stage 4 chronic kidney disease, or unspecified chronic kidney disease: Secondary | ICD-10-CM | POA: Diagnosis not present

## 2013-06-11 DIAGNOSIS — C8589 Other specified types of non-Hodgkin lymphoma, extranodal and solid organ sites: Secondary | ICD-10-CM | POA: Diagnosis not present

## 2013-06-11 DIAGNOSIS — IMO0001 Reserved for inherently not codable concepts without codable children: Secondary | ICD-10-CM | POA: Diagnosis not present

## 2013-06-11 NOTE — Telephone Encounter (Signed)
KiKi From [facility] at [facility] left a message stating that her Heart rate has been up in the 120's and she wanted to know if it was okay to do excerises with her and how much she can push her with PT. Please call KIKI back and advise her [phone]

## 2013-06-12 NOTE — Telephone Encounter (Signed)
[name] RUTHS HUSBAND SAID THAT WHEN SHE CAME OFF OF BETA BLOCKER HER COUGH HAS GONE AWAY BUT RUTHS HEART RATE STAYS IN THE 120'S PLEASE ADVISE ON WHAT YOU WANT DONE

## 2013-06-12 NOTE — Telephone Encounter (Signed)
I'm okay with her continue her PT if insurance will cover it

## 2013-06-12 NOTE — Telephone Encounter (Signed)
Should not have a resting heart rate in the 120 range. Check with Addalee on that is the case, she should be looked at

## 2013-06-12 NOTE — Telephone Encounter (Signed)
PT HAS APPOINTMENT WED. 19 TO COME IN THE MAIN QUESTION IS CAN SHE CONTINUE HER P/T

## 2013-06-12 NOTE — Telephone Encounter (Signed)
RUTHS HUSBAND [name] WAS INFORMED THAT Ashna COULD CONTINUE P/T

## 2013-06-15 ENCOUNTER — Telehealth: Payer: Self-pay | Admitting: Internal Medicine

## 2013-06-15 ENCOUNTER — Other Ambulatory Visit (HOSPITAL_BASED_OUTPATIENT_CLINIC_OR_DEPARTMENT_OTHER): Payer: Medicare Other | Admitting: Lab

## 2013-06-15 DIAGNOSIS — C8589 Other specified types of non-Hodgkin lymphoma, extranodal and solid organ sites: Secondary | ICD-10-CM | POA: Diagnosis not present

## 2013-06-15 DIAGNOSIS — C8590 Non-Hodgkin lymphoma, unspecified, unspecified site: Secondary | ICD-10-CM

## 2013-06-15 DIAGNOSIS — E876 Hypokalemia: Secondary | ICD-10-CM

## 2013-06-15 LAB — BASIC METABOLIC PANEL (CC13)
BUN: 8.7 mg/dL (ref 7.0–26.0)
CO2: 24 mEq/L (ref 22–29)
Calcium: 9 mg/dL (ref 8.4–10.4)
Chloride: 108 mEq/L (ref 98–109)
Creatinine: 1 mg/dL (ref 0.6–1.1)
Glucose: 158 mg/dl — ABNORMAL HIGH (ref 70–140)

## 2013-06-15 LAB — CBC WITH DIFFERENTIAL/PLATELET
BASO%: 1.3 % (ref 0.0–2.0)
Basophils Absolute: 0.1 10*3/uL (ref 0.0–0.1)
HCT: 30.3 % — ABNORMAL LOW (ref 34.8–46.6)
LYMPH%: 14.7 % (ref 14.0–49.7)
MCHC: 32.3 g/dL (ref 31.5–36.0)
MCV: 96.1 fL (ref 79.5–101.0)
MONO#: 0.5 10*3/uL (ref 0.1–0.9)
NEUT%: 74.5 % (ref 38.4–76.8)
Platelets: 134 10*3/uL — ABNORMAL LOW (ref 145–400)
RBC: 3.16 10*6/uL — ABNORMAL LOW (ref 3.70–5.45)
WBC: 6.2 10*3/uL (ref 3.9–10.3)

## 2013-06-15 NOTE — Telephone Encounter (Signed)
Labs reviewed.  Looked good.  Discusses with patient and her husband.

## 2013-06-16 ENCOUNTER — Other Ambulatory Visit: Payer: Self-pay | Admitting: Internal Medicine

## 2013-06-16 ENCOUNTER — Ambulatory Visit (HOSPITAL_COMMUNITY): Payer: Medicare Other

## 2013-06-16 ENCOUNTER — Ambulatory Visit (HOSPITAL_COMMUNITY)
Admission: RE | Admit: 2013-06-16 | Discharge: 2013-06-16 | Disposition: A | Discharge status: Home / Self Care | Payer: Medicare Other | Source: Ambulatory Visit | Attending: Internal Medicine | Admitting: Internal Medicine

## 2013-06-16 ENCOUNTER — Telehealth: Payer: Self-pay | Admitting: Internal Medicine

## 2013-06-16 ENCOUNTER — Encounter (HOSPITAL_COMMUNITY): Payer: Self-pay

## 2013-06-16 ENCOUNTER — Encounter (HOSPITAL_COMMUNITY)
Admission: RE | Admit: 2013-06-16 | Discharge: 2013-06-16 | Disposition: A | Discharge status: Home / Self Care | Payer: Medicare Other | Source: Ambulatory Visit | Attending: Internal Medicine | Admitting: Internal Medicine

## 2013-06-16 DIAGNOSIS — R599 Enlarged lymph nodes, unspecified: Secondary | ICD-10-CM | POA: Diagnosis not present

## 2013-06-16 DIAGNOSIS — J90 Pleural effusion, not elsewhere classified: Secondary | ICD-10-CM | POA: Diagnosis not present

## 2013-06-16 DIAGNOSIS — C8590 Non-Hodgkin lymphoma, unspecified, unspecified site: Secondary | ICD-10-CM

## 2013-06-16 DIAGNOSIS — R161 Splenomegaly, not elsewhere classified: Secondary | ICD-10-CM | POA: Insufficient documentation

## 2013-06-16 DIAGNOSIS — C8589 Other specified types of non-Hodgkin lymphoma, extranodal and solid organ sites: Secondary | ICD-10-CM | POA: Insufficient documentation

## 2013-06-16 DIAGNOSIS — D739 Disease of spleen, unspecified: Secondary | ICD-10-CM | POA: Diagnosis not present

## 2013-06-16 DIAGNOSIS — I2699 Other pulmonary embolism without acute cor pulmonale: Secondary | ICD-10-CM

## 2013-06-16 LAB — GLUCOSE, CAPILLARY: Glucose-Capillary: 105 mg/dL — ABNORMAL HIGH (ref 70–99)

## 2013-06-16 MED ORDER — WARFARIN SODIUM 5 MG PO TABS: 5.0000 mg | ORAL_TABLET | Freq: Every day | ORAL | Status: DC

## 2013-06-16 MED ORDER — ENOXAPARIN SODIUM 40 MG/0.4ML ~~LOC~~ SOLN: 100.0000 mg | SUBCUTANEOUS | Status: DC

## 2013-06-16 MED ORDER — FLUDEOXYGLUCOSE F - 18 (FDG) INJECTION: 19.0000 | Freq: Once | INTRAVENOUS | Status: AC | PRN

## 2013-06-16 MED ORDER — ENOXAPARIN SODIUM 100 MG/ML ~~LOC~~ SOLN
100.0000 mg | Freq: Once | SUBCUTANEOUS | Status: DC
  Filled 2013-06-16: qty 1

## 2013-06-16 MED ORDER — IOHEXOL 300 MG/ML  SOLN
100.0000 mL | Freq: Once | INTRAMUSCULAR | Status: AC | PRN
  Administered 2013-06-16: 100 mL via INTRAVENOUS

## 2013-06-16 NOTE — Telephone Encounter (Signed)
I spoke with the patient and her husband regarding the results of her scan.  She has had a significant response based on her latest PET and CT of chest/abdomen and pelvis.  CT of chest was notable however of an pulmonary embolus.    PLAN:  Start lovenox and coumadin.  A/C referral to pharmacy made.  Lovenox training with first SQ shot tomorrow  (100 mg SQ x one).  Prescription was sent for warfarin 5 mg daily.   Patient will follow-up with [place] on 11/24 for consideration for third cycle of R-MiniCHOP.

## 2013-06-16 NOTE — Telephone Encounter (Signed)
Per Nurse Olegario Messier added on Injection for tomorrow 11/19 sw pt and advised of date and time shh

## 2013-06-17 ENCOUNTER — Other Ambulatory Visit: Payer: Self-pay | Admitting: Medical Oncology

## 2013-06-17 ENCOUNTER — Other Ambulatory Visit: Payer: Self-pay | Admitting: Pharmacist

## 2013-06-17 ENCOUNTER — Encounter: Payer: Self-pay | Admitting: Family Medicine

## 2013-06-17 ENCOUNTER — Ambulatory Visit (HOSPITAL_BASED_OUTPATIENT_CLINIC_OR_DEPARTMENT_OTHER): Payer: Medicare Other

## 2013-06-17 ENCOUNTER — Other Ambulatory Visit: Payer: Self-pay | Admitting: Internal Medicine

## 2013-06-17 ENCOUNTER — Encounter: Payer: Self-pay | Admitting: Medical Oncology

## 2013-06-17 ENCOUNTER — Ambulatory Visit (INDEPENDENT_AMBULATORY_CARE_PROVIDER_SITE_OTHER): Payer: Medicare Other | Admitting: Family Medicine

## 2013-06-17 ENCOUNTER — Telehealth: Payer: Self-pay | Admitting: Medical Oncology

## 2013-06-17 VITALS — BP 134/91 | HR 131 | Temp 97.1°F

## 2013-06-17 VITALS — BP 120/70 | HR 126 | Wt 138.0 lb

## 2013-06-17 DIAGNOSIS — C8589 Other specified types of non-Hodgkin lymphoma, extranodal and solid organ sites: Secondary | ICD-10-CM

## 2013-06-17 DIAGNOSIS — I2699 Other pulmonary embolism without acute cor pulmonale: Secondary | ICD-10-CM | POA: Diagnosis not present

## 2013-06-17 DIAGNOSIS — R0989 Other specified symptoms and signs involving the circulatory and respiratory systems: Secondary | ICD-10-CM | POA: Diagnosis not present

## 2013-06-17 DIAGNOSIS — I5033 Acute on chronic diastolic (congestive) heart failure: Secondary | ICD-10-CM | POA: Diagnosis not present

## 2013-06-17 DIAGNOSIS — J209 Acute bronchitis, unspecified: Secondary | ICD-10-CM | POA: Diagnosis not present

## 2013-06-17 DIAGNOSIS — R0609 Other forms of dyspnea: Secondary | ICD-10-CM

## 2013-06-17 DIAGNOSIS — I519 Heart disease, unspecified: Secondary | ICD-10-CM | POA: Diagnosis not present

## 2013-06-17 DIAGNOSIS — R7989 Other specified abnormal findings of blood chemistry: Secondary | ICD-10-CM

## 2013-06-17 DIAGNOSIS — C8590 Non-Hodgkin lymphoma, unspecified, unspecified site: Secondary | ICD-10-CM

## 2013-06-17 DIAGNOSIS — I5189 Other ill-defined heart diseases: Secondary | ICD-10-CM

## 2013-06-17 MED ORDER — VALACYCLOVIR HCL 500 MG PO TABS: 500.0000 mg | ORAL_TABLET | Freq: Three times a day (TID) | ORAL | Status: DC

## 2013-06-17 MED ORDER — AZITHROMYCIN 500 MG PO TABS: 500.0000 mg | ORAL_TABLET | Freq: Every day | ORAL | Status: DC

## 2013-06-17 MED ORDER — ENOXAPARIN SODIUM 100 MG/ML ~~LOC~~ SOLN
100.0000 mg | Freq: Once | SUBCUTANEOUS | Status: AC
  Administered 2013-06-17: 100 mg via SUBCUTANEOUS
  Filled 2013-06-17: qty 1

## 2013-06-17 NOTE — Telephone Encounter (Signed)
Pt was here to get lovenox injection. She states that she has about 3 days left of her Valtrex. She is not sure if Dr. Rosie Fate would like for her to continue or get it refilled. Per Dr.Chism we will have her stop once she has completed what she has and re-evaluate her at her next MD visit. He voiced understanding.

## 2013-06-17 NOTE — Progress Notes (Signed)
  Subjective:    Patient ID: Tonya Wise, female    DOB: May 05, 1941, 72 y.o.   MRN: [id number]  HPI She is here for evaluation of rapid heart rate. She was recently diagnosed with a pulmonary embolus and presently is on Lovenox and Coumadin. Also in the last week he has developed a cough that has now become productive. No fever, chills, shortness of breath, chest pain. Recent blood work showed a hemoglobin of 9.8. She is scheduled for another round of chemotherapy Monday. Presently she is on no blood pressure medicine other than Lasix. She was taken off of her other medications at her request thinking it was causing difficulty with cough. Review of her record indicates difficulty with diastolic heart failure.   Review of Systems     Objective:   Physical Exam alert and in no distress. Tympanic membranes and canals are normal. Throat is clear. Tonsils are normal. Neck is supple without adenopathy or thyromegaly. Cardiac exam shows a regular sinus rhythm without murmurs or gallops. Lungs are clear to auscultation. EKG shows a tachycardia with T-wave inversions laterally. Recent lab work and CT scan were reviewed.      Assessment & Plan:  Acute on chronic diastolic heart failure - Plan: Brain natriuretic peptide, EKG 12-Lead, Ambulatory referral to Cardiology  Acute bronchitis - Plan: azithromycin (ZITHROMAX) 500 MG tablet  Non-Hodgkins lymphoma  Diastolic dysfunction - Plan: Brain natriuretic peptide, Ambulatory referral to Cardiology  Elevated LFTs - Plan: Ambulatory referral to Cardiology  Other dyspnea and respiratory abnormality - Plan: Brain natriuretic peptide, EKG 12-Lead, Ambulatory referral to Cardiology  the CT scan did show evidence of pulmonary and was however no evidence of pneumonia. She is having difficulty with productive cough and I feel it prudent to treat this. Although she is asymptomatic with her tachycardia, her EKG is abnormal. I did get her to commit to start  back on her Zebeta. Away on the BNP and may try to accelerate her appointment based on this.

## 2013-06-17 NOTE — Progress Notes (Signed)
Pt here to get lovenox injection. She will doing home injections. While pt received her injection of lovenox I discussed the steps and techniques. Pt and husband verbalized these instructions back to me. 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 I asked her to call if any questions or problems. She voiced understanding.

## 2013-06-17 NOTE — Patient Instructions (Signed)
Enoxaparin injection What is this medicine? ENOXAPARIN (ee nox a PA rin) is used after knee, hip, or abdominal surgeries to prevent blood clotting. It is also used to treat existing blood clots in the lungs or in the veins. This medicine may be used for other purposes; ask your health care provider or pharmacist if you have questions. COMMON BRAND NAME(S): Lovenox What should I tell my health care provider before I take this medicine? They need to know if you have any of these conditions: -bleeding disorders, hemorrhage, or hemophilia -infection of the heart or heart valves -kidney or liver disease -previous stroke -prosthetic heart valve -recent surgery or delivery of a baby -ulcer in the stomach or intestine, diverticulitis, or other bowel disease -an unusual or allergic reaction to enoxaparin, heparin, pork or pork products, other medicines, foods, dyes, or preservatives -pregnant or trying to get pregnant -breast-feeding How should I use this medicine? This medicine is for injection under the skin. It is usually given by a health-care professional. You or a family member may be trained on how to give the injections. If you are to give yourself injections, make sure you understand how to use the syringe, measure the dose if necessary, and give the injection. To avoid bruising, do not rub the site where this medicine has been injected. Do not take your medicine more often than directed. Do not stop taking except on the advice of your doctor or health care professional. Make sure you receive a puncture-resistant container to dispose of the needles and syringes once you have finished with them. Do not reuse these items. Return the container to your doctor or health care professional for proper disposal. Talk to your pediatrician regarding the use of this medicine in children. Special care may be needed. Overdosage: If you think you have taken too much of this medicine contact a poison control  center or emergency room at once. NOTE: This medicine is only for you. Do not share this medicine with others. What if I miss a dose? If you miss a dose, take it as soon as you can. If it is almost time for your next dose, take only that dose. Do not take double or extra doses. What may interact with this medicine? Do not take this medicine with any of the following medications: -aspirin and aspirin-like medicines -heparin -mifepristone -palifermin -warfarin  This medicine may also interact with the following medications: -cilostazol -clopidogrel -dipyridamole -NSAIDs, medicines for pain and inflammation, like ibuprofen or naproxen -sulfinpyrazone -ticlopidine This list may not describe all possible interactions. Give your health care provider a list of all the medicines, herbs, non-prescription drugs, or dietary supplements you use. Also tell them if you smoke, drink alcohol, or use illegal drugs. Some items may interact with your medicine. What should I watch for while using this medicine? Visit your doctor or health care professional for regular checks on your progress. Your condition will be monitored carefully while you are receiving this medicine. If you are going to have surgery, tell your doctor or health care professional that you are taking this medicine. Do not stop taking this medicine without first talking to your doctor. Be sure to refill your prescription before you run out of medicine. Avoid sports and activities that might cause injury while you are using this medicine. Severe falls or injuries can cause unseen bleeding. Be careful when using sharp tools or knives. Consider using an Neurosurgeon. Take special care brushing or flossing your teeth. Report any injuries,  bruising, or red spots on the skin to your doctor or health care professional. What side effects may I notice from receiving this medicine? Side effects that you should report to your doctor or health care  professional as soon as possible: -allergic reactions like skin rash, itching or hives, swelling of the face, lips, or tongue -dark urine -feeling faint or lightheaded, falls -fever -heavy menstrual bleeding -signs and symptoms of bleeding such as bloody or black, tarry stools; red or dark-brown urine; spitting up blood or brown material that looks like coffee grounds; red spots on the skin; unusual bruising or bleeding from the eye, gums, or nose -signs and symptoms of a blood clot such as breathing problems; changes in vision; chest pain; severe, sudden headache; pain, swelling, warmth in the leg; trouble speaking; sudden numbness or weakness of the face, arm, or leg Side effects that usually do not require medical attention (report to your doctor or health care professional if they continue or are bothersome): -pain or irritation at the injection site This list may not describe all possible side effects. Call your doctor for medical advice about side effects. You may report side effects to FDA at 1-800-FDA-1088. Where should I keep my medicine? Keep out of the reach of children. Store at room temperature between 15 and 30 degrees C (59 and 86 degrees F). Do not freeze. If your injections have been specially prepared, you may need to store them in the refrigerator. Ask your pharmacist. Throw away any unused medicine after the expiration date. NOTE: This sheet is a summary. It may not cover all possible information. If you have questions about this medicine, talk to your doctor, pharmacist, or health care provider.  2014, [employer] Standard. (2012-10-31 14:49:15)  Enoxaparin, Home Use Enoxaparin (Lovenox) injection is a medication used to prevent clots from developing in your veins. Medications such as enoxaparin are called blood thinners or anticoagulants. If blood clots are untreated they could travel to your lungs. This is called a pulmonary embolus. A blood clot in your lungs can be  fatal. Caregivers often use anticoagulants such as enoxaparin to prevent clots following surgery. It is also used along with aspirin when the heart is not getting enough blood. Continue the enoxaparin injections as directed by your caregiver. Your caregiver will use blood clotting test results to decide when you can safely stop using enoxaparin injections. If your caregiver prescribes any additional anticoagulant, you must take it exactly as directed. RISKS AND COMPLICATIONS  If you have received recent epidural anesthesia, spinal anesthesia, or a spinal tap while receiving anticoagulants, you are at risk for developing a blood clot in or around the spine. This condition could result in long-term or permanent paralysis.  Because anticoagulants thin your blood, severe bleeding may occur from any tissue or organ. Symptoms of the blood being too thin may include:  Bleeding from the nose or gums that does not stop quickly.  Unusual bruising or bruising easily.  Swelling or pain at an injection site.  A cut that does not stop bleeding within 10 minutes.  Continual nausea for more than 1 day or vomiting blood.  Coughing up blood.  Blood in the urine which may appear as pink, red, or brown urine.  Blood in bowel movements which may appear as red, dark or black stools.  Sudden weakness or numbness of the face, arm, or leg, especially on one side of the body.  Sudden confusion.  Trouble speaking (aphasia) or understanding.  Sudden trouble seeing  in one or both eyes.  Sudden trouble walking.  Dizziness.  Loss of balance or coordination.  Severe pain, such as a headache, joint pain, or back pain.  Fever.  Bruising around the injection sites may be expected.  Platelet drops, known as "thrombocytopenia," can occur with enoxaparin use. A condition called "heparin-induced thrombocytopenia" has been seen. If you have had this condition, you should tell your caregiver. Your caregiver may  direct you to have blood tests to monitor this condition.  Do not use if you have allergies to the medication, heparin, or pork products.  Other side effects may include mild local reactions or irritation at the site of injection, pain, bruising, and redness of skin. HOME CARE INSTRUCTIONS You will be instructed by your caregiver how to give enoxaparin injections. 1. Before giving your medication you should make sure the injection is a clear and colorless or pale yellow solution. If your medication becomes discolored or has particles in the bottle, do not use and notify your caregiver. 2. When using the 30 and 40 mg pre-filled syringes, do not expel the air bubble from the syringe before the injection. This makes sure you use all the medication in the syringe. 3. The injections will be given subcutaneously. This means it is given into the fat over the belly (abdomen). It is given deep beneath the skin but not into the muscle. The shots should be injected around the abdominal wall. Change the sites of injection each time. The whole length of the needle should be introduced into a skin fold held between the thumb and forefinger; the skin fold should be held throughout the injection. Do not rub the injection site after completion of the injection. This increases bruising. Enoxaparin injection pre-filled syringes and graduated pre-filled syringes are available with a system that shields the needle after injection. 4. Inject by pushing the plunger to the bottom of the syringe. 5. Remove the syringe from the injection site keeping your finger on the plunger rod. Be careful not to stick yourself or others. 6. After injection and the syringe is empty, set off the safety system by firmly pushing the plunger rod. The protective sleeve will automatically cover the needle and you can hear a click. The click means your needle is safely covered. Do not try replacing the needle shield. 7. Get rid of the syringe in the  nearest sharps container. 8. Keep your medication safely stored at room temperatures.  Due to the complications of anticoagulants, it is very important that you take your anticoagulant as directed by your caregiver. Anticoagulants need to be taken exactly as instructed. Be sure you understand all your anticoagulant instructions.  Changes in medicines, supplements, diet, and illness can affect your anticoagulation therapy. Be sure to inform your caregivers of any of these changes.  While on anticoagulants, you will need to have blood tests done routinely as directed by your caregivers.  Be careful not to cut yourself when using sharp objects.  Limit physical activities or sports that could result in a fall or cause injury.  It is extremely important that you tell all of your caregivers and dentist that you are taking an anticoagulant, especially if you are injured or plan to have any type of procedure or operation.  Follow up with your laboratory test and caregiver appointments as directed. It is very important to keep your appointments. Not keeping appointments could result in a chronic or permanent injury, pain, or disability. SEEK MEDICAL CARE IF:  You develop  any rashes.  You have any worsening of the condition for which you are receiving anticoagulation therapy. SEEK IMMEDIATE MEDICAL CARE IF:  Bleeding from the nose or gums does not stop quickly.  You have unusual bruising or are bruising easily.  Swelling or pain occurs at an injection site.  A cut does not stop bleeding within 10 minutes.  You have continual nausea for more than 1 day or are vomiting blood.  You are coughing up blood.  You have blood in the urine.  You have dark or black stools.  You have sudden weakness or numbness of the face, arm, or leg, especially on one side of the body.  You have sudden confusion.  You have trouble speaking (aphasia) or understanding.  You have sudden trouble seeing in one  or both eyes.  You have sudden trouble walking.  You have dizziness.  You have a loss of balance or coordination.  You have severe pain, such as a headache, joint pain, or back pain.  You have a serious fall or head injury, even if you are not bleeding.  You have an oral temperature above 102 F (38.9 C), not controlled by medicine. ANY OF THESE SYMPTOMS MAY REPRESENT A SERIOUS PROBLEM THAT IS AN EMERGENCY. Do not wait to see if the symptoms will go away. Get medical help right away. Call your local emergency services (911 in [place] DO NOT drive yourself to the hospital. MAKE SURE YOU:  Understand these instructions.  Will watch your condition.  Will get help right away if you are not doing well or get worse. Document Released: 05/17/2004 Document Revised: 10/08/2011 Document Reviewed: 07/16/2005 [facility] Patient Information 2014 [place], [place].

## 2013-06-18 ENCOUNTER — Encounter: Payer: Self-pay | Admitting: Internal Medicine

## 2013-06-18 ENCOUNTER — Telehealth: Payer: Self-pay

## 2013-06-18 DIAGNOSIS — C8589 Other specified types of non-Hodgkin lymphoma, extranodal and solid organ sites: Secondary | ICD-10-CM | POA: Diagnosis not present

## 2013-06-18 DIAGNOSIS — I129 Hypertensive chronic kidney disease with stage 1 through stage 4 chronic kidney disease, or unspecified chronic kidney disease: Secondary | ICD-10-CM | POA: Diagnosis not present

## 2013-06-18 DIAGNOSIS — IMO0001 Reserved for inherently not codable concepts without codable children: Secondary | ICD-10-CM | POA: Diagnosis not present

## 2013-06-18 DIAGNOSIS — D649 Anemia, unspecified: Secondary | ICD-10-CM | POA: Diagnosis not present

## 2013-06-18 DIAGNOSIS — I5030 Unspecified diastolic (congestive) heart failure: Secondary | ICD-10-CM | POA: Diagnosis not present

## 2013-06-18 LAB — BRAIN NATRIURETIC PEPTIDE: Brain Natriuretic Peptide: 547.6 pg/mL — ABNORMAL HIGH (ref 0.0–100.0)

## 2013-06-18 NOTE — Telephone Encounter (Signed)
CALLED [name] TO INFORM HIM WITH LAB WORK NOW IN HE WANTED Shaneese TO BE SEEN AT DR.KELLY'S OFFICE TODAY OR TOMORROW AND THEY HAVE APPOINTMENT 06/19/13 AT 10 AM HE VERBALIZED UNDERSTANDING

## 2013-06-19 ENCOUNTER — Ambulatory Visit (INDEPENDENT_AMBULATORY_CARE_PROVIDER_SITE_OTHER): Payer: Medicare Other | Admitting: Cardiovascular Disease

## 2013-06-19 ENCOUNTER — Encounter: Payer: Self-pay | Admitting: Cardiovascular Disease

## 2013-06-19 VITALS — BP 120/70 | HR 87 | Ht 62.0 in | Wt 132.0 lb

## 2013-06-19 DIAGNOSIS — Z13220 Encounter for screening for lipoid disorders: Secondary | ICD-10-CM

## 2013-06-19 DIAGNOSIS — I2699 Other pulmonary embolism without acute cor pulmonale: Secondary | ICD-10-CM

## 2013-06-19 DIAGNOSIS — E43 Unspecified severe protein-calorie malnutrition: Secondary | ICD-10-CM

## 2013-06-19 DIAGNOSIS — R5381 Other malaise: Secondary | ICD-10-CM

## 2013-06-19 DIAGNOSIS — C8589 Other specified types of non-Hodgkin lymphoma, extranodal and solid organ sites: Secondary | ICD-10-CM

## 2013-06-19 DIAGNOSIS — R9431 Abnormal electrocardiogram [ECG] [EKG]: Secondary | ICD-10-CM | POA: Diagnosis not present

## 2013-06-19 DIAGNOSIS — Z79899 Other long term (current) drug therapy: Secondary | ICD-10-CM

## 2013-06-19 DIAGNOSIS — C8590 Non-Hodgkin lymphoma, unspecified, unspecified site: Secondary | ICD-10-CM

## 2013-06-19 DIAGNOSIS — I5033 Acute on chronic diastolic (congestive) heart failure: Secondary | ICD-10-CM

## 2013-06-19 DIAGNOSIS — E119 Type 2 diabetes mellitus without complications: Secondary | ICD-10-CM

## 2013-06-19 DIAGNOSIS — I10 Essential (primary) hypertension: Secondary | ICD-10-CM | POA: Diagnosis not present

## 2013-06-19 DIAGNOSIS — R5383 Other fatigue: Secondary | ICD-10-CM

## 2013-06-19 MED ORDER — LISINOPRIL 2.5 MG PO TABS: 2.5000 mg | ORAL_TABLET | Freq: Every day | ORAL | Status: DC

## 2013-06-19 NOTE — Patient Instructions (Addendum)
Your physician has requested that you have a lexiscan myoview. For further information please visit [email or web address] Please follow instruction sheet, as given. This will be done in about 2 weeks.  Your physician recommends that you return for lab work fasting.  Your physician recommends that you schedule a follow-up appointment in: 3-4 weeks.  Your physician has recommended you make the following change in your medication: start new prescription given for lisinopril. This has already been sent to the pharmacy.

## 2013-06-21 ENCOUNTER — Encounter: Payer: Self-pay | Admitting: Cardiovascular Disease

## 2013-06-21 ENCOUNTER — Other Ambulatory Visit: Payer: Self-pay | Admitting: Internal Medicine

## 2013-06-21 NOTE — Progress Notes (Signed)
Patient ID: Tonya Wise, female   DOB: Apr 25, 1941, 72 y.o.   MRN: [id number]     PATIENT PROFILE:  Tonya Wise is a 72 year old female who is referred through the courtesy of Dr. Susann Givens for evaluation of recent increased heart rate in this patient status post documentation of recent coronary embolism.   HPI: Tonya Wise is a 72 year old female without known prior cardiac history. Apparently, the patient developed anemia and dehydration this past summer and ultimately was diagnosed as having non-Hodgkin's lymphoma. She has received 2 treatments of chemotherapy. Her 06/16/2013 she underwent a followup CT scan which showed slight interval reduction in axillary lymphadenopathy but there was a 1 small residual right axillary lymph node measuring 16 a 12 mm. She also is noted a significant reduction in mediastinal and hilar adenopathy. However, it was noted that she had a left-sided pulmonary embolism. There also was resolution of bulky abdominal lymphadenopathy as well as interval decrease in the size of her spleen since initiating chemotherapy. Because of the diagnosis of left-sided pulmonary embolism she apparently started therapy with combination Lovenox as well as oral Coumadin. She apparently was seen in Dr. Jola Babinski office on 06/17/2013 with complaints of increasing cough that has become productive. She was started on Zithromax for possible acute bronchitis. She was tachycardic and did have T-wave changes there were new part 2 older EKGs. She presents now for evaluation.  The patient denies recent chest tightness. Apparently she does have poor memory of some of the events that transpired over the summer. She denies recent bleeding episodes. In review of her medical record does indicate that she underwent a 2-D echo Doppler study in September 2014 while she was hospitalized at [facility]. Ejection fraction was 50-55%. She had multiple hypertension with estimated RV systolic pressure  at 39 mm.. Grade 1 diastolic dysfunction.  Past Medical History  Diagnosis Date  . Hypertension   . Renal insufficiency   . Osteopenia   . Allergy   . Heart murmur   . GERD (gastroesophageal reflux disease)   . Anemia   . Hypercalcemia 03/27/2013  . Non-Hodgkins lymphoma 04/06/2013  . Diastolic dysfunction, grade I 1/47/8295    Past Surgical History  Procedure Laterality Date  . Appendectomy    . Colonoscopy  2008  . Spine surgery    . Back surgery      LOWER BACK TWICE  . Abdominal hysterectomy    . Esophagogastroduodenoscopy N/A 03/29/2013    Procedure: ESOPHAGOGASTRODUODENOSCOPY (EGD);  Surgeon: Charna Elizabeth, MD;  Location: [facility] ENDOSCOPY;  Service: Endoscopy;  Laterality: N/A;  . Inguinal lymph node biopsy Right 03/31/2013    Procedure: INGUINAL LYMPH NODE BIOPSY;  Surgeon: Cherylynn Ridges, MD;  Location: [facility] OR;  Service: General;  Laterality: Right;    Allergies  Allergen Reactions  . Penicillins Rash    Current Outpatient Prescriptions  Medication Sig Dispense Refill  . albuterol (PROVENTIL) (5 MG/ML) 0.5% nebulizer solution Take 0.5 mLs (2.5 mg total) by nebulization every 4 (four) hours as needed for wheezing or shortness of breath.  20 mL  12  . allopurinol (ZYLOPRIM) 300 MG tablet Take 1 tablet (300 mg total) by mouth daily.  30 tablet  3  . aspirin 81 MG tablet Take 1 tablet (81 mg total) by mouth daily.  30 tablet    . azithromycin (ZITHROMAX) 500 MG tablet Take 1 tablet (500 mg total) by mouth daily.  3 tablet  0  . chlorpheniramine-HYDROcodone (TUSSIONEX PENNKINETIC ER) 10-8  MG/5ML LQCR Take 5 mLs by mouth every 12 (twelve) hours as needed.  120 mL  0  . dextromethorphan (DELSYM) 30 MG/5ML liquid Take 10 mLs (60 mg total) by mouth 2 (two) times daily.  89 mL  0  . enoxaparin (LOVENOX) 40 MG/0.4ML injection Inject 1 mL (100 mg total) into the skin daily.  5 Syringe  0  . feeding supplement (ENSURE COMPLETE) LIQD Take 237 mLs by mouth 3 (three) times daily with meals.   10 Bottle  1  . furosemide (LASIX) 20 MG tablet Take 1 tablet (20 mg total) by mouth daily.  30 tablet  3  . nystatin (MYCOSTATIN) 100000 UNIT/ML suspension Take 5 mLs (500,000 Units total) by mouth 4 (four) times daily as needed. Take 5 ml po swish and swallow  Or  Swish and spit  Four times daily as needed for mouth thrush.  480 mL  0  . ondansetron (ZOFRAN) 4 MG tablet Take 1 tablet (4 mg total) by mouth every 6 (six) hours as needed for nausea.  20 tablet  0  . warfarin (COUMADIN) 5 MG tablet Take 1 tablet (5 mg total) by mouth daily at 6 PM.  30 tablet  1  . bisoprolol (ZEBETA) 10 MG tablet Take 1 tablet by mouth daily.      Marland Kitchen lisinopril (PRINIVIL,ZESTRIL) 2.5 MG tablet Take 1 tablet (2.5 mg total) by mouth daily.  30 tablet  6  . omeprazole (PRILOSEC) 40 MG capsule Take 1 capsule by mouth daily.      Marland Kitchen oxyCODONE (OXY IR/ROXICODONE) 5 MG immediate release tablet Take 1 tablet by mouth daily.       No current facility-administered medications for this visit.    Social history is notable in that she is married for 54 years. 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 She has 2 children and 2 grandchildren. No tobacco use presently. She quit remotely in 1962. There is no alcohol use. She does not routinely exercise.  Family History  Problem Relation Age of Onset  . Heart attack Father 24  . Leukemia Mother   . Diabetes Brother   . Diabetes Sister     ROS is negative for fever chills or night sweats. She denies skin rash. She denies visual changes. There is no change in hearing. She does have non-Hodgkin's lymphoma. She recently had increased cough. She was diagnosed with a left ovarian embolism by recent CT was also shown reduction in previous adenopathy. She doesn't a mild shortness of breath with activity. There is no chest tightness. She denies abdominal complaints presently. She denies blood in her stool or urine. There is no claudication. Times his transient leg swelling.  There is no cold or heat intolerance. She does have a prescription for albuterol which she takes when necessary wheezing. She does have protein calorie malnutrition for which he takes Ensure complete feeding supplement. She denies on any bone discomfort. At times she does have intermittent back discomfort. Other comprehensive 14 point system review is negative.  PE BP 120/70  Pulse 87  Ht 5\' 2"  (1.575 m)  Wt 132 lb (59.875 kg)  BMI 24.14 kg/m2 General: Alert, oriented, no distress.  Skin: normal turgor, no rashes HEENT: Normocephalic, atraumatic. Pupils round and reactive; sclera anicteric; Fundi without hemorrhages or exudates. No papilledema Nose without nasal septal hypertrophy 2 Mouth/Parynx benign; Mallinpatti scale Neck: No JVD, no carotid bruits Lungs: clear to ausculatation and percussion; no wheezing or rales Heart: RRR, s1 s2  normal 1/6 systolic murmur. Abdomen: soft, nontender; no hepatosplenomehaly, BS+; abdominal aorta nontender and not dilated by palpation. Pulses 2+ Extremities: no clubbinbg cyanosis or edema, Homan's sign negative  Neurologic: grossly nonfocal Psychologic: Normal mood and affect   ECG: I did review the EKG taken 06/17/2013 at Dr. Edwin Dada office. That EKG showed sinus tachycardia 117 beats per minute with inferolateral and anterolateral T wave abnormalities. EKG today taken in our office now shows sinus rhythm 87 beats per minute. She does have slightly more pronounced T wave inversion in leads II, III, and F the 3 through V6.  LABS:  BMET    Component Value Date/Time   NA 141 06/15/2013 1008   NA 137 04/29/2013 0500   K 3.9 06/15/2013 1008   K 3.5 04/29/2013 0500   CL 103 04/29/2013 0500   CO2 24 06/15/2013 1008   CO2 25 04/29/2013 0500   GLUCOSE 158* 06/15/2013 1008   GLUCOSE 176* 04/29/2013 0500   BUN 8.7 06/15/2013 1008   BUN 16 04/29/2013 0500   CREATININE 1.0 06/15/2013 1008   CREATININE 0.96 04/29/2013 0500   CREATININE 1.37* 03/26/2013 1338     CALCIUM 9.0 06/15/2013 1008   CALCIUM 7.0* 04/29/2013 0500   GFRNONAA 58* 04/29/2013 0500   GFRAA 67* 04/29/2013 0500     Hepatic Function Panel     Component Value Date/Time   PROT 4.9* 06/08/2013 0958   PROT 4.0* 04/25/2013 1410   ALBUMIN 3.0* 06/08/2013 0958   ALBUMIN 1.5* 04/25/2013 1410   AST 14 06/08/2013 0958   AST 35 04/25/2013 1410   ALT 8 06/08/2013 0958   ALT 16 04/25/2013 1410   ALKPHOS 94 06/08/2013 0958   ALKPHOS 154* 04/25/2013 1410   BILITOT 0.64 06/08/2013 0958   BILITOT 1.0 04/25/2013 1410     CBC    Component Value Date/Time   WBC 6.2 06/15/2013 1008   WBC 1.7* 04/29/2013 0500   RBC 3.16* 06/15/2013 1008   RBC 3.18* 04/29/2013 0500   HGB 9.8* 06/15/2013 1008   HGB 8.4* 04/29/2013 0500   HCT 30.3* 06/15/2013 1008   HCT 26.0* 04/29/2013 0500   PLT 134* 06/15/2013 1008   PLT 76* 04/29/2013 0500   MCV 96.1 06/15/2013 1008   MCV 81.8 04/29/2013 0500   MCH 31.0 06/15/2013 1008   MCH 26.4 04/29/2013 0500   MCHC 32.3 06/15/2013 1008   MCHC 32.3 04/29/2013 0500   RDW 20.9* 06/15/2013 1008   RDW 18.3* 04/29/2013 0500   LYMPHSABS 0.9 06/15/2013 1008   LYMPHSABS 0.4* 04/29/2013 0500   MONOABS 0.5 06/15/2013 1008   MONOABS 0.4 04/29/2013 0500   EOSABS 0.1 06/15/2013 1008   EOSABS 0.0 04/29/2013 0500   BASOSABS 0.1 06/15/2013 1008   BASOSABS 0.0 04/29/2013 0500     BNP    Component Value Date/Time   PROBNP 16301.0* 04/22/2013 0530    Lipid Panel  No results found for this basename: chol, trig, hdl, cholhdl, vldl, ldlcalc     RADIOLOGY: Ct Chest W Contrast  06/16/2013   CLINICAL DATA:  Non-Hodgkin's lymphoma.  EXAM: CT CHEST, ABDOMEN, AND PELVIS WITH CONTRAST  TECHNIQUE: Multidetector CT imaging of the chest, abdomen and pelvis was performed following the standard protocol during bolus administration of intravenous contrast.  CONTRAST:  OMNIPAQUE IOHEXOL 300 MG/ML  SOLN  COMPARISON:  03/30/2013.  FINDINGS: CT CHEST FINDINGS  The chest wall demonstrates a  right-sided Port-A-Cath in good position without complicating features. Bilateral breast prosthesis are noted. The bulky  bilateral axillary lymphadenopathy has resolved. There is a small residual right axillary node on image number 12 which measures 16 x 12 mm. It previously measured 27 x 20 mm. It did show slight hypermetabolism on the PET scan. Other normal size axillary lymph nodes did not show any hyper metabolism.  The bony thorax is intact. No destructive bone lesions or spinal canal compromise.  The heart is normal in size. No pericardial effusion. No mediastinal or hilar mass. The bulky lymphadenopathy seen on the prior study has significantly decreased in size. The subcarinal nodal mass previously measured 4.0 x 2.2 cm and now measures 2.0 x 1.3 cm. The aorta is normal in caliber. Stable atherosclerotic calcifications. There is clot noted in the left lower lobe pulmonary artery consistent with pulmonary embolism. There is also a small amount of string like adherent clot in the main left pulmonary artery.  Examination of the lung parenchyma demonstrates no worrisome pulmonary nodules. Stable area of subpleural scarring type changes in the right middle lobe. There is a small right pleural effusion with overlying atelectasis.  CT ABDOMEN AND PELVIS FINDINGS  The liver demonstrates diffuse fatty infiltration but no focal hepatic lesions or biliary dilatation. The gallbladder is normal. No common bowel duct dilatation. The pancreas is normal. The spleen remains mildly enlarged and there are multiple low-attenuation lesions. The adrenal glands and kidneys are unremarkable and unchanged.  The stomach, duodenum, small bowel and colon are unremarkable. No inflammatory changes, mass lesions or obstructive findings. There are small scattered mesenteric and retroperitoneal lymph nodes. This reflects a significant improvement since the prior CT scan where there was bulky adenopathy. The aorta and branch vessels are  patent. Stable atherosclerotic calcifications.  The uterus and ovaries are unremarkable and stable. The bladder is normal. No pelvic mass, adenopathy or free pelvic fluid collections. No inguinal mass or adenopathy. The borderline inguinal lymph nodes have resolved.  No significant bony findings.  Stable lumbar fusion changes.  IMPRESSION: 1. Significant interval reduction in axillary lymphadenopathy. There is 1 small residual right axillary lymph node measuring 16 x 12 mm which did show FDG uptake. 2. Significant reduction in mediastinal and hilar lymphadenopathy. 3. Left-sided pulmonary embolism. 4. Resolution of bulky abdominal lymphadenopathy. 5. Interval decrease in size of the spleen (15 x 14 x 10.5 versus 12 x 12 x 9.5). Persistent small lesions.   Electronically Signed   By: Loralie Champagne M.D.   On: 06/16/2013 15:11   Ct Abdomen Pelvis W Contrast  06/16/2013   CLINICAL DATA:  Non-Hodgkin's lymphoma.  EXAM: CT CHEST, ABDOMEN, AND PELVIS WITH CONTRAST  TECHNIQUE: Multidetector CT imaging of the chest, abdomen and pelvis was performed following the standard protocol during bolus administration of intravenous contrast.  CONTRAST:  OMNIPAQUE IOHEXOL 300 MG/ML  SOLN  COMPARISON:  03/30/2013.  FINDINGS: CT CHEST FINDINGS  The chest wall demonstrates a right-sided Port-A-Cath in good position without complicating features. Bilateral breast prosthesis are noted. The bulky bilateral axillary lymphadenopathy has resolved. There is a small residual right axillary node on image number 12 which measures 16 x 12 mm. It previously measured 27 x 20 mm. It did show slight hypermetabolism on the PET scan. Other normal size axillary lymph nodes did not show any hyper metabolism.  The bony thorax is intact. No destructive bone lesions or spinal canal compromise.  The heart is normal in size. No pericardial effusion. No mediastinal or hilar mass. The bulky lymphadenopathy seen on the prior study has significantly  decreased in  size. The subcarinal nodal mass previously measured 4.0 x 2.2 cm and now measures 2.0 x 1.3 cm. The aorta is normal in caliber. Stable atherosclerotic calcifications. There is clot noted in the left lower lobe pulmonary artery consistent with pulmonary embolism. There is also a small amount of string like adherent clot in the main left pulmonary artery.  Examination of the lung parenchyma demonstrates no worrisome pulmonary nodules. Stable area of subpleural scarring type changes in the right middle lobe. There is a small right pleural effusion with overlying atelectasis.  CT ABDOMEN AND PELVIS FINDINGS  The liver demonstrates diffuse fatty infiltration but no focal hepatic lesions or biliary dilatation. The gallbladder is normal. No common bowel duct dilatation. The pancreas is normal. The spleen remains mildly enlarged and there are multiple low-attenuation lesions. The adrenal glands and kidneys are unremarkable and unchanged.  The stomach, duodenum, small bowel and colon are unremarkable. No inflammatory changes, mass lesions or obstructive findings. There are small scattered mesenteric and retroperitoneal lymph nodes. This reflects a significant improvement since the prior CT scan where there was bulky adenopathy. The aorta and branch vessels are patent. Stable atherosclerotic calcifications.  The uterus and ovaries are unremarkable and stable. The bladder is normal. No pelvic mass, adenopathy or free pelvic fluid collections. No inguinal mass or adenopathy. The borderline inguinal lymph nodes have resolved.  No significant bony findings.  Stable lumbar fusion changes.  IMPRESSION: 1. Significant interval reduction in axillary lymphadenopathy. There is 1 small residual right axillary lymph node measuring 16 x 12 mm which did show FDG uptake. 2. Significant reduction in mediastinal and hilar lymphadenopathy. 3. Left-sided pulmonary embolism. 4. Resolution of bulky abdominal lymphadenopathy. 5.  Interval decrease in size of the spleen (15 x 14 x 10.5 versus 12 x 12 x 9.5). Persistent small lesions.   Electronically Signed   By: Loralie Champagne M.D.   On: 06/16/2013 15:11   Nm Pet Image Restag (ps) Skull Base To Thigh  06/16/2013   CLINICAL DATA:  Subsequent treatment strategy for non-Hodgkin's lymphoma.  EXAM: NUCLEAR MEDICINE PET SKULL BASE TO THIGH  FASTING BLOOD GLUCOSE:  Value: 105mg /dl  TECHNIQUE: 16.1 mCi W-96 FDG was injected intravenously. CT data was obtained and used for attenuation correction and anatomic localization only. (This was not acquired as a diagnostic CT examination.) Additional exam technical data entered on technologist worksheet.  COMPARISON:  CT scan 03/30/2013.  FINDINGS: NECK  No hypermetabolic lymph nodes in the neck.  CHEST  There is a single right axillary lymph node measuring 16 x 10 mm in demonstrates FDG uptake with SUV max of 5.5. This would suggest some residual metabolically active lymphoma. The mediastinal and hilar lymph nodes are much smaller than a previous chest CT and I do not see any definite metabolic activity. No worrisome pulmonary lesions. The right middle lobe subpleural density is relatively stable and is likely scarring or atelectasis. No abnormal FDG uptake is identified. There is a small right pleural effusion.  ABDOMEN/PELVIS  No persistence/residual lymphadenopathy in the abdomen. A few small scattered lymph nodes are noted but the bulky adenopathy has resolved. No metabolic activity is identified in the remaining small lymph nodes. Stable mild splenomegaly with numerous small lesions but no hyper metabolism.  SKELETON  Diffuse osseous uptake is likely due to rebound marrow activity or marrow stimulating drugs.  IMPRESSION: 1. Single metabolically active 16 x 10 mm in the right axilla. 2. Persistent but definitely smaller mediastinal and hilar lymph node without definite metabolic  activity. 3. Resolution of abdominal adenopathy with some scattered  residual lymph nodes but no metabolic activity. 4. Persistent mild splenomegaly and small splenic lesions without obvious hyper metabolism.   Electronically Signed   By: Loralie Champagne M.D.   On: 06/16/2013 14:38     ASSESSMENT AND PLAN: My impression is that [name] is a 72 year old female with recent diagnosis of non-Hodgkin's lymphoma initially has been demonstrated to have some improvement in her prior more significant adenopathy. An echo Doppler study done in September did show normal systolic function with diastolic dysfunction. Her BMP was elevated at that time suggesting congestive heart failure. She was recently diagnosed on followup CT this week of having pulmonary emboli for which she is now being treated with Coumadin and Lovenox per oncology/hematology. She was tachycardic 2 days ago at Dr. Jola Babinski office. Her ECG today does show improvement in her resting heart rate but she continues to have persistent T wave abnormalities which appear perhaps minimally additionally inverted. I am recommending followup laboratory be obtained with a cemented, CBC lipid panel and TSH level. Initiating very low dose ACE inhibitor with lisinopril 2.5 mg. She is on Zebeta  for beta-blockade 10 mg. I am scheduling her for LexiScan Myoview study to be done over the next 2 weeks and I will see her back in the office the following week for further evaluation.   Lennette Bihari, MD, [facility] 06/21/2013 12:33 PM

## 2013-06-22 ENCOUNTER — Telehealth: Payer: Self-pay | Admitting: Internal Medicine

## 2013-06-22 ENCOUNTER — Ambulatory Visit (HOSPITAL_BASED_OUTPATIENT_CLINIC_OR_DEPARTMENT_OTHER): Payer: Medicare Other | Admitting: Internal Medicine

## 2013-06-22 ENCOUNTER — Ambulatory Visit (HOSPITAL_BASED_OUTPATIENT_CLINIC_OR_DEPARTMENT_OTHER): Payer: Medicare Other | Admitting: Pharmacist

## 2013-06-22 ENCOUNTER — Encounter: Payer: Self-pay | Admitting: Medical Oncology

## 2013-06-22 ENCOUNTER — Encounter: Payer: Self-pay | Admitting: Internal Medicine

## 2013-06-22 ENCOUNTER — Other Ambulatory Visit (HOSPITAL_BASED_OUTPATIENT_CLINIC_OR_DEPARTMENT_OTHER): Payer: Medicare Other | Admitting: Lab

## 2013-06-22 ENCOUNTER — Ambulatory Visit (HOSPITAL_BASED_OUTPATIENT_CLINIC_OR_DEPARTMENT_OTHER): Payer: Medicare Other

## 2013-06-22 ENCOUNTER — Other Ambulatory Visit: Payer: Self-pay | Admitting: Medical Oncology

## 2013-06-22 ENCOUNTER — Encounter: Payer: Self-pay | Admitting: Cardiovascular Disease

## 2013-06-22 VITALS — BP 107/71 | HR 78 | Temp 97.1°F | Resp 18

## 2013-06-22 VITALS — BP 115/72 | HR 81 | Temp 97.7°F | Resp 18 | Ht 62.0 in | Wt 137.5 lb

## 2013-06-22 DIAGNOSIS — Z7901 Long term (current) use of anticoagulants: Secondary | ICD-10-CM

## 2013-06-22 DIAGNOSIS — E876 Hypokalemia: Secondary | ICD-10-CM

## 2013-06-22 DIAGNOSIS — D61810 Antineoplastic chemotherapy induced pancytopenia: Secondary | ICD-10-CM | POA: Diagnosis not present

## 2013-06-22 DIAGNOSIS — C8590 Non-Hodgkin lymphoma, unspecified, unspecified site: Secondary | ICD-10-CM

## 2013-06-22 DIAGNOSIS — I2699 Other pulmonary embolism without acute cor pulmonale: Secondary | ICD-10-CM | POA: Diagnosis not present

## 2013-06-22 DIAGNOSIS — B3781 Candidal esophagitis: Secondary | ICD-10-CM

## 2013-06-22 DIAGNOSIS — B379 Candidiasis, unspecified: Secondary | ICD-10-CM

## 2013-06-22 DIAGNOSIS — C8589 Other specified types of non-Hodgkin lymphoma, extranodal and solid organ sites: Secondary | ICD-10-CM

## 2013-06-22 DIAGNOSIS — E46 Unspecified protein-calorie malnutrition: Secondary | ICD-10-CM

## 2013-06-22 DIAGNOSIS — Z5111 Encounter for antineoplastic chemotherapy: Secondary | ICD-10-CM

## 2013-06-22 DIAGNOSIS — Z5112 Encounter for antineoplastic immunotherapy: Secondary | ICD-10-CM | POA: Diagnosis not present

## 2013-06-22 LAB — CBC WITH DIFFERENTIAL/PLATELET
BASO%: 1.8 % (ref 0.0–2.0)
Basophils Absolute: 0.1 10*3/uL (ref 0.0–0.1)
Eosinophils Absolute: 0 10*3/uL (ref 0.0–0.5)
HCT: 29.9 % — ABNORMAL LOW (ref 34.8–46.6)
HGB: 9.9 g/dL — ABNORMAL LOW (ref 11.6–15.9)
MCH: 31.3 pg (ref 25.1–34.0)
MCHC: 33.1 g/dL (ref 31.5–36.0)
MCV: 94.6 fL (ref 79.5–101.0)
MONO%: 10.5 % (ref 0.0–14.0)
NEUT#: 3.1 10*3/uL (ref 1.5–6.5)
NEUT%: 62.2 % (ref 38.4–76.8)
Platelets: 153 10*3/uL (ref 145–400)
RDW: 19.9 % — ABNORMAL HIGH (ref 11.2–14.5)
lymph#: 1.2 10*3/uL (ref 0.9–3.3)

## 2013-06-22 LAB — COMPREHENSIVE METABOLIC PANEL (CC13)
Albumin: 2.9 g/dL — ABNORMAL LOW (ref 3.5–5.0)
Anion Gap: 9 mEq/L (ref 3–11)
BUN: 6.6 mg/dL — ABNORMAL LOW (ref 7.0–26.0)
CO2: 23 mEq/L (ref 22–29)
Glucose: 95 mg/dl (ref 70–140)
Potassium: 3.3 mEq/L — ABNORMAL LOW (ref 3.5–5.1)
Sodium: 140 mEq/L (ref 136–145)
Total Bilirubin: 0.3 mg/dL (ref 0.20–1.20)
Total Protein: 4.8 g/dL — ABNORMAL LOW (ref 6.4–8.3)

## 2013-06-22 LAB — PROTIME-INR
INR: 4.6 — ABNORMAL HIGH (ref 2.00–3.50)
Protime: 55.2 Seconds — ABNORMAL HIGH (ref 10.6–13.4)

## 2013-06-22 LAB — POCT INR: INR: 4.6

## 2013-06-22 MED ORDER — LIDOCAINE-PRILOCAINE 2.5-2.5 % EX CREA: 1.0000 "application " | TOPICAL_CREAM | CUTANEOUS | Status: DC | PRN

## 2013-06-22 MED ORDER — DEXAMETHASONE SODIUM PHOSPHATE 20 MG/5ML IJ SOLN
INTRAMUSCULAR | Status: AC
  Filled 2013-06-22: qty 5

## 2013-06-22 MED ORDER — ONDANSETRON 16 MG/50ML IVPB (CHCC)
16.0000 mg | Freq: Once | INTRAVENOUS | Status: AC
  Administered 2013-06-22: 16 mg via INTRAVENOUS

## 2013-06-22 MED ORDER — DIPHENHYDRAMINE HCL 25 MG PO CAPS
50.0000 mg | ORAL_CAPSULE | Freq: Once | ORAL | Status: AC
  Administered 2013-06-22: 50 mg via ORAL

## 2013-06-22 MED ORDER — ACETAMINOPHEN 325 MG PO TABS
650.0000 mg | ORAL_TABLET | Freq: Once | ORAL | Status: AC
  Administered 2013-06-22: 650 mg via ORAL

## 2013-06-22 MED ORDER — ONDANSETRON 16 MG/50ML IVPB (CHCC)
INTRAVENOUS | Status: AC
  Filled 2013-06-22: qty 16

## 2013-06-22 MED ORDER — FLUCONAZOLE 200 MG PO TABS: 200.0000 mg | ORAL_TABLET | Freq: Every day | ORAL | Status: DC

## 2013-06-22 MED ORDER — DEXAMETHASONE SODIUM PHOSPHATE 20 MG/5ML IJ SOLN
20.0000 mg | Freq: Once | INTRAMUSCULAR | Status: AC
  Administered 2013-06-22: 20 mg via INTRAVENOUS

## 2013-06-22 MED ORDER — SODIUM CHLORIDE 0.9 % IV SOLN
375.0000 mg/m2 | Freq: Once | INTRAVENOUS | Status: AC
  Administered 2013-06-22: 600 mg via INTRAVENOUS
  Filled 2013-06-22: qty 60

## 2013-06-22 MED ORDER — DOXORUBICIN HCL CHEMO IV INJECTION 2 MG/ML
25.0000 mg/m2 | Freq: Once | INTRAVENOUS | Status: AC
  Administered 2013-06-22: 44 mg via INTRAVENOUS
  Filled 2013-06-22: qty 22

## 2013-06-22 MED ORDER — HEPARIN SOD (PORK) LOCK FLUSH 100 UNIT/ML IV SOLN
500.0000 [IU] | Freq: Once | INTRAVENOUS | Status: AC | PRN
  Administered 2013-06-22: 500 [IU]
  Filled 2013-06-22: qty 5

## 2013-06-22 MED ORDER — SODIUM CHLORIDE 0.9 % IV SOLN
Freq: Once | INTRAVENOUS | Status: AC
  Administered 2013-06-22: 12:00:00 via INTRAVENOUS

## 2013-06-22 MED ORDER — ACETAMINOPHEN 325 MG PO TABS
ORAL_TABLET | ORAL | Status: AC
  Filled 2013-06-22: qty 2

## 2013-06-22 MED ORDER — SODIUM CHLORIDE 0.9 % IV SOLN
1.0000 mg | Freq: Once | INTRAVENOUS | Status: AC
  Administered 2013-06-22: 1 mg via INTRAVENOUS
  Filled 2013-06-22: qty 1

## 2013-06-22 MED ORDER — POTASSIUM CHLORIDE ER 10 MEQ PO TBCR: 10.0000 meq | EXTENDED_RELEASE_TABLET | Freq: Two times a day (BID) | ORAL | Status: DC

## 2013-06-22 MED ORDER — SODIUM CHLORIDE 0.9 % IV SOLN
400.0000 mg/m2 | Freq: Once | INTRAVENOUS | Status: AC
  Administered 2013-06-22: 700 mg via INTRAVENOUS
  Filled 2013-06-22: qty 35

## 2013-06-22 MED ORDER — VALACYCLOVIR HCL 500 MG PO TABS: 500.0000 mg | ORAL_TABLET | Freq: Every day | ORAL | Status: DC

## 2013-06-22 MED ORDER — DIPHENHYDRAMINE HCL 25 MG PO CAPS
ORAL_CAPSULE | ORAL | Status: AC
  Filled 2013-06-22: qty 2

## 2013-06-22 MED ORDER — SODIUM CHLORIDE 0.9 % IJ SOLN
10.0000 mL | INTRAMUSCULAR | Status: DC | PRN
  Administered 2013-06-22: 10 mL
  Filled 2013-06-22: qty 10

## 2013-06-22 NOTE — Progress Notes (Signed)
INR above goal of 2-3 for PE.  Tonya Wise started coumadin/lovenox on 06/17/13.  She has been taking coumadin 5mg  daily.  Coumadin education reviewed including indication, INR testing, S/S of bleeding, Rx and OTC drug interaction, herbal medications, Vit K containing foods and EtOH intake.  Dr Rosie Fate will give Tonya Wise fluconazole x 1 today which can further increase INR.  Will hold coumadin today and tomorrow.  Resume Coumadin at 4mg  daily and check PT/INR on Friday.  Coumadin 4mg  x 10 samples given [id number] Exp10/2015

## 2013-06-22 NOTE — Telephone Encounter (Signed)
Added appts for 12/1, 12/15 and 12/16. S/w pt and she will get new schedule when she comes in tomorrow.

## 2013-06-22 NOTE — Patient Instructions (Signed)
[facility] Discharge Instructions for Patients Receiving Chemotherapy  Today you received the following chemotherapy agents: Adriamycin, Vincristine, Cytoxan, Rituxan  To help prevent nausea and vomiting after your treatment, we encourage you to take your nausea medication as instructed by your physician.    If you develop nausea and vomiting that is not controlled by your nausea medication, call the clinic.   BELOW ARE SYMPTOMS THAT SHOULD BE REPORTED IMMEDIATELY:  *FEVER GREATER THAN 100.5 F  *CHILLS WITH OR WITHOUT FEVER  NAUSEA AND VOMITING THAT IS NOT CONTROLLED WITH YOUR NAUSEA MEDICATION  *UNUSUAL SHORTNESS OF BREATH  *UNUSUAL BRUISING OR BLEEDING  TENDERNESS IN MOUTH AND THROAT WITH OR WITHOUT PRESENCE OF ULCERS  *URINARY PROBLEMS  *BOWEL PROBLEMS  UNUSUAL RASH Items with * indicate a potential emergency and should be followed up as soon as possible.  Feel free to call the clinic you have any questions or concerns. The clinic phone number is [phone].    

## 2013-06-23 ENCOUNTER — Ambulatory Visit (HOSPITAL_BASED_OUTPATIENT_CLINIC_OR_DEPARTMENT_OTHER): Payer: Medicare Other

## 2013-06-23 ENCOUNTER — Telehealth: Payer: Self-pay | Admitting: Family Medicine

## 2013-06-23 VITALS — BP 129/80 | HR 79 | Temp 98.1°F

## 2013-06-23 DIAGNOSIS — Z5189 Encounter for other specified aftercare: Secondary | ICD-10-CM

## 2013-06-23 DIAGNOSIS — C8590 Non-Hodgkin lymphoma, unspecified, unspecified site: Secondary | ICD-10-CM

## 2013-06-23 DIAGNOSIS — C8589 Other specified types of non-Hodgkin lymphoma, extranodal and solid organ sites: Secondary | ICD-10-CM | POA: Diagnosis not present

## 2013-06-23 MED ORDER — PEGFILGRASTIM INJECTION 6 MG/0.6ML
6.0000 mg | Freq: Once | SUBCUTANEOUS | Status: AC
  Administered 2013-06-23: 6 mg via SUBCUTANEOUS
  Filled 2013-06-23: qty 0.6

## 2013-06-23 NOTE — Telephone Encounter (Signed)
Pt was put on lasix and swelling is much better however now her potassium is too high. Dr. Rosie Fate, her oncologist, states he would like her to stop taking lasix. Before pt did stop they would like to run it by you. Please call pt's husband and inform if you think pt should stop Lasix.

## 2013-06-23 NOTE — Progress Notes (Signed)
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 Lymphoma    CURRENT THERAPY: R miniCHOP x  1, given on 09/20; R-Mini CHO cycle #2 given on 06/01/13 followed by neulasta 6 mg SQ on Day #2.  Scheduled for R-Mini CHO cycle #3 today.  She had psychosis, memory lost, hyperglycemia secondary to prednisone following her first cycle. Planning to complete 6 to 8 cycles.   INTERVAL HISTORY: Tonya Wise 72 y.o. 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   She was last seen by me on 05/27/2013.   She had an interim PET/CT following 2 cycles which showed a significant response to therapy (as outlined below).  In addition, it showed a pulmonary embolus.   She was started on lovenox and coumadin bridge.  She was seen by cardiology secondary to tachycardia and was found to have a significantly elevated pro-BNP.  She was started on low dose ace inhibitor and additional studies including nuclear stress per the [name] is planned.  She denies chest pain presently.  She is on lasix daily with resolution in her lower extremity edema.  She continues to complain of weakness but feels her strength has improved.  Today, she is accompanied by her husband [name].  She states she feels very tired following her chemotherapy for 3-4 days but then recovers.  She reports some hair lost and taste changes.  She  states that she cannot eat "sweets" anymore because of the taste.  She denies fevers or chills or night sweats.  She reports she is excited about thanksgiving holiday and spending time with family.   MEDICAL HISTORY: Past Medical History  Diagnosis Date  . Hypertension   . Renal insufficiency   . Osteopenia   . Allergy   . Heart murmur   . GERD (gastroesophageal reflux disease)   . Anemia   . Hypercalcemia 03/27/2013  . Non-Hodgkins lymphoma 04/06/2013  . 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    ALLERGIES:  is allergic to penicillins.  MEDICATIONS: has a current medication list which includes the following prescription(s): albuterol, allopurinol, aspirin, bisoprolol, chlorpheniramine-hydrocodone, dextromethorphan, feeding supplement (ensure complete), furosemide, lisinopril, nystatin, omeprazole, ondansetron, oxycodone, warfarin, fluconazole, lidocaine-prilocaine, potassium chloride, and valacyclovir.  SURGICAL HISTORY:  Past Surgical History  Procedure Laterality Date  . Appendectomy    . Colonoscopy  2008  . Spine surgery    . Back surgery      LOWER BACK TWICE  . Abdominal hysterectomy    . Esophagogastroduodenoscopy N/A 03/29/2013  Procedure: ESOPHAGOGASTRODUODENOSCOPY (EGD);   Surgeon: Charna Elizabeth, MD;  Location: [facility] ENDOSCOPY;  Service: Endoscopy;  Laterality: N/A;  . Inguinal lymph node biopsy Right 03/31/2013    Procedure: INGUINAL LYMPH NODE BIOPSY;  Surgeon: Cherylynn Ridges, MD;  Location: [facility] OR;  Service: General;  Laterality: Right;   ONCOLOGY HISTORY: She had a prior hospitalization from September 12 from September of 26 2014 secondary to hypercalcemia, renal failure and dehydration. During this period, the patient's clinical course was complicated by pneumonia and she was treated with intravenous antibiotics and the development of lethargy and confusion which resolved over the clinical course. She was readmitted from home on 04/25/2013 for worsening mouth sores/oral thrush, odynophagia, failure to thrive. Patient was started on high-dose acyclovir for empiric herpetic encephalitis and/or HSV esophagitis.  She was discharged to [facility] on 10/01 and continue physical rehabilitation and medical provided by Dr. Angelina Ok.  She was discharged from the [facility] on 10/21 and now is at home.  She reports increased appetite and ability to tolerate po.  She is much more communicative and today is accompanied by her daughter and husband [name].   She is concerned about the puffy feet.  She has been on lasix 20 mg daily with moderate improvement. She denies any further fevers or night sweats.  She denies any recent blood transfusions.   On initial evaluation, she was sent by her PCP to [facility] Emergency department because of anemia (Hgb 6.4) on August 29th, 2014. She reported that she started to lost her appetite and started to lose weight about 2 months ago. She became increasingly weak and fatigued. In addition, she also developed cough with green sputum and dyspnea. Patient also noticed new headaches, diminished hearing. She also complained fevers (persistent) and nightsweats that at times drenched her bedsheets. She reported nasal congestion and white nasal discharge, feeling  some lump in throat. On admission, she also complained of abdominal pain, episodes of nausea, low back pain. Patient reported history of colonoscopy and EGD several years ago. She received 3 units of RBC. GI consult was done. Patient was admitted and transfused a total of 3 units of PRBC. GI was consulted and an EGD done on 8/31 did not show any foci for bleeding. CT scan of the abdomen and chest, showed significant lymphadenopathy, suspicious of lymphoma. Hematology/oncology was also consulted and recommended core needle biopsy. Surgery performed a biopsy of R inguinal lymph node revealed Diffuse large B cell lymphoma.    REVIEW OF SYSTEMS:   Constitutional: Denies fevers, chills or abnormal weight loss Eyes: Denies blurriness of vision Ears, nose, mouth, throat, and face: + mild mucositis but overall improvement with some discoloration on the surface of her tongue but she denies  sore throat Respiratory: Denies cough, dyspnea or wheezes Cardiovascular: Denies palpitation, chest discomfort; positive for lower extremity swelling Gastrointestinal:  Denies nausea, heartburn or change in bowel habits Skin: Denies abnormal skin rashes Lymphatics: Denies new lymphadenopathy or easy bruising Neurological:Denies numbness, tingling or new weaknesses Behavioral/Psych: Mood is stable, no new changes  All other systems were reviewed with the patient and are negative.  PHYSICAL EXAMINATION: ECOG PERFORMANCE STATUS: 1 - Symptomatic but completely ambulatory  Blood pressure 115/72, pulse 81, temperature 97.7 F (36.5 C), temperature source Oral, resp. rate 18, height 5\' 2"  (1.575 m), weight 137 lb 8 oz (62.37 kg).  GENERAL:alert, no distress and comfortable; ambulates by cane but required assistance to the table.  SKIN: skin color, texture, turgor are normal, no  rashes or significant lesions; + R sided Port a cath EYES: normal, Conjunctiva are pink and non-injected, sclera clear  OROPHARYNX:no exudate, no  erythema and lips, buccal mucosa, and tongue with a grayish hue but without bleeding and minimal swelling (improved since last visit).  No lip lesions identified. OP clear.  NECK: supple, thyroid normal size, non-tender, without nodularity LYMPH:  no palpable lymphadenopathy in the cervical, axillary or supraclavicular; could not palpate axillary lymph on the right.  LUNGS: clear to auscultation and percussion with normal breathing effort HEART: regular rate & rhythm and no murmurs and no lower extremity edema on right, trace edema on the left ABDOMEN:abdomen soft, non-tender and normal bowel sounds Musculoskeletal:no cyanosis of digits and no clubbing  NEURO: alert & oriented x 3 with fluent speech, no focal motor/sensory deficits  LABORATORY DATA: Results for orders placed in visit on 06/22/13 (from the past 48 hour(s))  CBC WITH DIFFERENTIAL     Status: Abnormal   Collection Time    06/22/13  9:55 AM      Result Value Range   WBC 4.9  3.9 - 10.3 10e3/uL   NEUT# 3.1  1.5 - 6.5 10e3/uL   HGB 9.9 (*) 11.6 - 15.9 g/dL   HCT 81.1 (*) 91.4 - 78.2 %   Platelets 153  145 - 400 10e3/uL   MCV 94.6  79.5 - 101.0 fL   MCH 31.3  25.1 - 34.0 pg   MCHC 33.1  31.5 - 36.0 g/dL   RBC 9.56 (*) 2.13 - 0.86 10e6/uL   RDW 19.9 (*) 11.2 - 14.5 %   lymph# 1.2  0.9 - 3.3 10e3/uL   MONO# 0.5  0.1 - 0.9 10e3/uL   Eosinophils Absolute 0.0  0.0 - 0.5 10e3/uL   Basophils Absolute 0.1  0.0 - 0.1 10e3/uL   NEUT% 62.2  38.4 - 76.8 %   LYMPH% 24.9  14.0 - 49.7 %   MONO% 10.5  0.0 - 14.0 %   EOS% 0.6  0.0 - 7.0 %   BASO% 1.8  0.0 - 2.0 %   nRBC 0  0 - 0 %  COMPREHENSIVE METABOLIC PANEL (CC13)     Status: Abnormal   Collection Time    06/22/13  9:56 AM      Result Value Range   Sodium 140  136 - 145 mEq/L   Potassium 3.3 (*) 3.5 - 5.1 mEq/L   Chloride 108  98 - 109 mEq/L   CO2 23  22 - 29 mEq/L   Glucose 95  70 - 140 mg/dl   BUN 6.6 (*) 7.0 - 57.8 mg/dL   Creatinine 0.9  0.6 - 1.1 mg/dL   Total  Bilirubin 4.69  0.20 - 1.20 mg/dL   Alkaline Phosphatase 75  40 - 150 U/L   AST 18  5 - 34 U/L   ALT 11  0 - 55 U/L   Total Protein 4.8 (*) 6.4 - 8.3 g/dL   Albumin 2.9 (*) 3.5 - 5.0 g/dL   Calcium 8.8  8.4 - 62.9 mg/dL   Anion Gap 9  3 - 11 mEq/L  PROTIME-INR     Status: Abnormal   Collection Time    06/22/13 10:02 AM      Result Value Range   Protime 55.2 (*) 10.6 - 13.4 Seconds   INR 4.60 (*) 2.00 - 3.50   Comment: INR is useful only to assess adequacy of anticoagulation with coumadin when comparing results from different labs. It  should not be used to estimate bleeding risk or presence/abscense of coagulopathy in patients not on coumadin. Expected INR ranges for      nontherapeutic patients is 0.88 - 1.12.   Lovenox Yes      Labs:  Lab Results  Component Value Date   WBC 4.9 06/22/2013   HGB 9.9* 06/22/2013   HCT 29.9* 06/22/2013   MCV 94.6 06/22/2013   PLT 153 06/22/2013   NEUTROABS 3.1 06/22/2013      Chemistry      Component Value Date/Time   NA 140 06/22/2013 0956   NA 137 04/29/2013 0500   K 3.3* 06/22/2013 0956   K 3.5 04/29/2013 0500   CL 103 04/29/2013 0500   CO2 23 06/22/2013 0956   CO2 25 04/29/2013 0500   BUN 6.6* 06/22/2013 0956   BUN 16 04/29/2013 0500   CREATININE 0.9 06/22/2013 0956   CREATININE 0.96 04/29/2013 0500   CREATININE 1.37* 03/26/2013 1338      Component Value Date/Time   CALCIUM 8.8 06/22/2013 0956   CALCIUM 7.0* 04/29/2013 0500   ALKPHOS 75 06/22/2013 0956   ALKPHOS 154* 04/25/2013 1410   AST 18 06/22/2013 0956   AST 35 04/25/2013 1410   ALT 11 06/22/2013 0956   ALT 16 04/25/2013 1410   BILITOT 0.30 06/22/2013 0956   BILITOT 1.0 04/25/2013 1410     Basic Metabolic Panel:  Recent Labs Lab 06/22/13 0956  NA 140  K 3.3*  CO2 23  GLUCOSE 95  BUN 6.6*  CREATININE 0.9  CALCIUM 8.8   GFR Estimated Creatinine Clearance: 49.1 ml/min (by C-G formula based on Cr of 0.9). Liver Function Tests:  Recent Labs Lab 06/22/13 0956  AST 18   ALT 11  ALKPHOS 75  BILITOT 0.30  PROT 4.8*  ALBUMIN 2.9*   CBC:  Recent Labs Lab 06/22/13 0955  WBC 4.9  NEUTROABS 3.1  HGB 9.9*  HCT 29.9*  MCV 94.6  PLT 153   Results for Tonya, Wise (MRN [id number]) as of 04/17/2013 16:25   Ref. Range  04/16/2013 18:12   Hepatitis B Surface Ag  Latest Range: NEGATIVE  NEGATIVE   Hep B S Ab  Latest Range: NEGATIVE  NEGATIVE   Hep B Core Total Ab  Latest Range: NEGATIVE  NEGATIVE     Studies:  Bone Marrow c/w with DLBCL per preliminary report. Await final report.  Pathology (03/31/2013):  Lymph node for lymphoma, Right, Inguinal  - DIFFUSE LARGE B CELL LYMPHOMA  - SEE ONCOLOGY TABLE.  Microscopic Comment  LYMPHOMA  Histologic type: Non-Hodgkin's lymphoma, diffuse large cell type. Grade (if applicable): High grade.  Flow cytometry: A minor monoclonal B cell population with lambda light chain restriction. ([id number]).  Immunohistochemical stains: BCL-2, BCL-6, CD10, CD138, CD20, CD3, CD30, LCA, CD43, and CD79a with appropriate controls.  Touch preps/imprints: Abundance of large lymphoid cells with prominent nucleoli admixed with small lymphoid cells.  Comments: The sections show effacement of the lymph nodal architecture by a diffuse relatively monomorphicpopulation of large lymphoid cells with vesicular chromatin, prominent nucleoli and eosinophilic to amphophilic cytoplasm. This is associated with brisk mitoses and areas of tumor necrosis. The appearance is mostly diffuse with lack of follicular of nodular structures. Flow cytometric analysis was performed ([id number]) and shows a minor population of monoclonal B cells displaying pan B cell antigens including CD20.  Immunohistochemical stains were performed and show that the large atypical lymphoid cells are positive for LCA, CD79a, CD20, BCL-2 and BCL-6. There is  partial positivity for CD30. No appreciable positivity is seen with CD10 or CD138. CD3 and CD43 highlight the admixed T cell  component present in the background. The overall histologic and immunophenotypic features are consistent with diffuse large B cell lymphoma.  Specimen Gross and Clinical Information  Specimen(s) Obtained:  Lymph node for lymphoma, Right, Inguinal  1 of 2  FINAL for Tonya, Wise ([id number])  Specimen Clinical Information  Right Inguinal Adenopathy (jmc)  Gross  Received fresh is a 3.4 x 2.8 x 2.5 cm encapsulated, rubbery, ovoid nodule. There is a separate, focally  disrupted 2.5 x 2.2 x 1.2 cm nodular portion of tissue. The cut surfaces are solid, tan-white with focal slight  hemorrhage. A portion of the specimen is placed in RPMI for flow cytometry and touch preparations are made  from the cut surfaces. Sections are submitted in three cassettes. (GRP:ecj 03/31/2013)  Stain(s) used in Diagnosis:  The following stain(s) were used in diagnosing the case: BCl 6 , CD 79a, CD 30, BCl 2, CD 138, CD 3, CD 43,  CD45 (LCA), CD-10, CD 20. The control(s) stained appropriately.   RADIOGRAPHIC STUDIES: 06/16/2013 NUCLEAR MEDICINE PET SKULL BASE TO THIGH FASTING BLOOD GLUCOSE: Value: 105mg /dl TECHNIQUE: 40.9 mCi W-11 FDG was injected intravenously. CT data was obtained  and used for attenuation correction and anatomic localization only. (This was not acquired as a diagnostic CT examination.) Additional exam technical data entered on technologist worksheet.  COMPARISON: CT scan 03/30/2013. FINDINGS: NECK No hypermetabolic lymph nodes in the neck.  CHEST There is a single right axillary lymph node measuring 16 x 10 mm in demonstrates FDG uptake with SUV max of 5.5. This would suggest some residual metabolically active lymphoma. The mediastinal and hilar lymph nodes are much smaller than a previous chest CT and I do not see any definite metabolic activity. No worrisome pulmonary lesions. The right middle lobe subpleural density is relatively stable and is likely scarring or atelectasis. No abnormal FDG uptake  is identified. There is a small right pleural effusion.  ABDOMEN/PELVIS No persistence/residual lymphadenopathy in the abdomen. A few small scattered lymph nodes are noted but the bulky adenopathy has resolved. No metabolic activity is identified in the remaining small lymph nodes. Stable mild splenomegaly with numerous small lesions but no hyper metabolism. SKELETON Diffuse osseous uptake is likely due to rebound marrow activity or marrow stimulating drugs. IMPRESSION: 1. Single metabolically active 16 x 10 mm in the right axilla. 2. Persistent but definitely smaller mediastinal and hilar lymph node without definite metabolic activity. 3. Resolution of abdominal adenopathy with some scattered residual  lymph nodes but no metabolic activity. 4. Persistent mild splenomegaly and small splenic lesions without obvious hyper metabolism.   06/16/2013. CT CHEST, ABDOMEN, AND PELVIS WITH CONTRAST TECHNIQUE: Multidetector CT imaging of the chest, abdomen and pelvis was performed following the standard protocol during bolus  administration of intravenous contrast. CONTRAST: OMNIPAQUE IOHEXOL 300 MG/ML SOLN  COMPARISON: 03/30/2013. FINDINGS: CT CHEST FINDINGS The chest wall demonstrates a right-sided Port-A-Cath in good position without complicating features. Bilateral breast prosthesis  are noted. The bulky bilateral axillary lymphadenopathy has resolved. There is a small residual right axillary node on image number 12 which measures 16 x 12 mm. It previously measured 27 x 20  mm. It did show slight hypermetabolism on the PET scan. Other normal size axillary lymph nodes did not show any hyper metabolism. The bony thorax is intact. No destructive bone lesions or spinal  canal compromise. The heart  is normal in size. No pericardial effusion. No mediastinal or hilar mass. The bulky lymphadenopathy seen on the prior study has significantly decreased in size. The subcarinal nodal mass previously measured 4.0 x 2.2 cm  and now measures 2.0 x 1.3 cm. The aorta is normal in caliber. Stable atherosclerotic calcifications. There is clot noted in the left lower lobe pulmonary artery  consistent with pulmonary embolism. There is also a small amount of string like adherent clot in the main left pulmonary artery. Examination of the lung parenchyma demonstrates no worrisome  pulmonary nodules. Stable area of subpleural scarring type changes in the right middle lobe. There is a small right pleural effusion with overlying atelectasis. CT ABDOMEN AND PELVIS FINDINGS The liver demonstrates diffuse fatty infiltration but no focal hepatic lesions or biliary dilatation. The gallbladder is normal. No common bowel duct dilatation. The pancreas is normal. The spleen remains mildly enlarged and there are multiple low-attenuation lesions. The adrenal glands and kidneys are unremarkable and  unchanged. The stomach, duodenum, small bowel and colon are unremarkable. No inflammatory changes, mass lesions or obstructive findings. There are small scattered mesenteric and retroperitoneal lymph nodes. This reflects a significant improvement since the prior CT scan where  there was bulky adenopathy. The aorta and branch vessels are patent. Stable atherosclerotic calcifications. The uterus and ovaries are unremarkable and stable. The bladder is normal. No pelvic mass, adenopathy or free pelvic fluid collections. No inguinal mass or adenopathy. The borderline inguinal lymph nodes have resolved. No significant bony findings. Stable lumbar fusion changes.  IMPRESSION: 1. Significant interval reduction in axillary lymphadenopathy. There is 1 small residual right axillary lymph node measuring 16 x 12 mm which did show FDG uptake. 2. Significant reduction in mediastinal and hilar lymphadenopathy. 3. Left-sided pulmonary embolism. 4. Resolution of bulky abdominal lymphadenopathy. 5. Interval decrease in size of the spleen (15 x 14 x 10.5 versus 12  x 12 x 9.5).  Persistent small lesions.   ASSESSMENT: RAEL TILLY 72 y.o. 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 DLBCL, high grade. stage IV with bone marrow involvement and B symptoms (fevers and nightsweats).  --Her International Prognostic index is 4 (NEJM, 1993) with a 3-year survival rate of 59% based on an elevated LDH, age greater than 14 years old, performance status of 2, Stage IV. She has extranodal involvement in the spleen and in the bone marrow.  --On prior visits, we had a detailed discussion the patient and her family starting R-miniCHOP (Peyrade F, [employer] Oncology) chemotherapy including the benefits of possibly controlling her disease and the side-effects of therapy including risks of bone marrow toxicity including but not limited to myelosuppression which can lead to the development of life-threatening infections, nausea/vomiting, diarrhea, ananaphylaxis, neuropathy, cystitis, flu-like symptoms. She understood these risk and agreed to proceed with chemotherapy. We will start R-mini CHO (excluding the prednisone at her request due to altered mental status changes while on prednisone and worsening hypergycemia) q 21 days for 6-8 cycles consisting of rituximab, cyclophosphamide, doxorubicin, vincristine. In discussion with pharmacy, we choose R-miniCHO based on her poor nutritional reserve and prolonged recovery during her 1st cycle. We discussed the risks associated with smaller dose includes less effective treatment  potentially but may allow her more recovery.  We will reassess her physical state and discuss increasing her  R-CHO based on continued recovery.  Given her recent PE, questionable tachycardia related to her diastolic dysfunction.  She is having this further worked up.  Today,her treatment regiment is as follows:       R-miniCHO (Cycle #3, Day 1 on 06/22/2013)          Day #1      Doxorubicin  25 mg/m(2)       Vincristine 1 mg       Cyclophosphamide 400 mg/m(2)       Rituximab 375 mg/m(2)      (Prednisone WILL BE EXCLUDED due to above).           Day #2     She will be given neulasta 6 mg SQ x 1 on day #2 (06/23/2013).   She will continue prophylaxis with allopurinol  300 mg daily. She will start anti-emetics with zofran 8 mg two times daily starting the day after chemo for 3 days then two times a day prn for nausea or vomiting.   -- Her restaging scans on (06/16/2013) demonstrated significant response as noted above. PET showed a single metabolically active 16 x 10 mm in the right axilla; persistent but definitely smaller mediastinal and hilar lymph node without definite metabolic activity; resolution of abdominal adenopathy with some scattered residual lymph nodes but no metabolic activity; persistent mild splenomegaly and small splenic lesions without obvious hyper metabolism.   2. 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 -- Her counts are continuing to recover.  She is no longer neutropenic with an ANC of 3.1 and her plts are 153,000.  We will transfuse pRBCs based on symptoms and to maintain a hemoglobin greater than 8.   She denies any recent infections.    3. 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  She is coumdain 5 mg daily.  Her INR is supratherapeutic today at 4.6.   She is being followed by anti-coagulation clinic. Coumadin education was reviewed including indication, INR testing, S/S of bleeding, Rx and OTC drug interaction, herbal medications, Vit K containing foods and EtOH intake. Iwill give Tonya Wise  fluconazole x 1 today which can further increase INR so she was instructed to  hold coumadin today and tomorrow. Resume Coumadin at 4mg  daily and check PT/INR on Friday.     4. 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  Given continuation of her symptoms, we will give one dose of diflucan today.  She is aware of the coumadin and diflucan interaction.   She remains on valtrex 500 mg daily for viral prophylaxis.     5. Poor nutritional, hypokalemia.  --Continue to supplement diet with Ensure complete 3 times daily with meals.  --Provided potassium chloride 10 meQ bid for three days.  She is on lasix also so she may require long-term supplementation with lasix.   6. Deconditioning secondary to chronic illness and prior hospitalization. --Continue physical therapy as tolerated.  Patient requested an extension for additional core strengthening exercises.   7. Follow-up.   --Patient will follow-up for an office visit with CBC, CMP, LDH in consideration for Cycle #4 of R-miniCHO on 07/13/2014.  Labs in one week.    All questions were answered. The patient knows to call the clinic with any problems, questions or concerns. We can certainly  see the patient much sooner if necessary.   I spent 25 minutes counseling the patient face to face. The total time spent in the appointment was 40 minutes.    Bailee Thall, MD 06/23/2013 9:11 AM

## 2013-06-23 NOTE — Telephone Encounter (Signed)
Apparently her potassium is low and her swelling has diminished. I am okay with holding the Lasix.

## 2013-06-24 ENCOUNTER — Other Ambulatory Visit: Payer: Self-pay | Admitting: Internal Medicine

## 2013-06-24 DIAGNOSIS — J209 Acute bronchitis, unspecified: Secondary | ICD-10-CM

## 2013-06-24 MED ORDER — AZITHROMYCIN 250 MG PO TABS: 250.0000 mg | ORAL_TABLET | Freq: Every day | ORAL | Status: DC

## 2013-06-24 MED ORDER — AZITHROMYCIN 250 MG PO TABS: 500.0000 mg | ORAL_TABLET | Freq: Every day | ORAL | Status: DC

## 2013-06-24 NOTE — Progress Notes (Signed)
Patient reports increased coughing and sputum production x 2 days.  We will treat for acute on chronic bronchitis.  We will follow up in 2 days.  Patient agreeable.

## 2013-06-26 ENCOUNTER — Other Ambulatory Visit (HOSPITAL_BASED_OUTPATIENT_CLINIC_OR_DEPARTMENT_OTHER): Payer: Medicare Other | Admitting: Lab

## 2013-06-26 ENCOUNTER — Other Ambulatory Visit: Payer: Self-pay | Admitting: Internal Medicine

## 2013-06-26 ENCOUNTER — Ambulatory Visit (HOSPITAL_BASED_OUTPATIENT_CLINIC_OR_DEPARTMENT_OTHER): Payer: Medicare Other | Admitting: Pharmacist

## 2013-06-26 DIAGNOSIS — C8590 Non-Hodgkin lymphoma, unspecified, unspecified site: Secondary | ICD-10-CM

## 2013-06-26 DIAGNOSIS — I2699 Other pulmonary embolism without acute cor pulmonale: Secondary | ICD-10-CM

## 2013-06-26 DIAGNOSIS — C8589 Other specified types of non-Hodgkin lymphoma, extranodal and solid organ sites: Secondary | ICD-10-CM | POA: Diagnosis not present

## 2013-06-26 DIAGNOSIS — J40 Bronchitis, not specified as acute or chronic: Secondary | ICD-10-CM

## 2013-06-26 LAB — PROTIME-INR
INR: 4.9 — ABNORMAL HIGH (ref 2.00–3.50)
Protime: 58.8 Seconds — ABNORMAL HIGH (ref 10.6–13.4)

## 2013-06-26 LAB — CBC WITH DIFFERENTIAL/PLATELET
BASO%: 0.2 % (ref 0.0–2.0)
EOS%: 0.1 % (ref 0.0–7.0)
Eosinophils Absolute: 0.1 10*3/uL (ref 0.0–0.5)
MCH: 31.3 pg (ref 25.1–34.0)
MCHC: 32.1 g/dL (ref 31.5–36.0)
MCV: 97.4 fL (ref 79.5–101.0)
MONO%: 1.3 % (ref 0.0–14.0)
NEUT#: 34.4 10*3/uL — ABNORMAL HIGH (ref 1.5–6.5)
Platelets: 100 10*3/uL — ABNORMAL LOW (ref 145–400)
RBC: 3.07 10*6/uL — ABNORMAL LOW (ref 3.70–5.45)
RDW: 20.2 % — ABNORMAL HIGH (ref 11.2–14.5)
lymph#: 0.7 10*3/uL — ABNORMAL LOW (ref 0.9–3.3)

## 2013-06-26 LAB — MAGNESIUM (CC13): Magnesium: 1.5 mg/dl (ref 1.5–2.5)

## 2013-06-26 LAB — POCT INR: INR: 4.9

## 2013-06-26 LAB — LACTATE DEHYDROGENASE (CC13): LDH: 355 U/L — ABNORMAL HIGH (ref 125–245)

## 2013-06-26 MED ORDER — AZITHROMYCIN 250 MG PO TABS: ORAL_TABLET | ORAL | Status: DC

## 2013-06-26 NOTE — Progress Notes (Signed)
Patient's INR is still supratherapeutic today at 4.9.  She held two doses of her Coumadin (Monday 11/24 and Tuesday 11/25) and took a reduced dose of 4mg  on Wednesday and Thursday.  She also had a single dose of fluconazole on Monday.  She reports no bleeding.  I instructed the patient to hold her Coumadin today and over the weekend.  I told the patient she could have a small serving of greens if she liked, since her INR is still elevated.  We will recheck her INR on Monday 06/29/13; labs at noon and Coumadin clinic at 12:15.  Lillia Pauls, PharmD, BCPS Clinical Pharmacist Pager: [phone] 06/26/2013 11:06 AM

## 2013-06-26 NOTE — Patient Instructions (Signed)
Do not take Coumadin today, tomorrow or Sunday.  Will check PT/INR on Monday; Labs at 12 pm and coumadin clinic at 12:15.

## 2013-06-29 ENCOUNTER — Other Ambulatory Visit (HOSPITAL_BASED_OUTPATIENT_CLINIC_OR_DEPARTMENT_OTHER): Payer: Medicare Other

## 2013-06-29 ENCOUNTER — Ambulatory Visit (HOSPITAL_BASED_OUTPATIENT_CLINIC_OR_DEPARTMENT_OTHER): Payer: Medicare Other | Admitting: Pharmacist

## 2013-06-29 ENCOUNTER — Ambulatory Visit: Payer: Medicare Other | Admitting: Cardiovascular Disease

## 2013-06-29 ENCOUNTER — Telehealth: Payer: Self-pay | Admitting: Internal Medicine

## 2013-06-29 ENCOUNTER — Ambulatory Visit (HOSPITAL_BASED_OUTPATIENT_CLINIC_OR_DEPARTMENT_OTHER): Payer: Medicare Other

## 2013-06-29 DIAGNOSIS — C8590 Non-Hodgkin lymphoma, unspecified, unspecified site: Secondary | ICD-10-CM

## 2013-06-29 DIAGNOSIS — C8589 Other specified types of non-Hodgkin lymphoma, extranodal and solid organ sites: Secondary | ICD-10-CM | POA: Diagnosis not present

## 2013-06-29 DIAGNOSIS — I2699 Other pulmonary embolism without acute cor pulmonale: Secondary | ICD-10-CM

## 2013-06-29 LAB — CBC WITH DIFFERENTIAL/PLATELET
BASO%: 0.8 % (ref 0.0–2.0)
Basophils Absolute: 0.1 10e3/uL (ref 0.0–0.1)
EOS%: 1.3 % (ref 0.0–7.0)
Eosinophils Absolute: 0.1 10e3/uL (ref 0.0–0.5)
HCT: 28.1 % — ABNORMAL LOW (ref 34.8–46.6)
HGB: 9.2 g/dL — ABNORMAL LOW (ref 11.6–15.9)
LYMPH%: 8.7 % — ABNORMAL LOW (ref 14.0–49.7)
MCH: 31.3 pg (ref 25.1–34.0)
MCHC: 32.7 g/dL (ref 31.5–36.0)
MCV: 95.6 fL (ref 79.5–101.0)
MONO#: 0.9 10e3/uL (ref 0.1–0.9)
MONO%: 8.5 % (ref 0.0–14.0)
NEUT#: 8.2 10e3/uL — ABNORMAL HIGH (ref 1.5–6.5)
NEUT%: 80.7 % — ABNORMAL HIGH (ref 38.4–76.8)
Platelets: 89 10e3/uL — ABNORMAL LOW (ref 145–400)
RBC: 2.94 10e6/uL — ABNORMAL LOW (ref 3.70–5.45)
RDW: 19.6 % — ABNORMAL HIGH (ref 11.2–14.5)
WBC: 10.2 10e3/uL (ref 3.9–10.3)
lymph#: 0.9 10e3/uL (ref 0.9–3.3)
nRBC: 0 % (ref 0–0)

## 2013-06-29 LAB — PROTIME-INR

## 2013-06-29 MED ORDER — ENOXAPARIN SODIUM 100 MG/ML ~~LOC~~ SOLN
100.0000 mg | Freq: Once | SUBCUTANEOUS | Status: AC
  Administered 2013-06-29: 100 mg via SUBCUTANEOUS
  Filled 2013-06-29: qty 1

## 2013-06-29 NOTE — Progress Notes (Signed)
INR = 1.7   Goal 2-3 Patient is subtherapeutic today following holding Coumadin x 3 days. INR on 11/28 was 4.9 following a single dose of fluconazole 200mg .  Patient was started on a Z-Pak 11/28 for URTI, she will finish on Wednesday 12/3. Spoke with Dr. Rosie Fate, he would like to bridge the patient with Lovenox 100 mg x 3 days, repeat INR on Thursday 12/4. Patient's husband inquired about whether to continue to hold ASA 81mg .  I told him to hold while patient was on Coumadin. Patient will begin Lovenox 100mg  today, Liborio Nixon will give 1st dose injection room. Coumadin 2.5 mg daily will be started today. Patient will return 07/02/13 for lab at 10:00 and Coumadin Clinic at 10:15. She is anxious to get off Lovenox.  Lovenox 100 mg x 1 syringe given for patient to take home, she still has 1 syringes from her previous Lovenox dosing.  Lot: [id number]  Exp: 12/15  Cletis Athens, PharmD

## 2013-06-29 NOTE — Telephone Encounter (Signed)
Called to inform patient of labs.  INR 1.7, will give lovenox x 2 days and restart coumadin.

## 2013-06-30 ENCOUNTER — Other Ambulatory Visit: Payer: Self-pay | Admitting: Internal Medicine

## 2013-06-30 DIAGNOSIS — C8590 Non-Hodgkin lymphoma, unspecified, unspecified site: Secondary | ICD-10-CM

## 2013-07-01 MED ORDER — NYSTATIN 100000 UNIT/ML MT SUSP: 5.0000 mL | Freq: Four times a day (QID) | OROMUCOSAL | Status: DC | PRN

## 2013-07-01 NOTE — Telephone Encounter (Signed)
Pt states her tongue is still grey and sore. States it is getting better, but slowly.

## 2013-07-02 ENCOUNTER — Ambulatory Visit (HOSPITAL_BASED_OUTPATIENT_CLINIC_OR_DEPARTMENT_OTHER): Payer: Medicare Other | Admitting: Pharmacist

## 2013-07-02 ENCOUNTER — Other Ambulatory Visit (HOSPITAL_BASED_OUTPATIENT_CLINIC_OR_DEPARTMENT_OTHER): Payer: Medicare Other | Admitting: Lab

## 2013-07-02 ENCOUNTER — Encounter: Payer: Self-pay | Admitting: *Deleted

## 2013-07-02 DIAGNOSIS — I129 Hypertensive chronic kidney disease with stage 1 through stage 4 chronic kidney disease, or unspecified chronic kidney disease: Secondary | ICD-10-CM | POA: Diagnosis not present

## 2013-07-02 DIAGNOSIS — C8589 Other specified types of non-Hodgkin lymphoma, extranodal and solid organ sites: Secondary | ICD-10-CM | POA: Diagnosis not present

## 2013-07-02 DIAGNOSIS — I2699 Other pulmonary embolism without acute cor pulmonale: Secondary | ICD-10-CM

## 2013-07-02 DIAGNOSIS — I5030 Unspecified diastolic (congestive) heart failure: Secondary | ICD-10-CM | POA: Diagnosis not present

## 2013-07-02 DIAGNOSIS — IMO0001 Reserved for inherently not codable concepts without codable children: Secondary | ICD-10-CM | POA: Diagnosis not present

## 2013-07-02 DIAGNOSIS — D649 Anemia, unspecified: Secondary | ICD-10-CM | POA: Diagnosis not present

## 2013-07-02 DIAGNOSIS — C8590 Non-Hodgkin lymphoma, unspecified, unspecified site: Secondary | ICD-10-CM

## 2013-07-02 LAB — PROTIME-INR: INR: 2.6 (ref 2.00–3.50)

## 2013-07-02 NOTE — Patient Instructions (Signed)
No more lovenox shots.  Take Coumadin 2.5 daily. Will check PT/INR on 07/13/13 labs at 8:00, 8:30 MD and 9:30 infusion.  We will see you during your infusion.

## 2013-07-02 NOTE — Progress Notes (Signed)
Pt seen in clinic today with husband INR=2.6 on 2.5 mg daily and after 3 lovenox shots She will stop lovenox and continue 2.5 mg daily We will see her during her infusion on 12/15 around 9:45 Pt and husband both state she does have some bruising from shots. Explained that was normal and should resolve on its own. She also has 4 mg tablets at home. Verified she is taking half of the 5mg  tablets. No other s/s of bleeding.

## 2013-07-06 ENCOUNTER — Encounter: Payer: Self-pay | Admitting: *Deleted

## 2013-07-07 ENCOUNTER — Ambulatory Visit (HOSPITAL_COMMUNITY)
Admission: RE | Admit: 2013-07-07 | Discharge: 2013-07-07 | Disposition: A | Discharge status: Home / Self Care | Payer: Medicare Other | Source: Ambulatory Visit | Attending: Cardiovascular Disease | Admitting: Cardiovascular Disease

## 2013-07-07 DIAGNOSIS — Z87891 Personal history of nicotine dependence: Secondary | ICD-10-CM | POA: Diagnosis not present

## 2013-07-07 DIAGNOSIS — Z8673 Personal history of transient ischemic attack (TIA), and cerebral infarction without residual deficits: Secondary | ICD-10-CM | POA: Insufficient documentation

## 2013-07-07 DIAGNOSIS — R079 Chest pain, unspecified: Secondary | ICD-10-CM | POA: Diagnosis not present

## 2013-07-07 DIAGNOSIS — I519 Heart disease, unspecified: Secondary | ICD-10-CM | POA: Insufficient documentation

## 2013-07-07 DIAGNOSIS — Z87898 Personal history of other specified conditions: Secondary | ICD-10-CM | POA: Diagnosis not present

## 2013-07-07 DIAGNOSIS — R9431 Abnormal electrocardiogram [ECG] [EKG]: Secondary | ICD-10-CM

## 2013-07-07 DIAGNOSIS — R42 Dizziness and giddiness: Secondary | ICD-10-CM | POA: Diagnosis not present

## 2013-07-07 DIAGNOSIS — R0989 Other specified symptoms and signs involving the circulatory and respiratory systems: Secondary | ICD-10-CM | POA: Insufficient documentation

## 2013-07-07 DIAGNOSIS — R0609 Other forms of dyspnea: Secondary | ICD-10-CM | POA: Insufficient documentation

## 2013-07-07 DIAGNOSIS — I10 Essential (primary) hypertension: Secondary | ICD-10-CM

## 2013-07-07 MED ORDER — TECHNETIUM TC 99M SESTAMIBI GENERIC - CARDIOLITE
10.0000 | Freq: Once | INTRAVENOUS | Status: AC | PRN
  Administered 2013-07-07: 10 via INTRAVENOUS

## 2013-07-07 MED ORDER — REGADENOSON 0.4 MG/5ML IV SOLN
0.4000 mg | Freq: Once | INTRAVENOUS | Status: AC
  Administered 2013-07-07: 0.4 mg via INTRAVENOUS

## 2013-07-07 MED ORDER — AMINOPHYLLINE 25 MG/ML IV SOLN
75.0000 mg | Freq: Once | INTRAVENOUS | Status: AC
  Administered 2013-07-07: 75 mg via INTRAVENOUS

## 2013-07-07 MED ORDER — TECHNETIUM TC 99M SESTAMIBI GENERIC - CARDIOLITE
30.0000 | Freq: Once | INTRAVENOUS | Status: AC | PRN
  Administered 2013-07-07: 30 via INTRAVENOUS

## 2013-07-07 NOTE — Procedures (Addendum)
[facility] [address] [place] [address] [phone]  Cardiology Nuclear Med Study  [name] is a 72 y.o. female     MRN : [id number]     DOB: 03-27-1941  Procedure Date: 07/07/2013  Nuclear Med Background Indication for Stress Test:  Evaluation for Ischemia and Abnormal EKG History:  Acute bronchitis;Diastolic Dysfunction;Heart Murmur;ARF;Non-Hodgkins Lymphoma Cardiac Risk Factors: History of Smoking, TIA and Pulmonary Embolisim--05/2013  Symptoms:  DOE and Light-Headedness   Nuclear Pre-Procedure Caffeine/Decaff Intake:  9:00pm NPO After: 7:00am   IV Site: R Antecubital  IV 0.9% NS with Angio Cath:  22g  Chest Size (in):  n/a IV Started by: Emmit Pomfret, RN  Height: 5\' 2"  (1.575 m)  Cup Size: D  BMI:  Body mass index is 24.14 kg/(m^2). Weight:  132 lb (59.875 kg)   Tech Comments:  n/a    Nuclear Med Study 1 or 2 day study: 1 day  Stress Test Type:  Lexiscan  Order Authorizing Provider:  Nicki Guadalajara, MD   Resting Radionuclide: Technetium 73m Sestamibi  Resting Radionuclide Dose: 10.3 mCi   Stress Radionuclide:  Technetium 45m Sestamibi  Stress Radionuclide Dose: 31.4 mCi           Stress Protocol Rest HR: 80 Stress HR: 99  Rest BP: 152/77 Stress BP: 142/93  Exercise Time (min): n/a METS: n/a   Predicted Max HR: 148 bpm % Max HR: 77.03 bpm Rate Pressure Product: 21308  Dose of Adenosine (mg):  n/a Dose of Lexiscan: 0.4 mg  Dose of Atropine (mg): n/a Dose of Dobutamine: n/a mcg/kg/min (at max HR)  Stress Test Technologist: Esperanza Sheets, CCT Nuclear Technologist: Koren Shiver, CNMT   Rest Procedure:  Myocardial perfusion imaging was performed at rest 45 minutes following the intravenous administration of Technetium 77m Sestamibi. Stress Procedure:  The patient received IV Lexiscan 0.4 mg over 15-seconds.  Technetium 29m Sestamibi injected at 30-seconds.  The patient experienced SOB and a  headache; 75 mg of IV Aminophylline was administered with resolution of symptoms.  There were no significant changes with Lexiscan.  Quantitative spect images were obtained after a 45 minute delay.  Transient Ischemic Dilatation (Normal <1.22):  1.13 Lung/Heart Ratio (Normal <0.45):  0.24 QGS EDV:  122 ml QGS ESV:  71 ml LV Ejection Fraction: 42%     Rest ECG: NSR-LVH  Stress ECG: No significant change from baseline ECG  QPS Raw Data Images:  There is a breast shadow that accounts for the anterior attenuation. Stress Images:  There is decreased uptake in the anterior wall. Rest Images:  There is decreased uptake in the anterior wall. Subtraction (SDS):  No evidence of ischemia. LV Wall Motion:  Global hypokinesis and mildly-to-moderately depressed overall systolic function. EF 42%.  Impression Exercise Capacity:  Lexiscan with no exercise. BP Response:  Normal blood pressure response. Clinical Symptoms:  No significant symptoms noted. ECG Impression:  No significant ECG changes with Lexiscan. Comparison with Prior Nuclear Study: No previous nuclear study performed   Overall Impression:  Low risk stress nuclear study with mild breast attenuation artifact.   Findings are compatible with nonischemic cardiomyopathy(e.g. chemotherapy, HTN, etc), but multivessel CAD with "balanced ischemia" cannot be fully excluded.Thurmon Fair, MD  07/07/2013 1:17 PM

## 2013-07-10 DIAGNOSIS — I5030 Unspecified diastolic (congestive) heart failure: Secondary | ICD-10-CM | POA: Diagnosis not present

## 2013-07-10 DIAGNOSIS — IMO0001 Reserved for inherently not codable concepts without codable children: Secondary | ICD-10-CM | POA: Diagnosis not present

## 2013-07-10 DIAGNOSIS — C8589 Other specified types of non-Hodgkin lymphoma, extranodal and solid organ sites: Secondary | ICD-10-CM | POA: Diagnosis not present

## 2013-07-10 DIAGNOSIS — I129 Hypertensive chronic kidney disease with stage 1 through stage 4 chronic kidney disease, or unspecified chronic kidney disease: Secondary | ICD-10-CM | POA: Diagnosis not present

## 2013-07-10 DIAGNOSIS — D649 Anemia, unspecified: Secondary | ICD-10-CM | POA: Diagnosis not present

## 2013-07-13 ENCOUNTER — Telehealth: Payer: Self-pay | Admitting: *Deleted

## 2013-07-13 ENCOUNTER — Other Ambulatory Visit (HOSPITAL_BASED_OUTPATIENT_CLINIC_OR_DEPARTMENT_OTHER): Payer: Medicare Other

## 2013-07-13 ENCOUNTER — Ambulatory Visit: Payer: Medicare Other | Admitting: Pharmacist

## 2013-07-13 ENCOUNTER — Encounter: Payer: Self-pay | Admitting: Internal Medicine

## 2013-07-13 ENCOUNTER — Ambulatory Visit (HOSPITAL_BASED_OUTPATIENT_CLINIC_OR_DEPARTMENT_OTHER): Payer: Medicare Other | Admitting: Internal Medicine

## 2013-07-13 ENCOUNTER — Ambulatory Visit: Payer: Medicare Other

## 2013-07-13 ENCOUNTER — Telehealth: Payer: Self-pay | Admitting: Internal Medicine

## 2013-07-13 VITALS — BP 146/74 | HR 86 | Temp 98.2°F | Resp 18 | Ht 62.0 in | Wt 132.2 lb

## 2013-07-13 DIAGNOSIS — E876 Hypokalemia: Secondary | ICD-10-CM | POA: Diagnosis not present

## 2013-07-13 DIAGNOSIS — C8590 Non-Hodgkin lymphoma, unspecified, unspecified site: Secondary | ICD-10-CM

## 2013-07-13 DIAGNOSIS — C8589 Other specified types of non-Hodgkin lymphoma, extranodal and solid organ sites: Secondary | ICD-10-CM | POA: Diagnosis not present

## 2013-07-13 DIAGNOSIS — R059 Cough, unspecified: Secondary | ICD-10-CM | POA: Diagnosis not present

## 2013-07-13 DIAGNOSIS — I2699 Other pulmonary embolism without acute cor pulmonale: Secondary | ICD-10-CM

## 2013-07-13 DIAGNOSIS — I5189 Other ill-defined heart diseases: Secondary | ICD-10-CM

## 2013-07-13 DIAGNOSIS — I428 Other cardiomyopathies: Secondary | ICD-10-CM | POA: Diagnosis not present

## 2013-07-13 DIAGNOSIS — D61810 Antineoplastic chemotherapy induced pancytopenia: Secondary | ICD-10-CM | POA: Diagnosis not present

## 2013-07-13 DIAGNOSIS — Z5181 Encounter for therapeutic drug level monitoring: Secondary | ICD-10-CM | POA: Diagnosis not present

## 2013-07-13 DIAGNOSIS — B370 Candidal stomatitis: Secondary | ICD-10-CM

## 2013-07-13 DIAGNOSIS — Z7901 Long term (current) use of anticoagulants: Secondary | ICD-10-CM | POA: Diagnosis not present

## 2013-07-13 DIAGNOSIS — R05 Cough: Secondary | ICD-10-CM

## 2013-07-13 LAB — URIC ACID (CC13): Uric Acid, Serum: 3.4 mg/dl (ref 2.6–7.4)

## 2013-07-13 LAB — CBC WITH DIFFERENTIAL/PLATELET
EOS%: 0.9 % (ref 0.0–7.0)
Eosinophils Absolute: 0 10*3/uL (ref 0.0–0.5)
HCT: 31.2 % — ABNORMAL LOW (ref 34.8–46.6)
LYMPH%: 14.6 % (ref 14.0–49.7)
MCH: 31.8 pg (ref 25.1–34.0)
MCHC: 33.1 g/dL (ref 31.5–36.0)
MCV: 96.2 fL (ref 79.5–101.0)
MONO#: 0.5 10*3/uL (ref 0.1–0.9)
MONO%: 10.8 % (ref 0.0–14.0)
NEUT#: 3.6 10*3/uL (ref 1.5–6.5)
NEUT%: 71.6 % (ref 38.4–76.8)
Platelets: 85 10*3/uL — ABNORMAL LOW (ref 145–400)
RBC: 3.25 10*6/uL — ABNORMAL LOW (ref 3.70–5.45)

## 2013-07-13 LAB — COMPREHENSIVE METABOLIC PANEL (CC13)
Alkaline Phosphatase: 60 U/L (ref 40–150)
Anion Gap: 12 mEq/L — ABNORMAL HIGH (ref 3–11)
CO2: 25 mEq/L (ref 22–29)
Creatinine: 0.8 mg/dL (ref 0.6–1.1)
Glucose: 117 mg/dl (ref 70–140)
Sodium: 144 mEq/L (ref 136–145)
Total Bilirubin: 0.41 mg/dL (ref 0.20–1.20)
Total Protein: 5.3 g/dL — ABNORMAL LOW (ref 6.4–8.3)

## 2013-07-13 LAB — FERRITIN CHCC: Ferritin: 1053 ng/ml — ABNORMAL HIGH (ref 9–269)

## 2013-07-13 LAB — IRON AND TIBC CHCC
Iron: 60 ug/dL (ref 41–142)
TIBC: 219 ug/dL — ABNORMAL LOW (ref 236–444)

## 2013-07-13 LAB — PROTIME-INR
INR: 1.8 — ABNORMAL LOW (ref 2.00–3.50)
Protime: 21.6 Seconds — ABNORMAL HIGH (ref 10.6–13.4)

## 2013-07-13 LAB — LACTATE DEHYDROGENASE (CC13): LDH: 249 U/L — ABNORMAL HIGH (ref 125–245)

## 2013-07-13 LAB — POCT INR: INR: 1.8

## 2013-07-13 MED ORDER — POTASSIUM CHLORIDE ER 10 MEQ PO TBCR: 10.0000 meq | EXTENDED_RELEASE_TABLET | Freq: Two times a day (BID) | ORAL | Status: DC

## 2013-07-13 NOTE — Patient Instructions (Signed)
Hypokalemia Hypokalemia means a low potassium level in the blood.Potassium is an electrolyte that helps regulate the amount of fluid in the body. It also stimulates muscle contraction and maintains a stable acid-base balance.Most of the body's potassium is inside of cells, and only a very small amount is in the blood. Because the amount in the blood is so small, minor changes can have big effects. PREPARATION FOR TEST Testing for potassium requires taking a blood sample taken by needle from a vein in the arm. The skin is cleaned thoroughly before the sample is drawn. There is no other special preparation needed. NORMAL VALUES Potassium levels below 3.5 mEq/L are abnormally low. Levels above 5.1 mEq/L are abnormally high. Ranges for normal findings may vary among different laboratories and hospitals. You should always check with your doctor after having lab work or other tests done to discuss the meaning of your test results and whether your values are considered within normal limits. MEANING OF TEST  Your caregiver will go over the test results with you and discuss the importance and meaning of your results, as well as treatment options and the need for additional tests, if necessary. A potassium level is frequently part of a routine medical exam. It is usually included as part of a whole "panel" of tests for several blood salts (such as Sodium and Chloride). It may be done as part of follow-up when a low potassium level was found in the past or other blood salts are suspected of being out of balance. A low potassium level might be suspected if you have one or more of the following:  Symptoms of weakness.  Abnormal heart rhythms.  High blood pressure and are taking medication to control this, especially water pills (diuretics).  Kidney disease that can affect your potassium level .  Diabetes requiring the use of insulin. The potassium may fall after taking insulin, especially if the  diabetes had been out of control for a while.  A condition requiring the use of cortisone-type medication or certain types of antibiotics.  Vomiting and/or diarrhea for more than a day or two.  A stomach or intestinal condition that may not permit appropriate absorption of potassium.  Fainting episodes.  Mental confusion. OBTAINING TEST RESULTS It is your responsibility to obtain your test results. Ask the lab or department performing the test when and how you will get your results.  Please contact your caregiver directly if you have not received the results within one week. At that time, ask if there is anything different or new you should be doing in relation to the results. TREATMENT Hypokalemia can be treated with potassium supplements taken by mouth and/or adjustments in your current medications. A diet high in potassium is also helpful. Foods with high potassium content are:  Peas, lentils, lima beans, nuts, and dried fruit.  Whole grain and bran cereals and breads.  Fresh fruit, vegetables (bananas, cantaloupe, grapefruit, oranges, tomatoes, honeydew melons, potatoes).  Orange and tomato juices.  Meats. If potassium supplement has been prescribed for you today or your medications have been adjusted, see your personal caregiver in time02 for a re-check. SEEK MEDICAL CARE IF:  There is a feeling of worsening weakness.  You experience repeated chest palpitations.  You are diabetic and having difficulty keeping your blood sugars in the normal range.  You are experiencing vomiting and/or diarrhea.  You are having difficulty with any of your regular medications. SEEK IMMEDIATE MEDICAL CARE IF:  You experience chest pain,  shortness of breath, or episodes of dizziness.  You have been having vomiting or diarrhea for more than 2 days.  You have a fainting episode. MAKE SURE YOU:   Understand these instructions.  Will watch your condition.  Will get help right away if  you are not doing well or get worse. Document Released: 07/16/2005 Document Revised: 10/08/2011 Document Reviewed: 01/16/2013 [facility] Patient Information 2014 [place], [place]. If cardiac dysfunction will change from doxorubicin to etoposide.      Etoposide, VP-16 injection What is this medicine? ETOPOSIDE, VP-16 (e toe POE side) is a chemotherapy drug. It is used to treat testicular cancer, lung cancer, and other cancers. This medicine may be used for other purposes; ask your health care provider or pharmacist if you have questions. COMMON BRAND NAME(S): Etopophos, Toposar, VePesid What should I tell my health care provider before I take this medicine? They need to know if you have any of these conditions: -infection -kidney disease -low blood counts, like low white cell, platelet, or red cell counts -an unusual or allergic reaction to etoposide, other chemotherapeutic agents, other medicines, foods, dyes, or preservatives -pregnant or trying to get pregnant -breast-feeding How should I use this medicine? This medicine is for infusion into a vein. It is administered in a hospital or clinic by a specially trained health care professional. Talk to your pediatrician regarding the use of this medicine in children. Special care may be needed. Overdosage: If you think you have taken too much of this medicine contact a poison control center or emergency room at once. NOTE: This medicine is only for you. Do not share this medicine with others. What if I miss a dose? It is important not to miss your dose. Call your doctor or health care professional if you are unable to keep an appointment. What may interact with this medicine? -cyclosporine -medicines to increase blood counts like filgrastim, pegfilgrastim, sargramostim -vaccines This list may not describe all possible interactions. Give your health care provider a list of all the medicines, herbs, non-prescription drugs, or dietary  supplements you use. Also tell them if you smoke, drink alcohol, or use illegal drugs. Some items may interact with your medicine. What should I watch for while using this medicine? Visit your doctor for checks on your progress. This drug may make you feel generally unwell. This is not uncommon, as chemotherapy can affect healthy cells as well as cancer cells. Report any side effects. Continue your course of treatment even though you feel ill unless your doctor tells you to stop. In some cases, you may be given additional medicines to help with side effects. Follow all directions for their use. Call your doctor or health care professional for advice if you get a fever, chills or sore throat, or other symptoms of a cold or flu. Do not treat yourself. This drug decreases your body's ability to fight infections. Try to avoid being around people who are sick. This medicine may increase your risk to bruise or bleed. Call your doctor or health care professional if you notice any unusual bleeding. Be careful brushing and flossing your teeth or using a toothpick because you may get an infection or bleed more easily. If you have any dental work done, tell your dentist you are receiving this medicine. Avoid taking products that contain aspirin, acetaminophen, ibuprofen, naproxen, or ketoprofen unless instructed by your doctor. These medicines may hide a fever. Do not become pregnant while taking this medicine. Women should inform their doctor if they  wish to become pregnant or think they might be pregnant. There is a potential for serious side effects to an unborn child. Talk to your health care professional or pharmacist for more information. Do not breast-feed an infant while taking this medicine. What side effects may I notice from receiving this medicine? Side effects that you should report to your doctor or health care professional as soon as possible: -allergic reactions like skin rash, itching or hives,  swelling of the face, lips, or tongue -low blood counts - this medicine may decrease the number of white blood cells, red blood cells and platelets. You may be at increased risk for infections and bleeding. -signs of infection - fever or chills, cough, sore throat, pain or difficulty passing urine -signs of decreased platelets or bleeding - bruising, pinpoint red spots on the skin, black, tarry stools, blood in the urine -signs of decreased red blood cells - unusually weak or tired, fainting spells, lightheadedness -breathing problems -changes in vision -mouth or throat sores or ulcers -pain, redness, swelling or irritation at the injection site -pain, tingling, numbness in the hands or feet -redness, blistering, peeling or loosening of the skin, including inside the mouth -seizures -vomiting Side effects that usually do not require medical attention (report to your doctor or health care professional if they continue or are bothersome): -diarrhea -hair loss -loss of appetite -nausea -stomach pain This list may not describe all possible side effects. Call your doctor for medical advice about side effects. You may report side effects to FDA at 1-800-FDA-1088. Where should I keep my medicine? This drug is given in a hospital or clinic and will not be stored at home. NOTE: This sheet is a summary. It may not cover all possible information. If you have questions about this medicine, talk to your doctor, pharmacist, or health care provider.  2014, [employer]. (2007-11-17 17:24:12)

## 2013-07-13 NOTE — Telephone Encounter (Signed)
Per desk RN ok to move all appts for the week of 1/12 to the same day. Patient called adn aware of appts

## 2013-07-13 NOTE — Telephone Encounter (Signed)
Per staff message and POF I have scheduled appts.  JMW  

## 2013-07-13 NOTE — Telephone Encounter (Signed)
Gave pt appt for lab and MD on january 2015, emailed Marcelino Duster regarding chemo and -2decho order taken to precert

## 2013-07-13 NOTE — Patient Instructions (Signed)
INR right below goal Increase dose to 5 mg on Mondays and Coumadin 2.5 daily.  Will check PT/INR on 07/20/13 labs at 8:00, 8:30 infusion.  We will see you during your infusion.

## 2013-07-13 NOTE — Progress Notes (Addendum)
INR just below goal today after being in range last visit Pt is doing well She was seen in clinic today with her Husband after an MD visit with Dr. Rosie Fate No treatment today due to low platelet counts of 85k, treatment rescheduled to next Monday 12/22 Pt is doing well with no complaints No unusual bleeding, some minor bruising on arm from IV stick No new medications or diet changes Will slightly increase dose this week and recheck INR with treatment next week Discussed with Dr. Rosie Fate, no lovenox bridge needed at this time will only boost coumadin dose today Plan: Increase dose to 5 mg on Mondays and Coumadin 2.5 daily.  Will check PT/INR on 07/20/13 labs at 8:00, 8:30 infusion.   We will see her during your infusion.

## 2013-07-13 NOTE — Progress Notes (Signed)
[facility] OFFICE PROGRESS NOTE  [name], MD [address] [place] [address]  DIAGNOSIS: Non-Hodgkins lymphoma - Plan: CBC with Differential, Basic metabolic panel (Bmet) - [facility]  Diastolic dysfunction, grade I - Plan: 2D Echocardiogram without contrast  Antineoplastic chemotherapy induced pancytopenia - Plan: CBC with Differential, Basic metabolic panel (Bmet) - [facility]  Hypokalemia - Plan: potassium chloride (K-DUR) 10 MEQ tablet  Thrush  Cough  Chief Complaint  Patient presents with  . Lymphoma    CURRENT THERAPY: R miniCHOP x  1, given on 09/20; R-Mini CHO cycle #2 given on 06/01/13 followed by neulasta 6 mg SQ on Day #2.  Scheduled for R-Mini CHO cycle #3 today.  She had psychosis, memory lost, hyperglycemia secondary to prednisone following her first cycle. Planning to complete 6 to 8 cycles.   INTERVAL HISTORY: Tonya Wise 72 y.o. 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   She was last seen by me on 06/22/2013.   Today, she is accompanied by her husband Tonya Wise.  She was seen and evaluated by Dr. Tresa Endo based on tachycardia and EKG changes and had a myocardial nuclear scan revealing an EF of 42%. In addition, her pro-BNP was significantly elevated.  As previously noted, she  had an interim PET/CT following 2 cycles which showed a significant response to therapy (as outlined below).  In addition, it showed a new pulmonary embolus.   She was started on lovenox and coumadin bridge.  She was started on low dose ace inhibitor.    She complains mostly of a non-productive cough.  She takes protonix.  She is eating better. She denies chest pain presently.  She is on lasix daily with resolution in her lower extremity edema.  She continues to complain of weakness but feels her strength has improved.    She states she feels very tired  following her chemotherapy for 3-4 days but then recovers.  She reports some hair lost and taste changes.  She states that she cannot eat "sweets" anymore because of the taste.  She denies fevers or chills or night sweats.  She reports she is excited about Christmas holiday and spending time with family.   MEDICAL HISTORY: Past Medical History  Diagnosis Date  . Hypertension   . Renal insufficiency   . Osteopenia   . Allergy   . Heart murmur   . GERD (gastroesophageal reflux disease)   . Anemia   . Hypercalcemia 03/27/2013  . Non-Hodgkins lymphoma 04/06/2013  . 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    ALLERGIES:  is allergic to penicillins.  MEDICATIONS: has a current medication list which includes the following prescription(s): albuterol, allopurinol, aspirin, bisoprolol, chlorpheniramine-hydrocodone, lisinopril, nystatin, omeprazole, ondansetron, oxycodone, potassium chloride, valacyclovir, warfarin, dextromethorphan, and lidocaine-prilocaine.  SURGICAL HISTORY:  Past Surgical History  Procedure Laterality Date  . Appendectomy    . Colonoscopy  2008  . Spine surgery    .  Back surgery      LOWER BACK TWICE  . Abdominal hysterectomy    . Esophagogastroduodenoscopy N/A 03/29/2013     Procedure: ESOPHAGOGASTRODUODENOSCOPY (EGD);  Surgeon: Charna Elizabeth, MD;  Location: [facility] ENDOSCOPY;  Service: Endoscopy;  Laterality: N/A;  . Inguinal lymph node biopsy Right 03/31/2013    Procedure: INGUINAL LYMPH NODE BIOPSY;  Surgeon: Cherylynn Ridges, MD;  Location: [facility] OR;  Service: General;  Laterality: Right;   ONCOLOGY HISTORY: She had a prior hospitalization from September 12 from September of 26 2014 secondary to hypercalcemia, renal failure and dehydration. During this period, the patient's clinical course was complicated by pneumonia and she was treated with intravenous antibiotics and the development of lethargy and confusion which resolved over the clinical course. She was readmitted from home on 04/25/2013 for worsening mouth sores/oral thrush, odynophagia, failure to thrive. Patient was started on high-dose acyclovir for empiric herpetic encephalitis and/or HSV esophagitis.  She was discharged to [facility] on 10/01 and continue physical rehabilitation and medical provided by Dr. Angelina Ok.  She was discharged from the [facility] on 10/21 and now is at home.  She reports increased appetite and ability to tolerate po.  She is much more communicative and today is accompanied by her daughter and husband Tonya Wise.   She is concerned about the puffy feet.  She has been on lasix 20 mg daily with moderate improvement. She denies any further fevers or night sweats.  She denies any recent blood transfusions.   On initial evaluation, she was sent by her PCP to [facility] Emergency department because of anemia (Hgb 6.4) on August 29th, 2014. She reported that she started to lost her appetite and started to lose weight about 2 months ago. She became increasingly weak and fatigued. In addition, she also developed cough with green sputum and dyspnea. Patient also noticed new headaches, diminished hearing. She also complained fevers (persistent) and nightsweats that at times drenched her bedsheets. She reported  nasal congestion and white nasal discharge, feeling some lump in throat. On admission, she also complained of abdominal pain, episodes of nausea, low back pain. Patient reported history of colonoscopy and EGD several years ago. She received 3 units of RBC. GI consult was done. Patient was admitted and transfused a total of 3 units of PRBC. GI was consulted and an EGD done on 8/31 did not show any foci for bleeding. CT scan of the abdomen and chest, showed significant lymphadenopathy, suspicious of lymphoma. Hematology/oncology was also consulted and recommended core needle biopsy. Surgery performed a biopsy of R inguinal lymph node revealed Diffuse large B cell lymphoma.    REVIEW OF SYSTEMS:   Constitutional: Denies fevers, chills or abnormal weight loss Eyes: Denies blurriness of vision Ears, nose, mouth, throat, and face: + very mild mucositis but overall improvement with some discoloration on the surface of her tongue but she denies  sore throat Respiratory: Denies cough, dyspnea or wheezes Cardiovascular: Denies palpitation, chest discomfort; positive for lower extremity swelling Gastrointestinal:  Denies nausea, heartburn or change in bowel habits Skin: Denies abnormal skin rashes Lymphatics: Denies new lymphadenopathy or easy bruising Neurological:Denies numbness, tingling or new weaknesses Behavioral/Psych: Mood is stable, no new changes  All other systems were reviewed with the patient and are negative.  PHYSICAL EXAMINATION: ECOG PERFORMANCE STATUS: 1 - Symptomatic but completely ambulatory  Blood pressure 146/74, pulse 86, temperature 98.2 F (36.8 C), temperature source Oral, resp. rate 18, height 5\' 2"  (1.575 m), weight 132 lb 3.2 oz (59.966  kg), SpO2 100.00%.  GENERAL:alert, no distress and comfortable; ambulates by cane but required assistance to the table.  SKIN: skin color, texture, turgor are normal, no rashes or significant lesions; + R sided Port a cath EYES: normal,  Conjunctiva are pink and non-injected, sclera clear  OROPHARYNX:no exudate, no erythema and lips, buccal mucosa, and tongue with a grayish hue but without bleeding and minimal swelling (improved since last visit).  No lip lesions identified. OP clear.  NECK: supple, thyroid normal size, non-tender, without nodularity LYMPH:  no palpable lymphadenopathy in the cervical, axillary or supraclavicular; could not palpate axillary lymph on the right.  LUNGS: clear to auscultation and percussion with normal breathing effort HEART: regular rate & rhythm and no murmurs and no lower extremity edema  ABDOMEN:abdomen soft, non-tender and normal bowel sounds Musculoskeletal:no cyanosis of digits and no clubbing  NEURO: alert & oriented x 3 with fluent speech, no focal motor/sensory deficits  LABORATORY DATA: Results for orders placed in visit on 07/13/13 (from the past 48 hour(s))  CBC WITH DIFFERENTIAL     Status: Abnormal   Collection Time    07/13/13  8:03 AM      Result Value Range   WBC 5.0  3.9 - 10.3 10e3/uL   NEUT# 3.6  1.5 - 6.5 10e3/uL   HGB 10.3 (*) 11.6 - 15.9 g/dL   HCT 16.1 (*) 09.6 - 04.5 %   Platelets 85 (*) 145 - 400 10e3/uL   MCV 96.2  79.5 - 101.0 fL   MCH 31.8  25.1 - 34.0 pg   MCHC 33.1  31.5 - 36.0 g/dL   RBC 4.09 (*) 8.11 - 9.14 10e6/uL   RDW 20.4 (*) 11.2 - 14.5 %   lymph# 0.7 (*) 0.9 - 3.3 10e3/uL   MONO# 0.5  0.1 - 0.9 10e3/uL   Eosinophils Absolute 0.0  0.0 - 0.5 10e3/uL   Basophils Absolute 0.1  0.0 - 0.1 10e3/uL   NEUT% 71.6  38.4 - 76.8 %   LYMPH% 14.6  14.0 - 49.7 %   MONO% 10.8  0.0 - 14.0 %   EOS% 0.9  0.0 - 7.0 %   BASO% 2.1 (*) 0.0 - 2.0 %  COMPREHENSIVE METABOLIC PANEL (CC13)     Status: Abnormal   Collection Time    07/13/13  8:03 AM      Result Value Range   Sodium 144  136 - 145 mEq/L   Potassium 3.0 (*) 3.5 - 5.1 mEq/L   Chloride 107  98 - 109 mEq/L   CO2 25  22 - 29 mEq/L   Glucose 117  70 - 140 mg/dl   BUN 5.4 (*) 7.0 - 78.2 mg/dL   Creatinine  0.8  0.6 - 1.1 mg/dL   Total Bilirubin 9.56  0.20 - 1.20 mg/dL   Alkaline Phosphatase 60  40 - 150 U/L   AST 16  5 - 34 U/L   ALT 9  0 - 55 U/L   Total Protein 5.3 (*) 6.4 - 8.3 g/dL   Albumin 3.2 (*) 3.5 - 5.0 g/dL   Calcium 8.9  8.4 - 21.3 mg/dL   Anion Gap 12 (*) 3 - 11 mEq/L  LACTATE DEHYDROGENASE (CC13)     Status: Abnormal   Collection Time    07/13/13  8:03 AM      Result Value Range   LDH 249 (*) 125 - 245 U/L  PROTIME-INR     Status: Abnormal   Collection Time  07/13/13  8:03 AM      Result Value Range   Protime 21.6 (*) 10.6 - 13.4 Seconds   INR 1.80 (*) 2.00 - 3.50   Comment: INR is useful only to assess adequacy of anticoagulation with coumadin when comparing results from different labs. It should not be used to estimate bleeding risk or presence/abscense of coagulopathy in patients not on coumadin. Expected INR ranges for      nontherapeutic patients is 0.88 - 1.12.   Lovenox No    FERRITIN [facility]     Status: Abnormal   Collection Time    07/13/13  8:03 AM      Result Value Range   Ferritin 1,053 (*) 9 - 269 ng/ml  IRON AND TIBC [facility]     Status: Abnormal   Collection Time    07/13/13  8:03 AM      Result Value Range   Iron 60  41 - 142 ug/dL   TIBC 161 (*) 096 - 045 ug/dL   UIBC 409  811 - 914 ug/dL   %SAT 27  21 - 57 %  HEPATITIS B SURFACE ANTIGEN     Status: None   Collection Time    07/13/13  8:03 AM      Result Value Range   Hepatitis B Surface Ag NEGATIVE  NEGATIVE  HEPATITIS B CORE ANTIBODY, TOTAL     Status: None   Collection Time    07/13/13  8:03 AM      Result Value Range   Hep B Core Total Ab NON REACTIVE  NON REACTIVE  HEPATITIS B CORE ANTIBODY, IGM     Status: None   Collection Time    07/13/13  8:03 AM      Result Value Range   Hep B C IgM NON REACTIVE  NON REACTIVE   Comment: High levels of Hepatitis B Core IgM antibody are detectableduring the acute stage of Hepatitis B. This antibody is usedto differentiate current from past HBV  infection.   URIC ACID (CC13)     Status: None   Collection Time    07/13/13  8:03 AM      Result Value Range   Uric Acid, Serum 3.4  2.6 - 7.4 mg/dl    Labs:  Lab Results  Component Value Date   WBC 5.0 07/13/2013   HGB 10.3* 07/13/2013   HCT 31.2* 07/13/2013   MCV 96.2 07/13/2013   PLT 85* 07/13/2013   NEUTROABS 3.6 07/13/2013      Chemistry      Component Value Date/Time   NA 144 07/13/2013 0803   NA 137 04/29/2013 0500   K 3.0* 07/13/2013 0803   K 3.5 04/29/2013 0500   CL 103 04/29/2013 0500   CO2 25 07/13/2013 0803   CO2 25 04/29/2013 0500   BUN 5.4* 07/13/2013 0803   BUN 16 04/29/2013 0500   CREATININE 0.8 07/13/2013 0803   CREATININE 0.96 04/29/2013 0500   CREATININE 1.37* 03/26/2013 1338      Component Value Date/Time   CALCIUM 8.9 07/13/2013 0803   CALCIUM 7.0* 04/29/2013 0500   ALKPHOS 60 07/13/2013 0803   ALKPHOS 154* 04/25/2013 1410   AST 16 07/13/2013 0803   AST 35 04/25/2013 1410   ALT 9 07/13/2013 0803   ALT 16 04/25/2013 1410   BILITOT 0.41 07/13/2013 0803   BILITOT 1.0 04/25/2013 1410     Basic Metabolic Panel:  Recent Labs Lab 07/13/13 0803  NA 144  K 3.0*  CO2 25  GLUCOSE 117  BUN 5.4*  CREATININE 0.8  CALCIUM 8.9   GFR Estimated Creatinine Clearance: 50.3 ml/min (by C-G formula based on Cr of 0.8). Liver Function Tests:  Recent Labs Lab 07/13/13 0803  AST 16  ALT 9  ALKPHOS 60  BILITOT 0.41  PROT 5.3*  ALBUMIN 3.2*   CBC:  Recent Labs Lab 07/13/13 0803  WBC 5.0  NEUTROABS 3.6  HGB 10.3*  HCT 31.2*  MCV 96.2  PLT 85*   Results for SENIAH, LAWRENCE (MRN [id number]) as of 04/17/2013 16:25   Ref. Range  04/16/2013 18:12   Hepatitis B Surface Ag  Latest Range: NEGATIVE  NEGATIVE   Hep B S Ab  Latest Range: NEGATIVE  NEGATIVE   Hep B Core Total Ab  Latest Range: NEGATIVE  NEGATIVE     Studies:  Bone Marrow c/w with DLBCL per preliminary report. Await final report.  Pathology (03/31/2013):  Lymph node for lymphoma, Right,  Inguinal  - DIFFUSE LARGE B CELL LYMPHOMA  - SEE ONCOLOGY TABLE.  Microscopic Comment  LYMPHOMA  Histologic type: Non-Hodgkin's lymphoma, diffuse large cell type. Grade (if applicable): High grade.  Flow cytometry: A minor monoclonal B cell population with lambda light chain restriction. ([id number]).  Immunohistochemical stains: BCL-2, BCL-6, CD10, CD138, CD20, CD3, CD30, LCA, CD43, and CD79a with appropriate controls.  Touch preps/imprints: Abundance of large lymphoid cells with prominent nucleoli admixed with small lymphoid cells.  Comments: The sections show effacement of the lymph nodal architecture by a diffuse relatively monomorphicpopulation of large lymphoid cells with vesicular chromatin, prominent nucleoli and eosinophilic to amphophilic cytoplasm. This is associated with brisk mitoses and areas of tumor necrosis. The appearance is mostly diffuse with lack of follicular of nodular structures. Flow cytometric analysis was performed ([id number]) and shows a minor population of monoclonal B cells displaying pan B cell antigens including CD20.  Immunohistochemical stains were performed and show that the large atypical lymphoid cells are positive for LCA, CD79a, CD20, BCL-2 and BCL-6. There is partial positivity for CD30. No appreciable positivity is seen with CD10 or CD138. CD3 and CD43 highlight the admixed T cell component present in the background. The overall histologic and immunophenotypic features are consistent with diffuse large B cell lymphoma.  Specimen Gross and Clinical Information  Specimen(s) Obtained:  Lymph node for lymphoma, Right, Inguinal  1 of 2  FINAL for NIKA, YAZZIE ([id number])  Specimen Clinical Information  Right Inguinal Adenopathy (jmc)  Gross  Received fresh is a 3.4 x 2.8 x 2.5 cm encapsulated, rubbery, ovoid nodule. There is a separate, focally  disrupted 2.5 x 2.2 x 1.2 cm nodular portion of tissue. The cut surfaces are solid, tan-white with focal slight   hemorrhage. A portion of the specimen is placed in RPMI for flow cytometry and touch preparations are made  from the cut surfaces. Sections are submitted in three cassettes. (GRP:ecj 03/31/2013)  Stain(s) used in Diagnosis:  The following stain(s) were used in diagnosing the case: BCl 6 , CD 79a, CD 30, BCl 2, CD 138, CD 3, CD 43,  CD45 (LCA), CD-10, CD 20. The control(s) stained appropriately.   RADIOGRAPHIC STUDIES: 06/16/2013 NUCLEAR MEDICINE PET SKULL BASE TO THIGH FASTING BLOOD GLUCOSE: Value: 105mg /dl TECHNIQUE: 62.9 mCi B-28 FDG was injected intravenously. CT data was obtained  and used for attenuation correction and anatomic localization only. (This was not acquired as a diagnostic CT examination.) Additional exam technical data entered on technologist worksheet.  COMPARISON: CT scan 03/30/2013.  FINDINGS: NECK No hypermetabolic lymph nodes in the neck.  CHEST There is a single right axillary lymph node measuring 16 x 10 mm in demonstrates FDG uptake with SUV max of 5.5. This would suggest some residual metabolically active lymphoma. The mediastinal and hilar lymph nodes are much smaller than a previous chest CT and I do not see any definite metabolic activity. No worrisome pulmonary lesions. The right middle lobe subpleural density is relatively stable and is likely scarring or atelectasis. No abnormal FDG uptake is identified. There is a small right pleural effusion.  ABDOMEN/PELVIS No persistence/residual lymphadenopathy in the abdomen. A few small scattered lymph nodes are noted but the bulky adenopathy has resolved. No metabolic activity is identified in the remaining small lymph nodes. Stable mild splenomegaly with numerous small lesions but no hyper metabolism. SKELETON Diffuse osseous uptake is likely due to rebound marrow activity or marrow stimulating drugs. IMPRESSION: 1. Single metabolically active 16 x 10 mm in the right axilla. 2. Persistent but definitely smaller mediastinal and hilar  lymph node without definite metabolic activity. 3. Resolution of abdominal adenopathy with some scattered residual  lymph nodes but no metabolic activity. 4. Persistent mild splenomegaly and small splenic lesions without obvious hyper metabolism.   06/16/2013. CT CHEST, ABDOMEN, AND PELVIS WITH CONTRAST TECHNIQUE: Multidetector CT imaging of the chest, abdomen and pelvis was performed following the standard protocol during bolus  administration of intravenous contrast. CONTRAST: OMNIPAQUE IOHEXOL 300 MG/ML SOLN  COMPARISON: 03/30/2013. FINDINGS: CT CHEST FINDINGS The chest wall demonstrates a right-sided Port-A-Cath in good position without complicating features. Bilateral breast prosthesis  are noted. The bulky bilateral axillary lymphadenopathy has resolved. There is a small residual right axillary node on image number 12 which measures 16 x 12 mm. It previously measured 27 x 20  mm. It did show slight hypermetabolism on the PET scan. Other normal size axillary lymph nodes did not show any hyper metabolism. The bony thorax is intact. No destructive bone lesions or spinal  canal compromise. The heart is normal in size. No pericardial effusion. No mediastinal or hilar mass. The bulky lymphadenopathy seen on the prior study has significantly decreased in size. The subcarinal nodal mass previously measured 4.0 x 2.2 cm and now measures 2.0 x 1.3 cm. The aorta is normal in caliber. Stable atherosclerotic calcifications. There is clot noted in the left lower lobe pulmonary artery  consistent with pulmonary embolism. There is also a small amount of string like adherent clot in the main left pulmonary artery. Examination of the lung parenchyma demonstrates no worrisome  pulmonary nodules. Stable area of subpleural scarring type changes in the right middle lobe. There is a small right pleural effusion with overlying atelectasis. CT ABDOMEN AND PELVIS FINDINGS The liver demonstrates diffuse fatty  infiltration but no focal hepatic lesions or biliary dilatation. The gallbladder is normal. No common bowel duct dilatation. The pancreas is normal. The spleen remains mildly enlarged and there are multiple low-attenuation lesions. The adrenal glands and kidneys are unremarkable and  unchanged. The stomach, duodenum, small bowel and colon are unremarkable. No inflammatory changes, mass lesions or obstructive findings. There are small scattered mesenteric and retroperitoneal lymph nodes. This reflects a significant improvement since the prior CT scan where  there was bulky adenopathy. The aorta and branch vessels are patent. Stable atherosclerotic calcifications. The uterus and ovaries are unremarkable and stable. The bladder is normal. No pelvic mass, adenopathy or free pelvic fluid collections. No inguinal mass or adenopathy. The borderline inguinal lymph  nodes have resolved. No significant bony findings. Stable lumbar fusion changes.  IMPRESSION: 1. Significant interval reduction in axillary lymphadenopathy. There is 1 small residual right axillary lymph node measuring 16 x 12 mm which did show FDG uptake. 2. Significant reduction in mediastinal and hilar lymphadenopathy. 3. Left-sided pulmonary embolism. 4. Resolution of bulky abdominal lymphadenopathy. 5. Interval decrease in size of the spleen (15 x 14 x 10.5 versus 12  x 12 x 9.5). Persistent small lesions.   ASSESSMENT: Tonya Wise 72 y.o. female with a history of Non-Hodgkins lymphoma - Plan: CBC with Differential, Basic metabolic panel (Bmet) - [facility]  Diastolic dysfunction, grade I - Plan: 2D Echocardiogram without contrast  Antineoplastic chemotherapy induced pancytopenia - Plan: CBC with Differential, Basic metabolic panel (Bmet) - [facility]  Hypokalemia - Plan: potassium chloride (K-DUR) 10 MEQ tablet  Thrush  Cough   PLAN:  1. DLBCL, high grade. stage IV with bone marrow involvement and B symptoms (fevers and nightsweats).  --Her  International Prognostic index is 4 (NEJM, 1993) with a 3-year survival rate of 59% based on an elevated LDH, age greater than 90 years old, performance status of 2, Stage IV. She has extranodal involvement in the spleen and in the bone marrow.  --On prior visits, we had a detailed discussion the patient and her family starting R-miniCHOP (Peyrade F, [employer]) chemotherapy including the benefits of possibly controlling her disease and the side-effects of therapy including risks of bone marrow toxicity including but not limited to myelosuppression which can lead to the development of life-threatening infections, nausea/vomiting, diarrhea, ananaphylaxis, neuropathy, cystitis, flu-like symptoms. She understood these risk and agreed to proceed with chemotherapy. We will HOLD R-mini CHO (excluding the prednisone at her request due to altered mental status changes while on prednisone and worsening hypergycemia) q 21 days for 6-8 cycles consisting of rituximab, cyclophosphamide, doxorubicin, vincristine. We will repeat echocardiagram to determine extent of cardiomyopathy and in consideration of changing therapy to RCEOP instead of RCHOP.   In discussion with pharmacy, initially we choose R-miniCHO based on her poor nutritional reserve and prolonged recovery during her 1st cycle. We discussed the risks associated with smaller dose includes less effective treatment potentially but may allow her more recovery.  We will reassess her physical state and discuss increasing her R-CHO based on continued recovery.  Given her recent PE, questionable tachycardia related to her diastolic dysfunction.  She is having this further worked up.    --She will continue prophylaxis with allopurinol  300 mg daily. She will start anti-emetics with zofran 8 mg two times daily starting the day after chemo for 3 days then two times a day prn for nausea or vomiting.   -- Her restaging scans on (06/16/2013) demonstrated significant  response as noted above. PET showed a single metabolically active 16 x 10 mm in the right axilla; persistent but definitely smaller mediastinal and hilar lymph node without definite metabolic activity; resolution of abdominal adenopathy with some scattered residual lymph nodes but no metabolic activity; persistent mild splenomegaly and small splenic lesions without obvious hyper metabolism.   2. Cardiomyopathy NOS --She had baseline diastolic dysfunction.  We will echocardiogram to determine if significant changes from baseline and if so we will discontinue doxorubicin for etoposide.    3. 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 -- Her counts have nearly recovered.  She is no longer neutropenic with an ANC of 3.6 and her plts are  85,000.  We will transfuse pRBCs based on symptoms and to maintain a  hemoglobin greater than 8.   She denies any recent infections.  Her hemoglobin is 10.3 today.   4. 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  She is coumdain 5 mg daily.  Her INR is subtherapeutic today at 1.8.   She is being followed by anti-coagulation clinic. Coumadin education was reviewed including indication, INR testing, S/S of bleeding, Rx and OTC drug interaction, herbal medications, Vit K containing foods and EtOH intake.   5. 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   She remains on valtrex 500 mg daily for viral prophylaxis.     6. Poor nutritional, hypokalemia.  --Continue to supplement diet with Ensure complete 3 times daily with meals.  --Provided potassium chloride 10 meQ bid for four days.  She was on lasix also so she may require long-term supplementation with lasix.   7. Deconditioning secondary to chronic illness and prior hospitalization. --Continue physical therapy as tolerated.  Patient requested an extension for additional core  strengthening exercises.   8. Follow-up.   --Patient will follow-up for an office visit with CBC, CMP, LDH in consideration for Cycle #5 of R-miniCHO or R-mini-CEO on 08/10/2013.  Labs in two week.    All questions were answered. The patient knows to call the clinic with any problems, questions or concerns. We can certainly see the patient much sooner if necessary.   I spent 25 minutes counseling the patient face to face. The total time spent in the appointment was 40 minutes.    Banesa Tristan, MD 07/14/2013 5:08 AM

## 2013-07-14 ENCOUNTER — Encounter: Payer: Self-pay | Admitting: Medical Oncology

## 2013-07-14 ENCOUNTER — Ambulatory Visit: Payer: Medicare Other

## 2013-07-14 ENCOUNTER — Other Ambulatory Visit: Payer: Self-pay | Admitting: Internal Medicine

## 2013-07-14 ENCOUNTER — Encounter: Payer: Self-pay | Admitting: Internal Medicine

## 2013-07-14 DIAGNOSIS — R05 Cough: Secondary | ICD-10-CM | POA: Insufficient documentation

## 2013-07-14 LAB — BETA 2 MICROGLOBULIN, SERUM: Beta-2 Microglobulin: 3.79 mg/L — ABNORMAL HIGH (ref 1.01–1.73)

## 2013-07-14 LAB — HEPATITIS B SURFACE ANTIGEN: Hepatitis B Surface Ag: NEGATIVE

## 2013-07-14 LAB — HEPATITIS B CORE ANTIBODY, IGM: Hep B C IgM: NONREACTIVE

## 2013-07-14 LAB — HEPATITIS B CORE ANTIBODY, TOTAL: Hep B Core Total Ab: NONREACTIVE

## 2013-07-15 ENCOUNTER — Telehealth: Payer: Self-pay | Admitting: Internal Medicine

## 2013-07-15 NOTE — Telephone Encounter (Signed)
Nurse Kathy Breach requested we schedule 2D echo prior to chemo per Dr Charlesetta Garibaldi order [facility] and sched for 12/18 at 11am at [facility] arrive at 10:45 am LVMM on home phone adv pt of same shh

## 2013-07-15 NOTE — Telephone Encounter (Signed)
Scheduler notified of 2D-echo ordered for this week.  Confirmed with [employer] this patient has regular Medicare and a supplement so no prior authorization required.

## 2013-07-16 ENCOUNTER — Ambulatory Visit (HOSPITAL_COMMUNITY)
Admission: RE | Admit: 2013-07-16 | Discharge: 2013-07-16 | Disposition: A | Discharge status: Home / Self Care | Payer: Medicare Other | Source: Ambulatory Visit | Attending: Internal Medicine | Admitting: Internal Medicine

## 2013-07-16 ENCOUNTER — Telehealth: Payer: Self-pay | Admitting: Internal Medicine

## 2013-07-16 ENCOUNTER — Telehealth: Payer: Self-pay | Admitting: Cardiovascular Disease

## 2013-07-16 DIAGNOSIS — I059 Rheumatic mitral valve disease, unspecified: Secondary | ICD-10-CM | POA: Insufficient documentation

## 2013-07-16 DIAGNOSIS — I10 Essential (primary) hypertension: Secondary | ICD-10-CM | POA: Diagnosis not present

## 2013-07-16 DIAGNOSIS — I5189 Other ill-defined heart diseases: Secondary | ICD-10-CM

## 2013-07-16 DIAGNOSIS — I428 Other cardiomyopathies: Secondary | ICD-10-CM | POA: Diagnosis not present

## 2013-07-16 NOTE — Telephone Encounter (Signed)
Wants to have her lab work today-she need an order faxed over to [address] [facility].She did not not know the name of the lab.

## 2013-07-16 NOTE — Telephone Encounter (Signed)
Talked to pt's husband and hgave him appt for lab,md,chemo for december and Janaury 2015

## 2013-07-16 NOTE — Progress Notes (Signed)
Echo Lab  2D Echocardiogram completed.  Dontrae Morini L Nyna Chilton, RDCS 07/16/2013 12:05 PM

## 2013-07-16 NOTE — Telephone Encounter (Signed)
Returned call and pt verified x 2.  Pt informed message received and if she does not have lab orders given at discharge, then she can still present fasting to the lab to have drawn as they are in the computer.  Pt verbalized understanding and stated she had a boiled egg this morning.  Pt advised to go to lab tomorrow morning fasting.  Pt verbalized understanding and agreed w/ plan.  Pt will call back if lab has difficulty finding orders.  [address] is a Financial risk analyst.

## 2013-07-17 ENCOUNTER — Other Ambulatory Visit: Payer: Self-pay | Admitting: Internal Medicine

## 2013-07-17 ENCOUNTER — Other Ambulatory Visit: Payer: Self-pay | Admitting: Medical Oncology

## 2013-07-17 DIAGNOSIS — Z13220 Encounter for screening for lipoid disorders: Secondary | ICD-10-CM | POA: Diagnosis not present

## 2013-07-17 DIAGNOSIS — R5381 Other malaise: Secondary | ICD-10-CM | POA: Diagnosis not present

## 2013-07-17 DIAGNOSIS — Z79899 Other long term (current) drug therapy: Secondary | ICD-10-CM | POA: Diagnosis not present

## 2013-07-17 DIAGNOSIS — C8590 Non-Hodgkin lymphoma, unspecified, unspecified site: Secondary | ICD-10-CM

## 2013-07-17 LAB — COMPREHENSIVE METABOLIC PANEL
ALT: 8 U/L (ref 0–35)
AST: 21 U/L (ref 0–37)
Albumin: 3.5 g/dL (ref 3.5–5.2)
Alkaline Phosphatase: 53 U/L (ref 39–117)
BUN: 6 mg/dL (ref 6–23)
Calcium: 8.5 mg/dL (ref 8.4–10.5)
Chloride: 102 mEq/L (ref 96–112)
Creat: 0.85 mg/dL (ref 0.50–1.10)
Glucose, Bld: 81 mg/dL (ref 70–99)
Total Bilirubin: 0.5 mg/dL (ref 0.3–1.2)

## 2013-07-17 LAB — LIPID PANEL
Cholesterol: 234 mg/dL — ABNORMAL HIGH (ref 0–200)
LDL Cholesterol: 131 mg/dL — ABNORMAL HIGH (ref 0–99)
Total CHOL/HDL Ratio: 6 Ratio
Triglycerides: 319 mg/dL — ABNORMAL HIGH (ref <150)
VLDL: 64 mg/dL — ABNORMAL HIGH (ref 0–40)

## 2013-07-17 LAB — CBC
HCT: 32.6 % — ABNORMAL LOW (ref 36.0–46.0)
Hemoglobin: 11 g/dL — ABNORMAL LOW (ref 12.0–15.0)
MCHC: 33.7 g/dL (ref 30.0–36.0)
RBC: 3.59 MIL/uL — ABNORMAL LOW (ref 3.87–5.11)
RDW: 19 % — ABNORMAL HIGH (ref 11.5–15.5)

## 2013-07-17 MED ORDER — ETOPOSIDE 50 MG PO CAPS: 50.0000 mg/m2/d | ORAL_CAPSULE | Freq: Every day | ORAL | Status: DC

## 2013-07-17 MED ORDER — ETOPOSIDE 50 MG PO CAPS: 50.0000 mg/m2/d | ORAL_CAPSULE | Freq: Every day | ORAL | Status: AC

## 2013-07-20 ENCOUNTER — Ambulatory Visit (HOSPITAL_BASED_OUTPATIENT_CLINIC_OR_DEPARTMENT_OTHER): Payer: Medicare Other | Admitting: Pharmacist

## 2013-07-20 ENCOUNTER — Telehealth: Payer: Self-pay | Admitting: Internal Medicine

## 2013-07-20 ENCOUNTER — Ambulatory Visit (HOSPITAL_BASED_OUTPATIENT_CLINIC_OR_DEPARTMENT_OTHER): Payer: Medicare Other

## 2013-07-20 ENCOUNTER — Other Ambulatory Visit: Payer: Self-pay | Admitting: Oncology

## 2013-07-20 ENCOUNTER — Other Ambulatory Visit (HOSPITAL_BASED_OUTPATIENT_CLINIC_OR_DEPARTMENT_OTHER): Payer: Medicare Other

## 2013-07-20 ENCOUNTER — Telehealth: Payer: Self-pay | Admitting: Medical Oncology

## 2013-07-20 VITALS — BP 120/68 | HR 75 | Temp 97.8°F

## 2013-07-20 DIAGNOSIS — R5381 Other malaise: Secondary | ICD-10-CM

## 2013-07-20 DIAGNOSIS — D709 Neutropenia, unspecified: Secondary | ICD-10-CM

## 2013-07-20 DIAGNOSIS — Z86711 Personal history of pulmonary embolism: Secondary | ICD-10-CM

## 2013-07-20 DIAGNOSIS — T451X5A Adverse effect of antineoplastic and immunosuppressive drugs, initial encounter: Secondary | ICD-10-CM

## 2013-07-20 DIAGNOSIS — C8589 Other specified types of non-Hodgkin lymphoma, extranodal and solid organ sites: Secondary | ICD-10-CM | POA: Diagnosis not present

## 2013-07-20 DIAGNOSIS — E46 Unspecified protein-calorie malnutrition: Secondary | ICD-10-CM | POA: Diagnosis not present

## 2013-07-20 DIAGNOSIS — B370 Candidal stomatitis: Secondary | ICD-10-CM

## 2013-07-20 DIAGNOSIS — I2699 Other pulmonary embolism without acute cor pulmonale: Secondary | ICD-10-CM

## 2013-07-20 DIAGNOSIS — E876 Hypokalemia: Secondary | ICD-10-CM | POA: Diagnosis not present

## 2013-07-20 DIAGNOSIS — C8588 Other specified types of non-Hodgkin lymphoma, lymph nodes of multiple sites: Secondary | ICD-10-CM

## 2013-07-20 DIAGNOSIS — D61810 Antineoplastic chemotherapy induced pancytopenia: Secondary | ICD-10-CM | POA: Diagnosis not present

## 2013-07-20 DIAGNOSIS — C8590 Non-Hodgkin lymphoma, unspecified, unspecified site: Secondary | ICD-10-CM

## 2013-07-20 LAB — CBC WITH DIFFERENTIAL/PLATELET
BASO%: 0.4 % (ref 0.0–2.0)
Basophils Absolute: 0 10*3/uL (ref 0.0–0.1)
HCT: 37.1 % (ref 34.8–46.6)
HGB: 12.2 g/dL (ref 11.6–15.9)
LYMPH%: 58.1 % — ABNORMAL HIGH (ref 14.0–49.7)
MCHC: 32.9 g/dL (ref 31.5–36.0)
MCV: 93.5 fL (ref 79.5–101.0)
MONO#: 0.4 10*3/uL (ref 0.1–0.9)
MONO%: 14.6 % — ABNORMAL HIGH (ref 0.0–14.0)
NEUT%: 26.5 % — ABNORMAL LOW (ref 38.4–76.8)
Platelets: 48 10*3/uL — ABNORMAL LOW (ref 145–400)
RBC: 3.97 10*6/uL (ref 3.70–5.45)
WBC: 2.5 10*3/uL — ABNORMAL LOW (ref 3.9–10.3)

## 2013-07-20 LAB — POCT INR: INR: 1.4

## 2013-07-20 LAB — PROTIME-INR: Protime: 16.8 Seconds — ABNORMAL HIGH (ref 10.6–13.4)

## 2013-07-20 MED ORDER — FILGRASTIM 300 MCG/0.5ML IJ SOLN
300.0000 ug | Freq: Once | INTRAMUSCULAR | Status: AC
  Administered 2013-07-20: 300 ug via SUBCUTANEOUS
  Filled 2013-07-20: qty 0.5

## 2013-07-20 MED ORDER — CIPROFLOXACIN HCL 500 MG PO TABS: 500.0000 mg | ORAL_TABLET | Freq: Two times a day (BID) | ORAL | Status: DC

## 2013-07-20 MED ORDER — ONDANSETRON HCL 4 MG PO TABS: 4.0000 mg | ORAL_TABLET | Freq: Four times a day (QID) | ORAL | Status: DC | PRN

## 2013-07-20 NOTE — Progress Notes (Unsigned)
Patient is neutropenic and thrombocytopenic today, even though she received Neulasta after her last treatment.  She is not febrile. She did have a low-grade temperature, just apt 200, over the weekend.  I am starting her on Cipro for the next week. She will receive Neupogen today and for the next 2 days. Where postponing chemotherapy one week and am requesting that she see Dr. Baltazar Apo at that time to discuss.  She will call with any other problems that may develop before her next visit here.

## 2013-07-20 NOTE — Progress Notes (Signed)
INR = 1.4 on Coumadin 2.5 mg/day; 5 mg Mon. No missed doses. Pt is neutropenic today & will not receive chemo today. She is starting Cipro 500 mg BID x 7 days. INR low today.  I will cautiously increase her dose to 2.5 mg/day; 5 mg Mon/Wed since she will be on Cipro this week. Recheck INR next Monday & we'll plan to see her in infusion. Pt knows to call us if she develops sxs of bleeding/unusual bruising. Ebony Hail, Pharm.D., CPP 07/20/2013@9 :06 AM

## 2013-07-20 NOTE — Telephone Encounter (Signed)
Called in cipro and zofran.

## 2013-07-20 NOTE — Telephone Encounter (Signed)
per 12/22 pof from GM (on call) lb/chism/tx 12/29. tx room full 12/29 - 12/31. per desk nurse pt will do lb/chism 12/31 and tx 1/2. per desk nurse 12/26 inj ordered per 12/19 pof to be moved to 1/5 due to delay. s/w pt she is aware and will get new schedule when she comes in tomorrow 12/23

## 2013-07-20 NOTE — Progress Notes (Signed)
Pt had low grade fever sat "100.4 F and felt weak and had dry heaves. Sunday she was very lethargic and nauseous".  Labs reviewed with Dr Darnelle Catalan . Orders received.  Chemo to be held today with f/u 12/29 with Dr Sherrie Sport. I called in cipro to local pharmacy. While pt was in infusion chair she became nauseous. She requested refill on her zofran so I sent to Dr Darnelle Catalan for Berkley Harvey. Pharmacist also instructed pt on coumadin dosing. Pt sent to scheduling for future appts.

## 2013-07-21 ENCOUNTER — Ambulatory Visit (HOSPITAL_BASED_OUTPATIENT_CLINIC_OR_DEPARTMENT_OTHER): Payer: Medicare Other

## 2013-07-21 VITALS — BP 136/72 | HR 78 | Temp 97.9°F

## 2013-07-21 DIAGNOSIS — D702 Other drug-induced agranulocytosis: Secondary | ICD-10-CM

## 2013-07-21 DIAGNOSIS — C8589 Other specified types of non-Hodgkin lymphoma, extranodal and solid organ sites: Secondary | ICD-10-CM

## 2013-07-21 DIAGNOSIS — I2699 Other pulmonary embolism without acute cor pulmonale: Secondary | ICD-10-CM

## 2013-07-21 MED ORDER — FILGRASTIM 300 MCG/0.5ML IJ SOLN
300.0000 ug | Freq: Once | INTRAMUSCULAR | Status: AC
  Administered 2013-07-21: 300 ug via SUBCUTANEOUS
  Filled 2013-07-21: qty 0.5

## 2013-07-22 ENCOUNTER — Ambulatory Visit (HOSPITAL_BASED_OUTPATIENT_CLINIC_OR_DEPARTMENT_OTHER): Payer: Medicare Other

## 2013-07-22 VITALS — BP 149/76 | HR 90 | Temp 99.0°F

## 2013-07-22 DIAGNOSIS — I2699 Other pulmonary embolism without acute cor pulmonale: Secondary | ICD-10-CM

## 2013-07-22 DIAGNOSIS — D702 Other drug-induced agranulocytosis: Secondary | ICD-10-CM | POA: Diagnosis not present

## 2013-07-22 MED ORDER — FILGRASTIM 300 MCG/0.5ML IJ SOLN
300.0000 ug | Freq: Once | INTRAMUSCULAR | Status: AC
  Administered 2013-07-22: 300 ug via SUBCUTANEOUS
  Filled 2013-07-22: qty 0.5

## 2013-07-24 ENCOUNTER — Telehealth: Payer: Self-pay | Admitting: Internal Medicine

## 2013-07-24 DIAGNOSIS — B370 Candidal stomatitis: Secondary | ICD-10-CM

## 2013-07-24 MED ORDER — FLUCONAZOLE 100 MG PO TABS: ORAL_TABLET | ORAL | Status: DC

## 2013-07-24 NOTE — Telephone Encounter (Signed)
I spoke with pt. She has had thrush since starting chemo. Fluconazole was done for ~5 days last month and worked temporarliy, but sx have returned (sore mouth, coated tongue). She denies fevers. I prescribed fluconazole 200mg  x1, then 100mg  daily for a total of 14 days. She is on warfarin and this will likely need to be reduced. She is coming in for an INR check during infusion appointment on Monday (12/19). I recommended dose reductions be handled from that lab value.

## 2013-07-27 ENCOUNTER — Other Ambulatory Visit (HOSPITAL_BASED_OUTPATIENT_CLINIC_OR_DEPARTMENT_OTHER): Payer: Medicare Other

## 2013-07-27 ENCOUNTER — Ambulatory Visit: Payer: Medicare Other | Admitting: Cardiovascular Disease

## 2013-07-27 ENCOUNTER — Other Ambulatory Visit: Payer: Self-pay | Admitting: Medical Oncology

## 2013-07-27 ENCOUNTER — Ambulatory Visit: Payer: Medicare Other | Admitting: Pharmacist

## 2013-07-27 ENCOUNTER — Telehealth: Payer: Self-pay | Admitting: Medical Oncology

## 2013-07-27 DIAGNOSIS — C8588 Other specified types of non-Hodgkin lymphoma, lymph nodes of multiple sites: Secondary | ICD-10-CM | POA: Diagnosis not present

## 2013-07-27 DIAGNOSIS — C8590 Non-Hodgkin lymphoma, unspecified, unspecified site: Secondary | ICD-10-CM

## 2013-07-27 DIAGNOSIS — E876 Hypokalemia: Secondary | ICD-10-CM

## 2013-07-27 DIAGNOSIS — I2699 Other pulmonary embolism without acute cor pulmonale: Secondary | ICD-10-CM | POA: Diagnosis not present

## 2013-07-27 DIAGNOSIS — C8589 Other specified types of non-Hodgkin lymphoma, extranodal and solid organ sites: Secondary | ICD-10-CM | POA: Diagnosis not present

## 2013-07-27 LAB — CBC WITH DIFFERENTIAL/PLATELET
BASO%: 1.1 % (ref 0.0–2.0)
Basophils Absolute: 0 10*3/uL (ref 0.0–0.1)
EOS%: 0.9 % (ref 0.0–7.0)
HCT: 34.6 % — ABNORMAL LOW (ref 34.8–46.6)
HGB: 11.6 g/dL (ref 11.6–15.9)
MCH: 32 pg (ref 25.1–34.0)
MCHC: 33.6 g/dL (ref 31.5–36.0)
MCV: 95.2 fL (ref 79.5–101.0)
MONO%: 16.7 % — ABNORMAL HIGH (ref 0.0–14.0)
NEUT%: 56.3 % (ref 38.4–76.8)
Platelets: 49 10*3/uL — ABNORMAL LOW (ref 145–400)
RDW: 18.2 % — ABNORMAL HIGH (ref 11.2–14.5)

## 2013-07-27 LAB — POCT INR: INR: 4.29

## 2013-07-27 LAB — PROTIME-INR

## 2013-07-27 LAB — COMPREHENSIVE METABOLIC PANEL (CC13)
ALT: 10 U/L (ref 0–55)
AST: 22 U/L (ref 5–34)
Albumin: 3.2 g/dL — ABNORMAL LOW (ref 3.5–5.0)
Alkaline Phosphatase: 56 U/L (ref 40–150)
BUN: 3.5 mg/dL — ABNORMAL LOW (ref 7.0–26.0)
Creatinine: 0.9 mg/dL (ref 0.6–1.1)
Potassium: 3 mEq/L — CL (ref 3.5–5.1)
Total Protein: 5 g/dL — ABNORMAL LOW (ref 6.4–8.3)

## 2013-07-27 LAB — PROTHROMBIN TIME
INR: 4.29 — ABNORMAL HIGH (ref <1.50)
Prothrombin Time: 39.5 seconds — ABNORMAL HIGH (ref 11.6–15.2)

## 2013-07-27 MED ORDER — POTASSIUM CHLORIDE ER 10 MEQ PO TBCR: EXTENDED_RELEASE_TABLET | ORAL | Status: DC

## 2013-07-27 MED ORDER — NYSTATIN 100000 UNIT/ML MT SUSP: 5.0000 mL | Freq: Four times a day (QID) | OROMUCOSAL | Status: DC | PRN

## 2013-07-27 NOTE — Progress Notes (Signed)
No Charge *Telephone Encounter* INR was Send out lab today and pt was called with results INR above goa today at 4.29l likely due to cipro and fluconazole prescriptions Pt already took coumadin this morning Pt is doing well with no complaints No extra doses or missed doses No issues with unusual bleeding or bruising Pt completed 7 day course of cipro but on 07/24/13 was started on fluconazole x 14 days  Pt will need lower dose of coumadin while on fluconazolen (possibly only 2 mg daily or lower) No Vitamin K necessary today as no bleeding noted and INR only 4.29 Plan: Since pt took coumadin this AM Hold coumadin tomorrow and Wednesday morning.  Recheck INR on 07/29/13 with scheduled appointments.  Lab at 9:30am, CC at 9:45am, MD visit with Dr. Rosie Fate at 10am

## 2013-07-27 NOTE — Telephone Encounter (Signed)
I called pt per Dr. Rosie Fate to inform her that her potassium is 3.0. Dr. Rosie Fate would like for her to take potassium 10 meq twice a day for two days then 10 meq daily. Pt voiced understanding. She asked if she can also get a refill on her nystatin. I told her this should not be a problem.

## 2013-07-27 NOTE — Patient Instructions (Signed)
INR above goal likely due to cipro and fluconazole prescriptions Due to already taking your coumadin today Hold coumadin tomorrow and Wednesday morning.  Recheck INR on 07/29/13 with scheduled appointments. Lab at 9:30am, CC at 9:45am, MD visit with Dr. Rosie Fate at 10am You will likely need a lower dose while on 2 week course of fluconazole

## 2013-07-29 ENCOUNTER — Ambulatory Visit (HOSPITAL_BASED_OUTPATIENT_CLINIC_OR_DEPARTMENT_OTHER): Payer: Medicare Other | Admitting: Internal Medicine

## 2013-07-29 ENCOUNTER — Telehealth: Payer: Self-pay | Admitting: *Deleted

## 2013-07-29 ENCOUNTER — Ambulatory Visit (HOSPITAL_BASED_OUTPATIENT_CLINIC_OR_DEPARTMENT_OTHER): Payer: Medicare Other | Admitting: Pharmacist

## 2013-07-29 ENCOUNTER — Other Ambulatory Visit (HOSPITAL_BASED_OUTPATIENT_CLINIC_OR_DEPARTMENT_OTHER): Payer: Medicare Other

## 2013-07-29 ENCOUNTER — Telehealth: Payer: Self-pay | Admitting: Internal Medicine

## 2013-07-29 ENCOUNTER — Encounter: Payer: Self-pay | Admitting: Internal Medicine

## 2013-07-29 VITALS — BP 166/86 | HR 77 | Temp 97.6°F | Resp 18 | Ht 62.0 in | Wt 123.0 lb

## 2013-07-29 DIAGNOSIS — D61810 Antineoplastic chemotherapy induced pancytopenia: Secondary | ICD-10-CM

## 2013-07-29 DIAGNOSIS — K7689 Other specified diseases of liver: Secondary | ICD-10-CM | POA: Diagnosis not present

## 2013-07-29 DIAGNOSIS — I5189 Other ill-defined heart diseases: Secondary | ICD-10-CM

## 2013-07-29 DIAGNOSIS — D638 Anemia in other chronic diseases classified elsewhere: Secondary | ICD-10-CM

## 2013-07-29 DIAGNOSIS — C8589 Other specified types of non-Hodgkin lymphoma, extranodal and solid organ sites: Secondary | ICD-10-CM | POA: Diagnosis not present

## 2013-07-29 DIAGNOSIS — R1310 Dysphagia, unspecified: Secondary | ICD-10-CM | POA: Diagnosis not present

## 2013-07-29 DIAGNOSIS — I519 Heart disease, unspecified: Secondary | ICD-10-CM

## 2013-07-29 DIAGNOSIS — K760 Fatty (change of) liver, not elsewhere classified: Secondary | ICD-10-CM

## 2013-07-29 DIAGNOSIS — B370 Candidal stomatitis: Secondary | ICD-10-CM | POA: Diagnosis not present

## 2013-07-29 DIAGNOSIS — C8590 Non-Hodgkin lymphoma, unspecified, unspecified site: Secondary | ICD-10-CM

## 2013-07-29 DIAGNOSIS — E876 Hypokalemia: Secondary | ICD-10-CM

## 2013-07-29 DIAGNOSIS — Z86711 Personal history of pulmonary embolism: Secondary | ICD-10-CM

## 2013-07-29 DIAGNOSIS — K209 Esophagitis, unspecified without bleeding: Secondary | ICD-10-CM | POA: Diagnosis not present

## 2013-07-29 DIAGNOSIS — I2699 Other pulmonary embolism without acute cor pulmonale: Secondary | ICD-10-CM

## 2013-07-29 LAB — CBC WITH DIFFERENTIAL/PLATELET
BASO%: 0.4 % (ref 0.0–2.0)
EOS%: 0.6 % (ref 0.0–7.0)
HCT: 34.8 % (ref 34.8–46.6)
HGB: 11.7 g/dL (ref 11.6–15.9)
MCH: 31 pg (ref 25.1–34.0)
MCHC: 33.6 g/dL (ref 31.5–36.0)
MONO#: 0.3 10*3/uL (ref 0.1–0.9)
NEUT%: 63.9 % (ref 38.4–76.8)
RBC: 3.78 10*6/uL (ref 3.70–5.45)
RDW: 16.7 % — ABNORMAL HIGH (ref 11.2–14.5)
WBC: 4.8 10*3/uL (ref 3.9–10.3)
lymph#: 1.3 10*3/uL (ref 0.9–3.3)
nRBC: 0 % (ref 0–0)

## 2013-07-29 LAB — PROTIME-INR: INR: 4.8 — ABNORMAL HIGH (ref 2.00–3.50)

## 2013-07-29 NOTE — Patient Instructions (Signed)
Hold coumadin today (07/29/13), tomorrow (07/30/13)  and Friday morning (07/31/13).  Recheck INR on 07/31/13 with treatment. Lab at 10:15am, Treatment at 10:30am and Coumadin Clinic at 10:45am.

## 2013-07-29 NOTE — Telephone Encounter (Signed)
I discussed with Dr. Tresa Endo that her latest echocardiogram was done and he agreed with holding doxorubicin due to the extent of decrease.   Patient was then called and made aware of the discussion.

## 2013-07-29 NOTE — Telephone Encounter (Signed)
Per staff message and POF I have scheduled appts. I have also talked with the desk RN and treamtent is 7hrs on day 1 and injection day 4.  JMW

## 2013-07-29 NOTE — Progress Notes (Signed)
INR remains above goal today. No changes to report. Pt has 3 days of Cipro remaining and ~ 10 days of Fluconazole remaining. No unusual bruising or bleeding. No other problems to report. Appetite is fair - she has been eating more than usual. No changes in the types of foods. Hold coumadin today (07/29/13), tomorrow (07/30/13)  and Friday morning (07/31/13).  Recheck INR on 07/31/13 with treatment. Lab at 10:15am, Treatment at 10:30am and Coumadin Clinic at 10:45am.

## 2013-07-29 NOTE — Telephone Encounter (Signed)
appts made and printed. Pt is aware that tx will be added. i emailed MW to add the tx...td 

## 2013-07-29 NOTE — Patient Instructions (Signed)
Fluconazole  What is this medicine? FLUCONAZOLE (floo KON na zole) is an antifungal medicine. It is used to treat or prevent certain kinds of fungal or yeast infections. This medicine may be used for other purposes; ask your health care provider or pharmacist if you have questions. COMMON BRAND NAME(S): Diflucan What should I tell my health care provider before I take this medicine? They need to know if you have any of these conditions: -history of irregular heart beat -kidney disease -an unusual or allergic reaction to fluconazole, other antifungal medicines, foods, dyes or preservatives -pregnant or trying to get pregnant -breast-feeding How should I use this medicine? This medicine is for injection into a vein. It is usually given by a health care professional in a hospital or clinic setting. If you get this medicine at home, you will be taught how to prepare and give this medicine. Use exactly as directed. Take your medicine at regular intervals. Do not take your medicine more often than directed. It is important that you put your used needles and syringes in a special sharps container. Do not put them in a trash can. If you do not have a sharps container, call your pharmacist or healthcare provider to get one. Talk to your pediatrician regarding the use of this medicine in children. Special care may be needed. Overdosage: If you think you have taken too much of this medicine contact a poison control center or emergency room at once. NOTE: This medicine is only for you. Do not share this medicine with others. What if I miss a dose? This does not apply. What may interact with this medicine? Do not take this medicine with any of the following medications: -cisapride -pimozide -red yeast rice This medicine may also interact with the following medications: -birth control pills -cyclosporine -diuretics like hydrochlorothiazide -medicines for diabetes that are taken by mouth -medicines  for high cholesterol like atorvastatin, lovastatin or simvastatin -phenytoin -ramelteon -rifabutin -rifampin -some medicines for anxiety or sleep -tacrolimus -terfenadine -theophylline -tofacitinib -warfarin This list may not describe all possible interactions. Give your health care provider a list of all the medicines, herbs, non-prescription drugs, or dietary supplements you use. Also tell them if you smoke, drink alcohol, or use illegal drugs. Some items may interact with your medicine. What should I watch for while using this medicine? Tell your doctor if your symptoms do not improve. If you are taking this medicine for a long time you may need blood work. Some fungal infections need many weeks or months of treatment to cure completely. Alcohol can increase possible damage to your liver from this medicine. Avoid alcoholic drinks. What side effects may I notice from receiving this medicine? Side effects that you should report to your doctor or health care professional as soon as possible: -allergic reactions like skin rash or itching, hives, swelling of the lips, mouth, tongue, or throat -dark urine -feeling dizzy or faint -irregular heartbeat or chest pain -pain, redness at site of injection -redness, blistering, peeling or loosening of the skin, including inside the mouth -stomach pain -trouble breathing -unusual bruising or bleeding -vomiting -yellowing of the eyes or skin Side effects that usually do not require medical attention (report to your doctor or health care professional if they continue or are bothersome): -changes in how food tastes -diarrhea -headache -stomach upset, nausea This list may not describe all possible side effects. Call your doctor for medical advice about side effects. You may report side effects to FDA at 1-800-FDA-1088. Where should  I keep my medicine? Keep out of the reach of children. If you are using this medicine at home, you will be  instructed on how to store this medicine. Throw away any unused medicine after the expiration date on the label. NOTE: This sheet is a summary. It may not cover all possible information. If you have questions about this medicine, talk to your doctor, pharmacist, or health care provider.  2014, Elsevier/Gold Standard. (2012-04-15 15:12:39) Warfarin tablets What is this medicine? WARFARIN (WAR far in) is an anticoagulant. It is used to treat or prevent clots in the veins, arteries, lungs, or heart. This medicine may be used for other purposes; ask your health care provider or pharmacist if you have questions. COMMON BRAND NAME(S): Coumadin, Jantoven  What should I tell my health care provider before I take this medicine? They need to know if you have any of these conditions: -alcoholism -anemia -bleeding disorders -cancer -diabetes -heart disease -high blood pressure -history of bleeding in the gastrointestinal tract -history of stroke or other brain injury or disease -kidney or liver disease -protein C deficiency -protein S deficiency -psychosis or dementia -recent injury, recent or planned surgery or procedure -an unusual or allergic reaction to warfarin, other medicines, foods, dyes, or preservatives -pregnant or trying to get pregnant -breast-feeding How should I use this medicine? Take this medicine by mouth with a glass of water. Follow the directions on the prescription label. You can take this medicine with or without food. Take your medicine at the same time each day. Do not take it more often than directed. Do not stop taking except on your doctor's advice. Stopping this medicine may increase your risk of a blood clot. Be sure to refill your prescription before you run out of medicine. If your doctor or healthcare professional calls to change your dose, write down the dose and any other instructions. Always read the dose and instructions back to him or her to make sure you  understand them. Tell your doctor or healthcare professional what strength of tablets you have on hand. Ask how many tablets you should take to equal your new dose. Write the date on the new instructions and keep them near your medicine. If you are told to stop taking your medicine until your next blood test, call your doctor or healthcare professional if you do not hear anything within 24 hours of the test to find out your new dose or when to restart your prior dose. A special MedGuide will be given to you by the pharmacist with each prescription and refill. Be sure to read this information carefully each time. Talk to your pediatrician regarding the use of this medicine in children. Special care may be needed. Overdosage: If you think you have taken too much of this medicine contact a poison control center or emergency room at once. NOTE: This medicine is only for you. Do not share this medicine with others. What if I miss a dose? It is important not to miss a dose. If you miss a dose, call your healthcare provider. Take the dose as soon as possible on the same day. If it is almost time for your next dose, take only that dose. Do not take double or extra doses to make up for a missed dose. What may interact with this medicine? Do not take this medicine with any of the following medications: -agents that prevent or dissolve blood clots -aspirin or other salicylates -danshen -dextrothyroxine -mifepristone -St. John's Wort -red yeast rice  This medicine may also interact with the following medications: -acetaminophen -agents that lower cholesterol -alcohol -allopurinol -amiodarone -antibiotics or medicines for treating bacterial, fungal or viral infections -azathioprine -barbiturate medicines for inducing sleep or treating seizures -certain medicines for diabetes -certain medicines for heart rhythm problems -certain medicines for high blood pressure -chloral  hydrate -cisapride -disulfiram -female hormones, including contraceptive or birth control pills -general anesthetics -herbal or dietary products like garlic, ginkgo, ginseng, green tea, or kava kava -influenza virus vaccine -female hormones -medicines for mental depression or psychosis -medicines for some types of cancer -medicines for stomach problems -methylphenidate -NSAIDs, medicines for pain and inflammation, like ibuprofen or naproxen -propoxyphene -quinidine, quinine -raloxifene -seizure or epilepsy medicine like carbamazepine, phenytoin, and valproic acid -steroids like cortisone and prednisone -tamoxifen -thyroid medicine -tramadol -vitamin c, vitamin e, and vitamin K -zafirlukast -zileuton This list may not describe all possible interactions. Give your health care provider a list of all the medicines, herbs, non-prescription drugs, or dietary supplements you use. Also tell them if you smoke, drink alcohol, or use illegal drugs. Some items may interact with your medicine. What should I watch for while using this medicine? Visit your doctor or health care professional for regular checks on your progress. You will need to have a blood test called a PT/INR regularly. The PT/INR blood test is done to make sure you are getting the right dose of this medicine. It is important to not miss your appointment for the blood tests. When you first start taking this medicine, these tests are done often. Once the correct dose is determined and you take your medicine properly, these tests can be done less often. Notify your doctor or health care professional and seek emergency treatment if you develop breathing problems; changes in vision; chest pain; severe, sudden headache; pain, swelling, warmth in the leg; trouble speaking; sudden numbness or weakness of the face, arm or leg. These can be signs that your condition has gotten worse. While you are taking this medicine, carry an identification  card with your name, the name and dose of medicine(s) being used, and the name and phone number of your doctor or health care professional or person to contact in an emergency. Do not start taking or stop taking any medicines or over-the-counter medicines except on the advice of your doctor or health care professional. You should discuss your diet with your doctor or health care professional. Do not make major changes in your diet. Vitamin K can affect how well this medicine works. Many foods contain vitamin K. It is important to eat a consistent amount of foods with vitamin K. Other foods with vitamin K that you should eat in consistent amounts are asparagus, basil, beef or pork liver, black eyed peas, broccoli, brussel sprouts, cabbage, chickpeas, cucumber with peel, green onions, green tea, okra, parsley, peas, thyme, and green leafy vegetables like beet greens, collard greens, endive, kale, mustard greens, spinach, turnip greens, watercress, or certain lettuces like green leaf or romaine. This medicine can cause birth defects or bleeding in an unborn child. Women of childbearing age should use effective birth control while taking this medicine. If a woman becomes pregnant while taking this medicine, she should discuss the potential risks and her options with her health care professional. Avoid sports and activities that might cause injury while you are using this medicine. Severe falls or injuries can cause unseen bleeding. Be careful when using sharp tools or knives. Consider using an Neurosurgeon. Take special care brushing or  flossing your teeth. Report any injuries, bruising, or red spots on the skin to your doctor or health care professional. If you have an illness that causes vomiting, diarrhea, or fever for more than a few days, contact your doctor. Also check with your doctor if you are unable to eat for several days. These problems can change the effect of this medicine. Even after you stop  taking this medicine, it takes several days before your body recovers its normal ability to clot blood. Ask your doctor or health care professional how long you need to be careful. If you are going to have surgery or dental work, tell your doctor or health care professional that you have been taking this medicine. What side effects may I notice from receiving this medicine? Side effects that you should report to your doctor or health care professional as soon as possible: -back pain -chills -dizziness -fever -heavy menstrual bleeding or vaginal bleeding -painful, blue, or purple toes -painful, prolonged erection -signs and symptoms of bleeding such as bloody or black, tarry stools; red or dark-brown urine; spitting up blood or brown material that looks like coffee grounds; red spots on the skin; unusual bruising or bleeding from the eye, gums, or nose-skin rash, itching or skin damage -stomach pain -unusually weak or tired -yellowing of skin or eyes Side effects that usually do not require medical attention (report to your doctor or health care professional if they continue or are bothersome): -diarrhea -hair loss This list may not describe all possible side effects. Call your doctor for medical advice about side effects. You may report side effects to FDA at 1-800-FDA-1088. Where should I keep my medicine? Keep out of the reach of children. Store at room temperature between 15 and 30 degrees C (59 and 86 degrees F). Protect from light. Throw away any unused medicine after the expiration date. Do not flush down the toilet. NOTE: This sheet is a summary. It may not cover all possible information. If you have questions about this medicine, talk to your doctor, pharmacist, or health care provider.  2014, Elsevier/Gold Standard. (2013-02-04 12:17:56)

## 2013-07-30 DIAGNOSIS — I639 Cerebral infarction, unspecified: Secondary | ICD-10-CM

## 2013-07-30 HISTORY — DX: Cerebral infarction, unspecified: I63.9

## 2013-07-30 NOTE — Progress Notes (Signed)
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 Lymphoma   CURRENT THERAPY: R miniCHOP x  1, given on 09/20; R-Mini CHO cycle #2 given on 06/01/13 followed by neulasta 6 mg SQ on Day #2. R-Mini CHO cycle #3 given on 06/22/13 followed by neulasta 6 mg SQ on Day #2.  She had prolonged neutropenia and given neupogen x 3 days on 12/22.    She had psychosis, memory lost, hyperglycemia secondary to prednisone following her first cycle. Planning to complete 6 to 8 cycles.   INTERVAL HISTORY: Tonya Wise 73 y.o. 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   She was last seen by me on 07/03/2013.   Today, she is accompanied by her husband [name].  She was seen and evaluated by Dr. Claiborne Billings based on tachycardia and EKG changes and had a myocardial nuclear scan revealing an EF of 42%. In addition, her pro-BNP was significantly elevated. She had an Echocardiogram demonstrating 35-40%.    As previously noted, she  had an interim PET/CT following 2 cycles which showed a significant response to therapy (as outlined below).  In addition, it showed a new pulmonary embolus.   She was started on lovenox and coumadin bridge.  She was started on low dose ace inhibitor.     She complains mostly of dysphagia today.  She states dysphagia to mainly solids with feelings of food being stucked.  As a result, he eating has decreased.  She takes protonix.   She reports some hair lost and taste changes.  She states that she cannot eat "sweets" anymore because of the taste.  She denies fevers or chills or night sweats.  She reports she had a nice Christmas holiday and enjoyed spending time with family. She was also started on fluconazole for thrush to complete a 15 day course and cipro 500 mg bid.   MEDICAL HISTORY: Past Medical History  Diagnosis Date  . Hypertension   . Renal insufficiency   . Osteopenia   . Allergy   . Heart murmur   . GERD (gastroesophageal reflux disease)   . Anemia   . Hypercalcemia 03/27/2013  . Non-Hodgkins lymphoma 04/06/2013  . 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    ALLERGIES:  is allergic to penicillins.  MEDICATIONS: has a current medication list which includes the following prescription(s): albuterol, allopurinol, aspirin, bisoprolol, ciprofloxacin, dextromethorphan,  fluconazole, lidocaine-prilocaine, lisinopril, nystatin, omeprazole, ondansetron, oxycodone, potassium chloride, valacyclovir, and warfarin.  SURGICAL HISTORY:  Past  Surgical History  Procedure Laterality Date  . Appendectomy    . Colonoscopy  2008  . Spine surgery    . Back surgery      LOWER BACK TWICE  . Abdominal hysterectomy    . Esophagogastroduodenoscopy N/A 03/29/2013    Procedure: ESOPHAGOGASTRODUODENOSCOPY (EGD);  Surgeon: Juanita Craver, MD;  Location: [facility] ENDOSCOPY;  Service: Endoscopy;  Laterality: N/A;  . Inguinal lymph node biopsy Right 03/31/2013    Procedure: INGUINAL LYMPH NODE BIOPSY;  Surgeon: Gwenyth Ober, MD;  Location: [facility];  Service: General;  Laterality: Right;   ONCOLOGY HISTORY: She had a prior hospitalization from September 12 - 26 2014 secondary to hypercalcemia, renal failure and dehydration. During this period, the patient's clinical course was complicated by pneumonia and she was treated with intravenous antibiotics and the development of lethargy and confusion which resolved over the clinical course. She was readmitted from home on 04/25/2013 for worsening mouth sores/oral thrush, odynophagia, failure to thrive. Patient was started on high-dose acyclovir for empiric herpetic encephalitis and/or HSV esophagitis.  She was discharged to [facility] on 10/01 and continue physical rehabilitation and medical provided by Dr. Gwynneth Munson.  She was discharged from the [facility] on 10/21 and now is at home.    On initial evaluation, she was sent by her PCP to [facility] Emergency department because of anemia (Hgb 6.4) on August 29th, 2014. She reported that she started to lose her appetite and started to lose weight about 2 months ago. She became increasingly weak and fatigued. In addition, she also developed cough with green sputum and dyspnea. Patient also noticed new headaches, diminished hearing. She also complained fevers (persistent) and nightsweats that at times drenched her bedsheets. She reported nasal congestion and white nasal discharge, feeling some lump in throat. On admission, she also complained of abdominal pain, episodes of  nausea, low back pain. Patient reported history of colonoscopy and EGD several years ago. She received 3 units of RBC. GI consult was done. Patient was admitted and transfused a total of 3 units of PRBC. GI was consulted and an EGD done on 8/31 did not show any foci for bleeding. CT scan of the abdomen and chest, showed significant lymphadenopathy, suspicious of lymphoma. Hematology/oncology was also consulted and recommended core needle biopsy. Surgery performed a biopsy of R inguinal lymph node revealed Diffuse large B cell lymphoma.   REVIEW OF SYSTEMS:   Constitutional: Denies fevers, chills or abnormal weight loss Eyes: Denies blurriness of vision Ears, nose, mouth, throat, and face: +  mucositis but overall improvement with some discoloration on the surface of her tongue but she denies sore throat; positive dysphagia to solids.  Respiratory: Denies cough, dyspnea or wheezes Cardiovascular: Denies palpitation, chest discomfort; positive for lower extremity swelling Gastrointestinal:  Denies nausea, heartburn or change in bowel habits Skin: Denies abnormal skin rashes Lymphatics: Denies new lymphadenopathy or easy bruising Neurological:Denies numbness, tingling or new weaknesses Behavioral/Psych: Mood is stable, no new changes  All other systems were reviewed with the patient and are negative.  PHYSICAL EXAMINATION: ECOG PERFORMANCE STATUS: 1 - Symptomatic but completely ambulatory  Blood pressure 166/86, pulse 77, temperature 97.6 F (36.4 C), temperature source Oral, resp. rate 18, height 5\' 2"  (1.575 m), weight 123 lb (55.792 kg), SpO2 99.00%.  GENERAL:alert, no distress and comfortable; ambulates by cane but required assistance to the table.  SKIN: skin color,  texture, turgor are normal, no rashes or significant lesions; + R sided Port a cath EYES: normal, Conjunctiva are pink and non-injected, sclera clear  OROPHARYNX:no exudate, no erythema and lips, buccal mucosa, and tongue with a  grayish hue but without bleeding and minimal swelling (slightly worse since last visit).  No lip lesions identified. OP clear.  NECK: supple, thyroid normal size, non-tender, without nodularity LYMPH:  no palpable lymphadenopathy in the cervical, axillary or supraclavicular; could not palpate axillary lymph on the right.  LUNGS: clear to auscultation and percussion with normal breathing effort HEART: regular rate & rhythm and no murmurs and no lower extremity edema  ABDOMEN:abdomen soft, non-tender and normal bowel sounds Musculoskeletal:no cyanosis of digits and no clubbing  NEURO: alert & oriented x 3 with fluent speech, no focal motor/sensory deficits  LABORATORY DATA: Results for orders placed in visit on 07/29/13 (from the past 48 hour(s))  POCT INR     Status: None   Collection Time    07/29/13 12:00 AM      Result Value Range   INR 4.8      Labs:  Lab Results  Component Value Date   WBC 4.8 07/29/2013   HGB 11.7 07/29/2013   HCT 34.8 07/29/2013   MCV 92.1 07/29/2013   PLT 51* 07/29/2013   NEUTROABS 3.1 07/29/2013      Chemistry      Component Value Date/Time   NA 140 07/27/2013 0836   NA 139 07/17/2013 0834   K 3.0* 07/27/2013 0836   K 3.3* 07/17/2013 0834   CL 102 07/17/2013 0834   CO2 25 07/27/2013 0836   CO2 24 07/17/2013 0834   BUN 3.5* 07/27/2013 0836   BUN 6 07/17/2013 0834   CREATININE 0.9 07/27/2013 0836   CREATININE 0.85 07/17/2013 0834   CREATININE 0.96 04/29/2013 0500      Component Value Date/Time   CALCIUM 8.3* 07/27/2013 0836   CALCIUM 8.5 07/17/2013 0834   ALKPHOS 56 07/27/2013 0836   ALKPHOS 53 07/17/2013 0834   AST 22 07/27/2013 0836   AST 21 07/17/2013 0834   ALT 10 07/27/2013 0836   ALT 8 07/17/2013 0834   BILITOT 0.34 07/27/2013 0836   BILITOT 0.5 07/17/2013 0834     Basic Metabolic Panel:  Recent Labs Lab 07/27/13 0836  NA 140  K 3.0*  CO2 25  GLUCOSE 88  BUN 3.5*  CREATININE 0.9  CALCIUM 8.3*   GFR Estimated Creatinine  Clearance: 44.7 ml/min (by C-G formula based on Cr of 0.9). Liver Function Tests:  Recent Labs Lab 07/27/13 0836  AST 22  ALT 10  ALKPHOS 56  BILITOT 0.34  PROT 5.0*  ALBUMIN 3.2*   CBC:  Recent Labs Lab 07/27/13 0836 07/29/13 0933  WBC 3.9 4.8  NEUTROABS 2.2 3.1  HGB 11.6 11.7  HCT 34.6* 34.8  MCV 95.2 92.1  PLT 49* 51*   Results for COMFORT, IVERSEN (MRN [id number]) as of 07/30/2013 19:25  Ref. Range 07/29/2013 00:00  INR Latest Range: <1.50  4.8    Results for BETHANEE, REDONDO (MRN [id number]) as of 04/17/2013 16:25   Ref. Range  04/16/2013 18:12   Hepatitis B Surface Ag  Latest Range: NEGATIVE  NEGATIVE   Hep B S Ab  Latest Range: NEGATIVE  NEGATIVE   Hep B Core Total Ab  Latest Range: NEGATIVE  NEGATIVE     Studies:  Bone Marrow c/w with DLBCL per preliminary report. Await final report.  Pathology (03/31/2013):  Lymph node for lymphoma, Right, Inguinal  - DIFFUSE LARGE B CELL LYMPHOMA  - SEE ONCOLOGY TABLE.  Microscopic Comment  LYMPHOMA  Histologic type: Non-Hodgkin's lymphoma, diffuse large cell type. Grade (if applicable): High grade.  Flow cytometry: A minor monoclonal B cell population with lambda light chain restriction. ([id number]).  Immunohistochemical stains: BCL-2, BCL-6, CD10, CD138, CD20, CD3, CD30, LCA, CD43, and CD79a with appropriate controls.  Touch preps/imprints: Abundance of large lymphoid cells with prominent nucleoli admixed with small lymphoid cells.  Comments: The sections show effacement of the lymph nodal architecture by a diffuse relatively monomorphicpopulation of large lymphoid cells with vesicular chromatin, prominent nucleoli and eosinophilic to amphophilic cytoplasm. This is associated with brisk mitoses and areas of tumor necrosis. The appearance is mostly diffuse with lack of follicular of nodular structures. Flow cytometric analysis was performed ([id number]) and shows a minor population of monoclonal B cells displaying pan B cell antigens  including CD20.  Immunohistochemical stains were performed and show that the large atypical lymphoid cells are positive for LCA, CD79a, CD20, BCL-2 and BCL-6. There is partial positivity for CD30. No appreciable positivity is seen with CD10 or CD138. CD3 and CD43 highlight the admixed T cell component present in the background. The overall histologic and immunophenotypic features are consistent with diffuse large B cell lymphoma.  Specimen Gross and Clinical Information  Specimen(s) Obtained:  Lymph node for lymphoma, Right, Inguinal  1 of 2  FINAL for Tonya Wise, Tonya Wise ([id number])  Specimen Clinical Information  Right Inguinal Adenopathy (jmc)  Gross  Received fresh is a 3.4 x 2.8 x 2.5 cm encapsulated, rubbery, ovoid nodule. There is a separate, focally  disrupted 2.5 x 2.2 x 1.2 cm nodular portion of tissue. The cut surfaces are solid, tan-white with focal slight  hemorrhage. A portion of the specimen is placed in RPMI for flow cytometry and touch preparations are made  from the cut surfaces. Sections are submitted in three cassettes. (GRP:ecj 03/31/2013)  Stain(s) used in Diagnosis:  The following stain(s) were used in diagnosing the case: BCl 6 , CD 79a, CD 30, BCl 2, CD 138, CD 3, CD 43,  CD45 (LCA), CD-10, CD 20. The control(s) stained appropriately.   RADIOGRAPHIC STUDIES: 06/16/2013 NUCLEAR MEDICINE PET SKULL BASE TO THIGH FASTING BLOOD GLUCOSE: Value: 105mg /dl TECHNIQUE: 19.0 mCi F-18 FDG was injected intravenously. CT data was obtained  and used for attenuation correction and anatomic localization only. (This was not acquired as a diagnostic CT examination.) Additional exam technical data entered on technologist worksheet.  COMPARISON: CT scan 03/30/2013. FINDINGS: NECK No hypermetabolic lymph nodes in the neck.  CHEST There is a single right axillary lymph node measuring 16 x 10 mm in demonstrates FDG uptake with SUV max of 5.5. This would suggest some residual metabolically active  lymphoma. The mediastinal and hilar lymph nodes are much smaller than a previous chest CT and I do not see any definite metabolic activity. No worrisome pulmonary lesions. The right middle lobe subpleural density is relatively stable and is likely scarring or atelectasis. No abnormal FDG uptake is identified. There is a small right pleural effusion.  ABDOMEN/PELVIS No persistence/residual lymphadenopathy in the abdomen. A few small scattered lymph nodes are noted but the bulky adenopathy has resolved. No metabolic activity is identified in the remaining small lymph nodes. Stable mild splenomegaly with numerous small lesions but no hyper metabolism. SKELETON Diffuse osseous uptake is likely due to rebound marrow activity or marrow stimulating drugs. IMPRESSION: 1. Single metabolically active 16 x  10 mm in the right axilla. 2. Persistent but definitely smaller mediastinal and hilar lymph node without definite metabolic activity. 3. Resolution of abdominal adenopathy with some scattered residual  lymph nodes but no metabolic activity. 4. Persistent mild splenomegaly and small splenic lesions without obvious hyper metabolism.   06/16/2013. CT CHEST, ABDOMEN, AND PELVIS WITH CONTRAST TECHNIQUE: Multidetector CT imaging of the chest, abdomen and pelvis was performed following the standard protocol during bolus  administration of intravenous contrast. CONTRAST: 156mL OMNIPAQUE IOHEXOL 300 MG/ML SOLN  COMPARISON: 03/30/2013. FINDINGS: CT CHEST FINDINGS The chest wall demonstrates a right-sided Port-A-Cath in good position without complicating features. Bilateral breast prosthesis  are noted. The bulky bilateral axillary lymphadenopathy has resolved. There is a small residual right axillary node on image number 12 which measures 16 x 12 mm. It previously measured 27 x 20  mm. It did show slight hypermetabolism on the PET scan. Other normal size axillary lymph nodes did not show any hyper metabolism. The bony thorax is  intact. No destructive bone lesions or spinal  canal compromise. The heart is normal in size. No pericardial effusion. No mediastinal or hilar mass. The bulky lymphadenopathy seen on the prior study has significantly decreased in size. The subcarinal nodal mass previously measured 4.0 x 2.2 cm and now measures 2.0 x 1.3 cm. The aorta is normal in caliber. Stable atherosclerotic calcifications. There is clot noted in the left lower lobe pulmonary artery  consistent with pulmonary embolism. There is also a small amount of string like adherent clot in the main left pulmonary artery. Examination of the lung parenchyma demonstrates no worrisome  pulmonary nodules. Stable area of subpleural scarring type changes in the right middle lobe. There is a small right pleural effusion with overlying atelectasis. CT ABDOMEN AND PELVIS FINDINGS The liver demonstrates diffuse fatty infiltration but no focal hepatic lesions or biliary dilatation. The gallbladder is normal. No common bowel duct dilatation. The pancreas is normal. The spleen remains mildly enlarged and there are multiple low-attenuation lesions. The adrenal glands and kidneys are unremarkable and unchanged. The stomach, duodenum, small bowel and colon are unremarkable. No inflammatory changes, mass lesions or obstructive findings. There are small scattered mesenteric and retroperitoneal lymph nodes. This reflects a significant improvement since the prior CT scan where there was bulky adenopathy. The aorta and branch vessels are patent. Stable atherosclerotic calcifications. The uterus and ovaries are unremarkable and stable. The bladder is normal. No pelvic mass, adenopathy or free pelvic fluid collections. No inguinal mass or adenopathy. The borderline inguinal lymph nodes have resolved. No significant bony findings. Stable lumbar fusion changes.  IMPRESSION: 1. Significant interval reduction in axillary lymphadenopathy. There is 1 small residual right axillary  lymph node measuring 16 x 12 mm which did show FDG uptake. 2. Significant reduction in mediastinal and hilar lymphadenopathy. 3. Left-sided pulmonary embolism. 4. Resolution of bulky abdominal lymphadenopathy. 5. Interval decrease in size of the spleen (15 x 14 x 10.5 versus 12  x 12 x 9.5). Persistent small lesions.   ASSESSMENT: Tonya Wise 73 y.o. 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 DLBCL, high grade. stage IV with bone marrow involvement and B symptoms (fevers and nightsweats).  --Her International Prognostic index is 4 (NEJM, 1993) with a 3-year survival rate of 59% based on an elevated LDH, age greater than 46 years old, performance status of 2, Stage IV. She has extranodal involvement in the spleen and in the bone marrow.  --On prior visits, we had a detailed discussion the patient and her family starting R-miniCHOP (Peyrade F, [employer]) chemotherapy including the benefits of possibly controlling her disease and the side-effects of therapy including risks of bone marrow toxicity including but not limited to myelosuppression which can lead to the development of life-threatening infections, nausea/vomiting, diarrhea, ananaphylaxis, neuropathy, cystitis, flu-like symptoms. She understood these risk and agreed to proceed with chemotherapy.   --We held R-mini CHO (excluding the prednisone at her request due to altered mental status changes while on prednisone and worsening  hypergycemia) pending repeat echocardiagram to determine extent of cardiomyopathy and in consideration of changing therapy to RCEOP instead of RCHOP.  It revealed an EF of 35-40%.  We discussed this with her cardiologist and he was agreeable with changing the the doxorubicin.  We will start etoposide instead--R-mini CEO.  However, given her plts are 51 and supra therapeutic INR, we will HOLD CHEMOTHERAPY FOR ONE WEEK to allow additional time for recovery.   --In discussion with pharmacy, initially we choose R-miniCHO based on her poor nutritional reserve and prolonged recovery during her 1st cycle. We discussed the risks associated with smaller dose includes less effective treatment potentially but may allow her more recovery.  We will reassess her physical state and discuss increasing her R-CHO based on continued recovery.    --She will continue prophylaxis with allopurinol  300 mg daily. She will start anti-emetics with zofran 8 mg two times daily starting the day after chemo for 3 days then two times a day prn for nausea or vomiting.   -- Her restaging scans on (06/16/2013) demonstrated significant response as noted above. PET showed a single metabolically active 16 x 10 mm in the right axilla; persistent but definitely smaller mediastinal and hilar lymph node without definite metabolic activity; resolution of abdominal adenopathy with some scattered residual lymph nodes but no metabolic activity; persistent mild splenomegaly and small splenic lesions without obvious hyper metabolism.   2. Cardiomyopathy NOS --She had baseline diastolic dysfunction. Echocardiogram consistent with  significant changes from baseline and so we will discontinue doxorubicin for etoposide.  Cardiology is aware.   3. 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 -- Her counts have nearly recovered.  She is no longer neutropenic with an ANC of 3.1 but her plts are 50,000.  We will transfuse pRBCs based on symptoms and to maintain a  hemoglobin greater than 8.   She denies any recent infections.  Her hemoglobin is 11.7 today.  She is on cipro bid and fluconazole and acyclovir.  She denies any bleeding and is being followed by anticoagulation clinic.    4. 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  She is coumdain 5 mg daily.  Her INR is supratherapeutic today at 4.8.   She is being followed by anti-coagulation clinic. Coumadin education was reviewed including indication, INR testing, S/S of bleeding, Rx and OTC drug interaction, herbal medications, Vit K containing foods and EtOH intake.  She is aware of interactions of diflucan and warfarin. Given significant drop in plts,  bleeding risk is a concern.  Patient counseled to report any signs of bleeding.   5. History of thrush/ questionable HSV esophagitis on prior hospitalization --She is on diflucan presently.   She remains on valtrex 500 mg daily for viral prophylaxis.     6. Poor nutritional, hypokalemia.  --Continue to supplement diet with Ensure complete 3 times daily with meals.  --Provided potassium chloride 10 meQ bid for four days.  Will continue potassium chloride 10 mEQ daily.    7. Deconditioning secondary to chronic illness and prior hospitalization. --Continue physical therapy as tolerated.  Patient requested an extension for additional core strengthening exercises.   8. Dyphagia. --Speech and swallowing evaluation and will determine for evaluation is warranted by GI.  9. Follow-up.   --Patient will follow-up for an office visit with CBC, CMP, LDH in consideration for Cycle #5 of R-mini-CEO in one month.  Labs in one week and if plts recovered proceed with cycle #4 of R-mini-CEO.   All questions were answered. The patient knows to call the clinic with any problems, questions or concerns. We can certainly see the patient much sooner if necessary.   I spent 15 minutes counseling the  patient face to face. The total time spent in the appointment was 25 minutes.    Arletha Marschke, MD 07/30/2013 7:22 PM

## 2013-07-31 ENCOUNTER — Ambulatory Visit: Payer: Medicare Other

## 2013-07-31 ENCOUNTER — Ambulatory Visit (HOSPITAL_BASED_OUTPATIENT_CLINIC_OR_DEPARTMENT_OTHER): Payer: Medicare Other | Admitting: Pharmacist

## 2013-07-31 ENCOUNTER — Other Ambulatory Visit (HOSPITAL_BASED_OUTPATIENT_CLINIC_OR_DEPARTMENT_OTHER): Payer: Medicare Other

## 2013-07-31 DIAGNOSIS — I2699 Other pulmonary embolism without acute cor pulmonale: Secondary | ICD-10-CM

## 2013-07-31 DIAGNOSIS — C8590 Non-Hodgkin lymphoma, unspecified, unspecified site: Secondary | ICD-10-CM

## 2013-07-31 LAB — PROTIME-INR
INR: 3.1 (ref 2.00–3.50)
Protime: 37.2 Seconds — ABNORMAL HIGH (ref 10.6–13.4)

## 2013-07-31 LAB — POCT INR: INR: 3.1

## 2013-07-31 NOTE — Progress Notes (Signed)
INR essentially at goal today. Pt has not had coumadin since 12/29. Diflucan started on 07/24/13, day #8/14. Cipro to finish today (~ 10 days). No changes to report. No problems regarding anticoagulation. Hold coumadin today (07/31/13).  On 08/01/13, begin coumadin 2mg  daily.  Recheck INR with scheduled appointments on 08/05/13; lab at 10am, treatment at 10:30am and Coumadin clinic at 10:45am.

## 2013-07-31 NOTE — Patient Instructions (Signed)
Hold coumadin today (07/31/13).  On 08/01/13, begin coumadin 2mg  daily.  Recheck INR with scheduled appointments on 08/05/13; lab at 10am, treatment at 10:30am and Coumadin clinic at 10:45am.

## 2013-08-03 ENCOUNTER — Telehealth: Payer: Self-pay | Admitting: Internal Medicine

## 2013-08-03 ENCOUNTER — Ambulatory Visit: Payer: Medicare Other

## 2013-08-03 ENCOUNTER — Telehealth: Payer: Self-pay | Admitting: Medical Oncology

## 2013-08-03 ENCOUNTER — Other Ambulatory Visit: Payer: Self-pay | Admitting: Medical Oncology

## 2013-08-03 DIAGNOSIS — R1310 Dysphagia, unspecified: Secondary | ICD-10-CM | POA: Insufficient documentation

## 2013-08-03 NOTE — Telephone Encounter (Signed)
I called pt to inform her that we have her scheduled for a swallowing evaluation 08/07/13 at [facility]. She needs to arrive at 12:45pm. She and husband voiced understanding. Pt 's husband asked about her chemo treatment and injection. I reviewed that she will get treatment here D 1 and on D 2 and D 3 she will take the oral chemo. He has not picked this up from [facility] but her will get and D 4 injection.I instructed on Saturday to go back to the infusion room because there will not be anyone to check them in out front.

## 2013-08-03 NOTE — Telephone Encounter (Signed)
In error,

## 2013-08-05 ENCOUNTER — Encounter: Payer: Self-pay | Admitting: Family Medicine

## 2013-08-05 ENCOUNTER — Other Ambulatory Visit: Payer: Self-pay | Admitting: *Deleted

## 2013-08-05 ENCOUNTER — Ambulatory Visit: Payer: Medicare Other

## 2013-08-05 ENCOUNTER — Other Ambulatory Visit: Payer: Self-pay

## 2013-08-05 ENCOUNTER — Ambulatory Visit: Payer: Medicare Other | Admitting: Pharmacist

## 2013-08-05 ENCOUNTER — Telehealth: Payer: Self-pay | Admitting: Internal Medicine

## 2013-08-05 ENCOUNTER — Other Ambulatory Visit (HOSPITAL_BASED_OUTPATIENT_CLINIC_OR_DEPARTMENT_OTHER): Payer: Medicare Other

## 2013-08-05 DIAGNOSIS — C8590 Non-Hodgkin lymphoma, unspecified, unspecified site: Secondary | ICD-10-CM

## 2013-08-05 DIAGNOSIS — Z7901 Long term (current) use of anticoagulants: Secondary | ICD-10-CM

## 2013-08-05 DIAGNOSIS — B370 Candidal stomatitis: Secondary | ICD-10-CM

## 2013-08-05 LAB — CBC WITH DIFFERENTIAL/PLATELET
BASO%: 0.7 % (ref 0.0–2.0)
Basophils Absolute: 0 10*3/uL (ref 0.0–0.1)
EOS ABS: 0 10*3/uL (ref 0.0–0.5)
EOS%: 1.1 % (ref 0.0–7.0)
HCT: 34.2 % — ABNORMAL LOW (ref 34.8–46.6)
HGB: 11.2 g/dL — ABNORMAL LOW (ref 11.6–15.9)
LYMPH#: 1 10*3/uL (ref 0.9–3.3)
LYMPH%: 35.6 % (ref 14.0–49.7)
MCH: 30.6 pg (ref 25.1–34.0)
MCHC: 32.7 g/dL (ref 31.5–36.0)
MCV: 93.4 fL (ref 79.5–101.0)
MONO#: 0.2 10*3/uL (ref 0.1–0.9)
MONO%: 8.6 % (ref 0.0–14.0)
NEUT%: 54 % (ref 38.4–76.8)
NEUTROS ABS: 1.5 10*3/uL (ref 1.5–6.5)
PLATELETS: 49 10*3/uL — AB (ref 145–400)
RBC: 3.66 10*6/uL — AB (ref 3.70–5.45)
RDW: 16.9 % — AB (ref 11.2–14.5)
WBC: 2.8 10*3/uL — AB (ref 3.9–10.3)
nRBC: 0 % (ref 0–0)

## 2013-08-05 LAB — PROTIME-INR
INR: 2.9 (ref 2.00–3.50)
PROTIME: 34.8 s — AB (ref 10.6–13.4)

## 2013-08-05 LAB — POCT INR: INR: 2.9

## 2013-08-05 NOTE — Telephone Encounter (Signed)
Gave pt appt for labs only per [facility] for 1/12th and 1/15th

## 2013-08-05 NOTE — Patient Instructions (Signed)
Take 2mg  coumadin today and tomorrow.  Then start 4mg  daily.   Recheck INR with scheduled appointments on 08/13/13; Lab at 8am and Coumadin clinic at 8:45am

## 2013-08-05 NOTE — Progress Notes (Signed)
Pt was seen in clinic today after she was seen by MD Infusion was cancelled with pltc of 49 She has been holding coumadin due to supratherapeuitc INR combined with cipro and diflucan She completed these two drugs and INR=2.9 today Will resume coumadin at 2mg  daily for 2 days then start 4mg  daily until her visit next week RTC on 08/13/13 at 8:00 for lab and 8:15 for CC

## 2013-08-06 ENCOUNTER — Other Ambulatory Visit: Payer: Self-pay | Admitting: Medical Oncology

## 2013-08-06 ENCOUNTER — Encounter: Payer: Self-pay | Admitting: Pharmacist

## 2013-08-06 ENCOUNTER — Other Ambulatory Visit: Payer: Self-pay | Admitting: Internal Medicine

## 2013-08-06 ENCOUNTER — Telehealth: Payer: Self-pay | Admitting: Internal Medicine

## 2013-08-06 DIAGNOSIS — C8590 Non-Hodgkin lymphoma, unspecified, unspecified site: Secondary | ICD-10-CM

## 2013-08-06 MED ORDER — MIRTAZAPINE 30 MG PO TABS: 30.0000 mg | ORAL_TABLET | Freq: Every day | ORAL | Status: DC

## 2013-08-06 NOTE — Telephone Encounter (Signed)
I have a brief conversation with patient's daughter Michela Pitcher concerning her appetite has been decreased along with feelings of being down since her illness.  She has had documented weight lost.  We discussed implementing the following: 1) Start Remeron 30 mg qhs for mood disorder and appetite stimulation (discussed with pharmacy); 2) Referral to lymphoma support group; and 3) Referral to nutrition for additional recommendations.   We will follow the patient closely.   Plan discussed with patient and she is agreeable and will call with any questions or concerns.

## 2013-08-06 NOTE — Progress Notes (Signed)
Dr Juliann Mule considering adding Remeron for appetite stimulation/depressive disorder.   Pt on Warfarin.  Followed by [facility] Coumadin clinic. Note potential interaction (reference: Up To Date; accessed 08/06/13)- In a study described in mirtazapine prescribing information, administration of mirtazapine (30 mg orally daily) with warfarin increased INR by 0.2 at steady state in 16 healthy volunteers. The mechanism and clinical significance of this interaction are unknown. Pt returning 08/13/13 for next protime check. Kennith Center, Pharm.D., CPP 08/06/2013@2 :35 PM

## 2013-08-07 ENCOUNTER — Ambulatory Visit (HOSPITAL_COMMUNITY)
Admission: RE | Admit: 2013-08-07 | Discharge: 2013-08-07 | Disposition: A | Discharge status: Home / Self Care | Payer: Medicare Other | Source: Ambulatory Visit | Attending: Internal Medicine | Admitting: Internal Medicine

## 2013-08-07 ENCOUNTER — Other Ambulatory Visit: Payer: Self-pay | Admitting: *Deleted

## 2013-08-07 ENCOUNTER — Encounter: Payer: Self-pay | Admitting: *Deleted

## 2013-08-07 DIAGNOSIS — R059 Cough, unspecified: Secondary | ICD-10-CM | POA: Diagnosis not present

## 2013-08-07 DIAGNOSIS — R1310 Dysphagia, unspecified: Secondary | ICD-10-CM | POA: Diagnosis not present

## 2013-08-07 DIAGNOSIS — R490 Dysphonia: Secondary | ICD-10-CM | POA: Diagnosis not present

## 2013-08-07 DIAGNOSIS — C8589 Other specified types of non-Hodgkin lymphoma, extranodal and solid organ sites: Secondary | ICD-10-CM | POA: Diagnosis not present

## 2013-08-07 DIAGNOSIS — R627 Adult failure to thrive: Secondary | ICD-10-CM | POA: Insufficient documentation

## 2013-08-07 DIAGNOSIS — I10 Essential (primary) hypertension: Secondary | ICD-10-CM | POA: Insufficient documentation

## 2013-08-07 DIAGNOSIS — K219 Gastro-esophageal reflux disease without esophagitis: Secondary | ICD-10-CM | POA: Diagnosis not present

## 2013-08-07 DIAGNOSIS — R05 Cough: Secondary | ICD-10-CM | POA: Insufficient documentation

## 2013-08-07 NOTE — Procedures (Signed)
Objective Swallowing Evaluation: Modified Barium Swallowing Study  Patient Details  Name: Tonya Wise MRN: [id number] Date of Birth: 07-02-1941  Today's Date: 08/07/2013 Time: 1884-1660 SLP Time Calculation (min): 27 min  Past Medical History:  Past Medical History  Diagnosis Date  . Hypertension   . Renal insufficiency   . Osteopenia   . Allergy   . Heart murmur   . GERD (gastroesophageal reflux disease)   . Anemia   . Hypercalcemia 03/27/2013  . Non-Hodgkins lymphoma 04/06/2013  . Diastolic dysfunction, grade I 04/12/2013   Past Surgical History:  Past Surgical History  Procedure Laterality Date  . Appendectomy    . Colonoscopy  2008  . Spine surgery    . Back surgery      LOWER BACK TWICE  . Abdominal hysterectomy    . Esophagogastroduodenoscopy N/A 03/29/2013    Procedure: ESOPHAGOGASTRODUODENOSCOPY (EGD);  Surgeon: Juanita Craver, MD;  Location[facility];  Service: Endoscopy;  Laterality: N/A;  . Inguinal lymph node biopsy Right 03/31/2013    Procedure: INGUINAL LYMPH NODE BIOPSY;  Surgeon: Gwenyth Ober, MD;  Location: [facility];  Service: General;  Laterality: Right;   HPI:  73 yo female referred by Dr Juliann Mule for MBS due to pt complaint of sensation of food/liquid sticking in pharynx.  Pt reports symptoms have largely resolved within the last few weeks.  Pt PMH + for lymphoma, heart failure, cardiomyopathy, hypokalemia, pancytopenia, pulmonary embolism, weakness.  Pt hospitalized in Sept 2014 with FTT, thrush, weakness, mucositis- pt required tube feeding for nutirition.  Weight loss of 15 pounds since September reported by pt.  Vocal hoarseness reported by pt since September and chronic cough "forever" per pt.  Pt  medicine list includes nystain, Waragin, valacyclovir, omeprazole, lisinopril, alloprinol.  Pt admits to her swallwoing improving significantly in the last 3 weeks and she is now able to eat 3 meals a day.       Assessment / Plan / Recommendation Clinical  Impression  Dysphagia Diagnosis: Within Functional Limits Clinical impression: Pt with swallow ability within functional limits given age.  Trace laryngeal penetration of liquids to vocal cords noted that cleared with further swallows.  Chin tuck posture tested for use if indicated in future and aided aiway protection.  Swallow was strong and timely without significant stasis.  SLP provided pt with compensation strategies to mitigate dysphagia episodes with chemo treatment.  Thanks for this referral.      Treatment Recommendation  No treatment recommended at this time    Diet Recommendation Regular;Thin liquid   Liquid Administration via: Cup;Straw Medication Administration: Whole meds with liquid Supervision: Patient able to self feed Compensations: Slow rate;Small sips/bites (drink liquids t/o meal) Postural Changes and/or Swallow Maneuvers: Seated upright 90 degrees;Upright 30-60 min after meal (tuck chin if prevents coughing with liquids)    Other  Recommendations Oral Care Recommendations: Oral care Q4 per protocol           General Date of Onset: 08/07/13 HPI: 73 yo female referred by Dr Juliann Mule for MBS due to pt complaint of sensation of food/liquid sticking in pharynx.  Pt reports symptoms have largely resolved within the last few weeks.  Pt PMH + for lymphoma, heart failure, cardiomyopathy, hypokalemia, pancytopenia, pulmonary embolism, weakness.  Pt hospitalized in Sept 2014 with FTT, thrush, weakness, mucositis- pt required tube feeding for nutirition.  Weight loss of 15 pounds since September reported by pt.  Vocal hoarseness reported by pt since September and chronic cough "forever" per pt.  Pt  medicine list includes nystain, Waragin, valacyclovir, omeprazole, lisinopril, alloprinol.  Pt admits to her swallwoing improving significantly in the last 3 weeks and she is now able to eat 3 meals a day.   Type of Study: Modified Barium Swallowing Study Reason for Referral: Objectively  evaluate swallowing function Diet Prior to this Study: Regular;Dysphagia 3 (soft);Thin liquids (pt checked both soft/regular) Temperature Spikes Noted: No Respiratory Status: Room air Behavior/Cognition: Alert;Cooperative;Pleasant mood Oral Cavity - Dentition: Adequate natural dentition Oral Motor / Sensory Function: Within functional limits Self-Feeding Abilities: Able to feed self Patient Positioning: Upright in chair Baseline Vocal Quality: Clear Volitional Cough: Strong Volitional Swallow: Able to elicit Anatomy: Within functional limits (appearance of calcification at larynx, appearance of osteophyte at C5-C6, C6-C7) Pharyngeal Secretions: Not observed secondary MBS    Reason for Referral Objectively evaluate swallowing function   Oral Phase Oral Preparation/Oral Phase Oral Phase: WFL Oral - Nectar Oral - Nectar Cup: Within functional limits Oral - Thin Oral - Thin Cup: Within functional limits Oral - Thin Straw: Within functional limits Oral - Solids Oral - Puree: Within functional limits Oral - Regular: Within functional limits Oral - Pill: Within functional limits   Pharyngeal Phase Pharyngeal Phase Pharyngeal Phase: Within functional limits Pharyngeal - Nectar Pharyngeal - Nectar Cup: Premature spillage to pyriform sinuses;Within functional limits Pharyngeal - Thin Pharyngeal - Thin Cup: Premature spillage to pyriform sinuses Pharyngeal - Thin Straw: Penetration/Aspiration during swallow Penetration/Aspiration details (thin straw): Material enters airway, CONTACTS cords then ejected out Pharyngeal - Solids Pharyngeal - Puree: Within functional limits Pharyngeal - Regular: Within functional limits Pharyngeal - Pill: Within functional limits  Cervical Esophageal Phase    GO    Cervical Esophageal Phase Cervical Esophageal Phase: WFL (barium tablet appeared to lodge at midesophagus, required liquids to clear)    Functional Assessment Tool Used: mbs, clinical  judgement Functional Limitations: Swallowing Swallow Current Status ([id number]): At least 1 percent but less than 20 percent impaired, limited or restricted Swallow Goal Status [phone]): At least 1 percent but less than 20 percent impaired, limited or restricted Swallow Discharge Status [phone]): At least 1 percent but less than 20 percent impaired, limited or restricted    Luanna Salk, [place] [facility] SLP [phone]

## 2013-08-07 NOTE — Progress Notes (Signed)
[place] Work  Clinical Social Work was referred by Futures trader to offer support and assess for needs.  CSW contacted patient by phone.  Tonya Wise states she has struggled with her appetite, but feels "very upbeat and positive" and reports feeling well supported by her family.  She identifies her support system to include her husband(whom she identifies as her main support and best friend) and her two daughters.  Patient stated she was concerned about loss of appetite but has recently been prescribed medication from Dr. Juliann Mule to help with this issue.  CSW briefly discussed support options including support groups and free counseling at cancer center.  The patient is not interested at this time but agreed to contact CSW in the future as needed.   Polo Riley, MSW, LCSW, OSW-C Clinical Social Worker [facility] [phone]

## 2013-08-08 ENCOUNTER — Ambulatory Visit: Payer: Medicare Other

## 2013-08-10 ENCOUNTER — Other Ambulatory Visit: Payer: Medicare Other

## 2013-08-10 ENCOUNTER — Ambulatory Visit: Payer: Medicare Other

## 2013-08-12 ENCOUNTER — Ambulatory Visit (INDEPENDENT_AMBULATORY_CARE_PROVIDER_SITE_OTHER): Payer: Medicare Other | Admitting: Cardiovascular Disease

## 2013-08-12 ENCOUNTER — Encounter: Payer: Self-pay | Admitting: Cardiovascular Disease

## 2013-08-12 VITALS — BP 140/80 | HR 92 | Ht 62.0 in | Wt 120.4 lb

## 2013-08-12 DIAGNOSIS — C8590 Non-Hodgkin lymphoma, unspecified, unspecified site: Secondary | ICD-10-CM

## 2013-08-12 DIAGNOSIS — C8589 Other specified types of non-Hodgkin lymphoma, extranodal and solid organ sites: Secondary | ICD-10-CM

## 2013-08-12 DIAGNOSIS — I519 Heart disease, unspecified: Secondary | ICD-10-CM

## 2013-08-12 DIAGNOSIS — R05 Cough: Secondary | ICD-10-CM

## 2013-08-12 DIAGNOSIS — R059 Cough, unspecified: Secondary | ICD-10-CM | POA: Diagnosis not present

## 2013-08-12 DIAGNOSIS — I428 Other cardiomyopathies: Secondary | ICD-10-CM

## 2013-08-12 DIAGNOSIS — I429 Cardiomyopathy, unspecified: Secondary | ICD-10-CM

## 2013-08-12 DIAGNOSIS — I5189 Other ill-defined heart diseases: Secondary | ICD-10-CM

## 2013-08-12 MED ORDER — LOSARTAN POTASSIUM 25 MG PO TABS: 25.0000 mg | ORAL_TABLET | Freq: Two times a day (BID) | ORAL | Status: DC

## 2013-08-12 NOTE — Progress Notes (Signed)
Patient ID: Tonya Wise, female   DOB: 1940-09-02, 73 y.o.   MRN: [id number]     HPI: [name] is a 73 year old female who was initially referred to me through the courtesy of Dr. Redmond School for evaluation of recent increased heart rate in this patient status post documentation of recent coronary embolism. She was seen initially on 06/19/2013.  [name] is a 73 year old female without known prior cardiac history. She developed anemia and dehydration this past summer and ultimately was diagnosed as having non-Hodgkin's lymphoma. She received 2 treatments of chemotherapy. On 06/16/2013 she underwent a followup CT scan which showed slight interval reduction in axillary lymphadenopathy but there was a 1 small residual right axillary lymph node measuring 16 a 12 mm. She also is noted a significant reduction in mediastinal and hilar adenopathy. However, it was noted that she had a left-sided pulmonary embolism. There also was resolution of bulky abdominal lymphadenopathy as well as interval decrease in the size of her spleen since initiating chemotherapy. Because of the diagnosis of left-sided pulmonary embolism she apparently started therapy with combination Lovenox as well as oral Coumadin. She apparently was seen in Dr. Lanice Shirts office on 06/17/2013 with complaints of increasing cough that has become productive. She was started on Zithromax for possible acute bronchitis. She was tachycardic and did have T-wave changes.   A 2-D echo Doppler study in September 2014 while she was hospitalized at [facility] demonstrated an ejection fraction was 50-55%. She had pulmonary hypertension with estimated RV systolic pressure at 39 mm and Grade 1 diastolic dysfunction.  When I had seen her, I did recommend that she undergo a nuclear perfusion study because of some issues with chest discomfort. Her nuclear perfusion study demonstrated normal perfusion, however ejection fraction was interpreted at 42%.  Subsequently, I did recommend a followup echo Doppler study and also discuss this with her oncologist. Her followup echo Doppler study was done on 07/16/2013. This confirmed LV dysfunction and ejection fraction was now 35-40%. There was diffuse hypokinesis. She now had grade 2 diastolic dysfunction. Absolutely, I did discuss this with her oncologist. He has planned to reduce her chemotherapeutic regimen in light of her LV dysfunction. She also has had some issues with low white blood count. She did have lab work done last week and will have followup blood work done tomorrow by Dr.Chism.   She does complain of a persistent cough. She denies any awareness of wheezing. She denies any further episodes of tachypalpitations. There is no PND or orthopnea. She presents for evaluation.  Past Medical History  Diagnosis Date  . Hypertension   . Renal insufficiency   . Osteopenia   . Allergy   . Heart murmur   . GERD (gastroesophageal reflux disease)   . Anemia   . Hypercalcemia 03/27/2013  . Non-Hodgkins lymphoma 04/06/2013  . Diastolic dysfunction, grade I 04/12/2013    Past Surgical History  Procedure Laterality Date  . Appendectomy    . Colonoscopy  2008  . Spine surgery    . Back surgery      LOWER BACK TWICE  . Abdominal hysterectomy    . Esophagogastroduodenoscopy N/A 03/29/2013    Procedure: ESOPHAGOGASTRODUODENOSCOPY (EGD);  Surgeon: Juanita Craver, MD;  Location: [facility] ENDOSCOPY;  Service: Endoscopy;  Laterality: N/A;  . Inguinal lymph node biopsy Right 03/31/2013    Procedure: INGUINAL LYMPH NODE BIOPSY;  Surgeon: Gwenyth Ober, MD;  Location: [facility];  Service: General;  Laterality: Right;    Allergies  Allergen Reactions  . Penicillins Rash    Current Outpatient Prescriptions  Medication Sig Dispense Refill  . albuterol (PROVENTIL) (5 MG/ML) 0.5% nebulizer solution Take 0.5 mLs (2.5 mg total) by nebulization every 4 (four) hours as needed for wheezing or shortness of breath.  20 mL  12  .  allopurinol (ZYLOPRIM) 300 MG tablet Take 1 tablet (300 mg total) by mouth daily.  30 tablet  3  . bisoprolol (ZEBETA) 10 MG tablet Take 1 tablet by mouth daily.      Marland Kitchen dextromethorphan (DELSYM) 30 MG/5ML liquid Take 10 mLs (60 mg total) by mouth 2 (two) times daily.  89 mL  0  . KLOR-CON M10 10 MEQ tablet Take 1 tablet by mouth daily.      Marland Kitchen lidocaine-prilocaine (EMLA) cream Apply 1 application topically as needed. Apply to port site one hour before treatment and cover with plastic wrap.  30 g  3  . nystatin (MYCOSTATIN) 100000 UNIT/ML suspension Take 5 mLs (500,000 Units total) by mouth 4 (four) times daily as needed. Swish and swallow  Or  Swish and spit  480 mL  0  . omeprazole (PRILOSEC) 40 MG capsule Take 1 capsule by mouth daily.      . ondansetron (ZOFRAN) 4 MG tablet Take 1 tablet (4 mg total) by mouth every 6 (six) hours as needed for nausea.  20 tablet  0  . valACYclovir (VALTREX) 500 MG tablet Take 1 tablet (500 mg total) by mouth daily.  60 tablet  1  . warfarin (COUMADIN) 2.5 MG tablet Take 2.5 mg by mouth daily.       No current facility-administered medications for this visit.    Social history is notable in that she is married for 54 years. 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 She has 2 children and 2 grandchildren. No tobacco use presently. She quit remotely in 1962. There is no alcohol use. She does not routinely exercise.  Family History  Problem Relation Age of Onset  . Heart attack Father 57  . Leukemia Mother   . Diabetes Brother   . Diabetes Sister     ROS is negative for fever chills or night sweats. She denies skin rash. She denies visual changes. There is no change in hearing. She does have non-Hodgkin's lymphoma. He continues to have a  cough. She was diagnosed with a left  Pulmonary embolism by recent CT was also shown reduction in previous adenopathy. She doesn't a mild shortness of breath with activity. There is no chest tightness. She denies  abdominal complaints presently. She denies blood in her stool or urine. There is no claudication. Times his transient leg swelling. There is no cold or heat intolerance. She does have a prescription for albuterol which she takes when necessary wheezing. She does have protein calorie malnutrition for which he takes Ensure complete feeding supplement. She denies on any bone discomfort. At times she does have intermittent back discomfort. Other comprehensive 14 point system review is negative.  PE BP 140/80  Pulse 92  Ht 5\' 2"  (1.575 m)  Wt 120 lb 6.4 oz (54.613 kg)  BMI 22.02 kg/m2 General: Alert, oriented, no distress.  Skin: normal turgor, no rashes HEENT: Normocephalic, atraumatic. Pupils round and reactive; sclera anicteric; Fundi without hemorrhages or exudates. No papilledema Nose without nasal septal hypertrophy 2 Mouth/Parynx benign; Mallinpatti scale Neck: No JVD, no carotid bruits Chest wall: No tenderness to palpation Lungs: clear to ausculatation and percussion; no wheezing or  rales Heart: RRR, s1 s2 normal 1/6 systolic murmur. Abdomen: soft, nontender; no hepatosplenomehaly, BS+; abdominal aorta nontender and not dilated by palpation. Back: No CVA tenderness Pulses 2+ Extremities: no clubbinbg cyanosis or edema, Homan's sign negative  Neurologic: grossly nonfocal Psychologic: Normal mood and affect   ECG: I did review the EKG taken 06/17/2013 at Dr. Royetta Car office. That EKG showed sinus tachycardia 117 beats per minute with inferolateral and anterolateral T wave abnormalities. EKG at  office visit in taken in our November shows sinus rhythm 87 beats per minute. She does have slightly more pronounced T wave inversion in leads II, III, and F the 3 through V6.  LABS:  BMET    Component Value Date/Time   NA 140 07/27/2013 0836   NA 139 07/17/2013 0834   K 3.0* 07/27/2013 0836   K 3.3* 07/17/2013 0834   CL 102 07/17/2013 0834   CO2 25 07/27/2013 0836   CO2 24  07/17/2013 0834   GLUCOSE 88 07/27/2013 0836   GLUCOSE 81 07/17/2013 0834   BUN 3.5* 07/27/2013 0836   BUN 6 07/17/2013 0834   CREATININE 0.9 07/27/2013 0836   CREATININE 0.85 07/17/2013 0834   CREATININE 0.96 04/29/2013 0500   CALCIUM 8.3* 07/27/2013 0836   CALCIUM 8.5 07/17/2013 0834   GFRNONAA 58* 04/29/2013 0500   GFRAA 67* 04/29/2013 0500     Hepatic Function Panel     Component Value Date/Time   PROT 5.0* 07/27/2013 0836   PROT 5.1* 07/17/2013 0834   ALBUMIN 3.2* 07/27/2013 0836   ALBUMIN 3.5 07/17/2013 0834   AST 22 07/27/2013 0836   AST 21 07/17/2013 0834   ALT 10 07/27/2013 0836   ALT 8 07/17/2013 0834   ALKPHOS 56 07/27/2013 0836   ALKPHOS 53 07/17/2013 0834   BILITOT 0.34 07/27/2013 0836   BILITOT 0.5 07/17/2013 0834     CBC    Component Value Date/Time   WBC 2.8* 08/05/2013 1019   WBC 2.0* 07/17/2013 0834   RBC 3.66* 08/05/2013 1019   RBC 3.59* 07/17/2013 0834   HGB 11.2* 08/05/2013 1019   HGB 11.0* 07/17/2013 0834   HCT 34.2* 08/05/2013 1019   HCT 32.6* 07/17/2013 0834   PLT 49* 08/05/2013 1019   PLT 68* 07/17/2013 0834   MCV 93.4 08/05/2013 1019   MCV 90.8 07/17/2013 0834   MCH 30.6 08/05/2013 1019   MCH 30.6 07/17/2013 0834   MCHC 32.7 08/05/2013 1019   MCHC 33.7 07/17/2013 0834   RDW 16.9* 08/05/2013 1019   RDW 19.0* 07/17/2013 0834   LYMPHSABS 1.0 08/05/2013 1019   LYMPHSABS 0.4* 04/29/2013 0500   MONOABS 0.2 08/05/2013 1019   MONOABS 0.4 04/29/2013 0500   EOSABS 0.0 08/05/2013 1019   EOSABS 0.0 04/29/2013 0500   BASOSABS 0.0 08/05/2013 1019   BASOSABS 0.0 04/29/2013 0500     BNP    Component Value Date/Time   PROBNP 16301.0* 04/22/2013 0530    Lipid Panel     Component Value Date/Time   CHOL 234* 07/17/2013 0834     RADIOLOGY: Ct Chest W Contrast  06/16/2013   CLINICAL DATA:  Non-Hodgkin's lymphoma.  EXAM: CT CHEST, ABDOMEN, AND PELVIS WITH CONTRAST  TECHNIQUE: Multidetector CT imaging of the chest, abdomen and pelvis was performed following the  standard protocol during bolus administration of intravenous contrast.  CONTRAST:  134mL OMNIPAQUE IOHEXOL 300 MG/ML  SOLN  COMPARISON:  03/30/2013.  FINDINGS: CT CHEST FINDINGS  The chest wall demonstrates a right-sided Port-A-Cath in  good position without complicating features. Bilateral breast prosthesis are noted. The bulky bilateral axillary lymphadenopathy has resolved. There is a small residual right axillary node on image number 12 which measures 16 x 12 mm. It previously measured 27 x 20 mm. It did show slight hypermetabolism on the PET scan. Other normal size axillary lymph nodes did not show any hyper metabolism.  The bony thorax is intact. No destructive bone lesions or spinal canal compromise.  The heart is normal in size. No pericardial effusion. No mediastinal or hilar mass. The bulky lymphadenopathy seen on the prior study has significantly decreased in size. The subcarinal nodal mass previously measured 4.0 x 2.2 cm and now measures 2.0 x 1.3 cm. The aorta is normal in caliber. Stable atherosclerotic calcifications. There is clot noted in the left lower lobe pulmonary artery consistent with pulmonary embolism. There is also a small amount of string like adherent clot in the main left pulmonary artery.  Examination of the lung parenchyma demonstrates no worrisome pulmonary nodules. Stable area of subpleural scarring type changes in the right middle lobe. There is a small right pleural effusion with overlying atelectasis.  CT ABDOMEN AND PELVIS FINDINGS  The liver demonstrates diffuse fatty infiltration but no focal hepatic lesions or biliary dilatation. The gallbladder is normal. No common bowel duct dilatation. The pancreas is normal. The spleen remains mildly enlarged and there are multiple low-attenuation lesions. The adrenal glands and kidneys are unremarkable and unchanged.  The stomach, duodenum, small bowel and colon are unremarkable. No inflammatory changes, mass lesions or obstructive  findings. There are small scattered mesenteric and retroperitoneal lymph nodes. This reflects a significant improvement since the prior CT scan where there was bulky adenopathy. The aorta and branch vessels are patent. Stable atherosclerotic calcifications.  The uterus and ovaries are unremarkable and stable. The bladder is normal. No pelvic mass, adenopathy or free pelvic fluid collections. No inguinal mass or adenopathy. The borderline inguinal lymph nodes have resolved.  No significant bony findings.  Stable lumbar fusion changes.  IMPRESSION: 1. Significant interval reduction in axillary lymphadenopathy. There is 1 small residual right axillary lymph node measuring 16 x 12 mm which did show FDG uptake. 2. Significant reduction in mediastinal and hilar lymphadenopathy. 3. Left-sided pulmonary embolism. 4. Resolution of bulky abdominal lymphadenopathy. 5. Interval decrease in size of the spleen (15 x 14 x 10.5 versus 12 x 12 x 9.5). Persistent small lesions.   Electronically Signed   By: Kalman Jewels M.D.   On: 06/16/2013 15:11   Ct Abdomen Pelvis W Contrast  06/16/2013   CLINICAL DATA:  Non-Hodgkin's lymphoma.  EXAM: CT CHEST, ABDOMEN, AND PELVIS WITH CONTRAST  TECHNIQUE: Multidetector CT imaging of the chest, abdomen and pelvis was performed following the standard protocol during bolus administration of intravenous contrast.  CONTRAST:  162mL OMNIPAQUE IOHEXOL 300 MG/ML  SOLN  COMPARISON:  03/30/2013.  FINDINGS: CT CHEST FINDINGS  The chest wall demonstrates a right-sided Port-A-Cath in good position without complicating features. Bilateral breast prosthesis are noted. The bulky bilateral axillary lymphadenopathy has resolved. There is a small residual right axillary node on image number 12 which measures 16 x 12 mm. It previously measured 27 x 20 mm. It did show slight hypermetabolism on the PET scan. Other normal size axillary lymph nodes did not show any hyper metabolism.  The bony thorax is intact. No  destructive bone lesions or spinal canal compromise.  The heart is normal in size. No pericardial effusion. No mediastinal or hilar mass.  The bulky lymphadenopathy seen on the prior study has significantly decreased in size. The subcarinal nodal mass previously measured 4.0 x 2.2 cm and now measures 2.0 x 1.3 cm. The aorta is normal in caliber. Stable atherosclerotic calcifications. There is clot noted in the left lower lobe pulmonary artery consistent with pulmonary embolism. There is also a small amount of string like adherent clot in the main left pulmonary artery.  Examination of the lung parenchyma demonstrates no worrisome pulmonary nodules. Stable area of subpleural scarring type changes in the right middle lobe. There is a small right pleural effusion with overlying atelectasis.  CT ABDOMEN AND PELVIS FINDINGS  The liver demonstrates diffuse fatty infiltration but no focal hepatic lesions or biliary dilatation. The gallbladder is normal. No common bowel duct dilatation. The pancreas is normal. The spleen remains mildly enlarged and there are multiple low-attenuation lesions. The adrenal glands and kidneys are unremarkable and unchanged.  The stomach, duodenum, small bowel and colon are unremarkable. No inflammatory changes, mass lesions or obstructive findings. There are small scattered mesenteric and retroperitoneal lymph nodes. This reflects a significant improvement since the prior CT scan where there was bulky adenopathy. The aorta and branch vessels are patent. Stable atherosclerotic calcifications.  The uterus and ovaries are unremarkable and stable. The bladder is normal. No pelvic mass, adenopathy or free pelvic fluid collections. No inguinal mass or adenopathy. The borderline inguinal lymph nodes have resolved.  No significant bony findings.  Stable lumbar fusion changes.  IMPRESSION: 1. Significant interval reduction in axillary lymphadenopathy. There is 1 small residual right axillary lymph node  measuring 16 x 12 mm which did show FDG uptake. 2. Significant reduction in mediastinal and hilar lymphadenopathy. 3. Left-sided pulmonary embolism. 4. Resolution of bulky abdominal lymphadenopathy. 5. Interval decrease in size of the spleen (15 x 14 x 10.5 versus 12 x 12 x 9.5). Persistent small lesions.   Electronically Signed   By: Kalman Jewels M.D.   On: 06/16/2013 15:11   Nm Pet Image Restag (ps) Skull Base To Thigh  06/16/2013   CLINICAL DATA:  Subsequent treatment strategy for non-Hodgkin's lymphoma.  EXAM: NUCLEAR MEDICINE PET SKULL BASE TO THIGH  FASTING BLOOD GLUCOSE:  Value: 105mg /dl  TECHNIQUE: 19.0 mCi F-18 FDG was injected intravenously. CT data was obtained and used for attenuation correction and anatomic localization only. (This was not acquired as a diagnostic CT examination.) Additional exam technical data entered on technologist worksheet.  COMPARISON:  CT scan 03/30/2013.  FINDINGS: NECK  No hypermetabolic lymph nodes in the neck.  CHEST  There is a single right axillary lymph node measuring 16 x 10 mm in demonstrates FDG uptake with SUV max of 5.5. This would suggest some residual metabolically active lymphoma. The mediastinal and hilar lymph nodes are much smaller than a previous chest CT and I do not see any definite metabolic activity. No worrisome pulmonary lesions. The right middle lobe subpleural density is relatively stable and is likely scarring or atelectasis. No abnormal FDG uptake is identified. There is a small right pleural effusion.  ABDOMEN/PELVIS  No persistence/residual lymphadenopathy in the abdomen. A few small scattered lymph nodes are noted but the bulky adenopathy has resolved. No metabolic activity is identified in the remaining small lymph nodes. Stable mild splenomegaly with numerous small lesions but no hyper metabolism.  SKELETON  Diffuse osseous uptake is likely due to rebound marrow activity or marrow stimulating drugs.  IMPRESSION: 1. Single metabolically  active 16 x 10 mm in the right axilla.  2. Persistent but definitely smaller mediastinal and hilar lymph node without definite metabolic activity. 3. Resolution of abdominal adenopathy with some scattered residual lymph nodes but no metabolic activity. 4. Persistent mild splenomegaly and small splenic lesions without obvious hyper metabolism.   Electronically Signed   By: Kalman Jewels M.D.   On: 06/16/2013 14:38     ASSESSMENT AND PLAN: Ms. Smid is a 73 year old female with recent diagnosis of non-Hodgkin's lymphoma and initially has been demonstrated to have some improvement in her prior more significant adenopathy. An echo Doppler study done in September did show normal systolic function with diastolic dysfunction. Her BMP was elevated at that time suggesting congestive heart failure. She was diagnosed with a pulmonary embolism in November and has been on anticoagulation. She did receive several courses of chemotherapy. Subsequent LV function has decreased. Her nuclear perfusion study revealed an ejection fraction of 42%. She had probable breast attenuation artifact without significant ischemia and was interpreted as low risk. Her echo Doppler study showed an ejection fraction of 35-40% with diffuse hypokinesis and grade 2 diastolic dysfunction. She does have a chronic cough. Presently I recommend she discontinue her lisinopril since it's possible that the ACE inhibitor may be contributing to her cough. I will change this to losartan and initially she will take 25 mg daily for the first week and then I will titrate this to 25 mg twice a day. This hopefully will have benefit in improving LV function. If she still has significant cough it may be worthwhile to change her beta blocker therapy to the systolic which is more cardioselective and also associated with less fatigue, but I will not do this presently. She will be having followup blood work tomorrow at the oncologist office. I will see her in 6-8  weeks followup evaluation and further recommendations will be made at that time.  Troy Sine, MD, [facility] 08/12/2013 10:15 AM

## 2013-08-12 NOTE — Patient Instructions (Signed)
Stop lisinopril  Start Losartan 25 MG ONCE A DAY FOR THE FIRST WEEK ,THEN TAKE TWICE A DAY.  Your physician wants you to follow-up in [place].  You will receive a reminder letter in the mail two months in advance. If you don't receive a letter, please call our office to schedule the follow-up appointment.

## 2013-08-13 ENCOUNTER — Other Ambulatory Visit: Payer: Medicare Other

## 2013-08-13 ENCOUNTER — Other Ambulatory Visit: Payer: Self-pay | Admitting: Internal Medicine

## 2013-08-13 ENCOUNTER — Ambulatory Visit: Payer: Medicare Other

## 2013-08-13 ENCOUNTER — Other Ambulatory Visit (HOSPITAL_BASED_OUTPATIENT_CLINIC_OR_DEPARTMENT_OTHER): Payer: Medicare Other

## 2013-08-13 ENCOUNTER — Telehealth: Payer: Self-pay | Admitting: Internal Medicine

## 2013-08-13 ENCOUNTER — Ambulatory Visit (HOSPITAL_BASED_OUTPATIENT_CLINIC_OR_DEPARTMENT_OTHER): Payer: Medicare Other | Admitting: Pharmacist

## 2013-08-13 DIAGNOSIS — I2699 Other pulmonary embolism without acute cor pulmonale: Secondary | ICD-10-CM

## 2013-08-13 DIAGNOSIS — Z7901 Long term (current) use of anticoagulants: Secondary | ICD-10-CM | POA: Diagnosis not present

## 2013-08-13 DIAGNOSIS — C8588 Other specified types of non-Hodgkin lymphoma, lymph nodes of multiple sites: Secondary | ICD-10-CM | POA: Diagnosis not present

## 2013-08-13 DIAGNOSIS — Z5181 Encounter for therapeutic drug level monitoring: Secondary | ICD-10-CM | POA: Diagnosis not present

## 2013-08-13 DIAGNOSIS — C8590 Non-Hodgkin lymphoma, unspecified, unspecified site: Secondary | ICD-10-CM

## 2013-08-13 LAB — POCT INR: INR: 6.85

## 2013-08-13 LAB — PROTHROMBIN TIME
INR: 6.85 (ref <1.50)
Prothrombin Time: 56.5 seconds — ABNORMAL HIGH (ref 11.6–15.2)

## 2013-08-13 LAB — PROTIME-INR

## 2013-08-13 NOTE — Progress Notes (Signed)
INR = 6.85 Pt has been took Coumadin 2mg  daily for 2 days as instructed at last visit then started 4mg  daily. Pt is accompanied today by her husband, [name]. She has not missed doses of Coumadin. Has bruise on R hand & thumb (minor injury; hit her hand at home).  This is improving per pts husband. She had recent sore throat for a couple of days.  Pt attributes this to sinus drainage.  She did have temperature.  No exact temperature reported.  There is no current fever & the sore throat is better. Ongoing cough.  Productive in the mornings & as day goes on, it gets more dry/non-productive.  She saw her Cardiologist for this (Dr. Claiborne Billings).  He is switching her lisinopril to losartan.  She will start losartan today & take 1 tab daily for 1 week then increase to 2 tablets daily. Losartan is CYP 2C9 inhibitor; warfarin a 2C9 substrate, so we may see her INR increase.   She c/o tongue soreness & burning when she eats.  She has improving thrush.  She is not currently on Diflucan.  She uses Nystatin 2-3 times daily.  Sometimes she swallows it & sometimes she spits it out.  Nystatin should not be causing fluctuations in her INR. She is eating a little better.  She really does not have diminished appetite.  It seems to be the thrush keeping her from eating well. She has RX for Remeron but she has not started using it.  She is considering starting that today.  It has potential to increase INR slightly. Her allopurinol will stop at this point per Dr. Juliann Mule (decr risk for TLS).  This may help bring her INR down a little. INR supratherapeutic.  Multiple causes (Coumadin dose increase & decreased PO intake, recent Diflucan). I s/w Dr. Juliann Mule & he has recommended holding Coumadin for now & recheck her INR Mon 08/17/13. He'd also like her to begin a low dose vit K so I advised pts husband to get some Centrum or Centrum Silver which contains vit K 50 mcg.  She should begin this today & take 1 tablet daily.  We should consider  keeping this on board while on Coumadin per Dr. Juliann Mule. As far as the Remeron, I advised the pts husband to hold that until we get her INR under control. We need to dose cautiously in this lady as she is sensitive to dose changes & she will be on losartan. Kennith Center, Pharm.D., CPP 08/13/2013@10 :37 AM

## 2013-08-13 NOTE — Telephone Encounter (Signed)
Patient was informed of her INR elevated to 6.8.  She has been instructed to hold her coumadin with repeat INR for Monday of next week.  She denies any bleeding.  We discussed this with pharmacy.  In addition, we will add low dose vitamin k 100 mcg which might help with her recent fluctuations.  She has not started on the appetite stimulant.

## 2013-08-17 ENCOUNTER — Other Ambulatory Visit (HOSPITAL_BASED_OUTPATIENT_CLINIC_OR_DEPARTMENT_OTHER): Payer: Medicare Other

## 2013-08-17 ENCOUNTER — Other Ambulatory Visit: Payer: Self-pay | Admitting: Internal Medicine

## 2013-08-17 ENCOUNTER — Ambulatory Visit (HOSPITAL_BASED_OUTPATIENT_CLINIC_OR_DEPARTMENT_OTHER): Payer: Medicare Other | Admitting: Pharmacist

## 2013-08-17 DIAGNOSIS — I2699 Other pulmonary embolism without acute cor pulmonale: Secondary | ICD-10-CM

## 2013-08-17 DIAGNOSIS — C8589 Other specified types of non-Hodgkin lymphoma, extranodal and solid organ sites: Secondary | ICD-10-CM | POA: Diagnosis not present

## 2013-08-17 DIAGNOSIS — C8590 Non-Hodgkin lymphoma, unspecified, unspecified site: Secondary | ICD-10-CM

## 2013-08-17 LAB — CBC WITH DIFFERENTIAL/PLATELET
BASO%: 0.4 % (ref 0.0–2.0)
BASOS ABS: 0 10*3/uL (ref 0.0–0.1)
EOS%: 3.1 % (ref 0.0–7.0)
Eosinophils Absolute: 0.1 10*3/uL (ref 0.0–0.5)
HEMATOCRIT: 33.9 % — AB (ref 34.8–46.6)
HEMOGLOBIN: 11.2 g/dL — AB (ref 11.6–15.9)
LYMPH#: 1 10*3/uL (ref 0.9–3.3)
LYMPH%: 22.7 % (ref 14.0–49.7)
MCH: 30.5 pg (ref 25.1–34.0)
MCHC: 33 g/dL (ref 31.5–36.0)
MCV: 92.4 fL (ref 79.5–101.0)
MONO#: 0.7 10*3/uL (ref 0.1–0.9)
MONO%: 14.3 % — ABNORMAL HIGH (ref 0.0–14.0)
NEUT#: 2.7 10*3/uL (ref 1.5–6.5)
NEUT%: 59.5 % (ref 38.4–76.8)
Platelets: 120 10*3/uL — ABNORMAL LOW (ref 145–400)
RBC: 3.67 10*6/uL — ABNORMAL LOW (ref 3.70–5.45)
RDW: 15.9 % — ABNORMAL HIGH (ref 11.2–14.5)
WBC: 4.5 10*3/uL (ref 3.9–10.3)
nRBC: 0 % (ref 0–0)

## 2013-08-17 LAB — PROTIME-INR
INR: 2.4 (ref 2.00–3.50)
Protime: 28.8 Seconds — ABNORMAL HIGH (ref 10.6–13.4)

## 2013-08-17 NOTE — Progress Notes (Addendum)
INR within goal today. Hg/Hct = 11.2/ 33.9, Pltc = 120 Pt held coumadin as instructed on 1/16-1/18. No new concerns regarding anticoagulation. Bruise on right hand is healing. Pt is eating more carbs and protein (more frequently) to gain weight. Wt today = 125.4 lbs (Increased about 5 lbs since last visit). She may no longer start Remeron. It has a lot of side effects and she feels uncomfortable with them. She plans to continue with eating her increased amounts of carbs/protein more frequently. Cough (dry, hacking) is still present. Hycodan helps. Pt and husband plan to notify Dr. Georgina Peer in the next few days of ongoing cough. Will resume coumadin at a decreased dose (50% decrease). Begin Coumadin 2mg  daily. Recheck INR on 08/18/13 at 9:45 am for lab, 10:15 am for Treatment and 10:45am for Coumadin Clinic.  Coumadin 2mg  tablets provided (x 10 tablets). Lot: [id number]. Exp: 10/15

## 2013-08-17 NOTE — Patient Instructions (Signed)
Begin Coumadin 2mg  daily. Recheck INR on 08/18/13 at 9:45 am for lab, 10:15 am for Treatment and 10:45am for Coumadin Clinic.

## 2013-08-18 ENCOUNTER — Telehealth: Payer: Self-pay | Admitting: Internal Medicine

## 2013-08-18 ENCOUNTER — Telehealth: Payer: Self-pay | Admitting: *Deleted

## 2013-08-18 NOTE — Telephone Encounter (Signed)
Per staff phone call and POF I have schedueld appts.  JMW  

## 2013-08-18 NOTE — Telephone Encounter (Signed)
S/w pt re appts for 1/21 and 2/2. Per desk nurse appts for 1/27 and 1/30 cx'd. Pt will get new schedule when she comes in tomorrow. Pt will also have inj 1/24.

## 2013-08-19 ENCOUNTER — Encounter: Payer: Self-pay | Admitting: Internal Medicine

## 2013-08-19 ENCOUNTER — Ambulatory Visit (HOSPITAL_BASED_OUTPATIENT_CLINIC_OR_DEPARTMENT_OTHER): Payer: Medicare Other

## 2013-08-19 ENCOUNTER — Other Ambulatory Visit: Payer: Self-pay | Admitting: Internal Medicine

## 2013-08-19 VITALS — BP 141/73 | HR 79 | Temp 98.3°F | Resp 20

## 2013-08-19 DIAGNOSIS — C8589 Other specified types of non-Hodgkin lymphoma, extranodal and solid organ sites: Secondary | ICD-10-CM

## 2013-08-19 DIAGNOSIS — R05 Cough: Secondary | ICD-10-CM

## 2013-08-19 DIAGNOSIS — Z5111 Encounter for antineoplastic chemotherapy: Secondary | ICD-10-CM

## 2013-08-19 DIAGNOSIS — C8590 Non-Hodgkin lymphoma, unspecified, unspecified site: Secondary | ICD-10-CM

## 2013-08-19 DIAGNOSIS — R059 Cough, unspecified: Secondary | ICD-10-CM

## 2013-08-19 MED ORDER — ONDANSETRON 16 MG/50ML IVPB (CHCC)
INTRAVENOUS | Status: AC
  Filled 2013-08-19: qty 16

## 2013-08-19 MED ORDER — DEXAMETHASONE SODIUM PHOSPHATE 20 MG/5ML IJ SOLN
20.0000 mg | Freq: Once | INTRAMUSCULAR | Status: AC
Start: 2013-08-19 — End: 2013-08-19
  Administered 2013-08-19: 20 mg via INTRAVENOUS

## 2013-08-19 MED ORDER — NYSTATIN 100000 UNIT/ML MT SUSP: 5.0000 mL | Freq: Four times a day (QID) | OROMUCOSAL | Status: DC | PRN

## 2013-08-19 MED ORDER — ONDANSETRON 16 MG/50ML IVPB (CHCC)
16.0000 mg | Freq: Once | INTRAVENOUS | Status: AC
  Administered 2013-08-19: 16 mg via INTRAVENOUS

## 2013-08-19 MED ORDER — RITUXIMAB CHEMO INJECTION 10 MG/ML
375.0000 mg/m2 | Freq: Once | INTRAVENOUS | Status: AC
  Administered 2013-08-19: 600 mg via INTRAVENOUS
  Filled 2013-08-19: qty 60

## 2013-08-19 MED ORDER — VINCRISTINE SULFATE CHEMO INJECTION 1 MG/ML
1.0000 mg | Freq: Once | INTRAVENOUS | Status: AC
  Administered 2013-08-19: 1 mg via INTRAVENOUS
  Filled 2013-08-19: qty 1

## 2013-08-19 MED ORDER — ACETAMINOPHEN 325 MG PO TABS
ORAL_TABLET | ORAL | Status: AC
  Filled 2013-08-19: qty 2

## 2013-08-19 MED ORDER — SODIUM CHLORIDE 0.9 % IJ SOLN
10.0000 mL | INTRAMUSCULAR | Status: DC | PRN
  Administered 2013-08-19: 10 mL
  Filled 2013-08-19: qty 10

## 2013-08-19 MED ORDER — CYCLOPHOSPHAMIDE CHEMO INJECTION 1 GM
400.0000 mg/m2 | Freq: Once | INTRAMUSCULAR | Status: AC
  Administered 2013-08-19: 640 mg via INTRAVENOUS
  Filled 2013-08-19: qty 32

## 2013-08-19 MED ORDER — ACETAMINOPHEN 325 MG PO TABS
650.0000 mg | ORAL_TABLET | Freq: Once | ORAL | Status: AC
  Administered 2013-08-19: 650 mg via ORAL

## 2013-08-19 MED ORDER — HYDROCODONE-HOMATROPINE 5-1.5 MG/5ML PO SYRP: 5.0000 mL | ORAL_SOLUTION | Freq: Four times a day (QID) | ORAL | Status: DC | PRN

## 2013-08-19 MED ORDER — HEPARIN SOD (PORK) LOCK FLUSH 100 UNIT/ML IV SOLN
500.0000 [IU] | Freq: Once | INTRAVENOUS | Status: AC | PRN
  Administered 2013-08-19: 500 [IU]
  Filled 2013-08-19: qty 5

## 2013-08-19 MED ORDER — SODIUM CHLORIDE 0.9 % IV SOLN
Freq: Once | INTRAVENOUS | Status: AC
  Administered 2013-08-19: 09:00:00 via INTRAVENOUS

## 2013-08-19 MED ORDER — DIPHENHYDRAMINE HCL 25 MG PO CAPS
ORAL_CAPSULE | ORAL | Status: AC
  Filled 2013-08-19: qty 2

## 2013-08-19 MED ORDER — DIPHENHYDRAMINE HCL 25 MG PO CAPS
50.0000 mg | ORAL_CAPSULE | Freq: Once | ORAL | Status: AC
  Administered 2013-08-19: 50 mg via ORAL

## 2013-08-19 MED ORDER — SODIUM CHLORIDE 0.9 % IV SOLN
25.0000 mg/m2 | Freq: Once | INTRAVENOUS | Status: AC
  Administered 2013-08-19: 40 mg via INTRAVENOUS
  Filled 2013-08-19: qty 2

## 2013-08-19 MED ORDER — DEXAMETHASONE SODIUM PHOSPHATE 20 MG/5ML IJ SOLN
INTRAMUSCULAR | Status: AC
  Filled 2013-08-19: qty 5

## 2013-08-19 NOTE — Patient Instructions (Signed)
[place] Discharge Instructions for Patients Receiving Chemotherapy  Today you received the following chemotherapy agents vincristine, etoposide (VP-16), cytoxan and rituxan. Make sure to pick up your etoposide from the pharmacy and take it Thursday and Friday and then return Saturday for an injection.  To help prevent nausea and vomiting after your treatment, we encourage you to take your nausea medication zofran.   If you develop nausea and vomiting that is not controlled by your nausea medication, call the clinic.   BELOW ARE SYMPTOMS THAT SHOULD BE REPORTED IMMEDIATELY:  *FEVER GREATER THAN 100.5 F  *CHILLS WITH OR WITHOUT FEVER  NAUSEA AND VOMITING THAT IS NOT CONTROLLED WITH YOUR NAUSEA MEDICATION  *UNUSUAL SHORTNESS OF BREATH  *UNUSUAL BRUISING OR BLEEDING  TENDERNESS IN MOUTH AND THROAT WITH OR WITHOUT PRESENCE OF ULCERS  *URINARY PROBLEMS  *BOWEL PROBLEMS  UNUSUAL RASH Items with * indicate a potential emergency and should be followed up as soon as possible.  Feel free to call the clinic you have any questions or concerns. The clinic phone number is [phone].

## 2013-08-20 ENCOUNTER — Other Ambulatory Visit: Payer: Self-pay | Admitting: *Deleted

## 2013-08-20 ENCOUNTER — Telehealth: Payer: Self-pay

## 2013-08-20 ENCOUNTER — Telehealth: Payer: Self-pay | Admitting: *Deleted

## 2013-08-20 NOTE — Telephone Encounter (Signed)
NO NAUSEA OR VOMITING. PT. IS EATING AND FORCING FLUIDS. NO DIARRHEA. PT. HAD A GOOD BOWEL MOVEMENT TODAY. PT. HAS HAD A PROBLEM WITH THRUSH BEFORE CHEMOTHERAPY. PT.'S MOUTH IS IMPROVING. PT. WILL CALL THIS OFFICE OR THE ON CALL PHYSICIAN IF THE OFFICE IS CLOSED SHOULD THE NEED ARISE.

## 2013-08-20 NOTE — Telephone Encounter (Signed)
S/w Mr [place]. Clarified that pt is to take 2 tablets (100 mg) today and again tomorrow. Mr parmar thanked [place] for the clarification.

## 2013-08-21 ENCOUNTER — Other Ambulatory Visit: Payer: Self-pay | Admitting: Internal Medicine

## 2013-08-21 ENCOUNTER — Telehealth: Payer: Self-pay | Admitting: Internal Medicine

## 2013-08-21 ENCOUNTER — Other Ambulatory Visit: Payer: Medicare Other

## 2013-08-21 ENCOUNTER — Encounter (HOSPITAL_COMMUNITY): Payer: Self-pay | Admitting: Emergency Medicine

## 2013-08-21 ENCOUNTER — Emergency Department (HOSPITAL_COMMUNITY): Payer: Medicare Other

## 2013-08-21 ENCOUNTER — Inpatient Hospital Stay (HOSPITAL_COMMUNITY)
Admission: EM | Admit: 2013-08-21 | Discharge: 2013-08-26 | DRG: 193 | Disposition: A | Discharge status: Home / Self Care | Payer: Medicare Other | Attending: Internal Medicine | Admitting: Internal Medicine

## 2013-08-21 ENCOUNTER — Other Ambulatory Visit: Payer: Self-pay

## 2013-08-21 ENCOUNTER — Telehealth: Payer: Self-pay

## 2013-08-21 DIAGNOSIS — Z87891 Personal history of nicotine dependence: Secondary | ICD-10-CM | POA: Diagnosis not present

## 2013-08-21 DIAGNOSIS — R509 Fever, unspecified: Secondary | ICD-10-CM

## 2013-08-21 DIAGNOSIS — M899 Disorder of bone, unspecified: Secondary | ICD-10-CM | POA: Diagnosis present

## 2013-08-21 DIAGNOSIS — D709 Neutropenia, unspecified: Secondary | ICD-10-CM | POA: Diagnosis present

## 2013-08-21 DIAGNOSIS — I5189 Other ill-defined heart diseases: Secondary | ICD-10-CM

## 2013-08-21 DIAGNOSIS — R059 Cough, unspecified: Secondary | ICD-10-CM | POA: Diagnosis not present

## 2013-08-21 DIAGNOSIS — J189 Pneumonia, unspecified organism: Secondary | ICD-10-CM

## 2013-08-21 DIAGNOSIS — M949 Disorder of cartilage, unspecified: Secondary | ICD-10-CM

## 2013-08-21 DIAGNOSIS — I2699 Other pulmonary embolism without acute cor pulmonale: Secondary | ICD-10-CM | POA: Diagnosis not present

## 2013-08-21 DIAGNOSIS — D630 Anemia in neoplastic disease: Secondary | ICD-10-CM | POA: Diagnosis present

## 2013-08-21 DIAGNOSIS — E43 Unspecified severe protein-calorie malnutrition: Secondary | ICD-10-CM | POA: Diagnosis present

## 2013-08-21 DIAGNOSIS — N390 Urinary tract infection, site not specified: Secondary | ICD-10-CM | POA: Diagnosis present

## 2013-08-21 DIAGNOSIS — I509 Heart failure, unspecified: Secondary | ICD-10-CM | POA: Diagnosis present

## 2013-08-21 DIAGNOSIS — T451X5A Adverse effect of antineoplastic and immunosuppressive drugs, initial encounter: Secondary | ICD-10-CM | POA: Diagnosis present

## 2013-08-21 DIAGNOSIS — Z806 Family history of leukemia: Secondary | ICD-10-CM | POA: Diagnosis not present

## 2013-08-21 DIAGNOSIS — I5043 Acute on chronic combined systolic (congestive) and diastolic (congestive) heart failure: Secondary | ICD-10-CM | POA: Diagnosis not present

## 2013-08-21 DIAGNOSIS — J9600 Acute respiratory failure, unspecified whether with hypoxia or hypercapnia: Secondary | ICD-10-CM | POA: Diagnosis not present

## 2013-08-21 DIAGNOSIS — E876 Hypokalemia: Secondary | ICD-10-CM | POA: Diagnosis present

## 2013-08-21 DIAGNOSIS — D500 Iron deficiency anemia secondary to blood loss (chronic): Secondary | ICD-10-CM | POA: Diagnosis present

## 2013-08-21 DIAGNOSIS — D6959 Other secondary thrombocytopenia: Secondary | ICD-10-CM | POA: Diagnosis present

## 2013-08-21 DIAGNOSIS — I2782 Chronic pulmonary embolism: Secondary | ICD-10-CM | POA: Diagnosis present

## 2013-08-21 DIAGNOSIS — B370 Candidal stomatitis: Secondary | ICD-10-CM | POA: Diagnosis present

## 2013-08-21 DIAGNOSIS — R079 Chest pain, unspecified: Secondary | ICD-10-CM | POA: Diagnosis not present

## 2013-08-21 DIAGNOSIS — D638 Anemia in other chronic diseases classified elsewhere: Secondary | ICD-10-CM | POA: Diagnosis present

## 2013-08-21 DIAGNOSIS — I5033 Acute on chronic diastolic (congestive) heart failure: Secondary | ICD-10-CM

## 2013-08-21 DIAGNOSIS — Z7901 Long term (current) use of anticoagulants: Secondary | ICD-10-CM | POA: Diagnosis not present

## 2013-08-21 DIAGNOSIS — R112 Nausea with vomiting, unspecified: Secondary | ICD-10-CM | POA: Diagnosis not present

## 2013-08-21 DIAGNOSIS — Z833 Family history of diabetes mellitus: Secondary | ICD-10-CM | POA: Diagnosis not present

## 2013-08-21 DIAGNOSIS — Z9221 Personal history of antineoplastic chemotherapy: Secondary | ICD-10-CM

## 2013-08-21 DIAGNOSIS — D62 Acute posthemorrhagic anemia: Secondary | ICD-10-CM | POA: Diagnosis present

## 2013-08-21 DIAGNOSIS — D6481 Anemia due to antineoplastic chemotherapy: Secondary | ICD-10-CM | POA: Diagnosis not present

## 2013-08-21 DIAGNOSIS — R1310 Dysphagia, unspecified: Secondary | ICD-10-CM

## 2013-08-21 DIAGNOSIS — J1100 Influenza due to unidentified influenza virus with unspecified type of pneumonia: Secondary | ICD-10-CM | POA: Diagnosis not present

## 2013-08-21 DIAGNOSIS — I10 Essential (primary) hypertension: Secondary | ICD-10-CM | POA: Diagnosis present

## 2013-08-21 DIAGNOSIS — Z8249 Family history of ischemic heart disease and other diseases of the circulatory system: Secondary | ICD-10-CM | POA: Diagnosis not present

## 2013-08-21 DIAGNOSIS — IMO0002 Reserved for concepts with insufficient information to code with codable children: Secondary | ICD-10-CM

## 2013-08-21 DIAGNOSIS — F29 Unspecified psychosis not due to a substance or known physiological condition: Secondary | ICD-10-CM | POA: Diagnosis present

## 2013-08-21 DIAGNOSIS — R0602 Shortness of breath: Secondary | ICD-10-CM | POA: Diagnosis not present

## 2013-08-21 DIAGNOSIS — J9601 Acute respiratory failure with hypoxia: Secondary | ICD-10-CM

## 2013-08-21 DIAGNOSIS — C8590 Non-Hodgkin lymphoma, unspecified, unspecified site: Secondary | ICD-10-CM | POA: Diagnosis present

## 2013-08-21 DIAGNOSIS — D696 Thrombocytopenia, unspecified: Secondary | ICD-10-CM | POA: Diagnosis present

## 2013-08-21 DIAGNOSIS — I519 Heart disease, unspecified: Secondary | ICD-10-CM | POA: Diagnosis not present

## 2013-08-21 DIAGNOSIS — I5042 Chronic combined systolic (congestive) and diastolic (congestive) heart failure: Secondary | ICD-10-CM | POA: Diagnosis present

## 2013-08-21 DIAGNOSIS — C8589 Other specified types of non-Hodgkin lymphoma, extranodal and solid organ sites: Secondary | ICD-10-CM | POA: Diagnosis present

## 2013-08-21 DIAGNOSIS — Z86711 Personal history of pulmonary embolism: Secondary | ICD-10-CM | POA: Diagnosis present

## 2013-08-21 DIAGNOSIS — R0789 Other chest pain: Secondary | ICD-10-CM | POA: Diagnosis present

## 2013-08-21 DIAGNOSIS — R05 Cough: Secondary | ICD-10-CM | POA: Diagnosis present

## 2013-08-21 DIAGNOSIS — R51 Headache: Secondary | ICD-10-CM | POA: Diagnosis not present

## 2013-08-21 DIAGNOSIS — B961 Klebsiella pneumoniae [K. pneumoniae] as the cause of diseases classified elsewhere: Secondary | ICD-10-CM | POA: Diagnosis present

## 2013-08-21 LAB — CBC WITH DIFFERENTIAL/PLATELET
Basophils Absolute: 0 10*3/uL (ref 0.0–0.1)
Basophils Relative: 0 % (ref 0–1)
EOS ABS: 0 10*3/uL (ref 0.0–0.7)
EOS PCT: 0 % (ref 0–5)
HEMATOCRIT: 30.5 % — AB (ref 36.0–46.0)
HEMOGLOBIN: 10.1 g/dL — AB (ref 12.0–15.0)
LYMPHS ABS: 0.3 10*3/uL — AB (ref 0.7–4.0)
LYMPHS PCT: 5 % — AB (ref 12–46)
MCH: 30.8 pg (ref 26.0–34.0)
MCHC: 33.1 g/dL (ref 30.0–36.0)
MCV: 93 fL (ref 78.0–100.0)
MONO ABS: 0.3 10*3/uL (ref 0.1–1.0)
MONOS PCT: 4 % (ref 3–12)
Neutro Abs: 6.3 10*3/uL (ref 1.7–7.7)
Neutrophils Relative %: 91 % — ABNORMAL HIGH (ref 43–77)
PLATELETS: 84 10*3/uL — AB (ref 150–400)
RBC: 3.28 MIL/uL — AB (ref 3.87–5.11)
RDW: 15.6 % — ABNORMAL HIGH (ref 11.5–15.5)
WBC: 6.9 10*3/uL (ref 4.0–10.5)

## 2013-08-21 LAB — COMPREHENSIVE METABOLIC PANEL
ALT: 8 U/L (ref 0–35)
AST: 20 U/L (ref 0–37)
Albumin: 3 g/dL — ABNORMAL LOW (ref 3.5–5.2)
Alkaline Phosphatase: 59 U/L (ref 39–117)
BUN: 10 mg/dL (ref 6–23)
CALCIUM: 8.4 mg/dL (ref 8.4–10.5)
CO2: 24 meq/L (ref 19–32)
Chloride: 104 mEq/L (ref 96–112)
Creatinine, Ser: 0.94 mg/dL (ref 0.50–1.10)
GFR, EST AFRICAN AMERICAN: 69 mL/min — AB (ref >90)
GFR, EST NON AFRICAN AMERICAN: 59 mL/min — AB (ref >90)
GLUCOSE: 98 mg/dL (ref 70–99)
Potassium: 3.7 mEq/L (ref 3.7–5.3)
SODIUM: 140 meq/L (ref 137–147)
Total Bilirubin: 0.6 mg/dL (ref 0.3–1.2)
Total Protein: 5.3 g/dL — ABNORMAL LOW (ref 6.0–8.3)

## 2013-08-21 LAB — URINALYSIS, ROUTINE W REFLEX MICROSCOPIC
Bilirubin Urine: NEGATIVE
Glucose, UA: NEGATIVE mg/dL
Hgb urine dipstick: NEGATIVE
KETONES UR: NEGATIVE mg/dL
Leukocytes, UA: NEGATIVE
NITRITE: NEGATIVE
PH: 6 (ref 5.0–8.0)
Protein, ur: NEGATIVE mg/dL
Specific Gravity, Urine: 1.016 (ref 1.005–1.030)
Urobilinogen, UA: 0.2 mg/dL (ref 0.0–1.0)

## 2013-08-21 LAB — TSH: TSH: 1.925 u[IU]/mL (ref 0.350–4.500)

## 2013-08-21 LAB — STREP PNEUMONIAE URINARY ANTIGEN: STREP PNEUMO URINARY ANTIGEN: NEGATIVE

## 2013-08-21 LAB — CG4 I-STAT (LACTIC ACID): Lactic Acid, Venous: 0.79 mmol/L (ref 0.5–2.2)

## 2013-08-21 LAB — PROTIME-INR
INR: 1.83 — ABNORMAL HIGH (ref 0.00–1.49)
PROTHROMBIN TIME: 20.6 s — AB (ref 11.6–15.2)

## 2013-08-21 LAB — PRO B NATRIURETIC PEPTIDE: Pro B Natriuretic peptide (BNP): 21795 pg/mL — ABNORMAL HIGH (ref 0–125)

## 2013-08-21 LAB — HIV ANTIBODY (ROUTINE TESTING W REFLEX): HIV: NONREACTIVE

## 2013-08-21 MED ORDER — SODIUM CHLORIDE 0.9 % IV SOLN
INTRAVENOUS | Status: DC
  Administered 2013-08-21: 16:00:00 via INTRAVENOUS

## 2013-08-21 MED ORDER — LOSARTAN POTASSIUM 25 MG PO TABS
25.0000 mg | ORAL_TABLET | Freq: Two times a day (BID) | ORAL | Status: DC
Start: 2013-08-21 — End: 2013-08-26
  Administered 2013-08-21 – 2013-08-26 (×10): 25 mg via ORAL
  Filled 2013-08-21 (×12): qty 1

## 2013-08-21 MED ORDER — HYDROCODONE-HOMATROPINE 5-1.5 MG/5ML PO SYRP
5.0000 mL | ORAL_SOLUTION | Freq: Four times a day (QID) | ORAL | Status: DC | PRN
  Administered 2013-08-21 – 2013-08-25 (×10): 5 mL via ORAL
  Filled 2013-08-21 (×10): qty 5

## 2013-08-21 MED ORDER — VALACYCLOVIR HCL 500 MG PO TABS
500.0000 mg | ORAL_TABLET | Freq: Every day | ORAL | Status: DC
  Administered 2013-08-22 – 2013-08-26 (×5): 500 mg via ORAL
  Filled 2013-08-21 (×5): qty 1

## 2013-08-21 MED ORDER — VANCOMYCIN HCL 500 MG IV SOLR
500.0000 mg | Freq: Two times a day (BID) | INTRAVENOUS | Status: DC
  Administered 2013-08-22 – 2013-08-26 (×9): 500 mg via INTRAVENOUS
  Filled 2013-08-21 (×10): qty 500

## 2013-08-21 MED ORDER — CEFEPIME HCL 2 G IJ SOLR: 2.0000 g | INTRAMUSCULAR | Status: DC

## 2013-08-21 MED ORDER — DEXTROSE 5 % IV SOLN
1.0000 g | Freq: Two times a day (BID) | INTRAVENOUS | Status: DC
  Administered 2013-08-22: 1 g via INTRAVENOUS
  Filled 2013-08-21 (×3): qty 1

## 2013-08-21 MED ORDER — ALBUTEROL SULFATE (2.5 MG/3ML) 0.083% IN NEBU: 2.5000 mg | INHALATION_SOLUTION | RESPIRATORY_TRACT | Status: DC | PRN

## 2013-08-21 MED ORDER — SODIUM CHLORIDE 0.9 % IV BOLUS (SEPSIS)
500.0000 mL | Freq: Once | INTRAVENOUS | Status: AC
  Administered 2013-08-21: 500 mL via INTRAVENOUS

## 2013-08-21 MED ORDER — WARFARIN - PHARMACIST DOSING INPATIENT
Freq: Every day | Status: DC
  Administered 2013-08-22: 18:00:00

## 2013-08-21 MED ORDER — VANCOMYCIN HCL IN DEXTROSE 1-5 GM/200ML-% IV SOLN
1000.0000 mg | Freq: Once | INTRAVENOUS | Status: AC
  Administered 2013-08-21: 1000 mg via INTRAVENOUS
  Filled 2013-08-21: qty 200

## 2013-08-21 MED ORDER — WARFARIN SODIUM 1 MG PO TABS
1.0000 mg | ORAL_TABLET | Freq: Once | ORAL | Status: AC
  Administered 2013-08-21: 1 mg via ORAL
  Filled 2013-08-21: qty 1

## 2013-08-21 MED ORDER — SODIUM CHLORIDE 0.9 % IV SOLN: INTRAVENOUS | Status: DC

## 2013-08-21 MED ORDER — PANTOPRAZOLE SODIUM 40 MG PO TBEC
40.0000 mg | DELAYED_RELEASE_TABLET | Freq: Every day | ORAL | Status: DC
  Administered 2013-08-21 – 2013-08-26 (×6): 40 mg via ORAL
  Filled 2013-08-21 (×6): qty 1

## 2013-08-21 MED ORDER — SODIUM CHLORIDE 0.9 % IV SOLN
INTRAVENOUS | Status: AC
  Administered 2013-08-22: 03:00:00 via INTRAVENOUS

## 2013-08-21 MED ORDER — POTASSIUM CHLORIDE CRYS ER 10 MEQ PO TBCR
10.0000 meq | EXTENDED_RELEASE_TABLET | Freq: Every day | ORAL | Status: DC
Start: 2013-08-21 — End: 2013-08-26
  Administered 2013-08-22 – 2013-08-26 (×5): 10 meq via ORAL
  Filled 2013-08-21 (×5): qty 1

## 2013-08-21 MED ORDER — GUAIFENESIN ER 600 MG PO TB12
600.0000 mg | ORAL_TABLET | Freq: Two times a day (BID) | ORAL | Status: DC
  Administered 2013-08-21 – 2013-08-26 (×10): 600 mg via ORAL
  Filled 2013-08-21 (×13): qty 1

## 2013-08-21 MED ORDER — BISOPROLOL FUMARATE 10 MG PO TABS
10.0000 mg | ORAL_TABLET | Freq: Every day | ORAL | Status: DC
  Administered 2013-08-22 – 2013-08-26 (×5): 10 mg via ORAL
  Filled 2013-08-21 (×5): qty 1

## 2013-08-21 MED ORDER — DEXTROSE 5 % IV SOLN
2.0000 g | Freq: Once | INTRAVENOUS | Status: AC
  Administered 2013-08-21: 2 g via INTRAVENOUS
  Filled 2013-08-21: qty 2

## 2013-08-21 MED ORDER — ONDANSETRON HCL 4 MG PO TABS: 4.0000 mg | ORAL_TABLET | Freq: Four times a day (QID) | ORAL | Status: DC | PRN

## 2013-08-21 MED ORDER — VANCOMYCIN HCL 500 MG IV SOLR: 500.0000 mg | Freq: Two times a day (BID) | INTRAVENOUS | Status: DC

## 2013-08-21 MED ORDER — NYSTATIN 100000 UNIT/ML MT SUSP
5.0000 mL | Freq: Four times a day (QID) | OROMUCOSAL | Status: DC | PRN
  Filled 2013-08-21: qty 5

## 2013-08-21 MED ORDER — ACETAMINOPHEN 325 MG PO TABS
650.0000 mg | ORAL_TABLET | Freq: Once | ORAL | Status: AC
  Administered 2013-08-21: 650 mg via ORAL
  Filled 2013-08-21: qty 2

## 2013-08-21 MED ORDER — GUAIFENESIN 100 MG/5ML PO SYRP
200.0000 mg | ORAL_SOLUTION | ORAL | Status: DC | PRN
  Administered 2013-08-22 (×2): 200 mg via ORAL
  Filled 2013-08-21: qty 10

## 2013-08-21 NOTE — ED Provider Notes (Signed)
CSN: [id number]     Arrival date & time 08/21/13  1402 History   First MD Initiated Contact with Patient 08/21/13 1453     Chief Complaint  Patient presents with  . Cough  . Headache   (Consider location/radiation/quality/duration/timing/severity/associated sxs/prior Treatment) Patient is a 73 y.o. female presenting with cough and headaches. The history is provided by the patient.  Cough Cough characteristics:  Non-productive Severity:  Moderate Onset quality:  Sudden Duration:  1 day Timing:  Constant Progression:  Unchanged Chronicity:  Recurrent Context comment:  URI Relieved by:  Nothing Worsened by:  Nothing tried Ineffective treatments:  None tried Associated symptoms: fever, headaches, shortness of breath and wheezing   Associated symptoms: no chest pain   Headache Associated symptoms: cough, fever, nausea and vomiting (twice this morning)   Associated symptoms: no abdominal pain, no back pain, no congestion, no diarrhea, no dizziness, no pain, no fatigue and no neck pain     Past Medical History  Diagnosis Date  . Hypertension   . Renal insufficiency   . Osteopenia   . Allergy   . Heart murmur   . GERD (gastroesophageal reflux disease)   . Anemia   . Hypercalcemia 03/27/2013  . Non-Hodgkins lymphoma 04/06/2013  . Diastolic dysfunction, grade I 04/12/2013   Past Surgical History  Procedure Laterality Date  . Appendectomy    . Colonoscopy  2008  . Spine surgery    . Back surgery      LOWER BACK TWICE  . Abdominal hysterectomy    . Esophagogastroduodenoscopy N/A 03/29/2013    Procedure: ESOPHAGOGASTRODUODENOSCOPY (EGD);  Surgeon: Juanita Craver, MD;  Location: [facility] ENDOSCOPY;  Service: Endoscopy;  Laterality: N/A;  . Inguinal lymph node biopsy Right 03/31/2013    Procedure: INGUINAL LYMPH NODE BIOPSY;  Surgeon: Gwenyth Ober, MD;  Location: [place];  Service: General;  Laterality: Right;   Family History  Problem Relation Age of Onset  . Heart attack Father 58  .  Leukemia Mother   . Diabetes Brother   . Diabetes Sister    History  Substance Use Topics  . Smoking status: Former Smoker -- 0.50 packs/day for 2 years    Types: Cigarettes    Quit date: 07/30/1960  . Smokeless tobacco: Never Used  . Alcohol Use: Yes     Comment: rare-wine drinker   OB History   Grav Para Term Preterm Abortions TAB SAB Ect Mult Living                 Review of Systems  Constitutional: Positive for fever. Negative for fatigue.  HENT: Negative for congestion and drooling.   Eyes: Negative for pain.  Respiratory: Positive for cough, shortness of breath and wheezing.   Cardiovascular: Negative for chest pain.  Gastrointestinal: Positive for nausea and vomiting (twice this morning). Negative for abdominal pain and diarrhea.  Genitourinary: Negative for dysuria and hematuria.  Musculoskeletal: Negative for back pain, gait problem and neck pain.  Skin: Negative for color change.  Neurological: Positive for headaches. Negative for dizziness.  Hematological: Negative for adenopathy.  Psychiatric/Behavioral: Negative for behavioral problems.  All other systems reviewed and are negative.    Allergies  Penicillins  Home Medications   Current Outpatient Rx  Name  Route  Sig  Dispense  Refill  . bisoprolol (ZEBETA) 10 MG tablet   Oral   Take 1 tablet by mouth daily.         Marland Kitchen HYDROcodone-homatropine (HYCODAN) 5-1.5 MG/5ML syrup   Oral  Take 5 mLs by mouth every 6 (six) hours as needed for cough.   240 mL   0   . KLOR-CON M10 10 MEQ tablet   Oral   Take 1 tablet by mouth daily.         Marland Kitchen lidocaine-prilocaine (EMLA) cream   Topical   Apply 1 application topically as needed. Apply to port site one hour before treatment and cover with plastic wrap.   30 g   3   . losartan (COZAAR) 25 MG tablet   Oral   Take 1 tablet (25 mg total) by mouth 2 (two) times daily.   180 tablet   3   . Multiple Vitamins-Minerals (MULTIVITAMIN WITH MINERALS) tablet    Oral   Take 1 tablet by mouth daily.         Marland Kitchen nystatin (MYCOSTATIN) 100000 UNIT/ML suspension   Oral   Take 5 mLs (500,000 Units total) by mouth 4 (four) times daily as needed. Swish and swallow  Or  Swish and spit   480 mL   0   . omeprazole (PRILOSEC) 40 MG capsule   Oral   Take 1 capsule by mouth daily.         . ondansetron (ZOFRAN) 4 MG tablet   Oral   Take 1 tablet (4 mg total) by mouth every 6 (six) hours as needed for nausea.   20 tablet   0   . valACYclovir (VALTREX) 500 MG tablet   Oral   Take 1 tablet (500 mg total) by mouth daily.   60 tablet   1   . warfarin (COUMADIN) 2 MG tablet   Oral   Take 2 mg by mouth daily.         Marland Kitchen albuterol (PROVENTIL) (5 MG/ML) 0.5% nebulizer solution   Nebulization   Take 0.5 mLs (2.5 mg total) by nebulization every 4 (four) hours as needed for wheezing or shortness of breath.   20 mL   12    BP 147/80  Pulse 103  Temp(Src) 100.4 F (38 C) (Oral)  Resp 16  SpO2 90% Physical Exam  Nursing note and vitals reviewed. Constitutional: She is oriented to person, place, and time. She appears well-developed and well-nourished.  HENT:  Head: Normocephalic.  Mouth/Throat: Oropharynx is clear and moist. No oropharyngeal exudate.  Eyes: Conjunctivae and EOM are normal. Pupils are equal, round, and reactive to light.  Neck: Normal range of motion. Neck supple.  Cardiovascular: Regular rhythm, normal heart sounds and intact distal pulses.  Exam reveals no gallop and no friction rub.   No murmur heard. HR 103  Pulmonary/Chest: Effort normal and breath sounds normal. No respiratory distress. She has no wheezes.  Abdominal: Soft. Bowel sounds are normal. There is no tenderness. There is no rebound and no guarding.  Musculoskeletal: Normal range of motion. She exhibits no edema and no tenderness.  Neurological: She is alert and oriented to person, place, and time.  Skin: Skin is warm and dry.  Psychiatric: She has a normal mood  and affect. Her behavior is normal.    ED Course  Procedures (including critical care time) Labs Review Labs Reviewed  CBC WITH DIFFERENTIAL - Abnormal; Notable for the following:    RBC 3.28 (*)    Hemoglobin 10.1 (*)    HCT 30.5 (*)    RDW 15.6 (*)    Platelets 84 (*)    Neutrophils Relative % 91 (*)    Lymphocytes Relative 5 (*)  Lymphs Abs 0.3 (*)    All other components within normal limits  COMPREHENSIVE METABOLIC PANEL - Abnormal; Notable for the following:    Total Protein 5.3 (*)    Albumin 3.0 (*)    GFR calc non Af Amer 59 (*)    GFR calc Af Amer 69 (*)    All other components within normal limits  PROTIME-INR - Abnormal; Notable for the following:    Prothrombin Time 20.6 (*)    INR 1.83 (*)    All other components within normal limits  CULTURE, BLOOD (ROUTINE X 2)  CULTURE, BLOOD (ROUTINE X 2)  URINE CULTURE  URINALYSIS, ROUTINE W REFLEX MICROSCOPIC  CG4 I-STAT (LACTIC ACID)   Imaging Review Dg Chest 2 View  08/21/2013   CLINICAL DATA:  Pain.  Shortness of breath.  EXAM: CHEST  2 VIEW  COMPARISON:  PET-CT 06/16/2013.  Chest radiograph 04/25/2013.  FINDINGS: Right-sided Port-A-Cath remains in place with tip overlying the lower SVC, unchanged. Cardiomediastinal silhouette is within normal limits. There are new/ increased patchy opacities within the right middle lobe. There is also a new opacity in the left and possibly right lower lobes. No pleural effusion is identified. There is no pneumothorax. Posterior lumbar fixation hardware is partially visualized.  IMPRESSION: New/ increased opacity in the right middle lobe as well as left and possibly right lower lobes. Findings may reflect multifocal pneumonia and/or aspiration.   Electronically Signed   By: Logan Bores   On: 08/21/2013 15:19    EKG Interpretation   None       MDM   1. HCAP (healthcare-associated pneumonia)   2. Acute on chronic diastolic heart failure   3. Acute pulmonary embolism    3:18 PM  73 y.o. female with a history of non-Hodgkin's lymphoma status post chemotherapy which was last performed yesterday, diastolic dysfunction, PE on Coumadin who presents with cough and shortness of breath. The patient notes that she has had a chronic cough which seemed to acutely worsened yesterday evening. She notes that she was producing green sputum a week ago but now the cough is dry. She had a temperature of 99 yesterday evening. She is also developed a mild 5/10 headache in her bilateral temporal area which is worse with coughing. She also notes mild worsening of chronic low back pain which is worse with coughing as well. She is febrile here and mildly tachycardic. Will treat headache and fever with Tylenol, IV fluid, and screening labs. Patient currently denies any shortness of breath.  CXR concerning for pna. Will tx for HCAP given current chemotherapy. Will give Vanc/Cefepime. Pt also has borderline low O2 sat on RA. Will admit to hospitalist.    Blanchard Kelch, MD 08/21/13 2004

## 2013-08-21 NOTE — Telephone Encounter (Signed)
Husband called stating pt is coughing more. Pt heard in background. Strong nonproductive wheezing cough. Almost continuous. Pt had thrown up earlier. Low grade temp 99.9. Then husband gave her some tylenol. Took both VP 16 pills yesterday, has only taken 1 VP 16 pill today with plan for next one later in day. No apetite but has been drinking water. Dr Juliann Mule informed. Dr Juliann Mule called pt back about 130 and directed her to the ER.

## 2013-08-21 NOTE — ED Notes (Addendum)
Pt c/o cough, fever, and headache starting last night.  Sts her back has started hurting and Hx of chronic back pain.  Pain score 7/10.  Pt sts she had new medication in her chemo treatment yesterday.  Pt took 2 Tylenol around noon for fever.

## 2013-08-21 NOTE — Progress Notes (Addendum)
ANTIBIOTIC CONSULT NOTE - INITIAL  Pharmacy Consult for Vanco/Cefepime/Warfarin Indication: HCAP/sepsis/PE  Allergies  Allergen Reactions  . Penicillins Rash    Patient Measurements: TBW= 54.6 kg (08/12/13) HT= 157 cm    Vital Signs: Temp: 100.4 F (38 C) (01/23 1427) Temp src: Oral (01/23 1427) BP: 147/80 mmHg (01/23 1427) Pulse Rate: 103 (01/23 1427) Intake/Output from previous day:   Intake/Output from this shift:    Labs: No results found for this basename: WBC, HGB, PLT, LABCREA, CREATININE,  in the last 72 hours The CrCl is unknown because both a height and weight (above a minimum accepted value) are required for this calculation. No results found for this basename: VANCOTROUGH, VANCOPEAK, VANCORANDOM, GENTTROUGH, GENTPEAK, GENTRANDOM, TOBRATROUGH, TOBRAPEAK, TOBRARND, AMIKACINPEAK, AMIKACINTROU, AMIKACIN,  in the last 72 hours   Microbiology: No results found for this or any previous visit (from the past 720 hour(s)).  Medical History:  Past Medical History  Diagnosis Date  . Hypertension   . Renal insufficiency   . Osteopenia   . Allergy   . Heart murmur   . GERD (gastroesophageal reflux disease)   . Anemia   . Hypercalcemia 03/27/2013  . Non-Hodgkins lymphoma 04/06/2013  . Diastolic dysfunction, grade I 04/12/2013    Medications:  Scheduled:   Infusions:  . sodium chloride 125 mL/hr at 08/21/13 1538  . vancomycin 1,000 mg (08/21/13 1634)   PRN:  Assessment: 73 yo female with h/o non Hodgkin's lymphoma s/p chemotherapy done yesterday presenting with cough and shortness of breath. Vanco and Cefepime to be started for sepsis/HCAP. Pt received Cefepime 2gm and Vanco 1 gm in ER. Scr=.94 with est CLcr (CG)48 and normalized =60  Goal of Therapy:  Vancomycin trough level 15-20 mcg/ml  Plan:  . Cefepime 2 gm Q24h starting tomorrow (first dose given in ER) . Start Vanco 500mg  IV Q12h after 1gm in ER. . Will f/u Blood Cx, Urine Cx and Scr . Will check  Vanco trough level at steady state  Garnet Sierras, [place] D. 08/21/2013,3:52 PM  Addendum: [phone]) Warfarin to resume while in hospital. Pt being followed by [facility] for indication of PE with INR goal of 2-3. Pt was on Coumadin 2mg  daily with last dose today at 1230. INR=1.83 today. Will give additional 1mg  po x 1 tonight and f/u daily INR.  Additionally, Cefepime was first ordered for sepsis. CXray -may reflect PNA and/or aspiration PNA. Will change Cefepime to 1 gm Q12h for PNA indication.  Garnet Sierras, [place] D. 08/21/2013

## 2013-08-21 NOTE — Telephone Encounter (Signed)
Nurse reported that patient had profound cough.  I spoke with her husband at her contact number.  He states she was doing fine until this morning and had one episode of emesis.  She was coughing quite a lot he reported.  I could hear her coughing vigorously in the background.  PLAN: I instructed her to report to the emergency now for further evaluation.   If she is short of breath, they could call EMS.  I am concerned about aspiration.  Husband voiced agreement with plan.

## 2013-08-21 NOTE — Progress Notes (Signed)
Utilization Review completed.  Theresa Wedel RN CM  

## 2013-08-21 NOTE — H&P (Signed)
[facility] History and Physical  WAVE POCOCK [id number] DOB: 03-Aug-1940 DOA: 08/21/2013  Referring physician: ED physician PCP: [name], MD   Chief Complaint: shortness of breath with cough   HPI:  Tonya Wise is 73 yo female with NHL, under the care of Dr. Juliann Mule, combined diastolic and systolic CHF, grade II, EF 40%, who presented to [facility] ED with main concern of several days duration of progressively worsening shortness of breath associated with non productive cough, subjective fevers, chills, malaise, poor oral intake. Tonya Wise denies similar events in the past, no recent sick contacts or exposures. Tonya Wise also reports no specific alleviating or aggravating factors, no other systemic symptoms. No specific abdominal or urinary concerns, no focal neurological symptoms.   In ED, Tonya Wise noted to have oxygen saturation 90% on RA, CXR findings worrisome for PNA. TR[facility] asked to admit for further evaluation. Dr. Juliann Mule notified us of plan to hold of on chemo for now in the setting of an acute infection  Assessment and Plan: Acute hypoxic respiratory failure - likely secondary to HCAP - will ask for influenza by PCR, respiratory viral panel  - obtain sputum analysis, culture, strep pneumo and urine legionella - place on Vancomycin and Maxipime for now - provide oxygen for supportive care - please note that Tonya Wise has history of combined systolic and diastolic CHG, grade II with EF 40 % per last 2  D ECHO - gentle hydration with NS 50 cc.hr  HCAP - cover with broad spectrum ABX Vancomycin and Maxipime - BD as needed - provide oxygen for now as oxygen saturation in RA 90% Acute on chronic blood loss anemia - Hg overall stable, no signs of active bleeding - CBC in AM Thrombocytopenia  - if persistent drop noted with consider holding Lovenox - no signs of acute bleed  PE - continue Coumadin per pharmacy but close attention to platelets - if Plt drop < 50 we may need to reconsider continuation of  Coumadin  HTN - continue bisoprolol, losartan  Combined systolic and diastolic CHF - careful hydration with NS @ 50 cc/hr - daily weights and I's and O's  Code Status: Full Family Communication: Tonya Wise at bedside Disposition Plan: admit to medical floor   Review of Systems:  Constitutional:  Negative for diaphoresis.  HENT: Negative for hearing loss, ear pain, nosebleeds, congestion, sore throat, neck pain, tinnitus and ear discharge.   Eyes: Negative for blurred vision, double vision, photophobia, pain, discharge and redness.  Respiratory: Per HPI Cardiovascular: Negative for claudication and leg swelling.  Gastrointestinal: Negative for nausea, vomiting and abdominal pain. Negative for heartburn, constipation, blood in stool and melena.  Genitourinary: Negative for dysuria, urgency, frequency, hematuria and flank pain.  Musculoskeletal: Negative for myalgias, back pain, joint pain and falls.  Skin: Negative for itching and rash.  Neurological: Negative for tingling, tremors, sensory change, speech change, focal weakness, loss of consciousness and headaches.  Endo/Heme/Allergies: Negative for environmental allergies and polydipsia. Does not bruise/bleed easily.  Psychiatric/Behavioral: Negative for suicidal ideas. The patient is not nervous/anxious.      Past Medical History  Diagnosis Date  . Hypertension   . Renal insufficiency   . Osteopenia   . Allergy   . Heart murmur   . GERD (gastroesophageal reflux disease)   . Anemia   . Hypercalcemia 03/27/2013  . Non-Hodgkins lymphoma 04/06/2013  . Diastolic dysfunction, grade I 04/12/2013    Past Surgical History  Procedure Laterality Date  . Appendectomy    . Colonoscopy  2008  . Spine surgery    . Back surgery      LOWER BACK TWICE  . Abdominal hysterectomy    . Esophagogastroduodenoscopy N/A 03/29/2013    Procedure: ESOPHAGOGASTRODUODENOSCOPY (EGD);  Surgeon: Juanita Craver, MD;  Location: [facility] ENDOSCOPY;  Service: Endoscopy;   Laterality: N/A;  . Inguinal lymph node biopsy Right 03/31/2013    Procedure: INGUINAL LYMPH NODE BIOPSY;  Surgeon: Gwenyth Ober, MD;  Location: [place];  Service: General;  Laterality: Right;    Social History:  reports that she quit smoking about 53 years ago. Her smoking use included Cigarettes. She has a 1 pack-year smoking history. She has never used smokeless tobacco. She reports that she drinks alcohol. She reports that she does not use illicit drugs.  Allergies  Allergen Reactions  . Penicillins Rash    Family History  Problem Relation Age of Onset  . Heart attack Father 34  . Leukemia Mother   . Diabetes Brother   . Diabetes Sister     Prior to Admission medications   Medication Sig Start Date End Date Taking? Authorizing Provider  bisoprolol (ZEBETA) 10 MG tablet Take 1 tablet by mouth daily. 05/20/13  Yes Historical Provider, MD  HYDROcodone-homatropine (HYCODAN) 5-1.5 MG/5ML syrup Take 5 mLs by mouth every 6 (six) hours as needed for cough. 08/19/13  Yes Concha Norway, MD  KLOR-CON M10 10 MEQ tablet Take 1 tablet by mouth daily. 07/27/13  Yes Historical Provider, MD  lidocaine-prilocaine (EMLA) cream Apply 1 application topically as needed. Apply to port site one hour before treatment and cover with plastic wrap. 06/22/13  Yes Concha Norway, MD  losartan (COZAAR) 25 MG tablet Take 1 tablet (25 mg total) by mouth 2 (two) times daily. 08/12/13  Yes Troy Sine, MD  Multiple Vitamins-Minerals (MULTIVITAMIN WITH MINERALS) tablet Take 1 tablet by mouth daily.   Yes Historical Provider, MD  nystatin (MYCOSTATIN) 100000 UNIT/ML suspension Take 5 mLs (500,000 Units total) by mouth 4 (four) times daily as needed. Swish and swallow  Or  Swish and spit 08/19/13  Yes Concha Norway, MD  omeprazole (PRILOSEC) 40 MG capsule Take 1 capsule by mouth daily. 06/13/13  Yes Historical Provider, MD  ondansetron (ZOFRAN) 4 MG tablet Take 1 tablet (4 mg total) by mouth every 6 (six) hours as needed for  nausea. 07/20/13  Yes Chauncey Cruel, MD  valACYclovir (VALTREX) 500 MG tablet Take 1 tablet (500 mg total) by mouth daily. 06/22/13  Yes Concha Norway, MD  warfarin (COUMADIN) 2 MG tablet Take 2 mg by mouth daily.   Yes Historical Provider, MD  albuterol (PROVENTIL) (5 MG/ML) 0.5% nebulizer solution Take 0.5 mLs (2.5 mg total) by nebulization every 4 (four) hours as needed for wheezing or shortness of breath. 04/29/13   Robbie Lis, MD    Physical Exam: Filed Vitals:   08/21/13 1427 08/21/13 1429 08/21/13 1714 08/21/13 1715  BP: 147/80  128/59   Pulse: 103     Temp: 100.4 F (38 C)  101 F (38.3 C)   TempSrc: Oral  Oral   Resp: 16  24   SpO2: 90% 90% 89% 96%    Physical Exam  Constitutional: Appears well-developed and well-nourished. No distress.  HENT: Normocephalic. External right and left ear normal. Dry MM Eyes: Conjunctivae and EOM are normal. PERRLA, no scleral icterus.  Neck: Normal ROM. Neck supple. No JVD. No tracheal deviation. No thyromegaly.  CVS: RRR, S1/S2 +, no murmurs, no gallops, no carotid bruit.  Pulmonary: Rhonchi bilaterally but worse on the right side, no tachypnea, no wheezing  Abdominal: Soft. BS +,  no distension, tenderness, rebound or guarding.  Musculoskeletal: Normal range of motion. No edema and no tenderness.  Lymphadenopathy: No lymphadenopathy noted, cervical, inguinal. Neuro: Alert. Normal reflexes, muscle tone coordination. No cranial nerve deficit. Skin: Skin is warm and dry. No rash noted. Not diaphoretic. No erythema. No pallor.  Psychiatric: Normal mood and affect. Behavior, judgment, thought content normal.   Labs on Admission:  Basic Metabolic Panel:  Recent Labs Lab 08/21/13 1535  NA 140  K 3.7  CL 104  CO2 24  GLUCOSE 98  BUN 10  CREATININE 0.94  CALCIUM 8.4   Liver Function Tests:  Recent Labs Lab 08/21/13 1535  AST 20  ALT 8  ALKPHOS 59  BILITOT 0.6  PROT 5.3*  ALBUMIN 3.0*   CBC:  Recent Labs Lab  08/17/13 1106 08/21/13 1535  WBC 4.5 6.9  NEUTROABS 2.7 6.3  HGB 11.2* 10.1*  HCT 33.9* 30.5*  MCV 92.4 93.0  PLT 120* 84*   Radiological Exams on Admission: Dg Chest 2 View   08/21/2013   New/ increased opacity in the right middle lobe as well as left and possibly right lower lobes. Findings may reflect multifocal pneumonia and/or aspiration.   EKG: Normal sinus rhythm, no ST/T wave changes  Faye Ramsay, MD  [facility] Pager [phone]  If 7PM-7AM, please contact night-coverage [email or web address] Password [facility] 08/21/2013, 5:30 PM

## 2013-08-22 ENCOUNTER — Ambulatory Visit: Payer: Medicare Other

## 2013-08-22 DIAGNOSIS — C8589 Other specified types of non-Hodgkin lymphoma, extranodal and solid organ sites: Secondary | ICD-10-CM

## 2013-08-22 DIAGNOSIS — I2699 Other pulmonary embolism without acute cor pulmonale: Secondary | ICD-10-CM | POA: Diagnosis not present

## 2013-08-22 DIAGNOSIS — J189 Pneumonia, unspecified organism: Secondary | ICD-10-CM | POA: Diagnosis not present

## 2013-08-22 DIAGNOSIS — D638 Anemia in other chronic diseases classified elsewhere: Secondary | ICD-10-CM

## 2013-08-22 LAB — EXPECTORATED SPUTUM ASSESSMENT W GRAM STAIN, RFLX TO RESP C

## 2013-08-22 LAB — BASIC METABOLIC PANEL
BUN: 12 mg/dL (ref 6–23)
CALCIUM: 7.6 mg/dL — AB (ref 8.4–10.5)
CO2: 25 mEq/L (ref 19–32)
CREATININE: 1.05 mg/dL (ref 0.50–1.10)
Chloride: 104 mEq/L (ref 96–112)
GFR calc Af Amer: 60 mL/min — ABNORMAL LOW (ref >90)
GFR, EST NON AFRICAN AMERICAN: 52 mL/min — AB (ref >90)
Glucose, Bld: 83 mg/dL (ref 70–99)
Potassium: 3.5 mEq/L — ABNORMAL LOW (ref 3.7–5.3)
SODIUM: 138 meq/L (ref 137–147)

## 2013-08-22 LAB — CBC
HCT: 25.6 % — ABNORMAL LOW (ref 36.0–46.0)
HEMOGLOBIN: 8.4 g/dL — AB (ref 12.0–15.0)
MCH: 30.8 pg (ref 26.0–34.0)
MCHC: 32.8 g/dL (ref 30.0–36.0)
MCV: 93.8 fL (ref 78.0–100.0)
Platelets: 60 10*3/uL — ABNORMAL LOW (ref 150–400)
RBC: 2.73 MIL/uL — AB (ref 3.87–5.11)
RDW: 15.7 % — ABNORMAL HIGH (ref 11.5–15.5)
WBC: 4.7 10*3/uL (ref 4.0–10.5)

## 2013-08-22 LAB — LEGIONELLA ANTIGEN, URINE: LEGIONELLA ANTIGEN, URINE: NEGATIVE

## 2013-08-22 LAB — PROTIME-INR
INR: 1.8 — ABNORMAL HIGH (ref 0.00–1.49)
PROTHROMBIN TIME: 20.4 s — AB (ref 11.6–15.2)

## 2013-08-22 LAB — EXPECTORATED SPUTUM ASSESSMENT W REFEX TO RESP CULTURE

## 2013-08-22 MED ORDER — GUAIFENESIN 100 MG/5ML PO SOLN
200.0000 mg | ORAL | Status: DC | PRN
  Administered 2013-08-22: 200 mg via ORAL
  Filled 2013-08-22: qty 10

## 2013-08-22 MED ORDER — TBO-FILGRASTIM 300 MCG/0.5ML ~~LOC~~ SOSY
300.0000 ug | PREFILLED_SYRINGE | Freq: Once | SUBCUTANEOUS | Status: DC
  Filled 2013-08-22: qty 0.5

## 2013-08-22 MED ORDER — SODIUM CHLORIDE 0.9 % IJ SOLN: 10.0000 mL | INTRAMUSCULAR | Status: DC | PRN

## 2013-08-22 MED ORDER — WARFARIN SODIUM 3 MG PO TABS
3.0000 mg | ORAL_TABLET | Freq: Once | ORAL | Status: AC
  Administered 2013-08-22: 3 mg via ORAL
  Filled 2013-08-22: qty 1

## 2013-08-22 MED ORDER — FILGRASTIM 300 MCG/ML IJ SOLN
300.0000 ug | Freq: Once | INTRAMUSCULAR | Status: AC
  Administered 2013-08-22: 300 ug via SUBCUTANEOUS
  Filled 2013-08-22: qty 1

## 2013-08-22 MED ORDER — DEXTROSE 5 % IV SOLN
2.0000 g | INTRAVENOUS | Status: DC
  Administered 2013-08-23 – 2013-08-26 (×4): 2 g via INTRAVENOUS
  Filled 2013-08-22 (×4): qty 2

## 2013-08-22 NOTE — Progress Notes (Addendum)
Tonya Wise   DOB:13-Jul-1941   JK#:[id number]   [id number]#:[id number]  Day #1 of Vancomycin, Maxipime  Subjective:  Patient still has cough, mainly non-productive.  No acute overnight events noted.   Patient on 50 cc/ hour.  Husband at bedside.   Objective:  Filed Vitals:   08/22/13 0938  BP: 123/57  Pulse: 85  Temp: 99.2 F (37.3 C)  Resp: 16    Body mass index is 23.41 kg/(m^2).  Intake/Output Summary (Last 24 hours) at 08/22/13 1125 Last data filed at 08/22/13 0416  Gross per 24 hour  Intake  95.83 ml  Output    400 ml  Net -304.17 ml    Laying in bed; AAOx3. + R sided Port-a-cath  Sclerae unicteric  Oropharynx clear, no lip lesions  No peripheral adenopathy  Lungs with decreased breath sounds at the bases, no rales or rhonchi   Heart regular rate and rhythm  Abdomen benign  MSK no focal spinal tenderness, no peripheral edema  Neuro nonfocal   Labs:  Lab Results  Component Value Date   WBC 4.7 08/22/2013   HGB 8.4* 08/22/2013   HCT 25.6* 08/22/2013   MCV 93.8 08/22/2013   PLT 60* 08/22/2013   NEUTROABS 6.3 0/17/5102   Basic Metabolic Panel:  Recent Labs Lab 08/21/13 1535 08/22/13 0405  NA 140 138  K 3.7 3.5*  CL 104 104  CO2 24 25  GLUCOSE 98 83  BUN 10 12  CREATININE 0.94 1.05  CALCIUM 8.4 7.6*   GFR Estimated Creatinine Clearance: 38.3 ml/min (by C-G formula based on Cr of 1.05). Liver Function Tests:  Recent Labs Lab 08/21/13 1535  AST 20  ALT 8  ALKPHOS 59  BILITOT 0.6  PROT 5.3*  ALBUMIN 3.0*   Coagulation profile  Recent Labs Lab 08/17/13 1106 08/21/13 1535 08/22/13 0405  INR 2.40 1.83* 1.80*  PROTIME 28.8*  --   --     CBC:  Recent Labs Lab 08/17/13 1106 08/21/13 1535 08/22/13 0405  WBC 4.5 6.9 4.7  NEUTROABS 2.7 6.3  --   HGB 11.2* 10.1* 8.4*  HCT 33.9* 30.5* 25.6*  MCV 92.4 93.0 93.8  PLT 120* 84* 60*   Thyroid function studies  Recent Labs  08/21/13 1905  TSH 1.925   Results for Tonya, Wise (MRN [id number])  as of 08/22/2013 11:28  Ref. Range 08/21/2013 19:05  Pro B Natriuretic peptide (BNP) Latest Range: 0-125 pg/mL 21795.0 (H)   Results for Tonya, Wise (MRN [id number]) as of 08/22/2013 11:28  Ref. Range 08/21/2013 19:05  TSH Latest Range: 0.350-4.500 uIU/mL 1.925  HIV Latest Range: NON REACTIVE  NON REACTIVE   Studies:  Dg Chest 2 View  08/21/2013   CLINICAL DATA:  Pain.  Shortness of breath.  EXAM: CHEST  2 VIEW  COMPARISON:  PET-CT 06/16/2013.  Chest radiograph 04/25/2013.  FINDINGS: Right-sided Port-A-Cath remains in place with tip overlying the lower SVC, unchanged. Cardiomediastinal silhouette is within normal limits. There are new/ increased patchy opacities within the right middle lobe. There is also a new opacity in the left and possibly right lower lobes. No pleural effusion is identified. There is no pneumothorax. Posterior lumbar fixation hardware is partially visualized.  IMPRESSION: New/ increased opacity in the right middle lobe as well as left and possibly right lower lobes. Findings may reflect multifocal pneumonia and/or aspiration.   Electronically Signed   By: Tonya Wise   On: 08/21/2013 15:19    Assessment: 73 y.o. with the following:  #  1. HCAP.  --Agree with broad spectrum antibiotics per PCP.   --Follow-up cultures. UA negative.   Supportive care with gentle hydration.   --Oxygen as needed.   #2. DLBCL, stage IV with bone marrow involvement --We will hold any further chemotherapy due to #1.  Allow recovery.   #3. S/p mini R-CEO on 01/21; Cycle #4 of planned 6. --She received 100mg  PO of ectoposide on Day#2 (1/22); 50 mg PO of etoposide on Day#3 (1/23 in am);  Vincristine 1 mg, Cyclophosphamide 400 mg/m(2), Rituximab 375 mg/m(2)  On day #1.  Prednisone was held due to psychosis, confusion and at patient's request.  --Please give Neupogen 300 mcg sq  daily starting this evening at 5pm.   #4. 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 --Doxorubicin was discontinued due to  worsening congestive heart failure.  BNP noted.  Avoid overhydration.  Daily weights, Intake and outake.  She is evolemic .   #5. Chronic cough. --Continue supportive care.    #6. Anemia/Thrombocytopenia secondary to #3.  --Transfuse pRBC prn symptoms; transfuse plts if less than 10,000 or presences of bleeding.   --Hold a/c when plts less than 50,000 as the risk of bleeding is high. CBC daily.   #7. PE (05/2013). --On coumadin.  Goal INR  2-3.  Planned treatment of six months.  --As noted above, hold a/c when plts less than 50K.  INR daily.   #8. Hypokalemia, mild.  --Add KCL 10 meQ daily.    #9. 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 --Continue valcyte prophylaxis and diflucan prn  #10. Poor nutrition. --Consult nutrition prn.   #11. Rule Respiratory virus --follow up respiratory swabs.  In respiratory isolation.   Disposition.  Full.    Tonya Kemppainen, MD 08/22/2013  11:25 AM

## 2013-08-22 NOTE — Progress Notes (Signed)
On call provider notified of [facility] lab reporting ana. Bottle of blood cultures drawn on 08/21/13 growing gram + cocci in clusters.

## 2013-08-22 NOTE — Progress Notes (Signed)
ANTICOAGULATION CONSULT NOTE - Follow Up Consult  Pharmacy Consult for Coumadin Indication: recent pulmonary embolism  Allergies  Allergen Reactions  . 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 Pt was started on Lovenox bridge to Coumadin. Currently being followed by anticoag clinic at [facility]. Of note, on 1/15, pt's INR was 6.85 on Coumadin 2mg  x 2 days then 4mg  daily. Coumadin held 1/16 - 1/18. Repeat INR on 1/19 wnl at 2.4. Then patient instructed to start Coumadin 2mg  daily, last dose on 1/23. On admission, pt's INR is 1.83. MD ordered to resume Coumadin inpatient.   1/24: INR 1.8 today, platelets dropped down to 60K. Per MD notes, if plts dropped < 50K, reconsider continuation of Coumadin. Hgb down to 8.4  Goal of Therapy:  INR 2-3 Monitor platelets by anticoagulation protocol: Yes   Plan:   Coumadin 3mg  po x 1 tonight  Monitor platelets closely and stop Coumadin if needed   Daily PT/INR  Pharmacy will f/u  Also pharmacy will adjust Cefepime to 2g IV q24h given CG CrCl of 38 ml/min.   Vanessa [place], PharmD, BCPS Pager: [phone] 11:08 AM Pharmacy #: [id number]

## 2013-08-22 NOTE — Progress Notes (Addendum)
[facility] PROGRESS NOTE  NATHALY DAWKINS DTO:[id number] DOB: 10-Nov-1940 DOA: 08/21/2013 PCP: [name], MD  Brief narrative: Pt is 73 yo female with past medical history of NHL, under the care of Dr. Juliann Mule, combined diastolic and systolic CHF, grade II, EF 40%, who presented to [facility] ED 08/21/2013 with main concern of several days duration of progressively worsening shortness of breath associated with non productive cough, subjective fevers, chills, malaise, poor oral intake.   In ED, pt noted to have oxygen saturation 90% on RA, CXR findings worrisome for PNA. Pt started on broad spectrum antibiotics in ED.  Assessment and Plan:   Principal Problem: Acute hypoxic respiratory failure  - likely secondary to HCAP  - rule out influenza; follow up results of respiratory viral panel - strep pneumo negative, legionella pending - blood cultures to date are negative - continue vancomycin and cefepime for now - oxygen support via nasal canula to keep O2 saturation above 90% - only gentle hydration Active Problems: HCAP  - cover with broad spectrum ABX Vancomycin and Cefepime - BD as needed  - provide oxygen for now as oxygen saturation in RA 90%  DLBCL with bone marrow involvement - per oncology management  Anemia of chronic disease  - secondary to history of malignancy and sequela of chemotherapy - hemoglobin 8.4 this am Thrombocytopenia  - secondary to history of malignancy and sequela of chemotherapy - transfuse if platelet count less than 10 or if bleeding  PE  - continue Coumadin per pharmacy but close attention to platelets; stop if platelet count less than 50 HTN  - continue bisoprolol, losartan  Combined systolic and diastolic CHF  - careful hydration with NS @ 50 cc/hr  - daily weights and I's and O's  Moderate protein calorie malnutrition - nutrition consulted  Code Status: Full  Family Communication: Pt at bedside  Disposition Plan: remains inpatient     Leisa Lenz, MD  [facility] Pager [phone]  If 7PM-7AM, please contact night-coverage [email or web address] Password [facility] 08/22/2013, 6:32 PM   LOS: 1 day   Consultants:  Oncology (Dr. Juliann Mule)  Procedures:  None   Antibiotics:  Vancomycin 08/21/2013 -->  Cefepime 08/21/2013 -->  HPI/Subjective: No acute overnight events.   Objective: Filed Vitals:   08/21/13 1946 08/22/13 0413 08/22/13 0938 08/22/13 1500  BP: 136/65 127/63 123/57 131/66  Pulse: 102 88 85 84  Temp: 101 F (38.3 C) 100 F (37.8 C) 99.2 F (37.3 C) 99.1 F (37.3 C)  TempSrc: Oral Oral Oral Oral  Resp: 20 16 16 18   Height: 5\' 2"  (1.575 m)     Weight: 58.06 kg (128 lb)     SpO2: 97% 98% 97% 96%    Intake/Output Summary (Last 24 hours) at 08/22/13 1832 Last data filed at 08/22/13 1500  Gross per 24 hour  Intake 335.83 ml  Output    800 ml  Net -464.17 ml    Exam:   General:  Pt is alert, follows commands appropriately, not in acute distress  Cardiovascular: Regular rate and rhythm, S1/S2 appreciated  Respiratory: Congested, coughing with inspiration, no wheezing   Abdomen: Soft, non tender, non distended, bowel sounds present, no guarding  Extremities: No edema, pulses DP and PT palpable bilaterally  Neuro: Grossly nonfocal  Data Reviewed: Basic Metabolic Panel:  Recent Labs Lab 08/21/13 1535 08/22/13 0405  NA 140 138  K 3.7 3.5*  CL 104 104  CO2 24 25  GLUCOSE 98 83  BUN 10 12  CREATININE  0.94 1.05  CALCIUM 8.4 7.6*   Liver Function Tests:  Recent Labs Lab 08/21/13 1535  AST 20  ALT 8  ALKPHOS 59  BILITOT 0.6  PROT 5.3*  ALBUMIN 3.0*   No results found for this basename: LIPASE, AMYLASE,  in the last 168 hours No results found for this basename: AMMONIA,  in the last 168 hours CBC:  Recent Labs Lab 08/17/13 1106 08/21/13 1535 08/22/13 0405  WBC 4.5 6.9 4.7  NEUTROABS 2.7 6.3  --   HGB 11.2* 10.1* 8.4*  HCT 33.9* 30.5* 25.6*  MCV 92.4 93.0 93.8   PLT 120* 84* 60*   Cardiac Enzymes: No results found for this basename: CKTOTAL, CKMB, CKMBINDEX, TROPONINI,  in the last 168 hours BNP: No components found with this basename: POCBNP,  CBG: No results found for this basename: GLUCAP,  in the last 168 hours  CULTURE, BLOOD (ROUTINE X 2)     Status: None   Collection Time    08/21/13  2:46 PM      Result Value Range Status   Specimen Description BLOOD LEFT ANTECUBITAL   Final   Value:        BLOOD CULTURE RECEIVED NO GROWTH TO DATE CULTURE WILL BE HELD FOR 5 DAYS BEFORE ISSUING A FINAL NEGATIVE REPORT     Performed at [facility]   Report Status PENDING   Incomplete  URINE CULTURE     Status: None   Collection Time    08/21/13  3:16 PM      Result Value Range Status   Specimen Description URINE, CLEAN CATCH   Final   Value: GRAM NEGATIVE RODS   Report Status PENDING   Incomplete  CULTURE, BLOOD (ROUTINE X 2)     Status: None   Collection Time    08/21/13  3:35 PM      Result Value Range Status   Specimen Description BLOOD RIGHT ANTECUBITAL   Final   Value:        BLOOD CULTURE RECEIVED NO GROWTH TO DATE     Performed at [facility]   Report Status PENDING   Incomplete     Studies: Dg Chest 2 View 08/21/2013   IMPRESSION: New/ increased opacity in the right middle lobe as well as left and possibly right lower lobes. Findings may reflect multifocal pneumonia and/or aspiration.   Scheduled Meds: . bisoprolol  10 mg Oral Daily  .  ceFEPime (MAXIPIME)   2 g Intravenous Q24H  . guaiFENesin  600 mg Oral BID  . losartan  25 mg Oral BID  . pantoprazole  40 mg Oral Daily  . potassium chloride  10 mEq Oral Daily  . valACYclovir  500 mg Oral Daily  . vancomycin  500 mg Intravenous Q12H  . Warfarin    Does not apply q1800

## 2013-08-23 DIAGNOSIS — T451X5A Adverse effect of antineoplastic and immunosuppressive drugs, initial encounter: Secondary | ICD-10-CM

## 2013-08-23 DIAGNOSIS — I5033 Acute on chronic diastolic (congestive) heart failure: Secondary | ICD-10-CM | POA: Diagnosis not present

## 2013-08-23 DIAGNOSIS — B370 Candidal stomatitis: Secondary | ICD-10-CM

## 2013-08-23 DIAGNOSIS — D6481 Anemia due to antineoplastic chemotherapy: Secondary | ICD-10-CM | POA: Diagnosis not present

## 2013-08-23 DIAGNOSIS — I509 Heart failure, unspecified: Secondary | ICD-10-CM

## 2013-08-23 DIAGNOSIS — D696 Thrombocytopenia, unspecified: Secondary | ICD-10-CM | POA: Diagnosis not present

## 2013-08-23 DIAGNOSIS — J1100 Influenza due to unidentified influenza virus with unspecified type of pneumonia: Secondary | ICD-10-CM | POA: Diagnosis not present

## 2013-08-23 DIAGNOSIS — E876 Hypokalemia: Secondary | ICD-10-CM

## 2013-08-23 DIAGNOSIS — R05 Cough: Secondary | ICD-10-CM | POA: Diagnosis not present

## 2013-08-23 DIAGNOSIS — N390 Urinary tract infection, site not specified: Secondary | ICD-10-CM

## 2013-08-23 DIAGNOSIS — R059 Cough, unspecified: Secondary | ICD-10-CM

## 2013-08-23 DIAGNOSIS — J189 Pneumonia, unspecified organism: Secondary | ICD-10-CM | POA: Diagnosis not present

## 2013-08-23 DIAGNOSIS — C8589 Other specified types of non-Hodgkin lymphoma, extranodal and solid organ sites: Secondary | ICD-10-CM | POA: Diagnosis not present

## 2013-08-23 DIAGNOSIS — J988 Other specified respiratory disorders: Secondary | ICD-10-CM

## 2013-08-23 DIAGNOSIS — I5040 Unspecified combined systolic (congestive) and diastolic (congestive) heart failure: Secondary | ICD-10-CM

## 2013-08-23 LAB — URINE CULTURE: Colony Count: 10000

## 2013-08-23 LAB — VANCOMYCIN, TROUGH: Vancomycin Tr: 13.1 ug/mL (ref 10.0–20.0)

## 2013-08-23 LAB — CBC WITH DIFFERENTIAL/PLATELET
BASOS PCT: 0 % (ref 0–1)
Basophils Absolute: 0 10*3/uL (ref 0.0–0.1)
Eosinophils Absolute: 0 10*3/uL (ref 0.0–0.7)
Eosinophils Relative: 0 % (ref 0–5)
HCT: 23.9 % — ABNORMAL LOW (ref 36.0–46.0)
HEMOGLOBIN: 8 g/dL — AB (ref 12.0–15.0)
Lymphocytes Relative: 3 % — ABNORMAL LOW (ref 12–46)
Lymphs Abs: 0.5 10*3/uL — ABNORMAL LOW (ref 0.7–4.0)
MCH: 31.5 pg (ref 26.0–34.0)
MCHC: 33.5 g/dL (ref 30.0–36.0)
MCV: 94.1 fL (ref 78.0–100.0)
MONOS PCT: 1 % — AB (ref 3–12)
Monocytes Absolute: 0.1 10*3/uL (ref 0.1–1.0)
NEUTROS ABS: 14 10*3/uL — AB (ref 1.7–7.7)
Neutrophils Relative %: 96 % — ABNORMAL HIGH (ref 43–77)
Platelets: 53 10*3/uL — ABNORMAL LOW (ref 150–400)
RBC: 2.54 MIL/uL — AB (ref 3.87–5.11)
RDW: 15.3 % (ref 11.5–15.5)
WBC: 14.6 10*3/uL — ABNORMAL HIGH (ref 4.0–10.5)

## 2013-08-23 LAB — BASIC METABOLIC PANEL
BUN: 11 mg/dL (ref 6–23)
CALCIUM: 7.9 mg/dL — AB (ref 8.4–10.5)
CO2: 24 mEq/L (ref 19–32)
Chloride: 104 mEq/L (ref 96–112)
Creatinine, Ser: 0.84 mg/dL (ref 0.50–1.10)
GFR calc Af Amer: 79 mL/min — ABNORMAL LOW (ref >90)
GFR, EST NON AFRICAN AMERICAN: 68 mL/min — AB (ref >90)
GLUCOSE: 97 mg/dL (ref 70–99)
POTASSIUM: 3.6 meq/L — AB (ref 3.7–5.3)
SODIUM: 140 meq/L (ref 137–147)

## 2013-08-23 LAB — INFLUENZA PANEL BY PCR (TYPE A & B)
H1N1 flu by pcr: NOT DETECTED
INFLAPCR: POSITIVE — AB
Influenza B By PCR: NEGATIVE

## 2013-08-23 LAB — PROTIME-INR
INR: 1.51 — AB (ref 0.00–1.49)
Prothrombin Time: 17.8 seconds — ABNORMAL HIGH (ref 11.6–15.2)

## 2013-08-23 MED ORDER — WARFARIN SODIUM 3 MG PO TABS
3.0000 mg | ORAL_TABLET | Freq: Once | ORAL | Status: AC
  Administered 2013-08-23: 3 mg via ORAL
  Filled 2013-08-23: qty 1

## 2013-08-23 MED ORDER — BOOST / RESOURCE BREEZE PO LIQD
1.0000 | Freq: Two times a day (BID) | ORAL | Status: DC
  Administered 2013-08-23 – 2013-08-26 (×4): 1 via ORAL

## 2013-08-23 MED ORDER — OSELTAMIVIR PHOSPHATE 75 MG PO CAPS
75.0000 mg | ORAL_CAPSULE | Freq: Every day | ORAL | Status: DC
  Administered 2013-08-23 – 2013-08-26 (×4): 75 mg via ORAL
  Filled 2013-08-23 (×4): qty 1

## 2013-08-23 NOTE — Progress Notes (Signed)
Tonya Wise   DOB:1940/09/24   HC#:[id number]   DVV#:[id number]  Day #2 of Vancomycin, Maxipime  Subjective:  Patient still has cough, mainly non-productive.  No acute overnight events noted.     Husband at bedside.  Blood cultures positive yesterday late evening for gram + cocci in clusters.  Objective:  Filed Vitals:   08/23/13 0500  BP: 131/57  Pulse: 91  Temp: 98.7 F (37.1 C)  Resp: 16    Body mass index is 23.41 kg/(m^2).  Intake/Output Summary (Last 24 hours) at 08/23/13 0820 Last data filed at 08/23/13 0335  Gross per 24 hour  Intake    600 ml  Output   1325 ml  Net   -725 ml    Laying in bed; AAOx3. + R sided Port-a-cath  Sclerae unicteric  Oropharynx clear, no lip lesions  No peripheral adenopathy  Lungs slight decreased bs at bases, no rales or rhonchi ; slight increased WOB  Heart regular rate and rhythm  Abdomen benign  MSK no peripheral edema  Neuro nonfocal   Labs:  Lab Results  Component Value Date   WBC 14.6* 08/23/2013   HGB 8.0* 08/23/2013   HCT 23.9* 08/23/2013   MCV 94.1 08/23/2013   PLT 53* 08/23/2013   NEUTROABS 14.0* 01/23/9484   Basic Metabolic Panel:  Recent Labs Lab 08/21/13 1535 08/22/13 0405  NA 140 138  K 3.7 3.5*  CL 104 104  CO2 24 25  GLUCOSE 98 83  BUN 10 12  CREATININE 0.94 1.05  CALCIUM 8.4 7.6*   GFR Estimated Creatinine Clearance: 38.3 ml/min (by C-G formula based on Cr of 1.05). Liver Function Tests:  Recent Labs Lab 08/21/13 1535  AST 20  ALT 8  ALKPHOS 59  BILITOT 0.6  PROT 5.3*  ALBUMIN 3.0*   Coagulation profile  Recent Labs Lab 08/17/13 1106 08/21/13 1535 08/22/13 0405 08/23/13 0645  INR 2.40 1.83* 1.80* 1.51*  PROTIME 28.8*  --   --   --     CBC:  Recent Labs Lab 08/17/13 1106 08/21/13 1535 08/22/13 0405 08/23/13 0645  WBC 4.5 6.9 4.7 14.6*  NEUTROABS 2.7 6.3  --  14.0*  HGB 11.2* 10.1* 8.4* 8.0*  HCT 33.9* 30.5* 25.6* 23.9*  MCV 92.4 93.0 93.8 94.1  PLT 120* 84* 60* 53*   Thyroid  function studies  Recent Labs  08/21/13 1905  TSH 1.925   Results for Tonya, Wise (MRN [id number]) as of 08/22/2013 11:28  Ref. Range 08/21/2013 19:05  Pro B Natriuretic peptide (BNP) Latest Range: 0-125 pg/mL 21795.0 (H)   Results for Tonya Wise, Tonya Wise (MRN [id number]) as of 08/22/2013 11:28  Ref. Range 08/21/2013 19:05  TSH Latest Range: 0.350-4.500 uIU/mL 1.925  HIV Latest Range: NON REACTIVE  NON REACTIVE   Studies:  Dg Chest 2 View  08/21/2013   CLINICAL DATA:  Pain.  Shortness of breath.  EXAM: CHEST  2 VIEW  COMPARISON:  PET-CT 06/16/2013.  Chest radiograph 04/25/2013.  FINDINGS: Right-sided Port-A-Cath remains in place with tip overlying the lower SVC, unchanged. Cardiomediastinal silhouette is within normal limits. There are new/ increased patchy opacities within the right middle lobe. There is also a new opacity in the left and possibly right lower lobes. No pleural effusion is identified. There is no pneumothorax. Posterior lumbar fixation hardware is partially visualized.  IMPRESSION: New/ increased opacity in the right middle lobe as well as left and possibly right lower lobes. Findings may reflect multifocal pneumonia and/or aspiration.  Electronically Signed   By: Logan Bores   On: 08/21/2013 15:19    Assessment: 73 y.o. with the following:  #1. HCAP, bacteremia.  --Agree with broad spectrum antibiotics per PCP.   --Follow-up cultures.  Blood cultures with gram + cocci in clusters. UA negative.   Supportive care with gentle hydration.   --Oxygen as needed.   #2. DLBCL, stage IV with bone marrow involvement --We will hold any further chemotherapy due to #1.  Allow couont recovery.  --Given one neupogen yesterday.  Resume prn.   #3. S/p mini R-CEO on 01/21; Cycle #4 of planned 6. --She received 100mg  PO of ectoposide on Day#2 (1/22); 50 mg PO of etoposide on Day#3 (1/23 in am);  Vincristine 1 mg,Cyclophosphamide 400 mg/m(2), Rituximab 375 mg/m(2)  On day #1.  Prednisone  was held due to psychosis, confusion and at patient's request.   #4. Diastolic/systolic CHF (EF 68-37%). --Doxorubicin was discontinued due to worsening congestive heart failure.  BNP noted.  Avoid overhydration.  Daily weights, Intake and outake.  She is evolemic .   #5. Chronic cough. --Continue supportive care.  Patient reports chest discomfort secondary to chronic coughing.  Add mild pain relief.    #6. Anemia/Thrombocytopenia secondary to #3.  --Transfuse pRBC prn symptoms; transfuse plts if less than 10,000 or presences of bleeding.   --Hold a/c when plts less than 50,000 as the risk of bleeding is high. CBC daily.   #7. PE (05/2013). --On coumadin.  Goal INR  2-3.  Planned treatment of six months.  --As noted above, hold a/c when plts less than 50K.  INR daily.   #8. Hypokalemia, mild.  --Add KCL 10 meQ daily.    #9. 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 --Continue valcyte prophylaxis and diflucan prn  #10. Poor nutrition. --Consult nutrition prn.   #11. Rule Respiratory virus --follow up respiratory swabs.  In respiratory isolation.   Disposition.  Full.    Aza Dantes, MD 08/23/2013  8:20 AM

## 2013-08-23 NOTE — Progress Notes (Signed)
ANTIBIOTIC CONSULT NOTE - FOLLOW UP  Pharmacy Consult for Vancomycin and Cefepime Indication: HCAP, UTI, GPC bacteremia  Allergies  Allergen Reactions  . Penicillins Rash    Patient Measurements: Height: 5\' 2"  (157.5 cm) Weight: 128 lb (58.06 kg) IBW/kg (Calculated) : 50.1  Vital Signs: Temp: 100.4 F (38 C) (01/25 1500) Temp src: Oral (01/25 1500) BP: 133/65 mmHg (01/25 1500) Pulse Rate: 90 (01/25 1500) Intake/Output from previous day: 01/24 0701 - 01/25 0700 In: 600 [P.O.:600] Out: 1325 [Urine:1325] Intake/Output from this shift: Total I/O In: 240 [P.O.:240] Out: 400 [Urine:400]  Labs:  Recent Labs  08/21/13 1535 08/22/13 0405 08/23/13 0645 08/23/13 1633  WBC 6.9 4.7 14.6*  --   HGB 10.1* 8.4* 8.0*  --   PLT 84* 60* 53*  --   CREATININE 0.94 1.05  --  0.84   Estimated Creatinine Clearance: 47.9 ml/min (by C-G formula based on Cr of 0.84).  Recent Labs  08/23/13 1633  VANCOTROUGH 13.1     Microbiology: 1/23 blood x 2 >> 1/2 GPC in clusters  1/23 legionella/strep pneumo >> neg 1/23 sputum >> pending 1/23 respiratory virus ag >> ordered 1/23 influenza panel >> + Influenza A  1/23 urine >> klebsiella ozaenae (R- ampicillin, I - nitrofurantoin, S - cephalosporins, LVQ, Gent, Zosyn, Tobra, Bactrim)  Anti-infectives: 1/23 >> Vanco >> 1/23 >> Cefepime >> 1/24 >> Valacyclovir (from PTA for viral ppx) >>  1/25 >> Tamiflu x5d >>  Assessment: 73 yo female with h/o non Hodgkin's lymphoma s/p chemotherapy admitted on 1/23 with cough and shortness of breath.  Day #3 vancomycin and cefepime for HCAP, Klebsiella UTI and bacteremia  Vancomycin trough slightly < goal on 500mg  q12h  SCr decreased 0.84, CrCl 48 ml/min  WBC up 14.6 after neupogen x 1 dose on 1/24  Tmax 100.4   Goal of Therapy:  Vancomycin trough level 15-20 mcg/ml Cefepime dose per renal functon  Plan:   Continue Vancomycin 500mg  IV q12h as accumulation is expected - recheck trough in  3-4 days if continues, sooner if renal function changes  Continue Cefepime 2g IV q24h Follow up renal function & cultures  Peggyann Juba, PharmD, BCPS Pager: [phone] 08/23/2013,5:55 PM

## 2013-08-23 NOTE — Progress Notes (Addendum)
[facility] PROGRESS NOTE  Tonya Wise [id number] DOB: 1941/02/02 DOA: 08/21/2013 PCP: [name], MD  Brief narrative: Pt is 73 yo female with past medical history of NHL, under the care of Dr. Juliann Mule, combined diastolic and systolic CHF, grade II, EF 40%, who presented to [facility] ED 08/21/2013 with main concern of several days duration of progressively worsening shortness of breath associated with non productive cough, subjective fevers, chills, malaise, poor oral intake.  In ED, pt noted to have oxygen saturation 90% on RA, CXR findings worrisome for PNA. Pt started on broad spectrum antibiotics in ED.  Hospital course complicated due to findings of possible bacteremia in addition to positive influenza PCR and UTI with prelim culture growing gram negative rods.  Assessment and Plan:   Principal Problem:  Acute hypoxic respiratory failure  - multifactorial and likely secondary to combination of HCAP, influenza pneumonia, bacteremia and UTI - influenza positive; started tamiflu 08/23/2013 - strep pneumo negative, legionella negative - blood cultures, 1 culture growing gram pos cocci in clusters and other culture no growth to date; obtain blood cultures today - continue vancomycin and cefepime for now which would cover for HCAP, bacteremia and UTI; narrow down as final result become available - oxygen support via nasal canula to keep O2 saturation above 90%  - only gentle hydration  Active Problems:  HCAP and Influenza pneumonia - cover with broad spectrum ABX Vancomycin and Cefepime  - added tamiflu for influenza positive PCR - BD as needed  UTI - gram negative rods on urine culture - follow up final urine culture result - on vanco and cefepime  DLBCL with bone marrow involvement  - per oncology management  Anemia of chronic disease  - secondary to history of malignancy and sequela of chemotherapy  - hemoglobin 8.4 this am  Thrombocytopenia  - secondary to history  of malignancy and sequela of chemotherapy  - transfuse if platelet count less than 10 or if bleeding  PE  - continue Coumadin per pharmacy but close attention to platelets; stop if platelet count less than 50  HTN  - continue bisoprolol, losartan  Combined systolic and diastolic CHF  - careful hydration with NS @ 50 cc/hr  - daily weights and I's and O's  Moderate protein calorie malnutrition  - nutrition consulted   Code Status: Full  Family Communication: Patient's husband at the bedside  Disposition Plan:  Home when stable  Consultants:  Oncology (Dr. Juliann Mule) Procedures:  None  Antibiotics:  Vancomycin 08/21/2013 -->  Cefepime 08/21/2013 --> Tamiflu 08/23/2013 -->  Leisa Lenz, MD  [facility] Pager [phone]  If 7PM-7AM, please contact night-coverage [email or web address] Password [facility] 08/23/2013, 7:18 AM   LOS: 2 days    HPI/Subjective: No overnight events. Still has cough but reports feeling somewhat better.   Objective: Filed Vitals:   08/22/13 0938 08/22/13 1500 08/22/13 2059 08/23/13 0500  BP: 123/57 131/66 133/54 131/57  Pulse: 85 84 85 91  Temp: 99.2 F (37.3 C) 99.1 F (37.3 C) 99.9 F (37.7 C) 98.7 F (37.1 C)  TempSrc: Oral Oral Oral Oral  Resp: 16 18 16 16   Height:      Weight:      SpO2: 97% 96% 99% 96%    Intake/Output Summary (Last 24 hours) at 08/23/13 0718 Last data filed at 08/23/13 0335  Gross per 24 hour  Intake    600 ml  Output   1325 ml  Net   -725 ml    Exam:  General:  Pt is alert, follows commands appropriately, not in acute distress  Cardiovascular: Regular rate and rhythm, S1/S2 appreciated  Respiratory: Clear to auscultation bilaterally, no wheezing  Abdomen: Soft, non tender, non distended, bowel sounds present, no guarding  Extremities: No edema, pulses DP and PT palpable bilaterally  Neuro: Grossly nonfocal  Data Reviewed: Basic Metabolic Panel:  Recent Labs Lab 08/21/13 1535 08/22/13 0405  NA 140 138   K 3.7 3.5*  CL 104 104  CO2 24 25  GLUCOSE 98 83  BUN 10 12  CREATININE 0.94 1.05  CALCIUM 8.4 7.6*   Liver Function Tests:  Recent Labs Lab 08/21/13 1535  AST 20  ALT 8  ALKPHOS 59  BILITOT 0.6  PROT 5.3*  ALBUMIN 3.0*   No results found for this basename: LIPASE, AMYLASE,  in the last 168 hours No results found for this basename: AMMONIA,  in the last 168 hours CBC:  Recent Labs Lab 08/17/13 1106 08/21/13 1535 08/22/13 0405 08/23/13 0645  WBC 4.5 6.9 4.7 14.6*  NEUTROABS 2.7 6.3  --  14.0*  HGB 11.2* 10.1* 8.4* 8.0*  HCT 33.9* 30.5* 25.6* 23.9*  MCV 92.4 93.0 93.8 94.1  PLT 120* 84* 60* 53*   Cardiac Enzymes: No results found for this basename: CKTOTAL, CKMB, CKMBINDEX, TROPONINI,  in the last 168 hours BNP: No components found with this basename: POCBNP,  CBG: No results found for this basename: GLUCAP,  in the last 168 hours  CULTURE, BLOOD (ROUTINE X 2)     Status: None   Collection Time    08/21/13  2:46 PM      Result Value Range Status   Specimen Description BLOOD LEFT ANTECUBITAL   Final   Value: GRAM POSITIVE COCCI IN CLUSTERS   Report Status PENDING   Incomplete  URINE CULTURE     Status: None   Collection Time    08/21/13  3:16 PM      Result Value Range Status   Specimen Description URINE, CLEAN CATCH   Final   Value: GRAM NEGATIVE RODS   Report Status PENDING   Incomplete  CULTURE, BLOOD (ROUTINE X 2)     Status: None   Collection Time    08/21/13  3:35 PM      Result Value Range Status   Specimen Description BLOOD RIGHT ANTECUBITAL   Final   Value:        BLOOD CULTURE RECEIVED NO GROWTH TO DATE    Report Status PENDING   Incomplete     Studies: Dg Chest 2 View  08/21/2013   CLINICAL DATA:  Pain.  Shortness of breath.  EXAM: CHEST  2 VIEW  COMPARISON:  PET-CT 06/16/2013.  Chest radiograph 04/25/2013.  FINDINGS: Right-sided Port-A-Cath remains in place with tip overlying the lower SVC, unchanged. Cardiomediastinal silhouette is  within normal limits. There are new/ increased patchy opacities within the right middle lobe. There is also a new opacity in the left and possibly right lower lobes. No pleural effusion is identified. There is no pneumothorax. Posterior lumbar fixation hardware is partially visualized.  IMPRESSION: New/ increased opacity in the right middle lobe as well as left and possibly right lower lobes. Findings may reflect multifocal pneumonia and/or aspiration.   Electronically Signed   By: Logan Bores   On: 08/21/2013 15:19    Scheduled Meds: . bisoprolol  10 mg Oral Daily  . ceFEPime (MAXIPIME) IV  2 g Intravenous Q24H  . guaiFENesin  600 mg Oral  BID  . losartan  25 mg Oral BID  . pantoprazole  40 mg Oral Daily  . potassium chloride  10 mEq Oral Daily  . valACYclovir  500 mg Oral Daily  . vancomycin  500 mg Intravenous Q12H  . Warfarin - Pharmacist Dosing Inpatient   Does not apply q1800   Continuous Infusions:

## 2013-08-23 NOTE — Progress Notes (Signed)
ANTICOAGULATION CONSULT NOTE - Follow Up Consult  Pharmacy Consult for Coumadin Indication: recent pulmonary embolism  Allergies  Allergen Reactions  . 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 Pt was started on Lovenox bridge to Coumadin. Currently being followed by anticoag clinic at [facility]. Of note, on 1/15, pt's INR was 6.85 on Coumadin 2mg  x 2 days then 4mg  daily. Coumadin held 1/16 - 1/18. Repeat INR on 1/19 wnl at 2.4. Then patient instructed to start Coumadin 2mg  daily, last dose on 1/23. On admission, pt's INR is 1.83. MD ordered to resume Coumadin inpatient.   INR dropped to 1.51 today. Platelets dropped down to 53K. Per MD notes, to hold Coumadin if platelets dropped < 50K (likely tomorrow given trend). Hgb 8.   Goal of Therapy:  INR 2-3 Monitor platelets by anticoagulation protocol: Yes   Plan:   Coumadin 3mg  po x 1 tonight  Monitor platelets closely and stop Coumadin if platelets < 50K   Daily PT/INR  Pharmacy will f/u  Vanessa Riverlea, PharmD, BCPS Pager: [phone] 2:39 PM Pharmacy #: [id number]

## 2013-08-23 NOTE — Progress Notes (Signed)
INITIAL NUTRITION ASSESSMENT  DOCUMENTATION CODES Per approved criteria  -Severe malnutrition in the context of chronic illness   INTERVENTION:  Bid snacks  Resource Breeze bid  Encouraged po intake and discussed importance of small frequent meals.  NUTRITION DIAGNOSIS: Inadequate oral intae related to poor appetite as evidenced by observation and weight loss..   Goal: Maximize intake of meals and supplements to prevent weight loss.  Monitor:  Intake, labs, weight trend  Reason for Assessment: mst  73 y.o. female  Admitting Dx: <principal problem not specified>  ASSESSMENT: Patient admitted with HCAP bacteremia.  Hx of DLBCL, stage IV with bone marrow involvement currently undergoing chemo.  Patient reports no appetite.  Daughter in room.  Intake is minimal.  Dislikes Ensure and Boost which cause vomiting.  Dislikes sweets.  Patient meets criteria for severe malnutrition in the context of chronic illness AEB weight loss of 26% in the past nine months and intake <75% for > one month.  Height: Ht Readings from Last 1 Encounters:  08/21/13 5\' 2"  (1.575 m)    Weight: Wt Readings from Last 1 Encounters:  08/21/13 128 lb (58.06 kg)    Ideal Body Weight: 110 lbs  % Ideal Body Weight: 116  Wt Readings from Last 10 Encounters:  08/21/13 128 lb (58.06 kg)  08/12/13 120 lb 6.4 oz (54.613 kg)  07/29/13 123 lb (55.792 kg)  07/13/13 132 lb 3.2 oz (59.966 kg)  07/07/13 132 lb (59.875 kg)  06/22/13 137 lb 8 oz (62.37 kg)  06/19/13 132 lb (59.875 kg)  06/17/13 138 lb (62.596 kg)  05/28/13 154 lb (69.854 kg)  04/29/13 185 lb 10 oz (84.2 kg)  11/25/12:  175 lbs  Usual Body Weight: 175 lbs 9 months ago  % Usual Body Weight: 73  BMI:  Body mass index is 23.41 kg/(m^2).  Estimated Nutritional Needs: Kcal: 1800-2000 Protein: 85-95 gm Fluid: >2L daily   Diet Order: General  EDUCATION NEEDS: -Education needs addressed   Intake/Output Summary (Last 24 hours) at  08/23/13 1530 Last data filed at 08/23/13 0335  Gross per 24 hour  Intake    360 ml  Output    925 ml  Net   -565 ml    Labs:   Recent Labs Lab 08/21/13 1535 08/22/13 0405  NA 140 138  K 3.7 3.5*  CL 104 104  CO2 24 25  BUN 10 12  CREATININE 0.94 1.05  CALCIUM 8.4 7.6*  GLUCOSE 98 83    CBG (last 3)  No results found for this basename: GLUCAP,  in the last 72 hours  Scheduled Meds: . bisoprolol  10 mg Oral Daily  . ceFEPime (MAXIPIME) IV  2 g Intravenous Q24H  . guaiFENesin  600 mg Oral BID  . losartan  25 mg Oral BID  . oseltamivir  75 mg Oral Daily  . pantoprazole  40 mg Oral Daily  . potassium chloride  10 mEq Oral Daily  . valACYclovir  500 mg Oral Daily  . vancomycin  500 mg Intravenous Q12H  . warfarin  3 mg Oral ONCE-1800  . Warfarin - Pharmacist Dosing Inpatient   Does not apply q1800    Continuous Infusions:   Past Medical History  Diagnosis Date  . Hypertension   . Renal insufficiency   . Osteopenia   . Allergy   . Heart murmur   . GERD (gastroesophageal reflux disease)   . Anemia   . Hypercalcemia 03/27/2013  . Non-Hodgkins lymphoma 04/06/2013  . Diastolic  dysfunction, grade I 04/12/2013    Past Surgical History  Procedure Laterality Date  . Appendectomy    . Colonoscopy  2008  . Spine surgery    . Back surgery      LOWER BACK TWICE  . Abdominal hysterectomy    . Esophagogastroduodenoscopy N/A 03/29/2013    Procedure: ESOPHAGOGASTRODUODENOSCOPY (EGD);  Surgeon: Juanita Craver, MD;  Location: [facility] ENDOSCOPY;  Service: Endoscopy;  Laterality: N/A;  . Inguinal lymph node biopsy Right 03/31/2013    Procedure: INGUINAL LYMPH NODE BIOPSY;  Surgeon: Gwenyth Ober, MD;  Location: [place];  Service: General;  Laterality: Right;    Antonieta Iba, RD, LDN Clinical Inpatient Dietitian Pager:  [phone] Weekend and after hours pager:  [phone]

## 2013-08-24 ENCOUNTER — Telehealth: Payer: Self-pay | Admitting: Internal Medicine

## 2013-08-24 ENCOUNTER — Telehealth: Payer: Self-pay

## 2013-08-24 DIAGNOSIS — I2699 Other pulmonary embolism without acute cor pulmonale: Secondary | ICD-10-CM | POA: Diagnosis not present

## 2013-08-24 DIAGNOSIS — D696 Thrombocytopenia, unspecified: Secondary | ICD-10-CM | POA: Diagnosis not present

## 2013-08-24 DIAGNOSIS — J1100 Influenza due to unidentified influenza virus with unspecified type of pneumonia: Secondary | ICD-10-CM | POA: Diagnosis not present

## 2013-08-24 DIAGNOSIS — D638 Anemia in other chronic diseases classified elsewhere: Secondary | ICD-10-CM | POA: Diagnosis not present

## 2013-08-24 DIAGNOSIS — D6481 Anemia due to antineoplastic chemotherapy: Secondary | ICD-10-CM | POA: Diagnosis not present

## 2013-08-24 DIAGNOSIS — J111 Influenza due to unidentified influenza virus with other respiratory manifestations: Secondary | ICD-10-CM

## 2013-08-24 DIAGNOSIS — F509 Eating disorder, unspecified: Secondary | ICD-10-CM

## 2013-08-24 DIAGNOSIS — C8589 Other specified types of non-Hodgkin lymphoma, extranodal and solid organ sites: Secondary | ICD-10-CM | POA: Diagnosis not present

## 2013-08-24 DIAGNOSIS — R05 Cough: Secondary | ICD-10-CM | POA: Diagnosis not present

## 2013-08-24 LAB — CULTURE, BLOOD (ROUTINE X 2)

## 2013-08-24 LAB — CBC WITH DIFFERENTIAL/PLATELET
BASOS ABS: 0 10*3/uL (ref 0.0–0.1)
BASOS PCT: 0 % (ref 0–1)
EOS ABS: 0.1 10*3/uL (ref 0.0–0.7)
Eosinophils Relative: 1 % (ref 0–5)
HCT: 24.8 % — ABNORMAL LOW (ref 36.0–46.0)
HEMOGLOBIN: 8.3 g/dL — AB (ref 12.0–15.0)
Lymphocytes Relative: 6 % — ABNORMAL LOW (ref 12–46)
Lymphs Abs: 0.4 10*3/uL — ABNORMAL LOW (ref 0.7–4.0)
MCH: 31.1 pg (ref 26.0–34.0)
MCHC: 33.5 g/dL (ref 30.0–36.0)
MCV: 92.9 fL (ref 78.0–100.0)
MONO ABS: 0.1 10*3/uL (ref 0.1–1.0)
Monocytes Relative: 1 % — ABNORMAL LOW (ref 3–12)
NEUTROS PCT: 92 % — AB (ref 43–77)
Neutro Abs: 7.2 10*3/uL (ref 1.7–7.7)
Platelets: 64 10*3/uL — ABNORMAL LOW (ref 150–400)
RBC: 2.67 MIL/uL — ABNORMAL LOW (ref 3.87–5.11)
RDW: 15.1 % (ref 11.5–15.5)
WBC: 7.8 10*3/uL (ref 4.0–10.5)

## 2013-08-24 LAB — PROTIME-INR
INR: 1.58 — ABNORMAL HIGH (ref 0.00–1.49)
PROTHROMBIN TIME: 18.4 s — AB (ref 11.6–15.2)

## 2013-08-24 MED ORDER — WARFARIN SODIUM 3 MG PO TABS
3.0000 mg | ORAL_TABLET | Freq: Once | ORAL | Status: AC
  Administered 2013-08-24: 3 mg via ORAL
  Filled 2013-08-24: qty 1

## 2013-08-24 NOTE — Progress Notes (Signed)
Tonya Wise   DOB:Feb 17, 1941   FH#:[id number]   PQD#:[id number]  Day #3 of Vancomycin, Maxipime Day #2 of Tamiflu today  Subjective:  Patient still has cough, mainly non-productive.  No acute overnight events noted.     Husband at bedside.   Yesterday, positive for Influenzae. In droplet isolation.   Objective:  Filed Vitals:   08/24/13 0458  BP: 141/76  Pulse: 80  Temp: 97.8 F (36.6 C)  Resp: 18    Body mass index is 23.41 kg/(m^2).  Intake/Output Summary (Last 24 hours) at 08/24/13 1149 Last data filed at 08/24/13 0459  Gross per 24 hour  Intake    700 ml  Output   1100 ml  Net   -400 ml    Laying in bed; AAOx3. + R sided Port-a-cath  Sclerae unicteric  Oropharynx clear, no lip lesions  No peripheral adenopathy  Lungs slight decreased bs at bases, no rales or rhonchi (wob much improved from prior exam)  Heart regular rate and rhythm  Abdomen benign  MSK no peripheral edema  Neuro nonfocal   Labs:  Lab Results  Component Value Date   WBC 14.6* 08/23/2013   HGB 8.0* 08/23/2013   HCT 23.9* 08/23/2013   MCV 94.1 08/23/2013   PLT 53* 08/23/2013   NEUTROABS 14.0* 9/40/7680   Basic Metabolic Panel:  Recent Labs Lab 08/21/13 1535 08/22/13 0405 08/23/13 1633  NA 140 138 140  K 3.7 3.5* 3.6*  CL 104 104 104  CO2 24 25 24   GLUCOSE 98 83 97  BUN 10 12 11   CREATININE 0.94 1.05 0.84  CALCIUM 8.4 7.6* 7.9*   GFR Estimated Creatinine Clearance: 47.9 ml/min (by C-G formula based on Cr of 0.84). Liver Function Tests:  Recent Labs Lab 08/21/13 1535  AST 20  ALT 8  ALKPHOS 59  BILITOT 0.6  PROT 5.3*  ALBUMIN 3.0*   Coagulation profile  Recent Labs Lab 08/21/13 1535 08/22/13 0405 08/23/13 0645 08/24/13 0500  INR 1.83* 1.80* 1.51* 1.58*    CBC:  Recent Labs Lab 08/21/13 1535 08/22/13 0405 08/23/13 0645  WBC 6.9 4.7 14.6*  NEUTROABS 6.3  --  14.0*  HGB 10.1* 8.4* 8.0*  HCT 30.5* 25.6* 23.9*  MCV 93.0 93.8 94.1  PLT 84* 60* 53*   Thyroid  function studies  Recent Labs  08/21/13 1905  TSH 1.925   Results for AJANAE, VIRAG (MRN [id number]) as of 08/22/2013 11:28  Ref. Range 08/21/2013 19:05  Pro B Natriuretic peptide (BNP) Latest Range: 0-125 pg/mL 21795.0 (H)   Results for ADREAM, PARZYCH (MRN [id number]) as of 08/22/2013 11:28  Ref. Range 08/21/2013 19:05  TSH Latest Range: 0.350-4.500 uIU/mL 1.925  HIV Latest Range: NON REACTIVE  NON REACTIVE   Results for RAELYN, RACETTE (MRN [id number]) as of 08/24/2013 11:50  Ref. Range 08/22/2013 18:13  Influenza A By PCR Latest Range: NEGATIVE  POSITIVE (A)  Influenza B By PCR Latest Range: NEGATIVE  NEGATIVE  H1N1 flu by pcr Latest Range: NOT DETECTED  NOT DETECTED   Taken from Left antecubital. 1 of 2 Bottles with STAPHYLOCOCCUS SPECIES (COAGULASE NEGATIVE) Note: THE SIGNIFICANCE OF ISOLATING THIS ORGANISM FROM A SINGLE SET OF BLOOD CULTURES WHEN MULTIPLE SETS ARE DRAWN IS UNCERTAIN. PLEASE NOTIFY THE MICROBIOLOGY DEPARTMENT WITHIN ONE WEEK IF SPECIATION AND SENSITIVITIES ARE REQUIRED.  Studies:  No results found.  Assessment: 73 y.o. with the following:  #1. HCAP, staph coag negative likely contaminant, + Influenzae A.  --Agree with broad spectrum  antibiotics per PCP.  --Follow-up cultures.  Await sensitivities.  --Oxygen as needed.   #2. DLBCL, stage IV with bone marrow involvement --We will hold any further chemotherapy due to #1.  Allow count recovery.  --Given one neupogen on 01/24.  Resume prn. Need CBC to determine if counts are low and when to administer neupogen. Expect counts to nadir around 1/28.   #3. S/p mini R-CEO on 01/21; Cycle #4 of planned 6. --She received 100mg  PO of ectoposide on Day#2 (1/22); 50 mg PO of etoposide on Day#3 (1/23 in am);  Vincristine 1 mg,Cyclophosphamide 400 mg/m(2), Rituximab 375 mg/m(2)  On day #1.  Prednisone was held due to psychosis, confusion and at patient's request.   #4. Diastolic/systolic CHF (EF 48-88%). --Doxorubicin was  discontinued due to worsening congestive heart failure.  BNP noted.  Avoid overhydration.  Daily weights, Intake and outake.  She is evolemic .   #5. Acute on Chronic cough, certainly 2/2 #1. --Continue supportive care.  Patient reports chest discomfort secondary to chronic coughing.  Add mild pain relief.    #6. Anemia/Thrombocytopenia secondary to #3.  --Transfuse pRBC prn symptoms; transfuse plts if less than 10,000 or presences of bleeding.   --Hold a/c when plts less than 50,000 as the risk of bleeding is high. CBC daily.   #7. PE (05/2013). --On coumadin.  Goal INR  2-3.  Planned treatment of six months.  --As noted above, hold a/c when plts less than 50K.  INR daily.   #8. Hypokalemia, mild.  --Added KCL 10 meQ daily as an outpatient    #9. 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 --Continue valcyte prophylaxis and diflucan prn  #10. Poor nutrition.  Disposition.  Full. Discussed with Dr. Charlies Silvers.    Killian Ress, MD 08/24/2013  11:49 AM

## 2013-08-24 NOTE — Telephone Encounter (Signed)
Pt. admitted  to hospital through ED with pneumonia.

## 2013-08-24 NOTE — Telephone Encounter (Signed)
Message copied by Baruch Merl on Mon Aug 24, 2013  2:37 PM ------      Message from: Gretchen Short R      Created: Thu Aug 20, 2013  3:46 PM      Regarding: chemo follow up call       IV chemo1/21/15, then oral VP-16 on 1/22 and 1/23, neulasta 1/24.      Dr. Juliann Mule    Husband cell [phone]. ------

## 2013-08-24 NOTE — Telephone Encounter (Signed)
s.w. pt husband and advised on Jan appt....they will pick up Feb appt on Jan appt

## 2013-08-24 NOTE — Progress Notes (Signed)
[facility] PROGRESS NOTE  Tonya Wise [id number] DOB: 27-Apr-1941 DOA: 08/21/2013 PCP: [name], MD  Brief narrative: Pt is 73 yo female with past medical history of NHL, under the care of Dr. Juliann Mule, combined diastolic and systolic CHF, grade II, EF 40%, who presented to [facility] ED 08/21/2013 with main concern of several days duration of progressively worsening shortness of breath associated with non productive cough, subjective fevers, chills, malaise, poor oral intake.  In ED, pt noted to have oxygen saturation 90% on RA, CXR findings worrisome for PNA. Pt started on broad spectrum antibiotics in ED.  Hospital course complicated due to findings of possible influenza pneumonia.  Assessment and Plan:   Principal Problem:  Acute hypoxic respiratory failure  - secondary to influenza pneumonia  - influenza positive by PCR; started tamiflu 08/23/2013  - strep pneumo negative, legionella negative  - blood cultures since admission -staph coag neg species indicating contaminant - continue vancomycin and cefepime for now until results of repeat blood cultures are back so we can make usre no bacteremia. - oxygen support via nasal canula to keep O2 saturation above 90%  - d/c IV fluids, eating relatively ok Active Problems:  HCAP and Influenza pneumonia  - on vanco and cefepime - added tamiflu for influenza positive PCR  - BD as needed   UTI  - secondary to Klebsiella - follow up on sensitivity report  - on vanco and cefepime  DLBCL with bone marrow involvement  - per oncology management; appreciate oncology following  Anemia of chronic disease  - secondary to history of malignancy and sequela of chemotherapy  Thrombocytopenia  - secondary to history of malignancy and sequela of chemotherapy  - transfuse if platelet count less than 10 or if bleeding  PE  - continue Coumadin per pharmacy but close attention to platelets; stop if platelet count less than 50  HTN  -  continue bisoprolol, losartan  Combined systolic and diastolic CHF  - careful hydration with NS @ 50 cc/hr  - daily weights and I's and O's  Moderate protein calorie malnutrition  - nutrition consulted   Code Status: Full  Family Communication: Patient's husband at the bedside  Disposition Plan: Home when stable   Consultants:  Oncology (Dr. Juliann Mule) Procedures:  None  Antibiotics:  Vancomycin 08/21/2013 -->  Cefepime 08/21/2013 -->  Tamiflu 08/23/2013 -->    Leisa Lenz, MD  [facility] Pager [phone]  If 7PM-7AM, please contact night-coverage [email or web address] Password [email or web address] 08/24/2013, 4:20 PM   LOS: 3 days    HPI/Subjective: Feels better.  Objective: Filed Vitals:   08/23/13 1500 08/23/13 2041 08/24/13 0458 08/24/13 1234  BP: 133/65 137/62 141/76 148/73  Pulse: 90 97 80 90  Temp: 100.4 F (38 C) 99.1 F (37.3 C) 97.8 F (36.6 C) 98.7 F (37.1 C)  TempSrc: Oral Oral Oral Oral  Resp: 16 16 18 20   Height:      Weight:      SpO2: 97% 97% 98% 97%    Intake/Output Summary (Last 24 hours) at 08/24/13 1620 Last data filed at 08/24/13 0459  Gross per 24 hour  Intake    460 ml  Output    700 ml  Net   -240 ml    Exam:   General:  Pt is alert, follows commands appropriately, not in acute distress  Cardiovascular: Regular rate and rhythm, S1/S2, no murmurs, no rubs, no gallops  Respiratory: Clear to auscultation bilaterally, no wheezing, no crackles, no rhonchi  Abdomen:  Soft, non tender, non distended, bowel sounds present, no guarding  Extremities: No edema, pulses DP and PT palpable bilaterally  Neuro: Grossly nonfocal  Data Reviewed: Basic Metabolic Panel:  Recent Labs Lab 08/21/13 1535 08/22/13 0405 08/23/13 1633  NA 140 138 140  K 3.7 3.5* 3.6*  CL 104 104 104  CO2 24 25 24   GLUCOSE 98 83 97  BUN 10 12 11   CREATININE 0.94 1.05 0.84  CALCIUM 8.4 7.6* 7.9*   Liver Function Tests:  Recent Labs Lab 08/21/13 1535  AST 20  ALT 8   ALKPHOS 59  BILITOT 0.6  PROT 5.3*  ALBUMIN 3.0*   No results found for this basename: LIPASE, AMYLASE,  in the last 168 hours No results found for this basename: AMMONIA,  in the last 168 hours CBC:  Recent Labs Lab 08/21/13 1535 08/22/13 0405 08/23/13 0645 08/24/13 1240  WBC 6.9 4.7 14.6* 7.8  NEUTROABS 6.3  --  14.0* 7.2  HGB 10.1* 8.4* 8.0* 8.3*  HCT 30.5* 25.6* 23.9* 24.8*  MCV 93.0 93.8 94.1 92.9  PLT 84* 60* 53* 64*   Cardiac Enzymes: No results found for this basename: CKTOTAL, CKMB, CKMBINDEX, TROPONINI,  in the last 168 hours BNP: No components found with this basename: POCBNP,  CBG: No results found for this basename: GLUCAP,  in the last 168 hours  Recent Results (from the past 240 hour(s))  CULTURE, BLOOD (ROUTINE X 2)     Status: None   Collection Time    08/21/13  2:46 PM      Result Value Range Status   Specimen Description BLOOD LEFT ANTECUBITAL   Final   Special Requests BOTTLES DRAWN AEROBIC AND ANAEROBIC 5CC   Final   Culture  Setup Time     Final   Value: 08/21/2013 20:24     Performed at [facility]   Culture     Final   Value: STAPHYLOCOCCUS SPECIES (COAGULASE NEGATIVE)     Note: THE SIGNIFICANCE OF ISOLATING THIS ORGANISM FROM A SINGLE SET OF BLOOD CULTURES WHEN MULTIPLE SETS ARE DRAWN IS UNCERTAIN. PLEASE NOTIFY THE MICROBIOLOGY DEPARTMENT WITHIN ONE WEEK IF SPECIATION AND SENSITIVITIES ARE REQUIRED.     Note: Gram Stain Report Called to,Read Back By and Verified With: [facility] ANDERSON 08/22/2013 2135PM MITCV     Performed at [facility]   Report Status 08/24/2013 FINAL   Final  URINE CULTURE     Status: None   Collection Time    08/21/13  3:16 PM      Result Value Range Status   Specimen Description URINE, CLEAN CATCH   Final   Special Requests A   Final   Culture  Setup Time     Final   Value: 08/21/2013 21:56     Performed at [place]     Final   Value: 10,000 COLONIES/ML     Performed at  [facility]   Culture     Final   Value: KLEBSIELLA OZAENAE     Performed at [facility]   Report Status 08/23/2013 FINAL   Final   Organism ID, Bacteria KLEBSIELLA OZAENAE   Final  CULTURE, BLOOD (ROUTINE X 2)     Status: None   Collection Time    08/21/13  3:35 PM      Result Value Range Status   Specimen Description BLOOD RIGHT ANTECUBITAL   Final   Special Requests BOTTLES DRAWN AEROBIC AND  ANAEROBIC 5CC   Final   Culture  Setup Time     Final   Value: 08/21/2013 20:25     Performed at [facility]   Culture     Final   Value:        BLOOD CULTURE RECEIVED NO GROWTH TO DATE CULTURE WILL BE HELD FOR 5 DAYS BEFORE ISSUING A FINAL NEGATIVE REPORT     Performed at [facility]   Report Status PENDING   Incomplete  CULTURE, EXPECTORATED SPUTUM-ASSESSMENT     Status: None   Collection Time    08/22/13  6:10 PM      Result Value Range Status   Specimen Description SPUTUM   Final   Special Requests NONE   Final   Sputum evaluation     Final   Value: THIS SPECIMEN IS ACCEPTABLE. RESPIRATORY CULTURE REPORT TO FOLLOW.   Report Status 08/22/2013 FINAL   Final  CULTURE, RESPIRATORY (NON-EXPECTORATED)     Status: None   Collection Time    08/22/13  6:10 PM      Result Value Range Status   Specimen Description SPUTUM   Final   Special Requests NONE   Final   Gram Stain     Final   Value: ABUNDANT WBC PRESENT, PREDOMINANTLY PMN     RARE SQUAMOUS EPITHELIAL CELLS PRESENT     RARE GRAM POSITIVE COCCI IN PAIRS     Performed at [facility]   Culture     Final   Value: Culture reincubated for better growth     Performed at [facility]   Report Status PENDING   Incomplete     Studies: No results found.  Scheduled Meds: . bisoprolol  10 mg Oral Daily  . ceFEPime (MAXIPIME) IV  2 g Intravenous Q24H  . feeding supplement (RESOURCE BREEZE)  1 Container Oral BID BM  . guaiFENesin  600 mg Oral BID  . losartan  25 mg Oral BID  .  oseltamivir  75 mg Oral Daily  . pantoprazole  40 mg Oral Daily  . potassium chloride  10 mEq Oral Daily  . valACYclovir  500 mg Oral Daily  . vancomycin  500 mg Intravenous Q12H  . warfarin  3 mg Oral ONCE-1800  . Warfarin - Pharmacist Dosing Inpatient   Does not apply q1800   Continuous Infusions:

## 2013-08-24 NOTE — Progress Notes (Signed)
ANTICOAGULATION CONSULT NOTE - Follow Up Consult  Pharmacy Consult for Coumadin Indication: recent pulmonary embolism  Allergies  Allergen Reactions  . 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 Pt was started on Lovenox bridge to Coumadin. Currently being followed by anticoag clinic at [facility]. Of note, on 1/15, pt's INR was 6.85 on Coumadin 2mg  x 2 days then 4mg  daily. Coumadin held 1/16 - 1/18. Repeat INR on 1/19 wnl at 2.4. Then patient instructed to start Coumadin 2mg  daily, last dose on 1/23. On admission, pt's INR is 1.83. MD ordered to resume Coumadin inpatient.   INR 1.58 today.   Platelets up to 64k. Per MD notes, to hold Coumadin if platelets dropped < 50K.   Hgb 8.3, stable.   Will continue with conservative warfarin dosing due to recent high INR as outpatient  Goal of Therapy:  INR 2-3 Monitor platelets by anticoagulation protocol: Yes   Plan:   Coumadin 3mg  po x 1 tonight  Monitor platelets closely and stop Coumadin if platelets < 50K   CBC in AM  Daily PT/INR  Pharmacy will f/u  Thank you for the consult.  Johny Drilling, PharmD, BCPS Clinical Pharmacist Pager: [phone] Pharmacy: [phone] 08/24/2013 1:20 PM

## 2013-08-25 ENCOUNTER — Ambulatory Visit: Payer: Medicare Other

## 2013-08-25 DIAGNOSIS — I519 Heart disease, unspecified: Secondary | ICD-10-CM | POA: Diagnosis not present

## 2013-08-25 DIAGNOSIS — J1100 Influenza due to unidentified influenza virus with unspecified type of pneumonia: Secondary | ICD-10-CM | POA: Diagnosis not present

## 2013-08-25 DIAGNOSIS — D6481 Anemia due to antineoplastic chemotherapy: Secondary | ICD-10-CM | POA: Diagnosis not present

## 2013-08-25 DIAGNOSIS — C8589 Other specified types of non-Hodgkin lymphoma, extranodal and solid organ sites: Secondary | ICD-10-CM | POA: Diagnosis not present

## 2013-08-25 DIAGNOSIS — D638 Anemia in other chronic diseases classified elsewhere: Secondary | ICD-10-CM | POA: Diagnosis not present

## 2013-08-25 DIAGNOSIS — D696 Thrombocytopenia, unspecified: Secondary | ICD-10-CM | POA: Diagnosis not present

## 2013-08-25 DIAGNOSIS — R05 Cough: Secondary | ICD-10-CM | POA: Diagnosis not present

## 2013-08-25 LAB — CBC
HCT: 22.6 % — ABNORMAL LOW (ref 36.0–46.0)
Hemoglobin: 7.6 g/dL — ABNORMAL LOW (ref 12.0–15.0)
MCH: 31 pg (ref 26.0–34.0)
MCHC: 33.6 g/dL (ref 30.0–36.0)
MCV: 92.2 fL (ref 78.0–100.0)
PLATELETS: 50 10*3/uL — AB (ref 150–400)
RBC: 2.45 MIL/uL — ABNORMAL LOW (ref 3.87–5.11)
RDW: 15 % (ref 11.5–15.5)
WBC: 3.5 10*3/uL — ABNORMAL LOW (ref 4.0–10.5)

## 2013-08-25 LAB — PREPARE RBC (CROSSMATCH)

## 2013-08-25 LAB — CULTURE, RESPIRATORY: CULTURE: NORMAL

## 2013-08-25 LAB — PROTIME-INR
INR: 1.84 — AB (ref 0.00–1.49)
PROTHROMBIN TIME: 20.7 s — AB (ref 11.6–15.2)

## 2013-08-25 LAB — CULTURE, RESPIRATORY W GRAM STAIN

## 2013-08-25 MED ORDER — FUROSEMIDE 10 MG/ML IJ SOLN
20.0000 mg | Freq: Once | INTRAMUSCULAR | Status: AC
  Administered 2013-08-25: 20 mg via INTRAVENOUS
  Filled 2013-08-25: qty 2

## 2013-08-25 MED ORDER — WARFARIN SODIUM 3 MG PO TABS
3.0000 mg | ORAL_TABLET | Freq: Once | ORAL | Status: AC
  Administered 2013-08-25: 3 mg via ORAL
  Filled 2013-08-25: qty 1

## 2013-08-25 MED ORDER — FILGRASTIM 480 MCG/1.6ML IJ SOLN
480.0000 ug | Freq: Every day | INTRAMUSCULAR | Status: DC
  Administered 2013-08-25: 480 ug via SUBCUTANEOUS
  Filled 2013-08-25 (×2): qty 1.6

## 2013-08-25 NOTE — Progress Notes (Signed)
Tonya Wise   DOB:04/24/1972   XB#:[id number]   AST#:[id number]  Day #4 of Vancomycin, Maxipime Day #3 of Tamiflu today  Subjective:  Patient still has cough, mainly non-productive.  No acute overnight events noted.     Husband at bedside.     Objective:  Filed Vitals:   08/25/13 1711  BP: 121/98  Pulse: 80  Temp: 98.5 F (36.9 C)  Resp: 16    Body mass index is 23.41 kg/(m^2).  Intake/Output Summary (Last 24 hours) at 08/25/13 1813 Last data filed at 08/25/13 1711  Gross per 24 hour  Intake  622.5 ml  Output   2000 ml  Net -1377.5 ml    Sitting in chair; AAOx3. + R sided Port-a-cath  Sclerae unicteric  Oropharynx clear, no lip lesions  No peripheral adenopathy  Lungs slight decreased bs at bases, no rales or rhonchi  Heart regular rate and rhythm  Abdomen benign +BS, non-distended  MSK no peripheral edema  Neuro nonfocal   Labs:  Lab Results  Component Value Date   WBC 3.5* 08/25/2013   HGB 7.6* 08/25/2013   HCT 22.6* 08/25/2013   MCV 92.2 08/25/2013   PLT 50* 08/25/2013   NEUTROABS 7.2 9/89/2119   Basic Metabolic Panel:  Recent Labs Lab 08/21/13 1535 08/22/13 0405 08/23/13 1633  NA 140 138 140  K 3.7 3.5* 3.6*  CL 104 104 104  CO2 24 25 24   GLUCOSE 98 83 97  BUN 10 12 11   CREATININE 0.94 1.05 0.84  CALCIUM 8.4 7.6* 7.9*   GFR Estimated Creatinine Clearance: 47.9 ml/min (by C-G formula based on Cr of 0.84). Liver Function Tests:  Recent Labs Lab 08/21/13 1535  AST 20  ALT 8  ALKPHOS 59  BILITOT 0.6  PROT 5.3*  ALBUMIN 3.0*   Coagulation profile  Recent Labs Lab 08/21/13 1535 08/22/13 0405 08/23/13 0645 08/24/13 0500 08/25/13 0630  INR 1.83* 1.80* 1.51* 1.58* 1.84*    CBC:  Recent Labs Lab 08/21/13 1535 08/22/13 0405 08/23/13 0645 08/24/13 1240 08/25/13 0500  WBC 6.9 4.7 14.6* 7.8 3.5*  NEUTROABS 6.3  --  14.0* 7.2  --   HGB 10.1* 8.4* 8.0* 8.3* 7.6*  HCT 30.5* 25.6* 23.9* 24.8* 22.6*  MCV 93.0 93.8 94.1 92.9 92.2  PLT 84*  60* 53* 64* 50*   Results for LYNANN, DEMETRIUS (MRN [id number]) as of 08/22/2013 11:28  Ref. Range 08/21/2013 19:05  Pro B Natriuretic peptide (BNP) Latest Range: 0-125 pg/mL 21795.0 (H)   Results for GURPREET, MARIANI (MRN [id number]) as of 08/22/2013 11:28  Ref. Range 08/21/2013 19:05  TSH Latest Range: 0.350-4.500 uIU/mL 1.925  HIV Latest Range: NON REACTIVE  NON REACTIVE   Results for LACRESHIA, BONDARENKO (MRN [id number]) as of 08/24/2013 11:50  Ref. Range 08/22/2013 18:13  Influenza A By PCR Latest Range: NEGATIVE  POSITIVE (A)  Influenza B By PCR Latest Range: NEGATIVE  NEGATIVE  H1N1 flu by pcr Latest Range: NOT DETECTED  NOT DETECTED     Studies:  No results found.  Assessment: 73 y.o. with the following:  #1. HCAP, staph coag negative likely contaminant, + Influenzae A.  --Agree with broad spectrum antibiotics per PCP.  --Follow-up cultures.  Await respiratory culture sensitivities.  --Oxygen as needed.   #2. DLBCL, stage IV with bone marrow involvement --We will hold any further chemotherapy due to #1.  Allow count recovery.  --Given one neupogen on 01/24. Will given neupogen on 01/27.  Neulasta possible as an outpatient.     #  3. S/p mini R-CEO on 01/21; Cycle #4 of planned 6. --She received 100mg  PO of ectoposide on Day#2 (1/22); 50 mg PO of etoposide on Day#3 (1/23 in am);  Vincristine 1 mg,Cyclophosphamide 400 mg/m(2), Rituximab 375 mg/m(2)  On day #1.  Prednisone was held due to psychosis, confusion and at patient's request.   #4. Diastolic/systolic CHF (EF 73-41%). --Doxorubicin was discontinued due to worsening congestive heart failure.  BNP noted.  Avoid overhydration.  Daily weights, Intake and outake.  She is evolemic .   #5. Acute on Chronic cough, certainly 2/2 #1. --Continue supportive care.  Patient reports chest discomfort secondary to chronic coughing.  Add mild pain relief.    #6. Anemia/Thrombocytopenia secondary to #3.  --Transfuse pRBC prn symptoms; transfuse  plts if less than 10,000 or presences of bleeding.  Please give lasix with transfusions secondary to #4.  --Hold a/c when plts less than 50,000 as the risk of bleeding is high. CBC daily.   #7. PE (05/2013). --On coumadin.  Goal INR  2-3.  Planned treatment of six months.  --As noted above, hold a/c when plts less than 50K.  INR daily.   #8. Hypokalemia, mild.  --Added KCL 10 meQ daily as an outpatient    #9. 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 --Continue valcyte prophylaxis and diflucan prn  #10. Poor nutrition.  Disposition.  Full. Discussed with Dr. Charlies Silvers.   LOS: 3 days  Gibran Veselka, MD 08/25/2013  6:13 PM

## 2013-08-25 NOTE — Progress Notes (Addendum)
[facility] PROGRESS NOTE  Tonya Wise [id number] DOB: 06-12-1941 DOA: 08/21/2013 PCP: [name], MD  Brief narrative: Pt is 73 yo female with past medical history of NHL, under the care of Dr. Juliann Mule, combined diastolic and systolic CHF, grade II, EF 40%, who presented to [facility] ED 08/21/2013 with main concern of several days duration of progressively worsening shortness of breath associated with non productive cough, subjective fevers, chills, malaise, poor oral intake.  In ED, pt noted to have oxygen saturation 90% on RA, CXR findings worrisome for PNA. Pt started on broad spectrum antibiotics in ED. She was subsequently found to have influenza pneumonia and Klebsiella UTI.  Assessment and Plan:   Principal Problem:  Acute hypoxic respiratory failure  - secondary to influenza pneumonia  - influenza positive by PCR; started tamiflu 08/23/2013  - strep pneumo negative, legionella negative  - blood cultures since admission -staph coag neg species indicating contaminant  - continue vancomycin and cefepime for now; no fever but is neutropenic - oxygen support via nasal canula to keep O2 saturation above 90%   Active Problems:  HCAP and Influenza pneumonia  - on vanco and cefepime  - added tamiflu for influenza positive PCR on 08/23/2013 UTI  - secondary to Klebsiella, pansensitive - on vanco and cefepime which most likely can be transitioned to one abx perhaps levaquin Neutropenia - no fever but she is on antibiotics - will give filgrastim today per oncology recomendations DLBCL with bone marrow involvement  - per oncology management; appreciate oncology following  - valacyclovir for HSV prophylaxis  Anemia of chronic disease  - secondary to history of malignancy and sequela of chemotherapy  - hemoglobin this am 7.7 so will give 1 unit PRBC Thrombocytopenia  - secondary to history of malignancy and sequela of chemotherapy  - platelet count 50 this am - transfuse if  platelet count less than 10 or if bleeding  PE  - continue Coumadin per pharmacy but close attention to platelets; stop if platelet count less than 50  HTN  - continue bisoprolol, losartan  Combined systolic and diastolic CHF  - compensated - daily weights and I's and O's  - after blood transfusion will give 1 dose of IV lasix  Moderate protein calorie malnutrition  - secondary to history of malignancy  - nutrition consulted   Code Status: Full  Family Communication: Patient's husband at the bedside  Disposition Plan: Home when stable   Consultants:  Oncology (Dr. Juliann Mule) Procedures:  None  Antibiotics:  Vancomycin 08/21/2013 -->  Cefepime 08/21/2013 -->  Tamiflu 08/23/2013 -->  Leisa Lenz, MD  [facility] Pager [phone]  If 7PM-7AM, please contact night-coverage [email or web address] Password [email or web address] 08/25/2013, 5:14 PM   LOS: 4 days   HPI/Subjective: Feels better.  Objective: Filed Vitals:   08/25/13 0050 08/25/13 1410 08/25/13 1609 08/25/13 1711  BP: 125/67 165/77 136/85 121/98  Pulse: 79 88 87 80  Temp: 98.1 F (36.7 C) 98.2 F (36.8 C) 98.1 F (36.7 C) 98.5 F (36.9 C)  TempSrc: Oral Oral Oral Oral  Resp: 20 16 16 16   Height:      Weight:      SpO2: 92% 99%      Intake/Output Summary (Last 24 hours) at 08/25/13 1714 Last data filed at 08/25/13 1711  Gross per 24 hour  Intake  622.5 ml  Output   2000 ml  Net -1377.5 ml    Exam:   General:  Pt is alert, follows commands appropriately, not in  acute distress  Cardiovascular: Regular rate and rhythm, S1/S2, no murmurs, no rubs, no gallops  Respiratory: Clear to auscultation bilaterally, no wheezing, no crackles, no rhonchi  Abdomen: Soft, non tender, non distended, bowel sounds present, no guarding  Extremities: No edema, pulses DP and PT palpable bilaterally  Neuro: Grossly nonfocal  Data Reviewed: Basic Metabolic Panel:  Recent Labs Lab 08/21/13 1535 08/22/13 0405 08/23/13 1633  NA  140 138 140  K 3.7 3.5* 3.6*  CL 104 104 104  CO2 24 25 24   GLUCOSE 98 83 97  BUN 10 12 11   CREATININE 0.94 1.05 0.84  CALCIUM 8.4 7.6* 7.9*   Liver Function Tests:  Recent Labs Lab 08/21/13 1535  AST 20  ALT 8  ALKPHOS 59  BILITOT 0.6  PROT 5.3*  ALBUMIN 3.0*   No results found for this basename: LIPASE, AMYLASE,  in the last 168 hours No results found for this basename: AMMONIA,  in the last 168 hours CBC:  Recent Labs Lab 08/21/13 1535 08/22/13 0405 08/23/13 0645 08/24/13 1240 08/25/13 0500  WBC 6.9 4.7 14.6* 7.8 3.5*  NEUTROABS 6.3  --  14.0* 7.2  --   HGB 10.1* 8.4* 8.0* 8.3* 7.6*  HCT 30.5* 25.6* 23.9* 24.8* 22.6*  MCV 93.0 93.8 94.1 92.9 92.2  PLT 84* 60* 53* 64* 50*   Cardiac Enzymes: No results found for this basename: CKTOTAL, CKMB, CKMBINDEX, TROPONINI,  in the last 168 hours BNP: No components found with this basename: POCBNP,  CBG: No results found for this basename: GLUCAP,  in the last 168 hours  Recent Results (from the past 240 hour(s))  CULTURE, BLOOD (ROUTINE X 2)     Status: None   Collection Time    08/21/13  2:46 PM      Result Value Range Status   Specimen Description BLOOD LEFT ANTECUBITAL   Final   Special Requests BOTTLES DRAWN AEROBIC AND ANAEROBIC 5CC   Final   Culture  Setup Time     Final   Value: 08/21/2013 20:24     Performed at [facility]   Culture     Final   Value: STAPHYLOCOCCUS SPECIES (COAGULASE NEGATIVE)     Note: THE SIGNIFICANCE OF ISOLATING THIS ORGANISM FROM A SINGLE SET OF BLOOD CULTURES WHEN MULTIPLE SETS ARE DRAWN IS UNCERTAIN. PLEASE NOTIFY THE MICROBIOLOGY DEPARTMENT WITHIN ONE WEEK IF SPECIATION AND SENSITIVITIES ARE REQUIRED.     Note: Gram Stain Report Called to,Read Back By and Verified With: [facility] ANDERSON 08/22/2013 2135PM MITCV     Performed at [facility]   Report Status 08/24/2013 FINAL   Final  URINE CULTURE     Status: None   Collection Time    08/21/13  3:16 PM      Result  Value Range Status   Specimen Description URINE, CLEAN CATCH   Final   Special Requests A   Final   Culture  Setup Time     Final   Value: 08/21/2013 21:56     Performed at [place]     Final   Value: 10,000 COLONIES/ML     Performed at [facility]   Culture     Final   Value: KLEBSIELLA OZAENAE     Performed at [facility]   Report Status 08/23/2013 FINAL   Final   Organism ID, Bacteria KLEBSIELLA OZAENAE   Final  CULTURE, BLOOD (ROUTINE X 2)     Status:  None   Collection Time    08/21/13  3:35 PM      Result Value Range Status   Specimen Description BLOOD RIGHT ANTECUBITAL   Final   Special Requests BOTTLES DRAWN AEROBIC AND ANAEROBIC 5CC   Final   Culture  Setup Time     Final   Value: 08/21/2013 20:25     Performed at [facility]   Culture     Final   Value:        BLOOD CULTURE RECEIVED NO GROWTH TO DATE CULTURE WILL BE HELD FOR 5 DAYS BEFORE ISSUING A FINAL NEGATIVE REPORT     Performed at [facility]   Report Status PENDING   Incomplete  CULTURE, EXPECTORATED SPUTUM-ASSESSMENT     Status: None   Collection Time    08/22/13  6:10 PM      Result Value Range Status   Specimen Description SPUTUM   Final   Special Requests NONE   Final   Sputum evaluation     Final   Value: THIS SPECIMEN IS ACCEPTABLE. RESPIRATORY CULTURE REPORT TO FOLLOW.   Report Status 08/22/2013 FINAL   Final  CULTURE, RESPIRATORY (NON-EXPECTORATED)     Status: None   Collection Time    08/22/13  6:10 PM      Result Value Range Status   Specimen Description SPUTUM   Final   Special Requests NONE   Final   Gram Stain     Final   Value: ABUNDANT WBC PRESENT, PREDOMINANTLY PMN     RARE SQUAMOUS EPITHELIAL CELLS PRESENT     RARE GRAM POSITIVE COCCI IN PAIRS     Performed at [facility]   Culture     Final   Value: NORMAL OROPHARYNGEAL FLORA     Performed at [facility]   Report Status 08/25/2013 FINAL   Final      Studies: No results found.  Scheduled Meds: . bisoprolol  10 mg Oral Daily  . ceFEPime (MAXIPIME) IV  2 g Intravenous Q24H  . feeding supplement (RESOURCE BREEZE)  1 Container Oral BID BM  . filgrastim (NEUPOGEN)  SQ  480 mcg Subcutaneous q1800  . furosemide  20 mg Intravenous Once  . guaiFENesin  600 mg Oral BID  . losartan  25 mg Oral BID  . oseltamivir  75 mg Oral Daily  . pantoprazole  40 mg Oral Daily  . potassium chloride  10 mEq Oral Daily  . valACYclovir  500 mg Oral Daily  . vancomycin  500 mg Intravenous Q12H  . warfarin  3 mg Oral ONCE-1800

## 2013-08-25 NOTE — Progress Notes (Signed)
ANTICOAGULATION CONSULT NOTE - Follow Up Consult  Pharmacy Consult for Coumadin Indication: recent pulmonary embolism  Allergies  Allergen Reactions  . 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 Pt was started on Lovenox bridge to Coumadin. Currently being followed by anticoag clinic at [facility]. Of note, on 1/15, pt's INR was 6.85 on Coumadin 2mg  x 2 days then 4mg  daily. Coumadin held 1/16 - 1/18. Repeat INR on 1/19 wnl at 2.4. Then patient instructed to start Coumadin 2mg  daily, last dose on 1/23. On admission, pt's INR is 1.83. MD ordered to resume Coumadin inpatient.   INR 1.84 today.   Platelets to 50k. Per MD notes, to hold Coumadin if platelets dropped < 50K.   Hgb 7.6, stable.   Will continue with conservative warfarin dosing due to recent high INR as outpatient  Goal of Therapy:  INR 2-3 Monitor platelets by anticoagulation protocol: Yes   Plan:   Coumadin 3mg  po x 1 tonight  Monitor platelets closely and stop Coumadin if platelets < 50K   CBC in AM  Daily PT/INR  Pharmacy will f/u  Thank you for the consult.  Minda Ditto PharmD Pager [phone] 08/25/2013, 8:32 AM

## 2013-08-26 ENCOUNTER — Telehealth: Payer: Self-pay | Admitting: Internal Medicine

## 2013-08-26 ENCOUNTER — Other Ambulatory Visit: Payer: Medicare Other

## 2013-08-26 ENCOUNTER — Other Ambulatory Visit: Payer: Self-pay | Admitting: Internal Medicine

## 2013-08-26 DIAGNOSIS — I5043 Acute on chronic combined systolic (congestive) and diastolic (congestive) heart failure: Secondary | ICD-10-CM | POA: Diagnosis not present

## 2013-08-26 DIAGNOSIS — J189 Pneumonia, unspecified organism: Secondary | ICD-10-CM | POA: Diagnosis not present

## 2013-08-26 DIAGNOSIS — J9601 Acute respiratory failure with hypoxia: Secondary | ICD-10-CM | POA: Diagnosis present

## 2013-08-26 DIAGNOSIS — C8590 Non-Hodgkin lymphoma, unspecified, unspecified site: Secondary | ICD-10-CM

## 2013-08-26 DIAGNOSIS — J9600 Acute respiratory failure, unspecified whether with hypoxia or hypercapnia: Secondary | ICD-10-CM | POA: Diagnosis not present

## 2013-08-26 DIAGNOSIS — D696 Thrombocytopenia, unspecified: Secondary | ICD-10-CM

## 2013-08-26 DIAGNOSIS — B370 Candidal stomatitis: Secondary | ICD-10-CM | POA: Diagnosis present

## 2013-08-26 DIAGNOSIS — I2699 Other pulmonary embolism without acute cor pulmonale: Secondary | ICD-10-CM

## 2013-08-26 DIAGNOSIS — J1100 Influenza due to unidentified influenza virus with unspecified type of pneumonia: Secondary | ICD-10-CM | POA: Diagnosis not present

## 2013-08-26 DIAGNOSIS — Z86711 Personal history of pulmonary embolism: Secondary | ICD-10-CM | POA: Diagnosis present

## 2013-08-26 DIAGNOSIS — E876 Hypokalemia: Secondary | ICD-10-CM | POA: Diagnosis present

## 2013-08-26 DIAGNOSIS — N390 Urinary tract infection, site not specified: Secondary | ICD-10-CM | POA: Diagnosis present

## 2013-08-26 LAB — TYPE AND SCREEN
ABO/RH(D): B POS
ANTIBODY SCREEN: NEGATIVE
Unit division: 0

## 2013-08-26 LAB — BASIC METABOLIC PANEL
BUN: 8 mg/dL (ref 6–23)
CO2: 27 mEq/L (ref 19–32)
Calcium: 8.5 mg/dL (ref 8.4–10.5)
Chloride: 101 mEq/L (ref 96–112)
Creatinine, Ser: 0.81 mg/dL (ref 0.50–1.10)
GFR, EST AFRICAN AMERICAN: 82 mL/min — AB (ref >90)
GFR, EST NON AFRICAN AMERICAN: 71 mL/min — AB (ref >90)
Glucose, Bld: 120 mg/dL — ABNORMAL HIGH (ref 70–99)
Potassium: 3.1 mEq/L — ABNORMAL LOW (ref 3.7–5.3)
SODIUM: 139 meq/L (ref 137–147)

## 2013-08-26 LAB — CBC
HEMATOCRIT: 29.7 % — AB (ref 36.0–46.0)
Hemoglobin: 10.1 g/dL — ABNORMAL LOW (ref 12.0–15.0)
MCH: 30.6 pg (ref 26.0–34.0)
MCHC: 34 g/dL (ref 30.0–36.0)
MCV: 90 fL (ref 78.0–100.0)
Platelets: 55 10*3/uL — ABNORMAL LOW (ref 150–400)
RBC: 3.3 MIL/uL — ABNORMAL LOW (ref 3.87–5.11)
RDW: 15.4 % (ref 11.5–15.5)
WBC: 9.9 10*3/uL (ref 4.0–10.5)

## 2013-08-26 LAB — PROTIME-INR
INR: 1.98 — AB (ref 0.00–1.49)
PROTHROMBIN TIME: 21.9 s — AB (ref 11.6–15.2)

## 2013-08-26 MED ORDER — LEVOFLOXACIN 750 MG PO TABS
750.0000 mg | ORAL_TABLET | ORAL | Status: DC
  Administered 2013-08-26: 750 mg via ORAL
  Filled 2013-08-26: qty 1

## 2013-08-26 MED ORDER — HEPARIN SOD (PORK) LOCK FLUSH 100 UNIT/ML IV SOLN
500.0000 [IU] | INTRAVENOUS | Status: AC | PRN
  Administered 2013-08-26: 500 [IU]
  Filled 2013-08-26: qty 5

## 2013-08-26 MED ORDER — WARFARIN SODIUM 2 MG PO TABS
2.0000 mg | ORAL_TABLET | Freq: Once | ORAL | Status: DC
  Filled 2013-08-26: qty 1

## 2013-08-26 MED ORDER — OSELTAMIVIR PHOSPHATE 75 MG PO CAPS: 75.0000 mg | ORAL_CAPSULE | Freq: Every day | ORAL | Status: DC

## 2013-08-26 MED ORDER — LEVOFLOXACIN 750 MG PO TABS: 750.0000 mg | ORAL_TABLET | ORAL | Status: DC

## 2013-08-26 MED ORDER — POTASSIUM CHLORIDE CRYS ER 20 MEQ PO TBCR
40.0000 meq | EXTENDED_RELEASE_TABLET | Freq: Once | ORAL | Status: AC
  Administered 2013-08-26: 40 meq via ORAL
  Filled 2013-08-26: qty 2

## 2013-08-26 NOTE — Progress Notes (Addendum)
ANTICOAGULATION CONSULT NOTE - Follow Up Consult  Pharmacy Consult for Coumadin Indication: recent pulmonary embolism  Allergies  Allergen Reactions  . 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 Pt was started on Lovenox bridge to Coumadin. Currently being followed by anticoag clinic at [facility]. Of note, on 1/15, pt's INR was 6.85 on Coumadin 2mg  x 2 days then 4mg  daily. Coumadin held 1/16 - 1/18. Repeat INR on 1/19 wnl at 2.4. Then patient instructed to start Coumadin 2mg  daily, last dose on 1/23. On admission, pt's INR is 1.83. MD ordered to resume Coumadin inpatient.   INR 1.98 today.   Platelets 55k. Per MD notes, to hold Coumadin if platelets dropped < 50K.   Hgb 10.1, improved.   Will continue with conservative warfarin dosing due to recent high INR as outpatient  Noted antibiotics to change from vancomycin and cefepime to levofloxacin PO today -- possible interaction with warfarin, may increase INR during concomitant administration  Goal of Therapy:  INR 2-3 Monitor platelets by anticoagulation protocol: Yes   Plan:   Coumadin 2mg  po x 1 tonight  Monitor platelets closely and stop Coumadin if platelets < 50K   CBC in AM  Daily PT/INR  Pharmacy will f/u  Thank you for the consult.  Johny Drilling, PharmD, BCPS Pager: [phone] Pharmacy: [phone] 08/26/2013 8:24 AM

## 2013-08-26 NOTE — Progress Notes (Signed)
Pt discharged home with spouse in stable condition. Discharge instructions given. Scripts sent to pharmacy of choice. Pt and husband verbalized understanding.

## 2013-08-26 NOTE — Progress Notes (Signed)
NUTRITION FOLLOW UP  Intervention:   -Encouraged consumption of Resource Breeze and snacks -Continue with liberalized diet -Offered additional suggestions for increasing kcal/protein intake -Consider addition of appetite stimulant  Nutrition Dx:   Inadequate oral intake related to poor appetite as evidenced by observation and weight loss.- ongoing   Goal:   Pt to meet >/= 90% of their estimated nutrition needs    Monitor:   Total protein/energy intake, labs, weights,  Assessment:   1/25: Patient admitted with HCAP bacteremia. Hx of DLBCL, stage IV with bone marrow involvement currently undergoing chemo.  Patient reports no appetite. Daughter in room. Intake is minimal. Dislikes Ensure and Boost which cause vomiting. Dislikes sweets  1/28: -Pt reported improving appetite, is able to eating >50% of meals -Is eating 100% of snacks, enjoying saltier foods such as crackers and cheese. Is trying to consume more fruits -Has not tried [employer] supplement yet, was going to trial it with carbonated beverage during time of follow up. Dislikes sweets; encouraged mixing supplement to increasing palatability and compliance -MD noted pt receiving diflucan for thrush. Pt noted some improvements -Encouraged snacks of yogurt, cottage cheese, crackers and cheese/peanut butter to increase kcal/protein intake -Denied any n/v -Pt with hypokalemia -Declined any additional nutrition education materials. Reported to have the knowledge, just has difficulty incorporating suggestions into diet d/t minimal appetite. Consider addition of appetite stimulant  Height: Ht Readings from Last 1 Encounters:  08/21/13 5\' 2"  (1.575 m)    Weight Status:   Wt Readings from Last 1 Encounters:  08/21/13 128 lb (58.06 kg)    Estimated needs:  Kcal: 1800-2000  Protein: 85-95 gm  Fluid: >2L daily   Skin: WDL  Diet Order: General   Intake/Output Summary (Last 24 hours) at 08/26/13 1011 Last data  filed at 08/26/13 0818  Gross per 24 hour  Intake  762.5 ml  Output   2650 ml  Net -1887.5 ml    Last BM: 1/28   Labs:   Recent Labs Lab 08/21/13 1535 08/22/13 0405 08/23/13 1633  NA 140 138 140  K 3.7 3.5* 3.6*  CL 104 104 104  CO2 24 25 24   BUN 10 12 11   CREATININE 0.94 1.05 0.84  CALCIUM 8.4 7.6* 7.9*  GLUCOSE 98 83 97    CBG (last 3)  No results found for this basename: GLUCAP,  in the last 72 hours  Scheduled Meds: . bisoprolol  10 mg Oral Daily  . ceFEPime (MAXIPIME) IV  2 g Intravenous Q24H  . feeding supplement (RESOURCE BREEZE)  1 Container Oral BID BM  . filgrastim (NEUPOGEN)  SQ  480 mcg Subcutaneous q1800  . guaiFENesin  600 mg Oral BID  . losartan  25 mg Oral BID  . oseltamivir  75 mg Oral Daily  . pantoprazole  40 mg Oral Daily  . potassium chloride  10 mEq Oral Daily  . valACYclovir  500 mg Oral Daily  . vancomycin  500 mg Intravenous Q12H  . Warfarin - Pharmacist Dosing Inpatient   Does not apply q1800    Continuous Infusions: None  Atlee Abide MS RD LDN Clinical Dietitian HQION:[phone]

## 2013-08-26 NOTE — Telephone Encounter (Signed)
Talked to pt and she is aware of appt tomorrow and labs cancelled for 2/4 per MD

## 2013-08-26 NOTE — Telephone Encounter (Signed)
Talked to pt gave her lab for 2/2/and 2/6, no cancellation order for 2/4, emailed michelle regarding chemo r/s from 2/11 to 2/13, pt aware of all changes

## 2013-08-26 NOTE — Discharge Summary (Signed)
Physician Discharge Summary  Tonya Wise [id number] DOB: 08/06/1940 DOA: 08/21/2013  PCP: [name], MD  Admit date: 08/21/2013 Discharge date: 08/26/2013  Recommendations for Outpatient Follow-up:  1. F/U with PCP for chronic cough.  Advised to take non-sedating anti-histamine to see if this helps. 2. Monitor PT/INR closely while on Levaquin.  Discharge Diagnoses:  Principal Problem:    Acute respiratory failure with hypoxia secondary to influenza A with pneumonia Active Problems:    Non-Hodgkins lymphoma    Protein-calorie malnutrition, severe    Anemia of chronic disease    Acute on chronic combined systolic and diastolic heart failure    Other pulmonary embolism and infarction    Acute on chronic cough    PNA (pneumonia)    Influenza with pneumonia    Thrombocytopenia, unspecified    UTI (urinary tract infection)    History of pulmonary embolism    HTN (hypertension)    Hypokalemia    Thrush  Discharge Condition: Improved.  Diet recommendation: Low-sodium, heart healthy.  History of present illness:  Pt is 73 yo female with a PMH of NHL, under the care of Dr. Juliann Mule, combined diastolic and systolic CHF, grade II, EF 40%, who [facility] ED 08/21/2013 with main concern of several days duration of progressively worsening shortness of breath associated with non productive cough, subjective fevers, chills, malaise, poor oral intake. In ED, pt noted to have oxygen saturation 90% on RA, CXR findings worrisome for PNA.   Hospital Course by problem:  Principal Problem:   Acute respiratory failure with hypoxia secondary to influenza A with pneumonia Patient was admitted and influenza studies were found to be positive. Tamiflu was started on 08/23/2013. Strep pneumonia and Legionella urinary antigens were negative. Blood cultures were negative (1 culture grew coagulase-negative staph, thought to be contaminant). Sputum cultures grew normal oral  pharyngeal flora. Patient was also treated with empiric vancomycin and cefepime. Her hypoxia was addressed with supplemental oxygen to keep her saturations greater than 90%. We will discharge her home on 1 additional day of Tamiflu to complete a five-day course, and Levaquin every other day or 3 more days. Active Problems:   Non-Hodgkins lymphoma Remains under the care of Ch[facility], who saw the patient while in the hospital. Chemotherapy currently on hold to allow for count recovery. She was given Neupogen on 08/22/13 and again on 08/25/13 with possible Neulasta as an outpatient if needed   Protein-calorie malnutrition, severe Has been followed by the dietitian with recommendations for resource breeze supplements, snacks, and education to encourage small, frequent meals   Anemia of chronic disease Secondary malignancy and the sequela of prior chemotherapy. Received 1 unit of PRBCs on 08/25/2013 with a post transfusion hemoglobin of 10.1 mg/deciliter.   Acute on chronic systolic and diastolic heart failure Last 2-D echo done 07/16/13 with EF 35-40% and grade 2 diastolic dysfunction. Currently compensated. Given Lasix after her blood transfusion on 08/25/13.   Other pulmonary embolism and infarction Continue Coumadin.   Acute on chronic cough Advised to take an antihistamine and to followup with her PCP.   Thrombocytopenia, unspecified Secondary to sequela of prior chemotherapy. Platelet count stable at 55 at discharge.   UTI (urinary tract infection) Urine cultures grew in significant quantities of Klebsiella Ozaneae.   HTN (hypertension) Management bisoprolol and losartan.   Hypokalemia We'll give 40 mEq of oral potassium prior to discharge.   Thrush Continue Diflucan as needed.  Consultations:  Dr. Concha Norway, Oncology  Discharge Exam: Filed Vitals:  08/26/13 0611  BP: 128/70  Pulse: 74  Temp: 98.2 F (36.8 C)  Resp: 18   Filed Vitals:   08/25/13 1830 08/25/13 1845 08/25/13 2054  08/26/13 0611  BP: 132/88 133/90 133/81 128/70  Pulse: 86 88 90 74  Temp: 98 F (36.7 C) 98.6 F (37 C) 98.3 F (36.8 C) 98.2 F (36.8 C)  TempSrc: Oral  Oral Oral  Resp: 16 16 18 18   Height:      Weight:      SpO2:   97% 97%    Gen:  NAD Cardiovascular:  RRR, No M/R/G Respiratory: Lungs CTAB Gastrointestinal: Abdomen soft, NT/ND with normal active bowel sounds. Extremities: No C/E/C   Discharge Instructions      Discharge Orders   Future Appointments Provider Department Dept Phone   09/02/2013 9:00 AM [facility] Only [facility] [phone]   09/09/2013 8:30 AM [facility] [phone]   09/09/2013 9:00 AM [facility] Covering Provider [facility] [phone]   09/09/2013 10:30 AM [facility] [phone]   10/14/2013 10:00 AM Troy Sine, MD [facility] [phone]   Future Orders Complete By Expires   (HEART FAILURE PATIENTS) Call MD:  Anytime you have any of the following symptoms: 1) 3 pound weight gain in 24 hours or 5 pounds in 1 week 2) shortness of breath, with or without a dry hacking cough 3) swelling in the hands, feet or stomach 4) if you have to sleep on extra pillows at night in order to breathe.  As directed    Call MD for:  extreme fatigue  As directed    Call MD for:  temperature >100.4  As directed    Call MD for:  As directed    Scheduling Instructions:     Shortness of breath or worsening cough   Diet - low sodium heart healthy  As directed    Discharge instructions  As directed    Comments:     You were cared for by Dr. Jacquelynn Cree  (a hospitalist) during your hospital stay. If you have any questions about your discharge medications or the care you received while you were in the hospital after you are discharged, you can call the unit and ask to speak with the hospitalist on call  if the hospitalist that took care of you is not available. Once you are discharged, your primary care physician will handle any further medical issues. Please note that NO REFILLS for any discharge medications will be authorized once you are discharged, as it is imperative that you return to your primary care physician (or establish a relationship with a primary care physician if you do not have one) for your aftercare needs so that they can reassess your need for medications and monitor your lab values.  Any outstanding tests can be reviewed by your PCP at your follow up visit.  It is also important to review any medicine changes with your PCP.  Please bring these d/c instructions with you to your next visit so your physician can review these changes with you.  If you do not have a primary care physician, you can call [phone] for a physician referral.  It is highly recommended that you obtain a PCP for hospital follow up.   Increase activity slowly  As directed        Medication List  albuterol (5 MG/ML) 0.5% nebulizer solution  Commonly known as:  PROVENTIL  Take 0.5 mLs (2.5 mg total) by nebulization every 4 (four) hours as needed for wheezing or shortness of breath.     bisoprolol 10 MG tablet  Commonly known as:  ZEBETA  Take 1 tablet by mouth daily.     HYDROcodone-homatropine 5-1.5 MG/5ML syrup  Commonly known as:  HYCODAN  Take 5 mLs by mouth every 6 (six) hours as needed for cough.     KLOR-CON M10 10 MEQ tablet  Generic drug:  potassium chloride  Take 1 tablet by mouth daily.     levofloxacin 750 MG tablet  Commonly known as:  LEVAQUIN  Take 1 tablet (750 mg total) by mouth every other day.     lidocaine-prilocaine cream  Commonly known as:  EMLA  Apply 1 application topically as needed. Apply to port site one hour before treatment and cover with plastic wrap.     losartan 25 MG tablet  Commonly known as:  COZAAR  Take 1 tablet (25 mg total) by mouth 2 (two)  times daily.     multivitamin with minerals tablet  Take 1 tablet by mouth daily.     nystatin 100000 UNIT/ML suspension  Commonly known as:  MYCOSTATIN  Take 5 mLs (500,000 Units total) by mouth 4 (four) times daily as needed. Swish and swallow  Or  Swish and spit     omeprazole 40 MG capsule  Commonly known as:  PRILOSEC  Take 1 capsule by mouth daily.     ondansetron 4 MG tablet  Commonly known as:  ZOFRAN  Take 1 tablet (4 mg total) by mouth every 6 (six) hours as needed for nausea.     oseltamivir 75 MG capsule  Commonly known as:  TAMIFLU  Take 1 capsule (75 mg total) by mouth daily.     valACYclovir 500 MG tablet  Commonly known as:  VALTREX  Take 1 tablet (500 mg total) by mouth daily.     warfarin 2 MG tablet  Commonly known as:  COUMADIN  Take 2 mg by mouth daily.          The results of significant diagnostics from this hospitalization (including imaging, microbiology, ancillary and laboratory) are listed below for reference.    Significant Diagnostic Studies:  Dg Chest 2 View 08/21/2013  IMPRESSION: New/ increased opacity in the right middle lobe as well as left and possibly right lower lobes. Findings may reflect multifocal pneumonia and/or aspiration.   Electronically Signed   By: Logan Bores   On: 08/21/2013 15:19   Labs:  Basic Metabolic Panel:  Recent Labs Lab 08/21/13 1535 08/22/13 0405 08/23/13 1633 08/26/13 0945  NA 140 138 140 139  K 3.7 3.5* 3.6* 3.1*  CL 104 104 104 101  CO2 24 25 24 27   GLUCOSE 98 83 97 120*  BUN 10 12 11 8   CREATININE 0.94 1.05 0.84 0.81  CALCIUM 8.4 7.6* 7.9* 8.5   GFR Estimated Creatinine Clearance: 49.7 ml/min (by C-G formula based on Cr of 0.81). Liver Function Tests:  Recent Labs Lab 08/21/13 1535  AST 20  ALT 8  ALKPHOS 59  BILITOT 0.6  PROT 5.3*  ALBUMIN 3.0*   Coagulation profile  Recent Labs Lab 08/22/13 0405 08/23/13 0645 08/24/13 0500 08/25/13 0630 08/26/13 0540  INR 1.80* 1.51*  1.58* 1.84* 1.98*    CBC:  Recent Labs Lab 08/21/13 1535 08/22/13 0405 08/23/13 0645 08/24/13 1240 08/25/13 0500 08/26/13  0945  WBC 6.9 4.7 14.6* 7.8 3.5* 9.9  NEUTROABS 6.3  --  14.0* 7.2  --   --   HGB 10.1* 8.4* 8.0* 8.3* 7.6* 10.1*  HCT 30.5* 25.6* 23.9* 24.8* 22.6* 29.7*  MCV 93.0 93.8 94.1 92.9 92.2 90.0  PLT 84* 60* 53* 64* 50* 55*   Cardiac Enzymes: No results found for this basename: CKTOTAL, CKMB, CKMBINDEX, TROPONINI,  in the last 168 hours BNP: No components found with this basename: POCBNP,  CBG: No results found for this basename: GLUCAP,  in the last 168 hours D-Dimer No results found for this basename: DDIMER,  in the last 72 hours Hgb A1c No results found for this basename: HGBA1C,  in the last 72 hours Lipid Profile No results found for this basename: CHOL, HDL, LDLCALC, TRIG, CHOLHDL, LDLDIRECT,  in the last 72 hours Thyroid function studies No results found for this basename: TSH, T4TOTAL, FREET3, T3FREE, THYROIDAB,  in the last 72 hours Anemia work up No results found for this basename: VITAMINB12, FOLATE, FERRITIN, TIBC, IRON, RETICCTPCT,  in the last 72 hours Microbiology Recent Results (from the past 240 hour(s))  CULTURE, BLOOD (ROUTINE X 2)     Status: None   Collection Time    08/21/13  2:46 PM      Result Value Range Status   Specimen Description BLOOD LEFT ANTECUBITAL   Final   Special Requests BOTTLES DRAWN AEROBIC AND ANAEROBIC 5CC   Final   Culture  Setup Time     Final   Value: 08/21/2013 20:24     Performed at [facility]   Culture     Final   Value: STAPHYLOCOCCUS SPECIES (COAGULASE NEGATIVE)     Note: THE SIGNIFICANCE OF ISOLATING THIS ORGANISM FROM A SINGLE SET OF BLOOD CULTURES WHEN MULTIPLE SETS ARE DRAWN IS UNCERTAIN. PLEASE NOTIFY THE MICROBIOLOGY DEPARTMENT WITHIN ONE WEEK IF SPECIATION AND SENSITIVITIES ARE REQUIRED.     Note: Gram Stain Report Called to,Read Back By and Verified With: [facility] ANDERSON 08/22/2013  2135PM MITCV     Performed at [facility]   Report Status 08/24/2013 FINAL   Final  URINE CULTURE     Status: None   Collection Time    08/21/13  3:16 PM      Result Value Range Status   Specimen Description URINE, CLEAN CATCH   Final   Special Requests A   Final   Culture  Setup Time     Final   Value: 08/21/2013 21:56     Performed at [facility]     Final   Value: 10,000 COLONIES/ML     Performed at [facility]   Culture     Final   Value: KLEBSIELLA OZAENAE     Performed at [facility]   Report Status 08/23/2013 FINAL   Final   Organism ID, Bacteria KLEBSIELLA OZAENAE   Final  CULTURE, BLOOD (ROUTINE X 2)     Status: None   Collection Time    08/21/13  3:35 PM      Result Value Range Status   Specimen Description BLOOD RIGHT ANTECUBITAL   Final   Special Requests BOTTLES DRAWN AEROBIC AND ANAEROBIC 5CC   Final   Culture  Setup Time     Final   Value: 08/21/2013 20:25     Performed at [facility]   Culture     Final   Value:  BLOOD CULTURE RECEIVED NO GROWTH TO DATE CULTURE WILL BE HELD FOR 5 DAYS BEFORE ISSUING A FINAL NEGATIVE REPORT     Performed at [facility]   Report Status PENDING   Incomplete  CULTURE, EXPECTORATED SPUTUM-ASSESSMENT     Status: None   Collection Time    08/22/13  6:10 PM      Result Value Range Status   Specimen Description SPUTUM   Final   Special Requests NONE   Final   Sputum evaluation     Final   Value: THIS SPECIMEN IS ACCEPTABLE. RESPIRATORY CULTURE REPORT TO FOLLOW.   Report Status 08/22/2013 FINAL   Final  CULTURE, RESPIRATORY (NON-EXPECTORATED)     Status: None   Collection Time    08/22/13  6:10 PM      Result Value Range Status   Specimen Description SPUTUM   Final   Special Requests NONE   Final   Gram Stain     Final   Value: ABUNDANT WBC PRESENT, PREDOMINANTLY PMN     RARE SQUAMOUS EPITHELIAL CELLS PRESENT     RARE GRAM POSITIVE COCCI IN PAIRS      Performed at [facility]   Culture     Final   Value: NORMAL OROPHARYNGEAL FLORA     Performed at [facility]   Report Status 08/25/2013 FINAL   Final    Time coordinating discharge: 35 minutes.  Signed:  RAMA,CHRISTINA  Pager [phone] [facility] 08/26/2013, 1:10 PM

## 2013-08-26 NOTE — Care Management Note (Signed)
Cm spoke with patient at bedside with spouse present concerning discharge planning. No barriers to dc or HH needs assessed at this time. Per pt up independently in room. Cm to sign off.    Venita Lick Dessie Tatem,MSN,RN [phone]

## 2013-08-26 NOTE — Progress Notes (Signed)
ANTIBIOTIC CONSULT NOTE - FOLLOW UP  Pharmacy Consult for Vancomycin and Cefepime --> levofloxacin Indication: HCAP, UTI, GPC bacteremia  Allergies  Allergen Reactions  . Penicillins Rash    Patient Measurements: Height: 5\' 2"  (157.5 cm) Weight: 128 lb (58.06 kg) IBW/kg (Calculated) : 50.1  Vital Signs: Temp: 98.2 F (36.8 C) (01/28 7858) Temp src: Oral (01/28 0611) BP: 128/70 mmHg (01/28 0611) Pulse Rate: 74 (01/28 0611) Intake/Output from previous day: 01/27 0701 - 01/28 0700 In: 882.5 [P.O.:120; Blood:262.5; IV Piggyback:500] Out: 8502 [Urine:1650] Intake/Output from this shift: Total I/O In: -  Out: 1000 [Urine:1000]  Labs:  Recent Labs  08/23/13 1633 08/24/13 1240 08/25/13 0500  WBC  --  7.8 3.5*  HGB  --  8.3* 7.6*  PLT  --  64* 50*  CREATININE 0.84  --   --    Estimated Creatinine Clearance: 47.9 ml/min (by C-G formula based on Cr of 0.84).  Recent Labs  08/23/13 1633  VANCOTROUGH 13.1     Microbiology: 1/23 blood x 2: 1/2 CoNS, F 1/23 legionella/strep pneumo >> neg 1/23 sputum >> nml oropharygeal flora 1/23 respiratory virus ag >> ordered-never collected 1/23 influenza panel >> + Influenza A  1/23 urine >> klebsiella ozaenae (R- ampicillin, I - nitrofurantoin, S - cephalosporins, LVQ, Gent, Zosyn, Tobra, Bactrim)  Anti-infectives: 1/23 >> Vanco >> 1/28 1/23 >> Cefepime >> 1/28 1/24 >> Valacyclovir (from PTA for viral ppx) >>  1/25 >> Tamiflu x5d >> 1/28 >> levofloxacin >>  Assessment: 73 yo female with h/o non Hodgkin's lymphoma s/p chemotherapy admitted on 1/23 with cough and shortness of breath.  Day #6 vancomycin and cefepime for HCAP, Klebsiella UTI and bacteremia  Vancomycin trough slightly < goal on 500mg  q12h, but expect accumulation  SCr stable at 0.81, CrCl 49 ml/min  Noted Neupogen restarted on 1/27, will now be discontinued as ANC>7k  Remains afebrile  Per [facility] MD, will transition patient to appropriate renally dose  levofloxacin for duration of antibiotic therapy   Goal of Therapy:  Vancomycin trough level 15-20 mcg/ml Cefepime dose per renal functon  Plan:   Discontinue vancomcyin  Discontinue cefepime Start levofloxacin 750mg  PO q48h to complete 10-14 days of total antibiotic therapy   Thank you for the consult.  Johny Drilling, PharmD, BCPS Pager: [phone] Pharmacy: [phone] 08/26/2013 11:00 AM

## 2013-08-27 ENCOUNTER — Telehealth: Payer: Self-pay | Admitting: Internal Medicine

## 2013-08-27 ENCOUNTER — Ambulatory Visit (HOSPITAL_BASED_OUTPATIENT_CLINIC_OR_DEPARTMENT_OTHER): Payer: Medicare Other

## 2013-08-27 VITALS — BP 121/81 | HR 91 | Temp 97.8°F

## 2013-08-27 DIAGNOSIS — C8589 Other specified types of non-Hodgkin lymphoma, extranodal and solid organ sites: Secondary | ICD-10-CM

## 2013-08-27 DIAGNOSIS — C8590 Non-Hodgkin lymphoma, unspecified, unspecified site: Secondary | ICD-10-CM

## 2013-08-27 LAB — CULTURE, BLOOD (ROUTINE X 2): Culture: NO GROWTH

## 2013-08-27 MED ORDER — PEGFILGRASTIM INJECTION 6 MG/0.6ML
6.0000 mg | Freq: Once | SUBCUTANEOUS | Status: AC
  Administered 2013-08-27: 6 mg via SUBCUTANEOUS
  Filled 2013-08-27: qty 0.6

## 2013-08-27 NOTE — Telephone Encounter (Signed)
Talked to pt and gave her new appt for 2/13, ema iled Sharyn Lull regarding new time for lab,MD

## 2013-08-28 ENCOUNTER — Ambulatory Visit: Payer: Medicare Other

## 2013-08-28 ENCOUNTER — Encounter: Payer: Self-pay | Admitting: Internal Medicine

## 2013-08-28 ENCOUNTER — Telehealth: Payer: Self-pay | Admitting: *Deleted

## 2013-08-28 NOTE — Telephone Encounter (Signed)
Per staff message and POF I have scheduled appts.  JMW  

## 2013-08-31 ENCOUNTER — Ambulatory Visit: Payer: Medicare Other

## 2013-08-31 ENCOUNTER — Telehealth: Payer: Self-pay | Admitting: Internal Medicine

## 2013-08-31 ENCOUNTER — Other Ambulatory Visit (HOSPITAL_BASED_OUTPATIENT_CLINIC_OR_DEPARTMENT_OTHER): Payer: Medicare Other

## 2013-08-31 ENCOUNTER — Ambulatory Visit (HOSPITAL_BASED_OUTPATIENT_CLINIC_OR_DEPARTMENT_OTHER): Payer: Medicare Other | Admitting: Pharmacist

## 2013-08-31 ENCOUNTER — Other Ambulatory Visit: Payer: Medicare Other

## 2013-08-31 DIAGNOSIS — I2699 Other pulmonary embolism without acute cor pulmonale: Secondary | ICD-10-CM

## 2013-08-31 DIAGNOSIS — D696 Thrombocytopenia, unspecified: Secondary | ICD-10-CM

## 2013-08-31 DIAGNOSIS — C8590 Non-Hodgkin lymphoma, unspecified, unspecified site: Secondary | ICD-10-CM

## 2013-08-31 LAB — CBC WITH DIFFERENTIAL/PLATELET
BASO%: 0.5 % (ref 0.0–2.0)
Basophils Absolute: 0 10*3/uL (ref 0.0–0.1)
EOS%: 0.4 % (ref 0.0–7.0)
Eosinophils Absolute: 0 10*3/uL (ref 0.0–0.5)
HEMATOCRIT: 28.9 % — AB (ref 34.8–46.6)
HGB: 9.9 g/dL — ABNORMAL LOW (ref 11.6–15.9)
LYMPH%: 6.7 % — AB (ref 14.0–49.7)
MCH: 31.8 pg (ref 25.1–34.0)
MCHC: 34.1 g/dL (ref 31.5–36.0)
MCV: 93.3 fL (ref 79.5–101.0)
MONO#: 0.8 10*3/uL (ref 0.1–0.9)
MONO%: 9.4 % (ref 0.0–14.0)
NEUT#: 7.5 10*3/uL — ABNORMAL HIGH (ref 1.5–6.5)
NEUT%: 83 % — AB (ref 38.4–76.8)
Platelets: 57 10*3/uL — ABNORMAL LOW (ref 145–400)
RBC: 3.1 10*6/uL — ABNORMAL LOW (ref 3.70–5.45)
RDW: 17.4 % — ABNORMAL HIGH (ref 11.2–14.5)
WBC: 9 10*3/uL (ref 3.9–10.3)
lymph#: 0.6 10*3/uL — ABNORMAL LOW (ref 0.9–3.3)

## 2013-08-31 LAB — BASIC METABOLIC PANEL (CC13)
Anion Gap: 11 mEq/L (ref 3–11)
BUN: 8.3 mg/dL (ref 7.0–26.0)
CALCIUM: 9.3 mg/dL (ref 8.4–10.4)
CHLORIDE: 107 meq/L (ref 98–109)
CO2: 25 mEq/L (ref 22–29)
CREATININE: 0.9 mg/dL (ref 0.6–1.1)
Glucose: 99 mg/dl (ref 70–140)
Potassium: 3.6 mEq/L (ref 3.5–5.1)
Sodium: 143 mEq/L (ref 136–145)

## 2013-08-31 LAB — PROTIME-INR
INR: 1.7 — ABNORMAL LOW (ref 2.00–3.50)
Protime: 20.4 Seconds — ABNORMAL HIGH (ref 10.6–13.4)

## 2013-08-31 LAB — POCT INR: INR: 1.7

## 2013-08-31 NOTE — Telephone Encounter (Signed)
Called pt and lerft message regardin appt for February 2015 lab,md and chemo

## 2013-08-31 NOTE — Telephone Encounter (Signed)
Mailed appt to patient for February 2015

## 2013-08-31 NOTE — Patient Instructions (Signed)
INR just below goal Take 2 tablets tonight (4mg  total)  then continue Coumadin 2mg  daily.   Recheck INR on 09/11/13 at 8 am for lab, 8:30 am for MD visit with Dr. Juliann Mule and Treatment @ 9:30am we will see you in infusion

## 2013-08-31 NOTE — Progress Notes (Signed)
INR just below goal Pt seen in coumadin clinic with husband Pt recently released from hospital last week and has completed course of Tamiflu and Levaquin Pt is doing well with no complaints She states she is feeling better and eating better No issues regarding unusual bleeding or bruising No missed or extra doses Plan: Take 2 tablets tonight (4mg  total)  then continue Coumadin 2mg  daily.   Recheck INR on 09/11/13 at 8 am for lab, 8:30 am for MD visit with Dr. Juliann Mule and Treatment @ 9:30am we will see you in infusion If INR remains below goal at next visit will consider slight dose increase

## 2013-09-02 ENCOUNTER — Other Ambulatory Visit: Payer: Medicare Other

## 2013-09-04 ENCOUNTER — Other Ambulatory Visit (HOSPITAL_BASED_OUTPATIENT_CLINIC_OR_DEPARTMENT_OTHER): Payer: Medicare Other

## 2013-09-04 DIAGNOSIS — C8589 Other specified types of non-Hodgkin lymphoma, extranodal and solid organ sites: Secondary | ICD-10-CM

## 2013-09-04 DIAGNOSIS — C8590 Non-Hodgkin lymphoma, unspecified, unspecified site: Secondary | ICD-10-CM

## 2013-09-04 LAB — CBC WITH DIFFERENTIAL/PLATELET
BASO%: 0.4 % (ref 0.0–2.0)
Basophils Absolute: 0 10*3/uL (ref 0.0–0.1)
EOS%: 0.6 % (ref 0.0–7.0)
Eosinophils Absolute: 0.1 10*3/uL (ref 0.0–0.5)
HCT: 27.3 % — ABNORMAL LOW (ref 34.8–46.6)
HGB: 9 g/dL — ABNORMAL LOW (ref 11.6–15.9)
LYMPH#: 1 10*3/uL (ref 0.9–3.3)
LYMPH%: 10.1 % — ABNORMAL LOW (ref 14.0–49.7)
MCH: 30.8 pg (ref 25.1–34.0)
MCHC: 33 g/dL (ref 31.5–36.0)
MCV: 93.5 fL (ref 79.5–101.0)
MONO#: 1 10*3/uL — AB (ref 0.1–0.9)
MONO%: 9.7 % (ref 0.0–14.0)
NEUT#: 7.7 10*3/uL — ABNORMAL HIGH (ref 1.5–6.5)
NEUT%: 79.2 % — ABNORMAL HIGH (ref 38.4–76.8)
Platelets: 48 10*3/uL — ABNORMAL LOW (ref 145–400)
RBC: 2.92 10*6/uL — AB (ref 3.70–5.45)
RDW: 17.6 % — ABNORMAL HIGH (ref 11.2–14.5)
WBC: 9.8 10*3/uL (ref 3.9–10.3)

## 2013-09-04 LAB — BASIC METABOLIC PANEL (CC13)
Anion Gap: 9 mEq/L (ref 3–11)
BUN: 10 mg/dL (ref 7.0–26.0)
CALCIUM: 8.7 mg/dL (ref 8.4–10.4)
CHLORIDE: 108 meq/L (ref 98–109)
CO2: 26 meq/L (ref 22–29)
Creatinine: 0.9 mg/dL (ref 0.6–1.1)
Glucose: 101 mg/dl (ref 70–140)
Potassium: 3.9 mEq/L (ref 3.5–5.1)
Sodium: 143 mEq/L (ref 136–145)

## 2013-09-07 ENCOUNTER — Encounter: Payer: Self-pay | Admitting: Internal Medicine

## 2013-09-09 ENCOUNTER — Ambulatory Visit: Payer: Medicare Other

## 2013-09-09 ENCOUNTER — Other Ambulatory Visit: Payer: Medicare Other

## 2013-09-09 ENCOUNTER — Other Ambulatory Visit: Payer: Self-pay | Admitting: Family Medicine

## 2013-09-11 ENCOUNTER — Ambulatory Visit: Payer: Medicare Other | Admitting: Pharmacist

## 2013-09-11 ENCOUNTER — Ambulatory Visit: Payer: Medicare Other

## 2013-09-11 ENCOUNTER — Telehealth: Payer: Self-pay | Admitting: Internal Medicine

## 2013-09-11 ENCOUNTER — Other Ambulatory Visit: Payer: Medicare Other

## 2013-09-11 ENCOUNTER — Ambulatory Visit (HOSPITAL_BASED_OUTPATIENT_CLINIC_OR_DEPARTMENT_OTHER): Payer: Medicare Other | Admitting: Internal Medicine

## 2013-09-11 ENCOUNTER — Other Ambulatory Visit (HOSPITAL_BASED_OUTPATIENT_CLINIC_OR_DEPARTMENT_OTHER): Payer: Medicare Other

## 2013-09-11 VITALS — BP 147/73 | HR 80 | Temp 97.4°F | Resp 18 | Ht 62.0 in | Wt 122.8 lb

## 2013-09-11 DIAGNOSIS — D6959 Other secondary thrombocytopenia: Secondary | ICD-10-CM | POA: Diagnosis not present

## 2013-09-11 DIAGNOSIS — C8589 Other specified types of non-Hodgkin lymphoma, extranodal and solid organ sites: Secondary | ICD-10-CM | POA: Diagnosis not present

## 2013-09-11 DIAGNOSIS — T451X5A Adverse effect of antineoplastic and immunosuppressive drugs, initial encounter: Secondary | ICD-10-CM

## 2013-09-11 DIAGNOSIS — I2699 Other pulmonary embolism without acute cor pulmonale: Secondary | ICD-10-CM | POA: Diagnosis not present

## 2013-09-11 DIAGNOSIS — C8590 Non-Hodgkin lymphoma, unspecified, unspecified site: Secondary | ICD-10-CM

## 2013-09-11 DIAGNOSIS — I509 Heart failure, unspecified: Secondary | ICD-10-CM

## 2013-09-11 LAB — PROTIME-INR
INR: 1.4 — AB (ref 2.00–3.50)
Protime: 16.8 Seconds — ABNORMAL HIGH (ref 10.6–13.4)

## 2013-09-11 LAB — CBC WITH DIFFERENTIAL/PLATELET
BASO%: 0.5 % (ref 0.0–2.0)
Basophils Absolute: 0 10*3/uL (ref 0.0–0.1)
EOS%: 0.6 % (ref 0.0–7.0)
Eosinophils Absolute: 0 10*3/uL (ref 0.0–0.5)
HEMATOCRIT: 26.8 % — AB (ref 34.8–46.6)
HGB: 9 g/dL — ABNORMAL LOW (ref 11.6–15.9)
LYMPH#: 1 10*3/uL (ref 0.9–3.3)
LYMPH%: 18 % (ref 14.0–49.7)
MCH: 31.9 pg (ref 25.1–34.0)
MCHC: 33.6 g/dL (ref 31.5–36.0)
MCV: 95.1 fL (ref 79.5–101.0)
MONO#: 0.5 10*3/uL (ref 0.1–0.9)
MONO%: 9.2 % (ref 0.0–14.0)
NEUT#: 4.2 10*3/uL (ref 1.5–6.5)
NEUT%: 71.7 % (ref 38.4–76.8)
Platelets: 60 10*3/uL — ABNORMAL LOW (ref 145–400)
RBC: 2.81 10*6/uL — ABNORMAL LOW (ref 3.70–5.45)
RDW: 19.1 % — ABNORMAL HIGH (ref 11.2–14.5)
WBC: 5.8 10*3/uL (ref 3.9–10.3)

## 2013-09-11 LAB — COMPREHENSIVE METABOLIC PANEL (CC13)
ALT: 12 U/L (ref 0–55)
AST: 26 U/L (ref 5–34)
Albumin: 3.3 g/dL — ABNORMAL LOW (ref 3.5–5.0)
Alkaline Phosphatase: 77 U/L (ref 40–150)
Anion Gap: 12 mEq/L — ABNORMAL HIGH (ref 3–11)
BUN: 7.2 mg/dL (ref 7.0–26.0)
CALCIUM: 8.2 mg/dL — AB (ref 8.4–10.4)
CHLORIDE: 108 meq/L (ref 98–109)
CO2: 25 mEq/L (ref 22–29)
CREATININE: 0.8 mg/dL (ref 0.6–1.1)
Glucose: 88 mg/dl (ref 70–140)
Potassium: 3.2 mEq/L — ABNORMAL LOW (ref 3.5–5.1)
Sodium: 144 mEq/L (ref 136–145)
Total Bilirubin: 0.65 mg/dL (ref 0.20–1.20)
Total Protein: 5 g/dL — ABNORMAL LOW (ref 6.4–8.3)

## 2013-09-11 LAB — POCT INR: INR: 1.4

## 2013-09-11 LAB — LACTATE DEHYDROGENASE (CC13): LDH: 327 U/L — ABNORMAL HIGH (ref 125–245)

## 2013-09-11 NOTE — Patient Instructions (Signed)
1. Patient will obtain labs in one week.   Plts are 60,000.   They need to be at least 100,000 to proceed with chemotherapy.  2. Follow in clinic in 3 weeks.

## 2013-09-11 NOTE — Patient Instructions (Signed)
INR below goal Starting today Increase Coumadin to 2mg  daily except for 4 mg (2 tablets) on Mondays and Fridays.   Recheck INR on 09/18/13 at 8 am for lab, 8:30 am for possible treatment  we will see you in infusion at 8:45am

## 2013-09-11 NOTE — Progress Notes (Addendum)
INR below goal (1.4) No treatment today due to patient's low platelet count (60k) Patient is doing well today but is just frustrated that her platelet count has not recovered Pt reports no unusual bleeding or bruising No missed or extra doses of coumadin No medication changes (she has been off of antibiotics for a few weeks now) Her diet and appetite has been normal and good. Discussed with Dr. Juliann Mule, no lovenox needed at this time due to platelet count. Will only increase coumadin dose I will increase her dose cautiously due to the low platelets knowing that Ms. Fifer may continue to need slight dose increases if INR remains below goal Plan: Starting today Increase Coumadin to 2mg  daily except for 4 mg (2 tablets) on Mondays and Fridays.   Recheck INR on 09/18/13 at 8 am for lab, 8:30 am for possible treatment  we will see you in infusion at 8:45am  Samples provided (pt has rx for 5 mg tablets if patient requires lower dose may need to change rx coumadin strength):  Coumadin 2 mg tablets x 10 LOT: [id number] Exp: 04/2014

## 2013-09-11 NOTE — Progress Notes (Signed)
[facility] OFFICE PROGRESS NOTE  Tonya Haste, MD [place] [address]  DIAGNOSIS: Non-Hodgkins lymphoma - Plan: CBC with Differential  Other pulmonary embolism and infarction  Chief Complaint  Patient presents with  . Lymphoma      Non-Hodgkins lymphoma   03/27/2013 - 03/31/2013 Hospital Admission Admitted to [facility] for profound anemia (Hgb 6.4).  C/o fevers, drenching nightsweats, weight lost.  Negative colonoscopy and EGD.  CT of C/A/P demonstrated significant lymphadenopathy suspcious of ympha.     03/31/2013 Initial Diagnosis Biopsy of R inguinal lymph node revealed Non-Hodgkins lymphoma. Diffuse Large B-cell Lymphoma   04/10/2013 - 04/24/2013 Hospital Admission Hypercalcemia, renal failure and dehydration.  Started min-RCHOP.    04/15/2013 Bone Marrow Biopsy Involvement with Diffuse Large B-cell Lymphoma. Stage IV NHL w B-symptoms.    04/18/2013 - 04/18/2013 Chemotherapy Mini R-CHOP Cycle #1 on 04/18/2013. Dose reduced to poor nutrion. It consisted on Cytoxan 400 mg/m2,Doxorubicin 25 mg/m2, Rituximab 375 mg/m2, Vincristine 11m.  Prednisone 100 mg daily for 9/13 - 9/16.   Received neupogen on 9/21, 9/27 -9/30.     04/25/2013 - 04/29/2013 Hospital Admission Admitted due to worsening mouth sores/oral thrush, odynophagia, failure to thrive.  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  [facility] CG[facility]eth Munson and discharged to home on 05/19/2013.     06/01/2013 - 06/01/2013 Chemotherapy Mini R-CHO #2 dosed as above.  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  Received neulasta shot on 06/02/13   06/16/2013 Cancer Staging Re-staging PET/CT demonstrated significant interval reduction in axillary lymphadenopathy.  There was 1 small residual right axillary lymph node measuring 16 x 12 mm which showed FDG uptake.  Signifcant reduction in mediastinal and hilar  lymphadenopathy.    06/16/2013 Imaging CT of chest noted left-sided pulmonary embolim. Started on lovenox to coumadin bridge.     06/22/2013 - 06/22/2013 Chemotherapy Mini-R-CHO #3.  Received Neulasta on 06/23/2013.     07/16/2013 Imaging 2-D Echocardiogram demonstrates 35-45% EF with Grade 2 diastolic dysfunction.  Doxorubicin discontinued with consultation by cardiology (Dr. KClaiborne Billings.     08/19/2013 - 08/21/2013 Chemotherapy Mini-R-CEO #4.  Ectoposide 25 mg/m2 instead of doxo due to above.  Ectoposide 53mbid on day 2/3.  On day 3 (08/21/2013) she received 1 of two oral doses of ectoposide.    08/21/2013 - 08/26/2013 [facility] and had influenzae A and pneumonia. Completed tamiflu and oral antibiotics.    INTERVAL HISTORY: Tonya [age]ORPE89.o. female with the above is here for follow-up.  She was discharged on 01/28 following treatment for influenza A complicated by pneumonia.  She is anxious to proceed with her cycle #5 of mini-R-CEO.  She denies any fevers.  She reports that her appetite has improved.  She complains of mild tingling of her tongue making it hard to tolerate some foods.  She states that she does not want any medicines which will make it hard for her coumadin management.  She presents along with her husband [name]. She reports improvement in her cough.   MEDICAL HISTORY: Past Medical History  Diagnosis Date  . Hypertension   . Renal insufficiency   . Osteopenia   . Allergy   . Heart murmur   . GERD (gastroesophageal reflux disease)   . Anemia   . Hypercalcemia 03/27/2013  . Non-Hodgkins lymphoma 04/06/2013  . Diastolic dysfunction, grade I 04/12/2013  INTERIM HISTORY: has Hypercalcemia; Non-Hodgkins lymphoma; Elevated LFTs; Protein-calorie malnutrition, severe; Anemia of chronic disease; Acute on chronic combined systolic and diastolic heart failure; Other pulmonary embolism and infarction; Acute on chronic cough;  Dysphagia, unspecified(787.20); Cardiomyopathy; Diastolic dysfunction grade 2; PNA (pneumonia); Influenza with pneumonia; Thrombocytopenia, unspecified; Acute respiratory failure with hypoxia secondary to influenza A with pneumonia; UTI (urinary tract infection); History of pulmonary embolism; HTN (hypertension); Hypokalemia; and Thrush on her problem list.    ALLERGIES:  is allergic to penicillins.  MEDICATIONS: has a current medication list which includes the following prescription(s): albuterol, bisoprolol, lidocaine-prilocaine, losartan, mirtazapine, multivitamin with minerals, nystatin, omeprazole, ondansetron, valacyclovir, warfarin, and hydrocodone-homatropine.  SURGICAL HISTORY:  Past Surgical History  Procedure Laterality Date  . Appendectomy    . Colonoscopy  2008  . Spine surgery    . Back surgery      LOWER BACK TWICE  . Abdominal hysterectomy    . Esophagogastroduodenoscopy N/A 03/29/2013    Procedure: ESOPHAGOGASTRODUODENOSCOPY (EGD);  Surgeon: Juanita Craver, MD;  Location: [facility] ENDOSCOPY;  Service: Endoscopy;  Laterality: N/A;  . Inguinal lymph node biopsy Right 03/31/2013    Procedure: INGUINAL LYMPH NODE BIOPSY;  Surgeon: Gwenyth Ober, MD;  Location: [facility];  Service: General;  Laterality: Right;    REVIEW OF SYSTEMS:   Constitutional: Denies fevers, chills; She continues to lose weight and his decreased in her taste.  Eyes: Denies blurriness of vision Ears, nose, mouth, throat, and face: Reports mild mucositis but denies a sore throat Respiratory: Denies cough, dyspnea or wheezes Cardiovascular: Denies palpitation, chest discomfort or lower extremity swelling Gastrointestinal:  Denies nausea, heartburn or change in bowel habits Skin: Denies abnormal skin rashes Lymphatics: Denies new lymphadenopathy or easy bruising Neurological:Denies numbness, tingling or new weaknesses Behavioral/Psych: Mood is stable, no new changes  All other systems were reviewed with the patient and  are negative.  PHYSICAL EXAMINATION: ECOG PERFORMANCE STATUS: 0 - Asymptomatic  Blood pressure 147/73, pulse 80, temperature 97.4 F (36.3 C), temperature source Oral, resp. rate 18, height '5\' 2"'  (1.575 m), weight 122 lb 12.8 oz (55.702 kg), SpO2 99.00%.  GENERAL:alert, no distress and comfortable; easily ambulatory.  SKIN: skin color, texture, turgor are normal, no rashes or significant lesions; she does have a raised lesion on her left arm.  EYES: normal, Conjunctiva are pink and non-injected, sclera clear OROPHARYNX:no exudate, no erythema and lips, buccal mucosa, and tongue with smooth surface suggestive of recovering mucositis.    NECK: supple, thyroid normal size, non-tender, without nodularity LYMPH:  no palpable lymphadenopathy in the cervical, axillary or supraclavicular LUNGS: clear to auscultation and percussion with normal breathing effort HEART: regular rate & rhythm and no murmurs and no lower extremity edema ABDOMEN:abdomen soft, non-tender and normal bowel sounds Musculoskeletal:no cyanosis of digits and no clubbing  NEURO: alert & oriented x 3 with fluent speech, no focal motor/sensory deficits  Labs:  Lab Results  Component Value Date   WBC 5.8 09/11/2013   HGB 9.0* 09/11/2013   HCT 26.8* 09/11/2013   MCV 95.1 09/11/2013   PLT 60* 09/11/2013   NEUTROABS 4.2 09/11/2013      Chemistry      Component Value Date/Time   NA 144 09/11/2013 0809   NA 139 08/26/2013 0945   K 3.2* 09/11/2013 0809   K 3.1* 08/26/2013 0945   CL 101 08/26/2013 0945   CO2 25 09/11/2013 0809   CO2 27 08/26/2013 0945   BUN 7.2 09/11/2013 0809   BUN 8 08/26/2013 0945  CREATININE 0.8 09/11/2013 0809   CREATININE 0.81 08/26/2013 0945   CREATININE 0.85 07/17/2013 0834      Component Value Date/Time   CALCIUM 8.2* 09/11/2013 0809   CALCIUM 8.5 08/26/2013 0945   ALKPHOS 77 09/11/2013 0809   ALKPHOS 59 08/21/2013 1535   AST 26 09/11/2013 0809   AST 20 08/21/2013 1535   ALT 12 09/11/2013 0809   ALT 8  08/21/2013 1535   BILITOT 0.65 09/11/2013 0809   BILITOT 0.6 08/21/2013 1535      Basic Metabolic Panel:  Recent Labs Lab 09/11/13 0809  NA 144  K 3.2*  CO2 25  GLUCOSE 88  BUN 7.2  CREATININE 0.8  CALCIUM 8.2*   GFR Estimated Creatinine Clearance: 50.3 ml/min (by C-G formula based on Cr of 0.8). Liver Function Tests:  Recent Labs Lab 09/11/13 0809  AST 26  ALT 12  ALKPHOS 77  BILITOT 0.65  PROT 5.0*  ALBUMIN 3.3*   Coagulation profile  Recent Labs Lab 09/11/13 0803 09/11/13 0900  INR 1.40* 1.4  PROTIME 16.8*  --     CBC:  Recent Labs Lab 09/11/13 0803  WBC 5.8  NEUTROABS 4.2  HGB 9.0*  HCT 26.8*  MCV 95.1  PLT 60*   Studies:  No results found.   RADIOGRAPHIC STUDIES: Dg Chest 2 View  08/21/2013   CLINICAL DATA:  Pain.  Shortness of breath.  EXAM: CHEST  2 VIEW  COMPARISON:  PET-CT 06/16/2013.  Chest radiograph 04/25/2013.  FINDINGS: Right-sided Port-A-Cath remains in place with tip overlying the lower SVC, unchanged. Cardiomediastinal silhouette is within normal limits. There are new/ increased patchy opacities within the right middle lobe. There is also a new opacity in the left and possibly right lower lobes. No pleural effusion is identified. There is no pneumothorax. Posterior lumbar fixation hardware is partially visualized.  IMPRESSION: New/ increased opacity in the right middle lobe as well as left and possibly right lower lobes. Findings may reflect multifocal pneumonia and/or aspiration.   Electronically Signed   By: Logan Bores   On: 08/21/2013 15:19    ASSESSMENT: Eber Hong 73 y.o. female with a history of Non-Hodgkins lymphoma - Plan: CBC with Differential  Other pulmonary embolism and infarction   PLAN:  1. 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 -- She was scheduled for min- R-CEO today.  Her plts are 60K and we will allow an additional week of recovery.  Both the patient and her family were frustrated with this delay.   However, given her bleeding risk is high (she is currently being anticoagulated on coumadin), I felt this was the safest way to proceed.   We will repeat her counts next Friday and proceed accordingly.  --Prednisone was held due to psychosis, confusion and at patient's request.   2. 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 --INR is 1.4.  She is being followed by anticoagulation clinic.  Treatment duration is 6 months.  PE likely secondary to immobilization during recent hospitalizations as noted above.  Continue coumadin and we will plan to hold if Plts less than 50K.    3. History of HCAP, staph coag negative likely contaminant, + Influenzae A.  --Oxygenation and cough improved.   4. Diastolic/systolic CHF (EF 25-85%).  --Doxorubicin was discontinued due to worsening congestive heart failure. She is evolemic .   5. Thrombocytopenia secondary to chemotherapy.  --Already dose reduced.  We will allow additional count recovery time.   6. 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  --Continue valcyte prophylaxis  and diflucan prn  7. Follow-up.  --Patient will have labs checked on 02/20 and proceed with R-CEO cycle #5.  They had concerns about the cost of the etoposide (oral).  I discussed changing to intravenous administration, but they declined.  F/u in 3-4 weeks.   All questions were answered. The patient knows to call the clinic with any problems, questions or concerns. We can certainly see the patient much sooner if necessary.  I spent 15 minutes counseling the patient face to face. The total time spent in the appointment was 25 minutes.    Evann Koelzer, MD 09/13/2013 11:42 AM

## 2013-09-11 NOTE — Telephone Encounter (Signed)
gv and printed appt sched and avs forpt for Feb adn March....sed added tx.    °

## 2013-09-15 ENCOUNTER — Encounter: Payer: Self-pay | Admitting: Internal Medicine

## 2013-09-15 ENCOUNTER — Telehealth: Payer: Self-pay | Admitting: *Deleted

## 2013-09-15 NOTE — Telephone Encounter (Signed)
I have called and moved appts later on the morning on 2/20

## 2013-09-15 NOTE — Progress Notes (Signed)
Called the pharmacy to check on the drug Etoposide for the patient. It can only be ran thru medicare part b, not D. They have D and just wondered.

## 2013-09-17 ENCOUNTER — Other Ambulatory Visit: Payer: Self-pay

## 2013-09-17 ENCOUNTER — Telehealth: Payer: Self-pay | Admitting: Internal Medicine

## 2013-09-17 NOTE — Telephone Encounter (Signed)
s.w. pt and advised on 2.23.15 inj....pt ok adn aware...pt aware all appts on 2.20.15 still sched

## 2013-09-18 ENCOUNTER — Telehealth: Payer: Self-pay | Admitting: *Deleted

## 2013-09-18 ENCOUNTER — Other Ambulatory Visit: Payer: Self-pay

## 2013-09-18 ENCOUNTER — Other Ambulatory Visit (HOSPITAL_BASED_OUTPATIENT_CLINIC_OR_DEPARTMENT_OTHER): Payer: Medicare Other

## 2013-09-18 ENCOUNTER — Other Ambulatory Visit: Payer: Self-pay | Admitting: *Deleted

## 2013-09-18 ENCOUNTER — Ambulatory Visit (HOSPITAL_BASED_OUTPATIENT_CLINIC_OR_DEPARTMENT_OTHER): Payer: Medicare Other | Admitting: Pharmacist

## 2013-09-18 ENCOUNTER — Ambulatory Visit: Payer: Medicare Other

## 2013-09-18 DIAGNOSIS — C8590 Non-Hodgkin lymphoma, unspecified, unspecified site: Secondary | ICD-10-CM

## 2013-09-18 DIAGNOSIS — C8589 Other specified types of non-Hodgkin lymphoma, extranodal and solid organ sites: Secondary | ICD-10-CM

## 2013-09-18 DIAGNOSIS — I2699 Other pulmonary embolism without acute cor pulmonale: Secondary | ICD-10-CM

## 2013-09-18 LAB — CBC WITH DIFFERENTIAL/PLATELET
BASO%: 1 % (ref 0.0–2.0)
Basophils Absolute: 0 10*3/uL (ref 0.0–0.1)
EOS%: 1 % (ref 0.0–7.0)
Eosinophils Absolute: 0 10*3/uL (ref 0.0–0.5)
HCT: 26 % — ABNORMAL LOW (ref 34.8–46.6)
HGB: 8.5 g/dL — ABNORMAL LOW (ref 11.6–15.9)
LYMPH#: 1.4 10*3/uL (ref 0.9–3.3)
LYMPH%: 34.6 % (ref 14.0–49.7)
MCH: 31.4 pg (ref 25.1–34.0)
MCHC: 32.7 g/dL (ref 31.5–36.0)
MCV: 95.9 fL (ref 79.5–101.0)
MONO#: 0.5 10*3/uL (ref 0.1–0.9)
MONO%: 11.3 % (ref 0.0–14.0)
NEUT#: 2.1 10*3/uL (ref 1.5–6.5)
NEUT%: 52.1 % (ref 38.4–76.8)
Platelets: 55 10*3/uL — ABNORMAL LOW (ref 145–400)
RBC: 2.71 10*6/uL — AB (ref 3.70–5.45)
RDW: 19 % — ABNORMAL HIGH (ref 11.2–14.5)
WBC: 4 10*3/uL (ref 3.9–10.3)

## 2013-09-18 LAB — PROTIME-INR
INR: 1.4 — ABNORMAL LOW (ref 2.00–3.50)
PROTIME: 16.8 s — AB (ref 10.6–13.4)

## 2013-09-18 LAB — POCT INR: INR: 1.4

## 2013-09-18 NOTE — Patient Instructions (Signed)
Increase Coumadin to 4mg  (2 tablets) daily except for 2mg  on Mondays and Thursdays.   Recheck INR on 09/25/13 at 8 am for lab and 8:15am for Coumadin Clinic.

## 2013-09-18 NOTE — Progress Notes (Signed)
No treatment today due to counts per Dr. Juliann Mule. Spoke with patient in lobby and patient to reschedule lab and treatment for next Friday.

## 2013-09-18 NOTE — Telephone Encounter (Signed)
Per staff message and POF I have scheduled appts.  JMW  

## 2013-09-18 NOTE — Progress Notes (Signed)
INR below goal today and unchanged from previous visit. Pt took coumadin as instructed at last visit. Pt frustrated with lab results today; Hg/Hct:  8.5/26, Pltc: 55 No changes to report in medications. No concerns regarding anticoagulation. No s/s of clotting noted. Pt had turnip greens once in the last week. She is eating much better and gaining weight. Her weight today is 126.6lbs. Will increase Coumadin to 4mg  (2 tablets) daily except for 2mg  on Mondays and Thursdays. This is ~ 30% increase in her weekly dose.   Recheck INR on 09/25/13 at 8 am for lab and 8:15am for Coumadin Clinic.  Coumadin 2mg  tablets provided: (x 20 tablets) Lot:  [id number] Exp: 10/15

## 2013-09-18 NOTE — Telephone Encounter (Signed)
appts made and printed. Pt is here and aware that tx will be added. i emailed MW to add the tx's...td

## 2013-09-21 ENCOUNTER — Other Ambulatory Visit: Payer: Self-pay | Admitting: Internal Medicine

## 2013-09-21 ENCOUNTER — Telehealth: Payer: Self-pay | Admitting: Medical Oncology

## 2013-09-21 ENCOUNTER — Ambulatory Visit: Payer: Medicare Other

## 2013-09-21 NOTE — Telephone Encounter (Signed)
Husband called stating that pt had low temps of 99.7 and 99.0 over the weekend. This am it is 98.7. He states she is feeling well and even ironing clothes. No signs of infection. He just wanted to report to Dr. Juliann Mule. WBC and ANC were good on 10/16/13. I asked him to call if any signs of infection or temp is great than 100.5. He voiced understanding.

## 2013-09-25 ENCOUNTER — Other Ambulatory Visit (HOSPITAL_BASED_OUTPATIENT_CLINIC_OR_DEPARTMENT_OTHER): Payer: Medicare Other

## 2013-09-25 ENCOUNTER — Ambulatory Visit (HOSPITAL_BASED_OUTPATIENT_CLINIC_OR_DEPARTMENT_OTHER): Payer: Medicare Other

## 2013-09-25 ENCOUNTER — Ambulatory Visit: Payer: Medicare Other | Admitting: Pharmacist

## 2013-09-25 ENCOUNTER — Other Ambulatory Visit: Payer: Self-pay | Admitting: Internal Medicine

## 2013-09-25 VITALS — BP 130/70 | HR 83 | Temp 97.6°F | Resp 16

## 2013-09-25 DIAGNOSIS — I2699 Other pulmonary embolism without acute cor pulmonale: Secondary | ICD-10-CM

## 2013-09-25 DIAGNOSIS — Z5111 Encounter for antineoplastic chemotherapy: Secondary | ICD-10-CM

## 2013-09-25 DIAGNOSIS — C8590 Non-Hodgkin lymphoma, unspecified, unspecified site: Secondary | ICD-10-CM

## 2013-09-25 DIAGNOSIS — C8589 Other specified types of non-Hodgkin lymphoma, extranodal and solid organ sites: Secondary | ICD-10-CM

## 2013-09-25 DIAGNOSIS — Z5112 Encounter for antineoplastic immunotherapy: Secondary | ICD-10-CM | POA: Diagnosis not present

## 2013-09-25 DIAGNOSIS — D696 Thrombocytopenia, unspecified: Secondary | ICD-10-CM

## 2013-09-25 LAB — PROTIME-INR
INR: 1.6 — ABNORMAL LOW (ref 2.00–3.50)
PROTIME: 19.2 s — AB (ref 10.6–13.4)

## 2013-09-25 LAB — CBC WITH DIFFERENTIAL/PLATELET
BASO%: 1.1 % (ref 0.0–2.0)
Basophils Absolute: 0.1 10*3/uL (ref 0.0–0.1)
EOS%: 1.5 % (ref 0.0–7.0)
Eosinophils Absolute: 0.1 10*3/uL (ref 0.0–0.5)
HCT: 27.4 % — ABNORMAL LOW (ref 34.8–46.6)
HGB: 9 g/dL — ABNORMAL LOW (ref 11.6–15.9)
LYMPH#: 2.3 10*3/uL (ref 0.9–3.3)
LYMPH%: 51 % — ABNORMAL HIGH (ref 14.0–49.7)
MCH: 31.9 pg (ref 25.1–34.0)
MCHC: 32.8 g/dL (ref 31.5–36.0)
MCV: 97.2 fL (ref 79.5–101.0)
MONO#: 0.4 10*3/uL (ref 0.1–0.9)
MONO%: 9.6 % (ref 0.0–14.0)
NEUT#: 1.7 10*3/uL (ref 1.5–6.5)
NEUT%: 36.8 % — ABNORMAL LOW (ref 38.4–76.8)
NRBC: 1 % — AB (ref 0–0)
Platelets: 70 10*3/uL — ABNORMAL LOW (ref 145–400)
RBC: 2.82 10*6/uL — AB (ref 3.70–5.45)
RDW: 18.5 % — ABNORMAL HIGH (ref 11.2–14.5)
WBC: 4.6 10*3/uL (ref 3.9–10.3)

## 2013-09-25 LAB — POCT INR: INR: 1.6

## 2013-09-25 MED ORDER — SODIUM CHLORIDE 0.9 % IV SOLN
20.0000 mg/m2 | Freq: Once | INTRAVENOUS | Status: AC
  Administered 2013-09-25: 30 mg via INTRAVENOUS
  Filled 2013-09-25: qty 1.5

## 2013-09-25 MED ORDER — DEXAMETHASONE SODIUM PHOSPHATE 20 MG/5ML IJ SOLN
20.0000 mg | Freq: Once | INTRAMUSCULAR | Status: AC
  Administered 2013-09-25: 20 mg via INTRAVENOUS

## 2013-09-25 MED ORDER — DIPHENHYDRAMINE HCL 25 MG PO CAPS
50.0000 mg | ORAL_CAPSULE | Freq: Once | ORAL | Status: AC
  Administered 2013-09-25: 50 mg via ORAL

## 2013-09-25 MED ORDER — VINCRISTINE SULFATE CHEMO INJECTION 1 MG/ML
1.0000 mg | Freq: Once | INTRAVENOUS | Status: AC
  Administered 2013-09-25: 1 mg via INTRAVENOUS
  Filled 2013-09-25: qty 1

## 2013-09-25 MED ORDER — ACETAMINOPHEN 325 MG PO TABS
650.0000 mg | ORAL_TABLET | Freq: Once | ORAL | Status: AC
  Administered 2013-09-25: 650 mg via ORAL

## 2013-09-25 MED ORDER — DEXAMETHASONE SODIUM PHOSPHATE 20 MG/5ML IJ SOLN
INTRAMUSCULAR | Status: AC
  Filled 2013-09-25: qty 5

## 2013-09-25 MED ORDER — ONDANSETRON 16 MG/50ML IVPB (CHCC)
16.0000 mg | Freq: Once | INTRAVENOUS | Status: AC
  Administered 2013-09-25: 16 mg via INTRAVENOUS

## 2013-09-25 MED ORDER — SODIUM CHLORIDE 0.9 % IJ SOLN
10.0000 mL | INTRAMUSCULAR | Status: DC | PRN
  Administered 2013-09-25: 10 mL
  Filled 2013-09-25: qty 10

## 2013-09-25 MED ORDER — SODIUM CHLORIDE 0.9 % IV SOLN
Freq: Once | INTRAVENOUS | Status: AC
  Administered 2013-09-25: 09:00:00 via INTRAVENOUS

## 2013-09-25 MED ORDER — SODIUM CHLORIDE 0.9 % IV SOLN
375.0000 mg/m2 | Freq: Once | INTRAVENOUS | Status: AC
  Administered 2013-09-25: 600 mg via INTRAVENOUS
  Filled 2013-09-25: qty 60

## 2013-09-25 MED ORDER — SODIUM CHLORIDE 0.9 % IV SOLN
350.0000 mg/m2 | Freq: Once | INTRAVENOUS | Status: AC
  Administered 2013-09-25: 560 mg via INTRAVENOUS
  Filled 2013-09-25: qty 28

## 2013-09-25 MED ORDER — DIPHENHYDRAMINE HCL 25 MG PO CAPS
ORAL_CAPSULE | ORAL | Status: AC
  Filled 2013-09-25: qty 2

## 2013-09-25 MED ORDER — ACETAMINOPHEN 325 MG PO TABS
ORAL_TABLET | ORAL | Status: AC
  Filled 2013-09-25: qty 2

## 2013-09-25 MED ORDER — DIPHENHYDRAMINE HCL 25 MG PO CAPS
ORAL_CAPSULE | ORAL | Status: AC
  Filled 2013-09-25: qty 1

## 2013-09-25 MED ORDER — ONDANSETRON 16 MG/50ML IVPB (CHCC)
INTRAVENOUS | Status: AC
  Filled 2013-09-25: qty 16

## 2013-09-25 MED ORDER — HEPARIN SOD (PORK) LOCK FLUSH 100 UNIT/ML IV SOLN
500.0000 [IU] | Freq: Once | INTRAVENOUS | Status: AC | PRN
  Administered 2013-09-25: 500 [IU]
  Filled 2013-09-25: qty 5

## 2013-09-25 NOTE — Progress Notes (Signed)
I saw the patient in the infusion room.  She was accompanied by her husband.  She reports mild swelling in her lower extremities, otherwise she feels well.  She no longer has the persistence in her cough.  She denies any bleeding. Her appetite is stable.   We discussed that her counts demonstrate signs of recovery and we suspect overall required delayed recovery from her prior chemotherapy. She was scheduled for cycle # 5 of R-CEO on 09/11/2013.  However, her plts were still marginal.  We discussed that we will proceed with her R-CEO with further dose reduction and more frequent lab checks to assess her plt and hbg trend for consideration of transfusion.   The patient was agreeable with this plan.

## 2013-09-25 NOTE — Patient Instructions (Signed)
[facility] Discharge Instructions for Patients Receiving Chemotherapy  Today you received the following chemotherapy agents vincristine, etoposide (VP-16), cytoxan and rituxan. Make sure to pick up your etoposide from the pharmacy and take it Thursday and Friday and then return Saturday for an injection.  To help prevent nausea and vomiting after your treatment, we encourage you to take your nausea medication zofran.   If you develop nausea and vomiting that is not controlled by your nausea medication, call the clinic.   BELOW ARE SYMPTOMS THAT SHOULD BE REPORTED IMMEDIATELY:  *FEVER GREATER THAN 100.5 F  *CHILLS WITH OR WITHOUT FEVER  NAUSEA AND VOMITING THAT IS NOT CONTROLLED WITH YOUR NAUSEA MEDICATION  *UNUSUAL SHORTNESS OF BREATH  *UNUSUAL BRUISING OR BLEEDING  TENDERNESS IN MOUTH AND THROAT WITH OR WITHOUT PRESENCE OF ULCERS  *URINARY PROBLEMS  *BOWEL PROBLEMS  UNUSUAL RASH Items with * indicate a potential emergency and should be followed up as soon as possible.  Feel free to call the clinic you have any questions or concerns. The clinic phone number is [phone].    

## 2013-09-25 NOTE — Progress Notes (Addendum)
INR = 1.6 Pltc = 70 today Pt forgot to take her Coumadin last night. She has been taking Coumadin 4 mg daily except 2 mg on Mon/Thurs. Pt c/o B leg pain (post-popliteal).  Also having B ankle swelling.  No redness or swelling post-popliteal.  The pain is less when she walks & "throbs" at rest. She has also had lower stomach & back pain.  APAP helps "some."  She also has been using eucalyptus oil as a topical rub. Still using Nystatin for mouth rinse for the thrush she has had.  Not currently needed Diflucan but she has it if she needs to.  She knows to call us if she starts Diflucan. Her cough is much improved.  She has had no chest pain or SOB. INR subtherapeutic.  Pltc not low enough to hold Coumadin (MD feels should hold Coumadin if pltc < 50). Increase Coumadin to 6 mg today then 4 mg daily. Plan to recheck INR in 2 weeks (lab/chemo already scheduled).  However, if her tx is held today then pt will call us to resched CC appt if she is postponed to next week. Kennith Center, Pharm.D., CPP 09/25/2013@8 :42 AM  Samples: Coumadin 4 mg x 30 tabs (lot# [id number]; exp 04/2014) Kennith Center, Pharm.D., CPP 09/25/2013@8 :43 AM

## 2013-09-28 ENCOUNTER — Ambulatory Visit (HOSPITAL_BASED_OUTPATIENT_CLINIC_OR_DEPARTMENT_OTHER): Payer: Medicare Other

## 2013-09-28 VITALS — BP 162/81 | HR 96 | Temp 97.7°F

## 2013-09-28 DIAGNOSIS — Z5189 Encounter for other specified aftercare: Secondary | ICD-10-CM | POA: Diagnosis not present

## 2013-09-28 DIAGNOSIS — C8589 Other specified types of non-Hodgkin lymphoma, extranodal and solid organ sites: Secondary | ICD-10-CM

## 2013-09-28 DIAGNOSIS — C8590 Non-Hodgkin lymphoma, unspecified, unspecified site: Secondary | ICD-10-CM

## 2013-09-28 MED ORDER — PEGFILGRASTIM INJECTION 6 MG/0.6ML
6.0000 mg | Freq: Once | SUBCUTANEOUS | Status: AC
  Administered 2013-09-28: 6 mg via SUBCUTANEOUS
  Filled 2013-09-28: qty 0.6

## 2013-09-28 NOTE — Patient Instructions (Signed)

## 2013-09-29 ENCOUNTER — Telehealth: Payer: Self-pay | Admitting: Family Medicine

## 2013-09-29 MED ORDER — FUROSEMIDE 20 MG PO TABS: 20.0000 mg | ORAL_TABLET | Freq: Every day | ORAL | Status: DC

## 2013-09-29 NOTE — Telephone Encounter (Signed)
Check with Roneka on this and she is she wants to switch

## 2013-09-29 NOTE — Telephone Encounter (Signed)
Lasix called in to help with peripheral edema

## 2013-09-29 NOTE — Telephone Encounter (Signed)
Pt called back & states her ankles & legs are puffy again & asked that you give her Lasix again

## 2013-09-29 NOTE — Telephone Encounter (Signed)
Letter from [place] states formulary alternative is Tenormin (Atenolol).  Do you want to switch to either of these?

## 2013-10-02 NOTE — Telephone Encounter (Signed)
Left message with husband Tonya Wise

## 2013-10-05 MED ORDER — METOPROLOL TARTRATE 25 MG PO TABS: 25.0000 mg | ORAL_TABLET | Freq: Two times a day (BID) | ORAL | Status: DC

## 2013-10-05 NOTE — Telephone Encounter (Signed)
Per [facility], metoprolol tartrate 25,50,100 mgs up to 60 per 30 days is $4.  Also Atenolol 25,50,100 mg #30 for 30 $4 also

## 2013-10-05 NOTE — Telephone Encounter (Signed)
Pt wants to switch to Metoprolol at [facility]

## 2013-10-06 ENCOUNTER — Encounter: Payer: Self-pay | Admitting: Internal Medicine

## 2013-10-06 ENCOUNTER — Other Ambulatory Visit: Payer: Self-pay | Admitting: Medical Oncology

## 2013-10-07 ENCOUNTER — Telehealth: Payer: Self-pay | Admitting: *Deleted

## 2013-10-07 NOTE — Telephone Encounter (Signed)
sw the pt informed her that her tx will be moved from 10/09/13 to the 20th of march. i also made her aware that i moved her appt for coa to 10/09/13@ 9:30am...pt is aware...td

## 2013-10-07 NOTE — Telephone Encounter (Signed)
Per staff message and POF I have scheduled appts.  JMW  

## 2013-10-09 ENCOUNTER — Ambulatory Visit: Payer: Medicare Other

## 2013-10-09 ENCOUNTER — Ambulatory Visit (HOSPITAL_BASED_OUTPATIENT_CLINIC_OR_DEPARTMENT_OTHER): Payer: Medicare Other | Admitting: Pharmacist

## 2013-10-09 ENCOUNTER — Ambulatory Visit (HOSPITAL_BASED_OUTPATIENT_CLINIC_OR_DEPARTMENT_OTHER): Payer: Medicare Other | Admitting: Internal Medicine

## 2013-10-09 ENCOUNTER — Other Ambulatory Visit (HOSPITAL_BASED_OUTPATIENT_CLINIC_OR_DEPARTMENT_OTHER): Payer: Medicare Other

## 2013-10-09 VITALS — BP 167/79 | HR 86 | Temp 98.3°F | Resp 18 | Ht 62.0 in | Wt 124.2 lb

## 2013-10-09 DIAGNOSIS — I509 Heart failure, unspecified: Secondary | ICD-10-CM

## 2013-10-09 DIAGNOSIS — I429 Cardiomyopathy, unspecified: Secondary | ICD-10-CM

## 2013-10-09 DIAGNOSIS — E876 Hypokalemia: Secondary | ICD-10-CM

## 2013-10-09 DIAGNOSIS — D6959 Other secondary thrombocytopenia: Secondary | ICD-10-CM

## 2013-10-09 DIAGNOSIS — I2699 Other pulmonary embolism without acute cor pulmonale: Secondary | ICD-10-CM

## 2013-10-09 DIAGNOSIS — C8589 Other specified types of non-Hodgkin lymphoma, extranodal and solid organ sites: Secondary | ICD-10-CM

## 2013-10-09 DIAGNOSIS — I5189 Other ill-defined heart diseases: Secondary | ICD-10-CM

## 2013-10-09 DIAGNOSIS — C8590 Non-Hodgkin lymphoma, unspecified, unspecified site: Secondary | ICD-10-CM

## 2013-10-09 LAB — CBC WITH DIFFERENTIAL/PLATELET
BASO%: 0.2 % (ref 0.0–2.0)
Basophils Absolute: 0 10*3/uL (ref 0.0–0.1)
EOS ABS: 0.1 10*3/uL (ref 0.0–0.5)
EOS%: 1.6 % (ref 0.0–7.0)
HCT: 27.6 % — ABNORMAL LOW (ref 34.8–46.6)
HGB: 9.1 g/dL — ABNORMAL LOW (ref 11.6–15.9)
LYMPH#: 1.4 10*3/uL (ref 0.9–3.3)
LYMPH%: 22.2 % (ref 14.0–49.7)
MCH: 33.6 pg (ref 25.1–34.0)
MCHC: 33.1 g/dL (ref 31.5–36.0)
MCV: 101.5 fL — ABNORMAL HIGH (ref 79.5–101.0)
MONO#: 0.4 10*3/uL (ref 0.1–0.9)
MONO%: 7.2 % (ref 0.0–14.0)
NEUT%: 68.8 % (ref 38.4–76.8)
NEUTROS ABS: 4.2 10*3/uL (ref 1.5–6.5)
PLATELETS: 59 10*3/uL — AB (ref 145–400)
RBC: 2.72 10*6/uL — ABNORMAL LOW (ref 3.70–5.45)
RDW: 18.5 % — ABNORMAL HIGH (ref 11.2–14.5)
WBC: 6.1 10*3/uL (ref 3.9–10.3)

## 2013-10-09 LAB — COMPREHENSIVE METABOLIC PANEL (CC13)
ALBUMIN: 3.3 g/dL — AB (ref 3.5–5.0)
ALT: 18 U/L (ref 0–55)
ANION GAP: 11 meq/L (ref 3–11)
AST: 28 U/L (ref 5–34)
Alkaline Phosphatase: 122 U/L (ref 40–150)
BUN: 10.9 mg/dL (ref 7.0–26.0)
CHLORIDE: 110 meq/L — AB (ref 98–109)
CO2: 25 mEq/L (ref 22–29)
Calcium: 8.7 mg/dL (ref 8.4–10.4)
Creatinine: 1 mg/dL (ref 0.6–1.1)
GLUCOSE: 112 mg/dL (ref 70–140)
POTASSIUM: 3 meq/L — AB (ref 3.5–5.1)
Sodium: 146 mEq/L — ABNORMAL HIGH (ref 136–145)
Total Bilirubin: 0.58 mg/dL (ref 0.20–1.20)
Total Protein: 5.1 g/dL — ABNORMAL LOW (ref 6.4–8.3)

## 2013-10-09 LAB — PROTIME-INR
INR: 1.8 — ABNORMAL LOW (ref 2.00–3.50)
PROTIME: 21.6 s — AB (ref 10.6–13.4)

## 2013-10-09 LAB — POCT INR: INR: 1.8

## 2013-10-09 MED ORDER — POTASSIUM CHLORIDE CRYS ER 10 MEQ PO TBCR: 10.0000 meq | EXTENDED_RELEASE_TABLET | Freq: Every day | ORAL | Status: DC

## 2013-10-09 NOTE — Patient Instructions (Signed)
Hypokalemia Hypokalemia means that the amount of potassium in the blood is lower than normal.Potassium is a chemical, called an electrolyte, that helps regulate the amount of fluid in the body. It also stimulates muscle contraction and helps nerves function properly.Most of the body's potassium is inside of cells, and only a very small amount is in the blood. Because the amount in the blood is so small, minor changes can be life-threatening. CAUSES  Antibiotics.  Diarrhea or vomiting.  Using laxatives too much, which can cause diarrhea.  Chronic kidney disease.  Water pills (diuretics).  Eating disorders (bulimia).  Low magnesium level.  Sweating a lot. SIGNS AND SYMPTOMS  Weakness.  Constipation.  Fatigue.  Muscle cramps.  Mental confusion.  Skipped heartbeats or irregular heartbeat (palpitations).  Tingling or numbness. DIAGNOSIS  Your health care provider can diagnose hypokalemia with blood tests. In addition to checking your potassium level, your health care provider may also check other lab tests. TREATMENT Hypokalemia can be treated with potassium supplements taken by mouth or adjustments in your current medicines. If your potassium level is very low, you may need to get potassium through a vein (IV) and be monitored in the hospital. A diet high in potassium is also helpful. Foods high in potassium are:  Nuts, such as peanuts and pistachios.  Seeds, such as sunflower seeds and pumpkin seeds.  Peas, lentils, and lima beans.  Whole grain and bran cereals and breads.  Fresh fruit and vegetables, such as apricots, avocado, bananas, cantaloupe, kiwi, oranges, tomatoes, asparagus, and potatoes.  Orange and tomato juices.  Red meats.  Fruit yogurt. HOME CARE INSTRUCTIONS  Take all medicines as prescribed by your health care provider.  Maintain a healthy diet by including nutritious food, such as fruits, vegetables, nuts, whole grains, and lean meats.  If  you are taking a laxative, be sure to follow the directions on the label. SEEK MEDICAL CARE IF:  Your weakness gets worse.  You feel your heart pounding or racing.  You are vomiting or having diarrhea.  You are diabetic and having trouble keeping your blood glucose in the normal range. SEEK IMMEDIATE MEDICAL CARE IF:  You have chest pain, shortness of breath, or dizziness.  You are vomiting or having diarrhea for more than 2 days.  You faint. MAKE SURE YOU:   Understand these instructions.  Will watch your condition.  Will get help right away if you are not doing well or get worse. Document Released: 07/16/2005 Document Revised: 05/06/2013 Document Reviewed: 01/16/2013 [employer] Patient Information 2014 [employer].  

## 2013-10-09 NOTE — Progress Notes (Signed)
INR increasing toward goal of 2-3.  No medication changes.  Ms Baehr has noticed some bleeding with BM which she thinks is related to a hemorrhoid. Coumadin dosing has been very erratic with multiple coumadin dose changes.  Will have Ms Delgadillo take 6mg  today then continue 4mg  daily.  INR may be slow to respond to dose changes.  Ms Stice has appt with Dr. Juliann Mule today.

## 2013-10-11 NOTE — Progress Notes (Signed)
[place] NOTE  Tonya Haste, MD [place] [address]  DIAGNOSIS: Non-Hodgkins lymphoma - Plan: CBC with Differential, Comprehensive metabolic panel (Cmet) - [facility], Lactate dehydrogenase (LDH) - [facility], CBC with Differential, CBC with Differential, Comprehensive metabolic panel (Cmet) - [facility], Lactate dehydrogenase (LDH) - [facility], NM PET Image Restag (PS) Skull Base To Thigh  Other pulmonary embolism and infarction  Cardiomyopathy  Diastolic dysfunction grade 2  Hypokalemia - Plan: potassium chloride (KLOR-CON M10) 10 MEQ tablet  Chief Complaint  Patient presents with  . Lymphoma      Non-Hodgkins lymphoma   03/27/2013 - 03/31/2013 Hospital Admission Admitted to [facility] for profound anemia (Hgb 6.4).  C/o fevers, drenching nightsweats, weight lost.  Negative colonoscopy and EGD.  CT of C/A/P demonstrated significant lymphadenopathy suspcious of ympha.     03/31/2013 Initial Diagnosis Biopsy of R inguinal lymph node revealed Non-Hodgkins lymphoma. Diffuse Large B-cell Lymphoma   04/10/2013 - 04/24/2013 Hospital Admission Hypercalcemia, renal failure and dehydration.  Started min-RCHOP.    04/15/2013 Bone Marrow Biopsy Involvement with Diffuse Large B-cell Lymphoma. Stage IV NHL w B-symptoms.    04/18/2013 - 04/18/2013 Chemotherapy Mini R-CHOP Cycle #1 on 04/18/2013. Dose reduced to poor nutrion. It consisted on Cytoxan 400 mg/m2,Doxorubicin 25 mg/m2, Rituximab 375 mg/m2, Vincristine 26m.  Prednisone 100 mg daily for 9/13 - 9/16.   Received neupogen on 9/21, 9/27 -9/30.     04/25/2013 - 04/29/2013 Hospital Admission Admitted due to worsening mouth sores/oral thrush, odynophagia, failure to thrive.  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  [facility]Dr[facility] CGwynneth Munson and discharged to home on 05/19/2013.     06/01/2013 - 06/01/2013 Chemotherapy Mini R-CHO #2 dosed as above.   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  Received neulasta shot on 06/02/13   06/16/2013 Cancer Staging Re-staging PET/CT demonstrated significant interval reduction in axillary lymphadenopathy.  There was 1 small residual right axillary lymph node measuring 16 x 12 mm which showed FDG uptake.  Signifcant reduction in mediastinal and hilar lymphadenopathy.    06/16/2013 Imaging CT of chest noted left-sided pulmonary embolim. Started on lovenox to coumadin bridge.     06/22/2013 - 06/22/2013 Chemotherapy Mini-R-CHO #3.  Received Neulasta on 06/23/2013.     07/16/2013 Imaging 2-D Echocardiogram demonstrates 35-45% EF with Grade 2 diastolic dysfunction.  Doxorubicin discontinued with consultation by cardiology (Dr. KClaiborne Billings.     08/19/2013 - 08/21/2013 Chemotherapy Mini-R-CEO #4.  Etoposide 25 mg/m2 instead of doxo due to above.  Etoposide 594mbid on day 2/3.  On day 3 (08/21/2013) she received 1 of two oral doses of ectoposide.    08/21/2013 - 08/26/2013 Hospital [facility] and had influenzae A and pneumonia. Completed tamiflu and oral antibiotics.    09/25/2013 - 09/27/2013 Chemotherapy Mini-R-CEO #5. Etoposide 25 mg/m2 25 mg bid on days 2/3.    INTERVAL HISTORY: Tonya Wise.o. female with the above is here for follow-up.  She completed cycle #5 of mini-R-CEO.  She denies any fevers.  She reports that her appetite has improved.  She complains of mild tingling of her tongue making it hard to tolerate some foods especially sweets.  She states that she does not want any medicines which will make it hard for her coumadin management.  She presents along with her husband [name]. She reports improvement in her cough. She does report occasional abdominal discomfort  described as a banding pattern not relieved to her bowel movements.  She report occasional blood on the tissue paper but otherwise no melena or hematochezia.  She is being  followed by the coumadin clinic. She has a follow-up with her Cardiologist Dr. Claiborne Billings next week.   MEDICAL HISTORY: Past Medical History  Diagnosis Date  . Hypertension   . Renal insufficiency   . Osteopenia   . Allergy   . Heart murmur   . GERD (gastroesophageal reflux disease)   . Anemia   . Hypercalcemia 03/27/2013  . Non-Hodgkins lymphoma 04/06/2013  . Diastolic dysfunction, grade I 04/12/2013    INTERIM HISTORY: has Hypercalcemia; Non-Hodgkins lymphoma; Elevated LFTs; Protein-calorie malnutrition, severe; Anemia of chronic disease; Acute on chronic combined systolic and diastolic heart failure; Other pulmonary embolism and infarction; Acute on chronic cough; Dysphagia, unspecified(787.20); Cardiomyopathy; Diastolic dysfunction grade 2; PNA (pneumonia); Influenza with pneumonia; Thrombocytopenia, unspecified; Acute respiratory failure with hypoxia secondary to influenza A with pneumonia; UTI (urinary tract infection); History of pulmonary embolism; HTN (hypertension); Hypokalemia; and Thrush on her problem list.    ALLERGIES:  is allergic to penicillins.  MEDICATIONS: has a current medication list which includes the following prescription(s): albuterol, furosemide, hydrocodone-homatropine, lidocaine-prilocaine, losartan, metoprolol tartrate, mirtazapine, multivitamin with minerals, nystatin, omeprazole, ondansetron, potassium chloride, valacyclovir, and warfarin.  SURGICAL HISTORY:  Past Surgical History  Procedure Laterality Date  . Appendectomy    . Colonoscopy  2008  . Spine surgery    . Back surgery      LOWER BACK TWICE  . Abdominal hysterectomy    . Esophagogastroduodenoscopy N/A 03/29/2013    Procedure: ESOPHAGOGASTRODUODENOSCOPY (EGD);  Surgeon: Juanita Craver, MD;  Location: [facility] ENDOSCOPY;  Service: Endoscopy;  Laterality: N/A;  . Inguinal lymph node biopsy Right 03/31/2013    Procedure: INGUINAL LYMPH NODE BIOPSY;  Surgeon: Gwenyth Ober, MD;  Location: [place];  Service: General;   Laterality: Right;    REVIEW OF SYSTEMS:   Constitutional: Denies fevers, chills; She continues to have decreased in her taste.  Eyes: Denies blurriness of vision Ears, nose, mouth, throat, and face: Reports mild mucositis but denies a sore throat Respiratory: Denies cough, dyspnea or wheezes Cardiovascular: Denies palpitation, chest discomfort or lower extremity swelling Gastrointestinal:  Denies nausea, heartburn or change in bowel habits; abdominal discomfort as noted above.  Skin: Denies abnormal skin rashes Lymphatics: Denies new lymphadenopathy or easy bruising Neurological:Denies numbness, tingling or new weaknesses Behavioral/Psych: Mood is stable, no new changes  All other systems were reviewed with the patient and are negative.  PHYSICAL EXAMINATION: ECOG PERFORMANCE STATUS: 0 - Asymptomatic  Blood pressure 167/79, pulse 86, temperature 98.3 F (36.8 C), temperature source Oral, resp. rate 18, height '5\' 2"'  (1.575 m), weight 124 lb 3.2 oz (56.337 kg), SpO2 97.00%.  GENERAL:alert, no distress and comfortable; easily ambulatory.  SKIN: skin color, texture, turgor are normal, no rashes or significant lesions.  EYES: normal, Conjunctiva are pink and non-injected, sclera clear OROPHARYNX:no exudate, no erythema and lips, buccal mucosa, and tongue with smooth surface suggestive of recovering mucositis.    NECK: supple, thyroid normal size, non-tender, without nodularity LYMPH:  no palpable lymphadenopathy in the cervical, axillary or supraclavicular LUNGS: clear to auscultation and percussion with normal breathing effort HEART: regular rate & rhythm and no murmurs and no lower extremity edema ABDOMEN:abdomen soft, non-tender and normal bowel sounds Musculoskeletal:no cyanosis of digits and no clubbing  NEURO: alert & oriented x 3 with fluent speech, no focal motor/sensory deficits  Labs:  Lab Results  Component Value Date   WBC 6.1 10/09/2013   HGB 9.1* 10/09/2013   HCT 27.6*  10/09/2013   MCV 101.5* 10/09/2013   PLT 59* 10/09/2013   NEUTROABS 4.2 10/09/2013      Chemistry      Component Value Date/Time   NA 146* 10/09/2013 0832   NA 139 08/26/2013 0945   K 3.0* 10/09/2013 0832   K 3.1* 08/26/2013 0945   CL 101 08/26/2013 0945   CO2 25 10/09/2013 0832   CO2 27 08/26/2013 0945   BUN 10.9 10/09/2013 0832   BUN 8 08/26/2013 0945   CREATININE 1.0 10/09/2013 0832   CREATININE 0.81 08/26/2013 0945   CREATININE 0.85 07/17/2013 0834      Component Value Date/Time   CALCIUM 8.7 10/09/2013 0832   CALCIUM 8.5 08/26/2013 0945   ALKPHOS 122 10/09/2013 0832   ALKPHOS 59 08/21/2013 1535   AST 28 10/09/2013 0832   AST 20 08/21/2013 1535   ALT 18 10/09/2013 0832   ALT 8 08/21/2013 1535   BILITOT 0.58 10/09/2013 0832   BILITOT 0.6 08/21/2013 1535      Basic Metabolic Panel:  Recent Labs Lab 10/09/13 0832  NA 146*  K 3.0*  CO2 25  GLUCOSE 112  BUN 10.9  CREATININE 1.0  CALCIUM 8.7   GFR Estimated Creatinine Clearance: 40.2 ml/min (by C-G formula based on Cr of 1). Liver Function Tests:  Recent Labs Lab 10/09/13 0832  AST 28  ALT 18  ALKPHOS 122  BILITOT 0.58  PROT 5.1*  ALBUMIN 3.3*   Coagulation profile  Recent Labs Lab 10/09/13 10/09/13 0832  INR 1.8 1.80*  PROTIME  --  21.6*    CBC:  Recent Labs Lab 10/09/13 0832  WBC 6.1  NEUTROABS 4.2  HGB 9.1*  HCT 27.6*  MCV 101.5*  PLT 59*   Studies:  No results found.   RADIOGRAPHIC STUDIES: Dg Chest 2 View  08/21/2013   CLINICAL DATA:  Pain.  Shortness of breath.  EXAM: CHEST  2 VIEW  COMPARISON:  PET-CT 06/16/2013.  Chest radiograph 04/25/2013.  FINDINGS: Right-sided Port-A-Cath remains in place with tip overlying the lower SVC, unchanged. Cardiomediastinal silhouette is within normal limits. There are new/ increased patchy opacities within the right middle lobe. There is also a new opacity in the left and possibly right lower lobes. No pleural effusion is identified. There is no pneumothorax.  Posterior lumbar fixation hardware is partially visualized.  IMPRESSION: New/ increased opacity in the right middle lobe as well as left and possibly right lower lobes. Findings may reflect multifocal pneumonia and/or aspiration.   Electronically Signed   By: Logan Bores   On: 08/21/2013 15:19    ASSESSMENT: Tonya Wise 73 y.o. female with a history of Non-Hodgkins lymphoma - Plan: CBC with Differential, Comprehensive metabolic panel (Cmet) - [facility], Lactate dehydrogenase (LDH) - [facility], CBC with Differential, CBC with Differential, Comprehensive metabolic panel (Cmet) - [facility], Lactate dehydrogenase (LDH) - [facility], NM PET Image Restag (PS) Skull Base To Thigh  Other pulmonary embolism and infarction  Cardiomyopathy  Diastolic dysfunction grade 2  Hypokalemia - Plan: potassium chloride (KLOR-CON M10) 10 MEQ tablet   PLAN:  1. 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 -- She continues to do well overall.  She is scheduled for min- R-CEO next week on 3/19.  Her plts are 59K and we will allow an additional week of recovery. Her bleeding risk is high (she is currently being anticoagulated on  coumadin), I felt this was the safest way to proceed.   We will repeat her counts next Friday and proceed accordingly.  --Prednisone was held due to psychosis, confusion and at patient's request.  --She will have PET following her 6th cycle and bone marrow if there is no evidence of disease.   2. 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 --INR is 1.8.  She is being followed by anticoagulation clinic.  Treatment duration is 6 months.  PE likely secondary to immobilization during recent hospitalizations as noted above.  Continue coumadin and we will plan to hold if Plts less than 50K.    3. History of HCAP, staph coag negative likely contaminant, + Influenzae A, resolved.  --Oxygenation and cough improved.   4. Diastolic/systolic CHF (EF 83-07%).  --Doxorubicin was discontinued due to worsening congestive heart failure. She is  evolemic today.  She was started on lasix prn and has a scheduled follow up with Dr. Claiborne Billings.  5. Hypokalemia, likely secondary to lasix. --We gave a her a prescription for KDur and will repeat labs in one week.  Provided a handout on hypokalemia and its symptoms.    6. Thrombocytopenia secondary to chemotherapy.  --Already dose reduced.  We will allow additional count recovery time.   7. 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  --Continue valcyte prophylaxis and diflucan prn  7. Follow-up.  --Patient will have labs checked on 03/20 and proceed with R-CEO cycle #6.  They had concerns about the cost of the etoposide (oral).  I discussed changing to intravenous administration, but they declined.  F/u in 3  weeks.  PET s/p 6 cycles on 04/03.   All questions were answered. The patient knows to call the clinic with any problems, questions or concerns. We can certainly see the patient much sooner if necessary.  I spent 15 minutes counseling the patient face to face. The total time spent in the appointment was 25 minutes.    Tonya Weyland, MD 10/11/2013 4:57 PM

## 2013-10-13 ENCOUNTER — Telehealth: Payer: Self-pay | Admitting: *Deleted

## 2013-10-13 NOTE — Telephone Encounter (Signed)
Per staff message and POF I have scheduled appts.  JMW  

## 2013-10-14 ENCOUNTER — Ambulatory Visit (INDEPENDENT_AMBULATORY_CARE_PROVIDER_SITE_OTHER): Payer: Medicare Other | Admitting: Cardiovascular Disease

## 2013-10-14 VITALS — BP 154/90 | HR 80 | Ht 63.0 in | Wt 125.0 lb

## 2013-10-14 DIAGNOSIS — I5043 Acute on chronic combined systolic (congestive) and diastolic (congestive) heart failure: Secondary | ICD-10-CM

## 2013-10-14 DIAGNOSIS — D638 Anemia in other chronic diseases classified elsewhere: Secondary | ICD-10-CM

## 2013-10-14 DIAGNOSIS — C8589 Other specified types of non-Hodgkin lymphoma, extranodal and solid organ sites: Secondary | ICD-10-CM | POA: Diagnosis not present

## 2013-10-14 DIAGNOSIS — Z86711 Personal history of pulmonary embolism: Secondary | ICD-10-CM

## 2013-10-14 DIAGNOSIS — J9600 Acute respiratory failure, unspecified whether with hypoxia or hypercapnia: Secondary | ICD-10-CM

## 2013-10-14 DIAGNOSIS — C8590 Non-Hodgkin lymphoma, unspecified, unspecified site: Secondary | ICD-10-CM

## 2013-10-14 DIAGNOSIS — I10 Essential (primary) hypertension: Secondary | ICD-10-CM

## 2013-10-14 DIAGNOSIS — J9601 Acute respiratory failure with hypoxia: Secondary | ICD-10-CM

## 2013-10-14 NOTE — Patient Instructions (Signed)
Your physician has requested that you have an echocardiogram. Echocardiography is a painless test that uses sound waves to create images of your heart. It provides your doctor with information about the size and shape of your heart and how well your heart's chambers and valves are working. This procedure takes approximately one hour. There are no restrictions for this procedure. This will be done in August 2015.  Follow up appointment in September.

## 2013-10-16 ENCOUNTER — Telehealth: Payer: Self-pay | Admitting: Pharmacist

## 2013-10-16 ENCOUNTER — Other Ambulatory Visit (HOSPITAL_BASED_OUTPATIENT_CLINIC_OR_DEPARTMENT_OTHER): Payer: Medicare Other

## 2013-10-16 ENCOUNTER — Ambulatory Visit: Payer: Medicare Other | Admitting: Pharmacist

## 2013-10-16 ENCOUNTER — Other Ambulatory Visit: Payer: Self-pay | Admitting: Internal Medicine

## 2013-10-16 ENCOUNTER — Ambulatory Visit (HOSPITAL_BASED_OUTPATIENT_CLINIC_OR_DEPARTMENT_OTHER): Payer: Medicare Other

## 2013-10-16 VITALS — BP 145/70 | HR 87 | Temp 98.3°F | Resp 20

## 2013-10-16 DIAGNOSIS — I2699 Other pulmonary embolism without acute cor pulmonale: Secondary | ICD-10-CM | POA: Diagnosis not present

## 2013-10-16 DIAGNOSIS — C8589 Other specified types of non-Hodgkin lymphoma, extranodal and solid organ sites: Secondary | ICD-10-CM

## 2013-10-16 DIAGNOSIS — Z5111 Encounter for antineoplastic chemotherapy: Secondary | ICD-10-CM | POA: Diagnosis not present

## 2013-10-16 DIAGNOSIS — C8590 Non-Hodgkin lymphoma, unspecified, unspecified site: Secondary | ICD-10-CM

## 2013-10-16 DIAGNOSIS — Z5112 Encounter for antineoplastic immunotherapy: Secondary | ICD-10-CM | POA: Diagnosis not present

## 2013-10-16 LAB — COMPREHENSIVE METABOLIC PANEL (CC13)
ALBUMIN: 3.5 g/dL (ref 3.5–5.0)
ALT: 19 U/L (ref 0–55)
AST: 28 U/L (ref 5–34)
Alkaline Phosphatase: 131 U/L (ref 40–150)
Anion Gap: 11 mEq/L (ref 3–11)
BUN: 22.7 mg/dL (ref 7.0–26.0)
CALCIUM: 9.3 mg/dL (ref 8.4–10.4)
CO2: 22 mEq/L (ref 22–29)
CREATININE: 1 mg/dL (ref 0.6–1.1)
Chloride: 111 mEq/L — ABNORMAL HIGH (ref 98–109)
GLUCOSE: 105 mg/dL (ref 70–140)
POTASSIUM: 4.5 meq/L (ref 3.5–5.1)
Sodium: 144 mEq/L (ref 136–145)
Total Bilirubin: 0.76 mg/dL (ref 0.20–1.20)
Total Protein: 5.5 g/dL — ABNORMAL LOW (ref 6.4–8.3)

## 2013-10-16 LAB — CBC WITH DIFFERENTIAL/PLATELET
BASO%: 1.2 % (ref 0.0–2.0)
Basophils Absolute: 0.1 10*3/uL (ref 0.0–0.1)
EOS ABS: 0.1 10*3/uL (ref 0.0–0.5)
EOS%: 0.7 % (ref 0.0–7.0)
HCT: 31.1 % — ABNORMAL LOW (ref 34.8–46.6)
HEMOGLOBIN: 9.9 g/dL — AB (ref 11.6–15.9)
LYMPH%: 29.4 % (ref 14.0–49.7)
MCH: 32.7 pg (ref 25.1–34.0)
MCHC: 31.8 g/dL (ref 31.5–36.0)
MCV: 102.6 fL — AB (ref 79.5–101.0)
MONO#: 0.7 10*3/uL (ref 0.1–0.9)
MONO%: 10 % (ref 0.0–14.0)
NEUT%: 58.7 % (ref 38.4–76.8)
NEUTROS ABS: 3.9 10*3/uL (ref 1.5–6.5)
Platelets: 61 10*3/uL — ABNORMAL LOW (ref 145–400)
RBC: 3.03 10*6/uL — ABNORMAL LOW (ref 3.70–5.45)
RDW: 18.2 % — AB (ref 11.2–14.5)
WBC: 6.7 10*3/uL (ref 3.9–10.3)
lymph#: 2 10*3/uL (ref 0.9–3.3)
nRBC: 0 % (ref 0–0)

## 2013-10-16 LAB — PROTIME-INR
INR: 1.4 — ABNORMAL LOW (ref 2.00–3.50)
PROTIME: 16.8 s — AB (ref 10.6–13.4)

## 2013-10-16 LAB — POCT INR: INR: 1.4

## 2013-10-16 LAB — LACTATE DEHYDROGENASE (CC13): LDH: 328 U/L — AB (ref 125–245)

## 2013-10-16 MED ORDER — DIPHENHYDRAMINE HCL 25 MG PO CAPS
50.0000 mg | ORAL_CAPSULE | Freq: Once | ORAL | Status: AC
  Administered 2013-10-16: 50 mg via ORAL

## 2013-10-16 MED ORDER — DEXAMETHASONE SODIUM PHOSPHATE 20 MG/5ML IJ SOLN
20.0000 mg | Freq: Once | INTRAMUSCULAR | Status: AC
  Administered 2013-10-16: 20 mg via INTRAVENOUS

## 2013-10-16 MED ORDER — ONDANSETRON 16 MG/50ML IVPB (CHCC)
16.0000 mg | Freq: Once | INTRAVENOUS | Status: AC
  Administered 2013-10-16: 16 mg via INTRAVENOUS

## 2013-10-16 MED ORDER — DIPHENHYDRAMINE HCL 25 MG PO CAPS
ORAL_CAPSULE | ORAL | Status: AC
  Filled 2013-10-16: qty 2

## 2013-10-16 MED ORDER — VINCRISTINE SULFATE CHEMO INJECTION 1 MG/ML
1.0000 mg | Freq: Once | INTRAVENOUS | Status: AC
  Administered 2013-10-16: 1 mg via INTRAVENOUS
  Filled 2013-10-16: qty 1

## 2013-10-16 MED ORDER — SODIUM CHLORIDE 0.9 % IJ SOLN
10.0000 mL | INTRAMUSCULAR | Status: DC | PRN
  Administered 2013-10-16: 10 mL
  Filled 2013-10-16: qty 10

## 2013-10-16 MED ORDER — DEXAMETHASONE SODIUM PHOSPHATE 20 MG/5ML IJ SOLN
INTRAMUSCULAR | Status: AC
  Filled 2013-10-16: qty 5

## 2013-10-16 MED ORDER — HEPARIN SOD (PORK) LOCK FLUSH 100 UNIT/ML IV SOLN
500.0000 [IU] | Freq: Once | INTRAVENOUS | Status: AC | PRN
  Administered 2013-10-16: 500 [IU]
  Filled 2013-10-16: qty 5

## 2013-10-16 MED ORDER — SODIUM CHLORIDE 0.9 % IV SOLN
375.0000 mg/m2 | Freq: Once | INTRAVENOUS | Status: AC
  Administered 2013-10-16: 600 mg via INTRAVENOUS
  Filled 2013-10-16: qty 60

## 2013-10-16 MED ORDER — SODIUM CHLORIDE 0.9 % IV SOLN
Freq: Once | INTRAVENOUS | Status: AC
  Administered 2013-10-16: 10:00:00 via INTRAVENOUS

## 2013-10-16 MED ORDER — ACETAMINOPHEN 325 MG PO TABS
ORAL_TABLET | ORAL | Status: AC
  Filled 2013-10-16: qty 2

## 2013-10-16 MED ORDER — ACETAMINOPHEN 325 MG PO TABS
650.0000 mg | ORAL_TABLET | Freq: Once | ORAL | Status: AC
  Administered 2013-10-16: 650 mg via ORAL

## 2013-10-16 MED ORDER — ONDANSETRON 16 MG/50ML IVPB (CHCC)
INTRAVENOUS | Status: AC
  Filled 2013-10-16: qty 16

## 2013-10-16 MED ORDER — SODIUM CHLORIDE 0.9 % IV SOLN
20.0000 mg/m2 | Freq: Once | INTRAVENOUS | Status: AC
  Administered 2013-10-16: 30 mg via INTRAVENOUS
  Filled 2013-10-16: qty 1.5

## 2013-10-16 MED ORDER — CYCLOPHOSPHAMIDE CHEMO INJECTION 1 GM
355.0000 mg/m2 | Freq: Once | INTRAMUSCULAR | Status: AC
  Administered 2013-10-16: 560 mg via INTRAVENOUS
  Filled 2013-10-16: qty 28

## 2013-10-16 NOTE — Telephone Encounter (Signed)
I called pt today at home since her appts have not been set yet for next week.  I spoke w/ her husband, Wille Glaser.  He agreed to bring Jarielys on 10/21/13 at 10 am for lab; 10:15 am for Coumadin clinic. If appts are scheduled differently based on MD request from today then [name] will call pharmacy & try to coordinate a better time for the CC visit on 10/21/13. Kennith Center, Pharm.D., CPP 10/16/2013@3 :30 PM

## 2013-10-16 NOTE — Progress Notes (Signed)
Per Dr. Juliann Mule- ok to treat despite platelet count 61. Will need to recheck lab on Monday when pt returns for Nuelasta injection. Also check CBC on 3/27.

## 2013-10-16 NOTE — Progress Notes (Signed)
INR = 1.4 on Coumadin 4 mg daily. Pt did take 6 mg last Fri as instructed. No missed doses. Pltc is low today but pt is getting tx.  She will be monitored closely next week (Mon/Wed) w/ CBC. She reports having bleeding from her gums when flossing.  She also had light pink blood per rectum last week w/ BM.  She has a hemorrhoid. Pts husband asked if pt should stop taking the MVit (contains vit K) so that her INR could come up.  However, Dr. Juliann Mule requested that she remain on this during her Coumadin tx.  It is worth noting that she was placed on the vit K when her INR's were elevated at the beginning of her tx. Med change: off Zebeta & on Lopressor due to insur payment. INR below goal.  I will increase her Coumadin dose to 5 mg daily.   Pt will remain on her MVit for the duration of her Coumadin unless Dr. Juliann Mule feels it is no longer necessary. Repeat INR next Wed when she will be here already for CBC check. Samples: Coumadin 5 mg (lot #[id number]; exp 03/2015) Kennith Center, Pharm.D., CPP 10/16/2013@10 :50 AM

## 2013-10-16 NOTE — Patient Instructions (Signed)
[facility] Discharge Instructions for Patients Receiving Chemotherapy  **LOW PLATELET PRECAUTIONS-**  USE A SOFT TOOTHBRUSH DO NOT USE SUPPOSITORIES OR ENEMAS- TAKE A STOOL SOFTENER TO AVOID CONSTIPATION AVOID DANGEROUS ACTIVITIES WHERE INJURY MAY OCCUR  BE CAREFUL WITH KNIVES AND OTHER SHARP OBJECTS MONITOR STOOLS AND URINE FOR EXCESSIVE BLOOD CALL THIS OFFICE IF YOU NOTICE ANY UNUSUAL BLEEDING (BLOOD IN STOOLS OR URINE, NOSEBLEEDING, BLEEDING GUMS, POPPED BLOOD VESSELS IN EYES, BLOOD IN SPUTUM OR VOMITING WITH BLOOD, OR ANY OTHER UNUSUAL BLEEDING)  Today you received the following chemotherapy agents: Rituxan, Vincristine, Cytoxan, Etoposide.  To help prevent nausea and vomiting after your treatment, we encourage you to take your nausea medication.   If you develop nausea and vomiting that is not controlled by your nausea medication, call the clinic.   BELOW ARE SYMPTOMS THAT SHOULD BE REPORTED IMMEDIATELY:  *FEVER GREATER THAN 100.5 F  *CHILLS WITH OR WITHOUT FEVER  NAUSEA AND VOMITING THAT IS NOT CONTROLLED WITH YOUR NAUSEA MEDICATION  *UNUSUAL SHORTNESS OF BREATH  *UNUSUAL BRUISING OR BLEEDING  TENDERNESS IN MOUTH AND THROAT WITH OR WITHOUT PRESENCE OF ULCERS  *URINARY PROBLEMS  *BOWEL PROBLEMS  UNUSUAL RASH Items with * indicate a potential emergency and should be followed up as soon as possible.  Feel free to call the clinic you have any questions or concerns. The clinic phone number is [phone].

## 2013-10-17 ENCOUNTER — Telehealth: Payer: Self-pay | Admitting: Internal Medicine

## 2013-10-17 NOTE — Telephone Encounter (Signed)
Pt aware of appt on march and APril  2015due to My chart

## 2013-10-19 ENCOUNTER — Ambulatory Visit (HOSPITAL_BASED_OUTPATIENT_CLINIC_OR_DEPARTMENT_OTHER): Payer: Medicare Other

## 2013-10-19 ENCOUNTER — Other Ambulatory Visit (HOSPITAL_BASED_OUTPATIENT_CLINIC_OR_DEPARTMENT_OTHER): Payer: Medicare Other

## 2013-10-19 ENCOUNTER — Other Ambulatory Visit: Payer: Self-pay | Admitting: Medical Oncology

## 2013-10-19 VITALS — BP 166/88 | HR 66 | Temp 98.1°F

## 2013-10-19 DIAGNOSIS — C8589 Other specified types of non-Hodgkin lymphoma, extranodal and solid organ sites: Secondary | ICD-10-CM | POA: Diagnosis not present

## 2013-10-19 DIAGNOSIS — Z5189 Encounter for other specified aftercare: Secondary | ICD-10-CM

## 2013-10-19 DIAGNOSIS — C8590 Non-Hodgkin lymphoma, unspecified, unspecified site: Secondary | ICD-10-CM

## 2013-10-19 LAB — CBC WITH DIFFERENTIAL/PLATELET
BASO%: 1.3 % (ref 0.0–2.0)
Basophils Absolute: 0.1 10*3/uL (ref 0.0–0.1)
EOS ABS: 0.1 10*3/uL (ref 0.0–0.5)
EOS%: 1.1 % (ref 0.0–7.0)
HEMATOCRIT: 28.8 % — AB (ref 34.8–46.6)
HEMOGLOBIN: 9.6 g/dL — AB (ref 11.6–15.9)
LYMPH%: 27.7 % (ref 14.0–49.7)
MCH: 33.2 pg (ref 25.1–34.0)
MCHC: 33.3 g/dL (ref 31.5–36.0)
MCV: 99.7 fL (ref 79.5–101.0)
MONO#: 0.4 10*3/uL (ref 0.1–0.9)
MONO%: 9.1 % (ref 0.0–14.0)
NEUT%: 60.8 % (ref 38.4–76.8)
NEUTROS ABS: 2.9 10*3/uL (ref 1.5–6.5)
PLATELETS: 73 10*3/uL — AB (ref 145–400)
RBC: 2.89 10*6/uL — ABNORMAL LOW (ref 3.70–5.45)
RDW: 16.9 % — ABNORMAL HIGH (ref 11.2–14.5)
WBC: 4.7 10*3/uL (ref 3.9–10.3)
lymph#: 1.3 10*3/uL (ref 0.9–3.3)
nRBC: 0 % (ref 0–0)

## 2013-10-19 MED ORDER — PEGFILGRASTIM INJECTION 6 MG/0.6ML
6.0000 mg | Freq: Once | SUBCUTANEOUS | Status: AC
  Administered 2013-10-19: 6 mg via SUBCUTANEOUS
  Filled 2013-10-19: qty 0.6

## 2013-10-21 ENCOUNTER — Other Ambulatory Visit: Payer: Self-pay | Admitting: Internal Medicine

## 2013-10-21 ENCOUNTER — Ambulatory Visit (HOSPITAL_BASED_OUTPATIENT_CLINIC_OR_DEPARTMENT_OTHER): Payer: Medicare Other | Admitting: Pharmacist

## 2013-10-21 ENCOUNTER — Other Ambulatory Visit (HOSPITAL_BASED_OUTPATIENT_CLINIC_OR_DEPARTMENT_OTHER): Payer: Medicare Other

## 2013-10-21 DIAGNOSIS — I2699 Other pulmonary embolism without acute cor pulmonale: Secondary | ICD-10-CM

## 2013-10-21 DIAGNOSIS — C8590 Non-Hodgkin lymphoma, unspecified, unspecified site: Secondary | ICD-10-CM

## 2013-10-21 DIAGNOSIS — C8589 Other specified types of non-Hodgkin lymphoma, extranodal and solid organ sites: Secondary | ICD-10-CM | POA: Diagnosis not present

## 2013-10-21 LAB — CBC WITH DIFFERENTIAL/PLATELET
BASO%: 0.2 % (ref 0.0–2.0)
Basophils Absolute: 0.1 10*3/uL (ref 0.0–0.1)
EOS ABS: 0.1 10*3/uL (ref 0.0–0.5)
EOS%: 0.2 % (ref 0.0–7.0)
HEMATOCRIT: 29.9 % — AB (ref 34.8–46.6)
HGB: 9.9 g/dL — ABNORMAL LOW (ref 11.6–15.9)
LYMPH%: 5.1 % — AB (ref 14.0–49.7)
MCH: 33.3 pg (ref 25.1–34.0)
MCHC: 33.1 g/dL (ref 31.5–36.0)
MCV: 100.7 fL (ref 79.5–101.0)
MONO#: 0.8 10*3/uL (ref 0.1–0.9)
MONO%: 1.6 % (ref 0.0–14.0)
NEUT#: 43.1 10*3/uL — ABNORMAL HIGH (ref 1.5–6.5)
NEUT%: 92.9 % — ABNORMAL HIGH (ref 38.4–76.8)
PLATELETS: 88 10*3/uL — AB (ref 145–400)
RBC: 2.97 10*6/uL — AB (ref 3.70–5.45)
RDW: 17 % — ABNORMAL HIGH (ref 11.2–14.5)
WBC: 46.4 10*3/uL — AB (ref 3.9–10.3)
lymph#: 2.4 10*3/uL (ref 0.9–3.3)
nRBC: 0 % (ref 0–0)

## 2013-10-21 LAB — PROTIME-INR
INR: 1.6 — ABNORMAL LOW (ref 2.00–3.50)
Protime: 19.2 Seconds — ABNORMAL HIGH (ref 10.6–13.4)

## 2013-10-21 LAB — POCT INR: INR: 1.6

## 2013-10-21 NOTE — Progress Notes (Addendum)
INR continues to be below goal of 2-3.  H/H=9.9/29.9  Plt=88000  No changes in medications.  Tonya Wise appetite has greatly improved over the past couple months and since we started seeing her in the coumadin clinic.  This may attribute to required higher doses of coumadin.  She is also taking a MVI with VitK.  Will increase coumadni dose by 20% to 6mg  daily.  Will check PT/INR in 1 week.  Coumadin 6mg  #20 samples given to Tonya Wise: [id number] Exp 03/2014

## 2013-10-22 ENCOUNTER — Other Ambulatory Visit: Payer: Medicare Other

## 2013-10-23 ENCOUNTER — Other Ambulatory Visit: Payer: Self-pay | Admitting: Internal Medicine

## 2013-10-23 ENCOUNTER — Other Ambulatory Visit: Payer: Medicare Other

## 2013-10-30 ENCOUNTER — Ambulatory Visit (HOSPITAL_COMMUNITY)
Admission: RE | Admit: 2013-10-30 | Discharge: 2013-10-30 | Disposition: A | Discharge status: Home / Self Care | Payer: Medicare Other | Source: Ambulatory Visit | Attending: Internal Medicine | Admitting: Internal Medicine

## 2013-10-30 ENCOUNTER — Other Ambulatory Visit: Payer: Self-pay

## 2013-10-30 ENCOUNTER — Other Ambulatory Visit (HOSPITAL_BASED_OUTPATIENT_CLINIC_OR_DEPARTMENT_OTHER): Payer: Medicare Other

## 2013-10-30 ENCOUNTER — Ambulatory Visit (HOSPITAL_BASED_OUTPATIENT_CLINIC_OR_DEPARTMENT_OTHER): Payer: Medicare Other | Admitting: Pharmacist

## 2013-10-30 DIAGNOSIS — R161 Splenomegaly, not elsewhere classified: Secondary | ICD-10-CM | POA: Insufficient documentation

## 2013-10-30 DIAGNOSIS — C8589 Other specified types of non-Hodgkin lymphoma, extranodal and solid organ sites: Secondary | ICD-10-CM | POA: Insufficient documentation

## 2013-10-30 DIAGNOSIS — C8590 Non-Hodgkin lymphoma, unspecified, unspecified site: Secondary | ICD-10-CM

## 2013-10-30 DIAGNOSIS — I2699 Other pulmonary embolism without acute cor pulmonale: Secondary | ICD-10-CM

## 2013-10-30 LAB — PROTIME-INR
INR: 2.5 (ref 2.00–3.50)
Protime: 30 Seconds — ABNORMAL HIGH (ref 10.6–13.4)

## 2013-10-30 LAB — POCT INR: INR: 2.5

## 2013-10-30 LAB — GLUCOSE, CAPILLARY: Glucose-Capillary: 102 mg/dL — ABNORMAL HIGH (ref 70–99)

## 2013-10-30 MED ORDER — FLUDEOXYGLUCOSE F - 18 (FDG) INJECTION
6.7000 | Freq: Once | INTRAVENOUS | Status: AC | PRN
  Administered 2013-10-30: 6.7 via INTRAVENOUS

## 2013-10-30 MED ORDER — NYSTATIN 100000 UNIT/ML MT SUSP: OROMUCOSAL | Status: DC

## 2013-10-30 NOTE — Patient Instructions (Signed)
INR at goal  No changes Continue Coumadin to 6mg  daily. Recheck INR on 11/05/13 at 11am for lab and 11:30am with Dr. Juliann Mule and 12pm for Coumadin clinic

## 2013-10-30 NOTE — Progress Notes (Signed)
INR at goal today after slight dose increase Patient is doing well with no complaints Appetite continues to be better No reports of unusual bleeding or bruising (she has backed off on "aggressive" flossing" No missed or extra doses No new or changed medications Ms. Musgrave is scheduled for a PET scan later this morning and she is anxious for this. Plan: No changes Continue Coumadin to 6mg  daily. Recheck INR on 11/05/13 at 11am for lab and 11:30am with Dr. Juliann Mule and 12pm for Coumadin clinic

## 2013-11-02 ENCOUNTER — Encounter: Payer: Self-pay | Admitting: Cardiovascular Disease

## 2013-11-02 NOTE — Progress Notes (Addendum)
Patient ID: ANALYSSA DOWNS, female   DOB: 05-Nov-1940, 73 y.o.   MRN: [id number]      HPI: Ms. Kyasia Steuck is a 73 year old female who was initially referred to me through the courtesy of Dr. Redmond School for evaluation of recent increased heart rate in this patient status post documentation of recent pulmonary embolism. She was seen initially on 06/19/2013 and in follow-up on 08/12/2013.  Ms. Ficken is a 73 year old female without known prior cardiac history. She developed anemia and dehydration this past summer and ultimately was diagnosed as having non-Hodgkin's lymphoma. She received 2 treatments of chemotherapy. On 06/16/2013 she underwent a followup CT scan which showed slight interval reduction in axillary lymphadenopathy but there was a 1 small residual right axillary lymph node measuring 16 a 12 mm. She also is noted a significant reduction in mediastinal and hilar adenopathy. However, it was noted that she had a left-sided pulmonary embolism. There also was resolution of bulky abdominal lymphadenopathy as well as interval decrease in the size of her spleen since initiating chemotherapy. Because of the diagnosis of left-sided pulmonary embolism she apparently started therapy with combination Lovenox as well as oral Coumadin. She apparently was seen in Dr. Lanice Shirts office on 06/17/2013 with complaints of increasing cough that has become productive. She was started on Zithromax for possible acute bronchitis. She was tachycardic and did have T-wave changes.   A 2-D echo Doppler study in September 2014 while she was hospitalized at [facility] demonstrated an ejection fraction was 50-55%. She had pulmonary hypertension with estimated RV systolic pressure at 39 mm and Grade 1 diastolic dysfunction.  When I initially saw her, I  recommend that she undergo a nuclear perfusion study because of some  chest discomfort. Her nuclear perfusion study demonstrated normal perfusion, however ejection fraction  was interpreted at 42%. Subsequently, a followup echo Doppler study was recommended and also discuss this with her oncologist, Dr. Juliann Mule. Her followup echo Doppler study on 07/16/2013 confirmed LV dysfunction and ejection fraction was now 35-40%. There was diffuse hypokinesis. She now had grade 2 diastolic dysfunction. Due to her reduction of LV function, subsequent chemotherapyhas been at a reduced dose. She completed cycle 5 of mini-R-CEO. She does have thrombocytopenia and when last seen by Dr. Juliann Mule her platelets were 59,000. She is scheduled to undergo an additional week of recovery prior to undergoing a next course of chemotherapy.  She does complain of a cough which has somewhat improved.. She denies any awareness of wheezing. She denies any further episodes of tachypalpitations. There is no PND or orthopnea.  She tells me she will have a followup PET scan in several weeks for possible bone marrow biopsy.  She presents for evaluation.  Past Medical History  Diagnosis Date  . Hypertension   . Renal insufficiency   . Osteopenia   . Allergy   . Heart murmur   . GERD (gastroesophageal reflux disease)   . Anemia   . Hypercalcemia 03/27/2013  . Non-Hodgkins lymphoma 04/06/2013  . Diastolic dysfunction, grade I 04/12/2013    Past Surgical History  Procedure Laterality Date  . Appendectomy    . Colonoscopy  2008  . Spine surgery    . Back surgery      LOWER BACK TWICE  . Abdominal hysterectomy    . Esophagogastroduodenoscopy N/A 03/29/2013    Procedure: ESOPHAGOGASTRODUODENOSCOPY (EGD);  Surgeon: Juanita Craver, MD;  Location: [facility] ENDOSCOPY;  Service: Endoscopy;  Laterality: N/A;  . Inguinal lymph node biopsy Right 03/31/2013  Procedure: INGUINAL LYMPH NODE BIOPSY;  Surgeon: Gwenyth Ober, MD;  Location: [place];  Service: General;  Laterality: Right;    Allergies  Allergen Reactions  . Penicillins Rash    Current Outpatient Prescriptions  Medication Sig Dispense Refill  . albuterol  (PROVENTIL) (5 MG/ML) 0.5% nebulizer solution Take 0.5 mLs (2.5 mg total) by nebulization every 4 (four) hours as needed for wheezing or shortness of breath.  20 mL  12  . furosemide (LASIX) 20 MG tablet Take 1 tablet (20 mg total) by mouth daily.  30 tablet  3  . HYDROcodone-homatropine (HYCODAN) 5-1.5 MG/5ML syrup Take 5 mLs by mouth every 6 (six) hours as needed for cough.  240 mL  0  . lidocaine-prilocaine (EMLA) cream Apply 1 application topically as needed. Apply to port site one hour before treatment and cover with plastic wrap.  30 g  3  . losartan (COZAAR) 25 MG tablet Take 1 tablet (25 mg total) by mouth 2 (two) times daily.  180 tablet  3  . metoprolol tartrate (LOPRESSOR) 25 MG tablet Take 1 tablet (25 mg total) by mouth 2 (two) times daily.  180 tablet  3  . mirtazapine (REMERON) 30 MG tablet Take 1 tablet by mouth daily as needed.      . Multiple Vitamins-Minerals (MULTIVITAMIN WITH MINERALS) tablet Take 1 tablet by mouth daily.      Marland Kitchen omeprazole (PRILOSEC) 40 MG capsule Take 1 capsule by mouth daily.      . ondansetron (ZOFRAN) 4 MG tablet Take 1 tablet (4 mg total) by mouth every 6 (six) hours as needed for nausea.  20 tablet  0  . potassium chloride (KLOR-CON M10) 10 MEQ tablet Take 1 tablet (10 mEq total) by mouth daily.  30 tablet  0  . warfarin (COUMADIN) 2 MG tablet Take 2 mg by mouth daily. Monitored by [facility] coumadin clinic      . nystatin (MYCOSTATIN) 100000 UNIT/ML suspension SWISH AND SWALLOW OR SPIT 5 MILLILITERS 4 TIMES DAILY AS NEEDED  473 mL  0  . valACYclovir (VALTREX) 500 MG tablet TAKE 1 TABLET BY MOUTH DAILY  60 tablet  1   No current facility-administered medications for this visit.    Social history is notable in that she is married for 54 years. 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 She has 2 children and 2 grandchildren. No tobacco use presently. She quit remotely in 1962. There is no alcohol use. She does not routinely exercise.  Family  History  Problem Relation Age of Onset  . Heart attack Father 90  . Leukemia Mother   . Diabetes Brother   . Diabetes Sister     ROS is negative for fever chills or night sweats. She denies skin rash. She denies visual changes. There is no change in hearing. 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 She continues to have a  Cough, but this is improed.Marland Kitchen She was diagnosed with a left  Pulmonary embolism and recent CT was also shown reduction in previous adenopathy. She doesn't a mild shortness of breath with activity. There is no chest tightness. She denies abdominal complaints presently. She denies blood in her stool or urine. There is no claudication. Times his transient leg swelling. There is no cold or heat intolerance. She does have a prescription for albuterol which she takes when necessary wheezing. She does have protein calorie malnutrition for which he takes Ensure complete feeding supplement. She denies on any bone  discomfort. At times she does have intermittent back discomfort. Other comprehensive 14 point system review is negative.  PE BP 154/90  Pulse 80  Ht '5\' 3"'  (1.6 m)  Wt 125 lb (56.7 kg)  BMI 22.15 kg/m2 General: Alert, oriented, no distress.  Skin: normal turgor, no rashes HEENT: Normocephalic, atraumatic. Pupils round and reactive; sclera anicteric; Fundi without hemorrhages or exudates. No papilledema Nose without nasal septal hypertrophy 2 Mouth/Parynx benign; Mallinpatti scale 2 Neck: No JVD, no carotid bruits Chest wall: No tenderness to palpation Lungs: clear to ausculatation and percussion; no wheezing or rales Heart: RRR, s1 s2 normal 1/6 systolic murmur; no diastolic murmur. No S3 or S4 gallop. Abdomen: soft, nontender; no hepatosplenomehaly, BS+; abdominal aorta nontender and not dilated by palpation. Back: No CVA tenderness Pulses 2+ Extremities: no clubbinbg cyanosis or edema, Homan's sign negative  Neurologic: grossly nonfocal Psychologic: Normal  mood and affect  ECG: Normal sinus rhythm at 80 beats per minute with nonspecific T changes diffusely  ECG: I did review the EKG taken 06/17/2013 at Dr. Royetta Car office. That EKG showed sinus tachycardia 117 beats per minute with inferolateral and anterolateral T wave abnormalities. EKG at  office visit in taken in our November shows sinus rhythm 87 beats per minute. She does have slightly more pronounced T wave inversion in leads II, III, and F the 3 through V6.  LABS:  BMET    Component Value Date/Time   NA 144 10/16/2013 0859   NA 139 08/26/2013 0945   K 4.5 10/16/2013 0859   K 3.1* 08/26/2013 0945   CL 101 08/26/2013 0945   CO2 22 10/16/2013 0859   CO2 27 08/26/2013 0945   GLUCOSE 105 10/16/2013 0859   GLUCOSE 120* 08/26/2013 0945   BUN 22.7 10/16/2013 0859   BUN 8 08/26/2013 0945   CREATININE 1.0 10/16/2013 0859   CREATININE 0.81 08/26/2013 0945   CREATININE 0.85 07/17/2013 0834   CALCIUM 9.3 10/16/2013 0859   CALCIUM 8.5 08/26/2013 0945   GFRNONAA 71* 08/26/2013 0945   GFRAA 82* 08/26/2013 0945     Hepatic Function Panel     Component Value Date/Time   PROT 5.5* 10/16/2013 0859   PROT 5.3* 08/21/2013 1535   ALBUMIN 3.5 10/16/2013 0859   ALBUMIN 3.0* 08/21/2013 1535   AST 28 10/16/2013 0859   AST 20 08/21/2013 1535   ALT 19 10/16/2013 0859   ALT 8 08/21/2013 1535   ALKPHOS 131 10/16/2013 0859   ALKPHOS 59 08/21/2013 1535   BILITOT 0.76 10/16/2013 0859   BILITOT 0.6 08/21/2013 1535     CBC    Component Value Date/Time   WBC 46.4* 10/21/2013 0956   WBC 9.9 08/26/2013 0945   RBC 2.97* 10/21/2013 0956   RBC 3.30* 08/26/2013 0945   HGB 9.9* 10/21/2013 0956   HGB 10.1* 08/26/2013 0945   HCT 29.9* 10/21/2013 0956   HCT 29.7* 08/26/2013 0945   PLT 88* 10/21/2013 0956   PLT 55* 08/26/2013 0945   MCV 100.7 10/21/2013 0956   MCV 90.0 08/26/2013 0945   MCH 33.3 10/21/2013 0956   MCH 30.6 08/26/2013 0945   MCHC 33.1 10/21/2013 0956   MCHC 34.0 08/26/2013 0945   RDW 17.0* 10/21/2013 0956   RDW 15.4  08/26/2013 0945   LYMPHSABS 2.4 10/21/2013 0956   LYMPHSABS 0.4* 08/24/2013 1240   MONOABS 0.8 10/21/2013 0956   MONOABS 0.1 08/24/2013 1240   EOSABS 0.1 10/21/2013 0956   EOSABS 0.1 08/24/2013 1240   BASOSABS 0.1  10/21/2013 0956   BASOSABS 0.0 08/24/2013 1240     BNP    Component Value Date/Time   PROBNP 21795.0* 08/21/2013 1905    Lipid Panel     Component Value Date/Time   CHOL 234* 07/17/2013 0834     RADIOLOGY: Ct Chest W Contrast  06/16/2013   CLINICAL DATA:  Non-Hodgkin's lymphoma.  EXAM: CT CHEST, ABDOMEN, AND PELVIS WITH CONTRAST  TECHNIQUE: Multidetector CT imaging of the chest, abdomen and pelvis was performed following the standard protocol during bolus administration of intravenous contrast.  CONTRAST:  129m OMNIPAQUE IOHEXOL 300 MG/ML  SOLN  COMPARISON:  03/30/2013.  FINDINGS: CT CHEST FINDINGS  The chest wall demonstrates a right-sided Port-A-Cath in good position without complicating features. Bilateral breast prosthesis are noted. The bulky bilateral axillary lymphadenopathy has resolved. There is a small residual right axillary node on image number 12 which measures 16 x 12 mm. It previously measured 27 x 20 mm. It did show slight hypermetabolism on the PET scan. Other normal size axillary lymph nodes did not show any hyper metabolism.  The bony thorax is intact. No destructive bone lesions or spinal canal compromise.  The heart is normal in size. No pericardial effusion. No mediastinal or hilar mass. The bulky lymphadenopathy seen on the prior study has significantly decreased in size. The subcarinal nodal mass previously measured 4.0 x 2.2 cm and now measures 2.0 x 1.3 cm. The aorta is normal in caliber. Stable atherosclerotic calcifications. There is clot noted in the left lower lobe pulmonary artery consistent with pulmonary embolism. There is also a small amount of string like adherent clot in the main left pulmonary artery.  Examination of the lung parenchyma demonstrates  no worrisome pulmonary nodules. Stable area of subpleural scarring type changes in the right middle lobe. There is a small right pleural effusion with overlying atelectasis.  CT ABDOMEN AND PELVIS FINDINGS  The liver demonstrates diffuse fatty infiltration but no focal hepatic lesions or biliary dilatation. The gallbladder is normal. No common bowel duct dilatation. The pancreas is normal. The spleen remains mildly enlarged and there are multiple low-attenuation lesions. The adrenal glands and kidneys are unremarkable and unchanged.  The stomach, duodenum, small bowel and colon are unremarkable. No inflammatory changes, mass lesions or obstructive findings. There are small scattered mesenteric and retroperitoneal lymph nodes. This reflects a significant improvement since the prior CT scan where there was bulky adenopathy. The aorta and branch vessels are patent. Stable atherosclerotic calcifications.  The uterus and ovaries are unremarkable and stable. The bladder is normal. No pelvic mass, adenopathy or free pelvic fluid collections. No inguinal mass or adenopathy. The borderline inguinal lymph nodes have resolved.  No significant bony findings.  Stable lumbar fusion changes.  IMPRESSION: 1. Significant interval reduction in axillary lymphadenopathy. There is 1 small residual right axillary lymph node measuring 16 x 12 mm which did show FDG uptake. 2. Significant reduction in mediastinal and hilar lymphadenopathy. 3. Left-sided pulmonary embolism. 4. Resolution of bulky abdominal lymphadenopathy. 5. Interval decrease in size of the spleen (15 x 14 x 10.5 versus 12 x 12 x 9.5). Persistent small lesions.   Electronically Signed   By: MKalman JewelsM.D.   On: 06/16/2013 15:11   Ct Abdomen Pelvis W Contrast  06/16/2013   CLINICAL DATA:  Non-Hodgkin's lymphoma.  EXAM: CT CHEST, ABDOMEN, AND PELVIS WITH CONTRAST  TECHNIQUE: Multidetector CT imaging of the chest, abdomen and pelvis was performed following the  standard protocol during bolus administration of intravenous  contrast.  CONTRAST:  184m OMNIPAQUE IOHEXOL 300 MG/ML  SOLN  COMPARISON:  03/30/2013.  FINDINGS: CT CHEST FINDINGS  The chest wall demonstrates a right-sided Port-A-Cath in good position without complicating features. Bilateral breast prosthesis are noted. The bulky bilateral axillary lymphadenopathy has resolved. There is a small residual right axillary node on image number 12 which measures 16 x 12 mm. It previously measured 27 x 20 mm. It did show slight hypermetabolism on the PET scan. Other normal size axillary lymph nodes did not show any hyper metabolism.  The bony thorax is intact. No destructive bone lesions or spinal canal compromise.  The heart is normal in size. No pericardial effusion. No mediastinal or hilar mass. The bulky lymphadenopathy seen on the prior study has significantly decreased in size. The subcarinal nodal mass previously measured 4.0 x 2.2 cm and now measures 2.0 x 1.3 cm. The aorta is normal in caliber. Stable atherosclerotic calcifications. There is clot noted in the left lower lobe pulmonary artery consistent with pulmonary embolism. There is also a small amount of string like adherent clot in the main left pulmonary artery.  Examination of the lung parenchyma demonstrates no worrisome pulmonary nodules. Stable area of subpleural scarring type changes in the right middle lobe. There is a small right pleural effusion with overlying atelectasis.  CT ABDOMEN AND PELVIS FINDINGS  The liver demonstrates diffuse fatty infiltration but no focal hepatic lesions or biliary dilatation. The gallbladder is normal. No common bowel duct dilatation. The pancreas is normal. The spleen remains mildly enlarged and there are multiple low-attenuation lesions. The adrenal glands and kidneys are unremarkable and unchanged.  The stomach, duodenum, small bowel and colon are unremarkable. No inflammatory changes, mass lesions or obstructive  findings. There are small scattered mesenteric and retroperitoneal lymph nodes. This reflects a significant improvement since the prior CT scan where there was bulky adenopathy. The aorta and branch vessels are patent. Stable atherosclerotic calcifications.  The uterus and ovaries are unremarkable and stable. The bladder is normal. No pelvic mass, adenopathy or free pelvic fluid collections. No inguinal mass or adenopathy. The borderline inguinal lymph nodes have resolved.  No significant bony findings.  Stable lumbar fusion changes.  IMPRESSION: 1. Significant interval reduction in axillary lymphadenopathy. There is 1 small residual right axillary lymph node measuring 16 x 12 mm which did show FDG uptake. 2. Significant reduction in mediastinal and hilar lymphadenopathy. 3. Left-sided pulmonary embolism. 4. Resolution of bulky abdominal lymphadenopathy. 5. Interval decrease in size of the spleen (15 x 14 x 10.5 versus 12 x 12 x 9.5). Persistent small lesions.   Electronically Signed   By: MKalman JewelsM.D.   On: 06/16/2013 15:11   Nm Pet Image Restag (ps) Skull Base To Thigh  06/16/2013   CLINICAL DATA:  Subsequent treatment strategy for non-Hodgkin's lymphoma.  EXAM: NUCLEAR MEDICINE PET SKULL BASE TO THIGH  FASTING BLOOD GLUCOSE:  Value: 1057mdl  TECHNIQUE: 19.0 mCi F-18 FDG was injected intravenously. CT data was obtained and used for attenuation correction and anatomic localization only. (This was not acquired as a diagnostic CT examination.) Additional exam technical data entered on technologist worksheet.  COMPARISON:  CT scan 03/30/2013.  FINDINGS: NECK  No hypermetabolic lymph nodes in the neck.  CHEST  There is a single right axillary lymph node measuring 16 x 10 mm in demonstrates FDG uptake with SUV max of 5.5. This would suggest some residual metabolically active lymphoma. The mediastinal and hilar lymph nodes are much smaller than a  previous chest CT and I do not see any definite metabolic  activity. No worrisome pulmonary lesions. The right middle lobe subpleural density is relatively stable and is likely scarring or atelectasis. No abnormal FDG uptake is identified. There is a small right pleural effusion.  ABDOMEN/PELVIS  No persistence/residual lymphadenopathy in the abdomen. A few small scattered lymph nodes are noted but the bulky adenopathy has resolved. No metabolic activity is identified in the remaining small lymph nodes. Stable mild splenomegaly with numerous small lesions but no hyper metabolism.  SKELETON  Diffuse osseous uptake is likely due to rebound marrow activity or marrow stimulating drugs.  IMPRESSION: 1. Single metabolically active 16 x 10 mm in the right axilla. 2. Persistent but definitely smaller mediastinal and hilar lymph node without definite metabolic activity. 3. Resolution of abdominal adenopathy with some scattered residual lymph nodes but no metabolic activity. 4. Persistent mild splenomegaly and small splenic lesions without obvious hyper metabolism.   Electronically Signed   By: Kalman Jewels M.D.   On: 06/16/2013 14:38     ASSESSMENT AND PLAN: Ms. Canino is a 73 year old female with non-Hodgkin's lymphoma, stage lvB and initially has been demonstrated to have some improvement in her prior more significant adenopathy. An echo Doppler study  in September  showed normal systolic function with diastolic dysfunction. Her BMP was elevated at that time suggesting congestive heart failure. She was diagnosed with a pulmonary embolism in November and has been on anticoagulation. She did receive several courses of chemotherapy. Subsequent LV function has decreased. Her nuclear perfusion study revealed an ejection fraction of 42%. She had probable breast attenuation artifact without significant ischemia and was interpreted as low risk. Her echo Doppler study showed an ejection fraction of 35-40% with diffuse hypokinesis and grade 2 diastolic dysfunction. When I last saw  her, because of her chronic cough I discontinued her ACE inhibitor which I felt may be contributing to her cough. I started her on losartan initially at 25 mg and then titrated this to twice a day. She has tolerated this well. Her cough has improved. She has had difficulty with thrombocytopenia and counts. She is to see Dr. Juliann Mule with plans for followup cycle of her chemotherapy. She has not had any signs of CHF on exam today.  I am slowly further titrate her beta blocker regimen to 37.5 mg twice a day of metoprolol tartrate. I am recommending that a followup echo Doppler study be obtained in August to reassess her LV function 9 months after her last echo from December 2014. I will see her back in followup and further recommendations will be made at that time.  Troy Sine, MD, [facility] 11/02/2013 8:56 PM

## 2013-11-05 ENCOUNTER — Encounter: Payer: Self-pay | Admitting: Internal Medicine

## 2013-11-05 ENCOUNTER — Other Ambulatory Visit (HOSPITAL_BASED_OUTPATIENT_CLINIC_OR_DEPARTMENT_OTHER): Payer: Medicare Other

## 2013-11-05 ENCOUNTER — Telehealth: Payer: Self-pay | Admitting: Internal Medicine

## 2013-11-05 ENCOUNTER — Ambulatory Visit (HOSPITAL_BASED_OUTPATIENT_CLINIC_OR_DEPARTMENT_OTHER): Payer: Medicare Other | Admitting: Internal Medicine

## 2013-11-05 ENCOUNTER — Ambulatory Visit (HOSPITAL_BASED_OUTPATIENT_CLINIC_OR_DEPARTMENT_OTHER): Payer: Medicare Other | Admitting: Pharmacist

## 2013-11-05 ENCOUNTER — Other Ambulatory Visit: Payer: Self-pay

## 2013-11-05 VITALS — BP 148/80 | HR 77 | Temp 98.0°F | Resp 18 | Ht 63.0 in | Wt 122.0 lb

## 2013-11-05 DIAGNOSIS — C8589 Other specified types of non-Hodgkin lymphoma, extranodal and solid organ sites: Secondary | ICD-10-CM

## 2013-11-05 DIAGNOSIS — C8590 Non-Hodgkin lymphoma, unspecified, unspecified site: Secondary | ICD-10-CM

## 2013-11-05 DIAGNOSIS — D6481 Anemia due to antineoplastic chemotherapy: Secondary | ICD-10-CM

## 2013-11-05 DIAGNOSIS — I509 Heart failure, unspecified: Secondary | ICD-10-CM

## 2013-11-05 DIAGNOSIS — D6959 Other secondary thrombocytopenia: Secondary | ICD-10-CM | POA: Diagnosis not present

## 2013-11-05 DIAGNOSIS — I5040 Unspecified combined systolic (congestive) and diastolic (congestive) heart failure: Secondary | ICD-10-CM | POA: Diagnosis not present

## 2013-11-05 DIAGNOSIS — I2699 Other pulmonary embolism without acute cor pulmonale: Secondary | ICD-10-CM

## 2013-11-05 DIAGNOSIS — T451X5A Adverse effect of antineoplastic and immunosuppressive drugs, initial encounter: Secondary | ICD-10-CM | POA: Diagnosis not present

## 2013-11-05 LAB — CBC WITH DIFFERENTIAL/PLATELET
BASO%: 0.6 % (ref 0.0–2.0)
Basophils Absolute: 0 10*3/uL (ref 0.0–0.1)
EOS%: 1 % (ref 0.0–7.0)
Eosinophils Absolute: 0.1 10*3/uL (ref 0.0–0.5)
HCT: 31.6 % — ABNORMAL LOW (ref 34.8–46.6)
HGB: 10.5 g/dL — ABNORMAL LOW (ref 11.6–15.9)
LYMPH%: 24.3 % (ref 14.0–49.7)
MCH: 34.2 pg — AB (ref 25.1–34.0)
MCHC: 33.2 g/dL (ref 31.5–36.0)
MCV: 102.9 fL — AB (ref 79.5–101.0)
MONO#: 0.7 10*3/uL (ref 0.1–0.9)
MONO%: 13.8 % (ref 0.0–14.0)
NEUT%: 60.3 % (ref 38.4–76.8)
NEUTROS ABS: 3 10*3/uL (ref 1.5–6.5)
Platelets: 54 10*3/uL — ABNORMAL LOW (ref 145–400)
RBC: 3.07 10*6/uL — AB (ref 3.70–5.45)
RDW: 16.4 % — AB (ref 11.2–14.5)
WBC: 4.9 10*3/uL (ref 3.9–10.3)
lymph#: 1.2 10*3/uL (ref 0.9–3.3)

## 2013-11-05 LAB — COMPREHENSIVE METABOLIC PANEL (CC13)
ALK PHOS: 115 U/L (ref 40–150)
ALT: 17 U/L (ref 0–55)
AST: 26 U/L (ref 5–34)
Albumin: 3.9 g/dL (ref 3.5–5.0)
Anion Gap: 7 mEq/L (ref 3–11)
BUN: 21.5 mg/dL (ref 7.0–26.0)
CO2: 26 mEq/L (ref 22–29)
Calcium: 9.3 mg/dL (ref 8.4–10.4)
Chloride: 111 mEq/L — ABNORMAL HIGH (ref 98–109)
Creatinine: 1.1 mg/dL (ref 0.6–1.1)
Glucose: 59 mg/dl — ABNORMAL LOW (ref 70–140)
Potassium: 3.9 mEq/L (ref 3.5–5.1)
SODIUM: 144 meq/L (ref 136–145)
TOTAL PROTEIN: 5.6 g/dL — AB (ref 6.4–8.3)
Total Bilirubin: 0.52 mg/dL (ref 0.20–1.20)

## 2013-11-05 LAB — POCT INR: INR: 2.1

## 2013-11-05 LAB — PROTIME-INR
INR: 2.1 (ref 2.00–3.50)
PROTIME: 25.2 s — AB (ref 10.6–13.4)

## 2013-11-05 LAB — LACTATE DEHYDROGENASE (CC13): LDH: 254 U/L — ABNORMAL HIGH (ref 125–245)

## 2013-11-05 NOTE — Telephone Encounter (Signed)
Gave pt appt for lab and md for may 2015

## 2013-11-05 NOTE — Progress Notes (Signed)
[place] NOTE  Tonya Haste, MD [place] [address]  DIAGNOSIS: Non-Hodgkins lymphoma - Plan: CBC with Differential, Comprehensive metabolic panel (Cmet) - [facility], Lactate dehydrogenase (LDH) - [facility], Protime-INR, CBC with Differential, CT Biopsy, CANCELED: CT Biopsy  Other pulmonary embolism and infarction - Plan: Protime-INR  Chief Complaint  Patient presents with  . Lymphoma      Non-Hodgkins lymphoma   03/27/2013 - 03/31/2013 Hospital Admission Admitted to [facility] for profound anemia (Hgb 6.4).  C/o fevers, drenching nightsweats, weight lost.  Negative colonoscopy and EGD.  CT of C/A/P demonstrated significant lymphadenopathy suspcious of ympha.     03/31/2013 Initial Diagnosis Biopsy of R inguinal lymph node revealed Non-Hodgkins lymphoma. Diffuse Large B-cell Lymphoma   04/10/2013 - 04/24/2013 Hospital Admission Hypercalcemia, renal failure and dehydration.  Started min-RCHOP.    04/15/2013 Bone Marrow Biopsy Involvement with Diffuse Large B-cell Lymphoma. Stage IV NHL w B-symptoms.    04/18/2013 - 04/18/2013 Chemotherapy Mini R-CHOP Cycle #1 on 04/18/2013. Dose reduced to poor nutrion. It consisted on Cytoxan 400 mg/m2,Doxorubicin 25 mg/m2, Rituximab 375 mg/m2, Vincristine 69m.  Prednisone 100 mg daily for 9/13 - 9/16.   Received neupogen on 9/21, 9/27 -9/30.     04/25/2013 - 04/29/2013 Hospital Admission Admitted due to worsening mouth sores/oral thrush, odynophagia, failure to thrive.  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  Discharge to [facility](Dr. CGwynneth Munson and discharged to home on 05/19/2013.     06/01/2013 - 06/01/2013 Chemotherapy Mini R-CHO #2 dosed as above.  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  Received neulasta shot on 06/02/13   06/16/2013 Cancer Staging Re-staging PET/CT demonstrated significant interval reduction in axillary  lymphadenopathy.  There was 1 small residual right axillary lymph node measuring 16 x 12 mm which showed FDG uptake.  Signifcant reduction in mediastinal and hilar lymphadenopathy.    06/16/2013 Imaging CT of chest noted left-sided pulmonary embolim. Started on lovenox to coumadin bridge.     06/22/2013 - 06/22/2013 Chemotherapy Mini-R-CHO #3.  Received Neulasta on 06/23/2013.     07/16/2013 Imaging 2-D Echocardiogram demonstrates 35-45% EF with Grade 2 diastolic dysfunction.  Doxorubicin discontinued with consultation by cardiology (Dr. KClaiborne Billings.     08/19/2013 - 08/21/2013 Chemotherapy Mini-R-CEO #4.  Etoposide 25 mg/m2 instead of doxo due to above.  Etoposide 577mbid on day 2/3.  On day 3 (08/21/2013) she received 1 of two oral doses of ectoposide.    08/21/2013 - 08/26/2013 Hospital Ad[facility] and had influenzae A and pneumonia. Completed tamiflu and oral antibiotics.    09/25/2013 - 09/27/2013 Chemotherapy Mini-R-CEO #5. Etoposide 25 mg/m2 25 mg bid on days 2/3.    10/16/2013 - 10/19/2013 Chemotherapy Mini-R-CEO # 6. Etoposide 25 mg/m2 bid on days 2/3. Neulasta on day#4.    11/05/2013 Imaging Interval resolution of the hypermetabolism identified within the enlarged right axillary lymph node seen previously. This lymph node has also decreased markedly in size in the interval. No new hypermetabolic lesions in the neck, chest, abdomen, or  pelvis.    INTERVAL HISTORY: Tonya THACKSTON73.o. female with the above is here for follow-up.  She completed cycle #6 of mini-R-CEO.  She denies any fevers.  She reports that her appetite has improved.  She complains of mild tingling of her tongue making it hard to tolerate some foods especially sweets.   She presents along with her  husband Tonya Wise and her daughter. She reports improvement in her cough. She does report occasional abdominal discomfort described as a banding pattern not relieved to her bowel movements.  She  denies melena or hematochezia.  She is being followed by the coumadin clinic. She has seen her Cardiologist Dr. Claiborne Billings with an increase in her metoprolol.   MEDICAL HISTORY: Past Medical History  Diagnosis Date  . Hypertension   . Renal insufficiency   . Osteopenia   . Allergy   . Heart murmur   . GERD (gastroesophageal reflux disease)   . Anemia   . Hypercalcemia 03/27/2013  . Non-Hodgkins lymphoma 04/06/2013  . Diastolic dysfunction, grade I 04/12/2013    INTERIM HISTORY: has Hypercalcemia; Non-Hodgkins lymphoma; Elevated LFTs; Protein-calorie malnutrition, severe; Anemia of chronic disease; Acute on chronic combined systolic and diastolic heart failure; Other pulmonary embolism and infarction; Acute on chronic cough; Dysphagia, unspecified(787.20); Cardiomyopathy; Diastolic dysfunction grade 2; PNA (pneumonia); Influenza with pneumonia; Thrombocytopenia, unspecified; Acute respiratory failure with hypoxia secondary to influenza A with pneumonia; UTI (urinary tract infection); History of pulmonary embolism; HTN (hypertension); Hypokalemia; and Thrush on her problem list.    ALLERGIES:  is allergic to penicillins.  MEDICATIONS: has a current medication list which includes the following prescription(s): lidocaine-prilocaine, losartan, metoprolol tartrate, multivitamin with minerals, omeprazole, potassium chloride, valacyclovir, warfarin, albuterol, and hydrocodone-homatropine.  SURGICAL HISTORY:  Past Surgical History  Procedure Laterality Date  . Appendectomy    . Colonoscopy  2008  . Spine surgery    . Back surgery      LOWER BACK TWICE  . Abdominal hysterectomy    . Esophagogastroduodenoscopy N/A 03/29/2013    Procedure: ESOPHAGOGASTRODUODENOSCOPY (EGD);  Surgeon: Juanita Craver, MD;  Location: [facility] ENDOSCOPY;  Service: Endoscopy;  Laterality: N/A;  . Inguinal lymph node biopsy Right 03/31/2013    Procedure: INGUINAL LYMPH NODE BIOPSY;  Surgeon: Gwenyth Ober, MD;  Location: [place];   Service: General;  Laterality: Right;    REVIEW OF SYSTEMS:   Constitutional: Denies fevers, chills; She continues to have decreased in her taste.  Eyes: Denies blurriness of vision Ears, nose, mouth, throat, and face: Reports mild mucositis but denies a sore throat Respiratory: Denies cough, dyspnea or wheezes Cardiovascular: Denies palpitation, chest discomfort or lower extremity swelling Gastrointestinal:  Denies nausea, heartburn or change in bowel habits; abdominal discomfort as noted above.  Skin: Denies abnormal skin rashes Lymphatics: Denies new lymphadenopathy or easy bruising Neurological:Denies numbness, tingling or new weaknesses Behavioral/Psych: Mood is stable, no new changes  All other systems were reviewed with the patient and are negative.  PHYSICAL EXAMINATION: ECOG PERFORMANCE STATUS: 0 - Asymptomatic  Blood pressure 148/80, pulse 77, temperature 98 F (36.7 C), temperature source Oral, resp. rate 18, height '5\' 3"'  (1.6 m), weight 122 lb (55.339 kg). (weight 124 lbs on last visit)  GENERAL:alert, no distress and comfortable; easily ambulatory.  SKIN: skin color, texture, turgor are normal, no rashes or significant lesions.  EYES: normal, Conjunctiva are pink and non-injected, sclera clear OROPHARYNX:no exudate, no erythema and lips, buccal mucosa, and tongue with smooth surface suggestive of recovering mucositis.    NECK: supple, thyroid normal size, non-tender, without nodularity LYMPH:  no palpable lymphadenopathy in the cervical, axillary or supraclavicular LUNGS: clear to auscultation and percussion with normal breathing effort HEART: regular rate & rhythm and no murmurs and no lower extremity edema ABDOMEN:abdomen soft, non-tender and normal bowel sounds Musculoskeletal:no cyanosis of digits and no clubbing  NEURO: alert & oriented x 3 with fluent  speech, no focal motor/sensory deficits  Labs:  Lab Results  Component Value Date   WBC 4.9 11/05/2013   HGB  10.5* 11/05/2013   HCT 31.6* 11/05/2013   MCV 102.9* 11/05/2013   PLT 54* 11/05/2013   NEUTROABS 3.0 11/05/2013      Chemistry      Component Value Date/Time   NA 144 11/05/2013 1057   NA 139 08/26/2013 0945   K 3.9 11/05/2013 1057   K 3.1* 08/26/2013 0945   CL 101 08/26/2013 0945   CO2 26 11/05/2013 1057   CO2 27 08/26/2013 0945   BUN 21.5 11/05/2013 1057   BUN 8 08/26/2013 0945   CREATININE 1.1 11/05/2013 1057   CREATININE 0.81 08/26/2013 0945   CREATININE 0.85 07/17/2013 0834      Component Value Date/Time   CALCIUM 9.3 11/05/2013 1057   CALCIUM 8.5 08/26/2013 0945   ALKPHOS 115 11/05/2013 1057   ALKPHOS 59 08/21/2013 1535   AST 26 11/05/2013 1057   AST 20 08/21/2013 1535   ALT 17 11/05/2013 1057   ALT 8 08/21/2013 1535   BILITOT 0.52 11/05/2013 1057   BILITOT 0.6 08/21/2013 1535      Basic Metabolic Panel:  Recent Labs Lab 11/05/13 1057  NA 144  K 3.9  CO2 26  GLUCOSE 59*  BUN 21.5  CREATININE 1.1  CALCIUM 9.3   GFR Estimated Creatinine Clearance: 38.2 ml/min (by C-G formula based on Cr of 1.1). Liver Function Tests:  Recent Labs Lab 11/05/13 1057  AST 26  ALT 17  ALKPHOS 115  BILITOT 0.52  PROT 5.6*  ALBUMIN 3.9   Coagulation profile  Recent Labs Lab 10/30/13 0800 10/30/13 0812 11/05/13 1058  INR 2.5 2.50 2.10  PROTIME  --  30.0* 25.2*    CBC:  Recent Labs Lab 11/05/13 1057  WBC 4.9  NEUTROABS 3.0  HGB 10.5*  HCT 31.6*  MCV 102.9*  PLT 54*   Studies:  No results found.   RADIOGRAPHIC STUDIES: 10/30/2013   CLINICAL DATA: Subsequent treatment strategy for non-Hodgkin's  lymphoma.  EXAM:  NUCLEAR MEDICINE PET SKULL BASE TO THIGH  TECHNIQUE:  6.7 mCi F-18 FDG was injected intravenously. Full-ring PET imaging  was performed from the skull base to thigh after the radiotracer. CT  data was obtained and used for attenuation correction and anatomic  localization.  FASTING BLOOD GLUCOSE: Value: 102 mg/dl  COMPARISON: NM PET IMAGE RESTAG (PS) SKULL BASE TO THIGH  dated  06/16/2013; IR FLUORO GUIDE CV LINE*R* dated 04/17/2013  FINDINGS:  NECK  No hypermetabolic lymph nodes in the neck.  CHEST  The hypermetabolic right axillary lymph node seen on the previous  study is no longer hypermetabolic. The lymph node was measured at 10  x 17 mm on the previous study. It now measures 4 x 5 mm. No  unexpected or suspicious hypermetabolic uptake is identified in the  chest on today's exam.  The tip of the right-sided Port-A-Cath is positioned in the mid  right atrium. .  ABDOMEN/PELVIS  No abnormal hypermetabolic activity within the liver, pancreas,  adrenal glands, or spleen. No hypermetabolic lymph nodes in the  abdomen or pelvis.  The spleen measures 18 cm in craniocaudal length, enlarged. No  hypermetabolism within the splenic parenchyma.  There is a small amount of intraperitoneal free fluid in the pelvis,  of indeterminate etiology.  SKELETON  Diffuse FDG uptake is identified in the marrow spaces. Imaging  features likely are related to marrow stimulation secondary to  therapy.  IMPRESSION:  Interval resolution of the hypermetabolism identified within the  enlarged right axillary lymph node seen previously. This lymph node  has also decreased markedly in size in the interval.  No new hypermetabolic lesions in the neck, chest, abdomen, or  pelvis.  Stable splenomegaly.  New small amount of pelvic ascites.    ASSESSMENT: Tonya Wise 73 y.o. female with a history of Non-Hodgkins lymphoma - Plan: CBC with Differential, Comprehensive metabolic panel (Cmet) - [facility], Lactate dehydrogenase (LDH) - [facility], Protime-INR, CBC with Differential, CT Biopsy, CANCELED: CT Biopsy  Other pulmonary embolism and infarction - Plan: Protime-INR   PLAN:  1. 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 -- She continues to do well overall.  Her plts are 54K. Her PET demonstrates a complete response.  We will repeat her bone marrow biopsy to exclude the presence of  lymphoma.  If negative, we will start observation with H&P an dlabs, every 3-6 months for 5 year and then yearly or as clinically indicated.  Her CT scan should start every 6 months for 2 years after completion of treatment, then only as clinically indicated.    2. 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 --INR is 1.8.  She is being followed by anticoagulation clinic.  Treatment duration is 6 months.  PE likely secondary to immobilization during recent hospitalizations and active malignancy as noted above.  Continue coumadin and we will plan to hold if Plts less than 50K.    3. Diastolic/systolic CHF (EF 58-85%).  --Doxorubicin was discontinued due to worsening congestive heart failure. She is evolemic today.  She was started on lasix prn and has a scheduled follow up with Dr. Claiborne Billings.  4. Anemia/Thrombocytopenia secondary to chemotherapy.  --We will allow additional count recovery time.   5. 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  --Continue valcyte prophylaxis   6. Follow-up.  -- F/u in 2 weeks for labs.  F/u in 4 weeks to discuss bone marrow biopsy results.   All questions were answered. The patient knows to call the clinic with any problems, questions or concerns. We can certainly see the patient much sooner if necessary.  I spent 15 minutes counseling the patient face to face. The total time spent in the appointment was 25 minutes.    Concha Norway, MD 11/05/2013 1:41 PM

## 2013-11-05 NOTE — Patient Instructions (Signed)
Continue Coumadin to 6mg  daily. Recheck INR on 11/12/13;  Lab at 8:15am and Coumadin clinic at 8:30am.

## 2013-11-05 NOTE — Progress Notes (Signed)
INR within goal today. Hg/Hct: 10.5/31.6, pltc = 54 No problems or concerns regarding anticoagulation. No missed coumadin doses. Pt no longer taking Lasix.  No more chemo per Dr. Juliann Mule. Pt appetite is terrific.  She is eating great. She is eating salads about twice per week (iceberg and romaine lettuce). Dr. Boyce Medici note says to hold coumadin when pltc < 50. Continue Coumadin to 6mg  daily. Recheck INR on 11/12/13;  Lab at 8:15am and Coumadin clinic at 8:30am.  Coumadin 6mg  samples provided: (x 10 tablets). Lot:  [id number], Exp: 8/16

## 2013-11-05 NOTE — Patient Instructions (Signed)
Bone Marrow Aspiration This is an examination in which a needle is inserted into a bone and a portion of the marrow in the bone is removed. This test confirms the diagnosis of anemias, leukemia, or myelomas. It helps determine the cause of decreased blood cells and deficient iron stores. It documents the process of metastasis if there has been a tumor which has spread to bone. It also helps study tumor staging, especially in lymphomas. There are two methods for sampling bone marrow, an aspirate and a biopsy. The bone marrow, a soft fatty tissue found inside the body's larger bones, is often described as being like a honeycomb  it is a fibrous network filled with a liquid containing cells in various stages of maturation, and "raw materials" such as iron, vitamin B12, and folate that are required for cell production. The bone marrow aspirate provides a liquid sample of cells that can be studied individually, and the biopsy collects a sample that preserves the marrow's structure and shows the relationships of bone marrow cells to one another and the relative amount of marrow cells compared to fat (cellularity).  Red blood cells (RBCs), platelets, and five different types of white blood cells (WBCs) are produced in the marrow as needed, with the number and type of cell being created at any one time based on the use of cells, loss, and a continual replacement of old cells. The bone marrow biopsy and aspiration provide information about the status of and capability for blood cell production. They are not routinely ordered and in fact the majority of people will never have one done. A marrow aspiration or biopsy may be ordered to help evaluate blood cell production, to help diagnose leukemia, to help diagnose a bone marrow disorder, to help diagnose and stage a variety of other types of cancer (to determine spread into the marrow), and to help determine whether a severe anemia is due to decreased RBC production,  increased loss, abnormal RBC production, or to a vitamin or mineral deficiency or excess. Conditions that affect the marrow can affect the number, mixture, and maturity of the cells, and can affect its fibrous structure. PREPARATION FOR TEST  No special preparation is needed. The bone marrow aspiration or biopsy procedure is performed by a caregiver. Both types of samples may be collected from the hip bone (pelvis), and marrow aspirations may be collected from the breastbone (sternum). In children, samples may also be collected from a vertebrae in the back or from the thigh bone (femur).  The most common collection site is the top ridge of the hip bone (iliac crest). Before the procedure, your blood pressure, heart rate, and temperature are measured and evaluated to make sure that they are within normal limits. You may be given a mild sedative. You will be asked to lie down on your stomach or side. Your lower body is draped with cloths so that only the area surrounding the site is exposed.  The site is cleaned with an antiseptic and injected with a local anesthetic. When the site is numb, the caregiver inserts a needle through the skin and into the bone. For an aspiration, the caregiver attaches a syringe to the needle and pulls back on the plunger. This creates vacuum pressure and pulls a small amount of marrow into the syringe.  For a bone marrow biopsy, the caregiver uses a special needle that allows the collection of a sample of bone and marrow. Even though your skin has been numbed, you may feel   brief but uncomfortable pressure sensations during these procedures. After the needle has been withdrawn, a sterile bandage is placed over the site and pressure is applied. You will then usually be instructed to lie quietly until your blood pressure, heart rate, and temperature are normal. You may be instructed to keep the site dry and covered for 48 hours.  Complications from the bone marrow aspiration or  biopsy procedure are rare, but some individuals may have excessive bleeding at the site or develop an infection.  Tell your caregiver about any allergies you have and about any medications or supplements you are taking prior to the procedure. NORMAL FINDINGS Cell type / Range (%)  Myeloblasts / less than 5  Promyelocytes / 1-8  Myelocytes  Neutrophilic / 5-15  Eosinophilic / 0.5-3  Basophilic / less than 1  Metamyelocytes  Neutrophilic / 15-25  Eosinophilic / less than 1  Basophilic / less than 1  Mature myelocytes  Neutrophilic / 10-30  Eosinophilic / less than 5  Basophilic / less than 5  Mononuclear  Monocytes / less than 5  Lymphocytes / 3-20  Plasma cells / less than 1  Megakaryocytes / less than 5  M/E ratio / less than 4  Normoblasts / 25-50 Normal iron content is demonstrated by staining with Prussian blue. Ranges for normal findings may vary among different laboratories and hospitals. You should always check with your caregiver after having lab work or other tests done to discuss the meaning of your test results and whether your values are considered within normal limits. MEANING OF TEST  Your caregiver will go over the test results with you and discuss the importance and meaning of your results, as well as treatment options and the need for additional tests if necessary. OBTAINING THE TEST RESULTS It is your responsibility to obtain your test results. Ask the lab or department performing the test when and how you will get your results. Document Released: 08/08/2004 Document Revised: 10/08/2011 Document Reviewed: 06/22/2008 [employer] Patient Information 2014 [employer].  

## 2013-11-06 ENCOUNTER — Ambulatory Visit: Payer: Medicare Other

## 2013-11-07 ENCOUNTER — Other Ambulatory Visit: Payer: Self-pay | Admitting: Family Medicine

## 2013-11-08 ENCOUNTER — Other Ambulatory Visit: Payer: Self-pay | Admitting: Internal Medicine

## 2013-11-09 ENCOUNTER — Ambulatory Visit: Payer: Medicare Other | Admitting: Physical Therapy

## 2013-11-09 NOTE — Telephone Encounter (Signed)
Is this ok to refill?  

## 2013-11-12 ENCOUNTER — Ambulatory Visit (HOSPITAL_BASED_OUTPATIENT_CLINIC_OR_DEPARTMENT_OTHER): Payer: Medicare Other | Admitting: Pharmacist

## 2013-11-12 ENCOUNTER — Other Ambulatory Visit (HOSPITAL_BASED_OUTPATIENT_CLINIC_OR_DEPARTMENT_OTHER): Payer: Medicare Other

## 2013-11-12 DIAGNOSIS — I2699 Other pulmonary embolism without acute cor pulmonale: Secondary | ICD-10-CM | POA: Diagnosis not present

## 2013-11-12 DIAGNOSIS — Z7901 Long term (current) use of anticoagulants: Secondary | ICD-10-CM | POA: Diagnosis not present

## 2013-11-12 LAB — POCT INR: INR: 2.1

## 2013-11-12 LAB — PROTIME-INR
INR: 2.1 (ref 2.00–3.50)
PROTIME: 25.2 s — AB (ref 10.6–13.4)

## 2013-11-12 NOTE — Progress Notes (Signed)
INR = 2.1 on Coumadin 6 mg daily Minor scattered bruises.  No bleeding. Pt is off chemo now.  Has BMBX next week. Still has mouth ulcers on her tongue for which she uses Nystatin susp. TID.  She is off Zebeta & is on Lopressor. INR at goal.  Continue Coumadin 6 mg daily. Recheck INR on 12/04/13 when she is here for MD visit. Samples: Coumadin 6 mg x 20 tabs (lot #[id number]; exp 02/2015) Kennith Center, Pharm.D., CPP 11/12/2013@8 :52 AM

## 2013-11-13 ENCOUNTER — Encounter (HOSPITAL_COMMUNITY): Payer: Self-pay | Admitting: Pharmacy Technician

## 2013-11-17 ENCOUNTER — Other Ambulatory Visit: Payer: Self-pay | Admitting: Radiology

## 2013-11-19 ENCOUNTER — Other Ambulatory Visit (HOSPITAL_BASED_OUTPATIENT_CLINIC_OR_DEPARTMENT_OTHER): Payer: Medicare Other

## 2013-11-19 ENCOUNTER — Ambulatory Visit (HOSPITAL_COMMUNITY)
Admission: RE | Admit: 2013-11-19 | Discharge: 2013-11-19 | Disposition: A | Discharge status: Home / Self Care | Payer: Medicare Other | Source: Ambulatory Visit | Attending: Internal Medicine | Admitting: Internal Medicine

## 2013-11-19 ENCOUNTER — Encounter (HOSPITAL_COMMUNITY): Payer: Self-pay

## 2013-11-19 DIAGNOSIS — Z806 Family history of leukemia: Secondary | ICD-10-CM | POA: Diagnosis not present

## 2013-11-19 DIAGNOSIS — Z87891 Personal history of nicotine dependence: Secondary | ICD-10-CM | POA: Insufficient documentation

## 2013-11-19 DIAGNOSIS — C8589 Other specified types of non-Hodgkin lymphoma, extranodal and solid organ sites: Secondary | ICD-10-CM | POA: Diagnosis not present

## 2013-11-19 DIAGNOSIS — D61818 Other pancytopenia: Secondary | ICD-10-CM | POA: Insufficient documentation

## 2013-11-19 DIAGNOSIS — C8590 Non-Hodgkin lymphoma, unspecified, unspecified site: Secondary | ICD-10-CM

## 2013-11-19 DIAGNOSIS — K219 Gastro-esophageal reflux disease without esophagitis: Secondary | ICD-10-CM | POA: Diagnosis not present

## 2013-11-19 DIAGNOSIS — Z9221 Personal history of antineoplastic chemotherapy: Secondary | ICD-10-CM | POA: Diagnosis not present

## 2013-11-19 DIAGNOSIS — I10 Essential (primary) hypertension: Secondary | ICD-10-CM | POA: Diagnosis not present

## 2013-11-19 DIAGNOSIS — D649 Anemia, unspecified: Secondary | ICD-10-CM | POA: Diagnosis not present

## 2013-11-19 DIAGNOSIS — Z880 Allergy status to penicillin: Secondary | ICD-10-CM | POA: Diagnosis not present

## 2013-11-19 DIAGNOSIS — Z7901 Long term (current) use of anticoagulants: Secondary | ICD-10-CM | POA: Diagnosis not present

## 2013-11-19 LAB — CBC WITH DIFFERENTIAL/PLATELET
BASO%: 0.6 % (ref 0.0–2.0)
Basophils Absolute: 0 10*3/uL (ref 0.0–0.1)
EOS%: 2.2 % (ref 0.0–7.0)
Eosinophils Absolute: 0.1 10*3/uL (ref 0.0–0.5)
HEMATOCRIT: 30.9 % — AB (ref 34.8–46.6)
HGB: 10.3 g/dL — ABNORMAL LOW (ref 11.6–15.9)
LYMPH%: 32.6 % (ref 14.0–49.7)
MCH: 33.7 pg (ref 25.1–34.0)
MCHC: 33.4 g/dL (ref 31.5–36.0)
MCV: 100.9 fL (ref 79.5–101.0)
MONO#: 0.4 10*3/uL (ref 0.1–0.9)
MONO%: 12.9 % (ref 0.0–14.0)
NEUT#: 1.5 10*3/uL (ref 1.5–6.5)
NEUT%: 51.7 % (ref 38.4–76.8)
Platelets: 52 10*3/uL — ABNORMAL LOW (ref 145–400)
RBC: 3.06 10*6/uL — ABNORMAL LOW (ref 3.70–5.45)
RDW: 13.9 % (ref 11.2–14.5)
WBC: 3 10*3/uL — ABNORMAL LOW (ref 3.9–10.3)
lymph#: 1 10*3/uL (ref 0.9–3.3)

## 2013-11-19 LAB — BONE MARROW EXAM

## 2013-11-19 LAB — PROTIME-INR
INR: 2.57 — AB (ref 0.00–1.49)
Prothrombin Time: 26.7 seconds — ABNORMAL HIGH (ref 11.6–15.2)

## 2013-11-19 LAB — APTT: aPTT: 56 seconds — ABNORMAL HIGH (ref 24–37)

## 2013-11-19 MED ORDER — MIDAZOLAM HCL 2 MG/2ML IJ SOLN
INTRAMUSCULAR | Status: AC
  Filled 2013-11-19: qty 4

## 2013-11-19 MED ORDER — FENTANYL CITRATE 0.05 MG/ML IJ SOLN
INTRAMUSCULAR | Status: AC | PRN
  Administered 2013-11-19: 100 ug via INTRAVENOUS

## 2013-11-19 MED ORDER — SODIUM CHLORIDE 0.9 % IV SOLN
INTRAVENOUS | Status: DC
  Administered 2013-11-19: 09:00:00 via INTRAVENOUS

## 2013-11-19 MED ORDER — HYDROCODONE-ACETAMINOPHEN 5-325 MG PO TABS
1.0000 | ORAL_TABLET | ORAL | Status: DC | PRN
  Filled 2013-11-19: qty 2

## 2013-11-19 MED ORDER — FENTANYL CITRATE 0.05 MG/ML IJ SOLN
INTRAMUSCULAR | Status: AC
  Filled 2013-11-19: qty 4

## 2013-11-19 MED ORDER — MIDAZOLAM HCL 2 MG/2ML IJ SOLN
INTRAMUSCULAR | Status: AC | PRN
  Administered 2013-11-19: 2 mg via INTRAVENOUS

## 2013-11-19 NOTE — Progress Notes (Signed)
Pt had not used emla cream to portacath prior to arrival to [facility] therefore requested PIV instead of accessing port.

## 2013-11-19 NOTE — H&P (Signed)
Tonya Wise is an 73 y.o. female.   Chief Complaint: Pt has had 6 cycles of chemo treating Non Hodgkins Lymphoma Blood works all "good per her MD" Scheduled now for Bone Marrow biopsy to evaluate if treatment is complete  HPI: HTN; GERD; NHL On coumadin INR 2.57 today  Past Medical History  Diagnosis Date  . Hypertension   . Renal insufficiency   . Osteopenia   . Allergy   . Heart murmur   . GERD (gastroesophageal reflux disease)   . Anemia   . Hypercalcemia 03/27/2013  . Non-Hodgkins lymphoma 04/06/2013  . Diastolic dysfunction, grade I 04/12/2013    Past Surgical History  Procedure Laterality Date  . Appendectomy    . Colonoscopy  2008  . Spine surgery    . Back surgery      LOWER BACK TWICE  . Abdominal hysterectomy    . Esophagogastroduodenoscopy N/A 03/29/2013    Procedure: ESOPHAGOGASTRODUODENOSCOPY (EGD);  Surgeon: Juanita Craver, MD;  Location[facility] ENDOSCOPY;  Service: Endoscopy;  Laterality: N/A;  . Inguinal lymph node biopsy Right 03/31/2013    Procedure: INGUINAL LYMPH NODE BIOPSY;  Surgeon: Gwenyth Ober, MD;  Location: [place];  Service: General;  Laterality: Right;    Family History  Problem Relation Age of Onset  . Heart attack Father 25  . Leukemia Mother   . Diabetes Brother   . Diabetes Sister    Social History:  reports that she quit smoking about 53 years ago. Her smoking use included Cigarettes. She has a 1 pack-year smoking history. She has never used smokeless tobacco. She reports that she drinks alcohol. She reports that she does not use illicit drugs.  Allergies:  Allergies  Allergen Reactions  . Penicillins Rash     (Not in a hospital admission)  Results for orders placed during the hospital encounter of 11/19/13 (from the past 48 hour(s))  APTT     Status: Abnormal   Collection Time    11/19/13  9:17 AM      Result Value Ref Range   aPTT 56 (*) 24 - 37 seconds   Comment:            IF BASELINE aPTT IS ELEVATED,     SUGGEST PATIENT RISK  ASSESSMENT     BE USED TO DETERMINE APPROPRIATE     ANTICOAGULANT THERAPY.  PROTIME-INR     Status: Abnormal   Collection Time    11/19/13  9:17 AM      Result Value Ref Range   Prothrombin Time 26.7 (*) 11.6 - 15.2 seconds   INR 2.57 (*) 0.00 - 1.49   No results found.  Review of Systems  Constitutional: Negative for fever, chills and weight loss.  Respiratory: Negative for cough and shortness of breath.   Cardiovascular: Negative for chest pain.  Gastrointestinal: Negative for nausea, vomiting and abdominal pain.  Neurological: Negative for dizziness and weakness.  Psychiatric/Behavioral: Negative for substance abuse.    Blood pressure 164/85, pulse 77, temperature 97.6 F (36.4 C), temperature source Oral, resp. rate 18, SpO2 100.00%. Physical Exam  Constitutional: She is oriented to person, place, and time. She appears well-developed and well-nourished.  Cardiovascular: Normal rate and regular rhythm.   Murmur heard. Respiratory: Effort normal and breath sounds normal. She has no wheezes.  GI: Soft. Bowel sounds are normal. There is no tenderness.  Musculoskeletal: Normal range of motion.  Neurological: She is alert and oriented to person, place, and time.  Skin: Skin is  warm and dry.  Psychiatric: She has a normal mood and affect. Her behavior is normal. Judgment and thought content normal.     Assessment/Plan NHL  6 cycles chemo complete Need BM bx to be sure treatment is complete Pt aware of procedure benefits and risks and agreeable to proceed Consent signed and in chart  Lavonia Drafts 11/19/2013, 10:22 AM

## 2013-11-19 NOTE — Procedures (Signed)
CT guided bone marrow biopsy.  No immediate complication.   

## 2013-11-19 NOTE — Discharge Instructions (Signed)
Bone Marrow Aspiration °This is an examination in which a needle is inserted into a bone and a portion of the marrow in the bone is removed. This test confirms the diagnosis of anemias, leukemia, or myelomas. It helps determine the cause of decreased blood cells and deficient iron stores. It documents the process of metastasis if there has been a tumor which has spread to bone. It also helps study tumor staging, especially in lymphomas. °There are two methods for sampling bone marrow, an aspirate and a biopsy. The bone marrow, a soft fatty tissue found inside the body's larger bones, is often described as being like a honeycomb  it is a fibrous network filled with a liquid containing cells in various stages of maturation, and "raw materials" such as iron, vitamin B12, and folate that are required for cell production. The bone marrow aspirate provides a liquid sample of cells that can be studied individually, and the biopsy collects a sample that preserves the marrow's structure and shows the relationships of bone marrow cells to one another and the relative amount of marrow cells compared to fat (cellularity).  °Red blood cells (RBCs), platelets, and five different types of white blood cells (WBCs) are produced in the marrow as needed, with the number and type of cell being created at any one time based on the use of cells, loss, and a continual replacement of old cells. °The bone marrow biopsy and aspiration provide information about the status of and capability for blood cell production. They are not routinely ordered and in fact the majority of people will never have one done. A marrow aspiration or biopsy may be ordered to help evaluate blood cell production, to help diagnose leukemia, to help diagnose a bone marrow disorder, to help diagnose and stage a variety of other types of cancer (to determine spread into the marrow), and to help determine whether a severe anemia is due to decreased RBC production,  increased loss, abnormal RBC production, or to a vitamin or mineral deficiency or excess. Conditions that affect the marrow can affect the number, mixture, and maturity of the cells, and can affect its fibrous structure. °PREPARATION FOR TEST °· No special preparation is needed. The bone marrow aspiration or biopsy procedure is performed by a caregiver. Both types of samples may be collected from the hip bone (pelvis), and marrow aspirations may be collected from the breastbone (sternum). In children, samples may also be collected from a vertebrae in the back or from the thigh bone (femur). °· The most common collection site is the top ridge of the hip bone (iliac crest). Before the procedure, your blood pressure, heart rate, and temperature are measured and evaluated to make sure that they are within normal limits. You may be given a mild sedative. You will be asked to lie down on your stomach or side. Your lower body is draped with cloths so that only the area surrounding the site is exposed. °· The site is cleaned with an antiseptic and injected with a local anesthetic. When the site is numb, the caregiver inserts a needle through the skin and into the bone. For an aspiration, the caregiver attaches a syringe to the needle and pulls back on the plunger. This creates vacuum pressure and pulls a small amount of marrow into the syringe. °· For a bone marrow biopsy, the caregiver uses a special needle that allows the collection of a sample of bone and marrow. Even though your skin has been numbed, you may feel   brief but uncomfortable pressure sensations during these procedures. After the needle has been withdrawn, a sterile bandage is placed over the site and pressure is applied. You will then usually be instructed to lie quietly until your blood pressure, heart rate, and temperature are normal. You may be instructed to keep the site dry and covered for 48 hours. °· Complications from the bone marrow aspiration or  biopsy procedure are rare, but some individuals may have excessive bleeding at the site or develop an infection. °· Tell your caregiver about any allergies you have and about any medications or supplements you are taking prior to the procedure. °NORMAL FINDINGS °Cell type / Range (%) °· Myeloblasts / less than 5 °· Promyelocytes / 1-8 °· Myelocytes °· Neutrophilic / 5-15 °· Eosinophilic / 0.5-3 °· Basophilic / less than 1 °· Metamyelocytes °· Neutrophilic / 15-25 °· Eosinophilic / less than 1 °· Basophilic / less than 1 °· Mature myelocytes °· Neutrophilic / 10-30 °· Eosinophilic / less than 5 °· Basophilic / less than 5 °· Mononuclear °· Monocytes / less than 5 °· Lymphocytes / 3-20 °· Plasma cells / less than 1 °· Megakaryocytes / less than 5 °· M/E ratio / less than 4 °· Normoblasts / 25-50 °Normal iron content is demonstrated by staining with Prussian blue. °Ranges for normal findings may vary among different laboratories and hospitals. You should always check with your caregiver after having lab work or other tests done to discuss the meaning of your test results and whether your values are considered within normal limits. °MEANING OF TEST  °Your caregiver will go over the test results with you and discuss the importance and meaning of your results, as well as treatment options and the need for additional tests if necessary. °OBTAINING THE TEST RESULTS °It is your responsibility to obtain your test results. Ask the lab or department performing the test when and how you will get your results. °Document Released: 08/08/2004 Document Revised: 10/08/2011 Document Reviewed: 06/22/2008 °[employer]® Patient Information ©2014 [employer]. ° °Bone Marrow Aspiration, Bone Marrow Biopsy °Care After °Read the instructions outlined below and refer to this sheet in the next few weeks. These discharge instructions provide you with general information on caring for yourself after you leave the hospital. Your caregiver may also  give you specific instructions. While your treatment has been planned according to the most current medical practices available, unavoidable complications occasionally occur. If you have any problems or questions after discharge, call your caregiver. °FINDING OUT THE RESULTS OF YOUR TEST °Not all test results are available during your visit. If your test results are not back during the visit, make an appointment with your caregiver to find out the results. Do not assume everything is normal if you have not heard from your caregiver or the medical facility. It is important for you to follow up on all of your test results.  °HOME CARE INSTRUCTIONS  °You have had sedation and may be sleepy or dizzy. Your thinking may not be as clear as usual. For the next 24 hours: °· Only take over-the-counter or prescription medicines for pain, discomfort, and or fever as directed by your caregiver. °· Do not drink alcohol. °· Do not smoke. °· Do not drive. °· Do not make important legal decisions. °· Do not operate heavy machinery. °· Do not care for small children by yourself. °· Keep your dressing clean and dry. You may replace dressing with a bandage after 24 hours. °· You may take a   shower after 24 hours.  Use an ice pack for 20 minutes every 2 hours while awake for pain as needed. SEEK MEDICAL CARE IF:   There is redness, swelling, or increasing pain at the biopsy site.  There is pus coming from the biopsy site.  There is drainage from a biopsy site lasting longer than one day.  An unexplained oral temperature above 102 F (38.9 C) develops. SEEK IMMEDIATE MEDICAL CARE IF:   You develop a rash.  You have difficulty breathing.  You develop any reaction or side effects to medications given. Document Released: 02/02/2005 Document Revised: 10/08/2011 Document Reviewed: 07/13/2008 [facility] Patient Information 2014 [employer].

## 2013-11-25 ENCOUNTER — Ambulatory Visit: Payer: Medicare Other | Attending: Internal Medicine | Admitting: Physical Therapy

## 2013-11-25 DIAGNOSIS — IMO0001 Reserved for inherently not codable concepts without codable children: Secondary | ICD-10-CM | POA: Insufficient documentation

## 2013-11-25 DIAGNOSIS — R29898 Other symptoms and signs involving the musculoskeletal system: Secondary | ICD-10-CM | POA: Insufficient documentation

## 2013-11-25 DIAGNOSIS — C8589 Other specified types of non-Hodgkin lymphoma, extranodal and solid organ sites: Secondary | ICD-10-CM | POA: Insufficient documentation

## 2013-11-26 LAB — CHROMOSOME ANALYSIS, BONE MARROW

## 2013-12-01 ENCOUNTER — Ambulatory Visit: Payer: Medicare Other | Attending: Internal Medicine

## 2013-12-01 DIAGNOSIS — Z79899 Other long term (current) drug therapy: Secondary | ICD-10-CM | POA: Diagnosis not present

## 2013-12-01 DIAGNOSIS — Z8673 Personal history of transient ischemic attack (TIA), and cerebral infarction without residual deficits: Secondary | ICD-10-CM | POA: Diagnosis not present

## 2013-12-01 DIAGNOSIS — R197 Diarrhea, unspecified: Secondary | ICD-10-CM | POA: Insufficient documentation

## 2013-12-01 DIAGNOSIS — I2699 Other pulmonary embolism without acute cor pulmonale: Secondary | ICD-10-CM | POA: Insufficient documentation

## 2013-12-01 DIAGNOSIS — I2782 Chronic pulmonary embolism: Secondary | ICD-10-CM | POA: Insufficient documentation

## 2013-12-01 DIAGNOSIS — M129 Arthropathy, unspecified: Secondary | ICD-10-CM | POA: Insufficient documentation

## 2013-12-01 DIAGNOSIS — E876 Hypokalemia: Secondary | ICD-10-CM | POA: Diagnosis not present

## 2013-12-01 DIAGNOSIS — C8589 Other specified types of non-Hodgkin lymphoma, extranodal and solid organ sites: Secondary | ICD-10-CM | POA: Diagnosis not present

## 2013-12-01 DIAGNOSIS — IMO0001 Reserved for inherently not codable concepts without codable children: Secondary | ICD-10-CM | POA: Insufficient documentation

## 2013-12-01 DIAGNOSIS — Z7901 Long term (current) use of anticoagulants: Secondary | ICD-10-CM | POA: Diagnosis not present

## 2013-12-02 ENCOUNTER — Ambulatory Visit: Payer: Medicare Other | Admitting: Physical Therapy

## 2013-12-02 DIAGNOSIS — IMO0001 Reserved for inherently not codable concepts without codable children: Secondary | ICD-10-CM | POA: Diagnosis not present

## 2013-12-04 ENCOUNTER — Ambulatory Visit (HOSPITAL_BASED_OUTPATIENT_CLINIC_OR_DEPARTMENT_OTHER): Payer: Medicare Other | Admitting: Pharmacist

## 2013-12-04 ENCOUNTER — Ambulatory Visit (HOSPITAL_BASED_OUTPATIENT_CLINIC_OR_DEPARTMENT_OTHER): Payer: Medicare Other | Admitting: Internal Medicine

## 2013-12-04 ENCOUNTER — Other Ambulatory Visit (HOSPITAL_BASED_OUTPATIENT_CLINIC_OR_DEPARTMENT_OTHER): Payer: Medicare Other

## 2013-12-04 VITALS — BP 163/86 | HR 79 | Temp 97.6°F | Resp 18 | Ht 63.0 in | Wt 123.1 lb

## 2013-12-04 DIAGNOSIS — I2699 Other pulmonary embolism without acute cor pulmonale: Secondary | ICD-10-CM

## 2013-12-04 DIAGNOSIS — C8589 Other specified types of non-Hodgkin lymphoma, extranodal and solid organ sites: Secondary | ICD-10-CM

## 2013-12-04 DIAGNOSIS — I509 Heart failure, unspecified: Secondary | ICD-10-CM | POA: Diagnosis not present

## 2013-12-04 DIAGNOSIS — R188 Other ascites: Secondary | ICD-10-CM | POA: Diagnosis not present

## 2013-12-04 DIAGNOSIS — R161 Splenomegaly, not elsewhere classified: Secondary | ICD-10-CM | POA: Diagnosis not present

## 2013-12-04 DIAGNOSIS — D61810 Antineoplastic chemotherapy induced pancytopenia: Secondary | ICD-10-CM | POA: Diagnosis not present

## 2013-12-04 DIAGNOSIS — C8590 Non-Hodgkin lymphoma, unspecified, unspecified site: Secondary | ICD-10-CM

## 2013-12-04 DIAGNOSIS — T451X5A Adverse effect of antineoplastic and immunosuppressive drugs, initial encounter: Secondary | ICD-10-CM

## 2013-12-04 LAB — CBC WITH DIFFERENTIAL/PLATELET
BASO%: 0.7 % (ref 0.0–2.0)
Basophils Absolute: 0 10*3/uL (ref 0.0–0.1)
EOS%: 3.3 % (ref 0.0–7.0)
Eosinophils Absolute: 0.1 10*3/uL (ref 0.0–0.5)
HCT: 32.5 % — ABNORMAL LOW (ref 34.8–46.6)
HGB: 10.9 g/dL — ABNORMAL LOW (ref 11.6–15.9)
LYMPH#: 1 10*3/uL (ref 0.9–3.3)
LYMPH%: 31.6 % (ref 14.0–49.7)
MCH: 32.4 pg (ref 25.1–34.0)
MCHC: 33.5 g/dL (ref 31.5–36.0)
MCV: 96.7 fL (ref 79.5–101.0)
MONO#: 0.4 10*3/uL (ref 0.1–0.9)
MONO%: 13 % (ref 0.0–14.0)
NEUT#: 1.6 10*3/uL (ref 1.5–6.5)
NEUT%: 51.4 % (ref 38.4–76.8)
Platelets: 62 10*3/uL — ABNORMAL LOW (ref 145–400)
RBC: 3.36 10*6/uL — AB (ref 3.70–5.45)
RDW: 13.5 % (ref 11.2–14.5)
WBC: 3.2 10*3/uL — AB (ref 3.9–10.3)

## 2013-12-04 LAB — COMPREHENSIVE METABOLIC PANEL (CC13)
ALT: 25 U/L (ref 0–55)
ANION GAP: 9 meq/L (ref 3–11)
AST: 28 U/L (ref 5–34)
Albumin: 3.5 g/dL (ref 3.5–5.0)
Alkaline Phosphatase: 108 U/L (ref 40–150)
BUN: 16.8 mg/dL (ref 7.0–26.0)
CALCIUM: 9.2 mg/dL (ref 8.4–10.4)
CHLORIDE: 109 meq/L (ref 98–109)
CO2: 24 meq/L (ref 22–29)
CREATININE: 1.2 mg/dL — AB (ref 0.6–1.1)
Glucose: 96 mg/dl (ref 70–140)
Potassium: 3.9 mEq/L (ref 3.5–5.1)
Sodium: 142 mEq/L (ref 136–145)
Total Bilirubin: 0.69 mg/dL (ref 0.20–1.20)
Total Protein: 5.3 g/dL — ABNORMAL LOW (ref 6.4–8.3)

## 2013-12-04 LAB — LACTATE DEHYDROGENASE (CC13): LDH: 252 U/L — AB (ref 125–245)

## 2013-12-04 LAB — PROTIME-INR
INR: 2.6 (ref 2.00–3.50)
PROTIME: 31.2 s — AB (ref 10.6–13.4)

## 2013-12-04 LAB — POCT INR: INR: 2.6

## 2013-12-04 NOTE — Progress Notes (Signed)
Tonya Wise  Tonya Haste, MD [place] [address]  DIAGNOSIS: Non-Hodgkins lymphoma - Plan: CBC with Differential, CBC with Differential, Lactate dehydrogenase (LDH) - [facility], Comprehensive metabolic panel (Cmet) - [facility]  Other pulmonary embolism and infarction - Plan: CBC with Differential, CBC with Differential, Lactate dehydrogenase (LDH) - [facility], Comprehensive metabolic panel (Cmet) - [facility]  Chief Complaint  Patient presents with  . Lymphoma      Non-Hodgkins lymphoma   03/27/2013 - 03/31/2013 Hospital Admission Admitted to [facility] for profound anemia (Hgb 6.4).  C/o fevers, drenching nightsweats, weight lost.  Negative colonoscopy and EGD.  CT of C/A/P demonstrated significant lymphadenopathy suspcious of ympha.     03/31/2013 Initial Diagnosis Biopsy of R inguinal lymph node revealed Non-Hodgkins lymphoma. Diffuse Large B-cell Lymphoma   04/10/2013 - 04/24/2013 Hospital Admission Hypercalcemia, renal failure and dehydration.  Started min-RCHOP.    04/15/2013 Bone Marrow Biopsy Involvement with Diffuse Large B-cell Lymphoma. Stage IV NHL w B-symptoms.    04/18/2013 - 04/18/2013 Chemotherapy Mini R-CHOP Cycle #1 on 04/18/2013. Dose reduced to poor nutrion. It consisted on Cytoxan 400 mg/m2,Doxorubicin 25 mg/m2, Rituximab 375 mg/m2, Vincristine 57m.  Prednisone 100 mg daily for 9/13 - 9/16.   Received neupogen on 9/21, 9/27 -9/30.     04/25/2013 - 04/29/2013 Hospital Admission Admitted due to worsening mouth sores/oral thrush, odynophagia, failure to thrive.  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  [facility]Dr[facility] CGwynneth Munson and discharged to home on 05/19/2013.     06/01/2013 - 06/01/2013 Chemotherapy Mini R-CHO #2 dosed as above.  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  Received neulasta shot on 06/02/13   06/16/2013 Cancer Staging Re-staging  PET/CT demonstrated significant interval reduction in axillary lymphadenopathy.  There was 1 small residual right axillary lymph node measuring 16 x 12 mm which showed FDG uptake.  Signifcant reduction in mediastinal and hilar lymphadenopathy.    06/16/2013 Imaging CT of chest noted left-sided pulmonary embolim. Started on lovenox to coumadin bridge.     06/22/2013 - 06/22/2013 Chemotherapy Mini-R-CHO #3.  Received Neulasta on 06/23/2013.     07/16/2013 Imaging 2-D Echocardiogram demonstrates 35-45% EF with Grade 2 diastolic dysfunction.  Doxorubicin discontinued with consultation by cardiology (Dr. KClaiborne Billings.     08/19/2013 - 08/21/2013 Chemotherapy Mini-R-CEO #4.  Etoposide 25 mg/m2 instead of doxo due to above.  Etoposide 544mbid on day 2/3.  On day 3 (08/21/2013) she received 1 of two oral doses of ectoposide.    08/21/2013 - 08/26/2013 [facility] and had influenzae A and pneumonia. Completed tamiflu and oral antibiotics.    09/25/2013 - 09/27/2013 Chemotherapy Mini-R-CEO #5. Etoposide 25 mg/m2 25 mg bid on days 2/3.    10/16/2013 - 10/19/2013 Chemotherapy Mini-R-CEO # 6. Etoposide 25 mg/m2 bid on days 2/3. Neulasta on day#4.    11/05/2013 Imaging Interval resolution of the hypermetabolism identified within the enlarged right axillary lymph node seen previously. This lymph node has also decreased markedly in size in the interval. No new hypermetabolic lesions in the neck, chest, abdomen, or  pelvis.    11/19/2013 Bone Marrow Biopsy There is no evidence of a B-cell lymphoproliferative process in this material. Normal cytogenetics.    INTERVAL HISTORY: Tonya RUFF89.o. female with the above is here for follow-up.  She was last seen by me on 11/05/2013.  She reports starting physical  therapy and being very active in her garden.  She denies any fevers.  She reports that her appetite has improved.  She still complains of mild tingling of her tongue  making it hard to tolerate some foods especially sweets.   She presents along with her husband Tonya Wise and her daughter. She reports improvement in her cough. She denies melena or hematochezia.  She is being followed by the coumadin clinic.  MEDICAL HISTORY: Past Medical History  Diagnosis Date  . Hypertension   . Renal insufficiency   . Osteopenia   . Allergy   . Heart murmur   . GERD (gastroesophageal reflux disease)   . Anemia   . Hypercalcemia 03/27/2013  . Non-Hodgkins lymphoma 04/06/2013  . Diastolic dysfunction, grade I 04/12/2013    INTERIM HISTORY: has Hypercalcemia; Non-Hodgkins lymphoma; Elevated LFTs; Protein-calorie malnutrition, severe; Anemia of chronic disease; Acute on chronic combined systolic and diastolic heart failure; Other pulmonary embolism and infarction; Acute on chronic cough; Dysphagia, unspecified(787.20); Cardiomyopathy; Diastolic dysfunction grade 2; PNA (pneumonia); Influenza with pneumonia; Thrombocytopenia, unspecified; Acute respiratory failure with hypoxia secondary to influenza A with pneumonia; UTI (urinary tract infection); History of pulmonary embolism; HTN (hypertension); Hypokalemia; and Thrush on her problem list.    ALLERGIES:  is allergic to penicillins.  MEDICATIONS: has a current medication list which includes the following prescription(s): lidocaine-prilocaine, losartan, metoprolol tartrate, omeprazole, potassium chloride, PRESCRIPTION MEDICATION, valacyclovir, warfarin, hydrocodone-homatropine, and nystatin.  SURGICAL HISTORY:  Past Surgical History  Procedure Laterality Date  . Appendectomy    . Colonoscopy  2008  . Spine surgery    . Back surgery      LOWER BACK TWICE  . Abdominal hysterectomy    . Esophagogastroduodenoscopy N/A 03/29/2013    Procedure: ESOPHAGOGASTRODUODENOSCOPY (EGD);  Surgeon: Juanita Craver, MD;  Location: [facility] ENDOSCOPY;  Service: Endoscopy;  Laterality: N/A;  . Inguinal lymph node biopsy Right 03/31/2013    Procedure:  INGUINAL LYMPH NODE BIOPSY;  Surgeon: Gwenyth Ober, MD;  Location: [facility];  Service: General;  Laterality: Right;    REVIEW OF SYSTEMS:   Constitutional: Denies fevers, chills; She continues to have decreased in her taste.  Eyes: Denies blurriness of vision Ears, nose, mouth, throat, and face:  Respiratory: Denies cough, dyspnea or wheezes Cardiovascular: Denies palpitation, chest discomfort or lower extremity swelling Gastrointestinal:  Denies nausea, heartburn or change in bowel habits; abdominal discomfort as noted above.  Skin: Denies abnormal skin rashes Lymphatics: Denies new lymphadenopathy or easy bruising Neurological:Denies numbness, tingling or new weaknesses Behavioral/Psych: Mood is stable, no new changes  All other systems were reviewed with the patient and are negative.  PHYSICAL EXAMINATION: ECOG PERFORMANCE STATUS: 0 - Asymptomatic  Blood pressure 163/86, pulse 79, temperature 97.6 F (36.4 C), temperature source Oral, resp. rate 18, height '5\' 3"'  (1.6 m), weight 123 lb 1.6 oz (55.838 kg), SpO2 100.00%. (weight 122 lbs on last visit)  GENERAL:alert, no distress and comfortable; easily ambulatory.  SKIN: skin color, texture, turgor are normal, no rashes or significant lesions.  EYES: normal, Conjunctiva are pink and non-injected, sclera clear OROPHARYNX:no exudate, no erythema and lips, buccal mucosa, and tongue with smooth surface suggestive of recovering mucositis.    NECK: supple, thyroid normal size, non-tender, without nodularity LYMPH:  no palpable lymphadenopathy in the cervical, axillary or supraclavicular LUNGS: clear to auscultation and percussion with normal breathing effort HEART: regular rate & rhythm and no murmurs and no lower extremity edema ABDOMEN:abdomen soft, non-tender and normal bowel sounds Musculoskeletal:no cyanosis of digits and  no clubbing  NEURO: alert & oriented x 3 with fluent speech, no focal motor/sensory deficits  Labs:  Lab Results   Component Value Date   WBC 3.2* 12/04/2013   HGB 10.9* 12/04/2013   HCT 32.5* 12/04/2013   MCV 96.7 12/04/2013   PLT 62* 12/04/2013   NEUTROABS 1.6 12/04/2013      Chemistry      Component Value Date/Time   NA 142 12/04/2013 0809   NA 139 08/26/2013 0945   K 3.9 12/04/2013 0809   K 3.1* 08/26/2013 0945   CL 101 08/26/2013 0945   CO2 24 12/04/2013 0809   CO2 27 08/26/2013 0945   BUN 16.8 12/04/2013 0809   BUN 8 08/26/2013 0945   CREATININE 1.2* 12/04/2013 0809   CREATININE 0.81 08/26/2013 0945   CREATININE 0.85 07/17/2013 0834      Component Value Date/Time   CALCIUM 9.2 12/04/2013 0809   CALCIUM 8.5 08/26/2013 0945   ALKPHOS 108 12/04/2013 0809   ALKPHOS 59 08/21/2013 1535   AST 28 12/04/2013 0809   AST 20 08/21/2013 1535   ALT 25 12/04/2013 0809   ALT 8 08/21/2013 1535   BILITOT 0.69 12/04/2013 0809   BILITOT 0.6 08/21/2013 1535      Basic Metabolic Panel:  Recent Labs Lab 12/04/13 0809  NA 142  K 3.9  CO2 24  GLUCOSE 96  BUN 16.8  CREATININE 1.2*  CALCIUM 9.2   GFR Estimated Creatinine Clearance: 35.1 ml/min (by C-G formula based on Cr of 1.2). Liver Function Tests:  Recent Labs Lab 12/04/13 0809  AST 28  ALT 25  ALKPHOS 108  BILITOT 0.69  PROT 5.3*  ALBUMIN 3.5   Coagulation profile  Recent Labs Lab 12/04/13 12/04/13 0807  INR 2.6 2.60  PROTIME  --  31.2*    CBC:  Recent Labs Lab 12/04/13 0807  WBC 3.2*  NEUTROABS 1.6  HGB 10.9*  HCT 32.5*  MCV 96.7  PLT 62*   Studies:  No results found.   RADIOGRAPHIC STUDIES: 10/30/2013   CLINICAL DATA: Subsequent treatment strategy for non-Hodgkin's  lymphoma.  EXAM:  NUCLEAR MEDICINE PET SKULL BASE TO THIGH  TECHNIQUE:  6.7 mCi F-18 FDG was injected intravenously. Full-ring PET imaging  was performed from the skull base to thigh after the radiotracer. CT  data was obtained and used for attenuation correction and anatomic  localization.  FASTING BLOOD GLUCOSE: Value: 102 mg/dl  COMPARISON: NM PET IMAGE RESTAG (PS)  SKULL BASE TO THIGH dated  06/16/2013; IR FLUORO GUIDE CV LINE*R* dated 04/17/2013  FINDINGS:  NECK  No hypermetabolic lymph nodes in the neck.  CHEST  The hypermetabolic right axillary lymph node seen on the previous  study is no longer hypermetabolic. The lymph node was measured at 10  x 17 mm on the previous study. It now measures 4 x 5 mm. No  unexpected or suspicious hypermetabolic uptake is identified in the  chest on today's exam.  The tip of the right-sided Port-A-Cath is positioned in the mid  right atrium. .  ABDOMEN/PELVIS  No abnormal hypermetabolic activity within the liver, pancreas,  adrenal glands, or spleen. No hypermetabolic lymph nodes in the  abdomen or pelvis.  The spleen measures 18 cm in craniocaudal length, enlarged. No  hypermetabolism within the splenic parenchyma.  There is a small amount of intraperitoneal free fluid in the pelvis,  of indeterminate etiology.  SKELETON  Diffuse FDG uptake is identified in the marrow spaces. Imaging  features likely are  related to marrow stimulation secondary to  therapy.  IMPRESSION:  Interval resolution of the hypermetabolism identified within the  enlarged right axillary lymph node seen previously. This lymph node  has also decreased markedly in size in the interval.  No new hypermetabolic lesions in the neck, chest, abdomen, or  pelvis.  Stable splenomegaly.  New small amount of pelvic ascites.  PATHOLOGY: 11/19/2013 Bone Marrow, Aspirate,Biopsy, and Clot, right iliac - HYPERCELLULAR BONE MARROW FOR AGE WITH TRILINEAGE HEMATOPOIESIS. - NO TUMOR IDENTIFIED. - SEE COMMENT. PERIPHERAL BLOOD: - PANCYTOPENIA. Diagnosis Wise The bone marrow is hypercellular with trilineage hematopoiesis and non specific changes. In this background, there are a few small interstitial and focally paratrabecular lymphoid aggregates mostly composed of small lymphocytes. A large cell lymphoid component is not identified.  Immunohistochemical stains and flow cytometric studies show that the lymphoid population consists of T cells with no B-cell component identified. Hence, there is no evidence of a B-cell lymphoproliferative process in this material. (BNS:gt,ecj 11/20/13) Susanne Greenhouse MD Pathologist, Electronic Signature (Case signed 11/23/2013)   ASSESSMENT: Tonya Wise 73 y.o. female with a history of Non-Hodgkins lymphoma - Plan: CBC with Differential, CBC with Differential, Lactate dehydrogenase (LDH) - [facility], Comprehensive metabolic panel (Cmet) - [facility]  Other pulmonary embolism and infarction - Plan: CBC with Differential, CBC with Differential, Lactate dehydrogenase (LDH) - [facility], Comprehensive metabolic panel (Cmet) - [facility]   PLAN:  1. 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 -- She continues to do well overall.  Her plts are 62K. Her PET demonstrates a complete response.  her bone marrow biopsy as noted also exclude the presence of lymphoma.  We will start observation with H&P an dlabs, every 3-6 months for 5 year and then yearly or as clinically indicated.  Her CT scan should start every 6 months for 2 years after completion of treatment, then only as clinically indicated.    2. 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 --INR is 2.6  She is being followed by anticoagulation clinic.  Treatment duration is 6 months.  She will end anticoagulation on 12/14/2013.  PE likely secondary to immobilization during recent hospitalizations and active malignancy as noted above.  Continue coumadin and we will plan to hold if Plts less than 50K.   3. 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  --Doxorubicin was discontinued due to worsening congestive heart failure. She is evolemic today.  She was started on lasix prn and has a scheduled follow up with Dr. Claiborne Billings.  4. 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  --We will allow additional count recovery time.   5. 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  --Continue valcyte prophylaxis for now  6.  Follow-up.  -- F/u in 4 weeks for labs.  F/u in 8-9 for labs including cbc, cmp and symptom visit. Port-a-cath flushes every 8 weeks.    All questions were answered. The patient knows to call the clinic with any problems, questions or concerns. We can certainly see the patient much sooner if necessary.  I spent 15 minutes counseling the patient face to face. The total time spent in the appointment was 25 minutes.    Concha Norway, MD 12/04/2013 11:10 AM

## 2013-12-04 NOTE — Progress Notes (Signed)
INR remains at goal of 2-3.  Tonya Wise still has bruising but states it is no worse.  No other changes.  Tonya Wise PE was diagnosed on 06/16/14.  Six months of anticoagulation will be 12/14/13.  She has an appt with Dr. Juliann Mule today and will discuss stopping Coumadin this month.  I have tenatively scheduled a coumadin clinic appt in 1 month if needed.  Tonya Wise will continue current coumadin dose of 6mg  daily.  Coumadin 6mg  x 10 samples given: EYC#[id number] Exp 02/2015  I spoke with Dr. Juliann Mule.  He has instructed Tonya Wise to take coumadin thru 12/14/13 then can stop.  It was a pleasure seeing Tonya Wise in coumadin clinic.

## 2013-12-05 ENCOUNTER — Telehealth: Payer: Self-pay | Admitting: Internal Medicine

## 2013-12-05 NOTE — Telephone Encounter (Signed)
called pt and left message for appt for June and july 2015

## 2013-12-05 NOTE — Telephone Encounter (Signed)
Mailed appt to pt.

## 2013-12-08 ENCOUNTER — Encounter: Payer: Self-pay | Admitting: Family Medicine

## 2013-12-08 ENCOUNTER — Ambulatory Visit (INDEPENDENT_AMBULATORY_CARE_PROVIDER_SITE_OTHER): Payer: Medicare Other | Admitting: Family Medicine

## 2013-12-08 ENCOUNTER — Encounter: Payer: Medicare Other | Admitting: Physical Therapy

## 2013-12-08 VITALS — BP 150/90 | HR 78 | Temp 97.8°F | Wt 122.0 lb

## 2013-12-08 DIAGNOSIS — R197 Diarrhea, unspecified: Secondary | ICD-10-CM

## 2013-12-08 DIAGNOSIS — C8590 Non-Hodgkin lymphoma, unspecified, unspecified site: Secondary | ICD-10-CM

## 2013-12-08 DIAGNOSIS — C8589 Other specified types of non-Hodgkin lymphoma, extranodal and solid organ sites: Secondary | ICD-10-CM

## 2013-12-08 NOTE — Progress Notes (Signed)
   Subjective:    Patient ID: Tonya Wise, female    DOB: 09-15-1940, 73 y.o.   MRN: [id number]  HPI She is here for evaluation of intermittent diarrhea for the last 2 months. She finished chemotherapy approximately one month ago. She had a bone marrow on April 23. Blood work was done on May 8 and reviewed. The stools have been loose for 2 months. She can go a day or 2 between stools but they're always loose to the stools are loose and watery with abdominal cramping. Review of the history indicates she was given an antibiotic in late January for treatment of presumed pneumonia.   Review of Systems     Objective:   Physical Exam alert and in no distress. Tympanic membranes and canals are normal. Throat is clear. Tonsils are normal. Neck is supple without adenopathy or thyromegaly. Cardiac exam shows a regular sinus rhythm without murmurs or gallops. Lungs are clear to auscultation. Abdominal exam shows decreased bowel sounds without masses or tenderness        Assessment & Plan:  Diarrhea  Non-Hodgkins lymphoma  I will order stool for ova and parasites, culture, lactoferrin, C. difficile toxin x2. My first thought is this is C. difficile colitis.

## 2013-12-09 ENCOUNTER — Other Ambulatory Visit: Payer: Self-pay | Admitting: Family Medicine

## 2013-12-09 DIAGNOSIS — R197 Diarrhea, unspecified: Secondary | ICD-10-CM | POA: Diagnosis not present

## 2013-12-09 DIAGNOSIS — C8589 Other specified types of non-Hodgkin lymphoma, extranodal and solid organ sites: Secondary | ICD-10-CM | POA: Diagnosis not present

## 2013-12-10 ENCOUNTER — Other Ambulatory Visit: Payer: Self-pay | Admitting: Family Medicine

## 2013-12-10 ENCOUNTER — Ambulatory Visit: Payer: Medicare Other | Admitting: Physical Therapy

## 2013-12-10 DIAGNOSIS — IMO0001 Reserved for inherently not codable concepts without codable children: Secondary | ICD-10-CM | POA: Diagnosis not present

## 2013-12-10 DIAGNOSIS — R197 Diarrhea, unspecified: Secondary | ICD-10-CM | POA: Diagnosis not present

## 2013-12-10 DIAGNOSIS — C8589 Other specified types of non-Hodgkin lymphoma, extranodal and solid organ sites: Secondary | ICD-10-CM | POA: Diagnosis not present

## 2013-12-11 ENCOUNTER — Telehealth: Payer: Self-pay

## 2013-12-11 ENCOUNTER — Other Ambulatory Visit: Payer: Self-pay | Admitting: Internal Medicine

## 2013-12-11 DIAGNOSIS — E876 Hypokalemia: Secondary | ICD-10-CM

## 2013-12-11 LAB — OVA AND PARASITE EXAMINATION
OP: NONE SEEN
OP: NONE SEEN

## 2013-12-11 LAB — C. DIFFICILE GDH AND TOXIN A/B
C. DIFF TOXIN A/B: NOT DETECTED
C. DIFFICILE GDH: NOT DETECTED
C. difficile GDH: NOT DETECTED
C. difficile Toxin A/B: NOT DETECTED

## 2013-12-11 LAB — FECAL LACTOFERRIN, QUANT
LACTOFERRIN: POSITIVE
Lactoferrin: POSITIVE

## 2013-12-11 MED ORDER — POTASSIUM CHLORIDE CRYS ER 10 MEQ PO TBCR: 10.0000 meq | EXTENDED_RELEASE_TABLET | Freq: Every morning | ORAL | Status: DC

## 2013-12-11 NOTE — Telephone Encounter (Signed)
Pt called earlier asking if she needs to continue her potassium. S/w Dr Juliann Mule and he said to continue potassium. Pt informed.

## 2013-12-14 LAB — STOOL CULTURE

## 2013-12-15 ENCOUNTER — Ambulatory Visit: Payer: Medicare Other | Admitting: Physical Therapy

## 2013-12-15 DIAGNOSIS — IMO0001 Reserved for inherently not codable concepts without codable children: Secondary | ICD-10-CM | POA: Diagnosis not present

## 2013-12-17 ENCOUNTER — Telehealth: Payer: Self-pay | Admitting: Medical Oncology

## 2013-12-17 ENCOUNTER — Encounter: Payer: Self-pay | Admitting: Medical Oncology

## 2013-12-17 ENCOUNTER — Ambulatory Visit: Payer: Medicare Other | Admitting: Physical Therapy

## 2013-12-17 ENCOUNTER — Other Ambulatory Visit: Payer: Self-pay | Admitting: Internal Medicine

## 2013-12-17 DIAGNOSIS — IMO0001 Reserved for inherently not codable concepts without codable children: Secondary | ICD-10-CM | POA: Diagnosis not present

## 2013-12-17 MED ORDER — ACYCLOVIR 400 MG PO TABS: 400.0000 mg | ORAL_TABLET | Freq: Every day | ORAL | Status: DC

## 2013-12-17 NOTE — Telephone Encounter (Signed)
Pt called stating that her valtrex now cost $94 a month. The pharmacist suggested acylovir in its place. Per Dr. Consuello Masse is fine because he plans to discontinue when her counts are fully recovered.  New prescription sent to [facility].

## 2013-12-25 ENCOUNTER — Telehealth: Payer: Self-pay | Admitting: Family Medicine

## 2013-12-25 NOTE — Telephone Encounter (Signed)
I am uncomfortable calling an antibiotic and and recommend she go to an ER or urgent care for an evaluation

## 2013-12-25 NOTE — Telephone Encounter (Signed)
Pt.notified

## 2013-12-25 NOTE — Telephone Encounter (Signed)
Pt out of town @ [place] and feel that she has pneumonia again. Has exact same symptoms that she had in Jan. when she had pneumonia. Has cough, wheezing. Paramedics said she has rattliing in her right lung. Pt tried to go to Urgent Care yesterday but it was closed. Can Dr Redmond School call in med to [facility]?

## 2013-12-28 ENCOUNTER — Ambulatory Visit
Admission: RE | Admit: 2013-12-28 | Discharge: 2013-12-28 | Disposition: A | Discharge status: Home / Self Care | Payer: Medicare Other | Source: Ambulatory Visit | Attending: Medical | Admitting: Medical

## 2013-12-28 ENCOUNTER — Encounter: Payer: Self-pay | Admitting: Medical

## 2013-12-28 ENCOUNTER — Ambulatory Visit (INDEPENDENT_AMBULATORY_CARE_PROVIDER_SITE_OTHER): Payer: Medicare Other | Admitting: Medical

## 2013-12-28 VITALS — BP 142/80 | HR 66 | Temp 98.1°F | Resp 16 | Wt 121.0 lb

## 2013-12-28 DIAGNOSIS — R05 Cough: Secondary | ICD-10-CM | POA: Diagnosis not present

## 2013-12-28 DIAGNOSIS — R059 Cough, unspecified: Secondary | ICD-10-CM | POA: Diagnosis not present

## 2013-12-28 DIAGNOSIS — R062 Wheezing: Secondary | ICD-10-CM

## 2013-12-28 DIAGNOSIS — C8590 Non-Hodgkin lymphoma, unspecified, unspecified site: Secondary | ICD-10-CM

## 2013-12-28 DIAGNOSIS — R0602 Shortness of breath: Secondary | ICD-10-CM | POA: Diagnosis not present

## 2013-12-28 DIAGNOSIS — C8589 Other specified types of non-Hodgkin lymphoma, extranodal and solid organ sites: Secondary | ICD-10-CM | POA: Diagnosis not present

## 2013-12-28 MED ORDER — HYDROCODONE-HOMATROPINE 5-1.5 MG/5ML PO SYRP: 5.0000 mL | ORAL_SOLUTION | Freq: Four times a day (QID) | ORAL | Status: DC | PRN

## 2013-12-28 NOTE — Progress Notes (Signed)
Subjective:  Tonya Wise is a 73 y.o. female who presents for cough and not feeling well .   Was [place] the beach this past [place], and been feeling bad.  Her friend paramedic heard rattling in right lung.  She tried to go to urgent care, but 2 different Urgent Cares were closing when she walked in, couldn't be seen. Currently having lethargy, can't keep eyes open, coughing her head off, nose running, achy in general.  coughing a lot, had some left over cough syrup hydrocodone she has used some.  Been feeling bad 5 days+.  Had LGF at the beach.  Has felt SOB and wheezing.   Has had sore throat, head congestion, feels heavy in chest.   She is nonsmoker, but smoked 50 years ago.  Denies nausea, and vomiting.  Has ongoing diarrhea from when she saw Dr. Redmond School, rule out for C. Diff.  Other than the beach, no other long travel.  Just finished chemotherapy in April for lymphoma.  No other aggravating or relieving factors.  No other c/o.  The following portions of the patient's history were reviewed and updated as appropriate: allergies, current medications, past family history, past medical history, past social history, past surgical history and problem list.  ROS as in subjective  Past Medical History  Diagnosis Date  . Hypertension   . Renal insufficiency   . Osteopenia   . Allergy   . Heart murmur   . GERD (gastroesophageal reflux disease)   . Anemia   . Hypercalcemia 03/27/2013  . Non-Hodgkins lymphoma 04/06/2013  . Diastolic dysfunction, grade I 04/12/2013     Objective: BP 142/80  Pulse 66  Temp(Src) 98.1 F (36.7 C) (Oral)  Resp 16  Wt 121 lb (54.885 kg)   General appearance: Alert, WD/WN, no distress                             Skin: warm, no rash, no diaphoresis                           Head: no sinus tenderness                            Eyes: conjunctiva normal, corneas clear, PERRLA                            Ears: pearly TMs, external ear canals normal  Nose: septum midline, turbinates normal, no discharge or erythema             Mouth/throat: MMM, tongue normal, no pharyngeal erythema                           Neck: supple, no adenopathy, no thyromegaly, nontender                          Heart: RRR, normal S1, S2, no murmurs                         Lungs: faint crackles riht lower field, somewhat dull on percussion, otherwise no wheezes, no rhonchi                Extremities: no edema, nontender     Assessment: Encounter Diagnoses  Name Primary?  . Cough Yes  . SOB (shortness of breath)   . Wheezing   . Non-Hodgkins lymphoma     Plan:  discussed her symptoms, findings.  Refilled Hycodan for cough, discussed possible causes, will send for CXR.   Offered labs, but she declines, is having labs through oncology office this week

## 2013-12-29 ENCOUNTER — Ambulatory Visit: Payer: Medicare Other

## 2013-12-29 ENCOUNTER — Telehealth: Payer: Self-pay | Admitting: Medical

## 2013-12-29 NOTE — Telephone Encounter (Signed)
In reviewing pt's chart it wasn't AVS that was incorrect it was Radiology had noted pt was smoker.  An addendum is being done by Radiology Dr. And I left a message for pt that Mychart was being corrected

## 2013-12-29 NOTE — Telephone Encounter (Signed)
Audelia Acton when you receive the amended result from Radiology please release it. Thanks

## 2013-12-31 ENCOUNTER — Encounter: Payer: Medicare Other | Admitting: Physical Therapy

## 2014-01-01 ENCOUNTER — Other Ambulatory Visit (HOSPITAL_BASED_OUTPATIENT_CLINIC_OR_DEPARTMENT_OTHER): Payer: Medicare Other

## 2014-01-01 ENCOUNTER — Other Ambulatory Visit: Payer: Medicare Other

## 2014-01-01 DIAGNOSIS — C8589 Other specified types of non-Hodgkin lymphoma, extranodal and solid organ sites: Secondary | ICD-10-CM

## 2014-01-01 DIAGNOSIS — I2699 Other pulmonary embolism without acute cor pulmonale: Secondary | ICD-10-CM

## 2014-01-01 DIAGNOSIS — C8590 Non-Hodgkin lymphoma, unspecified, unspecified site: Secondary | ICD-10-CM

## 2014-01-01 LAB — CBC WITH DIFFERENTIAL/PLATELET
BASO%: 0.3 % (ref 0.0–2.0)
Basophils Absolute: 0 10*3/uL (ref 0.0–0.1)
EOS%: 1.7 % (ref 0.0–7.0)
Eosinophils Absolute: 0.1 10*3/uL (ref 0.0–0.5)
HCT: 37.9 % (ref 34.8–46.6)
HGB: 12.5 g/dL (ref 11.6–15.9)
LYMPH#: 1.5 10*3/uL (ref 0.9–3.3)
LYMPH%: 43.6 % (ref 14.0–49.7)
MCH: 30.2 pg (ref 25.1–34.0)
MCHC: 33 g/dL (ref 31.5–36.0)
MCV: 91.5 fL (ref 79.5–101.0)
MONO#: 0.3 10*3/uL (ref 0.1–0.9)
MONO%: 9.8 % (ref 0.0–14.0)
NEUT#: 1.5 10*3/uL (ref 1.5–6.5)
NEUT%: 44.6 % (ref 38.4–76.8)
Platelets: 70 10*3/uL — ABNORMAL LOW (ref 145–400)
RBC: 4.14 10*6/uL (ref 3.70–5.45)
RDW: 13.4 % (ref 11.2–14.5)
WBC: 3.5 10*3/uL — AB (ref 3.9–10.3)

## 2014-01-05 ENCOUNTER — Ambulatory Visit: Payer: Medicare Other | Attending: Internal Medicine

## 2014-01-05 DIAGNOSIS — M129 Arthropathy, unspecified: Secondary | ICD-10-CM | POA: Diagnosis not present

## 2014-01-05 DIAGNOSIS — E876 Hypokalemia: Secondary | ICD-10-CM | POA: Insufficient documentation

## 2014-01-05 DIAGNOSIS — C8589 Other specified types of non-Hodgkin lymphoma, extranodal and solid organ sites: Secondary | ICD-10-CM | POA: Insufficient documentation

## 2014-01-05 DIAGNOSIS — Z7901 Long term (current) use of anticoagulants: Secondary | ICD-10-CM | POA: Diagnosis not present

## 2014-01-05 DIAGNOSIS — I2699 Other pulmonary embolism without acute cor pulmonale: Secondary | ICD-10-CM | POA: Diagnosis not present

## 2014-01-05 DIAGNOSIS — Z8673 Personal history of transient ischemic attack (TIA), and cerebral infarction without residual deficits: Secondary | ICD-10-CM | POA: Insufficient documentation

## 2014-01-05 DIAGNOSIS — IMO0001 Reserved for inherently not codable concepts without codable children: Secondary | ICD-10-CM | POA: Diagnosis not present

## 2014-01-05 DIAGNOSIS — Z79899 Other long term (current) drug therapy: Secondary | ICD-10-CM | POA: Diagnosis not present

## 2014-01-05 DIAGNOSIS — R197 Diarrhea, unspecified: Secondary | ICD-10-CM | POA: Diagnosis not present

## 2014-01-05 DIAGNOSIS — I2782 Chronic pulmonary embolism: Secondary | ICD-10-CM | POA: Insufficient documentation

## 2014-01-08 ENCOUNTER — Ambulatory Visit: Payer: Medicare Other | Admitting: Physical Therapy

## 2014-01-08 DIAGNOSIS — C8589 Other specified types of non-Hodgkin lymphoma, extranodal and solid organ sites: Secondary | ICD-10-CM | POA: Diagnosis not present

## 2014-01-08 DIAGNOSIS — I2699 Other pulmonary embolism without acute cor pulmonale: Secondary | ICD-10-CM | POA: Diagnosis not present

## 2014-01-08 DIAGNOSIS — Z79899 Other long term (current) drug therapy: Secondary | ICD-10-CM | POA: Diagnosis not present

## 2014-01-08 DIAGNOSIS — E876 Hypokalemia: Secondary | ICD-10-CM | POA: Diagnosis not present

## 2014-01-08 DIAGNOSIS — IMO0001 Reserved for inherently not codable concepts without codable children: Secondary | ICD-10-CM | POA: Diagnosis not present

## 2014-01-08 DIAGNOSIS — R197 Diarrhea, unspecified: Secondary | ICD-10-CM | POA: Diagnosis not present

## 2014-01-12 ENCOUNTER — Ambulatory Visit: Payer: Medicare Other

## 2014-01-12 DIAGNOSIS — C8589 Other specified types of non-Hodgkin lymphoma, extranodal and solid organ sites: Secondary | ICD-10-CM | POA: Diagnosis not present

## 2014-01-12 DIAGNOSIS — E876 Hypokalemia: Secondary | ICD-10-CM | POA: Diagnosis not present

## 2014-01-12 DIAGNOSIS — I2699 Other pulmonary embolism without acute cor pulmonale: Secondary | ICD-10-CM | POA: Diagnosis not present

## 2014-01-12 DIAGNOSIS — IMO0001 Reserved for inherently not codable concepts without codable children: Secondary | ICD-10-CM | POA: Diagnosis not present

## 2014-01-12 DIAGNOSIS — Z79899 Other long term (current) drug therapy: Secondary | ICD-10-CM | POA: Diagnosis not present

## 2014-01-12 DIAGNOSIS — R197 Diarrhea, unspecified: Secondary | ICD-10-CM | POA: Diagnosis not present

## 2014-01-15 ENCOUNTER — Ambulatory Visit: Payer: Medicare Other | Admitting: Physical Therapy

## 2014-01-15 DIAGNOSIS — C8589 Other specified types of non-Hodgkin lymphoma, extranodal and solid organ sites: Secondary | ICD-10-CM | POA: Diagnosis not present

## 2014-01-15 DIAGNOSIS — R197 Diarrhea, unspecified: Secondary | ICD-10-CM | POA: Diagnosis not present

## 2014-01-15 DIAGNOSIS — I2699 Other pulmonary embolism without acute cor pulmonale: Secondary | ICD-10-CM | POA: Diagnosis not present

## 2014-01-15 DIAGNOSIS — E876 Hypokalemia: Secondary | ICD-10-CM | POA: Diagnosis not present

## 2014-01-15 DIAGNOSIS — IMO0001 Reserved for inherently not codable concepts without codable children: Secondary | ICD-10-CM | POA: Diagnosis not present

## 2014-01-15 DIAGNOSIS — Z79899 Other long term (current) drug therapy: Secondary | ICD-10-CM | POA: Diagnosis not present

## 2014-01-19 ENCOUNTER — Encounter: Payer: Medicare Other | Admitting: Physical Therapy

## 2014-01-22 ENCOUNTER — Ambulatory Visit: Payer: Medicare Other | Admitting: Physical Therapy

## 2014-01-22 DIAGNOSIS — IMO0001 Reserved for inherently not codable concepts without codable children: Secondary | ICD-10-CM | POA: Diagnosis not present

## 2014-01-22 DIAGNOSIS — E876 Hypokalemia: Secondary | ICD-10-CM | POA: Diagnosis not present

## 2014-01-22 DIAGNOSIS — R197 Diarrhea, unspecified: Secondary | ICD-10-CM | POA: Diagnosis not present

## 2014-01-22 DIAGNOSIS — I2699 Other pulmonary embolism without acute cor pulmonale: Secondary | ICD-10-CM | POA: Diagnosis not present

## 2014-01-22 DIAGNOSIS — Z79899 Other long term (current) drug therapy: Secondary | ICD-10-CM | POA: Diagnosis not present

## 2014-01-22 DIAGNOSIS — C8589 Other specified types of non-Hodgkin lymphoma, extranodal and solid organ sites: Secondary | ICD-10-CM | POA: Diagnosis not present

## 2014-01-26 ENCOUNTER — Ambulatory Visit: Payer: Medicare Other

## 2014-01-26 DIAGNOSIS — E876 Hypokalemia: Secondary | ICD-10-CM | POA: Diagnosis not present

## 2014-01-26 DIAGNOSIS — Z79899 Other long term (current) drug therapy: Secondary | ICD-10-CM | POA: Diagnosis not present

## 2014-01-26 DIAGNOSIS — R197 Diarrhea, unspecified: Secondary | ICD-10-CM | POA: Diagnosis not present

## 2014-01-26 DIAGNOSIS — I2699 Other pulmonary embolism without acute cor pulmonale: Secondary | ICD-10-CM | POA: Diagnosis not present

## 2014-01-26 DIAGNOSIS — C8589 Other specified types of non-Hodgkin lymphoma, extranodal and solid organ sites: Secondary | ICD-10-CM | POA: Diagnosis not present

## 2014-01-26 DIAGNOSIS — IMO0001 Reserved for inherently not codable concepts without codable children: Secondary | ICD-10-CM | POA: Diagnosis not present

## 2014-01-28 ENCOUNTER — Ambulatory Visit: Payer: Medicare Other | Attending: Internal Medicine | Admitting: Physical Therapy

## 2014-01-28 DIAGNOSIS — IMO0001 Reserved for inherently not codable concepts without codable children: Secondary | ICD-10-CM | POA: Diagnosis not present

## 2014-01-28 DIAGNOSIS — I2782 Chronic pulmonary embolism: Secondary | ICD-10-CM | POA: Diagnosis not present

## 2014-01-28 DIAGNOSIS — I2699 Other pulmonary embolism without acute cor pulmonale: Secondary | ICD-10-CM | POA: Insufficient documentation

## 2014-01-28 DIAGNOSIS — Z7901 Long term (current) use of anticoagulants: Secondary | ICD-10-CM | POA: Diagnosis not present

## 2014-01-28 DIAGNOSIS — Z8673 Personal history of transient ischemic attack (TIA), and cerebral infarction without residual deficits: Secondary | ICD-10-CM | POA: Diagnosis not present

## 2014-01-28 DIAGNOSIS — M129 Arthropathy, unspecified: Secondary | ICD-10-CM | POA: Insufficient documentation

## 2014-01-28 DIAGNOSIS — Z79899 Other long term (current) drug therapy: Secondary | ICD-10-CM | POA: Diagnosis not present

## 2014-01-28 DIAGNOSIS — R197 Diarrhea, unspecified: Secondary | ICD-10-CM | POA: Diagnosis not present

## 2014-01-28 DIAGNOSIS — C8589 Other specified types of non-Hodgkin lymphoma, extranodal and solid organ sites: Secondary | ICD-10-CM | POA: Insufficient documentation

## 2014-01-28 DIAGNOSIS — E876 Hypokalemia: Secondary | ICD-10-CM | POA: Diagnosis not present

## 2014-02-02 ENCOUNTER — Ambulatory Visit: Payer: Medicare Other

## 2014-02-02 DIAGNOSIS — IMO0001 Reserved for inherently not codable concepts without codable children: Secondary | ICD-10-CM | POA: Diagnosis not present

## 2014-02-04 ENCOUNTER — Encounter: Payer: Medicare Other | Admitting: Physical Therapy

## 2014-02-05 ENCOUNTER — Ambulatory Visit (HOSPITAL_BASED_OUTPATIENT_CLINIC_OR_DEPARTMENT_OTHER): Payer: Medicare Other | Admitting: Internal Medicine

## 2014-02-05 ENCOUNTER — Ambulatory Visit (HOSPITAL_BASED_OUTPATIENT_CLINIC_OR_DEPARTMENT_OTHER): Payer: Medicare Other

## 2014-02-05 ENCOUNTER — Other Ambulatory Visit (HOSPITAL_BASED_OUTPATIENT_CLINIC_OR_DEPARTMENT_OTHER): Payer: Medicare Other

## 2014-02-05 VITALS — BP 165/87 | HR 77 | Temp 97.6°F | Resp 18 | Ht 63.0 in | Wt 124.4 lb

## 2014-02-05 DIAGNOSIS — C8589 Other specified types of non-Hodgkin lymphoma, extranodal and solid organ sites: Secondary | ICD-10-CM

## 2014-02-05 DIAGNOSIS — I5040 Unspecified combined systolic (congestive) and diastolic (congestive) heart failure: Secondary | ICD-10-CM

## 2014-02-05 DIAGNOSIS — Z86718 Personal history of other venous thrombosis and embolism: Secondary | ICD-10-CM | POA: Diagnosis not present

## 2014-02-05 DIAGNOSIS — I509 Heart failure, unspecified: Secondary | ICD-10-CM

## 2014-02-05 DIAGNOSIS — C8590 Non-Hodgkin lymphoma, unspecified, unspecified site: Secondary | ICD-10-CM

## 2014-02-05 DIAGNOSIS — I2699 Other pulmonary embolism without acute cor pulmonale: Secondary | ICD-10-CM

## 2014-02-05 DIAGNOSIS — Z95828 Presence of other vascular implants and grafts: Secondary | ICD-10-CM

## 2014-02-05 LAB — COMPREHENSIVE METABOLIC PANEL (CC13)
ALT: 21 U/L (ref 0–55)
ANION GAP: 9 meq/L (ref 3–11)
AST: 19 U/L (ref 5–34)
Albumin: 3.7 g/dL (ref 3.5–5.0)
Alkaline Phosphatase: 81 U/L (ref 40–150)
BUN: 26.6 mg/dL — ABNORMAL HIGH (ref 7.0–26.0)
CHLORIDE: 111 meq/L — AB (ref 98–109)
CO2: 22 mEq/L (ref 22–29)
CREATININE: 1.1 mg/dL (ref 0.6–1.1)
Calcium: 9.1 mg/dL (ref 8.4–10.4)
Glucose: 90 mg/dl (ref 70–140)
Potassium: 4.3 mEq/L (ref 3.5–5.1)
SODIUM: 142 meq/L (ref 136–145)
TOTAL PROTEIN: 5.6 g/dL — AB (ref 6.4–8.3)
Total Bilirubin: 0.51 mg/dL (ref 0.20–1.20)

## 2014-02-05 LAB — CBC WITH DIFFERENTIAL/PLATELET
BASO%: 0.8 % (ref 0.0–2.0)
Basophils Absolute: 0 10*3/uL (ref 0.0–0.1)
EOS%: 1.6 % (ref 0.0–7.0)
Eosinophils Absolute: 0.1 10*3/uL (ref 0.0–0.5)
HCT: 36.2 % (ref 34.8–46.6)
HEMOGLOBIN: 12.3 g/dL (ref 11.6–15.9)
LYMPH%: 38.4 % (ref 14.0–49.7)
MCH: 29.8 pg (ref 25.1–34.0)
MCHC: 34 g/dL (ref 31.5–36.0)
MCV: 87.7 fL (ref 79.5–101.0)
MONO#: 0.5 10*3/uL (ref 0.1–0.9)
MONO%: 10.2 % (ref 0.0–14.0)
NEUT#: 2.2 10*3/uL (ref 1.5–6.5)
NEUT%: 49 % (ref 38.4–76.8)
Platelets: 102 10*3/uL — ABNORMAL LOW (ref 145–400)
RBC: 4.13 10*6/uL (ref 3.70–5.45)
RDW: 14.3 % (ref 11.2–14.5)
WBC: 4.5 10*3/uL (ref 3.9–10.3)
lymph#: 1.7 10*3/uL (ref 0.9–3.3)

## 2014-02-05 LAB — LACTATE DEHYDROGENASE (CC13): LDH: 207 U/L (ref 125–245)

## 2014-02-05 MED ORDER — HEPARIN SOD (PORK) LOCK FLUSH 100 UNIT/ML IV SOLN
500.0000 [IU] | Freq: Once | INTRAVENOUS | Status: AC
  Administered 2014-02-05: 500 [IU] via INTRAVENOUS
  Filled 2014-02-05: qty 5

## 2014-02-05 MED ORDER — SODIUM CHLORIDE 0.9 % IJ SOLN
10.0000 mL | INTRAMUSCULAR | Status: DC | PRN
  Administered 2014-02-05: 10 mL via INTRAVENOUS
  Filled 2014-02-05: qty 10

## 2014-02-05 NOTE — Patient Instructions (Signed)

## 2014-02-05 NOTE — Progress Notes (Signed)
Tonya Wise NOTE  Tonya Haste, MD Tonya Wise [place] [address]  DIAGNOSIS: Non-Hodgkins lymphoma - Plan: CBC with Differential, Comprehensive metabolic panel (Cmet) - [facility], Lactate dehydrogenase (LDH) - [facility], CT Chest Wo Contrast, CT Abdomen Wo Contrast  Chief Complaint  Patient presents with  . Follow-up      Non-Hodgkins lymphoma   03/27/2013 - 03/31/2013 Hospital Admission Admitted to [facility] for profound anemia (Hgb 6.4).  C/o fevers, drenching nightsweats, weight lost.  Negative colonoscopy and EGD.  CT of C/A/Wise demonstrated significant lymphadenopathy suspcious of ympha.     03/31/2013 Initial Diagnosis Biopsy of R inguinal lymph node revealed Non-Hodgkins lymphoma. Diffuse Large B-cell Lymphoma   04/10/2013 - 04/24/2013 Hospital Admission Hypercalcemia, renal failure and dehydration.  Started min-RCHOP.    04/15/2013 Bone Marrow Biopsy Involvement with Diffuse Large B-cell Lymphoma. Stage IV NHL w B-symptoms.    04/18/2013 - 04/18/2013 Chemotherapy Mini R-CHOP Cycle #1 on 04/18/2013. Dose reduced to poor nutrion. It consisted on Cytoxan 400 mg/m2,Doxorubicin 25 mg/m2, Rituximab 375 mg/m2, Vincristine 60m.  Prednisone 100 mg daily for 9/13 - 9/16.   Received neupogen on 9/21, 9/27 -9/30.     04/25/2013 - 04/29/2013 Hospital Admission Admitted due to worsening mouth sores/oral thrush, odynophagia, failure to thrive.  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  Discharge to Tonya Wise(Dr. CGwynneth Munson and discharged to home on 05/19/2013.     06/01/2013 - 06/01/2013 Chemotherapy Mini R-CHO #2 dosed as above.  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  Received neulasta shot on 06/02/13   06/16/2013 Cancer Staging Re-staging PET/CT demonstrated significant interval reduction in axillary lymphadenopathy.  There was 1 small residual right axillary lymph node measuring 16 x 12  mm which showed FDG uptake.  Signifcant reduction in mediastinal and hilar lymphadenopathy.    06/16/2013 Imaging CT of chest noted left-sided pulmonary embolim. Started on lovenox to coumadin bridge.     06/22/2013 - 06/22/2013 Chemotherapy Mini-R-CHO #3.  Received Neulasta on 06/23/2013.     07/16/2013 Imaging 2-D Echocardiogram demonstrates 35-45% EF with Grade 2 diastolic dysfunction.  Doxorubicin discontinued with consultation by cardiology (Dr. KClaiborne Billings.     08/19/2013 - 08/21/2013 Chemotherapy Mini-R-CEO #4.  Etoposide 25 mg/m2 instead of doxo due to above.  Etoposide 536mbid on day 2/3.  On day 3 (08/21/2013) she received 1 of two oral doses of ectoposide.    08/21/2013 - 08/26/2013 [facility] and had influenzae A and pneumonia. Completed tamiflu and oral antibiotics.    09/25/2013 - 09/27/2013 Chemotherapy Mini-R-CEO #5. Etoposide 25 mg/m2 25 mg bid on days 2/3.    10/16/2013 - 10/19/2013 Chemotherapy Mini-R-CEO # 6. Etoposide 25 mg/m2 bid on days 2/3. Neulasta on day#4.    11/05/2013 Imaging Interval resolution of the hypermetabolism identified within the enlarged right axillary lymph node seen previously. This lymph node has also decreased markedly in size in the interval. No new hypermetabolic lesions in the neck, chest, abdomen, or  pelvis.    11/19/2013 Bone Marrow Biopsy There is no evidence of a B-cell lymphoproliferative process in this material. Normal cytogenetics.    INTERVAL HISTORY: [name].o. female with the above is here for follow-up.  She was last seen by me on 12/04/2013.    She denies any fevers.  She reports that her appetite has improved.  She still complains of mild tingling of her tongue  making it hard to tolerate some foods especially sweets.   She presents along with her husband [name]. She reports improvement in her cough. She denies melena or hematochezia.    MEDICAL HISTORY: Past Medical History   Diagnosis Date  . Hypertension   . Renal insufficiency   . Osteopenia   . Allergy   . Heart murmur   . GERD (gastroesophageal reflux disease)   . Anemia   . Hypercalcemia 03/27/2013  . Non-Hodgkins lymphoma 04/06/2013  . Diastolic dysfunction, grade I 04/12/2013    INTERIM HISTORY: has Hypercalcemia; Non-Hodgkins lymphoma; Elevated LFTs; Protein-calorie malnutrition, severe; Anemia of chronic disease; Acute on chronic combined systolic and diastolic heart failure; Other pulmonary embolism and infarction; Acute on chronic cough; Dysphagia, unspecified(787.20); Cardiomyopathy; Diastolic dysfunction grade 2; PNA (pneumonia); Influenza with pneumonia; Thrombocytopenia, unspecified; Acute respiratory failure with hypoxia secondary to influenza A with pneumonia; UTI (urinary tract infection); History of pulmonary embolism; HTN (hypertension); Hypokalemia; and Thrush on her problem list.    ALLERGIES:  is allergic to prednisone and penicillins.  MEDICATIONS: has a current medication list which includes the following prescription(s): acyclovir, hydrocodone-homatropine, lidocaine-prilocaine, losartan, metoprolol tartrate, omeprazole, and potassium chloride.  SURGICAL HISTORY:  Past Surgical History  Procedure Laterality Date  . Appendectomy    . Colonoscopy  2008  . Spine surgery    . Back surgery      LOWER BACK TWICE  . Abdominal hysterectomy    . Esophagogastroduodenoscopy N/A 03/29/2013    Procedure: ESOPHAGOGASTRODUODENOSCOPY (EGD);  Surgeon: Juanita Craver, MD;  Location: [facility] ENDOSCOPY;  Service: Endoscopy;  Laterality: N/A;  . Inguinal lymph node biopsy Right 03/31/2013    Procedure: INGUINAL LYMPH NODE BIOPSY;  Surgeon: Gwenyth Ober, MD;  Location: [facility];  Service: General;  Laterality: Right;    REVIEW OF SYSTEMS:   Constitutional: Denies fevers, chills; She continues to have decreased in her taste.  Eyes: Denies blurriness of vision Ears, nose, mouth, throat, and face:  Respiratory:  Denies cough, dyspnea or wheezes Cardiovascular: Denies palpitation, chest discomfort or lower extremity swelling Gastrointestinal:  Denies nausea, heartburn or change in bowel habits; abdominal discomfort as noted above.  Skin: Denies abnormal skin rashes Lymphatics: Denies new lymphadenopathy or easy bruising Neurological:Denies numbness, tingling or new weaknesses Behavioral/Psych: Mood is stable, no new changes  All other systems were reviewed with the patient and are negative.  PHYSICAL EXAMINATION: ECOG PERFORMANCE STATUS: 0 - Asymptomatic  Blood pressure 165/87, pulse 77, temperature 97.6 F (36.4 C), temperature source Oral, resp. rate 18, height '5\' 3"'  (1.6 m), weight 124 lb 6.4 oz (56.427 kg), SpO2 99.00%.  GENERAL:alert, no distress and comfortable; easily ambulatory.  SKIN: skin color, texture, turgor are normal, no rashes or significant lesions.  EYES: normal, Conjunctiva are pink and non-injected, sclera clear OROPHARYNX:no exudate, no erythema and lips, buccal mucosa, and tongue with smooth surface suggestive of recovering mucositis.    NECK: supple, thyroid normal size, non-tender, without nodularity LYMPH:  no palpable lymphadenopathy in the cervical, axillary or supraclavicular LUNGS: clear to auscultation and percussion with normal breathing effort HEART: regular rate & rhythm and no murmurs and no lower extremity edema ABDOMEN:abdomen soft, non-tender and normal bowel sounds Musculoskeletal:no cyanosis of digits and no clubbing  NEURO: alert & oriented x 3 with fluent speech, no focal motor/sensory deficits  Labs:  Lab Results  Component Value Date   WBC 4.5 02/05/2014   HGB 12.3 02/05/2014   HCT 36.2 02/05/2014   MCV 87.7 02/05/2014   PLT 102*  02/05/2014   NEUTROABS 2.2 02/05/2014      Chemistry      Component Value Date/Time   NA 142 02/05/2014 1430   NA 139 08/26/2013 0945   K 4.3 02/05/2014 1430   K 3.1* 08/26/2013 0945   CL 101 08/26/2013 0945   CO2 22  02/05/2014 1430   CO2 27 08/26/2013 0945   BUN 26.6* 02/05/2014 1430   BUN 8 08/26/2013 0945   CREATININE 1.1 02/05/2014 1430   CREATININE 0.81 08/26/2013 0945   CREATININE 0.85 07/17/2013 0834      Component Value Date/Time   CALCIUM 9.1 02/05/2014 1430   CALCIUM 8.5 08/26/2013 0945   ALKPHOS 81 02/05/2014 1430   ALKPHOS 59 08/21/2013 1535   AST 19 02/05/2014 1430   AST 20 08/21/2013 1535   ALT 21 02/05/2014 1430   ALT 8 08/21/2013 1535   BILITOT 0.51 02/05/2014 1430   BILITOT 0.6 08/21/2013 1535      Basic Metabolic Panel:  Recent Labs Lab 02/05/14 1430  NA 142  K 4.3  CO2 22  GLUCOSE 90  BUN 26.6*  CREATININE 1.1  CALCIUM 9.1   GFR Estimated Creatinine Clearance: 38.2 ml/min (by C-G formula based on Cr of 1.1). Liver Function Tests:  Recent Labs Lab 02/05/14 1430  AST 19  ALT 21  ALKPHOS 81  BILITOT 0.51  PROT 5.6*  ALBUMIN 3.7   Coagulation profile No results found for this basename: INR, PROTIME,  in the last 168 hours  CBC:  Recent Labs Lab 02/05/14 1430  WBC 4.5  NEUTROABS 2.2  HGB 12.3  HCT 36.2  MCV 87.7  PLT 102*   Studies:  No results found.   RADIOGRAPHIC STUDIES: None  PATHOLOGY: None  ASSESSMENT: Eber Hong 73 y.o. female with a history of Non-Hodgkins lymphoma - Plan: CBC with Differential, Comprehensive metabolic panel (Cmet) - [facility], Lactate dehydrogenase (LDH) - [facility], CT Chest Wo Contrast, CT Abdomen Wo Contrast   PLAN:  1. 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 -- She continues to do well overall.  Her plts are 102K. Her PET demonstrated a complete response. Her bone marrow biopsy as noted also exclude the presence of lymphoma.  We started observation with H&Wise an labs, every 3-6 months for 5 year and then yearly or as clinically indicated.  Her CT scan should start every 6 months for 2 years after completion of treatment, then only as clinically indicated.  We will schedule a CT C/A/Wise without contrast on 09.2015 prior to her next  visit.   2. 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 -- Treatment duration is 6 months.  She ended anticoagulation on 12/14/2013.  PE likely secondary to immobilization during recent hospitalizations and active malignancy as noted above.    3. Diastolic/systolic CHF (EF 54-36%).  --Doxorubicin was discontinued due to worsening congestive heart failure. She is evolemic today.  She has a scheduled follow up with Dr. Claiborne Billings.  4. 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  --Continue valcyte prophylaxis for now  5. Follow-up.  --  F/u in 8-9 for labs including cbc, cmp and symptom visit with a CT C/A/Wise prior to her next visit. Port-a-cath flushes every 8 weeks.    All questions were answered. The patient knows to call the clinic with any problems, questions or concerns. We can certainly see the patient much sooner if necessary.  I spent 15 minutes counseling the patient face to face. The total time spent in the appointment was 25 minutes.  Mamta Rimmer, MD 02/06/2014 10:06 AM

## 2014-02-06 ENCOUNTER — Encounter: Payer: Self-pay | Admitting: Internal Medicine

## 2014-02-09 ENCOUNTER — Ambulatory Visit: Payer: Medicare Other

## 2014-02-09 DIAGNOSIS — IMO0001 Reserved for inherently not codable concepts without codable children: Secondary | ICD-10-CM | POA: Diagnosis not present

## 2014-02-10 ENCOUNTER — Telehealth: Payer: Self-pay | Admitting: Internal Medicine

## 2014-02-10 NOTE — Telephone Encounter (Signed)
s/w pt re next appt for 8/21. pt will check mychart for appts and get new schedule 8/21. central will call re ct.

## 2014-02-11 ENCOUNTER — Ambulatory Visit: Payer: Medicare Other | Admitting: Physical Therapy

## 2014-02-13 ENCOUNTER — Other Ambulatory Visit: Payer: Self-pay | Admitting: Family Medicine

## 2014-02-13 ENCOUNTER — Other Ambulatory Visit: Payer: Self-pay | Admitting: Internal Medicine

## 2014-02-16 ENCOUNTER — Ambulatory Visit: Payer: Medicare Other

## 2014-02-16 DIAGNOSIS — IMO0001 Reserved for inherently not codable concepts without codable children: Secondary | ICD-10-CM | POA: Diagnosis not present

## 2014-02-18 ENCOUNTER — Encounter: Payer: Medicare Other | Admitting: Physical Therapy

## 2014-02-23 ENCOUNTER — Encounter: Payer: Medicare Other | Admitting: Physical Therapy

## 2014-02-25 ENCOUNTER — Encounter: Payer: Medicare Other | Admitting: Physical Therapy

## 2014-03-16 ENCOUNTER — Ambulatory Visit (HOSPITAL_COMMUNITY)
Admission: RE | Admit: 2014-03-16 | Discharge: 2014-03-16 | Disposition: A | Discharge status: Home / Self Care | Payer: Medicare Other | Source: Ambulatory Visit | Attending: Cardiovascular Disease | Admitting: Cardiovascular Disease

## 2014-03-16 DIAGNOSIS — I059 Rheumatic mitral valve disease, unspecified: Secondary | ICD-10-CM | POA: Insufficient documentation

## 2014-03-16 DIAGNOSIS — I10 Essential (primary) hypertension: Secondary | ICD-10-CM

## 2014-03-16 DIAGNOSIS — I2789 Other specified pulmonary heart diseases: Secondary | ICD-10-CM | POA: Insufficient documentation

## 2014-03-16 NOTE — Progress Notes (Signed)
2D Echo Performed 03/16/2014    Marygrace Drought, RCS

## 2014-03-19 ENCOUNTER — Ambulatory Visit (HOSPITAL_BASED_OUTPATIENT_CLINIC_OR_DEPARTMENT_OTHER): Payer: Medicare Other

## 2014-03-19 VITALS — BP 171/71 | HR 68

## 2014-03-19 DIAGNOSIS — Z452 Encounter for adjustment and management of vascular access device: Secondary | ICD-10-CM | POA: Diagnosis not present

## 2014-03-19 DIAGNOSIS — Z95828 Presence of other vascular implants and grafts: Secondary | ICD-10-CM

## 2014-03-19 DIAGNOSIS — C8589 Other specified types of non-Hodgkin lymphoma, extranodal and solid organ sites: Secondary | ICD-10-CM

## 2014-03-19 MED ORDER — HEPARIN SOD (PORK) LOCK FLUSH 100 UNIT/ML IV SOLN
500.0000 [IU] | Freq: Once | INTRAVENOUS | Status: AC
  Administered 2014-03-19: 500 [IU] via INTRAVENOUS
  Filled 2014-03-19: qty 5

## 2014-03-19 MED ORDER — SODIUM CHLORIDE 0.9 % IJ SOLN
10.0000 mL | INTRAMUSCULAR | Status: DC | PRN
  Administered 2014-03-19: 10 mL via INTRAVENOUS
  Filled 2014-03-19: qty 10

## 2014-03-24 ENCOUNTER — Encounter (HOSPITAL_COMMUNITY): Payer: Self-pay

## 2014-04-12 ENCOUNTER — Ambulatory Visit (INDEPENDENT_AMBULATORY_CARE_PROVIDER_SITE_OTHER): Payer: Medicare Other | Admitting: Family Medicine

## 2014-04-12 ENCOUNTER — Encounter: Payer: Self-pay | Admitting: Family Medicine

## 2014-04-12 VITALS — BP 120/70 | HR 60 | Wt 130.0 lb

## 2014-04-12 DIAGNOSIS — L989 Disorder of the skin and subcutaneous tissue, unspecified: Secondary | ICD-10-CM

## 2014-04-13 NOTE — Progress Notes (Signed)
   Subjective:    Patient ID: IZABELA OW, female    DOB: Oct 29, 1940, 73 y.o.   MRN: [id number]  HPI She is here for evaluation of a lesion on her left calf. She noted this during her chemotherapy in April. The lesion growing and she did note black specks in it. She started treating this with Compound W 2 weeks ago and is here for further evaluation because it became sensitive and inflamed. She has noted no drainage or increased temperature in that area .   Review of Systems     Objective:   Physical Exam She has a one and half centimeter raised whitish lesion with some surrounding erythema but is not warm or tender. It is over the mid calf area.       Assessment & Plan:  Leg skin lesion, left  this is probably a wart. Recommend she leave this area alone for about one month and recheck at that point.

## 2014-04-16 ENCOUNTER — Ambulatory Visit (HOSPITAL_COMMUNITY)
Admission: RE | Admit: 2014-04-16 | Discharge: 2014-04-16 | Disposition: A | Discharge status: Home / Self Care | Payer: Medicare Other | Source: Ambulatory Visit | Attending: Internal Medicine | Admitting: Internal Medicine

## 2014-04-16 ENCOUNTER — Encounter (HOSPITAL_COMMUNITY): Payer: Self-pay

## 2014-04-16 ENCOUNTER — Other Ambulatory Visit (HOSPITAL_BASED_OUTPATIENT_CLINIC_OR_DEPARTMENT_OTHER): Payer: Medicare Other

## 2014-04-16 DIAGNOSIS — C8589 Other specified types of non-Hodgkin lymphoma, extranodal and solid organ sites: Secondary | ICD-10-CM | POA: Insufficient documentation

## 2014-04-16 DIAGNOSIS — R161 Splenomegaly, not elsewhere classified: Secondary | ICD-10-CM | POA: Diagnosis not present

## 2014-04-16 DIAGNOSIS — C8590 Non-Hodgkin lymphoma, unspecified, unspecified site: Secondary | ICD-10-CM

## 2014-04-16 DIAGNOSIS — Z79899 Other long term (current) drug therapy: Secondary | ICD-10-CM | POA: Insufficient documentation

## 2014-04-16 LAB — CBC WITH DIFFERENTIAL/PLATELET
BASO%: 0.3 % (ref 0.0–2.0)
BASOS ABS: 0 10*3/uL (ref 0.0–0.1)
EOS ABS: 0.1 10*3/uL (ref 0.0–0.5)
EOS%: 1.3 % (ref 0.0–7.0)
HCT: 38.4 % (ref 34.8–46.6)
HEMOGLOBIN: 12.8 g/dL (ref 11.6–15.9)
LYMPH%: 36.2 % (ref 14.0–49.7)
MCH: 29.5 pg (ref 25.1–34.0)
MCHC: 33.3 g/dL (ref 31.5–36.0)
MCV: 88.5 fL (ref 79.5–101.0)
MONO#: 0.4 10*3/uL (ref 0.1–0.9)
MONO%: 9.8 % (ref 0.0–14.0)
NEUT%: 52.4 % (ref 38.4–76.8)
NEUTROS ABS: 2.1 10*3/uL (ref 1.5–6.5)
PLATELETS: 88 10*3/uL — AB (ref 145–400)
RBC: 4.34 10*6/uL (ref 3.70–5.45)
RDW: 14.4 % (ref 11.2–14.5)
WBC: 4 10*3/uL (ref 3.9–10.3)
lymph#: 1.4 10*3/uL (ref 0.9–3.3)

## 2014-04-16 LAB — COMPREHENSIVE METABOLIC PANEL (CC13)
ALK PHOS: 71 U/L (ref 40–150)
ALT: 12 U/L (ref 0–55)
AST: 17 U/L (ref 5–34)
Albumin: 3.7 g/dL (ref 3.5–5.0)
Anion Gap: 8 mEq/L (ref 3–11)
BILIRUBIN TOTAL: 0.59 mg/dL (ref 0.20–1.20)
BUN: 23.9 mg/dL (ref 7.0–26.0)
CO2: 27 mEq/L (ref 22–29)
CREATININE: 1.3 mg/dL — AB (ref 0.6–1.1)
Calcium: 9.1 mg/dL (ref 8.4–10.4)
Chloride: 108 mEq/L (ref 98–109)
GLUCOSE: 79 mg/dL (ref 70–140)
Potassium: 4.3 mEq/L (ref 3.5–5.1)
Sodium: 142 mEq/L (ref 136–145)
Total Protein: 5.9 g/dL — ABNORMAL LOW (ref 6.4–8.3)

## 2014-04-16 LAB — LACTATE DEHYDROGENASE (CC13): LDH: 210 U/L (ref 125–245)

## 2014-04-21 ENCOUNTER — Encounter: Payer: Self-pay | Admitting: Family Medicine

## 2014-04-21 ENCOUNTER — Ambulatory Visit (INDEPENDENT_AMBULATORY_CARE_PROVIDER_SITE_OTHER): Payer: Medicare Other | Admitting: Family Medicine

## 2014-04-21 VITALS — BP 118/80 | HR 68 | Temp 97.9°F | Wt 131.0 lb

## 2014-04-21 DIAGNOSIS — R51 Headache: Secondary | ICD-10-CM | POA: Diagnosis not present

## 2014-04-21 DIAGNOSIS — Z23 Encounter for immunization: Secondary | ICD-10-CM

## 2014-04-21 NOTE — Progress Notes (Signed)
   Subjective:    Patient ID: Tonya Wise, female    DOB: [birth year], 73 y.o.   MRN: [id number]  HPI She is here for evaluation of a six-day history this started with intermittent right temporal pain is constant in nature and would last for several minutes and occur intermittently for short period of time and then would go away. She then noted pain radiating into her ear and jaw as well as the development of rhinorrhea and tearing of her eye. Again the symptoms would be brief and would occur in clusters of several of them over a period of time and then stop the rest of the day to no blurred vision, double vision, dizziness, nausea vomiting. Sometimes chewing makes this worse.   Review of Systems     Objective:   Physical Exam Alert and in no distress. Slight tenderness palpation noted over the temporal artery. TMs and canals appear normal. No anterior cervical adenopathy. Throat is clear. TMJ is nontender to palpation       Assessment & Plan:  Need for prophylactic vaccination and inoculation against influenza - Plan: Flu vaccine HIGH DOSE PF (Fluzone Tri High dose)  Headache(784.0) - Plan: Sedimentation rate  her symptoms sound possibly like cluster headache but also must consider temporal arteritis and I will therefore get a sedimentation rate.

## 2014-04-22 LAB — SEDIMENTATION RATE: SED RATE: 1 mm/h (ref 0–22)

## 2014-04-23 ENCOUNTER — Ambulatory Visit (HOSPITAL_BASED_OUTPATIENT_CLINIC_OR_DEPARTMENT_OTHER): Payer: Medicare Other | Admitting: Hematology

## 2014-04-23 ENCOUNTER — Ambulatory Visit: Payer: Medicare Other

## 2014-04-23 ENCOUNTER — Encounter: Payer: Medicare Other | Admitting: Family Medicine

## 2014-04-23 VITALS — BP 171/75 | HR 68 | Temp 98.0°F | Resp 20 | Ht 63.0 in | Wt 130.4 lb

## 2014-04-23 DIAGNOSIS — I509 Heart failure, unspecified: Secondary | ICD-10-CM | POA: Diagnosis not present

## 2014-04-23 DIAGNOSIS — Z95828 Presence of other vascular implants and grafts: Secondary | ICD-10-CM

## 2014-04-23 DIAGNOSIS — C8590 Non-Hodgkin lymphoma, unspecified, unspecified site: Secondary | ICD-10-CM

## 2014-04-23 DIAGNOSIS — C8589 Other specified types of non-Hodgkin lymphoma, extranodal and solid organ sites: Secondary | ICD-10-CM

## 2014-04-23 DIAGNOSIS — I5040 Unspecified combined systolic (congestive) and diastolic (congestive) heart failure: Secondary | ICD-10-CM

## 2014-04-23 DIAGNOSIS — Z86711 Personal history of pulmonary embolism: Secondary | ICD-10-CM | POA: Diagnosis not present

## 2014-04-23 DIAGNOSIS — C8330 Diffuse large B-cell lymphoma, unspecified site: Secondary | ICD-10-CM

## 2014-04-23 MED ORDER — HEPARIN SOD (PORK) LOCK FLUSH 100 UNIT/ML IV SOLN
500.0000 [IU] | Freq: Once | INTRAVENOUS | Status: DC
  Filled 2014-04-23: qty 5

## 2014-04-23 MED ORDER — SODIUM CHLORIDE 0.9 % IJ SOLN
10.0000 mL | Freq: Once | INTRAMUSCULAR | Status: DC
  Filled 2014-04-23: qty 10

## 2014-04-23 MED ORDER — HEPARIN SOD (PORK) LOCK FLUSH 100 UNIT/ML IV SOLN
500.0000 [IU] | Freq: Once | INTRAVENOUS | Status: AC
  Administered 2014-04-23: 500 [IU] via INTRAVENOUS
  Filled 2014-04-23: qty 5

## 2014-04-23 MED ORDER — SODIUM CHLORIDE 0.9 % IJ SOLN
10.0000 mL | INTRAMUSCULAR | Status: DC | PRN
  Administered 2014-04-23: 10 mL via INTRAVENOUS
  Filled 2014-04-23: qty 10

## 2014-04-23 NOTE — Patient Instructions (Signed)

## 2014-04-24 ENCOUNTER — Encounter: Payer: Self-pay | Admitting: Hematology

## 2014-04-24 NOTE — Progress Notes (Signed)
[place] OFFICE PROGRESS NOTE  04/23/2014  [name], MD [place] [address]  DIAGNOSIS: DLBCL (diffuse large B cell lymphoma) - Plan: CBC with Differential, Comprehensive metabolic panel (Cmet) - [facility], Lactate dehydrogenase (LDH) - [facility], Sedimentation rate  Chief Complaint  Patient presents with  . Follow-up      Non-Hodgkins lymphoma   03/27/2013 - 03/31/2013 Hospital Admission Admitted to [facility] for profound anemia (Hgb 6.4).  C/o fevers, drenching nightsweats, weight lost.  Negative colonoscopy and EGD.  CT of C/A/P demonstrated significant lymphadenopathy suspcious of ympha.     03/31/2013 Initial Diagnosis Biopsy of R inguinal lymph node revealed Non-Hodgkins lymphoma. Diffuse Large B-cell Lymphoma   04/10/2013 - 04/24/2013 Hospital Admission Hypercalcemia, renal failure and dehydration.  Started min-RCHOP.    04/15/2013 Bone Marrow Biopsy Involvement with Diffuse Large B-cell Lymphoma. Stage IV NHL w B-symptoms.    04/18/2013 - 04/18/2013 Chemotherapy Mini R-CHOP Cycle #1 on 04/18/2013. Dose reduced to poor nutrion. It consisted on Cytoxan 400 mg/m2,Doxorubicin 25 mg/m2, Rituximab 375 mg/m2, Vincristine 77m.  Prednisone 100 mg daily for 9/13 - 9/16.   Received neupogen on 9/21, 9/27 -9/30.     04/25/2013 - 04/29/2013 Hospital Admission Admitted due to worsening mouth sores/oral thrush, odynophagia, failure to thrive.  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  [facility](Dr[facility] CGwynneth Munson and discharged to home on 05/19/2013.     06/01/2013 - 06/01/2013 Chemotherapy Mini R-CHO #2 dosed as above.  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  Received neulasta shot on 06/02/13   06/16/2013 Cancer Staging Re-staging PET/CT demonstrated significant interval reduction in axillary lymphadenopathy.  There was 1 small residual right axillary lymph node  measuring 16 x 12 mm which showed FDG uptake.  Signifcant reduction in mediastinal and hilar lymphadenopathy.    06/16/2013 Imaging CT of chest noted left-sided pulmonary embolim. Started on lovenox to coumadin bridge.     06/22/2013 - 06/22/2013 Chemotherapy Mini-R-CHO #3.  Received Neulasta on 06/23/2013.     07/16/2013 Imaging 2-D Echocardiogram demonstrates 35-45% EF with Grade 2 diastolic dysfunction.  Doxorubicin discontinued with consultation by cardiology (Dr. KClaiborne Billings.     08/19/2013 - 08/21/2013 Chemotherapy Mini-R-CEO #4.  Etoposide 25 mg/m2 instead of doxo due to above.  Etoposide 547mbid on day 2/3.  On day 3 (08/21/2013) she received 1 of two oral doses of ectoposide.    08/21/2013 - 08/26/2013 Hospital Ad[facility] respiratory failure and had influenzae A and pneumonia. Completed tamiflu and oral antibiotics.    09/25/2013 - 09/27/2013 Chemotherapy Mini-R-CEO #5. Etoposide 25 mg/m2 25 mg bid on days 2/3.    10/16/2013 - 10/19/2013 Chemotherapy Mini-R-CEO # 6. Etoposide 25 mg/m2 bid on days 2/3. Neulasta on day#4.    11/05/2013 Imaging Interval resolution of the hypermetabolism identified within the enlarged right axillary lymph node seen previously. This lymph node has also decreased markedly in size in the interval. No new hypermetabolic lesions in the neck, chest, abdomen, or  pelvis.    11/19/2013 Bone Marrow Biopsy There is no evidence of a B-cell lymphoproliferative process in this material. Normal cytogenetics.    04/16/2014 Imaging CT scan Chest, abdomen: No evidence of recurrent lymphoma within the chest or abdomen.   Significant decrease in splenomegaly since prior exam.    INTERVAL HISTORY:  RuNASYA VINCENT89.o. female with the above is here for follow-up.  She was last  seen by Dr Juliann Mule on 02/05/2014.    She denies any fevers.  She reports that her appetite has improved.  She presents along with her husband [name]. She reports improvement in her cough. She  denies melena or hematochezia.  I told patient to stop the Zovirax and Potassium. She has a small wart in back of left leg and she has shown that to her PCP. She is here to discuss the results of her CAT scan and today is also her birthday. Patient is traveling to [facility] in October for a month but when she returns I will have her porta cath removed in November.   MEDICAL HISTORY: Past Medical History  Diagnosis Date  . Hypertension   . Renal insufficiency   . Osteopenia   . Allergy   . Heart murmur   . GERD (gastroesophageal reflux disease)   . Anemia   . Hypercalcemia 03/27/2013  . Diastolic dysfunction, grade I 04/12/2013  . Non-Hodgkins lymphoma 04/06/2013    INTERIM HISTORY: has Hypercalcemia; Non-Hodgkins lymphoma; Elevated LFTs; Protein-calorie malnutrition, severe; Anemia of chronic disease; Acute on chronic combined systolic and diastolic heart failure; Other pulmonary embolism and infarction; Acute on chronic cough; Dysphagia, unspecified(787.20); Cardiomyopathy; Diastolic dysfunction grade 2; PNA (pneumonia); Influenza with pneumonia; Thrombocytopenia, unspecified; Acute respiratory failure with hypoxia secondary to influenza A with pneumonia; UTI (urinary tract infection); History of pulmonary embolism; HTN (hypertension); Hypokalemia; and Thrush on her problem list.    ALLERGIES:  is allergic to prednisone and penicillins.  MEDICATIONS: has a current medication list which includes the following prescription(s): hydrocodone-homatropine, lidocaine-prilocaine, losartan, metoprolol tartrate, and omeprazole.  SURGICAL HISTORY:  Past Surgical History  Procedure Laterality Date  . Appendectomy    . Colonoscopy  2008  . Spine surgery    . Back surgery      LOWER BACK TWICE  . Abdominal hysterectomy    . Esophagogastroduodenoscopy N/A 03/29/2013    Procedure: ESOPHAGOGASTRODUODENOSCOPY (EGD);  Surgeon: Juanita Craver, MD;  Location: [facility] ENDOSCOPY;  Service: Endoscopy;  Laterality: N/A;   . Inguinal lymph node biopsy Right 03/31/2013    Procedure: INGUINAL LYMPH NODE BIOPSY;  Surgeon: Gwenyth Ober, MD;  Location: [place];  Service: General;  Laterality: Right;    REVIEW OF SYSTEMS:   Constitutional: Denies fevers, chills; She continues to have decreased in her taste.  Eyes: Denies blurriness of vision Ears, nose, mouth, throat, and face:  Respiratory: Denies cough, dyspnea or wheezes Cardiovascular: Denies palpitation, chest discomfort or lower extremity swelling Gastrointestinal:  Denies nausea, heartburn or change in bowel habits; abdominal discomfort as noted above.  Skin: Denies abnormal skin rashes Lymphatics: Denies new lymphadenopathy or easy bruising Neurological:Denies numbness, tingling or new weaknesses Behavioral/Psych: Mood is stable, no new changes  All other systems were reviewed with the patient and are negative.  PHYSICAL EXAMINATION: ECOG PERFORMANCE STATUS: 0 - Asymptomatic  Blood pressure 171/75, pulse 68, temperature 98 F (36.7 C), temperature source Oral, resp. rate 20, height '5\' 3"'  (1.6 m), weight 130 lb 6.4 oz (59.149 kg).  GENERAL:alert, no distress and comfortable; easily ambulatory.  SKIN: skin color, texture, turgor are normal, no rashes or significant lesions.  EYES: normal, Conjunctiva are pink and non-injected, sclera clear OROPHARYNX:no exudate, no erythema and lips, buccal mucosa, and tongue with smooth surface suggestive of recovering mucositis.    NECK: supple, thyroid normal size, non-tender, without nodularity LYMPH:  no palpable lymphadenopathy in the cervical, axillary or supraclavicular LUNGS: clear to auscultation and percussion with normal breathing effort  HEART: regular rate & rhythm and no murmurs and no lower extremity edema ABDOMEN:abdomen soft, non-tender and normal bowel sounds Musculoskeletal:no cyanosis of digits and no clubbing  NEURO: alert & oriented x 3 with fluent speech, no focal motor/sensory deficits  Labs:   Lab Results  Component Value Date   WBC 4.0 04/16/2014   HGB 12.8 04/16/2014   HCT 38.4 04/16/2014   MCV 88.5 04/16/2014   PLT 88* 04/16/2014   NEUTROABS 2.1 04/16/2014      Chemistry      Component Value Date/Time   NA 142 04/16/2014 0907   NA 139 08/26/2013 0945   K 4.3 04/16/2014 0907   K 3.1* 08/26/2013 0945   CL 101 08/26/2013 0945   CO2 27 04/16/2014 0907   CO2 27 08/26/2013 0945   BUN 23.9 04/16/2014 0907   BUN 8 08/26/2013 0945   CREATININE 1.3* 04/16/2014 0907   CREATININE 0.81 08/26/2013 0945   CREATININE 0.85 07/17/2013 0834      Component Value Date/Time   CALCIUM 9.1 04/16/2014 0907   CALCIUM 8.5 08/26/2013 0945   ALKPHOS 71 04/16/2014 0907   ALKPHOS 59 08/21/2013 1535   AST 17 04/16/2014 0907   AST 20 08/21/2013 1535   ALT 12 04/16/2014 0907   ALT 8 08/21/2013 1535   BILITOT 0.59 04/16/2014 0907   BILITOT 0.6 08/21/2013 1535       RADIOGRAPHIC STUDIES:  EXAM: CT CHEST AND ABDOMEN WITHOUT CONTRAST TECHNIQUE:  Multidetector CT imaging of the chest and abdomen was performed following the standard protocol without intravenous contrast. COMPARISON: PET-CT on 10/30/2013 FINDINGS: CT CHEST FINDINGS  Mediastinum/Hilar Regions: No masses or pathologically enlarged lymph nodes identified. Other Thoracic Lymphadenopathy: None. Lungs: No pulmonary infiltrate or mass identified. Right middle lobe scarring remains stable. Pleura: No evidence of effusion or mass. Vascular/Cardiac: No thoracic aortic aneurysm or other significant abnormality identified. Musculoskeletal: No suspicious bone lesions identified. Other: None.  CT ABDOMEN FINDINGS Hepatobiliary: No mass or other parenchymal abnormality identified. Pancreas: No mass, inflammatory changes, or other parenchymal abnormality identified. Spleen: Mild splenomegaly is significantly decreased since prior exam.  Adrenal Glands: No mass identified. Kidneys: No masses identified. No evidence of hydronephrosis.  Lymph Nodes: No pathologically  enlarged lymph nodes identified. Bowel: Visualized loops within the abdomen are unremarkable. Vascular: No evidence of abdominal aortic aneurysm. Musculoskeletal: No suspicious bone lesions identified. Other: None. IMPRESSION:  No evidence of recurrent lymphoma within the chest or abdomen.  Significant decrease in splenomegaly since prior exam.  Electronically Signed By: Earle Gell M.D. On: 04/16/2014 11:57   ASSESSMENT: JOEANNA HOWDYSHELL 73 y.o. female with a history of DLBCL (diffuse large B cell lymphoma) - Plan: CBC with Differential, Comprehensive metabolic panel (Cmet) - [facility], Lactate dehydrogenase (LDH) - [facility], Sedimentation rate   PLAN:  1. 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 -- She continues to do well overall.  Her plts are 1 K and recovering slowly. Her CT demonstrated a complete response. Her bone marrow biopsy as noted also exclude the presence of lymphoma.  We started observation with H&P an labs, every 3-6 months for 5 year and then yearly or as clinically indicated.  Her CT scan should start every 6 months for 2 years after completion of treatment, then only as clinically indicated. RTC in 3 months for labs and a physical exam. Next scan in 6 months.  2. 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 -- Treatment duration is 6 months.  She ended anticoagulation on 12/14/2013.  PE  likely secondary to immobilization during recent hospitalizations and active malignancy as noted above.    3. Diastolic/systolic CHF (EF 03-70%).  --Doxorubicin was discontinued due to worsening congestive heart failure. She is euvolemic today.  She has a scheduled follow up with Dr. Claiborne Billings.  4. 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  --I am stopping Zovirax for now.  5. Follow-up.  --  F/u in 3 months with labs. I will have portacath removed in November 2015. Her prognosis is very good.     All questions were answered. The patient knows to call the clinic with any problems, questions or concerns. We can certainly  see the patient much sooner if necessary.  I spent 15 minutes counseling the patient face to face. The total time spent in the appointment was 25 minutes.    Bernadene Bell, MD Medical Hematologist/Oncologist [place] Pager: [phone] Office No: [phone]

## 2014-04-26 ENCOUNTER — Other Ambulatory Visit: Payer: Self-pay

## 2014-04-26 DIAGNOSIS — R51 Headache: Secondary | ICD-10-CM

## 2014-04-27 ENCOUNTER — Telehealth: Payer: Self-pay | Admitting: Hematology

## 2014-04-27 ENCOUNTER — Other Ambulatory Visit: Payer: Self-pay | Admitting: Hematology

## 2014-04-27 DIAGNOSIS — C8590 Non-Hodgkin lymphoma, unspecified, unspecified site: Secondary | ICD-10-CM

## 2014-04-27 NOTE — Telephone Encounter (Signed)
s.w. pt and advised on Jan 2016 appt....pt will ck mychart....emailed Dr. Lona Kettle to add order for port removal

## 2014-04-28 ENCOUNTER — Encounter: Payer: Medicare Other | Admitting: Family Medicine

## 2014-06-04 ENCOUNTER — Other Ambulatory Visit: Payer: Self-pay | Admitting: Radiology

## 2014-06-07 ENCOUNTER — Encounter: Payer: Self-pay | Admitting: Neurology

## 2014-06-07 ENCOUNTER — Ambulatory Visit (INDEPENDENT_AMBULATORY_CARE_PROVIDER_SITE_OTHER): Payer: Medicare Other | Admitting: Neurology

## 2014-06-07 VITALS — BP 118/70 | HR 68 | Resp 16 | Ht 63.0 in | Wt 132.8 lb

## 2014-06-07 DIAGNOSIS — G4440 Drug-induced headache, not elsewhere classified, not intractable: Secondary | ICD-10-CM

## 2014-06-07 DIAGNOSIS — R51 Headache: Secondary | ICD-10-CM | POA: Diagnosis not present

## 2014-06-07 DIAGNOSIS — H538 Other visual disturbances: Secondary | ICD-10-CM

## 2014-06-07 DIAGNOSIS — G4441 Drug-induced headache, not elsewhere classified, intractable: Secondary | ICD-10-CM

## 2014-06-07 DIAGNOSIS — R519 Headache, unspecified: Secondary | ICD-10-CM

## 2014-06-07 NOTE — Progress Notes (Signed)
NEUROLOGY CONSULTATION NOTE  BRADY PLANT MRN: [id number] DOB: [birth year]  Referring provider: Dr. Redmond School Primary care provider: Dr. Redmond School  Reason for consult:  Headache and vision changes  HISTORY OF PRESENT ILLNESS: Tonya Wise is a 73 year old right-handed woman with history of Non-Hodgkins lymphoma (finished chemo in March 2015), anemia of chronic disease, hypertension, cardiomyopathy, chronic systolic and diastolic heart failure, pulmonary embolism, and pneumonia who presents for headache.  Records and labs personally reviewed.  Onset:  Summer 2015 Location:  Bi-temporal and retro-orbital Quality:  1) sharp pain, 2) nonthrobbing ache Intensity:  1)10/10, 2) aching pain 6-7/10 Aura:  no Prodrome:  no Associated symptoms:  No nausea, vomiting, photophobia, phonophobia or autonomic symptoms.   Duration:  Sharp pain lasts 1 hour.  Aching pain lasts all day Frequency:  Daily (sharp pain occurs 1-2 times a week) Triggers/exacerbating factors:  no Relieving factors:  no Activity:  Can't function with sharp pain  Past abortive therapy: none Past preventative therapy:  none  Current abortive therapy:  Takes something every day.  Tylenol (3-4 times a week, ibuprofen (1-2x/week)  Current preventative therapy:  none Other medications:  Lopressor 37.5mg  twice daily, omeprazole  Since onset of these symptoms, she has had constant monocular blurred vision.  She last had an eye exam a year ago and was told that her vision was normal (only needed some reading glasses which she does not usually need).  She also feels unsteady on her feet.  Her head feels "light".  She also reports some spinning sensation at times when lying in bed.  Labs from 04/16/14 showed WBC 4, HGB 12.8, HCT 38.4, PLT 88.  Labs from 04/21/14 showed Sed Rate of 1.  PAST MEDICAL HISTORY: Past Medical History  Diagnosis Date  . Hypertension   . Renal insufficiency   . Osteopenia   . Allergy   . Heart murmur     . GERD (gastroesophageal reflux disease)   . Anemia   . Hypercalcemia 03/27/2013  . Diastolic dysfunction, grade I 04/12/2013  . Non-Hodgkins lymphoma 04/06/2013    PAST SURGICAL HISTORY: Past Surgical History  Procedure Laterality Date  . Appendectomy    . Colonoscopy  2008  . Spine surgery    . Back surgery      LOWER BACK TWICE  . Abdominal hysterectomy    . Esophagogastroduodenoscopy N/A 03/29/2013    Procedure: ESOPHAGOGASTRODUODENOSCOPY (EGD);  Surgeon: Juanita Craver, MD;  Location: [facility] ENDOSCOPY;  Service: Endoscopy;  Laterality: N/A;  . Inguinal lymph node biopsy Right 03/31/2013    Procedure: INGUINAL LYMPH NODE BIOPSY;  Surgeon: Gwenyth Ober, MD;  Location: [place];  Service: General;  Laterality: Right;    MEDICATIONS: Current Outpatient Prescriptions on File Prior to Visit  Medication Sig Dispense Refill  . HYDROcodone-homatropine (HYCODAN) 5-1.5 MG/5ML syrup Take 5 mLs by mouth every 6 (six) hours as needed for cough. 120 mL 0  . losartan (COZAAR) 25 MG tablet Take 1 tablet (25 mg total) by mouth 2 (two) times daily. 180 tablet 3  . metoprolol tartrate (LOPRESSOR) 25 MG tablet Take 37.5 mg by mouth 2 (two) times daily. Take 1 1/2 tab twice daily    . omeprazole (PRILOSEC) 40 MG capsule TAKE ONE CAPSULE BY MOUTH EVERY DAY 30 capsule 10  . lidocaine-prilocaine (EMLA) cream Apply 1 application topically as needed. Apply to port site one hour before treatment and cover with plastic wrap. 30 g 3   No current facility-administered medications on file prior  to visit.    ALLERGIES: Allergies  Allergen Reactions  . Prednisone Other (See Comments)    "like being in a coma"  . Penicillins Itching, Rash and Other (See Comments)    "burning sensation"    FAMILY HISTORY: Family History  Problem Relation Age of Onset  . Heart attack Father 57  . Leukemia Mother   . Diabetes Brother   . Diabetes Sister     SOCIAL HISTORY: History   Social History  . Marital Status:  Married    Spouse Name: [name]    Number of Children: 2  . Years of Education: 12   Occupational History  .  [employer]   Social History Main Topics  . Smoking status: Former Smoker -- 0.50 packs/day for 2 years    Types: Cigarettes    Quit date: 07/30/1960  . Smokeless tobacco: Never Used  . Alcohol Use: Yes     Comment: rare-wine drinker  . Drug Use: No  . Sexual Activity:    Partners: Male   Other Topics Concern  . Not on file   Social History Narrative   Married.  Lives in [place] with husband.  Ambulates with a walker when able.    REVIEW OF SYSTEMS: Constitutional: No fevers, chills, or sweats, no generalized fatigue, change in appetite Eyes: Blurred vision Ear, nose and throat: No hearing loss, ear pain, nasal congestion, sore throat Cardiovascular: No chest pain, palpitations Respiratory:  No shortness of breath at rest or with exertion, wheezes GastrointestinaI: No nausea, vomiting, diarrhea, abdominal pain, fecal incontinence Genitourinary:  No dysuria, urinary retention or frequency Musculoskeletal:  No neck pain, back pain Integumentary: No rash, pruritus, skin lesions Neurological: as above Psychiatric: No depression, insomnia, anxiety Endocrine: No palpitations, fatigue, diaphoresis, mood swings, change in appetite, change in weight, increased thirst Hematologic/Lymphatic:  No anemia, purpura, petechiae. Allergic/Immunologic: no itchy/runny eyes, nasal congestion, recent allergic reactions, rashes  PHYSICAL EXAM: Filed Vitals:   06/07/14 0946  BP: 118/70  Pulse: 68  Resp: 16   General: No acute distress Eyes:  Fundi not able to visualize.  Visual acuity OS 20/100, OD 20/400. Head:  Normocephalic/atraumatic Neck: supple, no paraspinal tenderness, full range of motion Back: No paraspinal tenderness Heart: regular rate and rhythm Lungs: Clear to auscultation bilaterally. Vascular: No carotid bruits. Neurological Exam: Mental status: alert and  oriented to person, place, and time, recent and remote memory intact, fund of knowledge intact, attention and concentration intact, speech fluent and not dysarthric, language intact. Cranial nerves: CN I: not tested CN II: pupils equal, round and reactive to light, visual fields intact, fundi not visualized. CN III, IV, VI:  full range of motion, no nystagmus, no ptosis CN V: facial sensation intact CN VII: upper and lower face symmetric CN VIII: hearing intact CN IX, X: gag intact, uvula midline CN XI: sternocleidomastoid and trapezius muscles intact CN XII: tongue midline Bulk & Tone: Findings inconsistent, but possible increased tone in left lower extremity.  No fasciculations. Motor: 5/5 throughout Sensation: reduced vibration sensation in toes of left foot.  Pinprick intact. Deep Tendon Reflexes: 2+ throughout but perhaps 3+ in left patellar (inconsistent).  Left first toe extended (she says it is chronic due to prior surgery).  Right toes downgoing. Finger to nose testing:  No dysmetria Heel to shin: no dysmetria Gait:  Normal station and stride.  Able to turn and walk in tandem. Romberg negative.  IMPRESSION: New daily persistent headache.  Etiology unknown but at least partially due  to medication overuse. Blurred vision Vertigo Focal findings on exam were inconsistent but need to be worked up.  PLAN: 1.  MRI of the brain with and without contrast. 2.  Recommend eye exam.  Patient has an ophthalmologist and will schedule appointment. 3.  Further treatment and management pending MRI results.  Thank you for allowing me to take part in the care of this patient.  Metta Clines, DO  CC:  Jill Alexanders, MD

## 2014-06-07 NOTE — Patient Instructions (Addendum)
1. We will get MRI of the brain with and without contrast to look for a secondary cause for headache [facility] 06/21/14 7:45am 2.  I also recommend that you have your eyes examined by an ophthalmologist (contact Dr. Tarri Glenn' office 3. Limit use of all pain relievers (including tylenol and ibuprofen) to no more than 2 days out of the week (due to rebound headche) 4.  Follow up after MRI for next steps and management

## 2014-06-08 ENCOUNTER — Encounter (HOSPITAL_COMMUNITY): Payer: Self-pay

## 2014-06-08 ENCOUNTER — Ambulatory Visit (HOSPITAL_COMMUNITY)
Admission: RE | Admit: 2014-06-08 | Discharge: 2014-06-08 | Disposition: A | Discharge status: Home / Self Care | Payer: Medicare Other | Source: Ambulatory Visit | Attending: Hematology | Admitting: Hematology

## 2014-06-08 DIAGNOSIS — M8580 Other specified disorders of bone density and structure, unspecified site: Secondary | ICD-10-CM | POA: Insufficient documentation

## 2014-06-08 DIAGNOSIS — Z87891 Personal history of nicotine dependence: Secondary | ICD-10-CM | POA: Diagnosis not present

## 2014-06-08 DIAGNOSIS — K219 Gastro-esophageal reflux disease without esophagitis: Secondary | ICD-10-CM | POA: Diagnosis not present

## 2014-06-08 DIAGNOSIS — I10 Essential (primary) hypertension: Secondary | ICD-10-CM | POA: Insufficient documentation

## 2014-06-08 DIAGNOSIS — Z452 Encounter for adjustment and management of vascular access device: Secondary | ICD-10-CM | POA: Insufficient documentation

## 2014-06-08 DIAGNOSIS — C8590 Non-Hodgkin lymphoma, unspecified, unspecified site: Secondary | ICD-10-CM | POA: Insufficient documentation

## 2014-06-08 DIAGNOSIS — Z538 Procedure and treatment not carried out for other reasons: Secondary | ICD-10-CM | POA: Diagnosis not present

## 2014-06-08 DIAGNOSIS — I5189 Other ill-defined heart diseases: Secondary | ICD-10-CM | POA: Insufficient documentation

## 2014-06-08 LAB — CBC
HEMATOCRIT: 38.5 % (ref 36.0–46.0)
HEMOGLOBIN: 12.9 g/dL (ref 12.0–15.0)
MCH: 28.7 pg (ref 26.0–34.0)
MCHC: 33.5 g/dL (ref 30.0–36.0)
MCV: 85.6 fL (ref 78.0–100.0)
Platelets: 91 10*3/uL — ABNORMAL LOW (ref 150–400)
RBC: 4.5 MIL/uL (ref 3.87–5.11)
RDW: 13.4 % (ref 11.5–15.5)
WBC: 4.9 10*3/uL (ref 4.0–10.5)

## 2014-06-08 LAB — BASIC METABOLIC PANEL
ANION GAP: 14 (ref 5–15)
BUN: 18 mg/dL (ref 6–23)
CO2: 26 meq/L (ref 19–32)
CREATININE: 1.13 mg/dL — AB (ref 0.50–1.10)
Calcium: 9.4 mg/dL (ref 8.4–10.5)
Chloride: 103 mEq/L (ref 96–112)
GFR calc Af Amer: 54 mL/min — ABNORMAL LOW (ref >90)
GFR calc non Af Amer: 47 mL/min — ABNORMAL LOW (ref >90)
GLUCOSE: 97 mg/dL (ref 70–99)
Potassium: 3.7 mEq/L (ref 3.7–5.3)
SODIUM: 143 meq/L (ref 137–147)

## 2014-06-08 LAB — APTT: APTT: 35 s (ref 24–37)

## 2014-06-08 LAB — PROTIME-INR
INR: 1.05 (ref 0.00–1.49)
Prothrombin Time: 13.8 seconds (ref 11.6–15.2)

## 2014-06-08 MED ORDER — VANCOMYCIN HCL IN DEXTROSE 1-5 GM/200ML-% IV SOLN
1000.0000 mg | Freq: Once | INTRAVENOUS | Status: DC
Start: 2014-06-08 — End: 2014-06-09

## 2014-06-08 MED ORDER — SODIUM CHLORIDE 0.9 % IV SOLN
Freq: Once | INTRAVENOUS | Status: AC
  Administered 2014-06-08: 08:00:00 via INTRAVENOUS

## 2014-06-08 NOTE — H&P (Signed)
Chief Complaint: "I'm getting my port a cath out"  Referring Physician(s): Sehbai,Aasim Shaheen  History of Present Illness: Tonya Wise is a 73 y.o. 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 She presents now for port a cath removal.   Past Medical History  Diagnosis Date  . Hypertension   . Renal insufficiency   . Osteopenia   . Allergy   . Heart murmur   . GERD (gastroesophageal reflux disease)   . Anemia   . Hypercalcemia 03/27/2013  . Diastolic dysfunction, grade I 04/12/2013  . Non-Hodgkins lymphoma 04/06/2013    Past Surgical History  Procedure Laterality Date  . Appendectomy    . Colonoscopy  2008  . Spine surgery    . Back surgery      LOWER BACK TWICE  . Abdominal hysterectomy    . Esophagogastroduodenoscopy N/A 03/29/2013    Procedure: ESOPHAGOGASTRODUODENOSCOPY (EGD);  Surgeon: Juanita Craver, MD;  Location: [facility] ENDOSCOPY;  Service: Endoscopy;  Laterality: N/A;  . Inguinal lymph node biopsy Right 03/31/2013    Procedure: INGUINAL LYMPH NODE BIOPSY;  Surgeon: Gwenyth Ober, MD;  Location: [facility];  Service: General;  Laterality: Right;    Allergies: Prednisone and Penicillins  Medications: Prior to Admission medications   Medication Sig Start Date End Date Taking? Authorizing Provider  HYDROcodone-homatropine (HYCODAN) 5-1.5 MG/5ML syrup Take 5 mLs by mouth every 6 (six) hours as needed for cough. 12/28/13  Yes Camelia Eng Tysinger, PA-C  losartan (COZAAR) 25 MG tablet Take 1 tablet (25 mg total) by mouth 2 (two) times daily. 08/12/13  Yes Troy Sine, MD  metoprolol tartrate (LOPRESSOR) 25 MG tablet Take 37.5 mg by mouth 2 (two) times daily. Take 1 1/2 tab twice daily 10/05/13  Yes Denita Lung, MD  Misc Natural Products (OSTEO BI-FLEX ADV DOUBLE ST PO) Take 1 tablet by mouth 2 (two) times daily.   Yes Historical Provider, MD  omeprazole (PRILOSEC) 40 MG capsule Take 40 mg by mouth daily.   Yes Historical Provider, MD  lidocaine-prilocaine (EMLA)  cream Apply 1 application topically as needed. Apply to port site one hour before treatment and cover with plastic wrap. 06/22/13   Concha Norway, MD  omeprazole (PRILOSEC) 40 MG capsule TAKE ONE CAPSULE BY MOUTH EVERY DAY    Denita Lung, MD    Family History  Problem Relation Age of Onset  . Heart attack Father 31  . Leukemia Mother   . Diabetes Brother   . Diabetes Sister     History   Social History  . Marital Status: Married    Spouse Name: [name]    Number of Children: 2  . Years of Education: 12   Occupational History  .  [employer]   Social History Main Topics  . Smoking status: Former Smoker -- 0.50 packs/day for 2 years    Types: Cigarettes    Quit date: 07/30/1960  . Smokeless tobacco: Never Used  . Alcohol Use: Yes     Comment: rare-wine drinker  . Drug Use: No  . Sexual Activity:    Partners: Male   Other Topics Concern  . None   Social History Narrative   Married.  Lives in [place] with husband.  Ambulates with a walker when able.         Review of Systems    Constitutional: Positive for chills. Negative for fever.  Eyes: Positive for visual disturbance.  Respiratory: Negative for shortness of breath.  Occ cough  Cardiovascular: Negative for chest pain.  Gastrointestinal: Negative for nausea, vomiting and abdominal pain.  Genitourinary: Negative for dysuria and hematuria.  Musculoskeletal: Negative for back pain.  Neurological: Positive for headaches.    Vital Signs: BP 186/91 mmHg  Pulse 68  Temp(Src) 97.7 F (36.5 C) (Oral)  Resp 18  SpO2 100%  Physical Exam  Constitutional: She is oriented to person, place, and time. She appears well-developed and well-nourished.  Cardiovascular: Normal rate and regular rhythm.   Pulmonary/Chest: Effort normal and breath sounds normal.  Clean, intact rt chest wall PAC  Abdominal: Soft. Bowel sounds are normal. There is no tenderness.  Musculoskeletal: Normal range of motion. She  exhibits no edema.  Neurological: She is alert and oriented to person, place, and time.    Imaging: No results found.  Labs:  CBC:  Recent Labs  12/04/13 0807 01/01/14 1034 02/05/14 1430 04/16/14 0906  WBC 3.2* 3.5* 4.5 4.0  HGB 10.9* 12.5 12.3 12.8  HCT 32.5* 37.9 36.2 38.4  PLT 62* 70* 102* 88*    COAGS:  Recent Labs  11/19/13 0917 12/04/13 12/04/13 0807 06/08/14 0800  INR 2.57* 2.6 2.60 1.05  APTT 56*  --   --  35    BMP:  Recent Labs  08/21/13 1535 08/22/13 0405 08/23/13 1633 08/26/13 0945  11/05/13 1057 12/04/13 0809 02/05/14 1430 04/16/14 0907  NA 140 138 140 139  < > 144 142 142 142  K 3.7 3.5* 3.6* 3.1*  < > 3.9 3.9 4.3 4.3  CL 104 104 104 101  --   --   --   --   --   CO2 24 25 24 27   < > 26 24 22 27   GLUCOSE 98 83 97 120*  < > 59* 96 90 79  BUN 10 12 11 8   < > 21.5 16.8 26.6* 23.9  CALCIUM 8.4 7.6* 7.9* 8.5  < > 9.3 9.2 9.1 9.1  CREATININE 0.94 1.05 0.84 0.81  < > 1.1 1.2* 1.1 1.3*  GFRNONAA 59* 52* 68* 71*  --   --   --   --   --   GFRAA 69* 60* 79* 82*  --   --   --   --   --   < > = values in this interval not displayed.  LIVER FUNCTION TESTS:  Recent Labs  11/05/13 1057 12/04/13 0809 02/05/14 1430 04/16/14 0907  BILITOT 0.52 0.69 0.51 0.59  AST 26 28 19 17   ALT 17 25 21 12   ALKPHOS 115 108 81 71  PROT 5.6* 5.3* 5.6* 5.9*  ALBUMIN 3.9 3.5 3.7 3.7    TUMOR MARKERS: No results for input(s): AFPTM, CEA, CA199, CHROMGRNA in the last 8760 hours.  Assessment and Plan: MANYA BALASH is a 73 y.o. 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 She presents now for port a cath removal. Due to recent history of HA's/visual disturbances (see note from Dr. Tomi Likens) and impending MRI brain scheduled for 11/23 will postpone port removal for now. Oncology nurse notified of above and plan d/w pt/husband. Dr. Kathlene Cote has also spoken with pt/husband.         Signed: Autumn Messing 06/08/2014, 8:25 AM

## 2014-06-08 NOTE — Progress Notes (Signed)
Procedure to remove port-a-cath canceled today. Pt has spoken and discussed this with Dr.Yamagata and Stacie Glaze and understands reasons for canceling.  Pt is okay with this.   IV site d/c, pressure applied and pressure dressing applied to site.  Pt d/c home ambulatory at this time.  No sedation was given.

## 2014-06-17 ENCOUNTER — Ambulatory Visit (INDEPENDENT_AMBULATORY_CARE_PROVIDER_SITE_OTHER): Payer: Medicare Other | Admitting: Family Medicine

## 2014-06-17 ENCOUNTER — Encounter: Payer: Self-pay | Admitting: Family Medicine

## 2014-06-17 VITALS — BP 120/70 | HR 63 | Wt 129.0 lb

## 2014-06-17 DIAGNOSIS — L570 Actinic keratosis: Secondary | ICD-10-CM | POA: Diagnosis not present

## 2014-06-17 DIAGNOSIS — L989 Disorder of the skin and subcutaneous tissue, unspecified: Secondary | ICD-10-CM | POA: Diagnosis not present

## 2014-06-17 NOTE — Progress Notes (Signed)
   Subjective:    Patient ID: Tonya Wise, female    DOB: 23-Sep-1940, 73 y.o.   MRN: [id number]  HPI She is here for evaluation of a lesion on her left leg started shortly after she finished chemotherapy for treatment of her non-Hodgkin's lymphoma. Over the last several weeks, she has noted a lesion growing.   Review of Systems     Objective:   Physical Exam A 2-3 cm raised lesion with central scabbing is noted in the left mid calf area.       Assessment & Plan:  Leg skin lesion, left  the lesion was injected with Xylocaine and epinephrine and a 2 mm punch biopsy obtained. This could be a pyogenic granuloma or possibly squamous cell cancer.

## 2014-06-18 ENCOUNTER — Other Ambulatory Visit: Payer: Self-pay | Admitting: Family Medicine

## 2014-06-21 ENCOUNTER — Ambulatory Visit (HOSPITAL_COMMUNITY)
Admission: RE | Admit: 2014-06-21 | Discharge: 2014-06-21 | Disposition: A | Discharge status: Home / Self Care | Payer: Medicare Other | Source: Ambulatory Visit | Attending: Neurology | Admitting: Neurology

## 2014-06-21 DIAGNOSIS — R519 Headache, unspecified: Secondary | ICD-10-CM

## 2014-06-21 DIAGNOSIS — R42 Dizziness and giddiness: Secondary | ICD-10-CM | POA: Diagnosis present

## 2014-06-21 DIAGNOSIS — R51 Headache: Secondary | ICD-10-CM | POA: Diagnosis not present

## 2014-06-21 DIAGNOSIS — G4440 Drug-induced headache, not elsewhere classified, not intractable: Secondary | ICD-10-CM

## 2014-06-21 DIAGNOSIS — H538 Other visual disturbances: Secondary | ICD-10-CM

## 2014-06-21 MED ORDER — GADOBENATE DIMEGLUMINE 529 MG/ML IV SOLN
10.0000 mL | Freq: Once | INTRAVENOUS | Status: AC | PRN
  Administered 2014-06-21: 8 mL via INTRAVENOUS

## 2014-06-22 ENCOUNTER — Telehealth: Payer: Self-pay | Admitting: *Deleted

## 2014-06-22 NOTE — Telephone Encounter (Signed)
nortriptyline 10mg  at bedtime  called to pharmacy for patient she was ask  to call office in 4 weeks with update and to schedule a 3 month appt at that tiem MRI was normal

## 2014-06-28 ENCOUNTER — Telehealth: Payer: Self-pay | Admitting: Internal Medicine

## 2014-06-28 ENCOUNTER — Telehealth: Payer: Self-pay | Admitting: *Deleted

## 2014-06-28 NOTE — Telephone Encounter (Signed)
Pt called for a status of her biposy that was taken on the 19th of this month

## 2014-06-28 NOTE — Telephone Encounter (Signed)
Received call from pt stating that she was to have her port removed this November but this was cancelled due to having h/a's & brain scan was ordered which was OK.  She would like to r/s her port removal.  Note to Dr Burr Medico.

## 2014-06-29 ENCOUNTER — Other Ambulatory Visit: Payer: Self-pay | Admitting: *Deleted

## 2014-06-29 DIAGNOSIS — C8590 Non-Hodgkin lymphoma, unspecified, unspecified site: Secondary | ICD-10-CM

## 2014-06-29 NOTE — Telephone Encounter (Signed)
Patient informed word for word and verbalized understanding 

## 2014-06-29 NOTE — Telephone Encounter (Signed)
See if you can find the biopsy report.

## 2014-06-29 NOTE — Telephone Encounter (Signed)
Let her know that the biopsy was negative for cancer. Nothing needs to be done

## 2014-06-29 NOTE — Telephone Encounter (Signed)
They are faxing results over 

## 2014-06-29 NOTE — Patient Instructions (Signed)
Arrive in Radiology at [facility] at 7:30am.  NPO after midnight.  Have a driver available.  Pt denies blood thinners.

## 2014-06-30 DIAGNOSIS — H25012 Cortical age-related cataract, left eye: Secondary | ICD-10-CM | POA: Diagnosis not present

## 2014-06-30 DIAGNOSIS — H2512 Age-related nuclear cataract, left eye: Secondary | ICD-10-CM | POA: Diagnosis not present

## 2014-06-30 DIAGNOSIS — Z961 Presence of intraocular lens: Secondary | ICD-10-CM | POA: Diagnosis not present

## 2014-06-30 DIAGNOSIS — H26491 Other secondary cataract, right eye: Secondary | ICD-10-CM | POA: Diagnosis not present

## 2014-07-01 ENCOUNTER — Other Ambulatory Visit: Payer: Self-pay | Admitting: Radiology

## 2014-07-01 ENCOUNTER — Encounter: Payer: Self-pay | Admitting: Family Medicine

## 2014-07-02 ENCOUNTER — Encounter (HOSPITAL_COMMUNITY): Payer: Self-pay

## 2014-07-02 ENCOUNTER — Ambulatory Visit (HOSPITAL_COMMUNITY)
Admission: RE | Admit: 2014-07-02 | Discharge: 2014-07-02 | Disposition: A | Discharge status: Home / Self Care | Payer: Medicare Other | Source: Ambulatory Visit | Attending: Hematology | Admitting: Hematology

## 2014-07-02 DIAGNOSIS — Z452 Encounter for adjustment and management of vascular access device: Secondary | ICD-10-CM | POA: Diagnosis not present

## 2014-07-02 DIAGNOSIS — K219 Gastro-esophageal reflux disease without esophagitis: Secondary | ICD-10-CM | POA: Diagnosis not present

## 2014-07-02 DIAGNOSIS — Z87891 Personal history of nicotine dependence: Secondary | ICD-10-CM | POA: Diagnosis not present

## 2014-07-02 DIAGNOSIS — I10 Essential (primary) hypertension: Secondary | ICD-10-CM | POA: Diagnosis not present

## 2014-07-02 DIAGNOSIS — M8580 Other specified disorders of bone density and structure, unspecified site: Secondary | ICD-10-CM | POA: Diagnosis not present

## 2014-07-02 DIAGNOSIS — Z8572 Personal history of non-Hodgkin lymphomas: Secondary | ICD-10-CM | POA: Diagnosis not present

## 2014-07-02 LAB — PROTIME-INR
INR: 1.1 (ref 0.00–1.49)
Prothrombin Time: 14.3 seconds (ref 11.6–15.2)

## 2014-07-02 LAB — CBC
HCT: 36.8 % (ref 36.0–46.0)
Hemoglobin: 12.3 g/dL (ref 12.0–15.0)
MCH: 28.1 pg (ref 26.0–34.0)
MCHC: 33.4 g/dL (ref 30.0–36.0)
MCV: 84 fL (ref 78.0–100.0)
PLATELETS: 95 10*3/uL — AB (ref 150–400)
RBC: 4.38 MIL/uL (ref 3.87–5.11)
RDW: 13.6 % (ref 11.5–15.5)
WBC: 4.3 10*3/uL (ref 4.0–10.5)

## 2014-07-02 LAB — APTT: APTT: 39 s — AB (ref 24–37)

## 2014-07-02 MED ORDER — CLINDAMYCIN PHOSPHATE 900 MG/50ML IV SOLN: 900.0000 mg | Freq: Once | INTRAVENOUS | Status: DC

## 2014-07-02 MED ORDER — MIDAZOLAM HCL 2 MG/2ML IJ SOLN
INTRAMUSCULAR | Status: AC | PRN
  Administered 2014-07-02 (×2): 1 mg via INTRAVENOUS

## 2014-07-02 MED ORDER — FENTANYL CITRATE 0.05 MG/ML IJ SOLN
INTRAMUSCULAR | Status: AC | PRN
  Administered 2014-07-02: 50 ug via INTRAVENOUS
  Administered 2014-07-02: 25 ug via INTRAVENOUS

## 2014-07-02 MED ORDER — CLINDAMYCIN PHOSPHATE 600 MG/50ML IV SOLN
600.0000 mg | Freq: Once | INTRAVENOUS | Status: AC
  Administered 2014-07-02: 600 mg via INTRAVENOUS
  Filled 2014-07-02: qty 50

## 2014-07-02 MED ORDER — SODIUM CHLORIDE 0.9 % IV SOLN
INTRAVENOUS | Status: DC
  Administered 2014-07-02: 09:00:00 via INTRAVENOUS

## 2014-07-02 NOTE — Procedures (Signed)
Interventional Radiology Procedure Note  Procedure:  Removal of port catheter.  Complications: No immediate Recommendations:  - Ok to shower tomorrow - Do not submerge for 14 days   Signed,  Dulcy Fanny. Earleen Newport, DO

## 2014-07-02 NOTE — Progress Notes (Signed)
Computer system down pre-procedure port removal. RN charted on paper and will be sent to medical records. Post procedure recorded in computer. Patient d/c home with husband at 1206.

## 2014-07-02 NOTE — H&P (Signed)
Chief Complaint: "I'm here to get my port out"  Referring Physician(s): Feng,Yan  History of Present Illness: Tonya Wise is a 73 y.o. 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 She presents now for port a cath removal.   Past Medical History  Diagnosis Date  . Hypertension   . Renal insufficiency   . Osteopenia   . Allergy   . Heart murmur   . GERD (gastroesophageal reflux disease)   . Anemia   . Hypercalcemia 03/27/2013  . Diastolic dysfunction, grade I 04/12/2013  . Non-Hodgkins lymphoma 04/06/2013    Past Surgical History  Procedure Laterality Date  . Appendectomy    . Colonoscopy  2008  . Spine surgery    . Back surgery      LOWER BACK TWICE  . Abdominal hysterectomy    . Esophagogastroduodenoscopy N/A 03/29/2013    Procedure: ESOPHAGOGASTRODUODENOSCOPY (EGD);  Surgeon: Juanita Craver, MD;  Location: [facility] ENDOSCOPY;  Service: Endoscopy;  Laterality: N/A;  . Inguinal lymph node biopsy Right 03/31/2013    Procedure: INGUINAL LYMPH NODE BIOPSY;  Surgeon: Gwenyth Ober, MD;  Location: [place];  Service: General;  Laterality: Right;    Allergies: Prednisone and Penicillins  Medications: Prior to Admission medications   Medication Sig Start Date End Date Taking? Authorizing Provider  HYDROcodone-homatropine (HYCODAN) 5-1.5 MG/5ML syrup Take 5 mLs by mouth every 6 (six) hours as needed for cough. 12/28/13   Camelia Eng Tysinger, PA-C  lidocaine-prilocaine (EMLA) cream Apply 1 application topically as needed. Apply to port site one hour before treatment and cover with plastic wrap. 06/22/13   Concha Norway, MD  losartan (COZAAR) 25 MG tablet Take 1 tablet (25 mg total) by mouth 2 (two) times daily. 08/12/13   Troy Sine, MD  metoprolol tartrate (LOPRESSOR) 25 MG tablet Take 37.5 mg by mouth 2 (two) times daily. Take 1 1/2 tab twice daily 10/05/13   Denita Lung, MD  metoprolol tartrate (LOPRESSOR) 25 MG tablet TAKE ONE TABLET BY MOUTH TWICE DAILY. 06/18/14   Denita Lung, MD  Misc Natural Products (OSTEO BI-FLEX ADV DOUBLE ST PO) Take 1 tablet by mouth 2 (two) times daily.    Historical Provider, MD  omeprazole (PRILOSEC) 40 MG capsule TAKE ONE CAPSULE BY MOUTH EVERY DAY    Denita Lung, MD    Family History  Problem Relation Age of Onset  . Heart attack Father 71  . Leukemia Mother   . Diabetes Brother   . Diabetes Sister     History   Social History  . Marital Status: Married    Spouse Name: [name]    Number of Children: 2  . Years of Education: 12   Occupational History  .  [employer]   Social History Main Topics  . Smoking status: Former Smoker -- 0.50 packs/day for 2 years    Types: Cigarettes    Quit date: 07/30/1960  . Smokeless tobacco: Never Used  . Alcohol Use: Yes     Comment: rare-wine drinker  . Drug Use: No  . Sexual Activity:    Partners: Male   Other Topics Concern  . Not on file   Social History Narrative   Married.  Lives in [place] with husband.  Ambulates with a walker when able.         Review of Systems  Constitutional: Negative for fever and chills.  Respiratory: Negative for shortness of breath.  Occ dry cough  Cardiovascular: Negative for chest pain.  Gastrointestinal: Negative for nausea, vomiting, abdominal pain and blood in stool.  Genitourinary: Negative for dysuria and hematuria.  Musculoskeletal: Positive for back pain.  Neurological: Negative for headaches.    Vital Signs: BP 168/81  HR 65  R 16  TEMP 97.4  O2 SATS 98% RA Physical Exam  Constitutional: She is oriented to person, place, and time. She appears well-developed and well-nourished.  Cardiovascular: Normal rate and regular rhythm.   Pulmonary/Chest: Effort normal and breath sounds normal.  Clean, intact rt chest wall PAC  Abdominal: Soft. Bowel sounds are normal. There is no tenderness.  Musculoskeletal: Normal range of motion. She exhibits no edema.  Neurological: She is alert and oriented to person,  place, and time.    Imaging: Mr Jeri Cos [place] Contrast  06/21/2014   CLINICAL DATA:  Headache with sharp temporal pain. Blurred vision. Vertigo.  EXAM: MRI HEAD WITHOUT AND WITH CONTRAST  TECHNIQUE: Multiplanar, multiecho pulse sequences of the brain and surrounding structures were obtained without and with intravenous contrast.  CONTRAST:  54mL MULTIHANCE GADOBENATE DIMEGLUMINE 529 MG/ML IV SOLN  COMPARISON:  CT head without contrast 04/14/2013. MRI brain without and with contrast 11/26/2004  FINDINGS: Scattered periventricular and subcortical white matter changes bilaterally demonstrate some progression over the last 9 years. A focal area of T2 shine through is present in the subcortical posterior right frontal lobe. No acute infarct, hemorrhage, or mass lesion is present. Midline structures are within normal limits. The ventricles are of normal size. No significant extraaxial fluid collection is present.  Flow is present in the major intracranial arteries. The patient is status post right lens replacement. The globes and orbits are otherwise intact. The paranasal sinuses and mastoid air cells are clear.  The postcontrast images demonstrate no pathologic enhancement.  IMPRESSION: 1. No acute intracranial abnormality. 2. 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 This likely reflects the sequela of chronic microvascular ischemia. 3. No pathologic enhancement to suggest intracranial metastatic disease.   Electronically Signed   By: Lawrence Santiago M.D.   On: 06/21/2014 10:37    Labs:  CBC:  Recent Labs  01/01/14 1034 02/05/14 1430 04/16/14 0906 06/08/14 0800  WBC 3.5* 4.5 4.0 4.9  HGB 12.5 12.3 12.8 12.9  HCT 37.9 36.2 38.4 38.5  PLT 70* 102* 88* 91*    COAGS:  Recent Labs  11/19/13 0917 12/04/13 12/04/13 0807 06/08/14 0800  INR 2.57* 2.6 2.60 1.05  APTT 56*  --   --  35    BMP:  Recent Labs  08/22/13 0405 08/23/13 1633 08/26/13 0945  12/04/13 0809 02/05/14 1430  04/16/14 0907 06/08/14 0800  NA 138 140 139  < > 142 142 142 143  K 3.5* 3.6* 3.1*  < > 3.9 4.3 4.3 3.7  CL 104 104 101  --   --   --   --  103  CO2 25 24 27   < > 24 22 27 26   GLUCOSE 83 97 120*  < > 96 90 79 97  BUN 12 11 8   < > 16.8 26.6* 23.9 18  CALCIUM 7.6* 7.9* 8.5  < > 9.2 9.1 9.1 9.4  CREATININE 1.05 0.84 0.81  < > 1.2* 1.1 1.3* 1.13*  GFRNONAA 52* 68* 71*  --   --   --   --  47*  GFRAA 60* 79* 82*  --   --   --   --  54*  < > =  values in this interval not displayed.  LIVER FUNCTION TESTS:  Recent Labs  11/05/13 1057 12/04/13 0809 02/05/14 1430 04/16/14 0907  BILITOT 0.52 0.69 0.51 0.59  AST 26 28 19 17   ALT 17 25 21 12   ALKPHOS 115 108 81 71  PROT 5.6* 5.3* 5.6* 5.9*  ALBUMIN 3.9 3.5 3.7 3.7    TUMOR MARKERS: No results for input(s): AFPTM, CEA, CA199, CHROMGRNA in the last 8760 hours.  Assessment and Plan: Tonya Wise is a 73 y.o. 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 She presents now for port a cath removal. Recent MRI brain was negative for acute abnormality.          Signed: Autumn Messing 07/02/2014, 8:24 AM

## 2014-07-02 NOTE — Discharge Instructions (Signed)
Incision Care °An incision is when a surgeon cuts into your body tissues. After surgery, the incision needs to be cared for properly to prevent infection.  °HOME CARE INSTRUCTIONS  °· Take all medicine as directed by your caregiver. Only take over-the-counter or prescription medicines for pain, discomfort, or fever as directed by your caregiver. °· Do not remove your bandage (dressing) or get your incision wet until your surgeon gives you permission. In the event that your dressing becomes wet, dirty, or starts to smell, change the dressing and call your surgeon for instructions as soon as possible. °· Take showers. Do not take tub baths, swim, or do anything that may soak the wound until it is healed. °· Resume your normal diet and activities as directed or allowed. °· Avoid lifting any weight until you are instructed otherwise. °· Use anti-itch antihistamine medicine as directed by your caregiver. The wound may itch when it is healing. Do not pick or scratch at the wound. °· Follow up with your caregiver for stitch (suture) or staple removal as directed. °· Drink enough fluids to keep your urine clear or pale yellow. °SEEK MEDICAL CARE IF:  °· You have redness, swelling, or increasing pain in the wound that is not controlled with medicine. °· You have drainage, blood, or pus coming from the wound that lasts longer than 1 day. °· You develop muscle aches, chills, or a general ill feeling. °· You notice a bad smell coming from the wound or dressing. °· Your wound edges separate after the sutures, staples, or skin adhesive strips have been removed. °· You develop persistent nausea or vomiting. °SEEK IMMEDIATE MEDICAL CARE IF:  °· You have a fever. °· You develop a rash. °· You develop dizzy episodes or faint while standing. °· You have difficulty breathing. °· You develop any reaction or side effects to medicine given. °MAKE SURE YOU:  °· Understand these instructions. °· Will watch your condition. °· Will get help  right away if you are not doing well or get worse. °Document Released: 02/02/2005 Document Revised: 10/08/2011 Document Reviewed: 09/09/2013 °ExitCare® Patient Information ©2015 [employer]. This information is not intended to replace advice given to you by your health care provider. Make sure you discuss any questions you have with your health care provider. °Conscious Sedation °Sedation is the use of medicines to promote relaxation and relieve discomfort and anxiety. Conscious sedation is a type of sedation. Under conscious sedation you are less alert than normal but are still able to respond to instructions or stimulation. Conscious sedation is used during short medical and dental procedures. It is milder than deep sedation or general anesthesia and allows you to return to your regular activities sooner.  °LET YOUR HEALTH CARE PROVIDER KNOW ABOUT:  °· Any allergies you have. °· All medicines you are taking, including vitamins, herbs, eye drops, creams, and over-the-counter medicines. °· Use of steroids (by mouth or creams). °· Previous problems you or members of your family have had with the use of anesthetics. °· Any blood disorders you have. °· Previous surgeries you have had. °· Medical conditions you have. °· Possibility of pregnancy, if this applies. °· Use of cigarettes, alcohol, or illegal drugs. °RISKS AND COMPLICATIONS °Generally, this is a safe procedure. However, as with any procedure, problems can occur. Possible problems include: °· Oversedation. °· Trouble breathing on your own. You may need to have a breathing tube until you are awake and breathing on your own. °· Allergic reaction to any of   the medicines used for the procedure. °BEFORE THE PROCEDURE °· You may have blood tests done. These tests can help show how well your kidneys and liver are working. They can also show how well your blood clots. °· A physical exam will be done.   °· Only take medicines as directed by your health care provider.  You may need to stop taking medicines (such as blood thinners, aspirin, or nonsteroidal anti-inflammatory drugs) before the procedure.   °· Do not eat or drink at least 6 hours before the procedure or as directed by your health care provider. °· Arrange for a responsible adult, family member, or friend to take you home after the procedure. He or she should stay with you for at least 24 hours after the procedure, until the medicine has worn off. °PROCEDURE  °· An intravenous (IV) catheter will be inserted into one of your veins. Medicine will be able to flow directly into your body through this catheter. You may be given medicine through this tube to help prevent pain and help you relax. °· The medical or dental procedure will be done. °AFTER THE PROCEDURE °· You will stay in a recovery area until the medicine has worn off. Your blood pressure and pulse will be checked.   °·  Depending on the procedure you had, you may be allowed to go home when you can tolerate liquids and your pain is under control. °Document Released: 04/10/2001 Document Revised: 07/21/2013 Document Reviewed: 03/23/2013 °ExitCare® Patient Information ©2015 [employer]. This information is not intended to replace advice given to you by your health care provider. Make sure you discuss any questions you have with your health care provider. ° °

## 2014-07-08 ENCOUNTER — Ambulatory Visit (HOSPITAL_COMMUNITY): Payer: Medicare Other

## 2014-07-08 ENCOUNTER — Inpatient Hospital Stay (HOSPITAL_COMMUNITY)
Admission: RE | Admit: 2014-07-08 | Discharge: 2014-07-08 | Disposition: A | Discharge status: Home / Self Care | Payer: Medicare Other | Source: Ambulatory Visit | Attending: Hematology | Admitting: Hematology

## 2014-07-08 DIAGNOSIS — C8590 Non-Hodgkin lymphoma, unspecified, unspecified site: Secondary | ICD-10-CM

## 2014-07-13 ENCOUNTER — Ambulatory Visit (INDEPENDENT_AMBULATORY_CARE_PROVIDER_SITE_OTHER): Payer: Medicare Other | Admitting: Family Medicine

## 2014-07-13 ENCOUNTER — Encounter: Payer: Self-pay | Admitting: Family Medicine

## 2014-07-13 ENCOUNTER — Telehealth: Payer: Self-pay | Admitting: Family Medicine

## 2014-07-13 VITALS — BP 130/88 | HR 78 | Ht 62.0 in | Wt 128.0 lb

## 2014-07-13 DIAGNOSIS — K219 Gastro-esophageal reflux disease without esophagitis: Secondary | ICD-10-CM

## 2014-07-13 DIAGNOSIS — I429 Cardiomyopathy, unspecified: Secondary | ICD-10-CM

## 2014-07-13 DIAGNOSIS — I5189 Other ill-defined heart diseases: Secondary | ICD-10-CM

## 2014-07-13 DIAGNOSIS — D696 Thrombocytopenia, unspecified: Secondary | ICD-10-CM

## 2014-07-13 DIAGNOSIS — I5043 Acute on chronic combined systolic (congestive) and diastolic (congestive) heart failure: Secondary | ICD-10-CM

## 2014-07-13 DIAGNOSIS — C8590 Non-Hodgkin lymphoma, unspecified, unspecified site: Secondary | ICD-10-CM

## 2014-07-13 DIAGNOSIS — I519 Heart disease, unspecified: Secondary | ICD-10-CM | POA: Diagnosis not present

## 2014-07-13 DIAGNOSIS — I10 Essential (primary) hypertension: Secondary | ICD-10-CM | POA: Diagnosis not present

## 2014-07-13 DIAGNOSIS — S41111A Laceration without foreign body of right upper arm, initial encounter: Secondary | ICD-10-CM

## 2014-07-13 MED ORDER — OMEPRAZOLE 40 MG PO CPDR: 40.0000 mg | DELAYED_RELEASE_CAPSULE | Freq: Every day | ORAL | Status: DC

## 2014-07-13 MED ORDER — METOPROLOL TARTRATE 25 MG PO TABS: 37.5000 mg | ORAL_TABLET | Freq: Two times a day (BID) | ORAL | Status: DC

## 2014-07-13 MED ORDER — LOSARTAN POTASSIUM 25 MG PO TABS: 25.0000 mg | ORAL_TABLET | Freq: Two times a day (BID) | ORAL | Status: DC

## 2014-07-13 NOTE — Telephone Encounter (Signed)
Requesting written script/order for Boostrix 0.63ml vaccine that pt receive at their facility so that this can be billed to pt's Medicare insurance. Fax to [facility] @ [phone]

## 2014-07-13 NOTE — Progress Notes (Signed)
Subjective:     Patient ID: Tonya Wise, female   DOB: 01-Oct-1940, 73 y.o.   MRN: [id number]  HPI  This is a 73 year old female with a history of HTN, CHF, GERD, and stage IV NHL who has completed a year long course of chemotherapy.  Her portacath was removed last month upon completion of her course.  She has had brain MRI, CT chest, and CT AP over the last few months with no evidence of recurrent disease.  She will follow-up wit her oncologist next month.  She has no major current complaints.  She received a diagnosis of heart failure per her cardiologist in December 2014.  Echo in August 2015 showed EF 35-45% with some diastolic dysfunction & minimal mitral regurgitation.  She plans to follow-up wit Dr. Claiborne Billings for this.  She denies significant dyspnea, chest pain, palpitations, loss of consciousness, dizziness, or limitation of ADL.  She has a lesion on her left calf that returned as a benign actinic keratosis with reactive inflammation on path but will not heal and causes her discomfort when she bangs it on objects.  Her current gastric reflux symptoms are well controlled with omeprazole.   She's compliant with her current medications.  No other acute complaints.  Preventative services: She has received her annual flu shot.  Her last pneumonia vaccine was 1 year ago.  She recently had a puncture wound to her right arm and would like tetanus coverage.  She is a non-smoker who rarely drinks alcohol.  Her husband is retired and is caring for her.  She feels safe in her relationships and her mood is good.  Her PMH, history, allergies, medications were reviewed in the EMR.  Review of Systems  All other systems reviewed and are negative.      Objective:   Physical Exam  BP 130/88 mmHg  Pulse 78  Ht 5\' 2"  (1.575 m)  Wt 128 lb (58.06 kg)  BMI 23.41 kg/m2  SpO2 98%  General Appearance:    Alert, cooperative, no distress, appears stated age  Head:    Normocephalic, without obvious  abnormality, atraumatic  Eyes:    PERRL, conjunctiva/corneas clear, EOM's intact, fundi    benign, both eyes  Ears:    Normal TM's and external ear canals, both ears  Nose:   Nares normal, septum midline, mucosa normal, no drainage    or sinus tenderness  Throat:   Lips, mucosa, and tongue normal; teeth and gums normal  Neck:   Supple, symmetrical, trachea midline, no adenopathy;    thyroid:  no enlargement/tenderness/nodules; no carotid   bruit or JVD  Back:     Symmetric, no curvature, ROM normal, no CVA tenderness  Lungs:     Clear to auscultation bilaterally, respirations unlabored  Chest Wall:    No tenderness or deformity   Heart:    Regular rate and rhythm, S1 and S2 normal, no murmur, rub   or gallop  Abdomen:     Soft, non-tender, bowel sounds active all four quadrants,    no masses, no organomegaly  Extremities:   Extremities normal, atraumatic, no cyanosis or edema  Pulses:   2+ and symmetric all extremities  Skin:   Skin color, texture, turgor normal, no rashes. actinic keratosis lesion noted on the left calf with some scabbing   Lymph nodes:   Cervical, supraclavicular, and axillary nodes normal  Neurologic:   CNII-XII intact, normal strength, sensation and reflexes    throughout  Assessment & Plan:     Essential hypertension - Plan: metoprolol tartrate (LOPRESSOR) 25 MG tablet, losartan (COZAAR) 25 MG tablet  Non-Hodgkins lymphoma  Acute on chronic combined systolic and diastolic heart failure - Plan: metoprolol tartrate (LOPRESSOR) 25 MG tablet, losartan (COZAAR) 25 MG tablet  Thrombocytopenia  Diastolic dysfunction grade 2 - Plan: metoprolol tartrate (LOPRESSOR) 25 MG tablet, losartan (COZAAR) 25 MG tablet  Cardiomyopathy  Gastroesophageal reflux disease without esophagitis - Plan: omeprazole (PRILOSEC) 40 MG capsule  She is following with her oncologist for her lymphoma.  Her cardioogist is managing her subclinical heart failure.  Otherwise her health  maintenance is up to date.  Her hypertension and GERD are well controlled.  She has received routine blood work and no further is necessary today.   Prescription written for tetanus shot. Encouraged benign neglect concerning the actinic keratosis

## 2014-07-15 NOTE — Telephone Encounter (Signed)
I have called the [facility] spoke with the pharmacist and the order is in the computer for her to have the tdap at a cost of 30.00 i have let the pt know also

## 2014-07-15 NOTE — Telephone Encounter (Signed)
Take care of this 

## 2014-07-28 ENCOUNTER — Telehealth: Payer: Self-pay | Admitting: Internal Medicine

## 2014-07-28 NOTE — Telephone Encounter (Signed)
, °

## 2014-08-05 ENCOUNTER — Other Ambulatory Visit: Payer: Medicare Other

## 2014-08-05 ENCOUNTER — Ambulatory Visit: Payer: Medicare Other

## 2014-08-14 ENCOUNTER — Other Ambulatory Visit: Payer: Self-pay | Admitting: Neurology

## 2014-08-16 ENCOUNTER — Other Ambulatory Visit: Payer: Self-pay | Admitting: Family Medicine

## 2014-08-20 ENCOUNTER — Ambulatory Visit: Payer: Medicare Other

## 2014-08-20 ENCOUNTER — Other Ambulatory Visit: Payer: Medicare Other

## 2014-08-23 DIAGNOSIS — H26491 Other secondary cataract, right eye: Secondary | ICD-10-CM | POA: Diagnosis not present

## 2014-08-23 DIAGNOSIS — Z961 Presence of intraocular lens: Secondary | ICD-10-CM | POA: Diagnosis not present

## 2014-08-25 ENCOUNTER — Other Ambulatory Visit: Payer: Self-pay | Admitting: Medical Oncology

## 2014-08-25 DIAGNOSIS — C8590 Non-Hodgkin lymphoma, unspecified, unspecified site: Secondary | ICD-10-CM

## 2014-08-26 ENCOUNTER — Ambulatory Visit (HOSPITAL_BASED_OUTPATIENT_CLINIC_OR_DEPARTMENT_OTHER): Payer: Medicare Other | Admitting: Internal Medicine

## 2014-08-26 ENCOUNTER — Other Ambulatory Visit (HOSPITAL_BASED_OUTPATIENT_CLINIC_OR_DEPARTMENT_OTHER): Payer: Medicare Other

## 2014-08-26 ENCOUNTER — Telehealth: Payer: Self-pay | Admitting: Internal Medicine

## 2014-08-26 VITALS — BP 197/82 | HR 71 | Temp 98.0°F | Resp 18 | Ht 62.0 in | Wt 129.8 lb

## 2014-08-26 DIAGNOSIS — C8590 Non-Hodgkin lymphoma, unspecified, unspecified site: Secondary | ICD-10-CM | POA: Diagnosis not present

## 2014-08-26 DIAGNOSIS — C8599 Non-Hodgkin lymphoma, unspecified, extranodal and solid organ sites: Secondary | ICD-10-CM | POA: Diagnosis not present

## 2014-08-26 DIAGNOSIS — D696 Thrombocytopenia, unspecified: Secondary | ICD-10-CM

## 2014-08-26 LAB — CBC WITH DIFFERENTIAL/PLATELET
BASO%: 0.6 % (ref 0.0–2.0)
BASOS ABS: 0 10*3/uL (ref 0.0–0.1)
EOS%: 1.2 % (ref 0.0–7.0)
Eosinophils Absolute: 0.1 10*3/uL (ref 0.0–0.5)
HCT: 38.6 % (ref 34.8–46.6)
HGB: 12.4 g/dL (ref 11.6–15.9)
LYMPH%: 34.9 % (ref 14.0–49.7)
MCH: 27.7 pg (ref 25.1–34.0)
MCHC: 32 g/dL (ref 31.5–36.0)
MCV: 86.6 fL (ref 79.5–101.0)
MONO#: 0.4 10*3/uL (ref 0.1–0.9)
MONO%: 8.8 % (ref 0.0–14.0)
NEUT#: 2.3 10*3/uL (ref 1.5–6.5)
NEUT%: 54.5 % (ref 38.4–76.8)
Platelets: 122 10*3/uL — ABNORMAL LOW (ref 145–400)
RBC: 4.46 10*6/uL (ref 3.70–5.45)
RDW: 15.2 % — ABNORMAL HIGH (ref 11.2–14.5)
WBC: 4.3 10*3/uL (ref 3.9–10.3)
lymph#: 1.5 10*3/uL (ref 0.9–3.3)

## 2014-08-26 LAB — COMPREHENSIVE METABOLIC PANEL (CC13)
ALT: 12 U/L (ref 0–55)
ANION GAP: 11 meq/L (ref 3–11)
AST: 14 U/L (ref 5–34)
Albumin: 3.7 g/dL (ref 3.5–5.0)
Alkaline Phosphatase: 78 U/L (ref 40–150)
BUN: 16.6 mg/dL (ref 7.0–26.0)
CHLORIDE: 108 meq/L (ref 98–109)
CO2: 26 mEq/L (ref 22–29)
Calcium: 9 mg/dL (ref 8.4–10.4)
Creatinine: 1.2 mg/dL — ABNORMAL HIGH (ref 0.6–1.1)
EGFR: 45 mL/min/{1.73_m2} — ABNORMAL LOW (ref >90)
GLUCOSE: 138 mg/dL (ref 70–140)
POTASSIUM: 4.6 meq/L (ref 3.5–5.1)
Sodium: 145 mEq/L (ref 136–145)
Total Bilirubin: 0.62 mg/dL (ref 0.20–1.20)
Total Protein: 5.5 g/dL — ABNORMAL LOW (ref 6.4–8.3)

## 2014-08-26 NOTE — Telephone Encounter (Signed)
, °

## 2014-08-26 NOTE — Progress Notes (Signed)
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  PRIOR THERAPY:   Non-Hodgkins lymphoma   03/27/2013 - 03/31/2013 Hospital Admission Admitted to [facility] for profound anemia (Hgb 6.4).  C/o fevers, drenching nightsweats, weight lost.  Negative colonoscopy and EGD.  CT of C/A/P demonstrated significant lymphadenopathy suspcious of ympha.     03/31/2013 Initial Diagnosis Biopsy of R inguinal lymph node revealed Non-Hodgkins lymphoma. Diffuse Large B-cell Lymphoma   04/10/2013 - 04/24/2013 Hospital Admission Hypercalcemia, renal failure and dehydration.  Started min-RCHOP.    04/15/2013 Bone Marrow Biopsy Involvement with Diffuse Large B-cell Lymphoma. Stage IV NHL w B-symptoms.    04/18/2013 - 04/18/2013 Chemotherapy Mini R-CHOP Cycle #1 on 04/18/2013. Dose reduced to poor nutrion. It consisted on Cytoxan 400 mg/m2,Doxorubicin 25 mg/m2, Rituximab 375 mg/m2, Vincristine 60m.  Prednisone 100 mg daily for 9/13 - 9/16.   Received neupogen on 9/21, 9/27 -9/30.     04/25/2013 - 04/29/2013 Hospital Admission Admitted due to worsening mouth sores/oral thrush, odynophagia, failure to thrive.  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  [facility](Dr[facility] CGwynneth Munson and discharged to home on 05/19/2013.     06/01/2013 - 06/01/2013 Chemotherapy Mini R-CHO #2 dosed as above.  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  Received neulasta shot on 06/02/13   06/16/2013 Cancer Staging Re-staging PET/CT demonstrated significant interval reduction in axillary lymphadenopathy.  There was 1 small residual right axillary lymph node measuring 16 x 12 mm which showed FDG uptake.  Signifcant reduction in mediastinal and hilar lymphadenopathy.    06/16/2013 Imaging CT of chest noted left-sided pulmonary embolim. Started on lovenox to coumadin bridge.     06/22/2013 - 06/22/2013 Chemotherapy Mini-R-CHO #3.  Received Neulasta on 06/23/2013.     07/16/2013 Imaging 2-D Echocardiogram demonstrates 35-45% EF with Grade 2 diastolic dysfunction.  Doxorubicin discontinued with consultation by cardiology (Dr. KClaiborne Billings.     08/19/2013 - 08/21/2013 Chemotherapy Mini-R-CEO #4.  Etoposide 25 mg/m2 instead of doxo due to above.  Etoposide 570mbid on day 2/3.  On day 3 (08/21/2013) she received 1 of two oral doses of ectoposide.    08/21/2013 - 08/26/2013 Hospital Admission [facility] to acute respiratory failure and had influenzae A and pneumonia. Completed tamiflu and oral antibiotics.    09/25/2013 - 09/27/2013 Chemotherapy Mini-R-CEO #5. Etoposide 25 mg/m2 25 mg bid on days 2/3.    10/16/2013 - 10/19/2013 Chemotherapy Mini-R-CEO # 6. Etoposide 25 mg/m2 bid on days 2/3. Neulasta on day#4.    11/05/2013 Imaging Interval resolution of the hypermetabolism identified within the enlarged right axillary lymph node seen previously. This lymph node has also decreased markedly in size in the interval. No new hypermetabolic lesions in the neck, chest, abdomen, or  pelvis.    11/19/2013 Bone Marrow Biopsy There is no evidence of a B-cell lymphoproliferative process in this material. Normal cytogenetics.    04/16/2014 Imaging CT scan Chest, abdomen: No evidence of recurrent lymphoma within the chest or abdomen.   Significant decrease in splenomegaly since prior exam.     CURRENT THERAPY: Observation.  INTERVAL HISTORY: Tonya DOLECKI89.o. female returns to the clinic today for follow-up visit accompanied by her daughter. She is a former patient of Dr. ChJuliann Mulend Dr. SeLona Kettleho is  here today to establish care with me after these 2 physicians in the practice. The patient was diagnosed with large B-cell non-Hodgkin lymphoma in September 2014. 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 Adriamycin was also discontinued and replaced with etoposide after the patient has evidence of cardiomyopathy. She has been observation for the last few months with no significant complaints. Her last imaging studies where an September 2015. The patient is feeling fine with no specific complaints. She denied having any significant chest pain, shortness breath, cough or hemoptysis. The patient denied having any nausea or vomiting. She denied having any significant weight loss or night sweats.  MEDICAL HISTORY: Past Medical History  Diagnosis Date  . Hypertension   . Renal insufficiency   . Osteopenia   . Allergy   . Heart murmur   . GERD (gastroesophageal reflux disease)   . Anemia   . Hypercalcemia 03/27/2013  . Diastolic dysfunction, grade I 04/12/2013  . Non-Hodgkins lymphoma 04/06/2013    ALLERGIES:  is allergic to prednisone and penicillins.  MEDICATIONS:  Current Outpatient Prescriptions  Medication Sig Dispense Refill  . lidocaine-prilocaine (EMLA) cream Apply 1 application topically as needed. Apply to port site one hour before treatment and cover with plastic wrap. 30 g 3  . losartan (COZAAR) 25 MG tablet Take 1 tablet (25 mg total) by mouth 2 (two) times daily. 180 tablet 3  . metoprolol tartrate (LOPRESSOR) 25 MG tablet Take 1.5 tablets (37.5 mg total) by mouth 2 (two) times daily. Take 1 1/2 tab twice daily 270 tablet 3  . omeprazole (PRILOSEC) 40 MG capsule Take 1 capsule (40 mg total) by mouth daily. 90 capsule 3  . HYDROcodone-homatropine (HYCODAN) 5-1.5 MG/5ML syrup Take 5 mLs by mouth every 6 (six) hours as needed for cough. (Patient not taking: Reported on 08/26/2014) 120 mL 0   No current facility-administered medications for this visit.    SURGICAL HISTORY:  Past Surgical History  Procedure Laterality Date  . Appendectomy    . Colonoscopy  2008  . Spine  surgery    . Back surgery      LOWER BACK TWICE  . Abdominal hysterectomy    . Esophagogastroduodenoscopy N/A 03/29/2013    Procedure: ESOPHAGOGASTRODUODENOSCOPY (EGD);  Surgeon: Juanita Craver, MD;  Location: [facility] ENDOSCOPY;  Service: Endoscopy;  Laterality: N/A;  . Inguinal lymph node biopsy Right 03/31/2013    Procedure: INGUINAL LYMPH NODE BIOPSY;  Surgeon: Gwenyth Ober, MD;  Location: [facility];  Service: General;  Laterality: Right;    REVIEW OF SYSTEMS:  Constitutional: negative Eyes: negative Ears, nose, mouth, throat, and face: negative Respiratory: negative Cardiovascular: negative Gastrointestinal: negative Genitourinary:negative Integument/breast: negative Hematologic/lymphatic: negative Musculoskeletal:negative Neurological: negative Behavioral/Psych: negative Endocrine: negative Allergic/Immunologic: negative   PHYSICAL EXAMINATION: General appearance: alert, cooperative and no distress Head: Normocephalic, without obvious abnormality, atraumatic Neck: no adenopathy, no JVD, supple, symmetrical, trachea midline and thyroid not enlarged, symmetric, no tenderness/mass/nodules Lymph nodes: Cervical, supraclavicular, and axillary nodes normal. Resp: clear to auscultation bilaterally Back: symmetric, no curvature. ROM normal. No CVA tenderness. Cardio: regular rate and rhythm, S1, S2 normal, no murmur, click, rub or gallop GI: soft, non-tender; bowel sounds normal; no masses,  no organomegaly Extremities: extremities normal, atraumatic, no cyanosis or edema Neurologic: Alert and oriented X 3, normal strength and tone. Normal symmetric reflexes. Normal coordination and gait  ECOG PERFORMANCE STATUS: 1 - Symptomatic but completely ambulatory  Blood pressure 197/82, pulse 71, temperature 98  F (36.7 C), temperature source Oral, resp. rate 18, height '5\' 2"'  (1.575 m), weight 129 lb 12.8 oz (58.877 kg), SpO2 100 %.  LABORATORY DATA: Lab Results  Component Value Date   WBC 4.3  08/26/2014   HGB 12.4 08/26/2014   HCT 38.6 08/26/2014   MCV 86.6 08/26/2014   PLT 122* 08/26/2014      Chemistry      Component Value Date/Time   NA 145 08/26/2014 0957   NA 143 06/08/2014 0800   K 4.6 08/26/2014 0957   K 3.7 06/08/2014 0800   CL 103 06/08/2014 0800   CO2 26 08/26/2014 0957   CO2 26 06/08/2014 0800   BUN 16.6 08/26/2014 0957   BUN 18 06/08/2014 0800   CREATININE 1.2* 08/26/2014 0957   CREATININE 1.13* 06/08/2014 0800   CREATININE 0.85 07/17/2013 0834      Component Value Date/Time   CALCIUM 9.0 08/26/2014 0957   CALCIUM 9.4 06/08/2014 0800   ALKPHOS 78 08/26/2014 0957   ALKPHOS 59 08/21/2013 1535   AST 14 08/26/2014 0957   AST 20 08/21/2013 1535   ALT 12 08/26/2014 0957   ALT 8 08/21/2013 1535   BILITOT 0.62 08/26/2014 0957   BILITOT 0.6 08/21/2013 1535       RADIOGRAPHIC STUDIES: No results found.  ASSESSMENT AND PLAN: This is a very pleasant 74 years old white female with history of stage IV large B-cell non-Hodgkin lymphoma status post systemic chemotherapy initially with the CHOP Rituxan but this was modified secondary to steroid-induced psychosis as well as cardiomyopathy. She has been observation for the last few months with no significant complaints. Her last imaging studies were in September 2015 with no evidence for disease progression I had a lengthy discussion with the patient and her daughter today about her current disease status and treatment options. The patient is feeling fine with no specific complaints.  I recommended for the patient to continue on observation. I will arrange for the patient to have repeat CT scan of the chest, abdomen and pelvis in 3 months. She would come back for follow-up visit at that time. She was advised to call immediately if she has any concerning symptoms in the interval. The patient voices understanding of current disease status and treatment options and is in agreement with the current care plan.  All  questions were answered. The patient knows to call the clinic with any problems, questions or concerns. We can certainly see the patient much sooner if necessary.  I spent 20 minutes counseling the patient face to face. The total time spent in the appointment was 30 minutes.  Disclaimer: This note was dictated with voice recognition software. Similar sounding words can inadvertently be transcribed and may not be corrected upon review.

## 2014-08-28 ENCOUNTER — Encounter: Payer: Self-pay | Admitting: Internal Medicine

## 2014-09-17 ENCOUNTER — Ambulatory Visit (INDEPENDENT_AMBULATORY_CARE_PROVIDER_SITE_OTHER): Payer: Medicare Other | Admitting: Cardiovascular Disease

## 2014-09-17 ENCOUNTER — Encounter: Payer: Self-pay | Admitting: Cardiovascular Disease

## 2014-09-17 VITALS — BP 174/86 | HR 53 | Ht 62.0 in | Wt 131.5 lb

## 2014-09-17 DIAGNOSIS — D696 Thrombocytopenia, unspecified: Secondary | ICD-10-CM

## 2014-09-17 DIAGNOSIS — E785 Hyperlipidemia, unspecified: Secondary | ICD-10-CM | POA: Diagnosis not present

## 2014-09-17 DIAGNOSIS — C8590 Non-Hodgkin lymphoma, unspecified, unspecified site: Secondary | ICD-10-CM | POA: Diagnosis not present

## 2014-09-17 DIAGNOSIS — I10 Essential (primary) hypertension: Secondary | ICD-10-CM

## 2014-09-17 MED ORDER — LOSARTAN POTASSIUM 100 MG PO TABS
ORAL_TABLET | ORAL | Status: DC
Start: 2014-09-17 — End: 2015-08-17

## 2014-09-17 NOTE — Progress Notes (Signed)
Patient ID: Tonya Wise, female   DOB: 1941-05-01, 74 y.o.   MRN: [id number]    Primary MD: Jill Alexanders  HPI: [name] is a 74 year old female presents for one-year cardiology evaluation.  [name] was diagnosed as having non-Hodgkin's lymphoma in 2014. She received 2 treatments of chemotherapy. On 06/16/2013 she underwent a followup CT scan which showed slight interval reduction in axillary lymphadenopathy but there was a 1 small residual right axillary lymph node measuring 16 a 12 mm. She also is noted a significant reduction in mediastinal and hilar adenopathy. However, it was noted that she had a left-sided pulmonary embolism. There also was resolution of bulky abdominal lymphadenopathy as well as interval decrease in the size of her spleen since initiating chemotherapy. Because of the diagnosis of left-sided pulmonary embolism she apparently started therapy with combination Lovenox as well as oral Coumadin. She apparently was seen in Dr. Lanice Shirts office on 06/17/2013 with complaints of increasing cough that has become productive. She was started on Zithromax for possible acute bronchitis. She was tachycardic and did have T-wave changes.   A 2-D echo Doppler study in September 2014 while she was hospitalized at [facility] demonstrated an ejection fraction was 50-55%. She had pulmonary hypertension with estimated RV systolic pressure at 39 mm and Grade 1 diastolic dysfunction.  When I initially saw her in November 2014, I  recommend that she undergo a nuclear perfusion study because of some  chest discomfort. Her nuclear perfusion study demonstrated normal perfusion, however ejection fraction was interpreted at 42%. Subsequently, a followup echo Doppler study was recommended and also discuss this with her oncologist, Dr. Juliann Mule. Her followup echo Doppler study on 07/16/2013 confirmed LV dysfunction and ejection fraction was now 35-40%. There was diffuse hypokinesis. She now had  grade 2 diastolic dysfunction. Due to her reduction of LV function, subsequent chemotherapyhas been at a reduced dose. She completed cycle 5 of mini-R-CEO. She does have thrombocytopenia and when last seen by Dr. Juliann Mule her platelets were 59,000. She is scheduled to undergo an additional week of recovery prior to undergoing a next course of chemotherapy.  Since I last saw her in arch 2015, she states that she has done fairly well.  She's not had any chemotherapy in approximately 11 months.  Recently, she has had issues with her blood pressure being elevated.  She states her platelet count has improved and most recently this has been 122,000.  She denies any fevers, chills or night sweats.  She denies any recurrent episodes of fast heartbeats.  Recently she has been maintained on metoprolol, tartrate 37.5 mg twice a day as well as losartan 25 mg twice a day.  Also has GERD for which he takes omeprazole.  She presents for one-year evaluation.  Past Medical History  Diagnosis Date  . Hypertension   . Renal insufficiency   . Osteopenia   . Allergy   . Heart murmur   . GERD (gastroesophageal reflux disease)   . Anemia   . Hypercalcemia 03/27/2013  . Diastolic dysfunction, grade I 04/12/2013  . Non-Hodgkins lymphoma 04/06/2013    Past Surgical History  Procedure Laterality Date  . Appendectomy    . Colonoscopy  2008  . Spine surgery    . Back surgery      LOWER BACK TWICE  . Abdominal hysterectomy    . Esophagogastroduodenoscopy N/A 03/29/2013    Procedure: ESOPHAGOGASTRODUODENOSCOPY (EGD);  Surgeon: Juanita Craver, MD;  Location: [facility] ENDOSCOPY;  Service: Endoscopy;  Laterality:  N/A;  . Inguinal lymph node biopsy Right 03/31/2013    Procedure: INGUINAL LYMPH NODE BIOPSY;  Surgeon: Gwenyth Ober, MD;  Location: [facility];  Service: General;  Laterality: Right;    Allergies  Allergen Reactions  . Prednisone Other (See Comments)    "like being in a coma"  . Penicillins Itching, Rash and Other (See  Comments)    "burning sensation"    Current Outpatient Prescriptions  Medication Sig Dispense Refill  . HYDROcodone-homatropine (HYCODAN) 5-1.5 MG/5ML syrup Take 5 mLs by mouth every 6 (six) hours as needed for cough. 120 mL 0  . lidocaine-prilocaine (EMLA) cream Apply 1 application topically as needed. Apply to port site one hour before treatment and cover with plastic wrap. 30 g 3  . metoprolol tartrate (LOPRESSOR) 25 MG tablet Take 1.5 tablets (37.5 mg total) by mouth 2 (two) times daily. Take 1 1/2 tab twice daily 270 tablet 3  . omeprazole (PRILOSEC) 40 MG capsule Take 1 capsule (40 mg total) by mouth daily. 90 capsule 3  . losartan (COZAAR) 100 MG tablet Take 1/2 tablet twice a day 90 tablet 3   No current facility-administered medications for this visit.    Social history is notable in that she is married for 54 years. 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 She has 2 children and 2 grandchildren. No tobacco use presently. She quit remotely in 1962. There is no alcohol use. She does not routinely exercise.  Family History  Problem Relation Age of Onset  . Heart attack Father 16  . Leukemia Mother   . Diabetes Brother   . Diabetes Sister    ROS General: Negative; No fevers, chills, or night sweats;  HEENT: Negative; No changes in vision or hearing, sinus congestion, difficulty swallowing Pulmonary: Negative; No cough, wheezing, shortness of breath, hemoptysis Cardiovascular: Negative; No chest pain, presyncope, syncope, palpitations GI: Negative; No nausea, vomiting, diarrhea, or abdominal pain GU: Negative; No dysuria, hematuria, or difficulty voiding Musculoskeletal: Negative; no myalgias, joint pain, or weakness Hematologic/Oncology:  Positive for non-Hodgkin's lymphoma, currently in remission Endocrine: Negative; no heat/cold intolerance; no diabetes Neuro: Negative; no changes in balance, headaches Skin: Negative; No rashes or skin lesions Psychiatric:  Negative; No behavioral problems, depression Sleep: Negative; No snoring, daytime sleepiness, hypersomnolence, bruxism, restless legs, hypnogognic hallucinations, no cataplexy Other comprehensive 14 point system review is negative.   PE BP 174/86 mmHg  Pulse 53  Ht '5\' 2"'  (1.575 m)  Wt 131 lb 8 oz (59.648 kg)  BMI 24.05 kg/m2 General: Alert, oriented, no distress.  Skin: normal turgor, no rashes HEENT: Normocephalic, atraumatic. Pupils round and reactive; sclera anicteric; Fundi without hemorrhages or exudates. No papilledema Nose without nasal septal hypertrophy 2 Mouth/Parynx benign; Mallinpatti scale 2 Neck: No JVD, no carotid bruits; normal carotid upstroke Chest wall: No tenderness to palpation Lungs: clear to ausculatation and percussion; no wheezing or rales Heart: RRR, s1 s2 normal 8-2/9 systolic murmur along the left sternal border; no diastolic murmur. No S3 or S4 gallop.  No rubs thrills or heaves Abdomen: soft, nontender; no hepatosplenomehaly, BS+; abdominal aorta nontender and not dilated by palpation. Back: No CVA tenderness Pulses 2+ Extremities: no clubbinbg cyanosis or edema, Homan's sign negative  Neurologic: grossly nonfocal Psychologic: Normal mood and affect  ECG (independently read by me): Sinus bradycardia 53 bpm.  LVH with repolarization changes inferolaterally  March 2015 ECG: Normal sinus rhythm at 80 beats per minute with nonspecific T changes diffusely  ECG: I did  review the EKG taken 06/17/2013 at Dr. Royetta Car office. That EKG showed sinus tachycardia 117 beats per minute with inferolateral and anterolateral T wave abnormalities. EKG at  office visit in taken in our November shows sinus rhythm 87 beats per minute. She does have slightly more pronounced T wave inversion in leads II, III, and F the 3 through V6.  LABS:  BMET  BMP Latest Ref Rng 08/26/2014 06/08/2014 04/16/2014  Glucose 70 - 140 mg/dl 138 97 79  BUN 7.0 - 26.0 mg/dL 16.6 18 23.9    Creatinine 0.6 - 1.1 mg/dL 1.2(H) 1.13(H) 1.3(H)  Sodium 136 - 145 mEq/L 145 143 142  Potassium 3.5 - 5.1 mEq/L 4.6 3.7 4.3  Chloride 96 - 112 mEq/L - 103 -  CO2 22 - 29 mEq/L '26 26 27  ' Calcium 8.4 - 10.4 mg/dL 9.0 9.4 9.1     Hepatic Function Panel   Hepatic Function Latest Ref Rng 08/26/2014 04/16/2014 02/05/2014  Total Protein 6.4 - 8.3 g/dL 5.5(L) 5.9(L) 5.6(L)  Albumin 3.5 - 5.0 g/dL 3.7 3.7 3.7  AST 5 - 34 U/L '14 17 19  ' ALT 0 - 55 U/L '12 12 21  ' Alk Phosphatase 40 - 150 U/L 78 71 81  Total Bilirubin 0.20 - 1.20 mg/dL 0.62 0.59 0.51     CBC  CBC Latest Ref Rng 08/26/2014 07/02/2014 06/08/2014  WBC 3.9 - 10.3 10e3/uL 4.3 4.3 4.9  Hemoglobin 11.6 - 15.9 g/dL 12.4 12.3 12.9  Hematocrit 34.8 - 46.6 % 38.6 36.8 38.5  Platelets 145 - 400 10e3/uL 122(L) 95(L) 91(L)     BNP    Component Value Date/Time   PROBNP 21795.0* 08/21/2013 1905    Lipid Panel   Lipid Panel     Component Value Date/Time   CHOL 234* 07/17/2013 0834   TRIG 319* 07/17/2013 0834   HDL 39* 07/17/2013 0834   CHOLHDL 6.0 07/17/2013 0834   VLDL 64* 07/17/2013 0834   LDLCALC 131* 07/17/2013 0834      RADIOLOGY: Ct Chest W Contrast  06/16/2013   CLINICAL DATA:  Non-Hodgkin's lymphoma.  EXAM: CT CHEST, ABDOMEN, AND PELVIS WITH CONTRAST  TECHNIQUE: Multidetector CT imaging of the chest, abdomen and pelvis was performed following the standard protocol during bolus administration of intravenous contrast.  CONTRAST:  161m OMNIPAQUE IOHEXOL 300 MG/ML  SOLN  COMPARISON:  03/30/2013.  FINDINGS: CT CHEST FINDINGS  The chest wall demonstrates a right-sided Port-A-Cath in good position without complicating features. Bilateral breast prosthesis are noted. The bulky bilateral axillary lymphadenopathy has resolved. There is a small residual right axillary node on image number 12 which measures 16 x 12 mm. It previously measured 27 x 20 mm. It did show slight hypermetabolism on the PET scan. Other normal size axillary  lymph nodes did not show any hyper metabolism.  The bony thorax is intact. No destructive bone lesions or spinal canal compromise.  The heart is normal in size. No pericardial effusion. No mediastinal or hilar mass. The bulky lymphadenopathy seen on the prior study has significantly decreased in size. The subcarinal nodal mass previously measured 4.0 x 2.2 cm and now measures 2.0 x 1.3 cm. The aorta is normal in caliber. Stable atherosclerotic calcifications. There is clot noted in the left lower lobe pulmonary artery consistent with pulmonary embolism. There is also a small amount of string like adherent clot in the main left pulmonary artery.  Examination of the lung parenchyma demonstrates no worrisome pulmonary nodules. Stable area of subpleural scarring type  changes in the right middle lobe. There is a small right pleural effusion with overlying atelectasis.  CT ABDOMEN AND PELVIS FINDINGS  The liver demonstrates diffuse fatty infiltration but no focal hepatic lesions or biliary dilatation. The gallbladder is normal. No common bowel duct dilatation. The pancreas is normal. The spleen remains mildly enlarged and there are multiple low-attenuation lesions. The adrenal glands and kidneys are unremarkable and unchanged.  The stomach, duodenum, small bowel and colon are unremarkable. No inflammatory changes, mass lesions or obstructive findings. There are small scattered mesenteric and retroperitoneal lymph nodes. This reflects a significant improvement since the prior CT scan where there was bulky adenopathy. The aorta and branch vessels are patent. Stable atherosclerotic calcifications.  The uterus and ovaries are unremarkable and stable. The bladder is normal. No pelvic mass, adenopathy or free pelvic fluid collections. No inguinal mass or adenopathy. The borderline inguinal lymph nodes have resolved.  No significant bony findings.  Stable lumbar fusion changes.  IMPRESSION: 1. Significant interval reduction in  axillary lymphadenopathy. There is 1 small residual right axillary lymph node measuring 16 x 12 mm which did show FDG uptake. 2. Significant reduction in mediastinal and hilar lymphadenopathy. 3. Left-sided pulmonary embolism. 4. Resolution of bulky abdominal lymphadenopathy. 5. Interval decrease in size of the spleen (15 x 14 x 10.5 versus 12 x 12 x 9.5). Persistent small lesions.   Electronically Signed   By: Kalman Jewels M.D.   On: 06/16/2013 15:11   Ct Abdomen Pelvis W Contrast  06/16/2013   CLINICAL DATA:  Non-Hodgkin's lymphoma.  EXAM: CT CHEST, ABDOMEN, AND PELVIS WITH CONTRAST  TECHNIQUE: Multidetector CT imaging of the chest, abdomen and pelvis was performed following the standard protocol during bolus administration of intravenous contrast.  CONTRAST:  19m OMNIPAQUE IOHEXOL 300 MG/ML  SOLN  COMPARISON:  03/30/2013.  FINDINGS: CT CHEST FINDINGS  The chest wall demonstrates a right-sided Port-A-Cath in good position without complicating features. Bilateral breast prosthesis are noted. The bulky bilateral axillary lymphadenopathy has resolved. There is a small residual right axillary node on image number 12 which measures 16 x 12 mm. It previously measured 27 x 20 mm. It did show slight hypermetabolism on the PET scan. Other normal size axillary lymph nodes did not show any hyper metabolism.  The bony thorax is intact. No destructive bone lesions or spinal canal compromise.  The heart is normal in size. No pericardial effusion. No mediastinal or hilar mass. The bulky lymphadenopathy seen on the prior study has significantly decreased in size. The subcarinal nodal mass previously measured 4.0 x 2.2 cm and now measures 2.0 x 1.3 cm. The aorta is normal in caliber. Stable atherosclerotic calcifications. There is clot noted in the left lower lobe pulmonary artery consistent with pulmonary embolism. There is also a small amount of string like adherent clot in the main left pulmonary artery.  Examination  of the lung parenchyma demonstrates no worrisome pulmonary nodules. Stable area of subpleural scarring type changes in the right middle lobe. There is a small right pleural effusion with overlying atelectasis.  CT ABDOMEN AND PELVIS FINDINGS  The liver demonstrates diffuse fatty infiltration but no focal hepatic lesions or biliary dilatation. The gallbladder is normal. No common bowel duct dilatation. The pancreas is normal. The spleen remains mildly enlarged and there are multiple low-attenuation lesions. The adrenal glands and kidneys are unremarkable and unchanged.  The stomach, duodenum, small bowel and colon are unremarkable. No inflammatory changes, mass lesions or obstructive findings. There are small scattered  mesenteric and retroperitoneal lymph nodes. This reflects a significant improvement since the prior CT scan where there was bulky adenopathy. The aorta and branch vessels are patent. Stable atherosclerotic calcifications.  The uterus and ovaries are unremarkable and stable. The bladder is normal. No pelvic mass, adenopathy or free pelvic fluid collections. No inguinal mass or adenopathy. The borderline inguinal lymph nodes have resolved.  No significant bony findings.  Stable lumbar fusion changes.  IMPRESSION: 1. Significant interval reduction in axillary lymphadenopathy. There is 1 small residual right axillary lymph node measuring 16 x 12 mm which did show FDG uptake. 2. Significant reduction in mediastinal and hilar lymphadenopathy. 3. Left-sided pulmonary embolism. 4. Resolution of bulky abdominal lymphadenopathy. 5. Interval decrease in size of the spleen (15 x 14 x 10.5 versus 12 x 12 x 9.5). Persistent small lesions.   Electronically Signed   By: Kalman Jewels M.D.   On: 06/16/2013 15:11   Nm Pet Image Restag (ps) Skull Base To Thigh  06/16/2013   CLINICAL DATA:  Subsequent treatment strategy for non-Hodgkin's lymphoma.  EXAM: NUCLEAR MEDICINE PET SKULL BASE TO THIGH  FASTING BLOOD  GLUCOSE:  Value: 175m/dl  TECHNIQUE: 19.0 mCi F-18 FDG was injected intravenously. CT data was obtained and used for attenuation correction and anatomic localization only. (This was not acquired as a diagnostic CT examination.) Additional exam technical data entered on technologist worksheet.  COMPARISON:  CT scan 03/30/2013.  FINDINGS: NECK  No hypermetabolic lymph nodes in the neck.  CHEST  There is a single right axillary lymph node measuring 16 x 10 mm in demonstrates FDG uptake with SUV max of 5.5. This would suggest some residual metabolically active lymphoma. The mediastinal and hilar lymph nodes are much smaller than a previous chest CT and I do not see any definite metabolic activity. No worrisome pulmonary lesions. The right middle lobe subpleural density is relatively stable and is likely scarring or atelectasis. No abnormal FDG uptake is identified. There is a small right pleural effusion.  ABDOMEN/PELVIS  No persistence/residual lymphadenopathy in the abdomen. A few small scattered lymph nodes are noted but the bulky adenopathy has resolved. No metabolic activity is identified in the remaining small lymph nodes. Stable mild splenomegaly with numerous small lesions but no hyper metabolism.  SKELETON  Diffuse osseous uptake is likely due to rebound marrow activity or marrow stimulating drugs.  IMPRESSION: 1. Single metabolically active 16 x 10 mm in the right axilla. 2. Persistent but definitely smaller mediastinal and hilar lymph node without definite metabolic activity. 3. Resolution of abdominal adenopathy with some scattered residual lymph nodes but no metabolic activity. 4. Persistent mild splenomegaly and small splenic lesions without obvious hyper metabolism.   Electronically Signed   By: MKalman JewelsM.D.   On: 06/16/2013 14:38     ASSESSMENT AND PLAN: Ms. DBoylandis a 74year old female with a history of non-Hodgkin's lymphoma, stage lvB and has completed chemotherapy.  initially has  been demonstrated to have some improvement in her prior more significant adenopathy.  Initially she had normal LV function but this decreased following chemotherapy treatment.  Her last echo Doppler study last year showed an EF of 35-40% with diffuse hypokinesis and grade 2 diastolic dysfunction, which led to adjustment of her chemotherapy.  Presently, she denies any shortness of breath.  She denies any palpitations.  Her blood pressure today is elevated with stage II hypertension.  I have recommended further titration of her losartan to 100 mg daily, which can be either once  a day or 50 g twice a day.  Her ECG shows sinus bradycardia on her current dose of metoprolol, tartrate 37.5 mg twice a day and I will continue her on this present regimen.  I did review recent blood work from 08/26/2014 which showed a creatinine of 1.2.  Her platelet count was now 122,000 increased from 95,000.  Now that she has not had chemotherapy in almost a year after she has been on her increased dose of losartan for ~4 weeks I will schedule her to undergo a follow-up echo Doppler study to reassess systolic and diastolic function.  I will also recheck lipid studies since she did have hyperlipidemia noted are checked in 2014.  I will contact her regarding the results of laboratory.  I will see her in the office in follow-up in approximately 3 months for further evaluation.   Troy Sine, MD, [facility] 09/17/2014 8:26 AM

## 2014-09-17 NOTE — Patient Instructions (Addendum)
Your physician has requested that you have an echocardiogram. Echocardiography is a painless test that uses sound waves to create images of your heart. It provides your doctor with information about the size and shape of your heart and how well your heart's chambers and valves are working. This procedure takes approximately one hour. There are no restrictions for this procedure. This will be done in approximately 1 month after being on the increased dose of the losartan.   Your physician has recommended you make the following change in your medication: increase the losartan to 50 mg twice daily.  Your physician recommends that you return for lab work in: 2-3 weeks after losartan increase.  Your physician recommends that you schedule a follow-up appointment in: 3 months with Dr. Claiborne Billings.

## 2014-09-21 ENCOUNTER — Ambulatory Visit (HOSPITAL_COMMUNITY)
Admission: RE | Admit: 2014-09-21 | Discharge: 2014-09-21 | Disposition: A | Discharge status: Home / Self Care | Payer: Medicare Other | Source: Ambulatory Visit | Attending: Cardiovascular Disease | Admitting: Cardiovascular Disease

## 2014-09-21 DIAGNOSIS — I10 Essential (primary) hypertension: Secondary | ICD-10-CM | POA: Diagnosis not present

## 2014-09-21 NOTE — Progress Notes (Signed)
2D Echo Performed 09/21/2014    Marygrace Drought, RCS

## 2014-10-11 DIAGNOSIS — E789 Disorder of lipoprotein metabolism, unspecified: Secondary | ICD-10-CM | POA: Diagnosis not present

## 2014-10-11 DIAGNOSIS — I10 Essential (primary) hypertension: Secondary | ICD-10-CM | POA: Diagnosis not present

## 2014-10-11 DIAGNOSIS — E7889 Other lipoprotein metabolism disorders: Secondary | ICD-10-CM | POA: Diagnosis not present

## 2014-10-12 ENCOUNTER — Telehealth: Payer: Self-pay | Admitting: *Deleted

## 2014-10-12 LAB — LIPID PANEL
CHOL/HDL RATIO: 2.9 ratio
Cholesterol: 149 mg/dL (ref 0–200)
HDL: 51 mg/dL (ref >=46)
LDL CALC: 72 mg/dL (ref 0–99)
Triglycerides: 132 mg/dL (ref <150)
VLDL: 26 mg/dL (ref 0–40)

## 2014-10-12 LAB — BASIC METABOLIC PANEL
BUN: 15 mg/dL (ref 6–23)
CHLORIDE: 110 meq/L (ref 96–112)
CO2: 29 mEq/L (ref 19–32)
Calcium: 8.8 mg/dL (ref 8.4–10.5)
Creat: 0.99 mg/dL (ref 0.50–1.10)
GLUCOSE: 89 mg/dL (ref 70–99)
Potassium: 4.4 mEq/L (ref 3.5–5.3)
Sodium: 146 mEq/L — ABNORMAL HIGH (ref 135–145)

## 2014-10-12 NOTE — Telephone Encounter (Signed)
-----   Message from Troy Sine, MD sent at 10/12/2014  8:27 AM EDT ----- Labs good

## 2014-10-12 NOTE — Telephone Encounter (Signed)
Left message - result have been release to [facility] CHART

## 2014-11-23 ENCOUNTER — Other Ambulatory Visit (HOSPITAL_BASED_OUTPATIENT_CLINIC_OR_DEPARTMENT_OTHER): Payer: Medicare Other

## 2014-11-23 ENCOUNTER — Other Ambulatory Visit: Payer: Medicare Other

## 2014-11-23 ENCOUNTER — Encounter (HOSPITAL_COMMUNITY): Payer: Self-pay

## 2014-11-23 ENCOUNTER — Ambulatory Visit (HOSPITAL_COMMUNITY)
Admission: RE | Admit: 2014-11-23 | Discharge: 2014-11-23 | Disposition: A | Discharge status: Home / Self Care | Payer: Medicare Other | Source: Ambulatory Visit | Attending: Internal Medicine | Admitting: Internal Medicine

## 2014-11-23 DIAGNOSIS — Z9221 Personal history of antineoplastic chemotherapy: Secondary | ICD-10-CM | POA: Diagnosis not present

## 2014-11-23 DIAGNOSIS — C8590 Non-Hodgkin lymphoma, unspecified, unspecified site: Secondary | ICD-10-CM | POA: Insufficient documentation

## 2014-11-23 LAB — COMPREHENSIVE METABOLIC PANEL (CC13)
ALBUMIN: 4 g/dL (ref 3.5–5.0)
ALT: 16 U/L (ref 0–55)
AST: 19 U/L (ref 5–34)
Alkaline Phosphatase: 84 U/L (ref 40–150)
Anion Gap: 9 mEq/L (ref 3–11)
BUN: 18.6 mg/dL (ref 7.0–26.0)
CO2: 24 mEq/L (ref 22–29)
Calcium: 9.4 mg/dL (ref 8.4–10.4)
Chloride: 108 mEq/L (ref 98–109)
Creatinine: 1.2 mg/dL — ABNORMAL HIGH (ref 0.6–1.1)
EGFR: 43 mL/min/{1.73_m2} — AB (ref >90)
GLUCOSE: 103 mg/dL (ref 70–140)
POTASSIUM: 4.7 meq/L (ref 3.5–5.1)
Sodium: 141 mEq/L (ref 136–145)
Total Bilirubin: 0.85 mg/dL (ref 0.20–1.20)
Total Protein: 6.1 g/dL — ABNORMAL LOW (ref 6.4–8.3)

## 2014-11-23 LAB — CBC WITH DIFFERENTIAL/PLATELET
BASO%: 0.2 % (ref 0.0–2.0)
BASOS ABS: 0 10*3/uL (ref 0.0–0.1)
EOS ABS: 0.1 10*3/uL (ref 0.0–0.5)
EOS%: 0.9 % (ref 0.0–7.0)
HEMATOCRIT: 44.4 % (ref 34.8–46.6)
HGB: 14.9 g/dL (ref 11.6–15.9)
LYMPH%: 34 % (ref 14.0–49.7)
MCH: 29.4 pg (ref 25.1–34.0)
MCHC: 33.6 g/dL (ref 31.5–36.0)
MCV: 87.7 fL (ref 79.5–101.0)
MONO#: 0.6 10*3/uL (ref 0.1–0.9)
MONO%: 10.4 % (ref 0.0–14.0)
NEUT%: 54.5 % (ref 38.4–76.8)
NEUTROS ABS: 3 10*3/uL (ref 1.5–6.5)
PLATELETS: 113 10*3/uL — AB (ref 145–400)
RBC: 5.06 10*6/uL (ref 3.70–5.45)
RDW: 14.1 % (ref 11.2–14.5)
WBC: 5.5 10*3/uL (ref 3.9–10.3)
lymph#: 1.9 10*3/uL (ref 0.9–3.3)

## 2014-11-23 LAB — LACTATE DEHYDROGENASE (CC13): LDH: 217 U/L (ref 125–245)

## 2014-11-23 MED ORDER — IOHEXOL 300 MG/ML  SOLN
50.0000 mL | Freq: Once | INTRAMUSCULAR | Status: AC | PRN
  Administered 2014-11-23: 50 mL via ORAL

## 2014-11-23 MED ORDER — IOHEXOL 300 MG/ML  SOLN
100.0000 mL | Freq: Once | INTRAMUSCULAR | Status: AC | PRN
  Administered 2014-11-23: 80 mL via INTRAVENOUS

## 2014-11-25 ENCOUNTER — Encounter: Payer: Self-pay | Admitting: Internal Medicine

## 2014-11-25 ENCOUNTER — Telehealth: Payer: Self-pay | Admitting: Internal Medicine

## 2014-11-25 ENCOUNTER — Ambulatory Visit (HOSPITAL_BASED_OUTPATIENT_CLINIC_OR_DEPARTMENT_OTHER): Payer: Medicare Other | Admitting: Internal Medicine

## 2014-11-25 VITALS — BP 176/82 | HR 60 | Temp 98.0°F | Resp 18 | Ht 62.0 in | Wt 136.7 lb

## 2014-11-25 DIAGNOSIS — C8335 Diffuse large B-cell lymphoma, lymph nodes of inguinal region and lower limb: Secondary | ICD-10-CM | POA: Diagnosis not present

## 2014-11-25 DIAGNOSIS — C8590 Non-Hodgkin lymphoma, unspecified, unspecified site: Secondary | ICD-10-CM

## 2014-11-25 NOTE — Progress Notes (Signed)
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  PRIOR THERAPY:   Non-Hodgkins lymphoma   03/27/2013 - 03/31/2013 Hospital Admission Admitted to [facility] for profound anemia (Hgb 6.4).  C/o fevers, drenching nightsweats, weight lost.  Negative colonoscopy and EGD.  CT of C/A/P demonstrated significant lymphadenopathy suspcious of ympha.     03/31/2013 Initial Diagnosis Biopsy of R inguinal lymph node revealed Non-Hodgkins lymphoma. Diffuse Large B-cell Lymphoma   04/10/2013 - 04/24/2013 Hospital Admission Hypercalcemia, renal failure and dehydration.  Started min-RCHOP.    04/15/2013 Bone Marrow Biopsy Involvement with Diffuse Large B-cell Lymphoma. Stage IV NHL w B-symptoms.    04/18/2013 - 04/18/2013 Chemotherapy Mini R-CHOP Cycle #1 on 04/18/2013. Dose reduced to poor nutrion. It consisted on Cytoxan 400 mg/m2,Doxorubicin 25 mg/m2, Rituximab 375 mg/m2, Vincristine 2m.  Prednisone 100 mg daily for 9/13 - 9/16.   Received neupogen on 9/21, 9/27 -9/30.     04/25/2013 - 04/29/2013 Hospital Admission Admitted due to worsening mouth sores/oral thrush, odynophagia, failure to thrive.  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  [facility]Dr[facility] CGwynneth Munson and discharged to home on 05/19/2013.     06/01/2013 - 06/01/2013 Chemotherapy Mini R-CHO #2 dosed as above.  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  Received neulasta shot on 06/02/13   06/16/2013 Cancer Staging Re-staging PET/CT demonstrated significant interval reduction in axillary lymphadenopathy.  There was 1 small residual right axillary lymph node measuring 16 x 12 mm which showed FDG uptake.  Signifcant reduction in mediastinal and hilar lymphadenopathy.    06/16/2013 Imaging CT of chest noted left-sided pulmonary embolim. Started on lovenox to coumadin bridge.     06/22/2013 - 06/22/2013 Chemotherapy Mini-R-CHO #3.  Received Neulasta on 06/23/2013.     07/16/2013 Imaging 2-D Echocardiogram demonstrates 35-45% EF with Grade 2 diastolic dysfunction.  Doxorubicin discontinued with consultation by cardiology (Dr. KClaiborne Billings.     08/19/2013 - 08/21/2013 Chemotherapy Mini-R-CEO #4.  Etoposide 25 mg/m2 instead of doxo due to above.  Etoposide 580mbid on day 2/3.  On day 3 (08/21/2013) she received 1 of two oral doses of ectoposide.    08/21/2013 - 08/26/2013 Hospital Admission [facility] to acute respiratory failure and had influenzae A and pneumonia. Completed tamiflu and oral antibiotics.    09/25/2013 - 09/27/2013 Chemotherapy Mini-R-CEO #5. Etoposide 25 mg/m2 25 mg bid on days 2/3.    10/16/2013 - 10/19/2013 Chemotherapy Mini-R-CEO # 6. Etoposide 25 mg/m2 bid on days 2/3. Neulasta on day#4.    11/05/2013 Imaging Interval resolution of the hypermetabolism identified within the enlarged right axillary lymph node seen previously. This lymph node has also decreased markedly in size in the interval. No new hypermetabolic lesions in the neck, chest, abdomen, or  pelvis.    11/19/2013 Bone Marrow Biopsy There is no evidence of a B-cell lymphoproliferative process in this material. Normal cytogenetics.    04/16/2014 Imaging CT scan Chest, abdomen: No evidence of recurrent lymphoma within the chest or abdomen.   Significant decrease in splenomegaly since prior exam.     CURRENT THERAPY: Observation.  INTERVAL HISTORY: Tonya NEYRA74.o. female returns to the clinic today for follow-up visit accompanied by her husband. The patient is feeling fine today with no specific complaints. She is currently  on observation. She denied having any significant chest pain, shortness breath, cough or hemoptysis. The patient denied having any nausea or vomiting. She denied  having any significant weight loss or night sweats. She had repeat CT scan of the chest, abdomen and pelvis performed recently and she is here for evaluation and discussion of her scan results.  MEDICAL HISTORY: Past Medical History  Diagnosis Date  . Hypertension   . Renal insufficiency   . Osteopenia   . Allergy   . Heart murmur   . GERD (gastroesophageal reflux disease)   . Anemia   . Hypercalcemia 03/27/2013  . Diastolic dysfunction, grade I 04/12/2013  . Non-Hodgkins lymphoma 04/06/2013    ALLERGIES:  is allergic to prednisone and penicillins.  MEDICATIONS:  Current Outpatient Prescriptions  Medication Sig Dispense Refill  . HYDROcodone-homatropine (HYCODAN) 5-1.5 MG/5ML syrup Take 5 mLs by mouth every 6 (six) hours as needed for cough. 120 mL 0  . lidocaine-prilocaine (EMLA) cream Apply 1 application topically as needed. Apply to port site one hour before treatment and cover with plastic wrap. 30 g 3  . losartan (COZAAR) 100 MG tablet Take 1/2 tablet twice a day 90 tablet 3  . metoprolol tartrate (LOPRESSOR) 25 MG tablet Take 1.5 tablets (37.5 mg total) by mouth 2 (two) times daily. Take 1 1/2 tab twice daily 270 tablet 3  . omeprazole (PRILOSEC) 40 MG capsule Take 1 capsule (40 mg total) by mouth daily. 90 capsule 3   No current facility-administered medications for this visit.    SURGICAL HISTORY:  Past Surgical History  Procedure Laterality Date  . Appendectomy    . Colonoscopy  2008  . Spine surgery    . Back surgery      LOWER BACK TWICE  . Abdominal hysterectomy    . Esophagogastroduodenoscopy N/A 03/29/2013    Procedure: ESOPHAGOGASTRODUODENOSCOPY (EGD);  Surgeon: Juanita Craver, MD;  Location: [facility] ENDOSCOPY;  Service: Endoscopy;  Laterality: N/A;  . Inguinal lymph node biopsy Right 03/31/2013    Procedure: INGUINAL LYMPH NODE BIOPSY;  Surgeon: Gwenyth Ober, MD;  Location: [facility];  Service: General;  Laterality: Right;    REVIEW OF SYSTEMS:  A comprehensive review of  systems was negative.   PHYSICAL EXAMINATION: General appearance: alert, cooperative and no distress Head: Normocephalic, without obvious abnormality, atraumatic Neck: no adenopathy, no JVD, supple, symmetrical, trachea midline and thyroid not enlarged, symmetric, no tenderness/mass/nodules Lymph nodes: Cervical, supraclavicular, and axillary nodes normal. Resp: clear to auscultation bilaterally Back: symmetric, no curvature. ROM normal. No CVA tenderness. Cardio: regular rate and rhythm, S1, S2 normal, no murmur, click, rub or gallop GI: soft, non-tender; bowel sounds normal; no masses,  no organomegaly Extremities: extremities normal, atraumatic, no cyanosis or edema Neurologic: Alert and oriented X 3, normal strength and tone. Normal symmetric reflexes. Normal coordination and gait  ECOG PERFORMANCE STATUS: 1 - Symptomatic but completely ambulatory  Blood pressure 176/82, pulse 60, temperature 98 F (36.7 C), temperature source Oral, resp. rate 18, height '5\' 2"'  (1.575 m), weight 136 lb 11.2 oz (62.007 kg), SpO2 100 %.  LABORATORY DATA: Lab Results  Component Value Date   WBC 5.5 11/23/2014   HGB 14.9 11/23/2014   HCT 44.4 11/23/2014   MCV 87.7 11/23/2014   PLT 113* 11/23/2014      Chemistry      Component Value Date/Time   NA 141 11/23/2014 0825   NA 146* 10/11/2014 0914   K 4.7 11/23/2014 0825   K 4.4 10/11/2014 0914  CL 110 10/11/2014 0914   CO2 24 11/23/2014 0825   CO2 29 10/11/2014 0914   BUN 18.6 11/23/2014 0825   BUN 15 10/11/2014 0914   CREATININE 1.2* 11/23/2014 0825   CREATININE 0.99 10/11/2014 0914   CREATININE 1.13* 06/08/2014 0800      Component Value Date/Time   CALCIUM 9.4 11/23/2014 0825   CALCIUM 8.8 10/11/2014 0914   ALKPHOS 84 11/23/2014 0825   ALKPHOS 59 08/21/2013 1535   AST 19 11/23/2014 0825   AST 20 08/21/2013 1535   ALT 16 11/23/2014 0825   ALT 8 08/21/2013 1535   BILITOT 0.85 11/23/2014 0825   BILITOT 0.6 08/21/2013 1535        RADIOGRAPHIC STUDIES: Ct Chest W Contrast  11/23/2014   CLINICAL DATA:  Subsequent treatment strategy for non-Hodgkin's lymphoma. Diagnosis August 2014 with chemotherapy completed March 2015  EXAM: CT CHEST, ABDOMEN, AND PELVIS WITH CONTRAST  TECHNIQUE: Multidetector CT imaging of the chest, abdomen and pelvis was performed following the standard protocol during bolus administration of intravenous contrast.  CONTRAST:  36m OMNIPAQUE IOHEXOL 300 MG/ML  SOLN  COMPARISON:  CT 04/16/2014, PET-CT 10/30/2013  FINDINGS: CT CHEST FINDINGS  Mediastinum/Nodes: No axillary supraclavicular lymphadenopathy. No mediastinal hilar lymphadenopathy. No pericardial fluid. Esophagus normal. Bilateral breast implants noted.  Lungs/Pleura: No suspicious pulmonary nodules.  CT ABDOMEN AND PELVIS FINDINGS  Hepatobiliary: No focal hepatic lesion. No biliary duct dilatation. Gallbladder is normal. Common bile duct is normal.  Pancreas: Several calcifications in the pancreas are again demonstrated likely relate to remote inflammation. No pancreatic duct dilatation.  Spleen: Spleen is normal volume with a calculated volume of 320 cc.  Adrenals/urinary tract: Adrenal glands and kidneys are normal. The ureters and bladder normal.  Stomach/Bowel: Stomach, small bowel, appendix, and cecum are normal. The colon and rectosigmoid colon are normal.  Vascular/Lymphatic: Abdominal aorta is normal caliber. There is no retroperitoneal or periportal lymphadenopathy. No pelvic lymphadenopathy.  Reproductive: Uterus and ovaries are normal.  Musculoskeletal: Posterior lumbar fusion noted. Grade 1 anterolisthesis of L5 on S1 is not changed.  Other: No free fluid.  IMPRESSION: Chest Impression:  No lymphadenopathy.  Abdomen / Pelvis Impression:  1. No lymphadenopathy. 2. Normal spleen.   Electronically Signed   By: SSuzy BouchardM.D.   On: 11/23/2014 10:55   Ct Abdomen Pelvis W Contrast  11/23/2014   CLINICAL DATA:  Subsequent treatment  strategy for non-Hodgkin's lymphoma. Diagnosis August 2014 with chemotherapy completed March 2015  EXAM: CT CHEST, ABDOMEN, AND PELVIS WITH CONTRAST  TECHNIQUE: Multidetector CT imaging of the chest, abdomen and pelvis was performed following the standard protocol during bolus administration of intravenous contrast.  CONTRAST:  878mOMNIPAQUE IOHEXOL 300 MG/ML  SOLN  COMPARISON:  CT 04/16/2014, PET-CT 10/30/2013  FINDINGS: CT CHEST FINDINGS  Mediastinum/Nodes: No axillary supraclavicular lymphadenopathy. No mediastinal hilar lymphadenopathy. No pericardial fluid. Esophagus normal. Bilateral breast implants noted.  Lungs/Pleura: No suspicious pulmonary nodules.  CT ABDOMEN AND PELVIS FINDINGS  Hepatobiliary: No focal hepatic lesion. No biliary duct dilatation. Gallbladder is normal. Common bile duct is normal.  Pancreas: Several calcifications in the pancreas are again demonstrated likely relate to remote inflammation. No pancreatic duct dilatation.  Spleen: Spleen is normal volume with a calculated volume of 320 cc.  Adrenals/urinary tract: Adrenal glands and kidneys are normal. The ureters and bladder normal.  Stomach/Bowel: Stomach, small bowel, appendix, and cecum are normal. The colon and rectosigmoid colon are normal.  Vascular/Lymphatic: Abdominal aorta is normal caliber. There is no retroperitoneal  or periportal lymphadenopathy. No pelvic lymphadenopathy.  Reproductive: Uterus and ovaries are normal.  Musculoskeletal: Posterior lumbar fusion noted. Grade 1 anterolisthesis of L5 on S1 is not changed.  Other: No free fluid.  IMPRESSION: Chest Impression:  No lymphadenopathy.  Abdomen / Pelvis Impression:  1. No lymphadenopathy. 2. Normal spleen.   Electronically Signed   By: Suzy Bouchard M.D.   On: 11/23/2014 10:55    ASSESSMENT AND PLAN: This is a very pleasant 74 years old white female with history of stage IV large B-cell non-Hodgkin lymphoma status post systemic chemotherapy initially with the CHOP  Rituxan but this was modified secondary to steroid-induced psychosis as well as cardiomyopathy. The patient is currently on observation and the recent CT scan of the chest, abdomen and pelvis showed no evidence for lymphoma recurrence. I discussed the scan results with the patient and her husband. I recommended for her to continue on observation with repeat CBC, comprehensive metabolic panel and LDH in 6 months. She was advised to call immediately if she has any concerning symptoms in the interval. The patient voices understanding of current disease status and treatment options and is in agreement with the current care plan.  All questions were answered. The patient knows to call the clinic with any problems, questions or concerns. We can certainly see the patient much sooner if necessary.  Disclaimer: This note was dictated with voice recognition software. Similar sounding words can inadvertently be transcribed and may not be corrected upon review.

## 2014-11-25 NOTE — Telephone Encounter (Signed)
Pt confirmed labs/ov per 04/28 POF, gave pt AVS and Calendar.... KJ °

## 2014-12-03 ENCOUNTER — Telehealth: Payer: Self-pay | Admitting: Internal Medicine

## 2014-12-03 NOTE — Telephone Encounter (Signed)
pt called to r/s appt due to going out of town....done....pt ok and aware of new d.t

## 2014-12-20 ENCOUNTER — Encounter: Payer: Self-pay | Admitting: Cardiovascular Disease

## 2014-12-20 ENCOUNTER — Ambulatory Visit (INDEPENDENT_AMBULATORY_CARE_PROVIDER_SITE_OTHER): Payer: Medicare Other | Admitting: Cardiovascular Disease

## 2014-12-20 VITALS — BP 140/90 | HR 50 | Ht 63.0 in | Wt 136.5 lb

## 2014-12-20 DIAGNOSIS — D696 Thrombocytopenia, unspecified: Secondary | ICD-10-CM

## 2014-12-20 DIAGNOSIS — I519 Heart disease, unspecified: Secondary | ICD-10-CM

## 2014-12-20 DIAGNOSIS — I272 Pulmonary hypertension, unspecified: Secondary | ICD-10-CM

## 2014-12-20 DIAGNOSIS — I270 Primary pulmonary hypertension: Secondary | ICD-10-CM

## 2014-12-20 DIAGNOSIS — I5189 Other ill-defined heart diseases: Secondary | ICD-10-CM

## 2014-12-20 DIAGNOSIS — I429 Cardiomyopathy, unspecified: Secondary | ICD-10-CM | POA: Diagnosis not present

## 2014-12-20 DIAGNOSIS — I501 Left ventricular failure: Secondary | ICD-10-CM

## 2014-12-20 DIAGNOSIS — C8590 Non-Hodgkin lymphoma, unspecified, unspecified site: Secondary | ICD-10-CM

## 2014-12-20 DIAGNOSIS — I10 Essential (primary) hypertension: Secondary | ICD-10-CM

## 2014-12-20 NOTE — Patient Instructions (Addendum)
Your physician wants you to follow-up in: 6 Months You will receive a reminder letter in the mail two months in advance. If you don't receive a letter, please call our office to schedule the follow-up appointment.  

## 2014-12-20 NOTE — Progress Notes (Signed)
Patient ID: Tonya Wise, female   DOB: 02-15-1941, 74 y.o.   MRN: [id number]    Primary MD: Jill Alexanders  HPI: Tonya Wise is a 74 year old female presents for a 3 month cardiology evaluation.  Tonya Wise was diagnosed as having non-Hodgkin's lymphoma in 2014. She received 2 treatments of chemotherapy. On 06/16/2013 she underwent a followup CT scan which showed slight interval reduction in axillary lymphadenopathy but there was a 1 small residual right axillary lymph node measuring 16 a 12 mm. She also is noted a significant reduction in mediastinal and hilar adenopathy. However, it was noted that she had a left-sided pulmonary embolism. There also was resolution of bulky abdominal lymphadenopathy as well as interval decrease in the size of her spleen since initiating chemotherapy. Because of the diagnosis of left-sided pulmonary embolism she apparently started therapy with combination Lovenox as well as oral Coumadin. She apparently was seen in Dr. Lanice Shirts office on 06/17/2013 with complaints of increasing cough that has become productive. She was started on Zithromax for possible acute bronchitis. She was tachycardic and did have T-wave changes.   A 2-D echo Doppler study in September 2014 while she was hospitalized at [facility] demonstrated an ejection fraction was 50-55%. She had pulmonary hypertension with estimated RV systolic pressure at 39 mm and Grade 1 diastolic dysfunction.  When I initially saw her in November 2014, I  recommend that she undergo a nuclear perfusion study because of some  chest discomfort. Her nuclear perfusion study demonstrated normal perfusion, however ejection fraction was interpreted at 42%. Subsequently, a followup echo Doppler study was recommended and also discuss this with her oncologist, Dr. Juliann Mule. Her followup echo Doppler study on 07/16/2013 confirmed LV dysfunction and ejection fraction was now 35-40%. There was diffuse hypokinesis. She now had  grade 2 diastolic dysfunction. Due to her reduction of LV function, subsequent chemotherapyhas been at a reduced dose. She completed cycle 5 of mini-R-CEO. She does have thrombocytopenia and when last seen by Dr. Juliann Mule her platelets were 59,000. She is scheduled to undergo an additional week of recovery prior to undergoing a next course of chemotherapy.  She completed her last chemotherapy dose in March 2015.  Recently, she has had issues with her blood pressure being elevated.  She states her platelet count has improved and most recently this has been 122,000.  I saw her 3 months ago, her ECG revealed sinus bradycardia on her dose of metoprolol, tartrate at 37.5 twice a day, and I did not further increase this; however, with her previous diastolic dysfunction.  Further titrated her losartan to 100 mg daily.  She has been taking this 50 mg twice a day.  I recommended that she have a follow-up echo Doppler study.  This was done on 09/21/2014.  This revealed an ejection fraction in the range of 35-40% with previously noted diffuse hypokinesis.  She again had a  pseudonormal left ventricular filling pattern consistent with grade 2 diastolic dysfunction.  There was mild mitral regurgitation.  She states when she was recently seen at the cancer center.  Her blood pressure was elevated.  However, she was anxious that they said she was having a follow-up CT scan.  He denies chest pain.  She denies palpitations.  She denies PND, orthopnea.  She presents for reevaluation.  Past Medical History  Diagnosis Date  . Hypertension   . Renal insufficiency   . Osteopenia   . Allergy   . Heart murmur   . GERD (  gastroesophageal reflux disease)   . Anemia   . Hypercalcemia 03/27/2013  . Diastolic dysfunction, grade I 04/12/2013  . Non-Hodgkins lymphoma 04/06/2013    Past Surgical History  Procedure Laterality Date  . Appendectomy    . Colonoscopy  2008  . Spine surgery    . Back surgery      LOWER BACK TWICE    . Abdominal hysterectomy    . Esophagogastroduodenoscopy N/A 03/29/2013    Procedure: ESOPHAGOGASTRODUODENOSCOPY (EGD);  Surgeon: Juanita Craver, MD;  Location: [facility] ENDOSCOPY;  Service: Endoscopy;  Laterality: N/A;  . Inguinal lymph node biopsy Right 03/31/2013    Procedure: INGUINAL LYMPH NODE BIOPSY;  Surgeon: Gwenyth Ober, MD;  Location: [facility];  Service: General;  Laterality: Right;    Allergies  Allergen Reactions  . Prednisone Other (See Comments)    "like being in a coma"  . Penicillins Itching, Rash and Other (See Comments)    "burning sensation"    Current Outpatient Prescriptions  Medication Sig Dispense Refill  . losartan (COZAAR) 100 MG tablet Take 1/2 tablet twice a day 90 tablet 3  . metoprolol tartrate (LOPRESSOR) 25 MG tablet Take 1.5 tablets (37.5 mg total) by mouth 2 (two) times daily. Take 1 1/2 tab twice daily 270 tablet 3   No current facility-administered medications for this visit.    Social history is notable in that she is married for 54 years. 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 She has 2 children and 2 grandchildren. No tobacco use presently. She quit remotely in 1962. There is no alcohol use. She does not routinely exercise.  Family History  Problem Relation Age of Onset  . Heart attack Father 4  . Leukemia Mother   . Diabetes Brother   . Diabetes Sister    ROS General: Negative; No fevers, chills, or night sweats;  HEENT: Negative; No changes in vision or hearing, sinus congestion, difficulty swallowing Pulmonary: Negative; No cough, wheezing, shortness of breath, hemoptysis Cardiovascular: Negative; No chest pain, presyncope, syncope, palpitations GI: Negative; No nausea, vomiting, diarrhea, or abdominal pain GU: Negative; No dysuria, hematuria, or difficulty voiding Musculoskeletal: Negative; no myalgias, joint pain, or weakness Hematologic/Oncology:  Positive for non-Hodgkin's lymphoma, currently in remission Endocrine: Negative;  no heat/cold intolerance; no diabetes Neuro: Negative; no changes in balance, headaches Skin: Negative; No rashes or skin lesions Psychiatric: Negative; No behavioral problems, depression Sleep: Negative; No snoring, daytime sleepiness, hypersomnolence, bruxism, restless legs, hypnogognic hallucinations, no cataplexy Other comprehensive 14 point system review is negative.   PE BP 140/90 mmHg  Pulse 50  Ht _0  (1.6 m)  Wt 136 lb 8 oz (61.916 kg)  BMI 24.19 kg/m2 General: Alert, oriented, no distress.  Skin: normal turgor, no rashes HEENT: Normocephalic, atraumatic. Pupils round and reactive; sclera anicteric; Fundi without hemorrhages or exudates. No papilledema Nose without nasal septal hypertrophy 2 Mouth/Parynx benign; Mallinpatti scale 2 Neck: No JVD, no carotid bruits; normal carotid upstroke Chest wall: No tenderness to palpation Lungs: clear to ausculatation and percussion; no wheezing or rales Heart: RRR, s1 s2 normal 4-0/9 systolic murmur along the left sternal border; no diastolic murmur. No S3 or S4 gallop.  No rubs thrills or heaves Abdomen: soft, nontender; no hepatosplenomehaly, BS+; abdominal aorta nontender and not dilated by palpation. Back: No CVA tenderness Pulses 2+ Extremities: no clubbinbg cyanosis or edema, Homan's sign negative  Neurologic: grossly nonfocal Psychologic: Normal mood and affect  ECG (independently read by me): Sinus bradycardia 50 bpm.  LVH  with repolarization changes in the vicinity noted diffuse T-wave changes inferolaterally.  See interval 446 ms  February 2016 ECG (independently read by me): Sinus bradycardia 53 bpm.  LVH with repolarization changes inferolaterally  March 2015 ECG: Normal sinus rhythm at 80 beats per minute with nonspecific T changes diffusely  ECG: I did review the EKG taken 06/17/2013 at Dr. Royetta Car office. That EKG showed sinus tachycardia 117 beats per minute with inferolateral and anterolateral T wave  abnormalities. EKG at  office visit in taken in our November shows sinus rhythm 87 beats per minute. She does have slightly more pronounced T wave inversion in leads II, III, and F the 3 through V6.  LABS:  BMET  BMP Latest Ref Rng 11/23/2014 10/11/2014 08/26/2014  Glucose 70 - 140 mg/dl 103 89 138  BUN 7.0 - 26.0 mg/dL 18.6 15 16.6  Creatinine 0.6 - 1.1 mg/dL 1.2(H) 0.99 1.2(H)  Sodium 136 - 145 mEq/L 141 146(H) 145  Potassium 3.5 - 5.1 mEq/L 4.7 4.4 4.6  Chloride 96 - 112 mEq/L - 110 -  CO2 22 - 29 mEq/L _0 Calcium 8.4 - 10.4 mg/dL 9.4 8.8 9.0     Hepatic Function Panel   Hepatic Function Latest Ref Rng 11/23/2014 08/26/2014 04/16/2014  Total Protein 6.4 - 8.3 g/dL 6.1(L) 5.5(L) 5.9(L)  Albumin 3.5 - 5.0 g/dL 4.0 3.7 3.7  AST 5 - 34 U/L _1 ALT 0 - 55 U/L _2 Alk Phosphatase 40 - 150 U/L 84 78 71  Total Bilirubin 0.20 - 1.20 mg/dL 0.85 0.62 0.59     CBC  CBC Latest Ref Rng 11/23/2014 08/26/2014 07/02/2014  WBC 3.9 - 10.3 10e3/uL 5.5 4.3 4.3  Hemoglobin 11.6 - 15.9 g/dL 14.9 12.4 12.3  Hematocrit 34.8 - 46.6 % 44.4 38.6 36.8  Platelets 145 - 400 10e3/uL 113(L) 122(L) 95(L)     BNP    Component Value Date/Time   PROBNP 21795.0* 08/21/2013 1905    Lipid Panel   Lipid Panel     Component Value Date/Time   CHOL 149 10/11/2014 0914   TRIG 132 10/11/2014 0914   HDL 51 10/11/2014 0914   CHOLHDL 2.9 10/11/2014 0914   VLDL 26 10/11/2014 0914   LDLCALC 72 10/11/2014 0914      RADIOLOGY: Ct Chest W Contrast  06/16/2013   CLINICAL DATA:  Non-Hodgkin's lymphoma.  EXAM: CT CHEST, ABDOMEN, AND PELVIS WITH CONTRAST  TECHNIQUE: Multidetector CT imaging of the chest, abdomen and pelvis was performed following the standard protocol during bolus administration of intravenous contrast.  CONTRAST:  165m OMNIPAQUE IOHEXOL 300 MG/ML  SOLN  COMPARISON:  03/30/2013.  FINDINGS: CT CHEST FINDINGS  The chest wall demonstrates a right-sided Port-A-Cath in good position  without complicating features. Bilateral breast prosthesis are noted. The bulky bilateral axillary lymphadenopathy has resolved. There is a small residual right axillary node on image number 12 which measures 16 x 12 mm. It previously measured 27 x 20 mm. It did show slight hypermetabolism on the PET scan. Other normal size axillary lymph nodes did not show any hyper metabolism.  The bony thorax is intact. No destructive bone lesions or spinal canal compromise.  The heart is normal in size. No pericardial effusion. No mediastinal or hilar mass. The bulky lymphadenopathy seen on the prior study has significantly decreased in size. The subcarinal nodal mass previously measured 4.0 x 2.2 cm and now measures 2.0 x 1.3 cm. The aorta is normal  in caliber. Stable atherosclerotic calcifications. There is clot noted in the left lower lobe pulmonary artery consistent with pulmonary embolism. There is also a small amount of string like adherent clot in the main left pulmonary artery.  Examination of the lung parenchyma demonstrates no worrisome pulmonary nodules. Stable area of subpleural scarring type changes in the right middle lobe. There is a small right pleural effusion with overlying atelectasis.  CT ABDOMEN AND PELVIS FINDINGS  The liver demonstrates diffuse fatty infiltration but no focal hepatic lesions or biliary dilatation. The gallbladder is normal. No common bowel duct dilatation. The pancreas is normal. The spleen remains mildly enlarged and there are multiple low-attenuation lesions. The adrenal glands and kidneys are unremarkable and unchanged.  The stomach, duodenum, small bowel and colon are unremarkable. No inflammatory changes, mass lesions or obstructive findings. There are small scattered mesenteric and retroperitoneal lymph nodes. This reflects a significant improvement since the prior CT scan where there was bulky adenopathy. The aorta and branch vessels are patent. Stable atherosclerotic  calcifications.  The uterus and ovaries are unremarkable and stable. The bladder is normal. No pelvic mass, adenopathy or free pelvic fluid collections. No inguinal mass or adenopathy. The borderline inguinal lymph nodes have resolved.  No significant bony findings.  Stable lumbar fusion changes.  IMPRESSION: 1. Significant interval reduction in axillary lymphadenopathy. There is 1 small residual right axillary lymph node measuring 16 x 12 mm which did show FDG uptake. 2. Significant reduction in mediastinal and hilar lymphadenopathy. 3. Left-sided pulmonary embolism. 4. Resolution of bulky abdominal lymphadenopathy. 5. Interval decrease in size of the spleen (15 x 14 x 10.5 versus 12 x 12 x 9.5). Persistent small lesions.   Electronically Signed   By: Kalman Jewels M.D.   On: 06/16/2013 15:11   Ct Abdomen Pelvis W Contrast  06/16/2013   CLINICAL DATA:  Non-Hodgkin's lymphoma.  EXAM: CT CHEST, ABDOMEN, AND PELVIS WITH CONTRAST  TECHNIQUE: Multidetector CT imaging of the chest, abdomen and pelvis was performed following the standard protocol during bolus administration of intravenous contrast.  CONTRAST:  146m OMNIPAQUE IOHEXOL 300 MG/ML  SOLN  COMPARISON:  03/30/2013.  FINDINGS: CT CHEST FINDINGS  The chest wall demonstrates a right-sided Port-A-Cath in good position without complicating features. Bilateral breast prosthesis are noted. The bulky bilateral axillary lymphadenopathy has resolved. There is a small residual right axillary node on image number 12 which measures 16 x 12 mm. It previously measured 27 x 20 mm. It did show slight hypermetabolism on the PET scan. Other normal size axillary lymph nodes did not show any hyper metabolism.  The bony thorax is intact. No destructive bone lesions or spinal canal compromise.  The heart is normal in size. No pericardial effusion. No mediastinal or hilar mass. The bulky lymphadenopathy seen on the prior study has significantly decreased in size. The subcarinal  nodal mass previously measured 4.0 x 2.2 cm and now measures 2.0 x 1.3 cm. The aorta is normal in caliber. Stable atherosclerotic calcifications. There is clot noted in the left lower lobe pulmonary artery consistent with pulmonary embolism. There is also a small amount of string like adherent clot in the main left pulmonary artery.  Examination of the lung parenchyma demonstrates no worrisome pulmonary nodules. Stable area of subpleural scarring type changes in the right middle lobe. There is a small right pleural effusion with overlying atelectasis.  CT ABDOMEN AND PELVIS FINDINGS  The liver demonstrates diffuse fatty infiltration but no focal hepatic lesions or biliary dilatation. The  gallbladder is normal. No common bowel duct dilatation. The pancreas is normal. The spleen remains mildly enlarged and there are multiple low-attenuation lesions. The adrenal glands and kidneys are unremarkable and unchanged.  The stomach, duodenum, small bowel and colon are unremarkable. No inflammatory changes, mass lesions or obstructive findings. There are small scattered mesenteric and retroperitoneal lymph nodes. This reflects a significant improvement since the prior CT scan where there was bulky adenopathy. The aorta and branch vessels are patent. Stable atherosclerotic calcifications.  The uterus and ovaries are unremarkable and stable. The bladder is normal. No pelvic mass, adenopathy or free pelvic fluid collections. No inguinal mass or adenopathy. The borderline inguinal lymph nodes have resolved.  No significant bony findings.  Stable lumbar fusion changes.  IMPRESSION: 1. Significant interval reduction in axillary lymphadenopathy. There is 1 small residual right axillary lymph node measuring 16 x 12 mm which did show FDG uptake. 2. Significant reduction in mediastinal and hilar lymphadenopathy. 3. Left-sided pulmonary embolism. 4. Resolution of bulky abdominal lymphadenopathy. 5. Interval decrease in size of the spleen  (15 x 14 x 10.5 versus 12 x 12 x 9.5). Persistent small lesions.   Electronically Signed   By: Kalman Jewels M.D.   On: 06/16/2013 15:11   Nm Pet Image Restag (ps) Skull Base To Thigh  06/16/2013   CLINICAL DATA:  Subsequent treatment strategy for non-Hodgkin's lymphoma.  EXAM: NUCLEAR MEDICINE PET SKULL BASE TO THIGH  FASTING BLOOD GLUCOSE:  Value: 152m/dl  TECHNIQUE: 19.0 mCi F-18 FDG was injected intravenously. CT data was obtained and used for attenuation correction and anatomic localization only. (This was not acquired as a diagnostic CT examination.) Additional exam technical data entered on technologist worksheet.  COMPARISON:  CT scan 03/30/2013.  FINDINGS: NECK  No hypermetabolic lymph nodes in the neck.  CHEST  There is a single right axillary lymph node measuring 16 x 10 mm in demonstrates FDG uptake with SUV max of 5.5. This would suggest some residual metabolically active lymphoma. The mediastinal and hilar lymph nodes are much smaller than a previous chest CT and I do not see any definite metabolic activity. No worrisome pulmonary lesions. The right middle lobe subpleural density is relatively stable and is likely scarring or atelectasis. No abnormal FDG uptake is identified. There is a small right pleural effusion.  ABDOMEN/PELVIS  No persistence/residual lymphadenopathy in the abdomen. A few small scattered lymph nodes are noted but the bulky adenopathy has resolved. No metabolic activity is identified in the remaining small lymph nodes. Stable mild splenomegaly with numerous small lesions but no hyper metabolism.  SKELETON  Diffuse osseous uptake is likely due to rebound marrow activity or marrow stimulating drugs.  IMPRESSION: 1. Single metabolically active 16 x 10 mm in the right axilla. 2. Persistent but definitely smaller mediastinal and hilar lymph node without definite metabolic activity. 3. Resolution of abdominal adenopathy with some scattered residual lymph nodes but no metabolic  activity. 4. Persistent mild splenomegaly and small splenic lesions without obvious hyper metabolism.   Electronically Signed   By: MKalman JewelsM.D.   On: 06/16/2013 14:38     ASSESSMENT AND PLAN: Tonya Wise a 74year old female with a history of non-Hodgkin's lymphoma, stage lvB and has completed chemotherapy.   Initially she had normal LV function but this decreased following chemotherapy treatment.  Her last echo Doppler study last year showed an EF of 35-40% with diffuse hypokinesis and grade 2 diastolic dysfunction, which led to adjustment of her chemotherapy.  Presently,  she denies any shortness of breath.  She denies any palpitations.  She has had blood pressure lability and when I last saw her in February.  I further titrated her losartan to 100 mg daily.  She has felt improved on this.  However, last month when she went to the [place].  Her blood pressure was still labile 176/82.  She continues to be bradycardic with a pulse of 50, and I will not further titrate her.  On repeat evaluation by me.  Her blood pressure today was normal at 126/80.  I reviewed her echo Doppler study which had been recently done in February 2016.  This essentially was unchanged from her prior study without significant improvement in LV function with an ejection fraction of 35-40%.  She still had grade 2 diastolic dysfunction.  She continues to be mildly thrombocytopenic with the most recent platelet count 1 16,000 down from 122,000.  She denies any dizziness or palpitations.  There is no chest pain.  She will continue current therapy.  I will see her in 6 months for reevaluation.   Troy Sine, MD, [facility] 12/20/2014 8:43 AM

## 2015-01-24 ENCOUNTER — Other Ambulatory Visit: Payer: Self-pay

## 2015-04-07 ENCOUNTER — Other Ambulatory Visit (INDEPENDENT_AMBULATORY_CARE_PROVIDER_SITE_OTHER): Payer: Medicare Other

## 2015-04-07 DIAGNOSIS — Z23 Encounter for immunization: Secondary | ICD-10-CM | POA: Diagnosis not present

## 2015-04-19 ENCOUNTER — Encounter: Payer: Self-pay | Admitting: Family Medicine

## 2015-04-19 ENCOUNTER — Ambulatory Visit (INDEPENDENT_AMBULATORY_CARE_PROVIDER_SITE_OTHER): Payer: Medicare Other | Admitting: Family Medicine

## 2015-04-19 ENCOUNTER — Encounter: Payer: Self-pay | Admitting: Pharmacist

## 2015-04-19 VITALS — BP 98/50 | HR 100 | Temp 98.4°F | Resp 12 | Wt 138.4 lb

## 2015-04-19 DIAGNOSIS — J111 Influenza due to unidentified influenza virus with other respiratory manifestations: Secondary | ICD-10-CM | POA: Diagnosis not present

## 2015-04-19 DIAGNOSIS — R509 Fever, unspecified: Secondary | ICD-10-CM | POA: Diagnosis not present

## 2015-04-19 LAB — POCT INFLUENZA A/B
Influenza A, POC: NEGATIVE
Influenza B, POC: NEGATIVE

## 2015-04-19 NOTE — Patient Instructions (Signed)
You can take 1 Naprosyn twice per day or 2 Aleve twice per day and use Tylenol. If anything gets worse give me a call

## 2015-04-19 NOTE — Progress Notes (Signed)
   Subjective:    Patient ID: Tonya Wise, female    DOB: 07/20/1941, 74 y.o.   MRN: [id number]  HPI She will cut this morning complaining of nausea, headache, weakness, myalgias and diarrhea as well as a fever of 101.9. No cough, congestion, sore throat, earache.She is here with her husband.   Review of Systems     Objective:   Physical Exam Alert and in no distress. Tympanic membranes and canals are normal. Pharyngeal area is normal. Neck is supple without adenopathy or thyromegaly. Cardiac exam shows a regular sinus rhythm without murmurs or gallops. Lungs are clear to auscultation. Influenza test negative       Assessment & Plan:  Fever, unspecified fever cause - Plan: Influenza A/B  Flu syndrome I explained that I think this is a viral syndrome. Recommend treating the headache and fever. They apparently have Naprosyn at home and I recommended using it twice a day. They will call if worsening symptoms.

## 2015-05-26 ENCOUNTER — Other Ambulatory Visit: Payer: Medicare Other

## 2015-06-02 ENCOUNTER — Ambulatory Visit: Payer: Medicare Other | Admitting: Internal Medicine

## 2015-06-09 ENCOUNTER — Other Ambulatory Visit (HOSPITAL_BASED_OUTPATIENT_CLINIC_OR_DEPARTMENT_OTHER): Payer: Medicare Other

## 2015-06-09 DIAGNOSIS — C8590 Non-Hodgkin lymphoma, unspecified, unspecified site: Secondary | ICD-10-CM | POA: Diagnosis present

## 2015-06-09 LAB — CBC WITH DIFFERENTIAL/PLATELET
BASO%: 1.1 % (ref 0.0–2.0)
Basophils Absolute: 0.1 10*3/uL (ref 0.0–0.1)
EOS ABS: 0.1 10*3/uL (ref 0.0–0.5)
EOS%: 1.5 % (ref 0.0–7.0)
HEMATOCRIT: 41.2 % (ref 34.8–46.6)
HGB: 13.5 g/dL (ref 11.6–15.9)
LYMPH%: 25 % (ref 14.0–49.7)
MCH: 27.8 pg (ref 25.1–34.0)
MCHC: 32.8 g/dL (ref 31.5–36.0)
MCV: 84.8 fL (ref 79.5–101.0)
MONO#: 0.5 10*3/uL (ref 0.1–0.9)
MONO%: 9.6 % (ref 0.0–14.0)
NEUT#: 3 10*3/uL (ref 1.5–6.5)
NEUT%: 62.8 % (ref 38.4–76.8)
Platelets: 125 10*3/uL — ABNORMAL LOW (ref 145–400)
RBC: 4.86 10*6/uL (ref 3.70–5.45)
RDW: 14.7 % — ABNORMAL HIGH (ref 11.2–14.5)
WBC: 4.8 10*3/uL (ref 3.9–10.3)
lymph#: 1.2 10*3/uL (ref 0.9–3.3)

## 2015-06-09 LAB — COMPREHENSIVE METABOLIC PANEL (CC13)
ALT: 10 U/L (ref 0–55)
AST: 14 U/L (ref 5–34)
Albumin: 3.7 g/dL (ref 3.5–5.0)
Alkaline Phosphatase: 65 U/L (ref 40–150)
Anion Gap: 8 mEq/L (ref 3–11)
BUN: 15.5 mg/dL (ref 7.0–26.0)
CALCIUM: 9.3 mg/dL (ref 8.4–10.4)
CHLORIDE: 107 meq/L (ref 98–109)
CO2: 28 mEq/L (ref 22–29)
Creatinine: 1.2 mg/dL — ABNORMAL HIGH (ref 0.6–1.1)
EGFR: 47 mL/min/{1.73_m2} — ABNORMAL LOW (ref >90)
Glucose: 85 mg/dl (ref 70–140)
POTASSIUM: 3.8 meq/L (ref 3.5–5.1)
Sodium: 143 mEq/L (ref 136–145)
Total Bilirubin: 0.68 mg/dL (ref 0.20–1.20)
Total Protein: 5.8 g/dL — ABNORMAL LOW (ref 6.4–8.3)

## 2015-06-09 LAB — LACTATE DEHYDROGENASE (CC13): LDH: 195 U/L (ref 125–245)

## 2015-06-16 ENCOUNTER — Telehealth: Payer: Self-pay | Admitting: Internal Medicine

## 2015-06-16 ENCOUNTER — Ambulatory Visit (HOSPITAL_BASED_OUTPATIENT_CLINIC_OR_DEPARTMENT_OTHER): Payer: Medicare Other | Admitting: Internal Medicine

## 2015-06-16 ENCOUNTER — Encounter: Payer: Self-pay | Admitting: Internal Medicine

## 2015-06-16 VITALS — BP 197/87 | HR 87 | Temp 98.3°F | Resp 18 | Ht 63.0 in | Wt 137.2 lb

## 2015-06-16 DIAGNOSIS — D696 Thrombocytopenia, unspecified: Secondary | ICD-10-CM | POA: Diagnosis not present

## 2015-06-16 DIAGNOSIS — C8203 Follicular lymphoma grade I, intra-abdominal lymph nodes: Secondary | ICD-10-CM

## 2015-06-16 NOTE — Progress Notes (Signed)
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  PRIOR THERAPY:   Non-Hodgkins lymphoma ([place])   03/27/2013 - 03/31/2013 Hospital Admission Admitted to [facility] for profound anemia (Hgb 6.4).  C/o fevers, drenching nightsweats, weight lost.  Negative colonoscopy and EGD.  CT of C/A/P demonstrated significant lymphadenopathy suspcious of ympha.     03/31/2013 Initial Diagnosis Biopsy of R inguinal lymph node revealed Non-Hodgkins lymphoma. Diffuse Large B-cell Lymphoma   04/10/2013 - 04/24/2013 Hospital Admission Hypercalcemia, renal failure and dehydration.  Started min-RCHOP.    04/15/2013 Bone Marrow Biopsy Involvement with Diffuse Large B-cell Lymphoma. Stage IV NHL w B-symptoms.    04/18/2013 - 04/18/2013 Chemotherapy Mini R-CHOP Cycle #1 on 04/18/2013. Dose reduced to poor nutrion. It consisted on Cytoxan 400 mg/m2,Doxorubicin 25 mg/m2, Rituximab 375 mg/m2, Vincristine 32m.  Prednisone 100 mg daily for 9/13 - 9/16.   Received neupogen on 9/21, 9/27 -9/30.     04/25/2013 - 04/29/2013 Hospital Admission Admitted due to worsening mouth sores/oral thrush, odynophagia, failure to thrive.  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  [facility] CGwynneth Munson and discharged to home on 05/19/2013.     06/01/2013 - 06/01/2013 Chemotherapy Mini R-CHO #2 dosed as above.  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  Received neulasta shot on 06/02/13   06/16/2013 Cancer Staging Re-staging PET/CT demonstrated significant interval reduction in axillary lymphadenopathy.  There was 1 small residual right axillary lymph node measuring 16 x 12 mm which showed FDG uptake.  Signifcant reduction in mediastinal and hilar lymphadenopathy.     06/16/2013 Imaging CT of chest noted left-sided pulmonary embolim. Started on lovenox to coumadin bridge.     06/22/2013 - 06/22/2013 Chemotherapy Mini-R-CHO #3.  Received Neulasta on 06/23/2013.     07/16/2013 Imaging 2-D Echocardiogram demonstrates 35-45% EF with Grade 2 diastolic dysfunction.  Doxorubicin discontinued with consultation by cardiology (Dr. KClaiborne Billings.     08/19/2013 - 08/21/2013 Chemotherapy Mini-R-CEO #4.  Etoposide 25 mg/m2 instead of doxo due to above.  Etoposide 541mbid on day 2/3.  On day 3 (08/21/2013) she received 1 of two oral doses of ectoposide.    08/21/2013 - 08/26/2013 [facility] and had influenzae A and pneumonia. Completed tamiflu and oral antibiotics.    09/25/2013 - 09/27/2013 Chemotherapy Mini-R-CEO #5. Etoposide 25 mg/m2 25 mg bid on days 2/3.    10/16/2013 - 10/19/2013 Chemotherapy Mini-R-CEO # 6. Etoposide 25 mg/m2 bid on days 2/3. Neulasta on day#4.    11/05/2013 Imaging Interval resolution of the hypermetabolism identified within the enlarged right axillary lymph node seen previously. This lymph node has also decreased markedly in size in the interval. No new hypermetabolic lesions in the neck, chest, abdomen, or  pelvis.    11/19/2013 Bone Marrow Biopsy There is no evidence of a B-cell lymphoproliferative process in this material. Normal cytogenetics.    04/16/2014 Imaging CT scan Chest, abdomen: No evidence of recurrent lymphoma within the chest or abdomen.   Significant decrease in splenomegaly since prior exam.     CURRENT THERAPY: Observation.  INTERVAL HISTORY: Tonya TAYAG74.o. female returns to the clinic today for follow-up visit accompanied by her husband. The patient is feeling fine today with no specific complaints except  for increasing abdominal pain and bloating recently. She also has several episodes of diarrhea and scheduled to see her gastroenterologist Dr. Benson Norway. She is currently on  observation. She denied having any significant chest pain, shortness breath, cough or hemoptysis. The patient denied having any nausea or vomiting. She denied having any significant weight loss or night sweats. She had repeat lab work performed recently and she is here for evaluation and discussion of her lab results.  MEDICAL HISTORY: Past Medical History  Diagnosis Date  . Hypertension   . Renal insufficiency   . Osteopenia   . Allergy   . Heart murmur   . GERD (gastroesophageal reflux disease)   . Anemia   . Hypercalcemia 03/27/2013  . Diastolic dysfunction, grade I 04/12/2013  . Non-Hodgkins lymphoma 04/06/2013    ALLERGIES:  is allergic to prednisone and penicillins.  MEDICATIONS:  Current Outpatient Prescriptions  Medication Sig Dispense Refill  . losartan (COZAAR) 100 MG tablet Take 1/2 tablet twice a day 90 tablet 3  . metoprolol tartrate (LOPRESSOR) 25 MG tablet Take 1.5 tablets (37.5 mg total) by mouth 2 (two) times daily. Take 1 1/2 tab twice daily 270 tablet 3   No current facility-administered medications for this visit.    SURGICAL HISTORY:  Past Surgical History  Procedure Laterality Date  . Appendectomy    . Colonoscopy  2008  . Spine surgery    . Back surgery      LOWER BACK TWICE  . Abdominal hysterectomy    . Esophagogastroduodenoscopy N/A 03/29/2013    Procedure: ESOPHAGOGASTRODUODENOSCOPY (EGD);  Surgeon: Juanita Craver, MD;  Location: [facility] ENDOSCOPY;  Service: Endoscopy;  Laterality: N/A;  . Inguinal lymph node biopsy Right 03/31/2013    Procedure: INGUINAL LYMPH NODE BIOPSY;  Surgeon: Gwenyth Ober, MD;  Location: [facility];  Service: General;  Laterality: Right;    REVIEW OF SYSTEMS:  A comprehensive review of systems was negative except for: Gastrointestinal: positive for abdominal pain   PHYSICAL EXAMINATION: General appearance: alert, cooperative and no distress Head: Normocephalic, without obvious abnormality, atraumatic Neck: no adenopathy, no JVD, supple,  symmetrical, trachea midline and thyroid not enlarged, symmetric, no tenderness/mass/nodules Lymph nodes: Cervical, supraclavicular, and axillary nodes normal. Resp: clear to auscultation bilaterally Back: symmetric, no curvature. ROM normal. No CVA tenderness. Cardio: regular rate and rhythm, S1, S2 normal, no murmur, click, rub or gallop GI: soft, non-tender; bowel sounds normal; no masses,  no organomegaly Extremities: extremities normal, atraumatic, no cyanosis or edema Neurologic: Alert and oriented X 3, normal strength and tone. Normal symmetric reflexes. Normal coordination and gait  ECOG PERFORMANCE STATUS: 1 - Symptomatic but completely ambulatory  Blood pressure 197/87, pulse 87, temperature 98.3 F (36.8 C), temperature source Oral, resp. rate 18, height 5' 3" (1.6 m), weight 137 lb 3.2 oz (62.234 kg), SpO2 99 %.  LABORATORY DATA: Lab Results  Component Value Date   WBC 4.8 06/09/2015   HGB 13.5 06/09/2015   HCT 41.2 06/09/2015   MCV 84.8 06/09/2015   PLT 125* 06/09/2015      Chemistry      Component Value Date/Time   NA 143 06/09/2015 0810   NA 146* 10/11/2014 0914   K 3.8 06/09/2015 0810   K 4.4 10/11/2014 0914   CL 110 10/11/2014 0914   CO2 28 06/09/2015 0810   CO2 29 10/11/2014 0914   BUN 15.5 06/09/2015 0810   BUN 15 10/11/2014 0914   CREATININE 1.2* 06/09/2015 0810   CREATININE 0.99 10/11/2014 0914  CREATININE 1.13* 06/08/2014 0800      Component Value Date/Time   CALCIUM 9.3 06/09/2015 0810   CALCIUM 8.8 10/11/2014 0914   ALKPHOS 65 06/09/2015 0810   ALKPHOS 59 08/21/2013 1535   AST 14 06/09/2015 0810   AST 20 08/21/2013 1535   ALT 10 06/09/2015 0810   ALT 8 08/21/2013 1535   BILITOT 0.68 06/09/2015 0810   BILITOT 0.6 08/21/2013 1535       RADIOGRAPHIC STUDIES: No results found.  ASSESSMENT AND PLAN: This is a very pleasant 74 years old white female with history of stage IV large B-cell non-Hodgkin lymphoma status post systemic chemotherapy  initially with the CHOP Rituxan but this was modified secondary to steroid-induced psychosis as well as cardiomyopathy. Her lab work today is unremarkable except for the mild thrombocytopenia. The patient continues to complain of abdominal pain and bloating concerning for recurrent lymphoma. I will order CT scan of the abdomen pelvis to rule out any recurrence of her disease. There is no evidence for disease recurrence, I would see the patient back for follow-up visit in 6 months with repeat CBC, comprehensive metabolic panel and LDH. For the persistent diarrhea, I recommended for the patient to see her gastroenterologist. She was advised to call immediately if she has any concerning symptoms in the interval. The patient voices understanding of current disease status and treatment options and is in agreement with the current care plan.  All questions were answered. The patient knows to call the clinic with any problems, questions or concerns. We can certainly see the patient much sooner if necessary.  Disclaimer: This note was dictated with voice recognition software. Similar sounding words can inadvertently be transcribed and may not be corrected upon review.

## 2015-06-16 NOTE — Telephone Encounter (Signed)
per pof to sch pt appt-gave pt copy of avs-adv [facility] will call to sch scan °

## 2015-06-28 ENCOUNTER — Ambulatory Visit (HOSPITAL_COMMUNITY): Payer: Medicare Other

## 2015-06-29 ENCOUNTER — Ambulatory Visit (HOSPITAL_COMMUNITY)
Admission: RE | Admit: 2015-06-29 | Discharge: 2015-06-29 | Disposition: A | Discharge status: Home / Self Care | Payer: Medicare Other | Source: Ambulatory Visit | Attending: Internal Medicine | Admitting: Internal Medicine

## 2015-06-29 ENCOUNTER — Encounter (HOSPITAL_COMMUNITY): Payer: Self-pay

## 2015-06-29 DIAGNOSIS — Z8572 Personal history of non-Hodgkin lymphomas: Secondary | ICD-10-CM | POA: Diagnosis not present

## 2015-06-29 DIAGNOSIS — R109 Unspecified abdominal pain: Secondary | ICD-10-CM | POA: Insufficient documentation

## 2015-06-29 DIAGNOSIS — K8689 Other specified diseases of pancreas: Secondary | ICD-10-CM | POA: Diagnosis not present

## 2015-06-29 DIAGNOSIS — D696 Thrombocytopenia, unspecified: Secondary | ICD-10-CM | POA: Diagnosis present

## 2015-06-29 DIAGNOSIS — C8290 Follicular lymphoma, unspecified, unspecified site: Secondary | ICD-10-CM | POA: Diagnosis not present

## 2015-06-29 DIAGNOSIS — C8203 Follicular lymphoma grade I, intra-abdominal lymph nodes: Secondary | ICD-10-CM | POA: Diagnosis present

## 2015-06-29 MED ORDER — IOHEXOL 300 MG/ML  SOLN
100.0000 mL | Freq: Once | INTRAMUSCULAR | Status: AC | PRN
  Administered 2015-06-29: 100 mL via INTRAVENOUS

## 2015-06-29 MED ORDER — IOHEXOL 300 MG/ML  SOLN
50.0000 mL | Freq: Once | INTRAMUSCULAR | Status: AC | PRN
  Administered 2015-06-29: 50 mL via ORAL

## 2015-07-04 ENCOUNTER — Telehealth: Payer: Self-pay | Admitting: *Deleted

## 2015-07-04 NOTE — Telephone Encounter (Signed)
Scan is ok.

## 2015-07-04 NOTE — Telephone Encounter (Signed)
Patient called stating that she would like to know the results of her CT abdomen/pelvis. Message forwarded to MD [facility] [place].

## 2015-07-27 DIAGNOSIS — R197 Diarrhea, unspecified: Secondary | ICD-10-CM | POA: Diagnosis not present

## 2015-07-27 DIAGNOSIS — C8518 Unspecified B-cell lymphoma, lymph nodes of multiple sites: Secondary | ICD-10-CM | POA: Diagnosis not present

## 2015-08-17 ENCOUNTER — Other Ambulatory Visit: Payer: Self-pay | Admitting: Cardiovascular Disease

## 2015-08-18 NOTE — Telephone Encounter (Signed)
Rx request sent to pharmacy.  

## 2015-08-22 ENCOUNTER — Telehealth: Payer: Self-pay | Admitting: Cardiovascular Disease

## 2015-08-22 NOTE — Telephone Encounter (Signed)
Left detailed message for pt about medications. Left message for pt to call

## 2015-08-22 NOTE — Telephone Encounter (Signed)
(  1) pt called in stating that Dr. Claiborne Billings changed her dosage of Losartan to 1 1/2 tabs a day and she will need a new prescription called in to her pharmacy.   *STAT* If patient is at the pharmacy, call can be transferred to refill team.   1. Which medications need to be refilled? (please list name of each medication and dose if known) Losartan 100mg  (1 1/2 tabs BID daily)  2. Which pharmacy/location (including street and city if local pharmacy) is medication to be sent to? [facility] in [place]  3. Do they need a 30 day or 90 day supply? 30  (2) She also is complaining that her Diastolic number is still running high and would like to know what to do. Please f/u with her   Thanks

## 2015-08-22 NOTE — Telephone Encounter (Signed)
The losartan dose is 100 mg daily; she can take 50 mg bid; not 100 mg bid

## 2015-08-22 NOTE — Telephone Encounter (Signed)
Spoke with pt, I see that the losartan was increased to 100 mg twice daily but not 1 and 1/2 twice daily. She reports she was told to do this at her last office visit. Do not see documentation in her chart will get okay from dr Claiborne Billings for this dose. She does not check her bp at hime and is having no problems.

## 2015-08-23 MED ORDER — LOSARTAN POTASSIUM 100 MG PO TABS: 100.0000 mg | ORAL_TABLET | Freq: Every day | ORAL | Status: DC

## 2015-08-23 NOTE — Telephone Encounter (Signed)
Returning your call. °

## 2015-08-23 NOTE — Telephone Encounter (Signed)
Spoke with pt, she voiced understanding to take losartan 100 mg once daily. Refill sent to the pharmacy.

## 2015-08-29 DIAGNOSIS — R197 Diarrhea, unspecified: Secondary | ICD-10-CM | POA: Diagnosis not present

## 2015-08-31 ENCOUNTER — Telehealth: Payer: Self-pay | Admitting: Family Medicine

## 2015-09-01 ENCOUNTER — Ambulatory Visit (INDEPENDENT_AMBULATORY_CARE_PROVIDER_SITE_OTHER): Payer: Medicare Other | Admitting: Family Medicine

## 2015-09-01 ENCOUNTER — Encounter: Payer: Self-pay | Admitting: Family Medicine

## 2015-09-01 VITALS — BP 140/88 | HR 61 | Wt 138.0 lb

## 2015-09-01 DIAGNOSIS — I5043 Acute on chronic combined systolic (congestive) and diastolic (congestive) heart failure: Secondary | ICD-10-CM

## 2015-09-01 DIAGNOSIS — I10 Essential (primary) hypertension: Secondary | ICD-10-CM

## 2015-09-01 DIAGNOSIS — I519 Heart disease, unspecified: Secondary | ICD-10-CM | POA: Diagnosis not present

## 2015-09-01 DIAGNOSIS — I5189 Other ill-defined heart diseases: Secondary | ICD-10-CM

## 2015-09-01 MED ORDER — METOPROLOL TARTRATE 25 MG PO TABS: 37.5000 mg | ORAL_TABLET | Freq: Two times a day (BID) | ORAL | Status: DC

## 2015-09-01 NOTE — Progress Notes (Signed)
   Subjective:    Patient ID: Tonya Wise, female    DOB: April 02, 1941, 75 y.o.   MRN: [id number]  HPI She is here for discussion concerning her hypertension. She has noted elevated blood pressure readings intermittently over the last several months. Presently she is on HCTZ.   Review of Systems     Objective:   Physical Exam Alert and in no distress. Blood pressure is recorded.       Assessment & Plan:  Essential hypertension - Plan: metoprolol tartrate (LOPRESSOR) 25 MG tablet  Acute on chronic combined systolic and diastolic heart failure (HCC) - Plan: metoprolol tartrate (LOPRESSOR) 25 MG tablet  Diastolic dysfunction grade 2 - Plan: metoprolol tartrate (LOPRESSOR) 25 MG tablet I will renew her Lopressor. I suspect that she might have a variation of whitecoat hypertension. She will come back here with her blood pressure cuff so we can measure difference and keep a better record of this. She is comfortable with that.

## 2015-09-06 ENCOUNTER — Other Ambulatory Visit: Payer: Medicare Other

## 2015-09-08 ENCOUNTER — Other Ambulatory Visit: Payer: Self-pay | Admitting: Family Medicine

## 2015-09-08 ENCOUNTER — Other Ambulatory Visit: Payer: Medicare Other

## 2015-09-08 MED ORDER — LOSARTAN POTASSIUM-HCTZ 100-12.5 MG PO TABS: 1.0000 | ORAL_TABLET | Freq: Every day | ORAL | Status: DC

## 2015-09-08 MED ORDER — METOPROLOL TARTRATE 50 MG PO TABS: 50.0000 mg | ORAL_TABLET | Freq: Two times a day (BID) | ORAL | Status: DC

## 2015-09-08 NOTE — Progress Notes (Signed)
She is here for a blood pressure recheck. Apparently she has been getting higher blood pressure readings in other offices and is here for reevaluation. Blood pressure today in the office is as recorded. Her blood pressure cuff was compared and is fairly close. I have a feeling that the nurses here have not pumped up the cuffs high enough. I will readjust her medications and have her return here in one week for follow-up.

## 2015-09-13 ENCOUNTER — Ambulatory Visit: Payer: Medicare Other | Admitting: Family Medicine

## 2015-09-20 ENCOUNTER — Encounter: Payer: Self-pay | Admitting: Family Medicine

## 2015-09-20 ENCOUNTER — Ambulatory Visit (INDEPENDENT_AMBULATORY_CARE_PROVIDER_SITE_OTHER): Payer: Medicare Other | Admitting: Family Medicine

## 2015-09-20 VITALS — BP 152/92 | HR 65 | Ht 63.0 in | Wt 140.8 lb

## 2015-09-20 DIAGNOSIS — I10 Essential (primary) hypertension: Secondary | ICD-10-CM | POA: Diagnosis not present

## 2015-09-20 NOTE — Progress Notes (Signed)
   Subjective:    Patient ID: Tonya Wise, female    DOB: 1941-03-21, 75 y.o.   MRN: [id number]  HPI Is here for a blood pressure recheck. She did check her blood pressure machine compared to ours and it is accurate. She is getting readings in the mid 150 over mid 90 range. She continues on metoprolol and losartan and is having no difficulty with that.   Review of Systems     Objective:   Physical Exam Alert and in no distress. Blood pressure is recorded.      Assessment & Plan:  Essential hypertension she has been on this combination for less than one month. Recommend she call me in a month with her blood pressure reading at home. Discussed taking it in the resting position sitting position with the arm at heart level.

## 2015-09-20 NOTE — Patient Instructions (Signed)
Call me in one month and give me your blood pressure reading

## 2015-10-13 ENCOUNTER — Ambulatory Visit: Payer: Medicare Other | Admitting: Cardiovascular Disease

## 2015-10-25 ENCOUNTER — Encounter (HOSPITAL_COMMUNITY): Payer: Self-pay | Admitting: *Deleted

## 2015-10-25 ENCOUNTER — Emergency Department (HOSPITAL_COMMUNITY): Payer: Medicare Other

## 2015-10-25 ENCOUNTER — Emergency Department (HOSPITAL_COMMUNITY)
Admission: EM | Admit: 2015-10-25 | Discharge: 2015-10-25 | Disposition: A | Discharge status: Home / Self Care | Payer: Medicare Other | Attending: Emergency Medicine | Admitting: Emergency Medicine

## 2015-10-25 DIAGNOSIS — Z8739 Personal history of other diseases of the musculoskeletal system and connective tissue: Secondary | ICD-10-CM | POA: Insufficient documentation

## 2015-10-25 DIAGNOSIS — Z8639 Personal history of other endocrine, nutritional and metabolic disease: Secondary | ICD-10-CM | POA: Diagnosis not present

## 2015-10-25 DIAGNOSIS — M25572 Pain in left ankle and joints of left foot: Secondary | ICD-10-CM | POA: Diagnosis not present

## 2015-10-25 DIAGNOSIS — Z880 Allergy status to penicillin: Secondary | ICD-10-CM | POA: Insufficient documentation

## 2015-10-25 DIAGNOSIS — T148 Other injury of unspecified body region: Secondary | ICD-10-CM | POA: Diagnosis not present

## 2015-10-25 DIAGNOSIS — Z8572 Personal history of non-Hodgkin lymphomas: Secondary | ICD-10-CM | POA: Diagnosis not present

## 2015-10-25 DIAGNOSIS — R011 Cardiac murmur, unspecified: Secondary | ICD-10-CM | POA: Diagnosis not present

## 2015-10-25 DIAGNOSIS — S92322A Displaced fracture of second metatarsal bone, left foot, initial encounter for closed fracture: Secondary | ICD-10-CM | POA: Insufficient documentation

## 2015-10-25 DIAGNOSIS — Y998 Other external cause status: Secondary | ICD-10-CM | POA: Insufficient documentation

## 2015-10-25 DIAGNOSIS — Z87448 Personal history of other diseases of urinary system: Secondary | ICD-10-CM | POA: Insufficient documentation

## 2015-10-25 DIAGNOSIS — S8252XA Displaced fracture of medial malleolus of left tibia, initial encounter for closed fracture: Secondary | ICD-10-CM | POA: Diagnosis not present

## 2015-10-25 DIAGNOSIS — Y9289 Other specified places as the place of occurrence of the external cause: Secondary | ICD-10-CM | POA: Insufficient documentation

## 2015-10-25 DIAGNOSIS — Y9301 Activity, walking, marching and hiking: Secondary | ICD-10-CM | POA: Insufficient documentation

## 2015-10-25 DIAGNOSIS — Z862 Personal history of diseases of the blood and blood-forming organs and certain disorders involving the immune mechanism: Secondary | ICD-10-CM | POA: Insufficient documentation

## 2015-10-25 DIAGNOSIS — S82852A Displaced trimalleolar fracture of left lower leg, initial encounter for closed fracture: Secondary | ICD-10-CM | POA: Insufficient documentation

## 2015-10-25 DIAGNOSIS — Z79899 Other long term (current) drug therapy: Secondary | ICD-10-CM | POA: Insufficient documentation

## 2015-10-25 DIAGNOSIS — S99912A Unspecified injury of left ankle, initial encounter: Secondary | ICD-10-CM | POA: Diagnosis present

## 2015-10-25 DIAGNOSIS — S82832A Other fracture of upper and lower end of left fibula, initial encounter for closed fracture: Secondary | ICD-10-CM | POA: Diagnosis not present

## 2015-10-25 DIAGNOSIS — S82302A Unspecified fracture of lower end of left tibia, initial encounter for closed fracture: Secondary | ICD-10-CM | POA: Diagnosis not present

## 2015-10-25 DIAGNOSIS — Z87891 Personal history of nicotine dependence: Secondary | ICD-10-CM | POA: Diagnosis not present

## 2015-10-25 DIAGNOSIS — W108XXA Fall (on) (from) other stairs and steps, initial encounter: Secondary | ICD-10-CM | POA: Insufficient documentation

## 2015-10-25 DIAGNOSIS — Z8719 Personal history of other diseases of the digestive system: Secondary | ICD-10-CM | POA: Diagnosis not present

## 2015-10-25 DIAGNOSIS — S92302A Fracture of unspecified metatarsal bone(s), left foot, initial encounter for closed fracture: Secondary | ICD-10-CM

## 2015-10-25 DIAGNOSIS — S82842A Displaced bimalleolar fracture of left lower leg, initial encounter for closed fracture: Secondary | ICD-10-CM | POA: Diagnosis not present

## 2015-10-25 DIAGNOSIS — I10 Essential (primary) hypertension: Secondary | ICD-10-CM | POA: Insufficient documentation

## 2015-10-25 MED ORDER — MORPHINE SULFATE (PF) 4 MG/ML IV SOLN
4.0000 mg | Freq: Once | INTRAVENOUS | Status: AC
  Administered 2015-10-25: 4 mg via INTRAVENOUS
  Filled 2015-10-25: qty 1

## 2015-10-25 MED ORDER — HYDROCODONE-ACETAMINOPHEN 5-325 MG PO TABS: 1.0000 | ORAL_TABLET | Freq: Four times a day (QID) | ORAL | Status: DC | PRN

## 2015-10-25 NOTE — ED Notes (Signed)
Bed: [id number] Expected date:  Expected time:  Means of arrival:  Comments: EMS/fall/ankle deformity

## 2015-10-25 NOTE — ED Notes (Signed)
Patient transported to X-ray 

## 2015-10-25 NOTE — ED Notes (Signed)
Per EMS, pt from home, was carrying her laundry down the steps, missed a step and fell.  Reports L ankle pain.  Swelling noted at this time.  Obvious deformity noted per EMS.  Pt is A&Ox 4.  [place] LOC.  Denies hitting her head.

## 2015-10-25 NOTE — Discharge Instructions (Signed)
Ankle Fracture A fracture is a break in a bone. The ankle joint is made up of three bones. These include the lower (distal)sections of your lower leg bones, called the tibia and fibula, along with a bone in your foot, called the talus. Depending on how bad the break is and if more than one ankle joint bone is broken, a cast or splint is used to protect and keep your injured bone from moving while it heals. Sometimes, surgery is required to help the fracture heal properly.  There are two general types of fractures:  Stable fracture. This includes a single fracture line through one bone, with no injury to ankle ligaments. A fracture of the talus that does not have any displacement (movement of the bone on either side of the fracture line) is also stable.  Unstable fracture. This includes more than one fracture line through one or more bones in the ankle joint. It also includes fractures that have displacement of the bone on either side of the fracture line. CAUSES  A direct blow to the ankle.   Quickly and severely twisting your ankle.  Trauma, such as a car accident or falling from a significant height. RISK FACTORS You may be at a higher risk of ankle fracture if:  You have certain medical conditions.  You are involved in high-impact sports.  You are involved in a high-impact car accident. SIGNS AND SYMPTOMS   Tender and swollen ankle.  Bruising around the injured ankle.  Pain on movement of the ankle.  Difficulty walking or putting weight on the ankle.  A cold foot below the site of the ankle injury. This can occur if the blood vessels passing through your injured ankle were also damaged.  Numbness in the foot below the site of the ankle injury. DIAGNOSIS  An ankle fracture is usually diagnosed with a physical exam and X-rays. A CT scan may also be required for complex fractures. TREATMENT  Stable fractures are treated with a cast or splint and using crutches to avoid putting  weight on your injured ankle. This is followed by an ankle strengthening program. Some patients require a special type of cast, depending on other medical problems they may have. Unstable fractures require surgery to ensure the bones heal properly. Your health care provider will tell you what type of fracture you have and the best treatment for your condition. HOME CARE INSTRUCTIONS   Review correct crutch use with your health care provider and use your crutches as directed. Safe use of crutches is extremely important. Misuse of crutches can cause you to fall or cause injury to nerves in your hands or armpits.  Do not put weight or pressure on the injured ankle until directed by your health care provider.  To lessen the swelling, keep the injured leg elevated while sitting or lying down.  Apply ice to the injured area:  Put ice in a plastic bag.  Place a towel between your cast and the bag.  Leave the ice on for 20 minutes, 2-3 times a day.  If you have a plaster or fiberglass cast:  Do not try to scratch the skin under the cast with any objects. This can increase your risk of skin infection.  Check the skin around the cast every day. You may put lotion on any red or sore areas.  Keep your cast dry and clean.  If you have a plaster splint:  Wear the splint as directed.  You may loosen the elastic   around the splint if your toes become numb, tingle, or turn cold or blue.  Do not put pressure on any part of your cast or splint; it may break. Rest your cast only on a pillow the first 24 hours until it is fully hardened.  Your cast or splint can be protected during bathing with a plastic bag sealed to your skin with medical tape. Do not lower the cast or splint into water.  Take medicines as directed by your health care provider. Only take over-the-counter or prescription medicines for pain, discomfort, or fever as directed by your health care provider.  Do not drive a vehicle until  your health care provider specifically tells you it is safe to do so.  If your health care provider has given you a follow-up appointment, it is very important to keep that appointment. Not keeping the appointment could result in a chronic or permanent injury, pain, and disability. If you have any problem keeping the appointment, call the facility for assistance. SEEK MEDICAL CARE IF: You develop increased swelling or discomfort. SEEK IMMEDIATE MEDICAL CARE IF:   Your cast gets damaged or breaks.  You have continued severe pain.  You develop new pain or swelling after the cast was put on.  Your skin or toenails below the injury turn blue or gray.  Your skin or toenails below the injury feel cold, numb, or have loss of sensitivity to touch.  There is a bad smell or pus draining from under the cast. MAKE SURE YOU:   Understand these instructions.  Will watch your condition.  Will get help right away if you are not doing well or get worse.   This information is not intended to replace advice given to you by your health care provider. Make sure you discuss any questions you have with your health care provider.   Document Released: 07/13/2000 Document Revised: 07/21/2013 Document Reviewed: 02/12/2013 Elsevier Interactive Patient Education 2016 [employer]  

## 2015-10-25 NOTE — ED Provider Notes (Signed)
CSN: [id number]     Arrival date & time 10/25/15  0930 History   First MD Initiated Contact with Patient 10/25/15 0932     Chief Complaint  Patient presents with  . Fall  . Ankle Pain   HPI Patient presents to the emergency room with complaints of ankle pain. Patient was walking down the stairs with a laundry basket. She missed a step and fell landing on and twisting her left ankle.  The patient developed acute pain in her ankle and has not been able to walk. She had to call EMS and was brought into the emergency room after having her ankle splinted. She is also given a dose of pain medications.  She denies any loss of consciousness. She denies any head injury. She denies any back or hip pain. No pain in her upper extremities. She denies any pain in her right lower extremity. Past Medical History  Diagnosis Date  . Hypertension   . Renal insufficiency   . Osteopenia   . Allergy   . Heart murmur   . GERD (gastroesophageal reflux disease)   . Anemia   . Hypercalcemia 03/27/2013  . Diastolic dysfunction, grade I 04/12/2013  . Non-Hodgkins lymphoma ([place]) 04/06/2013   Past Surgical History  Procedure Laterality Date  . Appendectomy    . Colonoscopy  2008  . Spine surgery    . Back surgery      LOWER BACK TWICE  . Abdominal hysterectomy    . Esophagogastroduodenoscopy N/A 03/29/2013    Procedure: ESOPHAGOGASTRODUODENOSCOPY (EGD);  Surgeon: Juanita Craver, MD;  Location: [facility] ENDOSCOPY;  Service: Endoscopy;  Laterality: N/A;  . Inguinal lymph node biopsy Right 03/31/2013    Procedure: INGUINAL LYMPH NODE BIOPSY;  Surgeon: Gwenyth Ober, MD;  Location: [place];  Service: General;  Laterality: Right;   Family History  Problem Relation Age of Onset  . Heart attack Father 83  . Leukemia Mother   . Diabetes Brother   . Diabetes Sister    Social History  Substance Use Topics  . Smoking status: Former Smoker -- 0.50 packs/day for 2 years    Types: Cigarettes    Quit date: 07/30/1960  .  Smokeless tobacco: Never Used  . Alcohol Use: Yes     Comment: rare-wine drinker   OB History    No data available     Review of Systems  All other systems reviewed and are negative.     Allergies  Prednisone and Penicillins  Home Medications   Prior to Admission medications   Medication Sig Start Date End Date Taking? Authorizing Provider  losartan-hydrochlorothiazide (HYZAAR) 100-12.5 MG tablet Take 1 tablet by mouth daily. 09/08/15  Yes Denita Lung, MD  metoprolol (LOPRESSOR) 50 MG tablet Take 1 tablet (50 mg total) by mouth 2 (two) times daily. 09/08/15  Yes Denita Lung, MD  Misc Natural Products (OSTEO BI-FLEX TRIPLE STRENGTH) TABS Take 1 tablet by mouth 2 (two) times daily.   Yes Historical Provider, MD  vitamin B-12 (CYANOCOBALAMIN) 500 MCG tablet Take 500 mcg by mouth daily.   Yes Historical Provider, MD  HYDROcodone-acetaminophen (NORCO/VICODIN) 5-325 MG tablet Take 1 tablet by mouth every 6 (six) hours as needed. 10/25/15   Dorie Rank, MD   BP 136/71 mmHg  Pulse 62  Temp(Src) 97.7 F (36.5 C) (Oral)  Resp 16  SpO2 94% Physical Exam  Constitutional: She appears well-developed and well-nourished. No distress.  HENT:  Head: Normocephalic and atraumatic.  Right Ear: External ear  normal.  Left Ear: External ear normal.  Eyes: Conjunctivae are normal. Right eye exhibits no discharge. Left eye exhibits no discharge. No scleral icterus.  Neck: Neck supple. No tracheal deviation present.  Cardiovascular: Normal rate, regular rhythm and intact distal pulses.   Pulmonary/Chest: Effort normal and breath sounds normal. No stridor. No respiratory distress. She has no wheezes. She has no rales.  Abdominal: Soft. Bowel sounds are normal. She exhibits no distension. There is no tenderness. There is no rebound and no guarding.  Musculoskeletal: She exhibits no edema.       Left knee: She exhibits no swelling and no effusion.       Left ankle: She exhibits swelling. Tenderness.  Lateral malleolus, medial malleolus, head of 5th metatarsal and proximal fibula tenderness found.       Cervical back: Normal.       Thoracic back: Normal.       Lumbar back: Normal.  No tenderness to palpation in the bilateral upper extremities, right lower extremity without tenderness  Neurological: She is alert. She has normal strength. No cranial nerve deficit (no facial droop, extraocular movements intact, no slurred speech) or sensory deficit. She exhibits normal muscle tone. She displays no seizure activity. Coordination normal.  Skin: Skin is warm and dry. No rash noted.  Psychiatric: She has a normal mood and affect.  Nursing note and vitals reviewed.   ED Course  Procedures (including critical care time) Labs Review Labs Reviewed - No data to display  Imaging Review Dg Tibia/fibula Left  10/25/2015  CLINICAL DATA:  Fall, walking down steps missed a step EXAM: LEFT TIBIA AND FIBULA - 2 VIEW COMPARISON:  None. FINDINGS: Four views of the left tibia fibula submitted. There is oblique mild displaced fracture in distal left fibula. Minimal displaced fracture in distal tibia medial malleolus. Significant lateral soft tissue swelling. There is probable minimal displaced fracture in distal tibia posterior malleolus seen on lateral view. IMPRESSION: There is oblique mild displaced fracture in distal left fibula. Minimal displaced fracture in distal tibia medial malleolus. Significant lateral soft tissue swelling. There is probable minimal displaced fracture in distal tibia posterior malleolus seen on lateral view. Electronically Signed   By: Lahoma Crocker M.D.   On: 10/25/2015 10:23   Dg Ankle Complete Left  10/25/2015  CLINICAL DATA:  Fall down steps with ankle pain, initial encounter EXAM: LEFT ANKLE COMPLETE - 3+ VIEW COMPARISON:  None. FINDINGS: Mildly displaced fractures of the distal tibia and fibula are noted. No definitive posterior malleolar fracture is seen. Postoperative changes are  noted in the first and fifth metatarsals. Soft tissue swelling is noted. No other focal abnormality is noted. IMPRESSION: Mildly displaced fractures of the distal tibia and fibula. Electronically Signed   By: Inez Catalina M.D.   On: 10/25/2015 10:23   Dg Foot Complete Left  10/25/2015  CLINICAL DATA:  Fall.  Initial evaluation. EXAM: LEFT FOOT - COMPLETE 3+ VIEW COMPARISON:  No recent prior. FINDINGS: Postsurgical changes and old healed fracture left first and fifth metatarsals. Displaced fractures noted the base of the left second metatarsal suggesting a Lisfranc injury. Fracture of the base of the left third metatarsal cannot be completely excluded. Diffuse osteopenia degenerative change. Trimalleolar ankle fracture appears to be present. IMPRESSION: 1. Displaced fracture of the base of the second metatarsal. Possible fracture the base of the third metatarsal. This suggests a Lisfranc injury. 2.  Trimalleolar ankle fracture appears to be present. Electronically Signed   By: Marcello Moores  Register   On: 10/25/2015 10:22   I have personally reviewed and evaluated these images and lab results as part of my medical decision-making.  Medications  morphine 4 MG/ML injection 4 mg (4 mg Intravenous Given 10/25/15 1016)  morphine 4 MG/ML injection 4 mg (4 mg Intravenous Given 10/25/15 1056)     MDM   Final diagnoses:  Bimalleolar fracture of left ankle, closed, initial encounter  Metatarsal fracture, left, closed, initial encounter    Pt's x-rays show a bimaleolar fx.   Mildly displaced.  No need for reduction. Also with a metatarsal fx, ?lisfranc.   Will contact orthopedics, Dr Alvan Dame to discuss treatment.  Dr Alvan Dame recommends splint, follow up with Dr Doran Durand in 1 week.  Non weight bearing.  Ice to help with swelling.  Elevate.    Dorie Rank, MD 10/25/15 [phone]

## 2015-10-26 ENCOUNTER — Telehealth: Payer: Self-pay | Admitting: Family Medicine

## 2015-10-26 NOTE — Telephone Encounter (Signed)
Tonya Wise called and Tonya Wise fell yesterday and broker her ankle.  She is struggling with the crutches and is requesting a wheelchair.  They have used the [facility] on [address] before.  Pt ph [phone]

## 2015-10-27 ENCOUNTER — Telehealth: Payer: Self-pay | Admitting: Family Medicine

## 2015-10-27 NOTE — Telephone Encounter (Signed)
Pt husband has come by to pick up wheelchair rx as surgeon will not give, since they have not seen patient.

## 2015-10-27 NOTE — Telephone Encounter (Signed)
This is a reasonable request but she really should work this through with her orthopedic surgeon so he is aware of this. Also make sure that she has been getting upper instruction on how to use the crutches.

## 2015-10-27 NOTE — Telephone Encounter (Signed)
Called pt talked with Denice Paradise.  Advised we have wheelchair rx ready.  Also asked if pt was taught how to use crutches .  Denice Paradise advised yes.

## 2015-10-27 NOTE — Telephone Encounter (Signed)
Husband called to f/u on wheelchair order stating that pt has a doctor appt and wa shoping to be able to get a wheelchair today for pt because he does not think that she will be able to move around well with the crutches to get out

## 2015-10-31 ENCOUNTER — Encounter (HOSPITAL_BASED_OUTPATIENT_CLINIC_OR_DEPARTMENT_OTHER): Payer: Self-pay | Admitting: *Deleted

## 2015-10-31 ENCOUNTER — Other Ambulatory Visit: Payer: Self-pay | Admitting: Orthopedic Surgery

## 2015-10-31 DIAGNOSIS — S82852A Displaced trimalleolar fracture of left lower leg, initial encounter for closed fracture: Secondary | ICD-10-CM | POA: Diagnosis not present

## 2015-11-03 ENCOUNTER — Encounter (HOSPITAL_BASED_OUTPATIENT_CLINIC_OR_DEPARTMENT_OTHER): Payer: Self-pay

## 2015-11-03 ENCOUNTER — Ambulatory Visit (HOSPITAL_BASED_OUTPATIENT_CLINIC_OR_DEPARTMENT_OTHER): Payer: Medicare Other | Admitting: Anesthesiology

## 2015-11-03 ENCOUNTER — Encounter (HOSPITAL_BASED_OUTPATIENT_CLINIC_OR_DEPARTMENT_OTHER)
Admission: RE | Disposition: A | Discharge status: Home / Self Care | Payer: Self-pay | Source: Ambulatory Visit | Attending: Orthopedic Surgery

## 2015-11-03 ENCOUNTER — Ambulatory Visit (HOSPITAL_BASED_OUTPATIENT_CLINIC_OR_DEPARTMENT_OTHER)
Admission: RE | Admit: 2015-11-03 | Discharge: 2015-11-03 | Disposition: A | Discharge status: Home / Self Care | Payer: Medicare Other | Source: Ambulatory Visit | Attending: Orthopedic Surgery | Admitting: Orthopedic Surgery

## 2015-11-03 DIAGNOSIS — Z90710 Acquired absence of both cervix and uterus: Secondary | ICD-10-CM | POA: Insufficient documentation

## 2015-11-03 DIAGNOSIS — I10 Essential (primary) hypertension: Secondary | ICD-10-CM | POA: Diagnosis not present

## 2015-11-03 DIAGNOSIS — Z9049 Acquired absence of other specified parts of digestive tract: Secondary | ICD-10-CM | POA: Insufficient documentation

## 2015-11-03 DIAGNOSIS — M25572 Pain in left ankle and joints of left foot: Secondary | ICD-10-CM | POA: Diagnosis not present

## 2015-11-03 DIAGNOSIS — Y92009 Unspecified place in unspecified non-institutional (private) residence as the place of occurrence of the external cause: Secondary | ICD-10-CM | POA: Insufficient documentation

## 2015-11-03 DIAGNOSIS — S82852A Displaced trimalleolar fracture of left lower leg, initial encounter for closed fracture: Secondary | ICD-10-CM | POA: Diagnosis not present

## 2015-11-03 DIAGNOSIS — Z87891 Personal history of nicotine dependence: Secondary | ICD-10-CM | POA: Insufficient documentation

## 2015-11-03 DIAGNOSIS — R011 Cardiac murmur, unspecified: Secondary | ICD-10-CM | POA: Diagnosis not present

## 2015-11-03 DIAGNOSIS — K219 Gastro-esophageal reflux disease without esophagitis: Secondary | ICD-10-CM | POA: Insufficient documentation

## 2015-11-03 DIAGNOSIS — W19XXXA Unspecified fall, initial encounter: Secondary | ICD-10-CM | POA: Insufficient documentation

## 2015-11-03 DIAGNOSIS — I509 Heart failure, unspecified: Secondary | ICD-10-CM | POA: Diagnosis not present

## 2015-11-03 DIAGNOSIS — G8918 Other acute postprocedural pain: Secondary | ICD-10-CM | POA: Diagnosis not present

## 2015-11-03 DIAGNOSIS — Z8572 Personal history of non-Hodgkin lymphomas: Secondary | ICD-10-CM | POA: Diagnosis not present

## 2015-11-03 DIAGNOSIS — Z79899 Other long term (current) drug therapy: Secondary | ICD-10-CM | POA: Diagnosis not present

## 2015-11-03 HISTORY — DX: Other fracture of left lower leg, initial encounter for closed fracture: S82.892A

## 2015-11-03 HISTORY — PX: ORIF ANKLE FRACTURE: SHX5408

## 2015-11-03 LAB — POCT I-STAT, CHEM 8
BUN: 21 mg/dL — AB (ref 6–20)
CREATININE: 1.1 mg/dL — AB (ref 0.44–1.00)
Calcium, Ion: 1.15 mmol/L (ref 1.13–1.30)
Chloride: 101 mmol/L (ref 101–111)
GLUCOSE: 103 mg/dL — AB (ref 65–99)
HEMATOCRIT: 36 % (ref 36.0–46.0)
Hemoglobin: 12.2 g/dL (ref 12.0–15.0)
Potassium: 4 mmol/L (ref 3.5–5.1)
Sodium: 140 mmol/L (ref 135–145)
TCO2: 29 mmol/L (ref 0–100)

## 2015-11-03 SURGERY — OPEN REDUCTION INTERNAL FIXATION (ORIF) ANKLE FRACTURE
Anesthesia: General | Site: Ankle | Laterality: Left

## 2015-11-03 MED ORDER — ONDANSETRON HCL 4 MG/2ML IJ SOLN
INTRAMUSCULAR | Status: DC | PRN
  Administered 2015-11-03: 4 mg via INTRAVENOUS

## 2015-11-03 MED ORDER — CHLORHEXIDINE GLUCONATE 4 % EX LIQD: 60.0000 mL | Freq: Once | CUTANEOUS | Status: DC

## 2015-11-03 MED ORDER — ASPIRIN EC 81 MG PO TBEC: 81.0000 mg | DELAYED_RELEASE_TABLET | Freq: Two times a day (BID) | ORAL | Status: AC

## 2015-11-03 MED ORDER — HYDROMORPHONE HCL 1 MG/ML IJ SOLN
INTRAMUSCULAR | Status: AC
  Filled 2015-11-03: qty 1

## 2015-11-03 MED ORDER — HYDROCODONE-ACETAMINOPHEN 5-325 MG PO TABS: 1.0000 | ORAL_TABLET | Freq: Four times a day (QID) | ORAL | Status: DC | PRN

## 2015-11-03 MED ORDER — GLYCOPYRROLATE 0.2 MG/ML IJ SOLN: 0.2000 mg | Freq: Once | INTRAMUSCULAR | Status: DC | PRN

## 2015-11-03 MED ORDER — BUPIVACAINE-EPINEPHRINE (PF) 0.5% -1:200000 IJ SOLN
INTRAMUSCULAR | Status: DC | PRN
  Administered 2015-11-03: 30 mL via PERINEURAL

## 2015-11-03 MED ORDER — SCOPOLAMINE 1 MG/3DAYS TD PT72: 1.0000 | MEDICATED_PATCH | Freq: Once | TRANSDERMAL | Status: DC | PRN

## 2015-11-03 MED ORDER — VANCOMYCIN HCL IN DEXTROSE 1-5 GM/200ML-% IV SOLN
INTRAVENOUS | Status: AC
  Filled 2015-11-03: qty 200

## 2015-11-03 MED ORDER — MIDAZOLAM HCL 2 MG/2ML IJ SOLN
INTRAMUSCULAR | Status: AC
  Filled 2015-11-03: qty 2

## 2015-11-03 MED ORDER — BUPIVACAINE HCL (PF) 0.25 % IJ SOLN
INTRAMUSCULAR | Status: DC | PRN
  Administered 2015-11-03: 5 mL

## 2015-11-03 MED ORDER — LACTATED RINGERS IV SOLN
INTRAVENOUS | Status: DC
Start: 2015-11-03 — End: 2015-11-03

## 2015-11-03 MED ORDER — FENTANYL CITRATE (PF) 100 MCG/2ML IJ SOLN
50.0000 ug | INTRAMUSCULAR | Status: AC | PRN
  Administered 2015-11-03: 25 ug via INTRAVENOUS
  Administered 2015-11-03: 100 ug via INTRAVENOUS
  Administered 2015-11-03: 25 ug via INTRAVENOUS

## 2015-11-03 MED ORDER — 0.9 % SODIUM CHLORIDE (POUR BTL) OPTIME
TOPICAL | Status: DC | PRN
  Administered 2015-11-03: 300 mL

## 2015-11-03 MED ORDER — ACETAMINOPHEN 10 MG/ML IV SOLN
1000.0000 mg | Freq: Once | INTRAVENOUS | Status: AC
  Administered 2015-11-03: 1000 mg via INTRAVENOUS

## 2015-11-03 MED ORDER — FENTANYL CITRATE (PF) 100 MCG/2ML IJ SOLN
INTRAMUSCULAR | Status: AC
  Filled 2015-11-03: qty 2

## 2015-11-03 MED ORDER — LACTATED RINGERS IV SOLN
INTRAVENOUS | Status: DC
Start: 2015-11-03 — End: 2015-11-03
  Administered 2015-11-03 (×2): via INTRAVENOUS

## 2015-11-03 MED ORDER — OXYCODONE HCL 5 MG PO TABS
ORAL_TABLET | ORAL | Status: AC
  Filled 2015-11-03: qty 1

## 2015-11-03 MED ORDER — OXYCODONE HCL 5 MG PO TABS
5.0000 mg | ORAL_TABLET | Freq: Once | ORAL | Status: AC
Start: 2015-11-03 — End: 2015-11-03
  Administered 2015-11-03: 5 mg via ORAL

## 2015-11-03 MED ORDER — PROMETHAZINE HCL 25 MG/ML IJ SOLN: 6.2500 mg | INTRAMUSCULAR | Status: DC | PRN

## 2015-11-03 MED ORDER — DEXAMETHASONE SODIUM PHOSPHATE 10 MG/ML IJ SOLN: INTRAMUSCULAR | Status: DC | PRN

## 2015-11-03 MED ORDER — HYDROMORPHONE HCL 1 MG/ML IJ SOLN
0.5000 mg | INTRAMUSCULAR | Status: DC | PRN
  Administered 2015-11-03: 0.5 mg via INTRAVENOUS

## 2015-11-03 MED ORDER — MIDAZOLAM HCL 2 MG/2ML IJ SOLN
1.0000 mg | INTRAMUSCULAR | Status: DC | PRN
  Administered 2015-11-03: 1 mg via INTRAVENOUS
  Administered 2015-11-03: 2 mg via INTRAVENOUS

## 2015-11-03 MED ORDER — VANCOMYCIN HCL IN DEXTROSE 1-5 GM/200ML-% IV SOLN
1000.0000 mg | INTRAVENOUS | Status: AC
  Administered 2015-11-03: 1000 mg via INTRAVENOUS

## 2015-11-03 MED ORDER — FENTANYL CITRATE (PF) 100 MCG/2ML IJ SOLN
25.0000 ug | INTRAMUSCULAR | Status: DC | PRN
  Administered 2015-11-03 (×2): 25 ug via INTRAVENOUS
  Administered 2015-11-03: 50 ug via INTRAVENOUS

## 2015-11-03 MED ORDER — ACETAMINOPHEN 10 MG/ML IV SOLN
INTRAVENOUS | Status: AC
  Filled 2015-11-03: qty 100

## 2015-11-03 MED ORDER — SODIUM CHLORIDE 0.9 % IV SOLN: INTRAVENOUS | Status: DC

## 2015-11-03 MED ORDER — PROPOFOL 10 MG/ML IV BOLUS
INTRAVENOUS | Status: DC | PRN
  Administered 2015-11-03: 150 mg via INTRAVENOUS

## 2015-11-03 MED ORDER — LIDOCAINE HCL (CARDIAC) 20 MG/ML IV SOLN
INTRAVENOUS | Status: DC | PRN
  Administered 2015-11-03: 30 mg via INTRAVENOUS

## 2015-11-03 SURGICAL SUPPLY — 84 items
BANDAGE ESMARK 6X9 LF (GAUZE/BANDAGES/DRESSINGS) ×1 IMPLANT
BIT DRILL 2.5X2.75 QC CALB (BIT) ×2 IMPLANT
BIT DRILL 3.5X5.5 QC CALB (BIT) ×2 IMPLANT
BLADE SURG 15 STRL LF DISP TIS (BLADE) ×2 IMPLANT
BLADE SURG 15 STRL SS (BLADE) ×6
BNDG CMPR 9X4 STRL LF SNTH (GAUZE/BANDAGES/DRESSINGS)
BNDG CMPR 9X6 STRL LF SNTH (GAUZE/BANDAGES/DRESSINGS) ×1
BNDG COHESIVE 4X5 TAN STRL (GAUZE/BANDAGES/DRESSINGS) ×3 IMPLANT
BNDG COHESIVE 6X5 TAN STRL LF (GAUZE/BANDAGES/DRESSINGS) ×3 IMPLANT
BNDG ESMARK 4X9 LF (GAUZE/BANDAGES/DRESSINGS) IMPLANT
BNDG ESMARK 6X9 LF (GAUZE/BANDAGES/DRESSINGS) ×3
CANISTER SUCT 1200ML W/VALVE (MISCELLANEOUS) ×3 IMPLANT
CHLORAPREP W/TINT 26ML (MISCELLANEOUS) ×3 IMPLANT
COVER BACK TABLE 60X90IN (DRAPES) ×3 IMPLANT
CUFF TOURNIQUET SINGLE 34IN LL (TOURNIQUET CUFF) ×2 IMPLANT
DECANTER SPIKE VIAL GLASS SM (MISCELLANEOUS) IMPLANT
DRAPE EXTREMITY T 121X128X90 (DRAPE) ×3 IMPLANT
DRAPE OEC MINIVIEW 54X84 (DRAPES) ×3 IMPLANT
DRAPE U-SHAPE 47X51 STRL (DRAPES) ×3 IMPLANT
DRSG MEPITEL 4X7.2 (GAUZE/BANDAGES/DRESSINGS) ×3 IMPLANT
DRSG PAD ABDOMINAL 8X10 ST (GAUZE/BANDAGES/DRESSINGS) ×6 IMPLANT
ELECT REM PT RETURN 9FT ADLT (ELECTROSURGICAL) ×3
ELECTRODE REM PT RTRN 9FT ADLT (ELECTROSURGICAL) ×1 IMPLANT
GAUZE SPONGE 4X4 12PLY STRL (GAUZE/BANDAGES/DRESSINGS) ×3 IMPLANT
GLOVE BIO SURGEON STRL SZ7 (GLOVE) ×2 IMPLANT
GLOVE BIO SURGEON STRL SZ8 (GLOVE) ×3 IMPLANT
GLOVE BIOGEL PI IND STRL 7.0 (GLOVE) IMPLANT
GLOVE BIOGEL PI IND STRL 7.5 (GLOVE) IMPLANT
GLOVE BIOGEL PI IND STRL 8 (GLOVE) ×2 IMPLANT
GLOVE BIOGEL PI INDICATOR 7.0 (GLOVE) ×4
GLOVE BIOGEL PI INDICATOR 7.5 (GLOVE) ×2
GLOVE BIOGEL PI INDICATOR 8 (GLOVE) ×2
GLOVE ECLIPSE 6.5 STRL STRAW (GLOVE) ×2 IMPLANT
GLOVE ECLIPSE 7.5 STRL STRAW (GLOVE) ×1 IMPLANT
GLOVE EXAM NITRILE MD LF STRL (GLOVE) IMPLANT
GOWN STRL REUS W/ TWL LRG LVL3 (GOWN DISPOSABLE) ×1 IMPLANT
GOWN STRL REUS W/ TWL XL LVL3 (GOWN DISPOSABLE) ×2 IMPLANT
GOWN STRL REUS W/TWL LRG LVL3 (GOWN DISPOSABLE) ×6
GOWN STRL REUS W/TWL XL LVL3 (GOWN DISPOSABLE) ×3
K-WIRE ACE 1.6X6 (WIRE) ×6
KWIRE ACE 1.6X6 (WIRE) IMPLANT
NEEDLE HYPO 22GX1.5 SAFETY (NEEDLE) IMPLANT
NS IRRIG 1000ML POUR BTL (IV SOLUTION) ×3 IMPLANT
PACK BASIN DAY SURGERY FS (CUSTOM PROCEDURE TRAY) ×3 IMPLANT
PAD CAST 4YDX4 CTTN HI CHSV (CAST SUPPLIES) ×1 IMPLANT
PADDING CAST ABS 4INX4YD NS (CAST SUPPLIES)
PADDING CAST ABS COTTON 4X4 ST (CAST SUPPLIES) IMPLANT
PADDING CAST COTTON 4X4 STRL (CAST SUPPLIES) ×3
PADDING CAST COTTON 6X4 STRL (CAST SUPPLIES) ×3 IMPLANT
PENCIL BUTTON HOLSTER BLD 10FT (ELECTRODE) ×3 IMPLANT
PLATE ACE 100DEG 6HOLE (Plate) ×2 IMPLANT
SANITIZER HAND PURELL 535ML FO (MISCELLANEOUS) ×3 IMPLANT
SCREW ACE CAN 4.0 40M (Screw) ×4 IMPLANT
SCREW CORTICAL 3.5MM  16MM (Screw) ×4 IMPLANT
SCREW CORTICAL 3.5MM  20MM (Screw) ×2 IMPLANT
SCREW CORTICAL 3.5MM  28MM (Screw) ×2 IMPLANT
SCREW CORTICAL 3.5MM 14MM (Screw) ×2 IMPLANT
SCREW CORTICAL 3.5MM 16MM (Screw) IMPLANT
SCREW CORTICAL 3.5MM 20MM (Screw) IMPLANT
SCREW CORTICAL 3.5MM 28MM (Screw) IMPLANT
SCREW NLOCK CANC HEX 4X14 (Screw) ×2 IMPLANT
SCREW NLOCK CANC HEX 4X16 (Screw) ×4 IMPLANT
SHEET MEDIUM DRAPE 40X70 STRL (DRAPES) ×3 IMPLANT
SLEEVE SCD COMPRESS KNEE MED (MISCELLANEOUS) ×3 IMPLANT
SPLINT FAST PLASTER 5X30 (CAST SUPPLIES) ×40
SPLINT PLASTER CAST FAST 5X30 (CAST SUPPLIES) ×20 IMPLANT
SPONGE LAP 18X18 X RAY DECT (DISPOSABLE) ×3 IMPLANT
STOCKINETTE 6  STRL (DRAPES) ×2
STOCKINETTE 6 STRL (DRAPES) ×1 IMPLANT
SUCTION FRAZIER HANDLE 10FR (MISCELLANEOUS) ×2
SUCTION TUBE FRAZIER 10FR DISP (MISCELLANEOUS) ×1 IMPLANT
SUT ETHILON 3 0 PS 1 (SUTURE) ×3 IMPLANT
SUT FIBERWIRE #2 38 T-5 BLUE (SUTURE)
SUT MNCRL AB 3-0 PS2 18 (SUTURE) ×3 IMPLANT
SUT VIC AB 0 SH 27 (SUTURE) IMPLANT
SUT VIC AB 2-0 SH 27 (SUTURE)
SUT VIC AB 2-0 SH 27XBRD (SUTURE) IMPLANT
SUTURE FIBERWR #2 38 T-5 BLUE (SUTURE) IMPLANT
SYR BULB 3OZ (MISCELLANEOUS) ×3 IMPLANT
SYR CONTROL 10ML LL (SYRINGE) IMPLANT
TOWEL OR 17X24 6PK STRL BLUE (TOWEL DISPOSABLE) ×6 IMPLANT
TUBE CONNECTING 20'X1/4 (TUBING) ×1
TUBE CONNECTING 20X1/4 (TUBING) ×2 IMPLANT
UNDERPAD 30X30 (UNDERPADS AND DIAPERS) ×3 IMPLANT

## 2015-11-03 NOTE — Discharge Instructions (Signed)
Tonya Simmer, MD [place]  Please read the following information regarding your care after surgery.  Medications  You only need a prescription for the narcotic pain medicine (ex. oxycodone, Percocet, Norco).  All of the other medicines listed below are available over the counter. X acetominophen (Tylenol) 650 mg every 4-6 hours as you need for minor pain OR X Norco as prescribed for moderate to severe pain  Narcotic pain medicine (ex. oxycodone, Percocet, Vicodin) will cause constipation.  To prevent this problem, take the following medicines while you are taking any pain medicine. X docusate sodium (Colace) 100 mg twice a day X senna (Senokot) 2 tablets twice a day  X To help prevent blood clots, take a baby aspirin (81 mg) twice a day for two weeks after surgery.  You should also get up every hour while you are awake to move around.    Weight Bearing ? Bear weight when you are able on your operated leg or foot. ? Bear weight only on the heel of your operated foot in the post-op shoe. X Do not bear any weight on the operated leg or foot.  Cast / Splint / Dressing X Keep your splint or cast clean and dry.  Dont put anything (coat hanger, pencil, etc) down inside of it.  If it gets damp, use a hair dryer on the cool setting to dry it.  If it gets soaked, call the office to schedule an appointment for a cast change. ? Remove your dressing 3 days after surgery and cover the incisions with dry dressings.    After your dressing, cast or splint is removed; you may shower, but do not soak or scrub the wound.  Allow the water to run over it, and then gently pat it dry.  Swelling It is normal for you to have swelling where you had surgery.  To reduce swelling and pain, keep your toes above your nose for at least 3 days after surgery.  It may be necessary to keep your foot or leg elevated for several weeks.  If it hurts, it should be elevated.  Follow Up Call my office at  [phone] when you are discharged from the hospital or surgery center to schedule an appointment to be seen two weeks after surgery.  Call my office at [phone] if you develop a fever >101.5 F, nausea, vomiting, bleeding from the surgical site or severe pain.    Regional Anesthesia Blocks  1. Numbness or the inability to move the "blocked" extremity may last from 3-48 hours after placement. The length of time depends on the medication injected and your individual response to the medication. If the numbness is not going away after 48 hours, call your surgeon.  2. The extremity that is blocked will need to be protected until the numbness is gone and the  Strength has returned. Because you cannot feel it, you will need to take extra care to avoid injury. Because it may be weak, you may have difficulty moving it or using it. You may not know what position it is in without looking at it while the block is in effect.  3. For blocks in the legs and feet, returning to weight bearing and walking needs to be done carefully. You will need to wait until the numbness is entirely gone and the strength has returned. You should be able to move your leg and foot normally before you try and bear weight or walk. You will need someone to be with  you when you first try to ensure you do not fall and possibly risk injury.  4. Bruising and tenderness at the needle site are common side effects and will resolve in a few days.  5. Persistent numbness or new problems with movement should be communicated to the surgeon or the [facility] [phone] [place] [phone]).  Post Anesthesia Home Care Instructions  Activity: Get plenty of rest for the remainder of the day. A responsible adult should stay with you for 24 hours following the procedure.  For the next 24 hours, DO NOT: -Drive a car -Paediatric nurse -Drink alcoholic beverages -Take any medication unless instructed by  your physician -Make any legal decisions or sign important papers.  Meals: Start with liquid foods such as gelatin or soup. Progress to regular foods as tolerated. Avoid greasy, spicy, heavy foods. If nausea and/or vomiting occur, drink only clear liquids until the nausea and/or vomiting subsides. Call your physician if vomiting continues.  Special Instructions/Symptoms: Your throat may feel dry or sore from the anesthesia or the breathing tube placed in your throat during surgery. If this causes discomfort, gargle with warm salt water. The discomfort should disappear within 24 hours.  If you had a scopolamine patch placed behind your ear for the management of post- operative nausea and/or vomiting:  1. The medication in the patch is effective for 72 hours, after which it should be removed.  Wrap patch in a tissue and discard in the trash. Wash hands thoroughly with soap and water. 2. You may remove the patch earlier than 72 hours if you experience unpleasant side effects which may include dry mouth, dizziness or visual disturbances. 3. Avoid touching the patch. Wash your hands with soap and water after contact with the patch.

## 2015-11-03 NOTE — Anesthesia Procedure Notes (Addendum)
Anesthesia Regional Block:  Popliteal block  Pre-Anesthetic Checklist: ,, timeout performed, Correct Patient, Correct Site, Correct Laterality, Correct Procedure, Correct Position, site marked, Risks and benefits discussed,  Surgical consent,  Pre-op evaluation,  At surgeon's request and post-op pain management  Laterality: Left  Prep: chloraprep       Needles:  Injection technique: Single-shot  Needle Type: Echogenic Needle     Needle Length: 9cm 9 cm Needle Gauge: 21 and 21 G    Additional Needles:  Procedures: ultrasound guided (picture in chart) Popliteal block Narrative:  Start time: 11/03/2015 12:15 PM End time: 11/03/2015 12:18 PM Injection made incrementally with aspirations every 5 mL.  Performed by: Personally  Anesthesiologist: Suella Broad D  Additional Notes: Pt tolerated well. No immediate complications noted.    Anesthesia Regional Block:  Nerve block type: Saphenous Nerve Block.  Pre-Anesthetic Checklist: ,, timeout performed, Correct Patient, Correct Site, Correct Laterality, Correct Procedure, Correct Position, site marked, Risks and benefits discussed,  Surgical consent,  Pre-op evaluation,  At surgeon's request and post-op pain management  Laterality: Right  Prep: chloraprep       Needles:  Injection technique: Single-shot  Needle Type: Echogenic Needle     Needle Length: 9cm 9 cm Needle Gauge: 21 and 21 G    Additional Needles:  Procedures: ultrasound guided (picture in chart) Nerve block type: Saphenous Nerve Block. Narrative:  Start time: 11/03/2015 12:18 PM End time: 11/03/2015 12:20 PM Injection made incrementally with aspirations every 5 mL.  Performed by: Personally  Anesthesiologist: Suella Broad D  Additional Notes: No immediate complications noted.    Procedure Name: LMA Insertion Date/Time: 11/03/2015 1:47 PM Performed by: Alyssia Heese D Pre-anesthesia Checklist: Patient identified, Emergency Drugs available, Suction  available and Patient being monitored Patient Re-evaluated:Patient Re-evaluated prior to inductionOxygen Delivery Method: Circle System Utilized Preoxygenation: Pre-oxygenation with 100% oxygen Intubation Type: IV induction Ventilation: Mask ventilation without difficulty LMA: LMA inserted LMA Size: 4.0 Number of attempts: 1 Airway Equipment and Method: Bite block Placement Confirmation: positive ETCO2 Tube secured with: Tape Dental Injury: Teeth and Oropharynx as per pre-operative assessment

## 2015-11-03 NOTE — Anesthesia Postprocedure Evaluation (Signed)
Anesthesia Post Note  Patient: Tonya Wise  Procedure(s) Performed: Procedure(s) (LRB): OPEN REDUCTION INTERNAL FIXATION (ORIF) LEFT ANKLE FRACTURE (Left)  Patient location during evaluation: PACU Anesthesia Type: General and Regional Level of consciousness: awake and alert Pain management: pain level controlled Vital Signs Assessment: post-procedure vital signs reviewed and stable Respiratory status: spontaneous breathing, nonlabored ventilation, respiratory function stable and patient connected to nasal cannula oxygen Cardiovascular status: blood pressure returned to baseline and stable Postop Assessment: no signs of nausea or vomiting Anesthetic complications: no    Last Vitals:  Filed Vitals:   11/03/15 1715 11/03/15 1759  BP: 131/62 137/60  Pulse: 87 86  Temp:  37.1 C  Resp: 17 18    Last Pain:  Filed Vitals:   11/03/15 1800  PainSc: 0-No pain                 Effie Berkshire

## 2015-11-03 NOTE — H&P (Signed)
Tonya Wise is an 75 y.o. female.   Chief Complaint: left ankle pain HPI: 75 y/o female with left ankle trimalleolar fracture after a fall at home a week ago.  She presents today for ORIF of this unstable fracture.  Past Medical History  Diagnosis Date  . Hypertension   . Renal insufficiency   . Osteopenia   . Allergy   . Heart murmur   . Anemia   . Hypercalcemia 03/27/2013  . Diastolic dysfunction, grade I 04/12/2013  . Non-Hodgkins lymphoma ([place]) 04/06/2013  . GERD (gastroesophageal reflux disease)     Tums or Maalox  . Ankle fracture, left     Past Surgical History  Procedure Laterality Date  . Appendectomy    . Colonoscopy  2008  . Spine surgery    . Back surgery      LOWER BACK TWICE  . Abdominal hysterectomy    . Esophagogastroduodenoscopy N/A 03/29/2013    Procedure: ESOPHAGOGASTRODUODENOSCOPY (EGD);  Surgeon: Juanita Craver, MD;  Location: [facility] ENDOSCOPY;  Service: Endoscopy;  Laterality: N/A;  . Inguinal lymph node biopsy Right 03/31/2013    Procedure: INGUINAL LYMPH NODE BIOPSY;  Surgeon: Gwenyth Ober, MD;  Location: [facility];  Service: General;  Laterality: Right;    Family History  Problem Relation Age of Onset  . Heart attack Father 37  . Leukemia Mother   . Diabetes Brother   . Diabetes Sister    Social History:  reports that she quit smoking about 55 years ago. Her smoking use included Cigarettes. She has a 1 pack-year smoking history. She has never used smokeless tobacco. She reports that she does not drink alcohol or use illicit drugs.  Allergies:  Allergies  Allergen Reactions  . Prednisone Other (See Comments)    "like being in a coma"  . Penicillins Itching, Rash and Other (See Comments)    "burning sensation" Has patient had a PCN reaction causing immediate rash, facial/tongue/throat swelling, SOB or lightheadedness with hypotension: No Has patient had a PCN reaction causing severe rash involving mucus membranes or skin necrosis: No Has patient had a PCN  reaction that required hospitalization No Has patient had a PCN reaction occurring within the last 10 years: No If all of the above answers are "NO", then may proceed with Cephalosporin use.    Medications Prior to Admission  Medication Sig Dispense Refill  . HYDROcodone-acetaminophen (NORCO/VICODIN) 5-325 MG tablet Take 1 tablet by mouth every 6 (six) hours as needed. 20 tablet 0  . losartan-hydrochlorothiazide (HYZAAR) 100-12.5 MG tablet Take 1 tablet by mouth daily. 30 tablet 3  . metoprolol (LOPRESSOR) 50 MG tablet Take 1 tablet (50 mg total) by mouth 2 (two) times daily. 60 tablet 3  . Misc Natural Products (OSTEO BI-FLEX TRIPLE STRENGTH) TABS Take 1 tablet by mouth 2 (two) times daily.    . vitamin B-12 (CYANOCOBALAMIN) 500 MCG tablet Take 500 mcg by mouth daily.      Results for orders placed or performed during the hospital encounter of 11/03/15 (from the past 48 hour(s))  I-STAT, chem 8     Status: Abnormal   Collection Time: 11/03/15 11:37 AM  Result Value Ref Range   Sodium 140 135 - 145 mmol/L   Potassium 4.0 3.5 - 5.1 mmol/L   Chloride 101 101 - 111 mmol/L   BUN 21 (H) 6 - 20 mg/dL   Creatinine, Ser 1.10 (H) 0.44 - 1.00 mg/dL   Glucose, Bld 103 (H) 65 - 99 mg/dL  Calcium, Ion 1.15 1.13 - 1.30 mmol/L   TCO2 29 0 - 100 mmol/L   Hemoglobin 12.2 12.0 - 15.0 g/dL   HCT 36.0 36.0 - 46.0 %   No results found.  ROS  No recent f/c/n/v/wt loss  Blood pressure 126/61, pulse 79, temperature 98.2 F (36.8 C), temperature source Oral, resp. rate 18, height 5\' 3"  (1.6 m), weight 62.596 kg (138 lb), SpO2 100 %. Physical Exam  [age] wd female in nad.  A and O x 4.  Mood and affect normal.  EOMI.  resp unlabored.  L ankle with moderate swelling and mild ecchymosis medially and laterally.  Skin healthy and intact.  No lymphadenopathy.  5/5 strength in PF and DF.  sens to LT intact at the foot dorsally and plantarly.  Assessment/Plan L ankle trimal fracture - to OR for ORIF. The risks  and benefits of the alternative treatment options have been discussed in detail.  The patient wishes to proceed with surgery and specifically understands risks of bleeding, infection, nerve damage, blood clots, need for additional surgery, amputation and death.   Wylene Simmer, MD 12-03-15, 1:23 PM

## 2015-11-03 NOTE — Transfer of Care (Signed)
Immediate Anesthesia Transfer of Care Note  Patient: Tonya Wise  Procedure(s) Performed: Procedure(s): OPEN REDUCTION INTERNAL FIXATION (ORIF) LEFT ANKLE FRACTURE (Left)  Patient Location: PACU  Anesthesia Type:General  Level of Consciousness: sedated  Airway & Oxygen Therapy: Patient Spontanous Breathing and Patient connected to face mask oxygen  Post-op Assessment: Report given to RN and Post -op Vital signs reviewed and stable  Post vital signs: Reviewed and stable  Last Vitals:  Filed Vitals:   11/03/15 1449 11/03/15 1451  BP: 142/77   Pulse:  88  Temp:    Resp:  14    Complications: No apparent anesthesia complications

## 2015-11-03 NOTE — Op Note (Signed)
NAME:  Tonya Wise, Tonya Wise NO.:  [id number]  MEDICAL RECORD NO.:  [id number]  LOCATION:                                 FACILITY:  PHYSICIAN:  Wylene Simmer, MD        DATE OF BIRTH:  13-Jul-1941  DATE OF PROCEDURE:  11/03/2015 DATE OF DISCHARGE:                              OPERATIVE REPORT   PREOPERATIVE DIAGNOSIS:  Left ankle trimalleolar fracture.  POSTOPERATIVE DIAGNOSIS:  Left ankle trimalleolar fracture.  PROCEDURE: 1. Open reduction and internal fixation of left ankle trimalleolar     fracture without fixation of the posterior lip. 2. AP, mortise, and lateral radiographs of the left ankle.  SURGEON:  Wylene Simmer, MD.  ANESTHESIA:  General, regional.  ESTIMATED BLOOD LOSS:  Minimal.  TOURNIQUET TIME:  32 minutes at 250 mmHg.  COMPLICATIONS:  None apparent.  DISPOSITION:  Extubated, awake, and stable to recovery.  INDICATION FOR PROCEDURE:  The patient is a 75 year old woman without significant past medical history.  She had an injury at home last week and fractured her left ankle.  Radiographs show a trimalleolar fracture that is displaced.  She presents now for operative treatment of this unstable and displaced ankle fracture.  She understands the risks and benefits, the alternative treatment options, and elects surgical treatment.  She specifically understands risks of bleeding, infection, nerve damage, blood clots, need for additional surgery, continued pain, nonunion, amputation, and death.  PROCEDURE IN DETAIL:  After preoperative consent was obtained and the correct operative site was identified, the patient was brought to the operating room and placed supine on the operating table.  General anesthesia was induced.  Preoperative antibiotics were administered. Surgical time-out was taken.  Left lower extremity was prepped and draped in standard sterile fashion with tourniquet around the thigh. The extremity was exsanguinated, and the  tourniquet was inflated to 250 mmHg.  A longitudinal incision was made over the lateral malleolus, and sharp dissection was carried down through skin and subcutaneous tissues. Fracture site was identified.  It was cleaned of all hematoma and irrigated copiously.  The fracture was reduced and held with a tenaculum.  There was comminution anteriorly, and this fragment was also reduced and held with a tenaculum.  Two 3.5-mm fully-threaded lag screws were inserted from anterior to posterior across the fracture sites. Neither had particularly sturdy purchase.  However, they did hold the fracture in a reduced position.  A 6-hole one-third tubular plate was then contoured to fit the lateral malleolus.  It was secured distally with 3 unicortical screws and proximally with 3 bicortical screws.  These screws were noted to have adequate purchase.  AP and lateral radiographs confirmed appropriate reduction of the lateral malleolus fracture.  Attention was then turned to the medial malleolus.  The fracture was reduced and K-wires were inserted across the fracture site.  The appropriate alignment was confirmed on AP and lateral radiographs.  A 4 mm x 40 mm partially threaded cannulated screws were then inserted through stab incisions.  Both were noted to have excellent purchase and compressed the fracture site appropriately.  Final AP, mortise, and lateral radiographs confirmed appropriate position and length of all  hardware and appropriate reduction of the fracture sites.  The posterior malleolus fracture was appropriately reduced and was small enough to not require additional fixation.  Both wounds were irrigated copiously.  The lateral incision was closed with 2-0 Vicryl inverted simple sutures and horizontal mattress sutures of 3-0 nylon at the level of the skin. Medial incisions were closed with horizontal mattress sutures of 3-0 nylon.  Sterile dressings were applied followed by a well-padded  short- leg splint.  Tourniquet was released after application of the dressings at 32 minutes.  The patient was awakened from anesthesia and transported to the recovery room in stable condition.  FOLLOWUP PLAN:  The patient will be nonweightbearing on the left lower extremity.  She will follow up with me in the office in 2 weeks for suture removal and conversion to a short-leg cast.  She will need to be in the cast and nonweightbearing for a month.  RADIOGRAPHS:  AP, mortise, and lateral radiographs of the left ankle were obtained intraoperatively.  These show interval reduction and fixation of medial and lateral malleolus fractures.  Ankle fractures are appropriately reduced and hardware is of the appropriate length and appropriately positioned.  No other acute injuries are noted.     Wylene Simmer, MD     JH/MEDQ  D:  11/03/2015  T:  11/03/2015  Job:  [id number]

## 2015-11-03 NOTE — Progress Notes (Signed)
Assisted Dr. Hollis with left, ultrasound guided, popliteal/saphenous block. Side rails up, monitors on throughout procedure. See vital signs in flow sheet. Tolerated Procedure well. 

## 2015-11-03 NOTE — Brief Op Note (Signed)
11/03/2015  2:58 PM  PATIENT:  BREIGHLYN LATTANZIO  75 y.o. female  PRE-OPERATIVE DIAGNOSIS:  left ankle trimalleolar fracture  POST-OPERATIVE DIAGNOSIS:  left ankle trimalleolar fracture  Procedure(s): 1.  ORIF left ankle trimal fracture without fixation of the posterior lip   2.  AP, mortise and lateral xrays of the left ankle  SURGEON:  Wylene Simmer, MD  ASSISTANT: n/a  ANESTHESIA:   General, regional  EBL:  minimal   TOURNIQUET:   Total Tourniquet Time Documented: Thigh (Left) - 32 minutes Total: Thigh (Left) - 32 minutes  COMPLICATIONS:  None apparent  DISPOSITION:  Extubated, awake and stable to recovery.  DICTATION ID:  [id number]

## 2015-11-03 NOTE — Progress Notes (Signed)
Dr. Smith Robert in to administer block to pt.  Who is not getting any pain relief from previous block and various analgesics in PACU.   Time out done and pt on monitor.  VSS.  Pt. Tolerated procedure well and is resting.

## 2015-11-03 NOTE — Anesthesia Preprocedure Evaluation (Addendum)
Anesthesia Evaluation  Patient identified by MRN, date of birth, ID band Patient awake    Reviewed: Allergy & Precautions, NPO status , Patient's Chart, lab work & pertinent test results, reviewed documented beta blocker date and time   Airway Mallampati: II  TM Distance: >3 FB Neck ROM: Full    Dental  (+) Teeth Intact   Pulmonary former smoker,    breath sounds clear to auscultation       Cardiovascular hypertension, Pt. on medications and Pt. on home beta blockers +CHF  + Valvular Problems/Murmurs  Rhythm:Regular Rate:Normal     Neuro/Psych negative neurological ROS  negative psych ROS   GI/Hepatic Neg liver ROS, GERD  ,  Endo/Other  negative endocrine ROS  Renal/GU Renal InsufficiencyRenal disease  negative genitourinary   Musculoskeletal negative musculoskeletal ROS (+)   Abdominal Normal abdominal exam  (+)   Peds negative pediatric ROS (+)  Hematology   Anesthesia Other Findings - Non-Hodgkins lymphoma  Reproductive/Obstetrics negative OB ROS                            Lab Results  Component Value Date   WBC 4.8 06/09/2015   HGB 13.5 06/09/2015   HCT 41.2 06/09/2015   MCV 84.8 06/09/2015   PLT 125* 06/09/2015   Lab Results  Component Value Date   INR 1.10 07/02/2014   INR 1.05 06/08/2014   INR 2.60 12/04/2013   PROTIME 31.2* 12/04/2013   PROTIME 25.2* 11/12/2013   PROTIME 25.2* 11/05/2013   11/2014 EKG: sinus bradycardia.   Anesthesia Physical Anesthesia Plan  ASA: III  Anesthesia Plan: General   Post-op Pain Management: GA combined w/ Regional for post-op pain   Induction: Intravenous  Airway Management Planned: LMA  Additional Equipment:   Intra-op Plan:   Post-operative Plan: Extubation in OR  Informed Consent: I have reviewed the patients History and Physical, chart, labs and discussed the procedure including the risks, benefits and alternatives for  the proposed anesthesia with the patient or authorized representative who has indicated his/her understanding and acceptance.   Dental advisory given  Plan Discussed with: CRNA  Anesthesia Plan Comments:         Anesthesia Quick Evaluation

## 2015-11-04 ENCOUNTER — Encounter (HOSPITAL_BASED_OUTPATIENT_CLINIC_OR_DEPARTMENT_OTHER): Payer: Self-pay | Admitting: Orthopedic Surgery

## 2015-11-10 NOTE — Progress Notes (Signed)
Pt said she is doing okay ankle is hurting but other than that she is doing good and Thank You for checking on her

## 2015-11-10 NOTE — Progress Notes (Signed)
See how she is doing 

## 2015-11-17 DIAGNOSIS — Z4789 Encounter for other orthopedic aftercare: Secondary | ICD-10-CM | POA: Diagnosis not present

## 2015-11-17 DIAGNOSIS — S82852D Displaced trimalleolar fracture of left lower leg, subsequent encounter for closed fracture with routine healing: Secondary | ICD-10-CM | POA: Diagnosis not present

## 2015-11-23 ENCOUNTER — Telehealth: Payer: Self-pay | Admitting: Internal Medicine

## 2015-11-23 ENCOUNTER — Ambulatory Visit (HOSPITAL_COMMUNITY)
Admission: RE | Admit: 2015-11-23 | Discharge: 2015-11-23 | Disposition: A | Discharge status: Home / Self Care | Payer: Medicare Other | Source: Ambulatory Visit | Attending: Family Medicine | Admitting: Family Medicine

## 2015-11-23 ENCOUNTER — Encounter: Payer: Self-pay | Admitting: Family Medicine

## 2015-11-23 ENCOUNTER — Ambulatory Visit (INDEPENDENT_AMBULATORY_CARE_PROVIDER_SITE_OTHER): Payer: Medicare Other | Admitting: Family Medicine

## 2015-11-23 ENCOUNTER — Ambulatory Visit
Admission: RE | Admit: 2015-11-23 | Discharge: 2015-11-23 | Disposition: A | Discharge status: Home / Self Care | Payer: Medicare Other | Source: Ambulatory Visit | Attending: Family Medicine | Admitting: Family Medicine

## 2015-11-23 VITALS — BP 146/86 | HR 120 | Temp 102.7°F | Resp 24

## 2015-11-23 DIAGNOSIS — R509 Fever, unspecified: Secondary | ICD-10-CM

## 2015-11-23 DIAGNOSIS — R0682 Tachypnea, not elsewhere classified: Secondary | ICD-10-CM

## 2015-11-23 DIAGNOSIS — N3001 Acute cystitis with hematuria: Secondary | ICD-10-CM | POA: Diagnosis not present

## 2015-11-23 DIAGNOSIS — R791 Abnormal coagulation profile: Secondary | ICD-10-CM

## 2015-11-23 DIAGNOSIS — R112 Nausea with vomiting, unspecified: Secondary | ICD-10-CM | POA: Diagnosis not present

## 2015-11-23 DIAGNOSIS — R0602 Shortness of breath: Secondary | ICD-10-CM | POA: Diagnosis not present

## 2015-11-23 DIAGNOSIS — R7989 Other specified abnormal findings of blood chemistry: Secondary | ICD-10-CM

## 2015-11-23 DIAGNOSIS — N3000 Acute cystitis without hematuria: Secondary | ICD-10-CM | POA: Diagnosis not present

## 2015-11-23 DIAGNOSIS — R05 Cough: Secondary | ICD-10-CM | POA: Diagnosis not present

## 2015-11-23 LAB — POCT URINALYSIS DIPSTICK
BILIRUBIN UA: NEGATIVE
GLUCOSE UA: NEGATIVE
Spec Grav, UA: >=1.03
Urobilinogen, UA: NEGATIVE
pH, UA: 6

## 2015-11-23 LAB — BASIC METABOLIC PANEL
BUN: 21 mg/dL (ref 7–25)
CHLORIDE: 99 mmol/L (ref 98–110)
CO2: 23 mmol/L (ref 20–31)
Calcium: 8.7 mg/dL (ref 8.6–10.4)
Creat: 1.3 mg/dL — ABNORMAL HIGH (ref 0.60–0.93)
GLUCOSE: 135 mg/dL — AB (ref 65–99)
POTASSIUM: 4.2 mmol/L (ref 3.5–5.3)
Sodium: 134 mmol/L — ABNORMAL LOW (ref 135–146)

## 2015-11-23 LAB — CBC WITH DIFFERENTIAL/PLATELET
BASOS ABS: 0 {cells}/uL (ref 0–200)
Basophils Relative: 0 %
EOS ABS: 0 {cells}/uL — AB (ref 15–500)
Eosinophils Relative: 0 %
HCT: 34.9 % — ABNORMAL LOW (ref 35.0–45.0)
Hemoglobin: 12.1 g/dL (ref 11.7–15.5)
Lymphocytes Relative: 10 %
Lymphs Abs: 650 cells/uL — ABNORMAL LOW (ref 850–3900)
MCH: 29.4 pg (ref 27.0–33.0)
MCHC: 34.7 g/dL (ref 32.0–36.0)
MCV: 84.7 fL (ref 80.0–100.0)
MONOS PCT: 11 %
MPV: 10 fL (ref 7.5–12.5)
Monocytes Absolute: 715 cells/uL (ref 200–950)
NEUTROS ABS: 5135 {cells}/uL (ref 1500–7800)
NEUTROS PCT: 79 %
PLATELETS: 98 10*3/uL — AB (ref 140–400)
RBC: 4.12 MIL/uL (ref 3.80–5.10)
RDW: 13.7 % (ref 11.0–15.0)
WBC: 6.5 10*3/uL (ref 4.0–10.5)

## 2015-11-23 LAB — POC INFLUENZA A&B (BINAX/QUICKVUE)
Influenza A, POC: NEGATIVE
Influenza B, POC: NEGATIVE

## 2015-11-23 LAB — D-DIMER, QUANTITATIVE: D-Dimer, Quant: 1.48 ug/mL-FEU — ABNORMAL HIGH (ref 0.00–0.48)

## 2015-11-23 MED ORDER — CIPROFLOXACIN HCL 500 MG PO TABS: 500.0000 mg | ORAL_TABLET | Freq: Two times a day (BID) | ORAL | Status: DC

## 2015-11-23 MED ORDER — ONDANSETRON HCL 4 MG PO TABS: 4.0000 mg | ORAL_TABLET | Freq: Three times a day (TID) | ORAL | Status: DC | PRN

## 2015-11-23 MED ORDER — IOPAMIDOL (ISOVUE-370) INJECTION 76%
100.0000 mL | Freq: Once | INTRAVENOUS | Status: AC | PRN
  Administered 2015-11-23: 100 mL via INTRAVENOUS

## 2015-11-23 NOTE — Telephone Encounter (Signed)
Sent med to pharmacy  

## 2015-11-23 NOTE — Progress Notes (Signed)
Subjective:    Patient ID: Tonya Wise, female    DOB: 03/15/1941, 75 y.o.   MRN: [id number]  HPI Chief Complaint  Patient presents with  . sick    sick- fever 101.1, vomitting, chills,    She is here with a 3 day history of fever, chills, vomiting, shortness of breath, palpitations, mild cough, severe left lower leg pain which has a cast on it due to recent surgery. States shortness of breath has been going on a little over a week and unchanged.   She is status post surgery to left ankle on April 14th by Dr. Doran Durand. Had a follow up visit with surgeon last Thursday and no sign of infection.  She does report history of pneumonia and PE in 2014. She has has a history of cancer.   Denies chest pain, sore throat, nasal congestion, dizziness, abdominal pain, diarrhea, urinary frequency, urgency or dysuria.     Past Medical History  Diagnosis Date  . Hypertension   . Renal insufficiency   . Osteopenia   . Allergy   . Heart murmur   . Anemia   . Hypercalcemia 03/27/2013  . Diastolic dysfunction, grade I 04/12/2013  . Non-Hodgkins lymphoma ([place]) 04/06/2013  . GERD (gastroesophageal reflux disease)     Tums or Maalox  . Ankle fracture, left      Review of Systems Pertinent positives and negatives in the history of present illness.     Objective:   Physical Exam  Constitutional: She is oriented to person, place, and time. She appears well-developed and well-nourished. She has a sickly appearance.  HENT:  Mouth/Throat: Uvula is midline. Mucous membranes are dry.  Neck: Normal range of motion and full passive range of motion without pain. Neck supple.  Cardiovascular: Regular rhythm and intact distal pulses.  Tachycardia present.   Pulmonary/Chest: Breath sounds normal. Tachypnea noted. No respiratory distress.  Neurological: She is oriented to person, place, and time. No cranial nerve deficit or sensory deficit.  Skin: Skin is warm. No rash noted. No cyanosis. No pallor.    Psychiatric: She has a normal mood and affect. Her speech is normal and behavior is normal. Judgment and thought content normal. Cognition and memory are normal.   Negative homan's  BP 146/86 mmHg  Pulse 120  Temp(Src) 102.7 F (39.3 C) (Tympanic)  Resp 24  SpO2 98%  Urine dipstick: spec grav 1.030, leuk 1+, nitrites trace, blood 2+, Ketone 2+     Assessment & Plan:  Fever, unspecified - Plan: CBC with Differential/Platelet, DG Chest 2 View, POCT urinalysis dipstick, Basic metabolic panel, Urine culture, POC Influenza A&B(BINAX/QUICKVUE)  Non-intractable vomiting with nausea, unspecified vomiting type - Plan: ondansetron (ZOFRAN) 4 MG tablet  Shortness of breath - Plan: D-dimer, quantitative (not at [facility]), DG Chest 2 View, CT Angio Chest W/Cm &/Or Wo Cm, CANCELED: CT Angio Chest W/Cm &/Or Wo Cm  Acute cystitis with hematuria - Plan: Urine culture  Positive D dimer - Plan: CT Angio Chest W/Cm &/Or Wo Cm, CANCELED: CT Angio Chest W/Cm &/Or Wo Cm  Tachypnea - Plan: CT Angio Chest W/Cm &/Or Wo Cm, CANCELED: CT Angio Chest W/Cm &/Or Wo Cm  Discussed patient with Dr. Redmond School.  Recommend that she take Zofran and see if she can quite down her stomach and start drinking fluids. Zofran sent to pharmacy. Discussed that if she cannot keep down fluids or is not urinating a normal amount that she would need to go to the ED for IV  hydration. Suspect that she is tachycardic due to some dehydration and fever. She did test positive for UTI which could be source of her fever. Will treat with Cipro. Discussed list of differentials with Dr. Redmond School and PE is a possibility, will order D-dimer and send for CTA of chest if positive. Also suspect possible pneumonia and will send now for chest XR and will treat accordingly.  Will follow up later today with lab results and XR result.

## 2015-11-23 NOTE — Patient Instructions (Signed)
Take Tylenol for fever. Pick up Zofran and take it for nausea and try to drink fluids. If you are unable to keep down fluids you will need to go to the ED for IV hydration.  We will call with chest XR and Lab results.  We will call in an antibiotic to your pharmacy after getting XR result.

## 2015-11-24 ENCOUNTER — Telehealth: Payer: Self-pay | Admitting: *Deleted

## 2015-11-24 NOTE — Telephone Encounter (Signed)
Patient called with a progress report. States that the bottom line is that she is not doing any better. She understands that she has only been on abx x 1 day and she is hopeful that she will fell better soon. She is just really tired of feeling tired. Still having dry heaves and temp is going up and dowmn-taking tylenol. Highest temp was 99.5 over the last 23 hours. Please advise.

## 2015-11-24 NOTE — Telephone Encounter (Signed)
I called and spoke with patient. She states she is feeling a little better, temperature has not been higher than 99.5, she is drinking plenty of fluids, has voided 4 times today, urine not as dark as yesterday. Taking Cipro for UTI. Taking Zofran as needed.  States she does not feel like heart is beating fast or short of breath like yesterday. Does not want to come back in to office. I recommended and strongly encouraged her to go to the ED if she develops high fever again, has chest pain, shortness of breath or cannot keep fluids down. She agreed to this plan.

## 2015-11-26 LAB — URINE CULTURE

## 2015-12-14 ENCOUNTER — Other Ambulatory Visit (HOSPITAL_BASED_OUTPATIENT_CLINIC_OR_DEPARTMENT_OTHER): Payer: Medicare Other

## 2015-12-14 DIAGNOSIS — D696 Thrombocytopenia, unspecified: Secondary | ICD-10-CM

## 2015-12-14 DIAGNOSIS — C8203 Follicular lymphoma grade I, intra-abdominal lymph nodes: Secondary | ICD-10-CM | POA: Diagnosis present

## 2015-12-14 LAB — CBC WITH DIFFERENTIAL/PLATELET
BASO%: 1.1 % (ref 0.0–2.0)
Basophils Absolute: 0 10*3/uL (ref 0.0–0.1)
EOS%: 1.3 % (ref 0.0–7.0)
Eosinophils Absolute: 0.1 10*3/uL (ref 0.0–0.5)
HEMATOCRIT: 38.6 % (ref 34.8–46.6)
HEMOGLOBIN: 12.5 g/dL (ref 11.6–15.9)
LYMPH#: 1.4 10*3/uL (ref 0.9–3.3)
LYMPH%: 33.8 % (ref 14.0–49.7)
MCH: 27.8 pg (ref 25.1–34.0)
MCHC: 32.4 g/dL (ref 31.5–36.0)
MCV: 85.9 fL (ref 79.5–101.0)
MONO#: 0.4 10*3/uL (ref 0.1–0.9)
MONO%: 10.1 % (ref 0.0–14.0)
NEUT#: 2.3 10*3/uL (ref 1.5–6.5)
NEUT%: 53.7 % (ref 38.4–76.8)
Platelets: 120 10*3/uL — ABNORMAL LOW (ref 145–400)
RBC: 4.5 10*6/uL (ref 3.70–5.45)
RDW: 14.5 % (ref 11.2–14.5)
WBC: 4.3 10*3/uL (ref 3.9–10.3)

## 2015-12-14 LAB — COMPREHENSIVE METABOLIC PANEL
ALT: 9 U/L (ref 0–55)
ANION GAP: 6 meq/L (ref 3–11)
AST: 12 U/L (ref 5–34)
Albumin: 3.6 g/dL (ref 3.5–5.0)
Alkaline Phosphatase: 59 U/L (ref 40–150)
BILIRUBIN TOTAL: 0.51 mg/dL (ref 0.20–1.20)
BUN: 17.4 mg/dL (ref 7.0–26.0)
CO2: 28 meq/L (ref 22–29)
Calcium: 9.8 mg/dL (ref 8.4–10.4)
Chloride: 109 mEq/L (ref 98–109)
Creatinine: 1.2 mg/dL — ABNORMAL HIGH (ref 0.6–1.1)
EGFR: 44 mL/min/{1.73_m2} — AB (ref >90)
Glucose: 103 mg/dl (ref 70–140)
POTASSIUM: 4.6 meq/L (ref 3.5–5.1)
Sodium: 143 mEq/L (ref 136–145)
TOTAL PROTEIN: 6.1 g/dL — AB (ref 6.4–8.3)

## 2015-12-14 LAB — LACTATE DEHYDROGENASE: LDH: 165 U/L (ref 125–245)

## 2015-12-16 DIAGNOSIS — Z4789 Encounter for other orthopedic aftercare: Secondary | ICD-10-CM | POA: Diagnosis not present

## 2015-12-16 DIAGNOSIS — S82852D Displaced trimalleolar fracture of left lower leg, subsequent encounter for closed fracture with routine healing: Secondary | ICD-10-CM | POA: Diagnosis not present

## 2015-12-21 ENCOUNTER — Ambulatory Visit (HOSPITAL_BASED_OUTPATIENT_CLINIC_OR_DEPARTMENT_OTHER): Payer: Medicare Other | Admitting: Internal Medicine

## 2015-12-21 ENCOUNTER — Encounter: Payer: Self-pay | Admitting: Internal Medicine

## 2015-12-21 ENCOUNTER — Telehealth: Payer: Self-pay | Admitting: Internal Medicine

## 2015-12-21 VITALS — BP 168/78 | HR 62 | Temp 97.8°F | Resp 18 | Ht 63.0 in | Wt 141.9 lb

## 2015-12-21 DIAGNOSIS — Z8572 Personal history of non-Hodgkin lymphomas: Secondary | ICD-10-CM | POA: Insufficient documentation

## 2015-12-21 DIAGNOSIS — C8203 Follicular lymphoma grade I, intra-abdominal lymph nodes: Secondary | ICD-10-CM | POA: Diagnosis not present

## 2015-12-21 DIAGNOSIS — D696 Thrombocytopenia, unspecified: Secondary | ICD-10-CM

## 2015-12-21 DIAGNOSIS — C8333 Diffuse large B-cell lymphoma, intra-abdominal lymph nodes: Secondary | ICD-10-CM

## 2015-12-21 NOTE — Telephone Encounter (Signed)
Gave and printed appt sched and avs fo rpt; for NOV  °

## 2015-12-21 NOTE — Progress Notes (Signed)
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  PRIOR THERAPY:   Non-Hodgkins lymphoma ([place])   03/27/2013 - 03/31/2013 Hospital Admission Admitted to [facility] for profound anemia (Hgb 6.4).  C/o fevers, drenching nightsweats, weight lost.  Negative colonoscopy and EGD.  CT of C/A/P demonstrated significant lymphadenopathy suspcious of ympha.     03/31/2013 Initial Diagnosis Biopsy of R inguinal lymph node revealed Non-Hodgkins lymphoma. Diffuse Large B-cell Lymphoma   04/10/2013 - 04/24/2013 Hospital Admission Hypercalcemia, renal failure and dehydration.  Started min-RCHOP.    04/15/2013 Bone Marrow Biopsy Involvement with Diffuse Large B-cell Lymphoma. Stage IV NHL w B-symptoms.    04/18/2013 - 04/18/2013 Chemotherapy Mini R-CHOP Cycle #1 on 04/18/2013. Dose reduced to poor nutrion. It consisted on Cytoxan 400 mg/m2,Doxorubicin 25 mg/m2, Rituximab 375 mg/m2, Vincristine '1mg'$ .  Prednisone 100 mg daily for 9/13 - 9/16.   Received neupogen on 9/21, 9/27 -9/30.     04/25/2013 - 04/29/2013 Hospital Admission Admitted due to worsening mouth sores/oral thrush, odynophagia, failure to thrive.  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  Discharge to [facility] (Dr. Gwynneth Munson) and discharged to home on 05/19/2013.     06/01/2013 - 06/01/2013 Chemotherapy Mini R-CHO #2 dosed as above.  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  Received neulasta shot on 06/02/13   06/16/2013 Cancer Staging Re-staging PET/CT demonstrated significant interval reduction in axillary lymphadenopathy.  There was 1 small residual right axillary lymph node measuring 16 x 12 mm which showed FDG uptake.  Signifcant reduction in mediastinal and hilar lymphadenopathy.     06/16/2013 Imaging CT of chest noted left-sided pulmonary embolim. Started on lovenox to coumadin bridge.     06/22/2013 - 06/22/2013 Chemotherapy Mini-R-CHO #3.  Received Neulasta on 06/23/2013.     07/16/2013 Imaging 2-D Echocardiogram demonstrates 35-45% EF with Grade 2 diastolic dysfunction.  Doxorubicin discontinued with consultation by cardiology (Dr. Claiborne Billings).     08/19/2013 - 08/21/2013 Chemotherapy Mini-R-CEO #4.  Etoposide 25 mg/m2 instead of doxo due to above.  Etoposide '50mg'$  bid on day 2/3.  On day 3 (08/21/2013) she received 1 of two oral doses of ectoposide.    08/21/2013 - 08/26/2013 Hospital Admission Admitted to [facility] due to acute respiratory failure and had influenzae A and pneumonia. Completed tamiflu and oral antibiotics.    09/25/2013 - 09/27/2013 Chemotherapy Mini-R-CEO #5. Etoposide 25 mg/m2 25 mg bid on days 2/3.    10/16/2013 - 10/19/2013 Chemotherapy Mini-R-CEO # 6. Etoposide 25 mg/m2 bid on days 2/3. Neulasta on day#4.    11/05/2013 Imaging Interval resolution of the hypermetabolism identified within the enlarged right axillary lymph node seen previously. This lymph node has also decreased markedly in size in the interval. No new hypermetabolic lesions in the neck, chest, abdomen, or  pelvis.    11/19/2013 Bone Marrow Biopsy There is no evidence of a B-cell lymphoproliferative process in this material. Normal cytogenetics.    04/16/2014 Imaging CT scan Chest, abdomen: No evidence of recurrent lymphoma within the chest or abdomen.   Significant decrease in splenomegaly since prior exam.     CURRENT THERAPY: Observation.  INTERVAL HISTORY: Tonya Wise 75 y.o. female returns to the clinic today for follow-up visit accompanied by her husband. The patient is feeling fine today with no specific complaints. She  had a fall recently and fracture left ankle. She is currently on observation. She denied having any significant chest pain, shortness breath, cough or hemoptysis. The patient  denied having any nausea or vomiting. She denied having any significant weight loss or night sweats. She had repeat lab work performed recently and she is here for evaluation and discussion of her lab results.  MEDICAL HISTORY: Past Medical History  Diagnosis Date  . Hypertension   . Renal insufficiency   . Osteopenia   . Allergy   . Heart murmur   . Anemia   . Hypercalcemia 03/27/2013  . Diastolic dysfunction, grade I 1/57/2620  . Non-Hodgkins lymphoma ([facility]) 04/06/2013  . GERD (gastroesophageal reflux disease)     Tums or Maalox  . Ankle fracture, left     ALLERGIES:  is allergic to prednisone and penicillins.  MEDICATIONS:  Current Outpatient Prescriptions  Medication Sig Dispense Refill  . aspirin EC 81 MG tablet Take 1 tablet (81 mg total) by mouth 2 (two) times daily. 90 tablet 0  . losartan-hydrochlorothiazide (HYZAAR) 100-12.5 MG tablet Take 1 tablet by mouth daily. 30 tablet 3  . Misc Natural Products (OSTEO BI-FLEX TRIPLE STRENGTH) TABS Take 1 tablet by mouth 2 (two) times daily.    . metoprolol (LOPRESSOR) 50 MG tablet Take 1 tablet (50 mg total) by mouth 2 (two) times daily. 60 tablet 3   No current facility-administered medications for this visit.    SURGICAL HISTORY:  Past Surgical History  Procedure Laterality Date  . Appendectomy    . Colonoscopy  2008  . Spine surgery    . Back surgery      LOWER BACK TWICE  . Abdominal hysterectomy    . Esophagogastroduodenoscopy N/A 03/29/2013    Procedure: ESOPHAGOGASTRODUODENOSCOPY (EGD);  Surgeon: Charna Elizabeth, MD;  Location: [facility] ENDOSCOPY;  Service: Endoscopy;  Laterality: N/A;  . Inguinal lymph node biopsy Right 03/31/2013    Procedure: INGUINAL LYMPH NODE BIOPSY;  Surgeon: Cherylynn Ridges, MD;  Location: [facility] OR;  Service: General;  Laterality: Right;  . Orif ankle fracture Left 11/03/2015    Procedure: OPEN REDUCTION INTERNAL FIXATION (ORIF) LEFT ANKLE FRACTURE;  Surgeon: Toni Arthurs, MD;  Location: [facility];   Service: Orthopedics;  Laterality: Left;    REVIEW OF SYSTEMS:  A comprehensive review of systems was negative.   PHYSICAL EXAMINATION: General appearance: alert, cooperative and no distress Head: Normocephalic, without obvious abnormality, atraumatic Neck: no adenopathy, no JVD, supple, symmetrical, trachea midline and thyroid not enlarged, symmetric, no tenderness/mass/nodules Lymph nodes: Cervical, supraclavicular, and axillary nodes normal. Resp: clear to auscultation bilaterally Back: symmetric, no curvature. ROM normal. No CVA tenderness. Cardio: regular rate and rhythm, S1, S2 normal, no murmur, click, rub or gallop GI: soft, non-tender; bowel sounds normal; no masses,  no organomegaly Extremities: extremities normal, atraumatic, no cyanosis or edema Neurologic: Alert and oriented X 3, normal strength and tone. Normal symmetric reflexes. Normal coordination and gait  ECOG PERFORMANCE STATUS: 1 - Symptomatic but completely ambulatory  Blood pressure 168/78, pulse 62, temperature 97.8 F (36.6 C), temperature source Oral, resp. rate 18, height 5\' 3"  (1.6 m), weight 141 lb 14.4 oz (64.365 kg), SpO2 99 %.  LABORATORY DATA: Lab Results  Component Value Date   WBC 4.3 12/14/2015   HGB 12.5 12/14/2015   HCT 38.6 12/14/2015   MCV 85.9 12/14/2015   PLT 120* 12/14/2015      Chemistry      Component Value Date/Time   NA  143 12/14/2015 0816   NA 134* 11/23/2015 0001   K 4.6 12/14/2015 0816   K 4.2 11/23/2015 0001   CL 99 11/23/2015 0001   CO2 28 12/14/2015 0816   CO2 23 11/23/2015 0001   BUN 17.4 12/14/2015 0816   BUN 21 11/23/2015 0001   CREATININE 1.2* 12/14/2015 0816   CREATININE 1.30* 11/23/2015 0001   CREATININE 1.10* 11/03/2015 1137      Component Value Date/Time   CALCIUM 9.8 12/14/2015 0816   CALCIUM 8.7 11/23/2015 0001   ALKPHOS 59 12/14/2015 0816   ALKPHOS 59 08/21/2013 1535   AST 12 12/14/2015 0816   AST 20 08/21/2013 1535   ALT 9 12/14/2015 0816   ALT 8  08/21/2013 1535   BILITOT 0.51 12/14/2015 0816   BILITOT 0.6 08/21/2013 1535       RADIOGRAPHIC STUDIES: Dg Chest 2 View  11/23/2015  CLINICAL DATA:  Fever cough shortness of breath and fatigue for several days ; history of non-Hodgkin's lymphoma EXAM: CHEST  2 VIEW COMPARISON:  Chest x-ray of December 28, 2013 FINDINGS: The lungs are well-expanded and clear. The heart and pulmonary vascularity are normal. There is no mediastinal nor hilar lymphadenopathy. The trachea is midline. There is no pleural effusion. The bony thorax exhibits no acute abnormality. IMPRESSION: There is no evidence of pneumonia nor other acute cardiopulmonary abnormality. Electronically Signed   By: David  Martinique M.D.   On: 11/23/2015 15:15   Ct Angio Chest W/cm &/or Wo Cm  11/23/2015  CLINICAL DATA:  Progressive mid chest tightness and SOB, status post ankle surgery 11/04/15, history of lymphoma - in remission since 2014 EXAM: CT ANGIOGRAPHY CHEST WITH CONTRAST TECHNIQUE: Multidetector CT imaging of the chest was performed using the standard protocol during bolus administration of intravenous contrast. Multiplanar CT image reconstructions and MIPs were obtained to evaluate the vascular anatomy. CONTRAST:  62 mL Isovue 370 COMPARISON:  11/23/2014 FINDINGS: There is adequate opacification of the pulmonary arteries. There is no pulmonary embolus. The main pulmonary artery, right main pulmonary artery and left main pulmonary arteries are normal in size. The heart size is normal. There is no pericardial effusion. The lungs are clear. There is no focal consolidation, pleural effusion or pneumothorax. There is no axillary, hilar, or mediastinal adenopathy. There are bilateral breast implants. There is no lytic or blastic osseous lesion. The visualized portions of the upper abdomen are unremarkable. Review of the MIP images confirms the above findings. IMPRESSION: 1. No evidence of pulmonary embolus. Electronically Signed   By: Kathreen Devoid    On: 11/23/2015 18:25    ASSESSMENT AND PLAN: This is a very pleasant 75 years old white female with history of stage IV large B-cell non-Hodgkin lymphoma status post systemic chemotherapy initially with the CHOP Rituxan but this was modified secondary to steroid-induced psychosis as well as cardiomyopathy. Her lab work today is unremarkable except for the mild thrombocytopenia. Her previous CT scan of the abdomen November 2016 showed no concerning findings. The patient also had CT scan of the chest performed recently was no concerning abnormalities or pulmonary embolism. I recommended for the patient to continue on observation with repeat CBC, comprehensive metabolic panel and LDH in 6 months. She was advised to call immediately if she has any concerning symptoms in the interval. The patient voices understanding of current disease status and treatment options and is in agreement with the current care plan.  All questions were answered. The patient knows to call the clinic with any problems,  questions or concerns. We can certainly see the patient much sooner if necessary.  Disclaimer: This note was dictated with voice recognition software. Similar sounding words can inadvertently be transcribed and may not be corrected upon review.

## 2015-12-29 DIAGNOSIS — S82852D Displaced trimalleolar fracture of left lower leg, subsequent encounter for closed fracture with routine healing: Secondary | ICD-10-CM | POA: Diagnosis not present

## 2016-01-02 DIAGNOSIS — S82852D Displaced trimalleolar fracture of left lower leg, subsequent encounter for closed fracture with routine healing: Secondary | ICD-10-CM | POA: Diagnosis not present

## 2016-01-05 DIAGNOSIS — S82852D Displaced trimalleolar fracture of left lower leg, subsequent encounter for closed fracture with routine healing: Secondary | ICD-10-CM | POA: Diagnosis not present

## 2016-01-09 ENCOUNTER — Other Ambulatory Visit: Payer: Self-pay | Admitting: Family Medicine

## 2016-01-10 DIAGNOSIS — S82852D Displaced trimalleolar fracture of left lower leg, subsequent encounter for closed fracture with routine healing: Secondary | ICD-10-CM | POA: Diagnosis not present

## 2016-01-13 DIAGNOSIS — S82852D Displaced trimalleolar fracture of left lower leg, subsequent encounter for closed fracture with routine healing: Secondary | ICD-10-CM | POA: Diagnosis not present

## 2016-01-17 DIAGNOSIS — S82852D Displaced trimalleolar fracture of left lower leg, subsequent encounter for closed fracture with routine healing: Secondary | ICD-10-CM | POA: Diagnosis not present

## 2016-01-20 DIAGNOSIS — S82852D Displaced trimalleolar fracture of left lower leg, subsequent encounter for closed fracture with routine healing: Secondary | ICD-10-CM | POA: Diagnosis not present

## 2016-01-24 DIAGNOSIS — S82852D Displaced trimalleolar fracture of left lower leg, subsequent encounter for closed fracture with routine healing: Secondary | ICD-10-CM | POA: Diagnosis not present

## 2016-02-09 ENCOUNTER — Other Ambulatory Visit: Payer: Self-pay | Admitting: Family Medicine

## 2016-04-24 ENCOUNTER — Other Ambulatory Visit (INDEPENDENT_AMBULATORY_CARE_PROVIDER_SITE_OTHER): Payer: Medicare Other

## 2016-04-24 DIAGNOSIS — Z23 Encounter for immunization: Secondary | ICD-10-CM

## 2016-05-04 ENCOUNTER — Telehealth: Payer: Self-pay | Admitting: Family Medicine

## 2016-05-04 MED ORDER — LOSARTAN POTASSIUM-HCTZ 100-12.5 MG PO TABS: 1.0000 | ORAL_TABLET | Freq: Every day | ORAL | 0 refills | Status: DC

## 2016-05-04 MED ORDER — METOPROLOL TARTRATE 50 MG PO TABS: 50.0000 mg | ORAL_TABLET | Freq: Two times a day (BID) | ORAL | 0 refills | Status: DC

## 2016-05-04 NOTE — Telephone Encounter (Signed)
Pt is at [facility] for month and forgot her BP medicines, she needs 30 days worth of both sent to [place] T# [phone]

## 2016-05-04 NOTE — Telephone Encounter (Signed)
done

## 2016-05-18 ENCOUNTER — Other Ambulatory Visit: Payer: Self-pay

## 2016-05-18 ENCOUNTER — Telehealth: Payer: Self-pay | Admitting: Family Medicine

## 2016-05-18 MED ORDER — LOSARTAN POTASSIUM-HCTZ 100-12.5 MG PO TABS: 1.0000 | ORAL_TABLET | Freq: Every day | ORAL | 0 refills | Status: DC

## 2016-05-18 MED ORDER — METOPROLOL TARTRATE 50 MG PO TABS: 50.0000 mg | ORAL_TABLET | Freq: Two times a day (BID) | ORAL | 0 refills | Status: DC

## 2016-05-18 NOTE — Telephone Encounter (Addendum)
Rcvd refill request for Metoprolol 50 mg #180 and Losartan 100-12.5 mg #90

## 2016-05-18 NOTE — Telephone Encounter (Signed)
Sent in meds 

## 2016-06-13 ENCOUNTER — Other Ambulatory Visit (HOSPITAL_BASED_OUTPATIENT_CLINIC_OR_DEPARTMENT_OTHER): Payer: Medicare Other

## 2016-06-13 ENCOUNTER — Telehealth: Payer: Self-pay | Admitting: Internal Medicine

## 2016-06-13 ENCOUNTER — Ambulatory Visit (HOSPITAL_BASED_OUTPATIENT_CLINIC_OR_DEPARTMENT_OTHER): Payer: Medicare Other | Admitting: Internal Medicine

## 2016-06-13 ENCOUNTER — Encounter: Payer: Self-pay | Admitting: Internal Medicine

## 2016-06-13 VITALS — BP 177/70 | HR 56 | Temp 97.8°F | Resp 18 | Ht 63.0 in | Wt 141.1 lb

## 2016-06-13 DIAGNOSIS — D696 Thrombocytopenia, unspecified: Secondary | ICD-10-CM | POA: Diagnosis not present

## 2016-06-13 DIAGNOSIS — C8333 Diffuse large B-cell lymphoma, intra-abdominal lymph nodes: Secondary | ICD-10-CM

## 2016-06-13 DIAGNOSIS — I10 Essential (primary) hypertension: Secondary | ICD-10-CM

## 2016-06-13 DIAGNOSIS — Z8572 Personal history of non-Hodgkin lymphomas: Secondary | ICD-10-CM

## 2016-06-13 LAB — CBC WITH DIFFERENTIAL/PLATELET
BASO%: 0.2 % (ref 0.0–2.0)
BASOS ABS: 0 10*3/uL (ref 0.0–0.1)
EOS ABS: 0.1 10*3/uL (ref 0.0–0.5)
EOS%: 1.8 % (ref 0.0–7.0)
HCT: 43 % (ref 34.8–46.6)
HGB: 14.3 g/dL (ref 11.6–15.9)
LYMPH%: 28.3 % (ref 14.0–49.7)
MCH: 28.7 pg (ref 25.1–34.0)
MCHC: 33.3 g/dL (ref 31.5–36.0)
MCV: 86.2 fL (ref 79.5–101.0)
MONO#: 0.5 10*3/uL (ref 0.1–0.9)
MONO%: 9.6 % (ref 0.0–14.0)
NEUT%: 60.1 % (ref 38.4–76.8)
NEUTROS ABS: 3 10*3/uL (ref 1.5–6.5)
PLATELETS: 112 10*3/uL — AB (ref 145–400)
RBC: 4.99 10*6/uL (ref 3.70–5.45)
RDW: 14.3 % (ref 11.2–14.5)
WBC: 5 10*3/uL (ref 3.9–10.3)
lymph#: 1.4 10*3/uL (ref 0.9–3.3)

## 2016-06-13 LAB — COMPREHENSIVE METABOLIC PANEL
ALK PHOS: 70 U/L (ref 40–150)
ALT: 11 U/L (ref 0–55)
ANION GAP: 10 meq/L (ref 3–11)
AST: 15 U/L (ref 5–34)
Albumin: 3.7 g/dL (ref 3.5–5.0)
BILIRUBIN TOTAL: 0.53 mg/dL (ref 0.20–1.20)
BUN: 17.7 mg/dL (ref 7.0–26.0)
CO2: 25 meq/L (ref 22–29)
Calcium: 9.5 mg/dL (ref 8.4–10.4)
Chloride: 106 mEq/L (ref 98–109)
Creatinine: 1.3 mg/dL — ABNORMAL HIGH (ref 0.6–1.1)
EGFR: 41 mL/min/{1.73_m2} — AB (ref >90)
Glucose: 94 mg/dl (ref 70–140)
POTASSIUM: 4.2 meq/L (ref 3.5–5.1)
Sodium: 141 mEq/L (ref 136–145)
TOTAL PROTEIN: 6.2 g/dL — AB (ref 6.4–8.3)

## 2016-06-13 LAB — LACTATE DEHYDROGENASE: LDH: 192 U/L (ref 125–245)

## 2016-06-13 NOTE — Progress Notes (Signed)
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  PRIOR THERAPY:   Non-Hodgkins lymphoma ([place])   03/27/2013 - 03/31/2013 Hospital Admission    Admitted to [facility] for profound anemia (Hgb 6.4).  C/o fevers, drenching nightsweats, weight lost.  Negative colonoscopy and EGD.  CT of C/A/P demonstrated significant lymphadenopathy suspcious of ympha.        03/31/2013 Initial Diagnosis    Biopsy of R inguinal lymph node revealed Non-Hodgkins lymphoma. Diffuse Large B-cell Lymphoma      04/10/2013 - 04/24/2013 Hospital Admission    Hypercalcemia, renal failure and dehydration.  Started min-RCHOP.       04/15/2013 Bone Marrow Biopsy    Involvement with Diffuse Large B-cell Lymphoma. Stage IV NHL w B-symptoms.       04/18/2013 - 04/18/2013 Chemotherapy    Mini R-CHOP Cycle #1 on 04/18/2013. Dose reduced to poor nutrion. It consisted on Cytoxan 400 mg/m2,Doxorubicin 25 mg/m2, Rituximab 375 mg/m2, Vincristine 79m.  Prednisone 100 mg daily for 9/13 - 9/16.   Received neupogen on 9/21, 9/27 -9/30.        04/25/2013 - 04/29/2013 Hospital Admission    Admitted due to worsening mouth sores/oral thrush, odynophagia, failure to thrive.  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  [facility](Dr. CGwynneth Munson and discharged to home on 05/19/2013.        06/01/2013 - 06/01/2013 Chemotherapy    Mini R-CHO #2 dosed as above.  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  Received neulasta shot on 06/02/13      06/16/2013 Cancer Staging    Re-staging PET/CT demonstrated significant interval reduction in axillary lymphadenopathy.  There was 1 small residual right axillary lymph node measuring 16 x 12 mm which showed FDG  uptake.  Signifcant reduction in mediastinal and hilar lymphadenopathy.       06/16/2013 Imaging    CT of chest noted left-sided pulmonary embolim. Started on lovenox to coumadin bridge.        06/22/2013 - 06/22/2013 Chemotherapy    Mini-R-CHO #3.  Received Neulasta on 06/23/2013.        07/16/2013 Imaging    2-D Echocardiogram demonstrates 35-45% EF with Grade 2 diastolic dysfunction.  Doxorubicin discontinued with consultation by cardiology (Dr. KClaiborne Billings.        08/19/2013 - 08/21/2013 Chemotherapy    Mini-R-CEO #4.  Etoposide 25 mg/m2 instead of doxo due to above.  Etoposide 570mbid on day 2/3.  On day 3 (08/21/2013) she received 1 of two oral doses of ectoposide.       08/21/2013 - 08/26/2013 Hospital Admission    [facility] and had influenzae A and pneumonia. Completed tamiflu and oral antibiotics.       09/25/2013 - 09/27/2013 Chemotherapy    Mini-R-CEO #5. Etoposide 25 mg/m2 25 mg bid on days 2/3.       10/16/2013 - 10/19/2013 Chemotherapy    Mini-R-CEO # 6. Etoposide 25 mg/m2 bid on days 2/3. Neulasta on day#4.       11/05/2013 Imaging    Interval resolution of the hypermetabolism identified within the enlarged right axillary lymph node seen previously. This lymph node has also decreased markedly in size in the interval. No new hypermetabolic lesions in the neck,  chest, abdomen, or  pelvis.       11/19/2013 Bone Marrow Biopsy    There is no evidence of a B-cell lymphoproliferative process in this material. Normal cytogenetics.       04/16/2014 Imaging    CT scan Chest, abdomen: No evidence of recurrent lymphoma within the chest or abdomen.   Significant decrease in splenomegaly since prior exam.        CURRENT THERAPY: Observation.  INTERVAL HISTORY: Tonya Wise 75 y.o. female returns to the clinic today for follow-up visit accompanied by her husband. The patient has no complaints today. She has pain on observation for the  last 3 years. She denied having any significant chest pain, shortness of breath, cough or hemoptysis. The patient denied having any nausea or vomiting. She denied having any significant weight loss or night sweats. She had repeat lab work performed recently and she is here for evaluation and discussion of her lab results.  MEDICAL HISTORY: Past Medical History:  Diagnosis Date  . Allergy   . Anemia   . Ankle fracture, left   . Diastolic dysfunction, grade I 04/12/2013  . GERD (gastroesophageal reflux disease)    Tums or Maalox  . Heart murmur   . Hypercalcemia 03/27/2013  . Hypertension   . Non-Hodgkins lymphoma ([place]) 04/06/2013  . Osteopenia   . Renal insufficiency     ALLERGIES:  is allergic to prednisone and penicillins.  MEDICATIONS:  Current Outpatient Prescriptions  Medication Sig Dispense Refill  . aspirin EC 81 MG tablet Take 1 tablet (81 mg total) by mouth 2 (two) times daily. 90 tablet 0  . losartan-hydrochlorothiazide (HYZAAR) 100-12.5 MG tablet Take 1 tablet by mouth daily. 90 tablet 0  . metoprolol (LOPRESSOR) 50 MG tablet Take 1 tablet (50 mg total) by mouth 2 (two) times daily. 180 tablet 0  . Misc Natural Products (OSTEO BI-FLEX TRIPLE STRENGTH) TABS Take 1 tablet by mouth 2 (two) times daily.     No current facility-administered medications for this visit.     SURGICAL HISTORY:  Past Surgical History:  Procedure Laterality Date  . ABDOMINAL HYSTERECTOMY    . APPENDECTOMY    . BACK SURGERY     LOWER BACK TWICE  . COLONOSCOPY  2008  . ESOPHAGOGASTRODUODENOSCOPY N/A 03/29/2013   Procedure: ESOPHAGOGASTRODUODENOSCOPY (EGD);  Surgeon: Juanita Craver, MD;  Location: [facility] ENDOSCOPY;  Service: Endoscopy;  Laterality: N/A;  . INGUINAL LYMPH NODE BIOPSY Right 03/31/2013   Procedure: INGUINAL LYMPH NODE BIOPSY;  Surgeon: Gwenyth Ober, MD;  Location: [place];  Service: General;  Laterality: Right;  . ORIF ANKLE FRACTURE Left 11/03/2015   Procedure: OPEN REDUCTION INTERNAL FIXATION  (ORIF) LEFT ANKLE FRACTURE;  Surgeon: Wylene Simmer, MD;  Location: [place];  Service: Orthopedics;  Laterality: Left;  . SPINE SURGERY      REVIEW OF SYSTEMS:  A comprehensive review of systems was negative.   PHYSICAL EXAMINATION: General appearance: alert, cooperative and no distress Head: Normocephalic, without obvious abnormality, atraumatic Neck: no adenopathy, no JVD, supple, symmetrical, trachea midline and thyroid not enlarged, symmetric, no tenderness/mass/nodules Lymph nodes: Cervical, supraclavicular, and axillary nodes normal. Resp: clear to auscultation bilaterally Back: symmetric, no curvature. ROM normal. No CVA tenderness. Cardio: regular rate and rhythm, S1, S2 normal, no murmur, click, rub or gallop GI: soft, non-tender; bowel sounds normal; no masses,  no organomegaly Extremities: extremities normal, atraumatic, no cyanosis or edema Neurologic: Alert and oriented X 3, normal strength and tone. Normal symmetric reflexes.  Normal coordination and gait  ECOG PERFORMANCE STATUS: 1 - Symptomatic but completely ambulatory  Blood pressure (!) 177/70, pulse (!) 56, temperature 97.8 F (36.6 C), temperature source Oral, resp. rate 18, height '5\' 3"'  (1.6 m), weight 141 lb 1.6 oz (64 kg), SpO2 100 %.  LABORATORY DATA: Lab Results  Component Value Date   WBC 5.0 06/13/2016   HGB 14.3 06/13/2016   HCT 43.0 06/13/2016   MCV 86.2 06/13/2016   PLT 112 (L) 06/13/2016      Chemistry      Component Value Date/Time   NA 143 12/14/2015 0816   K 4.6 12/14/2015 0816   CL 99 11/23/2015 0001   CO2 28 12/14/2015 0816   BUN 17.4 12/14/2015 0816   CREATININE 1.2 (H) 12/14/2015 0816      Component Value Date/Time   CALCIUM 9.8 12/14/2015 0816   ALKPHOS 59 12/14/2015 0816   AST 12 12/14/2015 0816   ALT 9 12/14/2015 0816   BILITOT 0.51 12/14/2015 0816       RADIOGRAPHIC STUDIES: No results found.  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 Her lab work today is unremarkable except for the persistent mild thrombocytopenia. Comprehensive metabolic panel and LDH are still pending. I recommended for the patient to continue on observation with repeat CBC, comprehensive metabolic panel and LDH in 6 months. For hypertension, the patient is currently on treatment with metoprolol and Hyzaar. I recommended for her to take her blood pressure medication as prescribed and to monitor it closely at home. If hypertension persistent, she will contact her primary care physician for evaluation and adjustment of her medications. She was advised to call immediately if she has any concerning symptoms in the interval. The patient voices understanding of current disease status and treatment options and is in agreement with the current care plan.  All questions were answered. The patient knows to call the clinic with any problems, questions or concerns. We can certainly see the patient much sooner if necessary.  Disclaimer: This note was dictated with voice recognition software. Similar sounding words can inadvertently be transcribed and may not be corrected upon review.

## 2016-06-13 NOTE — Telephone Encounter (Signed)
Appointments scheduled per 06/13/16 los. AVS report and appointment schedule given to patient per 06/13/16 los. °

## 2016-07-24 ENCOUNTER — Other Ambulatory Visit: Payer: Self-pay | Admitting: Nurse Practitioner

## 2016-08-09 DIAGNOSIS — L578 Other skin changes due to chronic exposure to nonionizing radiation: Secondary | ICD-10-CM | POA: Diagnosis not present

## 2016-08-09 DIAGNOSIS — D235 Other benign neoplasm of skin of trunk: Secondary | ICD-10-CM | POA: Diagnosis not present

## 2016-08-09 DIAGNOSIS — L821 Other seborrheic keratosis: Secondary | ICD-10-CM | POA: Diagnosis not present

## 2016-08-09 DIAGNOSIS — L570 Actinic keratosis: Secondary | ICD-10-CM | POA: Diagnosis not present

## 2016-08-09 DIAGNOSIS — L814 Other melanin hyperpigmentation: Secondary | ICD-10-CM | POA: Diagnosis not present

## 2016-08-14 IMAGING — CT CT ANGIO CHEST
2 of 6 series · 19 of 36 positions shown · IV contrast (isovue)
Comparison: 11/23/2014

CLINICAL DATA: Progressive mid chest tightness and SOB, status post

EXAM:
CT ANGIOGRAPHY CHEST WITH CONTRAST
TECHNIQUE: Multidetector CT imaging of the chest was performed using the
standard protocol during bolus administration of intravenous
contrast. Multiplanar CT image reconstructions and MIPs were
obtained to evaluate the vascular anatomy.
CONTRAST:  62 mL Isovue 370

[Series 6: pe thins @ 1mm · axial · 0.62mm/px · z∈[-326,-98]mm · 18 of 254 slices shown]
[im 13/254  lung]
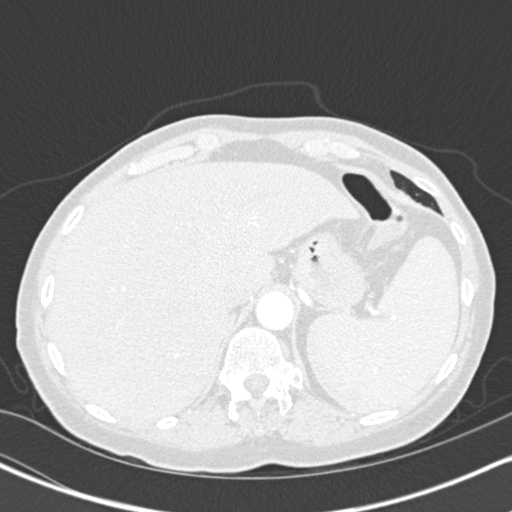
[im 26/254  mediastinal]
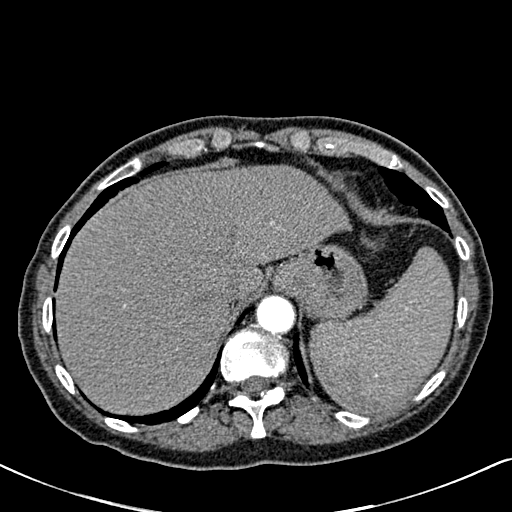
[im 38/254  lung]
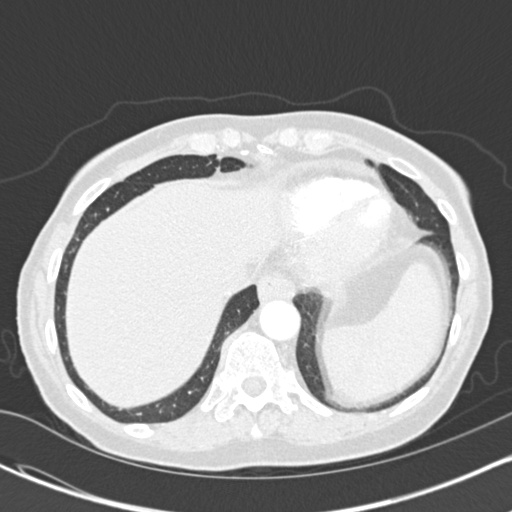
[im 51/254  mediastinal]
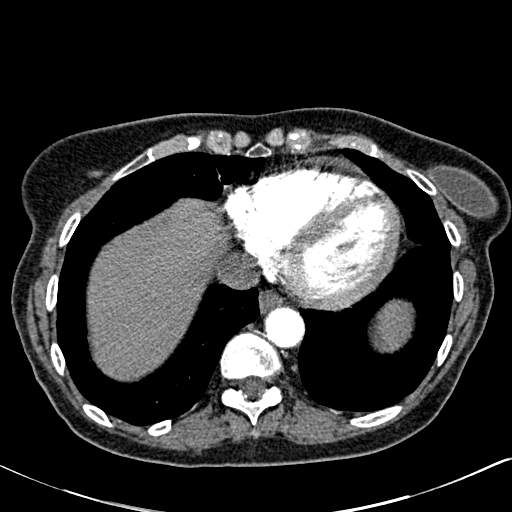
[im 64/254  lung]
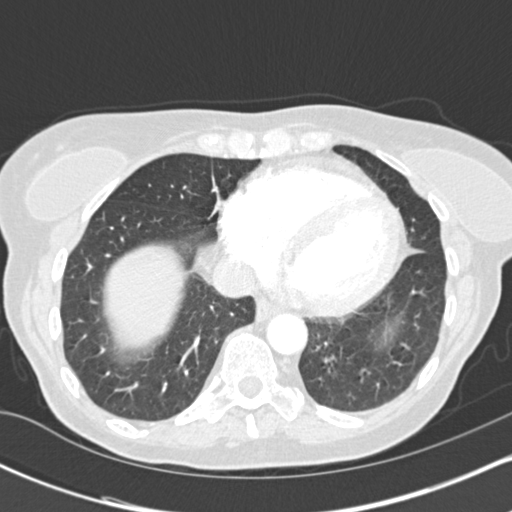
[im 76/254  mediastinal]
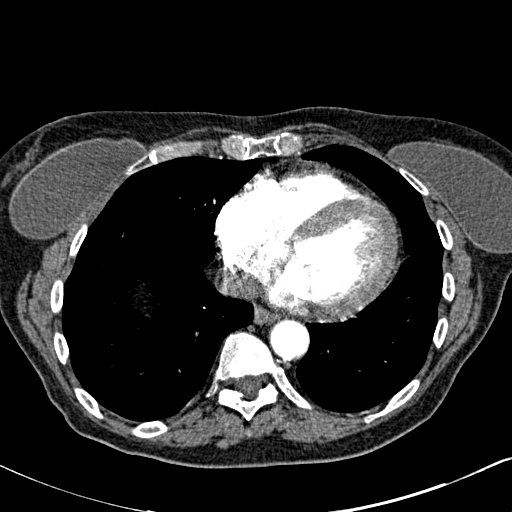
[im 89/254  lung]
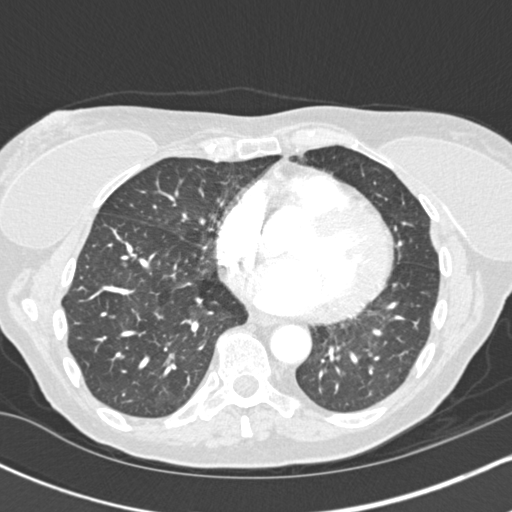
[im 102/254  mediastinal]
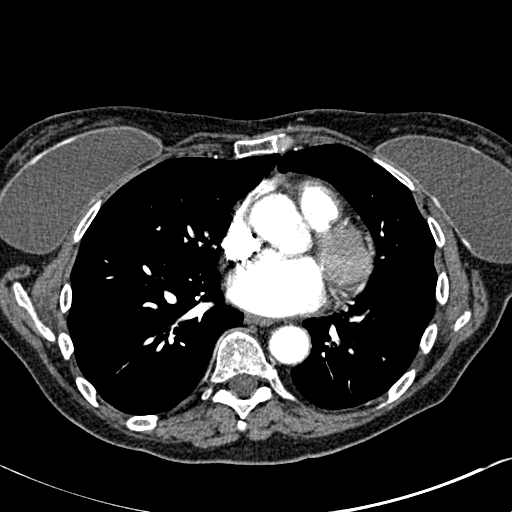
[im 114/254  lung]
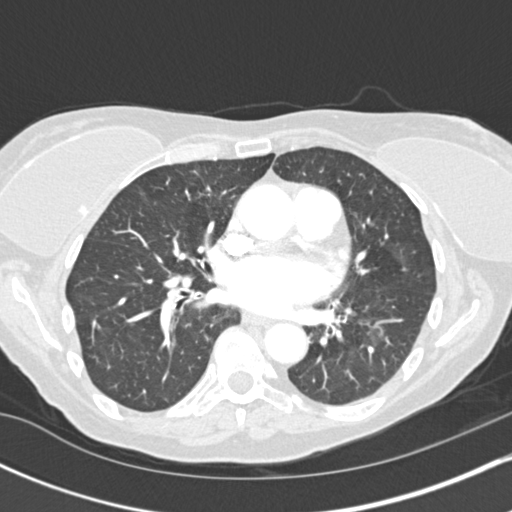
[im 140/254  mediastinal]
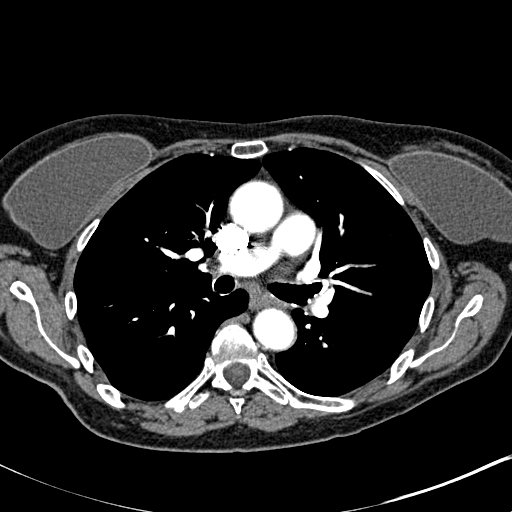
[im 152/254  lung]
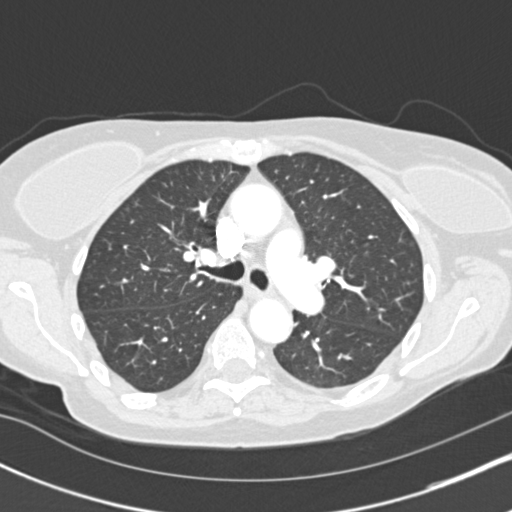
[im 165/254  mediastinal]
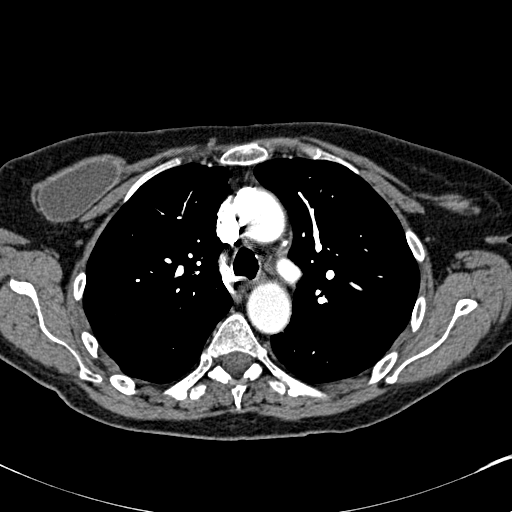
[im 178/254  lung]
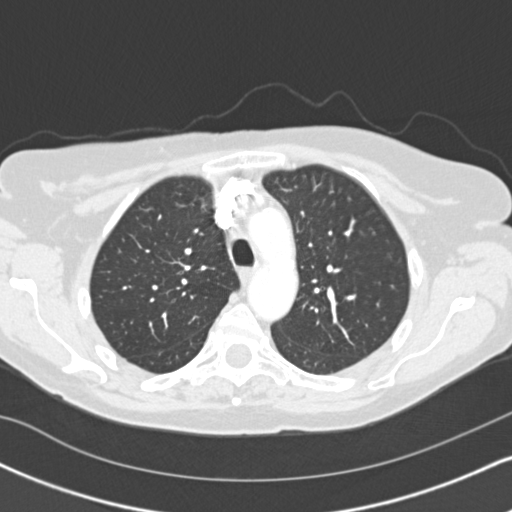
[im 190/254  mediastinal]
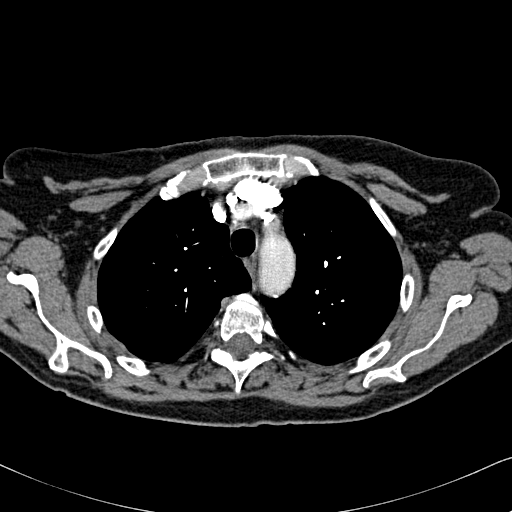
[im 203/254  lung]
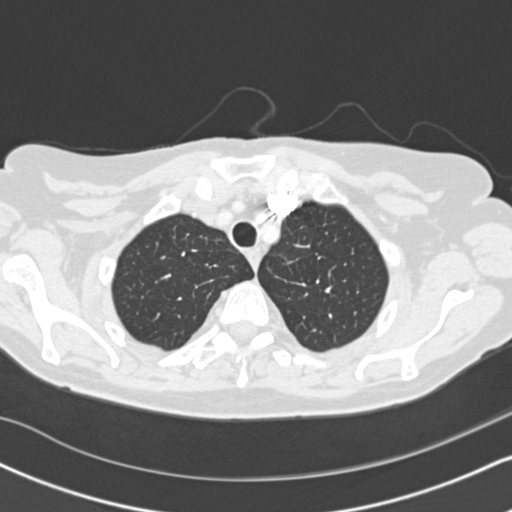
[im 216/254  mediastinal]
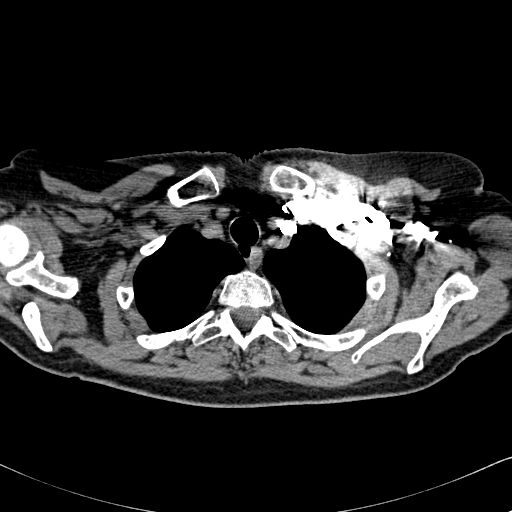
[im 228/254  lung]
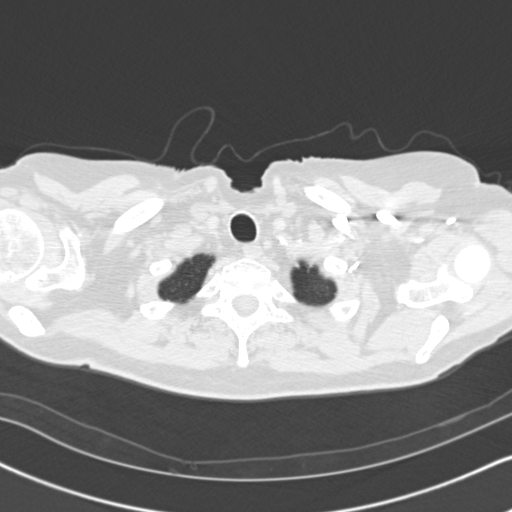
[im 241/254  mediastinal]
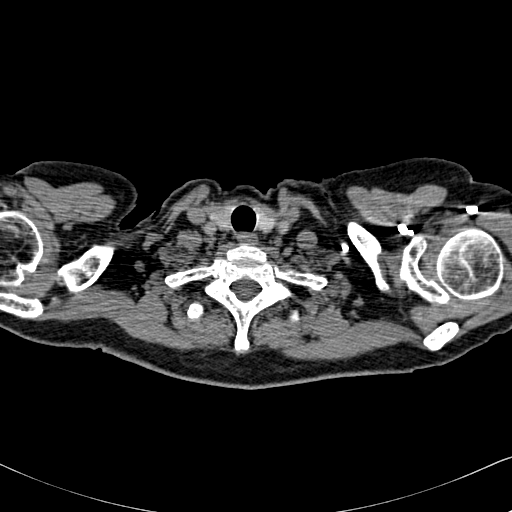

[Series 602: coronal mpr · coronal · 0.62mm/px · 1 of 96 slices shown]
[im 48/96  mediastinal]
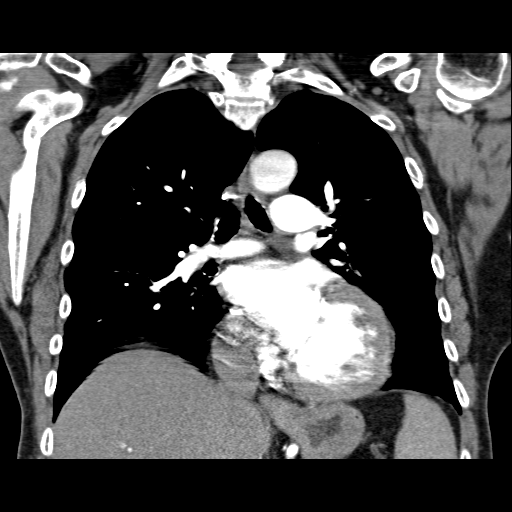

[19 of 36 positions shown; findings below may reference images not displayed]

FINDINGS: There is adequate opacification of the pulmonary arteries. There is
no pulmonary embolus. The main pulmonary artery, right main
pulmonary artery and left main pulmonary arteries are normal in
size. The heart size is normal. There is no pericardial effusion.

The lungs are clear. There is no focal consolidation, pleural
effusion or pneumothorax.

There is no axillary, hilar, or mediastinal adenopathy.

There are bilateral breast implants.

There is no lytic or blastic osseous lesion.

The visualized portions of the upper abdomen are unremarkable.

Review of the MIP images confirms the above findings.
IMPRESSION: 1. No evidence of pulmonary embolus.

## 2016-08-21 ENCOUNTER — Ambulatory Visit: Payer: Medicare Other | Admitting: Family Medicine

## 2016-08-22 ENCOUNTER — Ambulatory Visit (INDEPENDENT_AMBULATORY_CARE_PROVIDER_SITE_OTHER): Payer: Medicare Other | Admitting: Family Medicine

## 2016-08-22 ENCOUNTER — Encounter: Payer: Self-pay | Admitting: Family Medicine

## 2016-08-22 VITALS — BP 116/80 | HR 58 | Temp 97.9°F | Wt 141.4 lb

## 2016-08-22 DIAGNOSIS — K529 Noninfective gastroenteritis and colitis, unspecified: Secondary | ICD-10-CM

## 2016-08-22 DIAGNOSIS — M461 Sacroiliitis, not elsewhere classified: Secondary | ICD-10-CM

## 2016-08-22 NOTE — Patient Instructions (Addendum)
Eat whatever you want. Drink plenty of fluids. Use Imodium as needed for the diarrhea. If things change call. You can take 2 Aleve twice a day for the pain and cramping. Heat for 20 minutes 3 times per day and then do some gentle stretching after that. Can also take 2 Aleve twice per day for the next several weeks

## 2016-08-22 NOTE — Progress Notes (Signed)
   Subjective:    Patient ID: Tonya Wise, female    DOB: 1941/06/11, 76 y.o.   MRN: [id number]  HPI She complains of a five-day history of lower abdominal pain, cramping with some diarrhea, fatigue but no vomiting, fever or chills. She does complain of some slight dizziness and nausea. No sore throat, earache, cough or congestion. She also has a several year history dating back to previous surgery in 2005 and 6 in her low back. She has been intermittently taking care of this with physical therapy. The pain has been intermittent until the last several weeks when she noted right posterior hip pain. No previous history of injury. No numbness tingling or weakness.   Review of Systems     Objective:   Physical Exam Alert and in no distress. Tympanic membranes and canals are normal. Pharyngeal area is normal. Neck is supple without adenopathy or thyromegaly. Cardiac exam shows a regular sinus rhythm without murmurs or gallops. Lungs are clear to auscultation. Back exam does show a surgical scar midline. She is tender to palpation over the right upper SI joint. Fabere testing was positive. Stork test negative. Normal DTRs.       Assessment & Plan:  Acute gastroenteritis  Sacroiliitis ([place]) Recommend supportive care for the gastroenteritis. Using Lomotil on an as-needed basis. Recommended eating whatever she is comfortable with. For care of her back she is to do heat for 20 minutes followed by stretching. Stretching was demonstrated. Also anti-inflammatory of choice.

## 2016-08-29 ENCOUNTER — Telehealth: Payer: Self-pay | Admitting: Medical Oncology

## 2016-08-29 NOTE — Telephone Encounter (Signed)
If No improvement, She will need repeat scan of her abdomen and pelvis.

## 2016-08-29 NOTE — Telephone Encounter (Signed)
Reports ongoing stomach pain x 8-10 days -"pressure ,continuous where I cannot sleep at night. I also have waves of nausea. When I eat I fill up fast and can't eat much and my stomach feels hard   My diarrhea has been worse in past 4 days and I was up 4 times last night". She denies fever ,night sweats. She saw Dr Redmond School for these symptoms and was told  to "give it a week and take aleve that it may be viral". Note to [place].

## 2016-08-30 NOTE — Telephone Encounter (Signed)
Called and notified pt of MD instructions. Pt verbalized understanding. No further concerns.

## 2016-09-04 ENCOUNTER — Telehealth: Payer: Self-pay | Admitting: Medical Oncology

## 2016-09-04 NOTE — Telephone Encounter (Signed)
F/u call from last week, Her abdominal symptoms are resolved. 'I am feeling much better"

## 2016-12-12 ENCOUNTER — Encounter: Payer: Self-pay | Admitting: Internal Medicine

## 2016-12-12 ENCOUNTER — Other Ambulatory Visit (HOSPITAL_BASED_OUTPATIENT_CLINIC_OR_DEPARTMENT_OTHER): Payer: Medicare Other

## 2016-12-12 ENCOUNTER — Telehealth: Payer: Self-pay | Admitting: Internal Medicine

## 2016-12-12 ENCOUNTER — Ambulatory Visit (HOSPITAL_BASED_OUTPATIENT_CLINIC_OR_DEPARTMENT_OTHER): Payer: Medicare Other | Admitting: Internal Medicine

## 2016-12-12 VITALS — BP 144/84 | HR 53 | Temp 98.4°F | Resp 16 | Ht 63.0 in | Wt 143.3 lb

## 2016-12-12 DIAGNOSIS — C8333 Diffuse large B-cell lymphoma, intra-abdominal lymph nodes: Secondary | ICD-10-CM

## 2016-12-12 DIAGNOSIS — C8338 Diffuse large B-cell lymphoma, lymph nodes of multiple sites: Secondary | ICD-10-CM

## 2016-12-12 DIAGNOSIS — I10 Essential (primary) hypertension: Secondary | ICD-10-CM | POA: Diagnosis not present

## 2016-12-12 DIAGNOSIS — D696 Thrombocytopenia, unspecified: Secondary | ICD-10-CM

## 2016-12-12 LAB — COMPREHENSIVE METABOLIC PANEL
ALT: 10 U/L (ref 0–55)
AST: 13 U/L (ref 5–34)
Albumin: 3.6 g/dL (ref 3.5–5.0)
Alkaline Phosphatase: 55 U/L (ref 40–150)
Anion Gap: 8 mEq/L (ref 3–11)
BUN: 20.3 mg/dL (ref 7.0–26.0)
CO2: 26 mEq/L (ref 22–29)
Calcium: 8.9 mg/dL (ref 8.4–10.4)
Chloride: 107 mEq/L (ref 98–109)
Creatinine: 1.3 mg/dL — ABNORMAL HIGH (ref 0.6–1.1)
EGFR: 41 mL/min/{1.73_m2} — AB (ref >90)
GLUCOSE: 96 mg/dL (ref 70–140)
POTASSIUM: 4 meq/L (ref 3.5–5.1)
SODIUM: 141 meq/L (ref 136–145)
Total Bilirubin: 0.55 mg/dL (ref 0.20–1.20)
Total Protein: 5.7 g/dL — ABNORMAL LOW (ref 6.4–8.3)

## 2016-12-12 LAB — CBC WITH DIFFERENTIAL/PLATELET
BASO%: 0.6 % (ref 0.0–2.0)
BASOS ABS: 0 10*3/uL (ref 0.0–0.1)
EOS%: 1.7 % (ref 0.0–7.0)
Eosinophils Absolute: 0.1 10*3/uL (ref 0.0–0.5)
HCT: 40.2 % (ref 34.8–46.6)
HEMOGLOBIN: 13.4 g/dL (ref 11.6–15.9)
LYMPH%: 36 % (ref 14.0–49.7)
MCH: 28 pg (ref 25.1–34.0)
MCHC: 33.4 g/dL (ref 31.5–36.0)
MCV: 83.8 fL (ref 79.5–101.0)
MONO#: 0.4 10*3/uL (ref 0.1–0.9)
MONO%: 9.5 % (ref 0.0–14.0)
NEUT%: 52.2 % (ref 38.4–76.8)
NEUTROS ABS: 2.4 10*3/uL (ref 1.5–6.5)
Platelets: 111 10*3/uL — ABNORMAL LOW (ref 145–400)
RBC: 4.79 10*6/uL (ref 3.70–5.45)
RDW: 14.3 % (ref 11.2–14.5)
WBC: 4.6 10*3/uL (ref 3.9–10.3)
lymph#: 1.6 10*3/uL (ref 0.9–3.3)

## 2016-12-12 LAB — LACTATE DEHYDROGENASE: LDH: 174 U/L (ref 125–245)

## 2016-12-12 NOTE — Telephone Encounter (Signed)
Gave patient AVS and calender per 5/16 los. Lab/CT/ follow up in one year.

## 2016-12-12 NOTE — Progress Notes (Signed)
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  PRIOR THERAPY:   Non-Hodgkins lymphoma ([place])   03/27/2013 - 03/31/2013 Hospital Admission    Admitted to [facility] for profound anemia (Hgb 6.4).  C/o fevers, drenching nightsweats, weight lost.  Negative colonoscopy and EGD.  CT of C/A/P demonstrated significant lymphadenopathy suspcious of ympha.        03/31/2013 Initial Diagnosis    Biopsy of R inguinal lymph node revealed Non-Hodgkins lymphoma. Diffuse Large B-cell Lymphoma      04/10/2013 - 04/24/2013 Hospital Admission    Hypercalcemia, renal failure and dehydration.  Started min-RCHOP.       04/15/2013 Bone Marrow Biopsy    Involvement with Diffuse Large B-cell Lymphoma. Stage IV NHL w B-symptoms.       04/18/2013 - 04/18/2013 Chemotherapy    Mini R-CHOP Cycle #1 on 04/18/2013. Dose reduced to poor nutrion. It consisted on Cytoxan 400 mg/m2,Doxorubicin 25 mg/m2, Rituximab 375 mg/m2, Vincristine 58m.  Prednisone 100 mg daily for 9/13 - 9/16.   Received neupogen on 9/21, 9/27 -9/30.        04/25/2013 - 04/29/2013 Hospital Admission    Admitted due to worsening mouth sores/oral thrush, odynophagia, failure to thrive.  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  [facility]Dr. CGwynneth Munson and discharged to home on 05/19/2013.        06/01/2013 - 06/01/2013 Chemotherapy    Mini R-CHO #2 dosed as above.  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  Received neulasta shot on 06/02/13      06/16/2013 Cancer Staging    Re-staging PET/CT demonstrated significant interval reduction in axillary lymphadenopathy.  There was 1 small residual right axillary lymph node measuring 16 x 12 mm which showed FDG uptake.   Signifcant reduction in mediastinal and hilar lymphadenopathy.       06/16/2013 Imaging    CT of chest noted left-sided pulmonary embolim. Started on lovenox to coumadin bridge.        06/22/2013 - 06/22/2013 Chemotherapy    Mini-R-CHO #3.  Received Neulasta on 06/23/2013.        07/16/2013 Imaging    2-D Echocardiogram demonstrates 35-45% EF with Grade 2 diastolic dysfunction.  Doxorubicin discontinued with consultation by cardiology (Dr. KClaiborne Billings.        08/19/2013 - 08/21/2013 Chemotherapy    Mini-R-CEO #4.  Etoposide 25 mg/m2 instead of doxo due to above.  Etoposide 543mbid on day 2/3.  On day 3 (08/21/2013) she received 1 of two oral doses of ectoposide.       08/21/2013 - 08/26/2013 Hospital Admission    [facility] and had influenzae A and pneumonia. Completed tamiflu and oral antibiotics.       09/25/2013 - 09/27/2013 Chemotherapy    Mini-R-CEO #5. Etoposide 25 mg/m2 25 mg bid on days 2/3.       10/16/2013 - 10/19/2013 Chemotherapy    Mini-R-CEO # 6. Etoposide 25 mg/m2 bid on days 2/3. Neulasta on day#4.       11/05/2013 Imaging    Interval resolution of the hypermetabolism identified within the enlarged right axillary lymph node seen previously. This lymph node has also decreased markedly in size in the interval. No new hypermetabolic lesions in the  neck, chest, abdomen, or  pelvis.       11/19/2013 Bone Marrow Biopsy    There is no evidence of a B-cell lymphoproliferative process in this material. Normal cytogenetics.       04/16/2014 Imaging    CT scan Chest, abdomen: No evidence of recurrent lymphoma within the chest or abdomen.   Significant decrease in splenomegaly since prior exam.        CURRENT THERAPY: Observation.  INTERVAL HISTORY: Tonya Wise 76 y.o. female returns to the clinic today for follow-up visit accompanied by her husband. The patient is feeling fine today with no specific complaints except for occasional  dizzy spells. She is on multiple blood pressure medication and her heart rate is always in the low side. She denied having any chest pain, shortness of breath, cough or hemoptysis. She denied having any fever or chills. She has no nausea, vomiting, diarrhea or constipation. The patient denied having any weight loss or night sweats. She is here today for evaluation with repeat blood work.   MEDICAL HISTORY: Past Medical History:  Diagnosis Date  . Allergy   . Anemia   . Ankle fracture, left   . Diastolic dysfunction, grade I 04/12/2013  . GERD (gastroesophageal reflux disease)    Tums or Maalox  . Heart murmur   . Hypercalcemia 03/27/2013  . Hypertension   . Non-Hodgkins lymphoma ([place]) 04/06/2013  . Osteopenia   . Renal insufficiency     ALLERGIES:  is allergic to prednisone and penicillins.  MEDICATIONS:  Current Outpatient Prescriptions  Medication Sig Dispense Refill  . aspirin EC 81 MG tablet Take 1 tablet (81 mg total) by mouth 2 (two) times daily. 90 tablet 0  . cholecalciferol (VITAMIN D) 1000 units tablet Take 1,000 Units by mouth daily.    . cyanocobalamin 500 MCG tablet Take 1,000 mcg by mouth daily.    Marland Kitchen losartan-hydrochlorothiazide (HYZAAR) 100-12.5 MG tablet Take 1 tablet by mouth daily. 90 tablet 0  . metoprolol (LOPRESSOR) 50 MG tablet Take 1 tablet (50 mg total) by mouth 2 (two) times daily. 180 tablet 0  . Misc Natural Products (OSTEO BI-FLEX TRIPLE STRENGTH) TABS Take 1 tablet by mouth 2 (two) times daily.     No current facility-administered medications for this visit.     SURGICAL HISTORY:  Past Surgical History:  Procedure Laterality Date  . ABDOMINAL HYSTERECTOMY    . APPENDECTOMY    . BACK SURGERY     LOWER BACK TWICE  . COLONOSCOPY  2008  . ESOPHAGOGASTRODUODENOSCOPY N/A 03/29/2013   Procedure: ESOPHAGOGASTRODUODENOSCOPY (EGD);  Surgeon: Juanita Craver, MD;  Location[facility] ENDOSCOPY;  Service: Endoscopy;  Laterality: N/A;  . INGUINAL LYMPH NODE BIOPSY Right  03/31/2013   Procedure: INGUINAL LYMPH NODE BIOPSY;  Surgeon: Gwenyth Ober, MD;  Location: [facility];  Service: General;  Laterality: Right;  . ORIF ANKLE FRACTURE Left 11/03/2015   Procedure: OPEN REDUCTION INTERNAL FIXATION (ORIF) LEFT ANKLE FRACTURE;  Surgeon: Wylene Simmer, MD;  Location: [facility];  Service: Orthopedics;  Laterality: Left;  . SPINE SURGERY      REVIEW OF SYSTEMS:  A comprehensive review of systems was negative except for: Neurological: positive for dizziness   PHYSICAL EXAMINATION: General appearance: alert, cooperative and no distress Head: Normocephalic, without obvious abnormality, atraumatic Neck: no adenopathy, no JVD, supple, symmetrical, trachea midline and thyroid not enlarged, symmetric, no tenderness/mass/nodules Lymph nodes: Cervical, supraclavicular, and axillary nodes normal. Resp: clear to auscultation bilaterally Back: symmetric, no curvature.  ROM normal. No CVA tenderness. Cardio: regular rate and rhythm, S1, S2 normal, no murmur, click, rub or gallop GI: soft, non-tender; bowel sounds normal; no masses,  no organomegaly Extremities: extremities normal, atraumatic, no cyanosis or edema  ECOG PERFORMANCE STATUS: 1 - Symptomatic but completely ambulatory  Blood pressure (!) 144/84, pulse (!) 53, temperature 98.4 F (36.9 C), temperature source Oral, resp. rate 16, height '5\' 3"'  (1.6 m), weight 143 lb 4.8 oz (65 kg), SpO2 98 %.  LABORATORY DATA: Lab Results  Component Value Date   WBC 4.6 12/12/2016   HGB 13.4 12/12/2016   HCT 40.2 12/12/2016   MCV 83.8 12/12/2016   PLT 111 (L) 12/12/2016      Chemistry      Component Value Date/Time   NA 141 06/13/2016 0801   K 4.2 06/13/2016 0801   CL 99 11/23/2015 0001   CO2 25 06/13/2016 0801   BUN 17.7 06/13/2016 0801   CREATININE 1.3 (H) 06/13/2016 0801      Component Value Date/Time   CALCIUM 9.5 06/13/2016 0801   ALKPHOS 70 06/13/2016 0801   AST 15 06/13/2016 0801   ALT 11 06/13/2016  0801   BILITOT 0.53 06/13/2016 0801       RADIOGRAPHIC STUDIES: No results found.  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 The patient is currently on observation and she is doing fine with no significant complaints except for the occasional dizzy spells which could be related to her blood pressure medication. Her CBC today is unremarkable except for mild thrombocytopenia. I will see the patient back for follow-up visit in one year for evaluation and repeat CT scan of the chest, abdomen and pelvis for restaging of her disease. For hypertension, she will continue with her current blood pressure medication and I advised her to reconsult with her primary care physician for adjustment of her treatment because of the dizzy spells. The patient was advised to call immediately if she has any concerning symptoms in the interval. The patient voices understanding of current disease status and treatment options and is in agreement with the current care plan. All questions were answered. The patient knows to call the clinic with any problems, questions or concerns. We can certainly see the patient much sooner if necessary. I spent 10 minutes counseling the patient face to face. The total time spent in the appointment was 15 minutes.  Disclaimer: This note was dictated with voice recognition software. Similar sounding words can inadvertently be transcribed and may not be corrected upon review.

## 2016-12-27 ENCOUNTER — Ambulatory Visit (INDEPENDENT_AMBULATORY_CARE_PROVIDER_SITE_OTHER): Payer: Medicare Other | Admitting: Family Medicine

## 2016-12-27 ENCOUNTER — Encounter: Payer: Self-pay | Admitting: Family Medicine

## 2016-12-27 VITALS — BP 120/68 | HR 88 | Ht 62.5 in | Wt 144.0 lb

## 2016-12-27 DIAGNOSIS — I10 Essential (primary) hypertension: Secondary | ICD-10-CM

## 2016-12-27 DIAGNOSIS — M8580 Other specified disorders of bone density and structure, unspecified site: Secondary | ICD-10-CM | POA: Diagnosis not present

## 2016-12-27 DIAGNOSIS — Z23 Encounter for immunization: Secondary | ICD-10-CM

## 2016-12-27 DIAGNOSIS — C8333 Diffuse large B-cell lymphoma, intra-abdominal lymph nodes: Secondary | ICD-10-CM

## 2016-12-27 DIAGNOSIS — D696 Thrombocytopenia, unspecified: Secondary | ICD-10-CM | POA: Diagnosis not present

## 2016-12-27 DIAGNOSIS — Z13820 Encounter for screening for osteoporosis: Secondary | ICD-10-CM | POA: Diagnosis not present

## 2016-12-27 DIAGNOSIS — I429 Cardiomyopathy, unspecified: Secondary | ICD-10-CM

## 2016-12-27 DIAGNOSIS — E785 Hyperlipidemia, unspecified: Secondary | ICD-10-CM

## 2016-12-27 LAB — LIPID PANEL
CHOL/HDL RATIO: 3.4 ratio (ref <5.0)
CHOLESTEROL: 190 mg/dL (ref <200)
HDL: 56 mg/dL (ref >50)
LDL Cholesterol: 106 mg/dL — ABNORMAL HIGH (ref <100)
Triglycerides: 138 mg/dL (ref <150)
VLDL: 28 mg/dL (ref <30)

## 2016-12-27 MED ORDER — METOPROLOL TARTRATE 50 MG PO TABS: 50.0000 mg | ORAL_TABLET | Freq: Two times a day (BID) | ORAL | 3 refills | Status: DC

## 2016-12-27 MED ORDER — LOSARTAN POTASSIUM-HCTZ 100-12.5 MG PO TABS: 1.0000 | ORAL_TABLET | Freq: Every day | ORAL | 3 refills | Status: DC

## 2016-12-27 NOTE — Progress Notes (Signed)
Subjective:   HPI  Tonya Wise is a 76 y.o. female who presents for Chief Complaint  Patient presents with  . Medicare Wellness    Med check plus    Medical care team includes: Denita Lung, MD here for primary care  Melburn Popper    Preventative care:  Last ophthalmology visit: several years Last dental visit: 5/18 Last colonoscopy:10/11/11 Last mammogram: Several years ago. Does not want a repeat Last pap: Several years ago. Last EKG:12/20/14 Last labs: with Lipids 11/23/14  Prior vaccinations:  TD or Tdap: 2014 Influenza:04/24/16 Pneumococcal:23: 04/15/06, 0913/14 Shingles/Zostavax: 04/15/06 Advanced directive: No. Information given.  Concerns: She does note difficulty with lightheadedness when she goes from lying to standing. She has been checking her blood pressures at home and the systolics run in the 412-878 range. She's had no chest pain, shortness of breath, PND or DOE he last saw cardiology in May 2016. She follows up with Dr. Earlie Server regularly. She does have a previous history of osteopenia but has not had a follow-up DEXA scan. She continues on her blood pressure medications with no difficulty. She is married and her home life is going quite well.  Reviewed their medical, surgical, family, social, medication, and allergy history and updated chart as appropriate.  Past Medical History:  Diagnosis Date  . Allergy   . Anemia   . Ankle fracture, left   . Diastolic dysfunction, grade I 04/12/2013  . GERD (gastroesophageal reflux disease)    Tums or Maalox  . Heart murmur   . Hypercalcemia 03/27/2013  . Hypertension   . Non-Hodgkins lymphoma ([place]) 04/06/2013  . Osteopenia   . Renal insufficiency     Past Surgical History:  Procedure Laterality Date  . ABDOMINAL HYSTERECTOMY    . APPENDECTOMY    . BACK SURGERY     LOWER BACK TWICE  . COLONOSCOPY  2008  . ESOPHAGOGASTRODUODENOSCOPY N/A 03/29/2013   Procedure: ESOPHAGOGASTRODUODENOSCOPY (EGD);   Surgeon: Juanita Craver, MD;  Location: [facility] ENDOSCOPY;  Service: Endoscopy;  Laterality: N/A;  . INGUINAL LYMPH NODE BIOPSY Right 03/31/2013   Procedure: INGUINAL LYMPH NODE BIOPSY;  Surgeon: Gwenyth Ober, MD;  Location: [facility];  Service: General;  Laterality: Right;  . ORIF ANKLE FRACTURE Left 11/03/2015   Procedure: OPEN REDUCTION INTERNAL FIXATION (ORIF) LEFT ANKLE FRACTURE;  Surgeon: Wylene Simmer, MD;  Location: [facility];  Service: Orthopedics;  Laterality: Left;  . SPINE SURGERY      Social History   Social History  . Marital status: Married    Spouse name: [name]  . Number of children: 2  . Years of education: 12   Occupational History  .  [employer]   Social History Main Topics  . Smoking status: Never Smoker  . Smokeless tobacco: Never Used  . Alcohol use No  . Drug use: No  . Sexual activity: Yes    Partners: Male   Other Topics Concern  . Not on file   Social History Narrative   Married.  Lives in [place] with husband.  Ambulates with a walker when able.    Family History  Problem Relation Age of Onset  . Heart attack Father 35  . Leukemia Mother   . Diabetes Brother   . Diabetes Sister      Current Outpatient Prescriptions:  .  aspirin EC 81 MG tablet, Take 1 tablet (81 mg total) by mouth 2 (two) times daily., Disp: 90 tablet, Rfl: 0 .  cholecalciferol (VITAMIN D)  1000 units tablet, Take 1,000 Units by mouth daily., Disp: , Rfl:  .  cyanocobalamin 500 MCG tablet, Take 1,000 mcg by mouth daily., Disp: , Rfl:  .  losartan-hydrochlorothiazide (HYZAAR) 100-12.5 MG tablet, Take 1 tablet by mouth daily., Disp: 90 tablet, Rfl: 3 .  metoprolol tartrate (LOPRESSOR) 50 MG tablet, Take 1 tablet (50 mg total) by mouth 2 (two) times daily., Disp: 180 tablet, Rfl: 3 .  Misc Natural Products (OSTEO BI-FLEX TRIPLE STRENGTH) TABS, Take 1 tablet by mouth 2 (two) times daily., Disp: , Rfl:   Allergies  Allergen Reactions  . Prednisone Other (See Comments)     "like being in a coma"  . Penicillins Itching, Rash and Other (See Comments)    "burning sensation" Has patient had a PCN reaction causing immediate rash, facial/tongue/throat swelling, SOB or lightheadedness with hypotension: No Has patient had a PCN reaction causing severe rash involving mucus membranes or skin necrosis: No Has patient had a PCN reaction that required hospitalization No Has patient had a PCN reaction occurring within the last 10 years: No If all of the above answers are "NO", then may proceed with Cephalosporin use.       Review of Systems Constitutional: -fever, -chills, -sweats, -unexpected weight change, -decreased appetite, -fatigue Allergy: -sneezing, -itching, -congestion Dermatology: -changing moles, --rash, -lumps ENT: -runny nose, -ear pain, -sore throat, -hoarseness, -sinus pain, -teeth pain, - ringing in ears, -hearing loss, -nosebleeds Cardiology: -chest pain, -palpitations, -swelling, -difficulty breathing when lying flat, -waking up short of breath Respiratory: -cough, -shortness of breath, -difficulty breathing with exercise or exertion, -wheezing, -coughing up blood Gastroenterology: -abdominal pain, -nausea, -vomiting, -diarrhea, -constipation, -blood in stool, -changes in bowel movement, -difficulty swallowing or eating Hematology: -bleeding, -bruising  Musculoskeletal: -joint aches, -muscle aches, -joint swelling, -back pain, -neck pain, -cramping, -changes in gait Ophthalmology: denies vision changes, eye redness, itching, discharge Urology: -burning with urination, -difficulty urinating, -blood in urine, -urinary frequency, -urgency, -incontinence Neurology: -headache, -weakness, -tingling, -numbness, -memory loss, -falls, -dizziness Psychology: -depressed mood, -agitation, -sleep problems Breast/gyn: -breast tendnerss, -discharge, -lumps, -vaginal discharge,- irregular periods, -heavy periods    Objective:   Vitals:   12/27/16 1017  BP: 120/68   Pulse: 88    General appearance: alert, no distress, WD/WN, Caucasian female Skin: norrmal HEENT: normocephalic, conjunctiva/corneas normal, sclerae anicteric, PERRLA, EOMi, nares patent, no discharge or erythema, pharynx normal Oral cavity: MMM, tongue normal, teeth normal Neck: supple, no lymphadenopathy, no thyromegaly, no masses, normal ROM, no bruits Heart: RRR, normal S1, S2, no murmurs Lungs: CTA bilaterally, no wheezes, rhonchi, or rales Abdomen: +bs, soft, non tender, non distended, no masses, no hepatomegaly, no splenomegaly, no bruits  Musculoskeletal: upper extremities non tender, no obvious deformity, normal ROM throughout, lower extremities non tender, no obvious deformity, normal ROM throughout Extremities: no edema, no cyanosis, no clubbing Pulses: 2+ symmetric, upper and lower extremities, normal cap refill Neurological: alert, oriented x 3, CN2-12 intact, strength normal upper extremities and lower extremities, sensation normal throughout, DTRs 2+ throughout, no cerebellar signs, gait normal Psychiatric: normal affect, behavior normal, pleasant     Assessment and Plan :    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 I will set her up to see cardiology to follow-up on her cardiomyopathy. Her immunizations were updated. She is also to go to the pharmacy to get Shingrix vaccine.  Continue on her present medications. DEXA scan ordered.  Physical exam - discussed and counseled on healthy lifestyle, diet, exercise, preventative care, vaccinations, sick and well care, proper use of emergency dept and after hours care, and addressed their concerns.

## 2016-12-27 NOTE — Patient Instructions (Signed)
  Tonya Wise , Thank you for taking time to come for your Medicare Wellness Visit. I appreciate your ongoing commitment to your health goals. Please review the following plan we discussed and let me know if I can assist you in the future.   These are the goals we discussed: Goals    None      This is a list of the screening recommended for you and due dates:  Health Maintenance  Topic Date Due  . Complete foot exam   04/24/1951  . Eye exam for diabetics  04/24/1951  . Tetanus Vaccine  04/23/1960  . DEXA scan (bone density measurement)  04/23/2006  . Hemoglobin A1C  09/26/2013  . Pneumonia vaccines (2 of 2 - PCV13) 04/11/2014  . Flu Shot  02/27/2017  . Colon Cancer Screening  10/10/2021

## 2017-01-11 ENCOUNTER — Telehealth: Payer: Self-pay | Admitting: Family Medicine

## 2017-01-11 NOTE — Telephone Encounter (Signed)
Pt came in and stated that her last visit she was told by [facility] that she needed to be on medication for osteopenia and something would be sent in for her. I do not see anything in the notes concerning that and nothing was sent in. Please advise pt she can be reached at [phone] and uses [facility] on [address] .

## 2017-01-11 NOTE — Telephone Encounter (Signed)
Called pt she hasn't had  Her bone density yet. She didn't know that she it had to be set up . Gave her the phone number to get the arrange.

## 2017-01-11 NOTE — Telephone Encounter (Signed)
I don't see her DEXA scan

## 2017-01-13 NOTE — Progress Notes (Signed)
Cardiology Office Note    Date:  01/15/2017   ID:  Tonya Wise, DOB 05-28-1941, MRN [id number]  PCP:  Denita Lung, MD  Cardiologist: Dr. Claiborne Billings  Chief Complaint  Patient presents with  . Follow-up    17-month    History of Present Illness:    Tonya Wise is a 76 y.o. 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   She was last examined by Dr. Claiborne Billings in 11/2014 and reported doing well from a cardiac perspective at that time, denying any recent chest pain, dyspnea on exertion, orthopnea, or PND. She has a history of chemotherapy-induced cardiomyopathy with EF at 35-40% in 2014 and echo in 08/2014 showing no significant changes.   In talking with the patient today, she reports doing well since her last office visit. She completed her chemotherapy treatments in 2015 and has been in remission since 11/2016. She denies any recent chest discomfort, dyspnea on exertion, orthopnea, PND, lower extremity edema, palpitations, or presyncope. Does note occasional fatigue which occurs with push mowing the yard and working in the garden in the extreme heat. No symptoms when performing housework or walking around the grocery store.  She reports good compliance with her medication regimen and says her systolic blood pressure varies from the 120's to 140's. At 132/64 during today's visit.  Past Medical History:  Diagnosis Date  . Allergy   . Anemia   . Ankle fracture, left   . Chemotherapy induced cardiomyopathy ([place])    a. 08/2014: echo showing EF of 35-40% with Grade 2 DD and mild MR  . Diastolic dysfunction, grade I 04/12/2013  . GERD (gastroesophageal reflux disease)    Tums or Maalox  . Heart murmur   . Hypercalcemia 03/27/2013  . Hypertension   . Non-Hodgkins lymphoma ([place]) 04/06/2013  .  Osteopenia   . Renal insufficiency     Past Surgical History:  Procedure Laterality Date  . ABDOMINAL HYSTERECTOMY    . APPENDECTOMY    . BACK SURGERY     LOWER BACK TWICE  . COLONOSCOPY  2008  . ESOPHAGOGASTRODUODENOSCOPY N/A 03/29/2013   Procedure: ESOPHAGOGASTRODUODENOSCOPY (EGD);  Surgeon: Juanita Craver, MD;  [facility] END[facility]COPY;  Service: Endoscopy;  Laterality: N/A;  . INGUINAL LYMPH NODE BIOPSY Right 03/31/2013   Procedure: INGUINAL LYMPH NODE BIOPSY;  Surgeon: Gwenyth Ober, MD;  Location: [facility];  Service: General;  Laterality: Right;  . ORIF ANKLE FRACTURE Left 11/03/2015   Procedure: OPEN REDUCTION INTERNAL FIXATION (ORIF) LEFT ANKLE FRACTURE;  Surgeon: Wylene Simmer, MD;  Location: [facility];  Service: Orthopedics;  Laterality: Left;  . SPINE SURGERY      Current Medications: Outpatient Medications Prior to Visit  Medication Sig Dispense Refill  . aspirin EC 81 MG tablet Take 1 tablet (81 mg total) by mouth 2 (two) times daily. 90 tablet 0  . cholecalciferol (VITAMIN D) 1000 units tablet Take 1,000 Units by mouth daily.    . cyanocobalamin 500 MCG tablet Take 1,000 mcg by mouth daily.    Marland Kitchen losartan-hydrochlorothiazide (HYZAAR) 100-12.5 MG tablet Take 1 tablet by mouth daily. 90 tablet 3  . metoprolol tartrate (LOPRESSOR) 50 MG tablet Take 1 tablet (50 mg total) by mouth 2 (two) times daily. 180 tablet 3  . Misc Natural Products (OSTEO BI-FLEX TRIPLE STRENGTH) TABS Take 1 tablet  by mouth 2 (two) times daily.     No facility-administered medications prior to visit.      Allergies:   Prednisone and Penicillins   Social History   Social History  . Marital status: Married    Spouse name: [name]  . Number of children: 2  . Years of education: 12   Occupational History  .  [employer]   Social History Main Topics  . Smoking status: Never Smoker  . Smokeless tobacco: Never Used  . Alcohol use No  . Drug use: No  . Sexual activity: Yes    Partners:  Male   Other Topics Concern  . None   Social History Narrative   Married.  Lives in [place] with husband.  Ambulates with a walker when able.     Family History:  The patient's family history includes Diabetes in her brother and sister; Heart attack (age of onset: 49) in her father; Leukemia in her mother.   Review of Systems:   Please see the history of present illness.     General:  No chills, fever, night sweats or weight changes. Positive for occasional fatigue.  Cardiovascular:  No chest pain, dyspnea on exertion, edema, orthopnea, palpitations, paroxysmal nocturnal dyspnea. Dermatological: No rash, lesions/masses Respiratory: No cough, dyspnea Urologic: No hematuria, dysuria Abdominal:   No nausea, vomiting, diarrhea, bright red blood per rectum, melena, or hematemesis Neurologic:  No visual changes, wkns, changes in mental status. All other systems reviewed and are otherwise negative except as noted above.   Physical Exam:    VS:  BP 132/64   Pulse (!) 55   Ht 5' 2.5" (1.588 m)   Wt 141 lb (64 kg)   BMI 25.38 kg/m    General: Well developed, well nourished Caucasian female appearing in no acute distress. Head: Normocephalic, atraumatic, sclera non-icteric, no xanthomas, nares are without discharge.  Neck: No carotid bruits. JVD not elevated.  Lungs: Respirations regular and unlabored, without wheezes or rales.  Heart: Regular rate and rhythm. No S3 or S4.  No murmur, no rubs, or gallops appreciated. Abdomen: Soft, non-tender, non-distended with normoactive bowel sounds. No hepatomegaly. No rebound/guarding. No obvious abdominal masses. Msk:  Strength and tone appear normal for age. No joint deformities or effusions. Extremities: No clubbing or cyanosis. No lower extremity edema.  Distal pedal pulses are 2+ bilaterally. Neuro: Alert and oriented X 3. Moves all extremities spontaneously. No focal deficits noted. Psych:  Responds to questions appropriately with a  normal affect. Skin: No rashes or lesions noted  Wt Readings from Last 3 Encounters:  01/15/17 141 lb (64 kg)  12/27/16 144 lb (65.3 kg)  12/12/16 143 lb 4.8 oz (65 kg)     Studies/Labs Reviewed:   EKG:  EKG is ordered today. The ekg ordered today demonstrates sinus bradycardia, HR 55, with LVH and repol abnormality significantly improved when compared to prior tracings.   Recent Labs: 12/12/2016: ALT 10; BUN 20.3; Creatinine 1.3; HGB 13.4; Platelets 111; Potassium 4.0; Sodium 141   Lipid Panel    Component Value Date/Time   CHOL 190 12/27/2016 1029   TRIG 138 12/27/2016 1029   HDL 56 12/27/2016 1029   CHOLHDL 3.4 12/27/2016 1029   VLDL 28 12/27/2016 1029   LDLCALC 106 (H) 12/27/2016 1029    Additional studies/ records that were reviewed today include:   Echocardiogram: 08/2014 Study Conclusions  - Left ventricle: The cavity size was normal. Wall thickness was normal. Systolic function was moderately reduced. The  estimated ejection fraction was in the range of 35% to 40%. Diffuse hypokinesis. Features are consistent with a pseudonormal left ventricular filling pattern, with concomitant abnormal relaxation and increased filling pressure (grade 2 diastolic dysfunction). - Mitral valve: There was mild regurgitation.  Impressions:  - Compared to the prior study, there has been no significant interval change.  Assessment:    1. Chemotherapy induced cardiomyopathy ([place] [place])   2. Essential hypertension   3. Sinus bradycardia      Plan:   In order of problems listed above:  1. Chemotherapy-induced Cardiomyopathy - she has a history of chemotherapy-induced cardiomyopathy occurring during her treatment of non-Hodgkin's lymphoma. She finished treatments in 2015 and EF was at 35-40% by echo in 08/2014.  - she denies any recent chest discomfort, dyspnea on exertion, orthopnea, PND, lower extremity edema, palpitations, or presyncope.  - her EKG shows significant  improvement in her LVH when compared to prior tracings. We discussed repeating an echocardiogram to reassess her EF but she declines at this time which I think is reasonable considering she is asymptomatic from a heart failure perspective and is already on the appropriate medication regimen.  - continue ACE-I and BB therapy.   2. HTN - BP is well-controlled at 132/64. - continue Losartan-HCTZ 100-12.5mg  daily and Lopressor 50mg  BID.   3. Sinus Bradycardia - HR at 55 during today's visit. Continue Lopressor 50mg  BID. Would not further titrate her dosing with borderline bradycardia.   Medication Adjustments/Labs and Tests Ordered: Current medicines are reviewed at length with the patient today.  Concerns regarding medicines are outlined above.  Medication changes, Labs and Tests ordered today are listed in the Patient Instructions below. Patient Instructions  Medication Instructions:  Your physician recommends that you continue on your current medications as directed. Please refer to the Current Medication list given to you today.  If you need a refill on your cardiac medications before your next appointment, please call your pharmacy.  Follow-Up: Your physician wants you to follow-up in: [place] will receive a reminder letter in the mail two months in advance. If you don't receive a letter, please call our office April 2019 to schedule the June 2019 follow-up appointment.  Thank you for choosing [facility] at [employer] [facility], Erma Heritage, PA-C  01/15/2017 11:18 AM    [address], [place], [place]  [address] Phone: [phone]; Fax: [phone]  [address] [place], [place] [address] Phone: [phone]

## 2017-01-15 ENCOUNTER — Encounter: Payer: Self-pay | Admitting: Student

## 2017-01-15 ENCOUNTER — Ambulatory Visit (INDEPENDENT_AMBULATORY_CARE_PROVIDER_SITE_OTHER): Payer: Medicare Other | Admitting: Student

## 2017-01-15 VITALS — BP 132/64 | HR 55 | Ht 62.5 in | Wt 141.0 lb

## 2017-01-15 DIAGNOSIS — I10 Essential (primary) hypertension: Secondary | ICD-10-CM

## 2017-01-15 DIAGNOSIS — R001 Bradycardia, unspecified: Secondary | ICD-10-CM | POA: Diagnosis not present

## 2017-01-15 DIAGNOSIS — T451X5A Adverse effect of antineoplastic and immunosuppressive drugs, initial encounter: Secondary | ICD-10-CM | POA: Diagnosis not present

## 2017-01-15 DIAGNOSIS — I427 Cardiomyopathy due to drug and external agent: Secondary | ICD-10-CM

## 2017-01-15 NOTE — Patient Instructions (Signed)
Medication Instructions:  Your physician recommends that you continue on your current medications as directed. Please refer to the Current Medication list given to you today.  If you need a refill on your cardiac medications before your next appointment, please call your pharmacy.  Follow-Up: Your physician wants you to follow-up in: [place] will receive a reminder letter in the mail two months in advance. If you don't receive a letter, please call our office April 2019 to schedule the June 2019 follow-up appointment.  Thank you for choosing [facility] at [facility]!!

## 2017-01-25 ENCOUNTER — Ambulatory Visit
Admission: RE | Admit: 2017-01-25 | Discharge: 2017-01-25 | Disposition: A | Discharge status: Home / Self Care | Payer: Medicare Other | Source: Ambulatory Visit | Attending: Family Medicine | Admitting: Family Medicine

## 2017-01-25 DIAGNOSIS — M810 Age-related osteoporosis without current pathological fracture: Secondary | ICD-10-CM | POA: Diagnosis not present

## 2017-01-25 DIAGNOSIS — Z13820 Encounter for screening for osteoporosis: Secondary | ICD-10-CM

## 2017-01-25 DIAGNOSIS — M8580 Other specified disorders of bone density and structure, unspecified site: Secondary | ICD-10-CM

## 2017-01-25 DIAGNOSIS — Z780 Asymptomatic menopausal state: Secondary | ICD-10-CM | POA: Diagnosis not present

## 2017-02-18 ENCOUNTER — Encounter: Payer: Self-pay | Admitting: Family Medicine

## 2017-02-18 ENCOUNTER — Ambulatory Visit (INDEPENDENT_AMBULATORY_CARE_PROVIDER_SITE_OTHER): Payer: Medicare Other | Admitting: Family Medicine

## 2017-02-18 VITALS — BP 130/82 | HR 59 | Wt 144.8 lb

## 2017-02-18 DIAGNOSIS — M810 Age-related osteoporosis without current pathological fracture: Secondary | ICD-10-CM | POA: Diagnosis not present

## 2017-02-18 MED ORDER — IBANDRONATE SODIUM 150 MG PO TABS: 150.0000 mg | ORAL_TABLET | ORAL | 3 refills | Status: DC

## 2017-02-18 MED ORDER — IBANDRONATE SODIUM 150 MG PO TABS: ORAL_TABLET | ORAL | 3 refills | Status: DC

## 2017-02-18 NOTE — Addendum Note (Signed)
Addended by: Minette Headland A on: 02/18/2017 02:17 PM   Modules accepted: Orders

## 2017-02-18 NOTE — Progress Notes (Signed)
   Subjective:    Patient ID: Tonya Wise, female    DOB: 10-15-1940, 76 y.o.   MRN: [id number]  HPI She is here to discuss recent DEXA scan which did show osteoporosis. She does have a previous history of lymphoma. She did fracture her ankle in the recent past but this has healed quite nicely.   Review of Systems     Objective:   Physical Exam Alert and in no distress otherwise not examined       Assessment & Plan:  Age-related osteoporosis without current pathological fracture - Plan: ibandronate (BONIVA) 150 MG tablet, VITAMIN D 25 Hydroxy (Vit-D Deficiency, Fractures)  I discussed the diagnosis of osteoporosis in regard to fractures. Encouraged her to exercise regularly, take a multivitamin with iron and extra vitamin D with calcium. Discussed the use of taking the medication on an empty stomach in the upright position. We will recheck the DEXA in 2 years.

## 2017-02-19 LAB — VITAMIN D 25 HYDROXY (VIT D DEFICIENCY, FRACTURES): Vit D, 25-Hydroxy: 45 ng/mL (ref 30–100)

## 2017-03-20 ENCOUNTER — Telehealth: Payer: Self-pay | Admitting: Family Medicine

## 2017-03-20 MED ORDER — ALENDRONATE SODIUM 70 MG PO TABS: 70.0000 mg | ORAL_TABLET | ORAL | 11 refills | Status: DC

## 2017-03-20 NOTE — Telephone Encounter (Signed)
Pt called and is wanting to know if you would switch her medicine boniva to fosmax she states that the boniva is almost 150 dollars and month and the pharmacy told her that the fosmax was only 14 dollars a month, she said she will do whatever you want her to do, she can be reached at [phone] and pt uses [place], [place]

## 2017-04-22 ENCOUNTER — Other Ambulatory Visit (INDEPENDENT_AMBULATORY_CARE_PROVIDER_SITE_OTHER): Payer: Medicare Other

## 2017-04-22 DIAGNOSIS — Z23 Encounter for immunization: Secondary | ICD-10-CM | POA: Diagnosis not present

## 2017-08-05 ENCOUNTER — Ambulatory Visit (INDEPENDENT_AMBULATORY_CARE_PROVIDER_SITE_OTHER): Payer: Medicare Other | Admitting: Medical

## 2017-08-05 ENCOUNTER — Encounter: Payer: Self-pay | Admitting: Medical

## 2017-08-05 VITALS — BP 128/70 | HR 55 | Temp 98.0°F | Wt 145.6 lb

## 2017-08-05 DIAGNOSIS — R42 Dizziness and giddiness: Secondary | ICD-10-CM | POA: Diagnosis not present

## 2017-08-05 DIAGNOSIS — R05 Cough: Secondary | ICD-10-CM

## 2017-08-05 DIAGNOSIS — R52 Pain, unspecified: Secondary | ICD-10-CM

## 2017-08-05 DIAGNOSIS — R059 Cough, unspecified: Secondary | ICD-10-CM

## 2017-08-05 DIAGNOSIS — R109 Unspecified abdominal pain: Secondary | ICD-10-CM

## 2017-08-05 LAB — POCT URINALYSIS DIP (PROADVANTAGE DEVICE)
Bilirubin, UA: NEGATIVE
Glucose, UA: NEGATIVE mg/dL
Ketones, POC UA: NEGATIVE mg/dL
Leukocytes, UA: NEGATIVE
NITRITE UA: NEGATIVE
PH UA: 6 (ref 5.0–8.0)
PROTEIN UA: NEGATIVE mg/dL
RBC UA: NEGATIVE
SPECIFIC GRAVITY, URINE: 1.015
UUROB: NEGATIVE

## 2017-08-05 NOTE — Progress Notes (Signed)
Subjective:  Tonya Wise is a 77 y.o. female who presents for not feeling well.  Accompanied by her husband.    She has a history of lymphoma and cardiomyopathy.  She has about a week hx/o cough, sore throat, nonproductive cough, possibly some fever, loose stool, achy including skin hurts, some dizziness.   Denies vomiting, no lymph nodes swollen, no blood in stool, no urinary c/o, no rash.   Has had some sick contacts.    Has felt quite fatigued.  Normally she is active,e but has mainly been in bed last few days.     She notes that her lymphoma in the past started out with similar symptoms.    Thus, they wanted to come in and get things checked out.     Patient is not a smoker. No other aggravating or relieving factors.  No other c/o.  Past Medical History:  Diagnosis Date  . Allergy   . Anemia   . Ankle fracture, left   . Chemotherapy induced cardiomyopathy ([place])    a. 08/2014: echo showing EF of 35-40% with Grade 2 DD and mild MR  . Diastolic dysfunction, grade I 04/12/2013  . GERD (gastroesophageal reflux disease)    Tums or Maalox  . Heart murmur   . Hypercalcemia 03/27/2013  . Hypertension   . Non-Hodgkins lymphoma ([place]) 04/06/2013  . Osteopenia   . Renal insufficiency    Current Outpatient Medications on File Prior to Visit  Medication Sig Dispense Refill  . aspirin EC 81 MG tablet Take 1 tablet (81 mg total) by mouth 2 (two) times daily. 90 tablet 0  . cholecalciferol (VITAMIN D) 1000 units tablet Take 1,000 Units by mouth daily.    . cyanocobalamin 500 MCG tablet Take 1,000 mcg by mouth daily.    Marland Kitchen losartan-hydrochlorothiazide (HYZAAR) 100-12.5 MG tablet Take 1 tablet by mouth daily. 90 tablet 3  . metoprolol tartrate (LOPRESSOR) 50 MG tablet Take 1 tablet (50 mg total) by mouth 2 (two) times daily. 180 tablet 3  . Misc Natural Products (OSTEO BI-FLEX TRIPLE STRENGTH) TABS Take 1 tablet by mouth 2 (two) times daily.     No current facility-administered medications on  file prior to visit.     ROS as in subjective   Objective: BP 128/70   Pulse (!) 55   Temp 98 F (36.7 C)   Wt 145 lb 9.6 oz (66 kg)   SpO2 98%   BMI 26.21 kg/m   General appearance: Alert, WD/WN, no distress, mildly ill appearing                             Skin: warm, no rash                           Head: no sinus tenderness                            Eyes: conjunctiva normal, corneas clear, PERRLA                            Ears: pearly TMs, external ear canals normal                          Nose: septum midline, turbinates without swelling or erythema and no discharge  Mouth/throat: MMM, tongue normal, mild pharyngeal erythema                           Neck: supple, no adenopathy, no thyromegaly, non tender                          Heart: RRR, normal S1, S2, no murmurs                         Lungs: CTA bilaterally, no wheezes, rales, or rhonchi Abdomen: +bs, soft, mild generalized tenderness, no mass, no organomegaly Ext: no edema      Assessment  Encounter Diagnoses  Name Primary?  . Dizziness Yes  . Cough   . Body aches   . Stomach pain       Plan: I suspect she has had a flulike illness given the symptoms and given the recent flu cases in the community.   However given her prior history we will check some labs today.  I reviewed her cardiology notes from June 2018.  For now advised rest, hydration, and if this was flulike illness, it will take probably another week for her to  [place]  Patient was advised to call or return if worse or not improving in the next few days.    Patient voiced understanding recommendations and evaluation  Shaylynne was seen today for sore throat , dizziness.  Diagnoses and all orders for this visit:  Dizziness -     CBC with Differential/Platelet -     Comprehensive metabolic panel -     Lactate dehydrogenase  Cough -     CBC with Differential/Platelet -     Comprehensive metabolic panel -     Lactate  dehydrogenase  Body aches -     CBC with Differential/Platelet -     Comprehensive metabolic panel -     Lactate dehydrogenase  Stomach pain -     POCT Urinalysis DIP (Proadvantage Device)

## 2017-08-06 LAB — CBC WITH DIFFERENTIAL/PLATELET
BASOS PCT: 0.7 %
Basophils Absolute: 50 cells/uL (ref 0–200)
EOS ABS: 163 {cells}/uL (ref 15–500)
Eosinophils Relative: 2.3 %
HEMATOCRIT: 42.9 % (ref 35.0–45.0)
Hemoglobin: 14.5 g/dL (ref 11.7–15.5)
LYMPHS ABS: 2024 {cells}/uL (ref 850–3900)
MCH: 27.8 pg (ref 27.0–33.0)
MCHC: 33.8 g/dL (ref 32.0–36.0)
MCV: 82.3 fL (ref 80.0–100.0)
MPV: 10.5 fL (ref 7.5–12.5)
Monocytes Relative: 7.9 %
Neutro Abs: 4303 cells/uL (ref 1500–7800)
Neutrophils Relative %: 60.6 %
Platelets: 145 10*3/uL (ref 140–400)
RBC: 5.21 10*6/uL — AB (ref 3.80–5.10)
RDW: 13 % (ref 11.0–15.0)
Total Lymphocyte: 28.5 %
WBC: 7.1 10*3/uL (ref 3.8–10.8)
WBCMIX: 561 {cells}/uL (ref 200–950)

## 2017-08-06 LAB — COMPREHENSIVE METABOLIC PANEL
AG RATIO: 2.2 (calc) (ref 1.0–2.5)
ALT: 7 U/L (ref 6–29)
AST: 16 U/L (ref 10–35)
Albumin: 4.1 g/dL (ref 3.6–5.1)
Alkaline phosphatase (APISO): 58 U/L (ref 33–130)
BUN / CREAT RATIO: 13 (calc) (ref 6–22)
BUN: 19 mg/dL (ref 7–25)
CALCIUM: 9.2 mg/dL (ref 8.6–10.4)
CO2: 33 mmol/L — AB (ref 20–32)
Chloride: 100 mmol/L (ref 98–110)
Creat: 1.46 mg/dL — ABNORMAL HIGH (ref 0.60–0.93)
Globulin: 1.9 g/dL (calc) (ref 1.9–3.7)
Glucose, Bld: 88 mg/dL (ref 65–99)
Potassium: 4.3 mmol/L (ref 3.5–5.3)
Sodium: 140 mmol/L (ref 135–146)
Total Bilirubin: 0.5 mg/dL (ref 0.2–1.2)
Total Protein: 6 g/dL — ABNORMAL LOW (ref 6.1–8.1)

## 2017-08-06 LAB — LACTATE DEHYDROGENASE: LDH: 174 U/L (ref 120–250)

## 2017-08-12 DIAGNOSIS — D225 Melanocytic nevi of trunk: Secondary | ICD-10-CM | POA: Diagnosis not present

## 2017-08-12 DIAGNOSIS — L821 Other seborrheic keratosis: Secondary | ICD-10-CM | POA: Diagnosis not present

## 2017-08-12 DIAGNOSIS — L814 Other melanin hyperpigmentation: Secondary | ICD-10-CM | POA: Diagnosis not present

## 2017-08-12 DIAGNOSIS — L570 Actinic keratosis: Secondary | ICD-10-CM | POA: Diagnosis not present

## 2017-08-19 ENCOUNTER — Ambulatory Visit (INDEPENDENT_AMBULATORY_CARE_PROVIDER_SITE_OTHER): Payer: Medicare Other | Admitting: Medical

## 2017-08-19 ENCOUNTER — Telehealth: Payer: Self-pay

## 2017-08-19 ENCOUNTER — Encounter: Payer: Self-pay | Admitting: Medical

## 2017-08-19 VITALS — BP 130/68 | HR 55 | Temp 97.9°F | Wt 146.6 lb

## 2017-08-19 DIAGNOSIS — R059 Cough, unspecified: Secondary | ICD-10-CM

## 2017-08-19 DIAGNOSIS — R05 Cough: Secondary | ICD-10-CM

## 2017-08-19 MED ORDER — HYDROCODONE-HOMATROPINE 5-1.5 MG/5ML PO SYRP: 5.0000 mL | ORAL_SOLUTION | Freq: Three times a day (TID) | ORAL | 0 refills | Status: DC | PRN

## 2017-08-19 NOTE — Telephone Encounter (Signed)
Pt called with concerns of a blood clot . Pt says she was just in and dx flu. Pt was made a appointment for this afternoon. [place]

## 2017-08-19 NOTE — Progress Notes (Signed)
    Subjective:   Tonya Wise is a 77 y.o. female presenting on 08/19/2017 with coughing (coughing more at night , concern that it may be blood clot )  Here along with her husband today for cough.  I saw her on January 7 for flu like symptoms, dizziness.  She feels much improved from that visit, does not feel sick now but has a nagging cough.  The cough is worse at night keeping her up, not able to sleep well due to the cough.  Cough is nonproductive, no wheezing no shortness of breath no fever no dizziness body aches or chills.  More than aggravating ongoing cough.  She was having some cough and I saw her last time.  Denies calf pain, no calf swelling no recent travel no recent surgery or procedure no bedridden status.  She has been lethargic.  She has been taking several over-the-counter cough medicines without relief.  She notes years ago while on the treatment of her cancer she had a pulmonary embolism but has not not had DVT or PE any other time.  No other aggravating or relieving factors.  No other complaint.  Review of Systems ROS as in subjective     Objective: BP 130/68   Pulse (!) 55   Temp 97.9 F (36.6 C)   Wt 146 lb 9.6 oz (66.5 kg)   SpO2 96%   BMI 26.39 kg/m   General appearance: alert, no distress, WD/WN HEENT: normocephalic, sclerae anicteric, TMs pearly, nares patent, no discharge or erythema, pharynx normal Oral cavity: MMM, no lesions Neck: supple, no lymphadenopathy, no thyromegaly, no masses Heart: RRR, normal S1, S2, no murmurs Lungs: CTA bilaterally, no wheezes, rhonchi, or rales Ext: no edema, no calve tenderness swelling or palpable cord Pulses: 2+ symmetric, upper and lower extremities, normal cap refill     Assessment: Encounter Diagnosis  Name Primary?  . Cough Yes     Plan: I reviewed her visit notes from January 7 for when I saw her as well as the lab results.  We discussed differential diagnosis.  We discussed possibly getting a d-dimer, but  they declined at this time and pulmonary embolism seems less likely.  At this point she will continue with good hydration, clinically she is much improved from last visit, she can use cough syrup below as needed, cautioned for sedation.  Advised if not seeing significant improvement through the week let me know we can get a chest x-ray and d-dimer.  Follow-up within a week with call back for recheck.  Tonya Wise was seen today for coughing.  Diagnoses and all orders for this visit:  Cough -     DG Chest 2 View; Future  Other orders -     HYDROcodone-homatropine (HYCODAN) 5-1.5 MG/5ML syrup; Take 5 mLs by mouth every 8 (eight) hours as needed for cough.

## 2017-08-23 ENCOUNTER — Emergency Department (HOSPITAL_COMMUNITY)
Admission: EM | Admit: 2017-08-23 | Discharge: 2017-08-23 | Disposition: A | Discharge status: Home / Self Care | Payer: Medicare Other | Attending: Emergency Medicine | Admitting: Emergency Medicine

## 2017-08-23 ENCOUNTER — Other Ambulatory Visit: Payer: Self-pay

## 2017-08-23 ENCOUNTER — Encounter (HOSPITAL_COMMUNITY): Payer: Self-pay | Admitting: Emergency Medicine

## 2017-08-23 ENCOUNTER — Emergency Department (HOSPITAL_COMMUNITY): Payer: Medicare Other

## 2017-08-23 DIAGNOSIS — I10 Essential (primary) hypertension: Secondary | ICD-10-CM | POA: Diagnosis not present

## 2017-08-23 DIAGNOSIS — R1032 Left lower quadrant pain: Secondary | ICD-10-CM | POA: Diagnosis not present

## 2017-08-23 DIAGNOSIS — R1030 Lower abdominal pain, unspecified: Secondary | ICD-10-CM | POA: Diagnosis not present

## 2017-08-23 DIAGNOSIS — Z79899 Other long term (current) drug therapy: Secondary | ICD-10-CM | POA: Diagnosis not present

## 2017-08-23 DIAGNOSIS — Z7982 Long term (current) use of aspirin: Secondary | ICD-10-CM | POA: Insufficient documentation

## 2017-08-23 DIAGNOSIS — R112 Nausea with vomiting, unspecified: Secondary | ICD-10-CM | POA: Diagnosis not present

## 2017-08-23 DIAGNOSIS — R195 Other fecal abnormalities: Secondary | ICD-10-CM | POA: Diagnosis not present

## 2017-08-23 LAB — COMPREHENSIVE METABOLIC PANEL
ALT: 13 U/L — AB (ref 14–54)
AST: 20 U/L (ref 15–41)
Albumin: 4 g/dL (ref 3.5–5.0)
Alkaline Phosphatase: 52 U/L (ref 38–126)
Anion gap: 11 (ref 5–15)
BUN: 14 mg/dL (ref 6–20)
CALCIUM: 9.2 mg/dL (ref 8.9–10.3)
CO2: 26 mmol/L (ref 22–32)
Chloride: 104 mmol/L (ref 101–111)
Creatinine, Ser: 1.19 mg/dL — ABNORMAL HIGH (ref 0.44–1.00)
GFR, EST AFRICAN AMERICAN: 50 mL/min — AB (ref >60)
GFR, EST NON AFRICAN AMERICAN: 43 mL/min — AB (ref >60)
Glucose, Bld: 110 mg/dL — ABNORMAL HIGH (ref 65–99)
Potassium: 3.7 mmol/L (ref 3.5–5.1)
SODIUM: 141 mmol/L (ref 135–145)
Total Bilirubin: 0.9 mg/dL (ref 0.3–1.2)
Total Protein: 6 g/dL — ABNORMAL LOW (ref 6.5–8.1)

## 2017-08-23 LAB — CBC
HCT: 44.3 % (ref 36.0–46.0)
Hemoglobin: 14.8 g/dL (ref 12.0–15.0)
MCH: 28.8 pg (ref 26.0–34.0)
MCHC: 33.4 g/dL (ref 30.0–36.0)
MCV: 86.2 fL (ref 78.0–100.0)
Platelets: 116 10*3/uL — ABNORMAL LOW (ref 150–400)
RBC: 5.14 MIL/uL — AB (ref 3.87–5.11)
RDW: 14.1 % (ref 11.5–15.5)
WBC: 4.9 10*3/uL (ref 4.0–10.5)

## 2017-08-23 LAB — POC OCCULT BLOOD, ED: FECAL OCCULT BLD: NEGATIVE

## 2017-08-23 MED ORDER — IOPAMIDOL (ISOVUE-300) INJECTION 61%
INTRAVENOUS | Status: AC
  Administered 2017-08-23: 100 mL
  Filled 2017-08-23: qty 100

## 2017-08-23 NOTE — ED Notes (Signed)
Patient verbalized understanding of discharge instructions and denies any further needs or questions at this time. VS stable. Patient ambulatory with steady gait.  

## 2017-08-23 NOTE — ED Notes (Signed)
Patient transported to CT 

## 2017-08-23 NOTE — ED Triage Notes (Signed)
Patient reports black stool X 3days, lower abdomen pain and N&V. Hypertensive in Triage

## 2017-08-23 NOTE — ED Provider Notes (Signed)
[place] EMERGENCY DEPARTMENT Provider Note   CSN: [id number] Arrival date & time: 08/23/17  1246     History   Chief Complaint Chief Complaint  Patient presents with  . Melena    HPI Tonya Wise is a 77 y.o. female.  Patient with recent influenza infection, improved. Now has dark stools concerning for melena.  Patient uses NSAIDs daily.    Abdominal Pain   This is a new problem. Episode onset: 3 days ago. The problem occurs constantly. The problem has not changed since onset.Associated with: recently had influenza. The pain is located in the LLQ. The quality of the pain is cramping and aching. The pain is moderate. Associated symptoms include melena, nausea and vomiting (x1 each day for 2 days). Pertinent negatives include fever, constipation, dysuria, hematuria, arthralgias and myalgias. Nothing aggravates the symptoms. Nothing relieves the symptoms.    Past Medical History:  Diagnosis Date  . Allergy   . Anemia   . Ankle fracture, left   . Chemotherapy induced cardiomyopathy ([place])    a. 08/2014: echo showing EF of 35-40% with Grade 2 DD and mild MR  . Diastolic dysfunction, grade I 04/12/2013  . GERD (gastroesophageal reflux disease)    Tums or Maalox  . Heart murmur   . Hypercalcemia 03/27/2013  . Hypertension   . Non-Hodgkins lymphoma ([place]) 04/06/2013  . Osteopenia   . Renal insufficiency     Patient Active Problem List   Diagnosis Date Noted  . Age-related osteoporosis without current pathological fracture 02/18/2017  . 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 Essential hypertension 09/17/2014  . Hyperlipidemia 09/17/2014  . Thrombocytopenia ([place]) 08/23/2013  . Cardiomyopathy ([place]) 08/12/2013  . Diastolic dysfunction grade 2 08/12/2013    Past Surgical History:  Procedure Laterality Date  . ABDOMINAL HYSTERECTOMY    . APPENDECTOMY    . BACK SURGERY     LOWER BACK TWICE  . COLONOSCOPY  2008   . ESOPHAGOGASTRODUODENOSCOPY N/A 03/29/2013   Procedure: ESOPHAGOGASTRODUODENOSCOPY (EGD);  Surgeon: Juanita Craver, MD;  Location[facility] ENDOSCOPY;  Service: Endoscopy;  Laterality: N/A;  . INGUINAL LYMPH NODE BIOPSY Right 03/31/2013   Procedure: INGUINAL LYMPH NODE BIOPSY;  Surgeon: Gwenyth Ober, MD;  Location: [place];  Service: General;  Laterality: Right;  . ORIF ANKLE FRACTURE Left 11/03/2015   Procedure: OPEN REDUCTION INTERNAL FIXATION (ORIF) LEFT ANKLE FRACTURE;  Surgeon: Wylene Simmer, MD;  Location: [place];  Service: Orthopedics;  Laterality: Left;  . SPINE SURGERY      OB History    No data available       Home Medications    Prior to Admission medications   Medication Sig Start Date End Date Taking? Authorizing Provider  aspirin EC 81 MG tablet Take 1 tablet (81 mg total) by mouth 2 (two) times daily. 11/03/15   Wylene Simmer, MD  cholecalciferol (VITAMIN D) 1000 units tablet Take 1,000 Units by mouth daily.    [provider]  cyanocobalamin 500 MCG tablet Take 1,000 mcg by mouth daily.    [provider]  HYDROcodone-homatropine (HYCODAN) 5-1.5 MG/5ML syrup Take 5 mLs by mouth every 8 (eight) hours as needed for cough. 08/19/17   Tysinger, Camelia Eng, PA-C  losartan-hydrochlorothiazide (HYZAAR) 100-12.5 MG tablet Take 1 tablet by mouth daily. 12/27/16   Denita Lung, MD  metoprolol tartrate (LOPRESSOR) 50 MG tablet Take 1 tablet (50 mg total) by mouth 2 (two) times daily. 12/27/16  Denita Lung, MD  Misc Natural Products (OSTEO BI-FLEX TRIPLE STRENGTH) TABS Take 1 tablet by mouth 2 (two) times daily.    [provider]    Family History Family History  Problem Relation Age of Onset  . Heart attack Father 45  . Leukemia Mother   . Diabetes Brother   . Diabetes Sister     Social History Social History   Tobacco Use  . Smoking status: Never Smoker  . Smokeless tobacco: Never Used  Substance Use Topics  . Alcohol use: No  . Drug  use: No     Allergies   Prednisone and Penicillins   Review of Systems Review of Systems  Constitutional: Negative for chills and fever.  HENT: Negative for ear pain and sore throat.   Eyes: Negative for pain and visual disturbance.  Respiratory: Negative for cough and shortness of breath.   Cardiovascular: Negative for chest pain and palpitations.  Gastrointestinal: Positive for abdominal pain, melena, nausea and vomiting (x1 each day for 2 days). Negative for constipation.  Genitourinary: Negative for dysuria and hematuria.  Musculoskeletal: Negative for arthralgias, back pain and myalgias.  Skin: Negative for color change and rash.  Neurological: Negative for seizures and syncope.  All other systems reviewed and are negative.    Physical Exam Updated Vital Signs BP (!) 186/83 (BP Location: Right Arm)   Pulse 64   Temp 97.6 F (36.4 C) (Oral)   Resp 16   Ht 5\' 3"  (1.6 m)   Wt 65.8 kg (145 lb)   SpO2 98%   BMI 25.69 kg/m   Physical Exam  Constitutional: She is oriented to person, place, and time. She appears well-developed and well-nourished. No distress.  HENT:  Head: Normocephalic and atraumatic.  Eyes: Conjunctivae and EOM are normal. Pupils are equal, round, and reactive to light.  Neck: Neck supple.  Cardiovascular: Normal rate and regular rhythm.  Pulmonary/Chest: Effort normal and breath sounds normal. No respiratory distress.  Abdominal: Soft. There is tenderness (llq). There is guarding.  Musculoskeletal: She exhibits no edema.  Neurological: She is alert and oriented to person, place, and time.  Skin: Skin is warm and dry.  Psychiatric: She has a normal mood and affect.  Nursing note and vitals reviewed.    ED Treatments / Results  Labs (all labs ordered are listed, but only abnormal results are displayed) Labs Reviewed  COMPREHENSIVE METABOLIC PANEL - Abnormal; Notable for the following components:      Result Value   Glucose, Bld 110 (*)     Creatinine, Ser 1.19 (*)    Total Protein 6.0 (*)    ALT 13 (*)    GFR calc non Af Amer 43 (*)    GFR calc Af Amer 50 (*)    All other components within normal limits  CBC - Abnormal; Notable for the following components:   RBC 5.14 (*)    Platelets 116 (*)    All other components within normal limits  POC OCCULT BLOOD, ED  TYPE AND SCREEN    EKG  EKG Interpretation None       Radiology Ct Abdomen Pelvis W Contrast  Result Date: 08/23/2017 CLINICAL DATA:  Black stools x3 days with lower abdominal pain, nausea and vomiting. EXAM: CT ABDOMEN AND PELVIS WITH CONTRAST TECHNIQUE: Multidetector CT imaging of the abdomen and pelvis was performed using the standard protocol following bolus administration of intravenous contrast. CONTRAST:  130mL ISOVUE-300 IOPAMIDOL (ISOVUE-300) INJECTION 61% COMPARISON:  06/29/2015 CT FINDINGS:  Lower chest: Normal heart size. No pericardial effusion. Clear lungs. Small hiatal hernia. Hepatobiliary: No focal liver abnormality is seen. No gallstones, gallbladder wall thickening, or biliary dilatation. Pancreas: A few coarse calcifications are seen in the neck of pancreas. No inflammatory change or enhancing mass. No ductal dilatation. Spleen: No splenomegaly or mass. Adrenals/Urinary Tract: Normal bilateral adrenal glands. Symmetric nephrograms and pyelograms. Mild cortical thinning in the right lower pole. No hydroureteronephrosis. No nephrolithiasis. Urinary bladder is physiologically distended without focal mural thickening or calculi. Stomach/Bowel: The patient is status post appendectomy. Contracted stomach with small hiatal hernia. Normal small bowel rotation without obstruction or inflammation. No annular constricting lesions. No mural thickening or inflammation of the colon. Vascular/Lymphatic: Aortoiliac atherosclerosis. No aneurysm. Patent portal, splenic, hepatic and renal veins. No lymphadenopathy. Reproductive: Small amount of air in the vaginal canal  outlining the cervix. Uterus and adnexa are normal. Other: Small amount of free fluid, likely physiologic in the pelvis. No free air. Musculoskeletal: L3 through L5 lumbar fusion with will post spinal decompressive changes at L4 and L5. Surgical clips project over the right inguinal region. No acute nor suspicious osseous lesions. IMPRESSION: No acute intraabdominal nor pelvic abnormality. No findings to explain the patient's black stools. Chronic stable changes as above. Electronically Signed   By: Ashley Royalty M.D.   On: 08/23/2017 19:26    Procedures Procedures (including critical care time)  Medications Ordered in ED Medications  iopamidol (ISOVUE-300) 61 % injection (100 mLs  Contrast Given 08/23/17 1858)     Initial Impression / Assessment and Plan / ED Course  I have reviewed the triage vital signs and the nursing notes.  Pertinent labs & imaging results that were available during my care of the patient were reviewed by me and considered in my medical decision making (see chart for details).     Tonya Wise is a 77 year old female with past medical history significant for non-Hodgkin's lymphoma in remission, GERD, anemia who presents for dark stools and abdominal pain.  Fecal occult testing is negative.  BUN is not elevated.  Creatinine at baseline.  Hemoglobin of 14.8.  CT abdomen/pelvis obtained and demonstrates no acute abnormalities.  Patient is discharged home with strict return precautions, follow-up instructions, and educational materials.  Final Clinical Impressions(s) / ED Diagnoses   Final diagnoses:  Dark stools  Left lower quadrant pain    ED Discharge Orders    None       Elveria Rising, MD 08/23/17 0932    Merrily Pew, MD 08/24/17 6712

## 2017-08-23 NOTE — ED Notes (Signed)
Patient ambulated to restroom and back with steady gait.

## 2017-08-24 LAB — BPAM RBC
BLOOD PRODUCT EXPIRATION DATE: 201902092359
Blood Product Expiration Date: 201902122359
Blood Product Expiration Date: 201902122359
ISSUE DATE / TIME: 201901161214
ISSUE DATE / TIME: 201901161214
Unit Type and Rh: 5100
Unit Type and Rh: 5100
Unit Type and Rh: 5100

## 2017-08-24 LAB — TYPE AND SCREEN
ABO/RH(D): B POS
ANTIBODY SCREEN: POSITIVE
UNIT DIVISION: 0
UNIT DIVISION: 0
Unit division: 0

## 2017-09-05 ENCOUNTER — Encounter: Payer: Self-pay | Admitting: Family Medicine

## 2017-09-05 ENCOUNTER — Ambulatory Visit (INDEPENDENT_AMBULATORY_CARE_PROVIDER_SITE_OTHER): Payer: Medicare Other | Admitting: Family Medicine

## 2017-09-05 VITALS — BP 142/82 | HR 70 | Wt 148.0 lb

## 2017-09-05 DIAGNOSIS — M1990 Unspecified osteoarthritis, unspecified site: Secondary | ICD-10-CM

## 2017-09-05 DIAGNOSIS — J3489 Other specified disorders of nose and nasal sinuses: Secondary | ICD-10-CM

## 2017-09-05 DIAGNOSIS — M545 Low back pain, unspecified: Secondary | ICD-10-CM

## 2017-09-05 DIAGNOSIS — G8929 Other chronic pain: Secondary | ICD-10-CM

## 2017-09-05 NOTE — Progress Notes (Signed)
   Subjective:    Patient ID: Tonya Wise, female    DOB: 1940-10-22, 77 y.o.   MRN: [id number]  HPI She has noted a lesion in the right anterior nose for the last week.  It is causing no pain or drainage.  She has no other concerns other than intermittent difficulty with back pain.  She does have a history of arthritis and has had previous back surgery.   Review of Systems     Objective:   Physical Exam Alert and in no distress.  A 0.5 cm lesion is noted in the right anterior medial nares.  It does not appear erythematous and is not bleeding.  Throat is clear.  Neck is supple without adenopathy.       Assessment & Plan:  Internal nasal lesion  Chronic low back pain without sciatica, unspecified back pain laterality - Plan: Ambulatory referral to Physical Therapy  Arthritis - Plan: Ambulatory referral to Physical Therapy Recommend watchful waiting concerning the nasal lesion over the next several weeks and if it does not go away come back for reevaluation and possible referral to ENT.  She was comfortable with this. We will also refer to physical therapy for good back rehab program.  Explained that the best way to take care of back pain is with physical therapy.

## 2017-09-12 ENCOUNTER — Encounter: Payer: Self-pay | Admitting: Physical Therapy

## 2017-09-12 ENCOUNTER — Ambulatory Visit: Payer: Medicare Other | Attending: Family Medicine | Admitting: Physical Therapy

## 2017-09-12 ENCOUNTER — Other Ambulatory Visit: Payer: Self-pay

## 2017-09-12 DIAGNOSIS — G8929 Other chronic pain: Secondary | ICD-10-CM | POA: Insufficient documentation

## 2017-09-12 DIAGNOSIS — M62830 Muscle spasm of back: Secondary | ICD-10-CM

## 2017-09-12 DIAGNOSIS — M545 Low back pain: Secondary | ICD-10-CM | POA: Diagnosis not present

## 2017-09-12 DIAGNOSIS — R293 Abnormal posture: Secondary | ICD-10-CM | POA: Insufficient documentation

## 2017-09-12 NOTE — Therapy (Signed)
[place], [place], [address] Phone: [phone]   Fax:  [phone]  Physical Therapy Evaluation  Patient Details  Name: Tonya Wise MRN: [id number] Date of Birth: 01-16-1941 Referring Provider: Denita Lung, MD   Encounter Date: 09/12/2017  PT End of Session - 09/12/17 0936    Visit Number  1    Number of Visits  13    Date for PT Re-Evaluation  10/24/17    PT Start Time  0845    PT Stop Time  0932    PT Time Calculation (min)  47 min    Activity Tolerance  Patient tolerated treatment well    Behavior During Therapy  [facility] for tasks assessed/performed       Past Medical History:  Diagnosis Date  . Allergy   . Anemia   . Ankle fracture, left   . Chemotherapy induced cardiomyopathy ([place])    a. 08/2014: echo showing EF of 35-40% with Grade 2 DD and mild MR  . Diastolic dysfunction, grade I 04/12/2013  . GERD (gastroesophageal reflux disease)    Tums or Maalox  . Heart murmur   . Hypercalcemia 03/27/2013  . Hypertension   . Non-Hodgkins lymphoma ([place]) 04/06/2013  . Osteopenia   . Renal insufficiency     Past Surgical History:  Procedure Laterality Date  . ABDOMINAL HYSTERECTOMY    . APPENDECTOMY    . BACK SURGERY     LOWER BACK TWICE  . COLONOSCOPY  2008  . ESOPHAGOGASTRODUODENOSCOPY N/A 03/29/2013   Procedure: ESOPHAGOGASTRODUODENOSCOPY (EGD);  Surgeon: Juanita Craver, MD;  Location: [facility] ENDOSCOPY;  Service: Endoscopy;  Laterality: N/A;  . INGUINAL LYMPH NODE BIOPSY Right 03/31/2013   Procedure: INGUINAL LYMPH NODE BIOPSY;  Surgeon: Gwenyth Ober, MD;  Location: [place];  Service: General;  Laterality: Right;  . ORIF ANKLE FRACTURE Left 11/03/2015   Procedure: OPEN REDUCTION INTERNAL FIXATION (ORIF) LEFT ANKLE FRACTURE;  Surgeon: Wylene Simmer, MD;  Location: [place];  Service: Orthopedics;  Laterality: Left;  . SPINE SURGERY      There were no vitals filed for this  visit.   Subjective Assessment - 09/12/17 0852    Subjective  pt is a 77 y.o with report of intermittent low back pain that started back in 2004 after her inital low back surgery. she had 2 back surgeries with the last being in 2005 reporing a fusion of the lumbar spine. Since onset the pain seems to get worse but can fluctuate. she denies any referral down the LE. Denies red flags.     Pertinent History  2004 and 2005 had 2 back surgeries and reports she had a fusion in the lower spine, hx or Cx    Limitations  Sitting laying down.    How long can you sit comfortably?  2 hours    How long can you stand comfortably?  unlimited    How long can you walk comfortably?  unlimited    Diagnostic tests  nothing recently    Patient Stated Goals  to be able to overcome pain and improve strength/ endurance.    Currently in Pain?  Yes    Pain Score  7  at worst 10/10.     Pain Location  Back    Pain Orientation  Lower;Right    Pain Descriptors / Indicators  Burning;Sharp deep    Pain Type  Chronic pain    Pain Onset  More than a month  ago    Pain Frequency  Intermittent    Aggravating Factors   prolonged sitting, and laying down     Pain Relieving Factors  walking, standing, topical ointment,          [facility] PT Assessment - 09/12/17 0849      Assessment   Medical Diagnosis  Low back pain    Referring Provider  Denita Lung, MD    Onset Date/Surgical Date  -- 2004     Hand Dominance  Right    Next MD Visit  make on PRN    Prior Therapy  yes previous Cx PT, and ortho for back      Precautions   Precautions  None      Restrictions   Weight Bearing Restrictions  No      Balance Screen   Has the patient fallen in the past 6 months  No    Has the patient had a decrease in activity level because of a fear of falling?   No    Is the patient reluctant to leave their home because of a fear of falling?   No      Home Environment   Living Environment  Private residence    Living  Arrangements  Spouse/significant other    Available Help at Discharge  Family;Available PRN/intermittently    Type of Home  House    Home Access  Stairs to enter    Entrance Stairs-Number of Steps  4    Entrance Stairs-Rails  None    Home Layout  Two level    Alternate Level Stairs-Number of Steps  20    Alternate Level Stairs-Rails  -- both but unable to reach both    [place] - 2 wheels;Tub bench;Cane - single point;Shower seat;Bedside commode      Prior Function   Level of Independence  Independent    Vocation  Retired    Leisure  walking, playing golf, gardening.       Cognition   Overall Cognitive Status  Within Functional Limits for tasks assessed      Observation/Other Assessments   Focus on Therapeutic Outcomes (FOTO)   42% limited predicted 37% limited      Posture/Postural Control   Posture/Postural Control  Postural limitations    Postural Limitations  Rounded Shoulders;Forward head;Decreased lumbar lordosis      ROM / Strength   AROM / PROM / Strength  AROM;Strength      AROM   AROM Assessment Site  Lumbar    Lumbar Flexion  68 End range tightness    Lumbar Extension  10 ERP    Lumbar - Right Side Bend  28    Lumbar - Left Side Bend  28      Strength   Overall Strength Comments  overrall within function limited with the exception of 4-/5 with bil abduction testing    Strength Assessment Site  Hip;Knee    Right/Left Hip  Right;Left    Right/Left Knee  Right;Left      Palpation   Palpation comment  TTP along R lumbar paraspinals, R PSIS and along glute medius      Special Tests    Special Tests  Lumbar;Sacrolliac Tests    Other special tests  forward flexion testing (+) with decreased R limited superior movement.  gillets (-)     Sacroiliac Tests   Gaenslen's Test      Gaenslen's test   Findings  Positive  Side   Right    Comments  pain during testing noted at PSIS      Ambulation/Gait   Ambulation/Gait  Yes    Gait Pattern   Step-through pattern;Within Functional Limits             Objective measurements completed on examination: See above findings.      [facility] Adult PT Treatment/Exercise - 09/12/17 0849      Knee/Hip Exercises: Stretches   Active Hamstring Stretch  3 reps;30 seconds;Right PNF contract/ relax with 10 sec contraction    Active Hamstring Stretch Limitations  2 x 30 sec hold in sitting    Other Knee/Hip Stretches  lower trunk rotation 2 x 10 holding end range x 3 sec      Manual Therapy   Manual Therapy  Muscle Energy Technique    Muscle Energy Technique  resisted R hip flexion 5 x 10 sec hold how to perform at home             PT Education - 09/12/17 0925    Education provided  Yes    Education Details  evaluation findings. POC, goals with HEP/ form. Anatomy of the low back and SIJ as well as effects.    Person(s) Educated  Patient    Methods  Explanation;Verbal cues;Handout    Comprehension  Verbalized understanding;Verbal cues required       PT Short Term Goals - 09/12/17 0944      PT SHORT TERM GOAL #1   Title  pt to be I with inital HEP given     Time  3    Period  Weeks    Status  New    Target Date  10/03/17        PT Long Term Goals - 09/12/17 0944      PT LONG TERM GOAL #1   Title  pt to verbalize and demo proper posture in various positions, and utilize approprate lifting techniques to prevent/ reduce low back pain    Time  6    Period  Weeks    Status  New    Target Date  10/24/17      PT LONG TERM GOAL #2   Title  pt will be able to peform trunk motions WFL reporting </= 2/10 pain     Time  6    Period  Weeks    Status  New    Target Date  10/24/17      PT LONG TERM GOAL #3   Title  pt to be able to return to gardening and playing golf reporting </= 2/10 pain in the low back for pt's personal goals    Time  6    Period  Weeks    Status  New    Target Date  10/24/17      PT LONG TERM GOAL #4   Title  Increase FOTO score to </= 37%  limited to demo improvement in function     Time  6    Period  Weeks    Status  New    Target Date  10/24/17      PT LONG TERM GOAL #5   Title  pt to be I with all HEP given to maintain and promote current level of function    Time  6    Period  Weeks    Status  New    Target Date  10/24/17  Plan - 09/12/17 0937    Clinical Impression Statement  pt present to OPPT with CC of chronic R low back pain that started back in 2004 following her inital low back surgery and had another in 2005 per pt report. Strength in bil Le is [facility] and trunk mobility is [facility] but with End range pain and muscle tightness. TTP along R lumbar paraspinals and at the R PSIS. special testing is positive for high likely hood of posteriorly rotated innominate on the R. Following hamstring stretching and MET techniques she reported relief of pain to 0/10. She would benefit from physical therapy to Reduce low back spasm/ pain, improve pelvic stability/ core strength, promote appropriate posture and lifting techniques and maximize her function by addressing the deficits listed.     History and Personal Factors relevant to plan of care:  PSH of  2 back surgeries, PMHx of Cx and osteopenia    Clinical Presentation  Evolving    Clinical Presentation due to:  R low back pain, muscle spasm, abnormal posture, Fluctuating symptomology.     Clinical Decision Making  Moderate    Rehab Potential  Good    PT Frequency  2x / week    PT Duration  6 weeks    PT Treatment/Interventions  ADLs/Self Care Home Management;Cryotherapy;Balance training;Patient/family education;Dry needling;Taping;Manual techniques;Therapeutic exercise;Therapeutic activities    PT Next Visit Plan  (hx of CX: no heat / e-stim unless specifically signed off by oncologist) Review HEP/ update PRN: R posteriorly rotated innominate: hamstring stretch, hip flexor activation, core strengthening. posture education    PT Home Exercise Plan  hamstring  stretching (seated and supine), hip flexor MET on R, lower trunk rotation.     Consulted and Agree with Plan of Care  Patient       Patient will benefit from skilled therapeutic intervention in order to improve the following deficits and impairments:  Abnormal gait, Pain, Postural dysfunction, Improper body mechanics, Decreased activity tolerance, Decreased endurance, Increased muscle spasms  Visit Diagnosis: Chronic right-sided low back pain without sciatica  Muscle spasm of back  Abnormal posture     Problem List Patient Active Problem List   Diagnosis Date Noted  . Age-related osteoporosis without current pathological fracture 02/18/2017  . 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 Essential hypertension 09/17/2014  . Hyperlipidemia 09/17/2014  . Thrombocytopenia ([place]) 08/23/2013  . Cardiomyopathy ([place]) 08/12/2013  . Diastolic dysfunction grade 2 08/12/2013   Starr [place] PT, DPT, LAT, ATC  09/12/17  9:52 AM      [facility] [address] [place], [place], [address] Phone: [phone]   Fax:  [phone]  Name: JAIDEN WAHAB MRN: [id number] Date of Birth: 01/08/1941

## 2017-09-13 DIAGNOSIS — H35033 Hypertensive retinopathy, bilateral: Secondary | ICD-10-CM | POA: Diagnosis not present

## 2017-09-13 DIAGNOSIS — H3561 Retinal hemorrhage, right eye: Secondary | ICD-10-CM | POA: Diagnosis not present

## 2017-09-13 DIAGNOSIS — H35372 Puckering of macula, left eye: Secondary | ICD-10-CM | POA: Diagnosis not present

## 2017-09-13 DIAGNOSIS — Z961 Presence of intraocular lens: Secondary | ICD-10-CM | POA: Diagnosis not present

## 2017-09-18 ENCOUNTER — Telehealth: Payer: Self-pay

## 2017-09-18 NOTE — Telephone Encounter (Signed)
Pt called to let office know that she had requested a back and right knee brace from [facility] . Pt say all they need is a script for the back and knee brace. Pt also say they will contact our office. Thanks [employer]

## 2017-09-20 ENCOUNTER — Telehealth: Payer: Self-pay

## 2017-09-20 ENCOUNTER — Telehealth: Payer: Self-pay | Admitting: Family Medicine

## 2017-09-20 NOTE — Telephone Encounter (Signed)
Called pt to see if she wanted to know if she wanted to come in to be eval  for knee and back brace . Pt say she will think about it. She is doing therapy now and just cant seem  to get pass the back pain. Has apt with them this Monday.

## 2017-09-20 NOTE — Telephone Encounter (Signed)
Meredith at [facility] called to let us know that she reached out to pt to do a welcome call for Occupational Therapy but the family told her that pt is receiving outpt therapy so pt is not eligible for in home therapy right now.

## 2017-09-23 ENCOUNTER — Ambulatory Visit: Payer: Medicare Other | Admitting: Physical Therapy

## 2017-09-23 ENCOUNTER — Encounter: Payer: Self-pay | Admitting: Physical Therapy

## 2017-09-23 DIAGNOSIS — R293 Abnormal posture: Secondary | ICD-10-CM

## 2017-09-23 DIAGNOSIS — M62830 Muscle spasm of back: Secondary | ICD-10-CM | POA: Diagnosis not present

## 2017-09-23 DIAGNOSIS — M545 Low back pain: Principal | ICD-10-CM

## 2017-09-23 DIAGNOSIS — G8929 Other chronic pain: Secondary | ICD-10-CM | POA: Diagnosis not present

## 2017-09-23 NOTE — Patient Instructions (Addendum)
At home lie on left side for 10 minutes for Right quadratus lumborum stretch with pillow under torso.  Handout provided    Information about quadratus lumborum on handout and radiating trigger points.  Sleeping on Back  Place pillow under knees. A pillow with cervical support and a roll around waist are also helpful. Copyright  VHI. All rights reserved.  Sleeping on Side Place pillow between knees. Use cervical support under neck and a roll around waist as needed. Copyright  VHI. All rights reserved.   Sleeping on Stomach   If this is the only desirable sleeping position, place pillow under lower legs, and under stomach or chest as needed.  Posture - Sitting   Sit upright, head facing forward. Try using a roll to support lower back. Keep shoulders relaxed, and avoid rounded back. Keep hips level with knees. Avoid crossing legs for long periods. Stand to Sit / Sit to Stand   To sit: Bend knees to lower self onto front edge of chair, then scoot back on seat. To stand: Reverse sequence by placing one foot forward, and scoot to front of seat. Use rocking motion to stand up.   Work Height and Reach  Ideal work height is no more than 2 to 4 inches below elbow level when standing, and at elbow level when sitting. Reaching should be limited to arm's length, with elbows slightly bent.  Bending  Bend at hips and knees, not back. Keep feet shoulder-width apart.    Posture - Standing   Good posture is important. Avoid slouching and forward head thrust. Maintain curve in low back and align ears over shoul- ders, hips over ankles.  Alternating Positions   Alternate tasks and change positions frequently to reduce fatigue and muscle tension. Take rest breaks. Computer Work   Position work to Programmer, multimedia. Use proper work and seat height. Keep shoulders back and down, wrists straight, and elbows at right angles. Use chair that provides full back support. Add footrest and lumbar roll as  needed.  Getting Into / Out of Car  Lower self onto seat, scoot back, then bring in one leg at a time. Reverse sequence to get out.  Dressing  Lie on back to pull socks or slacks over feet, or sit and bend leg while keeping back straight.    Housework - Sink  Place one foot on ledge of cabinet under sink when standing at sink for prolonged periods.   Pushing / Pulling  Pushing is preferable to pulling. Keep back in proper alignment, and use leg muscles to do the work.  Deep Squat   Squat and lift with both arms held against upper trunk. Tighten stomach muscles without holding breath. Use smooth movements to avoid jerking.  Avoid Twisting   Avoid twisting or bending back. Pivot around using foot movements, and bend at knees if needed when reaching for articles.  Carrying Luggage   Distribute weight evenly on both sides. Use a cart whenever possible. Do not twist trunk. Move body as a unit.   Lifting Principles .Maintain proper posture and head alignment. .Slide object as close as possible before lifting. .Move obstacles out of the way. .Test before lifting; ask for help if too heavy. .Tighten stomach muscles without holding breath. .Use smooth movements; do not jerk. .Use legs to do the work, and pivot with feet. .Distribute the work load symmetrically and close to the center of trunk. .Push instead of pull whenever possible.   Ask For Help  Ask for help and delegate to others when possible. Coordinate your movements when lifting together, and maintain the low back curve.  Log Roll   Lying on back, bend left knee and place left arm across chest. Roll all in one movement to the right. Reverse to roll to the left. Always move as one unit. Housework - Sweeping  Use long-handled equipment to avoid stooping.   Housework - Wiping  Position yourself as close as possible to reach work surface. Avoid straining your back.  Laundry - Unloading Wash   To unload  small items at bottom of washer, lift leg opposite to arm being used to reach.  [place] close to area to be raked. Use arm movements to do the work. Keep back straight and avoid twisting.     Cart  When reaching into cart with one arm, lift opposite leg to keep back straight.   Getting Into / Out of Bed  Lower self to lie down on one side by raising legs and lowering head at the same time. Use arms to assist moving without twisting. Bend both knees to roll onto back if desired. To sit up, start from lying on side, and use same move-ments in reverse. Housework - Vacuuming  Hold the vacuum with arm held at side. Step back and forth to move it, keeping head up. Avoid twisting.   Laundry - IT consultant so that bending and twisting can be avoided.   Laundry - Unloading Dryer  Squat down to reach into clothes dryer or use a reacher.  Gardening - Weeding / Probation officer or Kneel. Knee pads may be helpful.                    Tonya Wise, PT Certified Exercise Expert for the Aging Adult  09/23/17 2:39 PM Phone: [phone] Fax: [phone]

## 2017-09-23 NOTE — Therapy (Signed)
[place], [place], [address] Phone: [phone]   Fax:  [phone]  Physical Therapy Treatment  Patient Details  Name: Tonya Wise MRN: [id number] Date of Birth: 02/18/1941 Referring Provider: Denita Lung, MD   Encounter Date: 09/23/2017  PT End of Session - 09/23/17 1444    Visit Number  2    Number of Visits  13    Date for PT Re-Evaluation  10/24/17    PT Start Time  1416    PT Stop Time  1500    PT Time Calculation (min)  44 min    Activity Tolerance  Patient tolerated treatment well    Behavior During Therapy  [facility] for tasks assessed/performed       Past Medical History:  Diagnosis Date  . Allergy   . Anemia   . Ankle fracture, left   . Chemotherapy induced cardiomyopathy ([place])    a. 08/2014: echo showing EF of 35-40% with Grade 2 DD and mild MR  . Diastolic dysfunction, grade I 04/12/2013  . GERD (gastroesophageal reflux disease)    Tums or Maalox  . Heart murmur   . Hypercalcemia 03/27/2013  . Hypertension   . Non-Hodgkins lymphoma ([place]) 04/06/2013  . Osteopenia   . Renal insufficiency     Past Surgical History:  Procedure Laterality Date  . ABDOMINAL HYSTERECTOMY    . APPENDECTOMY    . BACK SURGERY     LOWER BACK TWICE  . COLONOSCOPY  2008  . ESOPHAGOGASTRODUODENOSCOPY N/A 03/29/2013   Procedure: ESOPHAGOGASTRODUODENOSCOPY (EGD);  Surgeon: Juanita Craver, MD;  [facility]: [facility] ENDOSCOPY;  Service: Endoscopy;  Laterality: N/A;  . INGUINAL LYMPH NODE BIOPSY Right 03/31/2013   Procedure: INGUINAL LYMPH NODE BIOPSY;  Surgeon: Gwenyth Ober, MD;  Location: [place];  Service: General;  Laterality: Right;  . ORIF ANKLE FRACTURE Left 11/03/2015   Procedure: OPEN REDUCTION INTERNAL FIXATION (ORIF) LEFT ANKLE FRACTURE;  Surgeon: Wylene Simmer, MD;  Location: [place];  Service: Orthopedics;  Laterality: Left;  . SPINE SURGERY      There were no vitals filed for this  visit.  Subjective Assessment - 09/23/17 1422    Subjective  I have been doing my exercises , My back is better but on my right side I am still tender to touch    Pertinent History  2004 and 2005 had 2 back surgeries and reports she had a fusion in the lower spine, hx or Cx    Patient Stated Goals  to be able to overcome pain and improve strength/ endurance.    Currently in Pain?  Yes    Pain Score  6     Pain Location  Back    Pain Orientation  Lower;Right    Pain Descriptors / Indicators  Burning;Sharp    Pain Type  Chronic pain    Pain Onset  More than a month ago    Pain Frequency  Intermittent                      [facility] Adult PT Treatment/Exercise - 09/23/17 1424      Self-Care   Self-Care  Other Self-Care Comments;Posture;ADL's;Lifting    ADL's  went over handout and special instruction for osteoporosis    Lifting  pt demo lifting of stool with proper techniques    Posture  educated on posture and technique to perform household chores    Other Self-Care Comments  sidelying QL stretch.  use of pillows for comfort and sleep      Lumbar Exercises: Sidelying   Other Sidelying Lumbar Exercises  left sidelying Quadratus lumborum stretch for 5 minutes with added Myofascial stretch by PT to right Quadratus lumborum       Knee/Hip Exercises: Stretches   Active Hamstring Stretch  Both;4 reps;30 seconds R -2 L 2x    Other Knee/Hip Stretches  lower trunk rotation 2 x 10 holding end range x 3 sec      Knee/Hip Exercises: Supine   Bridges with Ball Squeeze  Strengthening;Both;10 reps;2 sets    Other Supine Knee/Hip Exercises  self MET with right Hamstring isometric for R inominate Outflare  5 x 10 sec hold      Manual Therapy   Manual Therapy  Muscle Energy Technique;Myofascial release    Manual therapy comments  Muscle energy hip resisted  abd/add x 3 and then resisted  right hamstring x 3 with right hip distraction    Myofascial Release  right QL               PT Education - 09/23/17 1445    Education provided  Yes    Education Details  reviewed HEP and added bridge and gave out information /education about posture, body mechanics and lifting    Person(s) Educated  Patient    Methods  Explanation;Demonstration;Tactile cues;Verbal cues;Handout    Comprehension  Verbalized understanding;Returned demonstration       PT Short Term Goals - 09/12/17 0944      PT SHORT TERM GOAL #1   Title  pt to be I with inital HEP given     Time  3    Period  Weeks    Status  New    Target Date  10/03/17        PT Long Term Goals - 09/12/17 0944      PT LONG TERM GOAL #1   Title  pt to verbalize and demo proper posture in various positions, and utilize approprate lifting techniques to prevent/ reduce low back pain    Time  6    Period  Weeks    Status  New    Target Date  10/24/17      PT LONG TERM GOAL #2   Title  pt will be able to peform trunk motions WFL reporting </= 2/10 pain     Time  6    Period  Weeks    Status  New    Target Date  10/24/17      PT LONG TERM GOAL #3   Title  pt to be able to return to gardening and playing golf reporting </= 2/10 pain in the low back for pt's personal goals    Time  6    Period  Weeks    Status  New    Target Date  10/24/17      PT LONG TERM GOAL #4   Title  Increase FOTO score to </= 37% limited to demo improvement in function     Time  6    Period  Weeks    Status  New    Target Date  10/24/17      PT LONG TERM GOAL #5   Title  pt to be I with all HEP given to maintain and promote current level of function    Time  6    Period  Weeks    Status  New  Target Date  10/24/17            Plan - 09/23/17 1420    Clinical Impression Statement  Pt presents for 2nd visit to OPPT with improvement from 7/10 to 6/10 pain in low back.  Pt reports 3/10 pain after session with right Quadratus lumbourm stetch and soft tissue/ myofascial release.  Will continue to work on posture  and strengthenign. increase pelvis stability, Sacral stabilty. Also educated and gave handouts for body mechanics, posture and ADL's    Rehab Potential  Good    PT Frequency  2x / week    PT Duration  6 weeks    PT Treatment/Interventions  ADLs/Self Care Home Management;Cryotherapy;Balance training;Patient/family education;Dry needling;Taping;Manual techniques;Therapeutic exercise;Therapeutic activities    PT Next Visit Plan  (hx of CX: no heat / e-stim unless specifically signed off by oncologist) Review HEP/ update PRN: R posteriorly rotated innominate: hamstring stretch, hip flexor activation, core strengthening. posture education    PT Home Exercise Plan  hamstring stretching (seated and supine), hip flexor MET on R, lower trunk rotation. right Quadratus lumborum stretching     Consulted and Agree with Plan of Care  Patient       Patient will benefit from skilled therapeutic intervention in order to improve the following deficits and impairments:  Abnormal gait, Pain, Postural dysfunction, Improper body mechanics, Decreased activity tolerance, Decreased endurance, Increased muscle spasms  Visit Diagnosis: Chronic right-sided low back pain without sciatica  Muscle spasm of back  Abnormal posture     Problem List Patient Active Problem List   Diagnosis Date Noted  . Age-related osteoporosis without current pathological fracture 02/18/2017  . 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 Essential hypertension 09/17/2014  . Hyperlipidemia 09/17/2014  . Thrombocytopenia ([place]) 08/23/2013  . Cardiomyopathy ([place]) 08/12/2013  . Diastolic dysfunction grade 2 08/12/2013    Voncille Lo, PT Certified Exercise Expert for the Aging Adult  09/23/17 6:30 PM Phone: [phone] Fax: [facility] [address] [place], [place], [address] Phone: [phone]   Fax:  [phone]  Name:  Tonya Wise MRN: [id number] Date of Birth: [birth year]

## 2017-09-24 ENCOUNTER — Telehealth: Payer: Self-pay | Admitting: Family Medicine

## 2017-09-24 NOTE — Telephone Encounter (Signed)
Recv'd fax regarding knee brace, Maudie Mercury called pt on 09/20/17 see telephone call, she will let us know if she decides to come in for evaluation Form in accordion folder

## 2017-09-25 ENCOUNTER — Ambulatory Visit: Payer: Medicare Other | Admitting: Physical Therapy

## 2017-09-25 ENCOUNTER — Encounter: Payer: Self-pay | Admitting: Physical Therapy

## 2017-09-25 DIAGNOSIS — M62830 Muscle spasm of back: Secondary | ICD-10-CM

## 2017-09-25 DIAGNOSIS — R293 Abnormal posture: Secondary | ICD-10-CM

## 2017-09-25 DIAGNOSIS — M545 Low back pain: Secondary | ICD-10-CM | POA: Diagnosis not present

## 2017-09-25 DIAGNOSIS — G8929 Other chronic pain: Secondary | ICD-10-CM

## 2017-09-25 NOTE — Therapy (Signed)
[place], [place], [address] Phone: [phone]   Fax:  [phone]  Physical Therapy Treatment  Patient Details  Name: Tonya Wise MRN: [id number] Date of Birth: 03-04-1941 Referring Provider: Denita Lung, MD   Encounter Date: 09/25/2017  PT End of Session - 09/25/17 0948    Visit Number  3    Number of Visits  13    Date for PT Re-Evaluation  10/24/17    PT Start Time  0930    PT Stop Time  1015    PT Time Calculation (min)  45 min       Past Medical History:  Diagnosis Date  . Allergy   . Anemia   . Ankle fracture, left   . Chemotherapy induced cardiomyopathy ([place])    a. 08/2014: echo showing EF of 35-40% with Grade 2 DD and mild MR  . Diastolic dysfunction, grade I 04/12/2013  . GERD (gastroesophageal reflux disease)    Tums or Maalox  . Heart murmur   . Hypercalcemia 03/27/2013  . Hypertension   . Non-Hodgkins lymphoma ([place]) 04/06/2013  . Osteopenia   . Renal insufficiency     Past Surgical History:  Procedure Laterality Date  . ABDOMINAL HYSTERECTOMY    . APPENDECTOMY    . BACK SURGERY     LOWER BACK TWICE  . COLONOSCOPY  2008  . ESOPHAGOGASTRODUODENOSCOPY N/A 03/29/2013   Procedure: ESOPHAGOGASTRODUODENOSCOPY (EGD);  Surgeon: Juanita Craver, MD;  Location: [facility] ENDOSCOPY;  Service: Endoscopy;  Laterality: N/A;  . INGUINAL LYMPH NODE BIOPSY Right 03/31/2013   Procedure: INGUINAL LYMPH NODE BIOPSY;  Surgeon: Gwenyth Ober, MD;  Location: [place];  Service: General;  Laterality: Right;  . ORIF ANKLE FRACTURE Left 11/03/2015   Procedure: OPEN REDUCTION INTERNAL FIXATION (ORIF) LEFT ANKLE FRACTURE;  Surgeon: Wylene Simmer, MD;  Location: [place];  Service: Orthopedics;  Laterality: Left;  . SPINE SURGERY      There were no vitals filed for this visit.  Subjective Assessment - 09/25/17 0933    Subjective  This morning I was in alot of pain 8/10. Using pillow betwen my knees  to sleep  at night.     Pain Score  5     Pain Location  Back    Pain Orientation  Right;Lower    Pain Descriptors / Indicators  -- there is something there          [facility] PT Assessment - 09/25/17 0001      Special Tests   Other special tests  Leg length 81.5cm  Rt 83cm Left                  [facility] Adult PT Treatment/Exercise - 09/25/17 0001      Self-Care   Self-Care  Other Self-Care Comments    Other Self-Care Comments   LLD / Heel lift -gradual increased in wear time      Lumbar Exercises: Stretches   Piriformis Stretch  2 reps;20 seconds    Figure 4 Stretch  20 seconds      Lumbar Exercises: Sidelying   Other Sidelying Lumbar Exercises  left sidelying Quadratus lumborum stretch for 5 minutes with added Myofascial stretch by PT to right Quadratus lumborum       Knee/Hip Exercises: Supine   Bridges  10 reps    Bridges with [employer]  Strengthening;Both;10 reps;2 [employer] with Clamshell  10 reps red band  Other Supine Knee/Hip Exercises  red band clam             PT Education - 09/25/17 1056    Education provided  Yes    Education Details  HEP    Person(s) Educated  Patient    Methods  Explanation;Handout    Comprehension  Verbalized understanding       PT Short Term Goals - 09/12/17 0944      PT SHORT TERM GOAL #1   Title  pt to be I with inital HEP given     Time  3    Period  Weeks    Status  New    Target Date  10/03/17        PT Long Term Goals - 09/12/17 0944      PT LONG TERM GOAL #1   Title  pt to verbalize and demo proper posture in various positions, and utilize approprate lifting techniques to prevent/ reduce low back pain    Time  6    Period  Weeks    Status  New    Target Date  10/24/17      PT LONG TERM GOAL #2   Title  pt will be able to peform trunk motions WFL reporting </= 2/10 pain     Time  6    Period  Weeks    Status  New    Target Date  10/24/17      PT LONG TERM GOAL #3   Title  pt to be  able to return to gardening and playing golf reporting </= 2/10 pain in the low back for pt's personal goals    Time  6    Period  Weeks    Status  New    Target Date  10/24/17      PT LONG TERM GOAL #4   Title  Increase FOTO score to </= 37% limited to demo improvement in function     Time  6    Period  Weeks    Status  New    Target Date  10/24/17      PT LONG TERM GOAL #5   Title  pt to be I with all HEP given to maintain and promote current level of function    Time  6    Period  Weeks    Status  New    Target Date  10/24/17            Plan - 09/25/17 0950    Clinical Impression Statement  Standing Left iliac crest higher, supine she is level. LLD measures 1.5cm longer on left. Full heel lift added to right shoe. Began pelvic stabilization exercises and continued trunk stretching. Pt felt good at end of session.    PT Next Visit Plan  Check tolerance to heel lift-PT reassess leg length, (hx of CX: no heat / e-stim unless specifically signed off by oncologist) Review HEP/ update PRN: R posteriorly rotated innominate: hamstring stretch, hip flexor activation, core strengthening. posture education    PT Home Exercise Plan  hamstring stretching (seated and supine), hip flexor MET on R, lower trunk rotation. right Quadratus lumborum stretching , figure 4 and piriformis stretch    Consulted and Agree with Plan of Care  Patient       Patient will benefit from skilled therapeutic intervention in order to improve the following deficits and impairments:  Abnormal gait, Pain, Postural dysfunction, Improper body mechanics, Decreased activity tolerance, Decreased  endurance, Increased muscle spasms  Visit Diagnosis: Chronic right-sided low back pain without sciatica  Muscle spasm of back  Abnormal posture     Problem List Patient Active Problem List   Diagnosis Date Noted  . Age-related osteoporosis without current pathological fracture 02/18/2017  . Diffuse large B-cell  lymphoma of intra-abdominal lymph nodes ([place]) 12/21/2015  . Essential hypertension 09/17/2014  . Hyperlipidemia 09/17/2014  . Thrombocytopenia ([place]) 08/23/2013  . Cardiomyopathy ([place]) 08/12/2013  . Diastolic dysfunction grade 2 08/12/2013    Dorene Ar, PTA 09/25/2017, 10:59 AM  [place], [place], [address] Phone: [phone]   Fax:  [phone]  Name: GILLIE FLEITES MRN: [id number] Date of Birth: 1941/03/04

## 2017-09-30 ENCOUNTER — Encounter: Payer: Self-pay | Admitting: Physical Therapy

## 2017-09-30 ENCOUNTER — Ambulatory Visit: Payer: Medicare Other | Attending: Family Medicine | Admitting: Physical Therapy

## 2017-09-30 DIAGNOSIS — G8929 Other chronic pain: Secondary | ICD-10-CM | POA: Diagnosis not present

## 2017-09-30 DIAGNOSIS — M62830 Muscle spasm of back: Secondary | ICD-10-CM | POA: Diagnosis not present

## 2017-09-30 DIAGNOSIS — R293 Abnormal posture: Secondary | ICD-10-CM | POA: Diagnosis not present

## 2017-09-30 DIAGNOSIS — M545 Low back pain: Secondary | ICD-10-CM | POA: Diagnosis not present

## 2017-09-30 NOTE — Therapy (Signed)
[place], [place], [address] Phone: [phone]   Fax:  [phone]  Physical Therapy Treatment  Patient Details  Name: Tonya Wise MRN: [id number] Date of Birth: 06-01-1941 Referring Provider: Denita Lung, MD   Encounter Date: 09/30/2017  PT End of Session - 09/30/17 1050    Visit Number  4    Number of Visits  13    Date for PT Re-Evaluation  10/24/17    PT Start Time  1016    PT Stop Time  1059    PT Time Calculation (min)  43 min    Activity Tolerance  Patient tolerated treatment well    Behavior During Therapy  [facility] for tasks assessed/performed       Past Medical History:  Diagnosis Date  . Allergy   . Anemia   . Ankle fracture, left   . Chemotherapy induced cardiomyopathy ([place])    a. 08/2014: echo showing EF of 35-40% with Grade 2 DD and mild MR  . Diastolic dysfunction, grade I 04/12/2013  . GERD (gastroesophageal reflux disease)    Tums or Maalox  . Heart murmur   . Hypercalcemia 03/27/2013  . Hypertension   . Non-Hodgkins lymphoma ([place]) 04/06/2013  . Osteopenia   . Renal insufficiency     Past Surgical History:  Procedure Laterality Date  . ABDOMINAL HYSTERECTOMY    . APPENDECTOMY    . BACK SURGERY     LOWER BACK TWICE  . COLONOSCOPY  2008  . ESOPHAGOGASTRODUODENOSCOPY N/A 03/29/2013   Procedure: ESOPHAGOGASTRODUODENOSCOPY (EGD);  Surgeon: Juanita Craver, MD;  [facility]: [facility] ENDOSCOPY;  Service: Endoscopy;  Laterality: N/A;  . INGUINAL LYMPH NODE BIOPSY Right 03/31/2013   Procedure: INGUINAL LYMPH NODE BIOPSY;  Surgeon: Gwenyth Ober, MD;  Location: [place];  Service: General;  Laterality: Right;  . ORIF ANKLE FRACTURE Left 11/03/2015   Procedure: OPEN REDUCTION INTERNAL FIXATION (ORIF) LEFT ANKLE FRACTURE;  Surgeon: Wylene Simmer, MD;  Location: [place];  Service: Orthopedics;  Laterality: Left;  . SPINE SURGERY      There were no vitals filed for this  visit.  Subjective Assessment - 09/30/17 1018    Subjective  "things are going good, I still have one tender place in my R low back"    Currently in Pain?  Yes    Pain Score  4     Pain Location  Back    Pain Orientation  Right;Lower    Pain Onset  More than a month ago    Pain Frequency  Intermittent    Aggravating Factors   nothing specific    Pain Relieving Factors  ibuprofen, standing/ walking, ointment                       [facility] Adult PT Treatment/Exercise - 09/30/17 1022      Lumbar Exercises: Stretches   Other Lumbar Stretch Exercise  QL stretching on R 2 x 30 sec hold      Lumbar Exercises: Supine   Dead Bug  -- 4 x 15 sec hold      Lumbar Exercises: Sidelying   Hip Abduction  Right;15 reps in L sidelying      Knee/Hip Exercises: Aerobic   Nustep  L5 x 5 min UE/LE      Knee/Hip Exercises: Seated   Sit to Sand  without UE support 2 x 10 from table      Manual Therapy  Manual Therapy  Joint mobilization    Manual therapy comments  MTPR along the R QL x 3    Joint Mobilization  grade 4 long axis distraction on RLE, inferior innominate grade 4 mobs              PT Education - 09/30/17 1049    Education provided  Yes    Education Details  updated HEP for hip strengthening, anatomy of the SIJ and effects of muscles on the SIJ    Person(s) Educated  Patient    Methods  Explanation;Verbal cues;Handout    Comprehension  Verbalized understanding       PT Short Term Goals - 09/12/17 0944      PT SHORT TERM GOAL #1   Title  pt to be I with inital HEP given     Time  3    Period  Weeks    Status  New    Target Date  10/03/17        PT Long Term Goals - 09/12/17 0944      PT LONG TERM GOAL #1   Title  pt to verbalize and demo proper posture in various positions, and utilize approprate lifting techniques to prevent/ reduce low back pain    Time  6    Period  Weeks    Status  New    Target Date  10/24/17      PT LONG TERM GOAL #2    Title  pt will be able to peform trunk motions WFL reporting </= 2/10 pain     Time  6    Period  Weeks    Status  New    Target Date  10/24/17      PT LONG TERM GOAL #3   Title  pt to be able to return to gardening and playing golf reporting </= 2/10 pain in the low back for pt's personal goals    Time  6    Period  Weeks    Status  New    Target Date  10/24/17      PT LONG TERM GOAL #4   Title  Increase FOTO score to </= 37% limited to demo improvement in function     Time  6    Period  Weeks    Status  New    Target Date  10/24/17      PT LONG TERM GOAL #5   Title  pt to be I with all HEP given to maintain and promote current level of function    Time  6    Period  Weeks    Status  New    Target Date  10/24/17            Plan - 09/30/17 1053    Clinical Impression Statement  pt reports 5/10 pain initally today. She demonstrates R elevated ASIS/ illic crest and greater trochanter suggesting an upslip on the R. continued mobs grade 4 for long axis distraction being mindfull of osteoporosis. she was able to perform all exercises today reported no pain. post session she declined modalities and reported no pain. updated HEP for sit to stand and sidelying hip abduction.     PT Treatment/Interventions  ADLs/Self Care Home Management;Cryotherapy;Balance training;Patient/family education;Dry needling;Taping;Manual techniques;Therapeutic exercise;Therapeutic activities    PT Next Visit Plan  Check tolerance to heel lift-PT reassess leg length, (hx of CX: no heat / e-stim unless specifically signed off by oncologist) Review HEP/ update PRN: R  posteriorly rotated innominate: hamstring stretch, hip flexor activation, core strengthening. posture education    PT Home Exercise Plan  hamstring stretching (seated and supine), hip flexor MET on R, lower trunk rotation. right Quadratus lumborum stretching , figure 4 and piriformis stretch, sit to stand, sidelying hip abduction.     Consulted  and Agree with Plan of Care  Patient       Patient will benefit from skilled therapeutic intervention in order to improve the following deficits and impairments:     Visit Diagnosis: Chronic right-sided low back pain without sciatica  Muscle spasm of back  Abnormal posture     Problem List Patient Active Problem List   Diagnosis Date Noted  . Age-related osteoporosis without current pathological fracture 02/18/2017  . 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 Essential hypertension 09/17/2014  . Hyperlipidemia 09/17/2014  . Thrombocytopenia ([place]) 08/23/2013  . Cardiomyopathy ([place]) 08/12/2013  . Diastolic dysfunction grade 2 08/12/2013   Starr Lake PT, DPT, LAT, ATC  09/30/17  11:01 AM      [facility] [address] [place], [place], [address] Phone: [phone]   Fax:  [phone]  Name: Tonya Wise MRN: [id number] Date of Birth: [birth year]

## 2017-10-02 ENCOUNTER — Encounter: Payer: Self-pay | Admitting: Physical Therapy

## 2017-10-02 ENCOUNTER — Ambulatory Visit: Payer: Medicare Other | Admitting: Physical Therapy

## 2017-10-02 DIAGNOSIS — R293 Abnormal posture: Secondary | ICD-10-CM | POA: Diagnosis not present

## 2017-10-02 DIAGNOSIS — M62830 Muscle spasm of back: Secondary | ICD-10-CM

## 2017-10-02 DIAGNOSIS — M545 Low back pain: Secondary | ICD-10-CM | POA: Diagnosis not present

## 2017-10-02 DIAGNOSIS — G8929 Other chronic pain: Secondary | ICD-10-CM

## 2017-10-02 NOTE — Therapy (Signed)
[place], [place], [address] Phone: [phone]   Fax:  [phone]  Physical Therapy Treatment  Patient Details  Name: Tonya Wise MRN: [id number] Date of Birth: [birth year] Referring Provider: Denita Lung, MD   Encounter Date: 10/02/2017  PT End of Session - 10/02/17 9150    Visit Number  5    Number of Visits  13    Date for PT Re-Evaluation  10/24/17    PT Start Time  0715    PT Stop Time  0800    PT Time Calculation (min)  45 min       Past Medical History:  Diagnosis Date  . Allergy   . Anemia   . Ankle fracture, left   . Chemotherapy induced cardiomyopathy ([place])    a. 08/2014: echo showing EF of 35-40% with Grade 2 DD and mild MR  . Diastolic dysfunction, grade I 04/12/2013  . GERD (gastroesophageal reflux disease)    Tums or Maalox  . Heart murmur   . Hypercalcemia 03/27/2013  . Hypertension   . Non-Hodgkins lymphoma ([place]) 04/06/2013  . Osteopenia   . Renal insufficiency     Past Surgical History:  Procedure Laterality Date  . ABDOMINAL HYSTERECTOMY    . APPENDECTOMY    . BACK SURGERY     LOWER BACK TWICE  . COLONOSCOPY  2008  . ESOPHAGOGASTRODUODENOSCOPY N/A 03/29/2013   Procedure: ESOPHAGOGASTRODUODENOSCOPY (EGD);  Surgeon: Juanita Craver, MD;  Location[facility] ENDOSCOPY;  Service: Endoscopy;  Laterality: N/A;  . INGUINAL LYMPH NODE BIOPSY Right 03/31/2013   Procedure: INGUINAL LYMPH NODE BIOPSY;  Surgeon: Gwenyth Ober, MD;  Location: [place];  Service: General;  Laterality: Right;  . ORIF ANKLE FRACTURE Left 11/03/2015   Procedure: OPEN REDUCTION INTERNAL FIXATION (ORIF) LEFT ANKLE FRACTURE;  Surgeon: Wylene Simmer, MD;  Location: [place];  Service: Orthopedics;  Laterality: Left;  . SPINE SURGERY      There were no vitals filed for this visit.                   [facility] Adult PT Treatment/Exercise - 10/02/17 0001      Lumbar Exercises: Stretches    Lower Trunk Rotation  4 reps;20 seconds    Piriformis Stretch  2 reps;20 seconds    Figure 4 Stretch  20 seconds    Other Lumbar Stretch Exercise  Side lying with pillow roll QL stretch x 2 minutes       Lumbar Exercises: Supine   Clam  20 reps green    Dead Bug  -- 4 x 15 sec hold      Lumbar Exercises: Sidelying   Hip Abduction  15 reps 2 sets bilateral      Knee/Hip Exercises: Aerobic   Nustep  L5 x 5 min UE/LE      Knee/Hip Exercises: Seated   Sit to Sand  without UE support 2 x 10 from table      Knee/Hip Exercises: Supine   Bridges  10 reps    Bridges with [employer]  10 reps    Bridges with [employer]  10 reps red band               PT Short Term Goals - 09/12/17 0944      PT SHORT TERM GOAL #1   Title  pt to be I with inital HEP given     Time  3  Period  Weeks    Status  New    Target Date  10/03/17        PT Long Term Goals - 09/12/17 0944      PT LONG TERM GOAL #1   Title  pt to verbalize and demo proper posture in various positions, and utilize approprate lifting techniques to prevent/ reduce low back pain    Time  6    Period  Weeks    Status  New    Target Date  10/24/17      PT LONG TERM GOAL #2   Title  pt will be able to peform trunk motions WFL reporting </= 2/10 pain     Time  6    Period  Weeks    Status  New    Target Date  10/24/17      PT LONG TERM GOAL #3   Title  pt to be able to return to gardening and playing golf reporting </= 2/10 pain in the low back for pt's personal goals    Time  6    Period  Weeks    Status  New    Target Date  10/24/17      PT LONG TERM GOAL #4   Title  Increase FOTO score to </= 37% limited to demo improvement in function     Time  6    Period  Weeks    Status  New    Target Date  10/24/17      PT LONG TERM GOAL #5   Title  pt to be I with all HEP given to maintain and promote current level of function    Time  6    Period  Weeks    Status  New    Target Date  10/24/17             Plan - 10/02/17 0804    Clinical Impression Statement  Pt reports pain 4-5/10 in mid low back and also right low back. We reviewed all HEP. She felt good after last session but pain returned. She recently joined a fitness club with her daughter.     PT Next Visit Plan  Review gym machines-review osteoporosis precautions with gym equipment. Check tolerance to heel lift-PT reassess leg length, (hx of CX: no heat / e-stim unless specifically signed off by oncologist) Review HEP/ update PRN: R posteriorly rotated innominate: hamstring stretch, hip flexor activation, core strengthening. posture education    PT Home Exercise Plan  hamstring stretching (seated and supine), hip flexor MET on R, lower trunk rotation. right Quadratus lumborum stretching , figure 4 and piriformis stretch, sit to stand, sidelying hip abduction.     Consulted and Agree with Plan of Care  Patient       Patient will benefit from skilled therapeutic intervention in order to improve the following deficits and impairments:  Abnormal gait, Pain, Postural dysfunction, Improper body mechanics, Decreased activity tolerance, Decreased endurance, Increased muscle spasms  Visit Diagnosis: Chronic right-sided low back pain without sciatica  Muscle spasm of back  Abnormal posture     Problem List Patient Active Problem List   Diagnosis Date Noted  . Age-related osteoporosis without current pathological fracture 02/18/2017  . 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 Essential hypertension 09/17/2014  . Hyperlipidemia 09/17/2014  . Thrombocytopenia ([place]) 08/23/2013  . Cardiomyopathy ([place]) 08/12/2013  . Diastolic dysfunction grade 2 08/12/2013    Dorene Ar,  PTA 10/02/2017, 8:08 AM  [place], [place], [address] Phone: [phone]   Fax:  [phone]  Name: OCEANNA ARRUDA MRN:  [id number] Date of Birth: [birth year]

## 2017-10-07 ENCOUNTER — Encounter: Payer: Self-pay | Admitting: Physical Therapy

## 2017-10-07 ENCOUNTER — Ambulatory Visit: Payer: Medicare Other | Admitting: Physical Therapy

## 2017-10-07 DIAGNOSIS — M545 Low back pain: Secondary | ICD-10-CM | POA: Diagnosis not present

## 2017-10-07 DIAGNOSIS — R293 Abnormal posture: Secondary | ICD-10-CM

## 2017-10-07 DIAGNOSIS — G8929 Other chronic pain: Secondary | ICD-10-CM | POA: Diagnosis not present

## 2017-10-07 DIAGNOSIS — M62830 Muscle spasm of back: Secondary | ICD-10-CM | POA: Diagnosis not present

## 2017-10-07 NOTE — Therapy (Signed)
[place], [place], [address] Phone: [phone]   Fax:  [phone]  Physical Therapy Treatment  Patient Details  Name: Tonya Wise MRN: [id number] Date of Birth: Jul 13, 1941 Referring Provider: Denita Lung, MD   Encounter Date: 10/07/2017  PT End of Session - 10/07/17 0719    Visit Number  6    Number of Visits  13    Date for PT Re-Evaluation  10/24/17    PT Start Time  0715    PT Stop Time  0800    PT Time Calculation (min)  45 min       Past Medical History:  Diagnosis Date  . Allergy   . Anemia   . Ankle fracture, left   . Chemotherapy induced cardiomyopathy ([place])    a. 08/2014: echo showing EF of 35-40% with Grade 2 DD and mild MR  . Diastolic dysfunction, grade I 04/12/2013  . GERD (gastroesophageal reflux disease)    Tums or Maalox  . Heart murmur   . Hypercalcemia 03/27/2013  . Hypertension   . Non-Hodgkins lymphoma ([place]) 04/06/2013  . Osteopenia   . Renal insufficiency     Past Surgical History:  Procedure Laterality Date  . ABDOMINAL HYSTERECTOMY    . APPENDECTOMY    . BACK SURGERY     LOWER BACK TWICE  . COLONOSCOPY  2008  . ESOPHAGOGASTRODUODENOSCOPY N/A 03/29/2013   Procedure: ESOPHAGOGASTRODUODENOSCOPY (EGD);  Surgeon: Juanita Craver, MD;  Location: [facility] ENDOSCOPY;  Service: Endoscopy;  Laterality: N/A;  . INGUINAL LYMPH NODE BIOPSY Right 03/31/2013   Procedure: INGUINAL LYMPH NODE BIOPSY;  Surgeon: Gwenyth Ober, MD;  Location: [place];  Service: General;  Laterality: Right;  . ORIF ANKLE FRACTURE Left 11/03/2015   Procedure: OPEN REDUCTION INTERNAL FIXATION (ORIF) LEFT ANKLE FRACTURE;  Surgeon: Wylene Simmer, MD;  Location: [place];  Service: Orthopedics;  Laterality: Left;  . SPINE SURGERY      There were no vitals filed for this visit.  Subjective Assessment - 10/07/17 0718    Subjective  Right sided pain is better. Pain is mid low back.     Currently in  Pain?  Yes    Pain Score  3     Pain Location  Back    Pain Orientation  Lower;Mid    Pain Descriptors / Indicators  Throbbing;Constant         [facility] PT Assessment - 10/07/17 0001      AROM   Lumbar Flexion  -- min pain end range    Lumbar Extension  -- min pain end range     Lumbar - Right Side Bend  -- min pain end range    Lumbar - Left Side Bend  -- min pain end range                   [facility] Adult PT Treatment/Exercise - 10/07/17 0001      Lumbar Exercises: Machines for Strengthening   Other Lumbar Machine Exercise  Seated Row 15# mid and low x 10 each     Other Lumbar Machine Exercise  Lat pull down 15 # 10 x 2       Lumbar Exercises: Sidelying   Hip Abduction  15 reps 2 sets bilateral      Knee/Hip Exercises: Stretches   Other Knee/Hip Stretches  seated childs pose with laterals using green physioball     Other Knee/Hip Stretches  open books x  5 each way       Knee/Hip Exercises: Aerobic   Nustep  L5 x 5 min UE/LE      Knee/Hip Exercises: Seated   Other Seated Knee/Hip Exercises  Seated on physioball pelvic rocking, forward lateral, circles, marching with neutral spine, added UE raises     Sit to Sand  without UE support 2 x 10 from table      Knee/Hip Exercises: Supine   Bridges  10 reps    Bridges with [employer]  10 reps    Other Supine Knee/Hip Exercises  bridge with feet on ball x 10                PT Short Term Goals - 10/07/17 0751      PT SHORT TERM GOAL #1   Title  pt to be I with inital HEP given     Time  3    Period  Weeks    Status  Achieved        PT Long Term Goals - 09/12/17 0944      PT LONG TERM GOAL #1   Title  pt to verbalize and demo proper posture in various positions, and utilize approprate lifting techniques to prevent/ reduce low back pain    Time  6    Period  Weeks    Status  New    Target Date  10/24/17      PT LONG TERM GOAL #2   Title  pt will be able to peform trunk motions WFL reporting </=  2/10 pain     Time  6    Period  Weeks    Status  New    Target Date  10/24/17      PT LONG TERM GOAL #3   Title  pt to be able to return to gardening and playing golf reporting </= 2/10 pain in the low back for pt's personal goals    Time  6    Period  Weeks    Status  New    Target Date  10/24/17      PT LONG TERM GOAL #4   Title  Increase FOTO score to </= 37% limited to demo improvement in function     Time  6    Period  Weeks    Status  New    Target Date  10/24/17      PT LONG TERM GOAL #5   Title  pt to be I with all HEP given to maintain and promote current level of function    Time  6    Period  Weeks    Status  New    Target Date  10/24/17            Plan - 10/07/17 0747    Clinical Impression Statement  Pt reports pain more cnetralized to mid low back with some tenderness present on right low back. SHe is going to the fitness club. Instructed her in seated row and lat pull downs. Discussed avoidance of abdominal machines and lumbar flexion postures. She reports increased pain with sitting and standing. Used physioball for pelvis/trunk mobility and core exercises as she has a ball at home. No increased pain with treatment.     PT Next Visit Plan  Review gym machines-review osteoporosis precautions with gym equipment. Check tolerance to heel lift-PT reassess leg length, (hx of CX: no heat / e-stim unless specifically signed off by oncologist) Review HEP/ update PRN:  R posteriorly rotated innominate: hamstring stretch, hip flexor activation, core strengthening. posture education    PT Home Exercise Plan  hamstring stretching (seated and supine), hip flexor MET on R, lower trunk rotation. right Quadratus lumborum stretching , figure 4 and piriformis stretch, sit to stand, sidelying hip abduction.     Consulted and Agree with Plan of Care  Patient       Patient will benefit from skilled therapeutic intervention in order to improve the following deficits and  impairments:  Abnormal gait, Pain, Postural dysfunction, Improper body mechanics, Decreased activity tolerance, Decreased endurance, Increased muscle spasms  Visit Diagnosis: Chronic right-sided low back pain without sciatica  Muscle spasm of back  Abnormal posture     Problem List Patient Active Problem List   Diagnosis Date Noted  . Age-related osteoporosis without current pathological fracture 02/18/2017  . 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 Essential hypertension 09/17/2014  . Hyperlipidemia 09/17/2014  . Thrombocytopenia ([place]) 08/23/2013  . Cardiomyopathy ([place]) 08/12/2013  . Diastolic dysfunction grade 2 08/12/2013    Dorene Ar, PTA 10/07/2017, 10:32 AM  [place], [place], [address] Phone: [phone]   Fax:  [phone]  Name: TIYA SCHRUPP MRN: [id number] Date of Birth: [birth year]

## 2017-10-09 ENCOUNTER — Ambulatory Visit: Payer: Medicare Other | Admitting: Physical Therapy

## 2017-10-11 DIAGNOSIS — H01009 Unspecified blepharitis unspecified eye, unspecified eyelid: Secondary | ICD-10-CM | POA: Diagnosis not present

## 2017-10-11 DIAGNOSIS — H04123 Dry eye syndrome of bilateral lacrimal glands: Secondary | ICD-10-CM | POA: Diagnosis not present

## 2017-10-11 DIAGNOSIS — H16223 Keratoconjunctivitis sicca, not specified as Sjogren's, bilateral: Secondary | ICD-10-CM | POA: Diagnosis not present

## 2017-10-14 ENCOUNTER — Ambulatory Visit: Payer: Medicare Other | Admitting: Physical Therapy

## 2017-10-14 DIAGNOSIS — G8929 Other chronic pain: Secondary | ICD-10-CM

## 2017-10-14 DIAGNOSIS — M62830 Muscle spasm of back: Secondary | ICD-10-CM

## 2017-10-14 DIAGNOSIS — R293 Abnormal posture: Secondary | ICD-10-CM

## 2017-10-14 DIAGNOSIS — M545 Low back pain, unspecified: Secondary | ICD-10-CM

## 2017-10-14 NOTE — Therapy (Addendum)
[place], [place], [address] Phone: [phone]   Fax:  [phone]  Physical Therapy Treatment / Discharge Summary  Patient Details  Name: Tonya Wise MRN: [id number] Date of Birth: November 08, 1940 Referring Provider: Denita Lung, MD   Encounter Date: 10/14/2017  PT End of Session - 10/14/17 0719    Visit Number  7    Number of Visits  13    Date for PT Re-Evaluation  10/24/17    PT Start Time  0715    PT Stop Time  2725    PT Time Calculation (min)  42 min       Past Medical History:  Diagnosis Date  . Allergy   . Anemia   . Ankle fracture, left   . Chemotherapy induced cardiomyopathy ([place])    a. 08/2014: echo showing EF of 35-40% with Grade 2 DD and mild MR  . Diastolic dysfunction, grade I 04/12/2013  . GERD (gastroesophageal reflux disease)    Tums or Maalox  . Heart murmur   . Hypercalcemia 03/27/2013  . Hypertension   . Non-Hodgkins lymphoma ([place]) 04/06/2013  . Osteopenia   . Renal insufficiency     Past Surgical History:  Procedure Laterality Date  . ABDOMINAL HYSTERECTOMY    . APPENDECTOMY    . BACK SURGERY     LOWER BACK TWICE  . COLONOSCOPY  2008  . ESOPHAGOGASTRODUODENOSCOPY N/A 03/29/2013   Procedure: ESOPHAGOGASTRODUODENOSCOPY (EGD);  Surgeon: Juanita Craver, MD;  Location: [facility] ENDOSCOPY;  Service: Endoscopy;  Laterality: N/A;  . INGUINAL LYMPH NODE BIOPSY Right 03/31/2013   Procedure: INGUINAL LYMPH NODE BIOPSY;  Surgeon: Gwenyth Ober, MD;  Location: [place];  Service: General;  Laterality: Right;  . ORIF ANKLE FRACTURE Left 11/03/2015   Procedure: OPEN REDUCTION INTERNAL FIXATION (ORIF) LEFT ANKLE FRACTURE;  Surgeon: Wylene Simmer, MD;  Location: [place];  Service: Orthopedics;  Laterality: Left;  . SPINE SURGERY      There were no vitals filed for this visit.  Subjective Assessment - 10/14/17 0718    Currently in Pain?  Yes    Pain Score  2     Pain Location   Back    Pain Orientation  Mid;Lower    Pain Descriptors / Indicators  Aching    Pain Frequency  Intermittent    Aggravating Factors   First wake in the morning    Pain Relieving Factors  keep moving.          [facility] PT Assessment - 10/14/17 0001      Observation/Other Assessments   Focus on Therapeutic Outcomes (FOTO)   28% limited improved from 42% limited       AROM   Lumbar Flexion  -- no pain    Lumbar Extension  -- no pain    Lumbar - Right Side Bend  -- no pain    Lumbar - Left Side Bend  -- minimal end range pain      Strength   Right/Left Hip  Right;Left    Right Hip ABduction  4/5    Left Hip ABduction  4/5                  [facility] Adult PT Treatment/Exercise - 10/14/17 0001      Lumbar Exercises: Stretches   Piriformis Stretch  2 reps;20 seconds    Figure 4 Stretch  20 seconds      Lumbar Exercises: Supine   Clam  20 reps green    Dead Bug  -- 4 x 15 sec hold      Lumbar Exercises: Sidelying   Hip Abduction  15 reps 2 sets bilateral      Knee/Hip Exercises: Stretches   Other Knee/Hip Stretches  seated childs pose with laterals using green physioball       Knee/Hip Exercises: Aerobic   Nustep  L5 x 5 min UE/LE      Knee/Hip Exercises: Seated   Other Seated Knee/Hip Exercises  Seated on physioball pelvic rocking, forward lateral, circles, marching with neutral spine, added UE raises     Sit to Sand  without UE support 2 x 10 from table      Knee/Hip Exercises: Supine   Bridges  10 reps    Bridges with [employer]  10 reps    Wainwright with Clamshell  10 reps red band    Other Supine Knee/Hip Exercises  bridge with feet on ball x 10                PT Short Term Goals - 10/07/17 0751      PT SHORT TERM GOAL #1   Title  pt to be I with inital HEP given     Time  3    Period  Weeks    Status  Achieved        PT Long Term Goals - 10/14/17 0745      PT LONG TERM GOAL #1   Title  pt to verbalize and demo proper posture in various  positions, and utilize approprate lifting techniques to prevent/ reduce low back pain    Time  6    Period  Weeks    Status  Achieved      PT LONG TERM GOAL #2   Title  pt will be able to peform trunk motions WFL reporting </= 2/10 pain     Time  6    Period  Weeks    Status  Achieved      PT LONG TERM GOAL #3   Title  pt to be able to return to gardening and playing golf reporting </= 2/10 pain in the low back for pt's personal goals    Time  6    Period  Weeks    Status  Deferred      PT LONG TERM GOAL #4   Title  Increase FOTO score to </= 37% limited to demo improvement in function     Baseline  28% limited    Time  6    Period  Weeks      PT LONG TERM GOAL #5   Title  pt to be I with all HEP given to maintain and promote current level of function    Period  Weeks    Status  Achieved            Plan - 10/14/17 0744    Clinical Impression Statement  Pt reports 75% decrease in pain since beginning PT. Her pain in the morning is decreasing. She has minimal pain with left sidebend otherwise no pain with other motions. All goals met except gardening and golfing goals. She plans to return to gardening in the spring however may not attempt golf at this time. FOTO score imporoved to 28% limited.     PT Next Visit Plan  discharge today    PT Home Exercise Plan  hamstring stretching (seated and supine), hip flexor MET on R,  lower trunk rotation. right Quadratus lumborum stretching , figure 4 and piriformis stretch, sit to stand, sidelying hip abduction.     Consulted and Agree with Plan of Care  Patient       Patient will benefit from skilled therapeutic intervention in order to improve the following deficits and impairments:  Abnormal gait, Pain, Postural dysfunction, Improper body mechanics, Decreased activity tolerance, Decreased endurance, Increased muscle spasms  Visit Diagnosis: Chronic right-sided low back pain without sciatica  Muscle spasm of back  Abnormal  posture     Problem List Patient Active Problem List   Diagnosis Date Noted  . Age-related osteoporosis without current pathological fracture 02/18/2017  . 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 Essential hypertension 09/17/2014  . Hyperlipidemia 09/17/2014  . Thrombocytopenia ([place]) 08/23/2013  . Cardiomyopathy ([place]) 08/12/2013  . Diastolic dysfunction grade 2 08/12/2013    Dorene Ar, PTA 10/14/2017, 8:02 AM  [facility] [address] [place], [place], [address] Phone: [phone]   Fax:  [phone]  Name: Tonya Wise MRN: [id number] Date of Birth: June 28, 1941       PHYSICAL THERAPY DISCHARGE SUMMARY  Visits from Start of Care: 7  Current functional level related to goals / functional outcomes: See goals   Remaining deficits: See above assessment   Education / Equipment: HEP, posture,   Plan: Patient agrees to discharge.  Patient goals were met. Patient is being discharged due to meeting the stated rehab goals.  ?????     Kristoffer Leamon PT, DPT, LAT, ATC  11/11/17  4:44 PM

## 2017-10-16 ENCOUNTER — Encounter: Payer: Medicare Other | Admitting: Physical Therapy

## 2017-11-14 DIAGNOSIS — H35372 Puckering of macula, left eye: Secondary | ICD-10-CM | POA: Diagnosis not present

## 2017-11-14 DIAGNOSIS — H2512 Age-related nuclear cataract, left eye: Secondary | ICD-10-CM | POA: Diagnosis not present

## 2017-11-14 DIAGNOSIS — H35033 Hypertensive retinopathy, bilateral: Secondary | ICD-10-CM | POA: Diagnosis not present

## 2017-11-14 DIAGNOSIS — H25012 Cortical age-related cataract, left eye: Secondary | ICD-10-CM | POA: Diagnosis not present

## 2017-11-26 DIAGNOSIS — H2512 Age-related nuclear cataract, left eye: Secondary | ICD-10-CM | POA: Diagnosis not present

## 2017-11-26 DIAGNOSIS — H25812 Combined forms of age-related cataract, left eye: Secondary | ICD-10-CM | POA: Diagnosis not present

## 2017-12-06 ENCOUNTER — Inpatient Hospital Stay: Payer: Medicare Other | Attending: Internal Medicine

## 2017-12-06 ENCOUNTER — Ambulatory Visit (HOSPITAL_COMMUNITY)
Admission: RE | Admit: 2017-12-06 | Discharge: 2017-12-06 | Disposition: A | Discharge status: Home / Self Care | Payer: Medicare Other | Source: Ambulatory Visit | Attending: Internal Medicine | Admitting: Internal Medicine

## 2017-12-06 ENCOUNTER — Encounter (HOSPITAL_COMMUNITY): Payer: Self-pay

## 2017-12-06 DIAGNOSIS — Z79899 Other long term (current) drug therapy: Secondary | ICD-10-CM | POA: Diagnosis not present

## 2017-12-06 DIAGNOSIS — K219 Gastro-esophageal reflux disease without esophagitis: Secondary | ICD-10-CM | POA: Diagnosis not present

## 2017-12-06 DIAGNOSIS — Z7982 Long term (current) use of aspirin: Secondary | ICD-10-CM | POA: Diagnosis not present

## 2017-12-06 DIAGNOSIS — I427 Cardiomyopathy due to drug and external agent: Secondary | ICD-10-CM | POA: Insufficient documentation

## 2017-12-06 DIAGNOSIS — I10 Essential (primary) hypertension: Secondary | ICD-10-CM | POA: Diagnosis not present

## 2017-12-06 DIAGNOSIS — Z8572 Personal history of non-Hodgkin lymphomas: Secondary | ICD-10-CM | POA: Insufficient documentation

## 2017-12-06 DIAGNOSIS — Z9221 Personal history of antineoplastic chemotherapy: Secondary | ICD-10-CM | POA: Insufficient documentation

## 2017-12-06 DIAGNOSIS — C8333 Diffuse large B-cell lymphoma, intra-abdominal lymph nodes: Secondary | ICD-10-CM | POA: Diagnosis not present

## 2017-12-06 DIAGNOSIS — C8590 Non-Hodgkin lymphoma, unspecified, unspecified site: Secondary | ICD-10-CM | POA: Diagnosis not present

## 2017-12-06 LAB — COMPREHENSIVE METABOLIC PANEL
ALBUMIN: 4 g/dL (ref 3.5–5.0)
ALT: 13 U/L (ref 0–55)
ANION GAP: 9 (ref 3–11)
AST: 16 U/L (ref 5–34)
Alkaline Phosphatase: 50 U/L (ref 40–150)
BILIRUBIN TOTAL: 0.5 mg/dL (ref 0.2–1.2)
BUN: 24 mg/dL (ref 7–26)
CO2: 28 mmol/L (ref 22–29)
Calcium: 10.1 mg/dL (ref 8.4–10.4)
Chloride: 105 mmol/L (ref 98–109)
Creatinine, Ser: 1.46 mg/dL — ABNORMAL HIGH (ref 0.60–1.10)
GFR calc Af Amer: 39 mL/min — ABNORMAL LOW (ref >60)
GFR calc non Af Amer: 34 mL/min — ABNORMAL LOW (ref >60)
GLUCOSE: 88 mg/dL (ref 70–140)
POTASSIUM: 4.2 mmol/L (ref 3.5–5.1)
SODIUM: 142 mmol/L (ref 136–145)
TOTAL PROTEIN: 6.3 g/dL — AB (ref 6.4–8.3)

## 2017-12-06 LAB — LACTATE DEHYDROGENASE: LDH: 218 U/L (ref 125–245)

## 2017-12-06 LAB — CBC WITH DIFFERENTIAL/PLATELET
BASOS ABS: 0 10*3/uL (ref 0.0–0.1)
Basophils Relative: 1 %
Eosinophils Absolute: 0.1 10*3/uL (ref 0.0–0.5)
Eosinophils Relative: 2 %
HEMATOCRIT: 43.8 % (ref 34.8–46.6)
HEMOGLOBIN: 14.4 g/dL (ref 11.6–15.9)
LYMPHS PCT: 30 %
Lymphs Abs: 1.6 10*3/uL (ref 0.9–3.3)
MCH: 28.1 pg (ref 25.1–34.0)
MCHC: 32.9 g/dL (ref 31.5–36.0)
MCV: 85.5 fL (ref 79.5–101.0)
MONO ABS: 0.4 10*3/uL (ref 0.1–0.9)
Monocytes Relative: 8 %
NEUTROS ABS: 3.1 10*3/uL (ref 1.5–6.5)
Neutrophils Relative %: 59 %
Platelets: 118 10*3/uL — ABNORMAL LOW (ref 145–400)
RBC: 5.12 MIL/uL (ref 3.70–5.45)
RDW: 14.4 % (ref 11.2–14.5)
WBC: 5.3 10*3/uL (ref 3.9–10.3)

## 2017-12-06 MED ORDER — IOHEXOL 300 MG/ML  SOLN
100.0000 mL | Freq: Once | INTRAMUSCULAR | Status: AC | PRN
  Administered 2017-12-06: 75 mL via INTRAVENOUS

## 2017-12-09 ENCOUNTER — Inpatient Hospital Stay (HOSPITAL_BASED_OUTPATIENT_CLINIC_OR_DEPARTMENT_OTHER): Payer: Medicare Other | Admitting: Internal Medicine

## 2017-12-09 ENCOUNTER — Encounter: Payer: Self-pay | Admitting: Internal Medicine

## 2017-12-09 ENCOUNTER — Telehealth: Payer: Self-pay | Admitting: Internal Medicine

## 2017-12-09 VITALS — BP 167/75 | HR 58 | Temp 97.8°F | Resp 16 | Ht 63.0 in | Wt 148.5 lb

## 2017-12-09 DIAGNOSIS — Z9221 Personal history of antineoplastic chemotherapy: Secondary | ICD-10-CM | POA: Diagnosis not present

## 2017-12-09 DIAGNOSIS — I10 Essential (primary) hypertension: Secondary | ICD-10-CM | POA: Diagnosis not present

## 2017-12-09 DIAGNOSIS — C8333 Diffuse large B-cell lymphoma, intra-abdominal lymph nodes: Secondary | ICD-10-CM

## 2017-12-09 DIAGNOSIS — Z8572 Personal history of non-Hodgkin lymphomas: Secondary | ICD-10-CM | POA: Diagnosis not present

## 2017-12-09 DIAGNOSIS — K219 Gastro-esophageal reflux disease without esophagitis: Secondary | ICD-10-CM | POA: Diagnosis not present

## 2017-12-09 DIAGNOSIS — I427 Cardiomyopathy due to drug and external agent: Secondary | ICD-10-CM | POA: Diagnosis not present

## 2017-12-09 DIAGNOSIS — Z7982 Long term (current) use of aspirin: Secondary | ICD-10-CM | POA: Diagnosis not present

## 2017-12-09 NOTE — Progress Notes (Signed)
[place] [phone]   Fax:([phone]) [place] [address]  DIAGNOSIS: Diffuse large B-cell non-Hodgkin lymphoma diagnosed in September 2014.  PRIOR THERAPY:   Non-Hodgkins lymphoma ([place]) (Resolved)   03/27/2013 - 03/31/2013 Hospital Admission    Admitted to [facility] for profound anemia (Hgb 6.4).  C/o fevers, drenching nightsweats, weight lost.  Negative colonoscopy and EGD.  CT of C/A/P demonstrated significant lymphadenopathy suspcious of ympha.        03/31/2013 Initial Diagnosis    Biopsy of R inguinal lymph node revealed Non-Hodgkins lymphoma. Diffuse Large B-cell Lymphoma      04/10/2013 - 04/24/2013 Hospital Admission    Hypercalcemia, renal failure and dehydration.  Started min-RCHOP.       04/15/2013 Bone Marrow Biopsy    Involvement with Diffuse Large B-cell Lymphoma. Stage IV NHL w B-symptoms.       04/18/2013 - 04/18/2013 Chemotherapy    Mini R-CHOP Cycle #1 on 04/18/2013. Dose reduced to poor nutrion. It consisted on Cytoxan 400 mg/m2,Doxorubicin 25 mg/m2, Rituximab 375 mg/m2, Vincristine 67m.  Prednisone 100 mg daily for 9/13 - 9/16.   Received neupogen on 9/21, 9/27 -9/30.        04/25/2013 - 04/29/2013 Hospital Admission    Admitted due to worsening mouth sores/oral thrush, odynophagia, failure to thrive.  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  [facility]Dr[facility] CGwynneth Munson and discharged to home on 05/19/2013.        06/01/2013 - 06/01/2013 Chemotherapy    Mini R-CHO #2 dosed as above.  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  Received neulasta shot on 06/02/13      06/16/2013 Cancer Staging    Re-staging PET/CT demonstrated significant interval reduction in axillary lymphadenopathy.  There was 1 small residual right axillary lymph node measuring 16 x 12 mm which showed  FDG uptake.  Signifcant reduction in mediastinal and hilar lymphadenopathy.       06/16/2013 Imaging    CT of chest noted left-sided pulmonary embolim. Started on lovenox to coumadin bridge.        06/22/2013 - 06/22/2013 Chemotherapy    Mini-R-CHO #3.  Received Neulasta on 06/23/2013.        07/16/2013 Imaging    2-D Echocardiogram demonstrates 35-45% EF with Grade 2 diastolic dysfunction.  Doxorubicin discontinued with consultation by cardiology (Dr. KClaiborne Billings.        08/19/2013 - 08/21/2013 Chemotherapy    Mini-R-CEO #4.  Etoposide 25 mg/m2 instead of doxo due to above.  Etoposide 529mbid on day 2/3.  On day 3 (08/21/2013) she received 1 of two oral doses of ectoposide.       08/21/2013 - 08/26/2013 Hospital Admission    [facility] and had influenzae A and pneumonia. Completed tamiflu and oral antibiotics.       09/25/2013 - 09/27/2013 Chemotherapy    Mini-R-CEO #5. Etoposide 25 mg/m2 25 mg bid on days 2/3.       10/16/2013 - 10/19/2013 Chemotherapy    Mini-R-CEO # 6. Etoposide 25 mg/m2 bid on days 2/3. Neulasta on day#4.       11/05/2013 Imaging    Interval resolution of the hypermetabolism identified within the enlarged right axillary lymph node seen previously. This lymph node has also decreased markedly in size in the interval. No new hypermetabolic lesions in  the neck, chest, abdomen, or  pelvis.       11/19/2013 Bone Marrow Biopsy    There is no evidence of a B-cell lymphoproliferative process in this material. Normal cytogenetics.       04/16/2014 Imaging    CT scan Chest, abdomen: No evidence of recurrent lymphoma within the chest or abdomen.   Significant decrease in splenomegaly since prior exam.        CURRENT THERAPY: Observation.  INTERVAL HISTORY: Tonya Wise 77 y.o. female returns to the clinic today for follow-up visit accompanied by her husband.  The patient is feeling fine today with no specific complaints except  for swelling in the right nostril.  She was seen by her primary care physician and showed about this lesion.  She has not seen ear nose and throat physician yet.  The patient denied having any recent weight loss or night sweats.  She has no nausea, vomiting, diarrhea or constipation.  She denied having any chest pain, shortness of breath, cough or hemoptysis.  She had a repeat CT scan of the chest, abdomen and pelvis performed recently and she is here for evaluation and discussion of her risk her results.    MEDICAL HISTORY: Past Medical History:  Diagnosis Date  . Allergy   . Anemia   . Ankle fracture, left   . Chemotherapy induced cardiomyopathy ([place])    a. 08/2014: echo showing EF of 35-40% with Grade 2 DD and mild MR  . Diastolic dysfunction, grade I 04/12/2013  . GERD (gastroesophageal reflux disease)    Tums or Maalox  . Heart murmur   . Hypercalcemia 03/27/2013  . Hypertension   . Non-Hodgkins lymphoma ([place]) 04/06/2013  . Osteopenia   . Renal insufficiency     ALLERGIES:  is allergic to prednisone and penicillins.  MEDICATIONS:  Current Outpatient Medications  Medication Sig Dispense Refill  . alendronate (FOSAMAX) 70 MG tablet Take 70 mg by mouth once a week.    Marland Kitchen aspirin EC 81 MG tablet Take 1 tablet (81 mg total) by mouth 2 (two) times daily. (Patient taking differently: Take 81 mg by mouth daily. ) 90 tablet 0  . cholecalciferol (VITAMIN D) 1000 units tablet Take 1,000 Units by mouth daily.    . cyanocobalamin 500 MCG tablet Take 500 mcg by mouth daily.     Marland Kitchen HYDROcodone-homatropine (HYCODAN) 5-1.5 MG/5ML syrup Take 5 mLs by mouth every 8 (eight) hours as needed for cough. (Patient not taking: Reported on 09/12/2017) 120 mL 0  . ibuprofen (ADVIL,MOTRIN) 200 MG tablet Take 400 mg by mouth every 6 (six) hours as needed.    Marland Kitchen losartan-hydrochlorothiazide (HYZAAR) 100-12.5 MG tablet Take 1 tablet by mouth daily. 90 tablet 3  . metoprolol tartrate (LOPRESSOR) 50 MG tablet Take 1  tablet (50 mg total) by mouth 2 (two) times daily. 180 tablet 3  . Misc Natural Products (OSTEO BI-FLEX TRIPLE STRENGTH) TABS Take 1 tablet by mouth 2 (two) times daily.    . ranitidine (ZANTAC) 75 MG tablet Take 75 mg by mouth as needed for heartburn.     No current facility-administered medications for this visit.     SURGICAL HISTORY:  Past Surgical History:  Procedure Laterality Date  . ABDOMINAL HYSTERECTOMY    . APPENDECTOMY    . BACK SURGERY     LOWER BACK TWICE  . COLONOSCOPY  2008  . ESOPHAGOGASTRODUODENOSCOPY N/A 03/29/2013   Procedure: ESOPHAGOGASTRODUODENOSCOPY (EGD);  Surgeon: Juanita Craver, MD;  Location: [place];  Service: Endoscopy;  Laterality: N/A;  . INGUINAL LYMPH NODE BIOPSY Right 03/31/2013   Procedure: INGUINAL LYMPH NODE BIOPSY;  Surgeon: Gwenyth Ober, MD;  Location: [place];  Service: General;  Laterality: Right;  . ORIF ANKLE FRACTURE Left 11/03/2015   Procedure: OPEN REDUCTION INTERNAL FIXATION (ORIF) LEFT ANKLE FRACTURE;  Surgeon: Wylene Simmer, MD;  Location: [place];  Service: Orthopedics;  Laterality: Left;  . SPINE SURGERY      REVIEW OF SYSTEMS:  A comprehensive review of systems was negative.   PHYSICAL EXAMINATION: General appearance: alert, cooperative and no distress Head: Normocephalic, without obvious abnormality, atraumatic Neck: no adenopathy, no JVD, supple, symmetrical, trachea midline and thyroid not enlarged, symmetric, no tenderness/mass/nodules Lymph nodes: Cervical, supraclavicular, and axillary nodes normal. Resp: clear to auscultation bilaterally Back: symmetric, no curvature. ROM normal. No CVA tenderness. Cardio: regular rate and rhythm, S1, S2 normal, no murmur, click, rub or gallop GI: soft, non-tender; bowel sounds normal; no masses,  no organomegaly Extremities: extremities normal, atraumatic, no cyanosis or edema  ECOG PERFORMANCE STATUS: 1 - Symptomatic but completely ambulatory  Blood pressure (!) 167/75,  pulse (!) 58, temperature 97.8 F (36.6 C), temperature source Oral, resp. rate 16, height 5' 3" (1.6 m), weight 148 lb 8 oz (67.4 kg), SpO2 100 %.  LABORATORY DATA: Lab Results  Component Value Date   WBC 5.3 12/06/2017   HGB 14.4 12/06/2017   HCT 43.8 12/06/2017   MCV 85.5 12/06/2017   PLT 118 (L) 12/06/2017      Chemistry      Component Value Date/Time   NA 142 12/06/2017 0805   NA 141 12/12/2016 0813   K 4.2 12/06/2017 0805   K 4.0 12/12/2016 0813   CL 105 12/06/2017 0805   CO2 28 12/06/2017 0805   CO2 26 12/12/2016 0813   BUN 24 12/06/2017 0805   BUN 20.3 12/12/2016 0813   CREATININE 1.46 (H) 12/06/2017 0805   CREATININE 1.46 (H) 08/05/2017 1639   CREATININE 1.3 (H) 12/12/2016 0813      Component Value Date/Time   CALCIUM 10.1 12/06/2017 0805   CALCIUM 8.9 12/12/2016 0813   ALKPHOS 50 12/06/2017 0805   ALKPHOS 55 12/12/2016 0813   AST 16 12/06/2017 0805   AST 13 12/12/2016 0813   ALT 13 12/06/2017 0805   ALT 10 12/12/2016 0813   BILITOT 0.5 12/06/2017 0805   BILITOT 0.55 12/12/2016 0813       RADIOGRAPHIC STUDIES: Ct Chest W Contrast  Result Date: 12/07/2017 CLINICAL DATA:  Non-Hodgkin's lymphoma, chemotherapy complete in 2015 EXAM: CT CHEST, ABDOMEN, AND PELVIS WITH CONTRAST TECHNIQUE: Multidetector CT imaging of the chest, abdomen and pelvis was performed following the standard protocol during bolus administration of intravenous contrast. CONTRAST:  83m OMNIPAQUE IOHEXOL 300 MG/ML  SOLN COMPARISON:  CT abdomen/pelvis dated 08/23/2017. CTA chest dated 11/23/2015. FINDINGS: CT CHEST FINDINGS Cardiovascular: The heart is normal in size. No pericardial effusion. No evidence of thoracic aortic aneurysm. Atherosclerotic calcifications of the aortic arch. Mediastinum/Nodes: No suspicious mediastinal, hilar, or axillary lymphadenopathy. Visualized thyroid is unremarkable. Lungs/Pleura: Lungs are essentially clear. No suspicious pulmonary nodules. Mild linear  scarring/atelectasis in the medial right middle lobe. No focal consolidation. No pleural effusion or pneumothorax. Musculoskeletal: Bilateral breast augmentation. Visualized osseous structures are within normal limits. CT ABDOMEN PELVIS FINDINGS Hepatobiliary: Liver is within normal limits. Gallbladder is unremarkable. No intrahepatic or extrahepatic ductal dilatation. Pancreas: Within normal limits. Spleen: Within normal limits for size. Adrenals/Urinary Tract: Adrenal glands within  normal limits. Kidneys are within normal limits.  No hydronephrosis. Bladder is within normal limits. Stomach/Bowel: Stomach is within normal limits. No evidence of bowel obstruction. Appendix is not discretely visualized. Vascular/Lymphatic: No evidence of abdominal aortic aneurysm. Atherosclerotic calcifications of the abdominal aorta and branch vessels. No suspicious abdominopelvic lymphadenopathy. Reproductive: Uterus is within normal limits. Bilateral ovaries are within normal limits. Other: No abdominopelvic ascites. Surgical clips in the right inguinal region. Musculoskeletal: Degenerative changes of the visualized thoracolumbar spine. Status post PLIF at L3-5. IMPRESSION: No suspicious lymphadenopathy in this patient with history of non-Hodgkin's lymphoma. Additional ancillary findings as above. Electronically Signed   By: Julian Hy M.D.   On: 12/07/2017 08:03   Ct Abdomen Pelvis W Contrast  Result Date: 12/07/2017 CLINICAL DATA:  Non-Hodgkin's lymphoma, chemotherapy complete in 2015 EXAM: CT CHEST, ABDOMEN, AND PELVIS WITH CONTRAST TECHNIQUE: Multidetector CT imaging of the chest, abdomen and pelvis was performed following the standard protocol during bolus administration of intravenous contrast. CONTRAST:  37m OMNIPAQUE IOHEXOL 300 MG/ML  SOLN COMPARISON:  CT abdomen/pelvis dated 08/23/2017. CTA chest dated 11/23/2015. FINDINGS: CT CHEST FINDINGS Cardiovascular: The heart is normal in size. No pericardial  effusion. No evidence of thoracic aortic aneurysm. Atherosclerotic calcifications of the aortic arch. Mediastinum/Nodes: No suspicious mediastinal, hilar, or axillary lymphadenopathy. Visualized thyroid is unremarkable. Lungs/Pleura: Lungs are essentially clear. No suspicious pulmonary nodules. Mild linear scarring/atelectasis in the medial right middle lobe. No focal consolidation. No pleural effusion or pneumothorax. Musculoskeletal: Bilateral breast augmentation. Visualized osseous structures are within normal limits. CT ABDOMEN PELVIS FINDINGS Hepatobiliary: Liver is within normal limits. Gallbladder is unremarkable. No intrahepatic or extrahepatic ductal dilatation. Pancreas: Within normal limits. Spleen: Within normal limits for size. Adrenals/Urinary Tract: Adrenal glands within normal limits. Kidneys are within normal limits.  No hydronephrosis. Bladder is within normal limits. Stomach/Bowel: Stomach is within normal limits. No evidence of bowel obstruction. Appendix is not discretely visualized. Vascular/Lymphatic: No evidence of abdominal aortic aneurysm. Atherosclerotic calcifications of the abdominal aorta and branch vessels. No suspicious abdominopelvic lymphadenopathy. Reproductive: Uterus is within normal limits. Bilateral ovaries are within normal limits. Other: No abdominopelvic ascites. Surgical clips in the right inguinal region. Musculoskeletal: Degenerative changes of the visualized thoracolumbar spine. Status post PLIF at L3-5. IMPRESSION: No suspicious lymphadenopathy in this patient with history of non-Hodgkin's lymphoma. Additional ancillary findings as above. Electronically Signed   By: SJulian HyM.D.   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 She has been in observation for the last few years.  Her recent CT scan of the chest, abdomen and pelvis showed no concerning findings for disease recurrence.  I discussed the scan results with the patient and her husband and recommended for her to continue in observation with repeat CT scan of the chest, abdomen and pelvis in 1 year. For the nasal lesion, I recommended for the patient to see ENT for further evaluation. For hypertension she was advised to take her blood pressure medication as prescribed and to discuss with her primary care physician for adjustment if needed. The patient was advised to call immediately if she has any concerning symptoms in the interval. The patient voices understanding of current disease status and treatment options and is in agreement with the current care plan. All questions were answered.  The patient knows to call the clinic with any problems, questions or concerns. We can certainly see the patient much sooner if necessary. I spent 10 minutes counseling the patient face to face. The total time spent in the appointment was 15 minutes.  Disclaimer: This note was dictated with voice recognition software. Similar sounding words can inadvertently be transcribed and may not be corrected upon review.

## 2017-12-09 NOTE — Telephone Encounter (Signed)
Scheduled appt per 5/13 los - sent reminder letter in the mail  

## 2017-12-16 ENCOUNTER — Other Ambulatory Visit: Payer: Self-pay

## 2017-12-16 ENCOUNTER — Telehealth: Payer: Self-pay

## 2017-12-16 DIAGNOSIS — I10 Essential (primary) hypertension: Secondary | ICD-10-CM

## 2017-12-16 MED ORDER — METOPROLOL TARTRATE 50 MG PO TABS: 50.0000 mg | ORAL_TABLET | Freq: Two times a day (BID) | ORAL | 3 refills | Status: DC

## 2017-12-16 MED ORDER — LOSARTAN POTASSIUM-HCTZ 100-12.5 MG PO TABS: 1.0000 | ORAL_TABLET | Freq: Every day | ORAL | 3 refills | Status: DC

## 2017-12-16 NOTE — Telephone Encounter (Signed)
Received fax request from [facility] for Metoprolol Tart 50 mg tabs take 1 tab by mouth twice a day, and losartan/HCT 100.12.5 mg daily for 90 day supply.

## 2017-12-16 NOTE — Telephone Encounter (Signed)
Pt was called to let her know that her med was sent in . [facility]

## 2018-02-20 ENCOUNTER — Other Ambulatory Visit: Payer: Self-pay | Admitting: Family Medicine

## 2018-03-17 ENCOUNTER — Ambulatory Visit (INDEPENDENT_AMBULATORY_CARE_PROVIDER_SITE_OTHER): Payer: Medicare Other | Admitting: Family Medicine

## 2018-03-17 ENCOUNTER — Encounter: Payer: Self-pay | Admitting: Family Medicine

## 2018-03-17 VITALS — BP 152/88 | HR 57 | Temp 98.4°F | Ht 62.0 in | Wt 146.4 lb

## 2018-03-17 DIAGNOSIS — I10 Essential (primary) hypertension: Secondary | ICD-10-CM | POA: Diagnosis not present

## 2018-03-17 MED ORDER — METOPROLOL TARTRATE 100 MG PO TABS: 100.0000 mg | ORAL_TABLET | Freq: Two times a day (BID) | ORAL | 3 refills | Status: DC

## 2018-03-17 NOTE — Progress Notes (Signed)
   Subjective:    Patient ID: Tonya Wise, female    DOB: September 17, 1940, 77 y.o.   MRN: [id number]  HPI He is here for evaluation of elevated blood pressure.  She has been noted to have elevated blood pressure over the last several years.  She continues on her present medications.  Her diet and exercise pattern has not changed.  She does not smoke.   Review of Systems     Objective:   Physical Exam Alert and in no distress.  Blood pressure is recorded.       Assessment & Plan:  Essential hypertension - Plan: metoprolol tartrate (LOPRESSOR) 100 MG tablet I will continue her on her losartan/HCTZ and double the Lopressor.  She will return here in 1 month for recheck.

## 2018-03-20 ENCOUNTER — Telehealth: Payer: Self-pay | Admitting: Cardiovascular Disease

## 2018-03-20 NOTE — Telephone Encounter (Signed)
Returned call to patient she stated her B/P has been elevated ranging 169/95 to 155/93. Pulse 55 to 60.Stated 3 days ago PCP doubled her metoprolol 100 mg 2 tablets twice a day.Spoke to our pharmacist Raquel she advised to take metoprolol 100 mg 1 tablet twice a day.Advised to call PCP and make him aware.Advised of low salt diet.Keep diary of B/P.Keep appointment with Fabian Sharp PA 04/04/18 at 9:00 am bring all medications and B/P diary to appointment.

## 2018-03-20 NOTE — Telephone Encounter (Signed)
New message    Pt c/o BP issue: STAT if pt c/o blurred vision, one-sided weakness or slurred speech  1. What are your last 5 BP readings? 156/95, 169/97  2. Are you having any other symptoms (ex. Dizziness, headache, blurred vision, passed out)? no  3. What is your BP issue? Feels BP too high

## 2018-03-21 ENCOUNTER — Ambulatory Visit: Payer: Medicare Other | Admitting: Family Medicine

## 2018-04-03 NOTE — Progress Notes (Signed)
Cardiology Office Note:    Date:  04/04/2018   ID:  Tonya Wise, DOB 1940/10/18, MRN [id number]  PCP:  Denita Lung, MD  Cardiologist:  Shelva Majestic, MD   Referring MD: Denita Lung, MD   Chief Complaint  Patient presents with  . Hypertension    History of Present Illness:    Tonya Wise is a 77 y.o. 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  She has been in remission since May 2018.  She was last seen in the clinic on 01/15/2017.  She was doing well at that time.  Her EKG showed improvement in her LVH however patient did not want to repeat an echocardiogram at that time.  Per phone notes in epic, she has been battling hypertension.  She presents today for evaluation of her blood pressure.  She saw her PCP on 03/17/2018 and her Lopressor was increased to 100 mg tablet.  She was continued on her losartan HCTZ without change in dose.    Pressure on intake was 132/80.  She states that her blood pressure before medications runs 121/76-144/84.  We discussed that she should take her blood pressure 2 hours after her morning pills instead of before medication.  It sounds like she is doing well on increased dose of metoprolol and Hyzaar.  She inquires about a PPI in the setting of chronic ibuprofen use and other ways to reduce her chronic back pain. Overall, she is doing very well.  Past Medical History:  Diagnosis Date  . Allergy   . Anemia   . Ankle fracture, left   . Chemotherapy induced cardiomyopathy ([place])    a. 08/2014: echo showing EF of 35-40% with Grade 2 DD and mild MR  . Diastolic dysfunction, grade I 04/12/2013  . GERD (gastroesophageal reflux disease)    Tums or Maalox  . Heart murmur   . Hypercalcemia 03/27/2013  . Hypertension   . Non-Hodgkins lymphoma ([place]) 04/06/2013  . Osteopenia   . Renal insufficiency     Past Surgical History:   Procedure Laterality Date  . ABDOMINAL HYSTERECTOMY    . APPENDECTOMY    . BACK SURGERY     LOWER BACK TWICE  . COLONOSCOPY  2008  . ESOPHAGOGASTRODUODENOSCOPY N/A 03/29/2013   Procedure: ESOPHAGOGASTRODUODENOSCOPY (EGD);  Surgeon: Juanita Craver, MD;  Location[facility] ENDOSCOPY;  Service: Endoscopy;  Laterality: N/A;  . INGUINAL LYMPH NODE BIOPSY Right 03/31/2013   Procedure: INGUINAL LYMPH NODE BIOPSY;  Surgeon: Gwenyth Ober, MD;  Location: [place];  Service: General;  Laterality: Right;  . ORIF ANKLE FRACTURE Left 11/03/2015   Procedure: OPEN REDUCTION INTERNAL FIXATION (ORIF) LEFT ANKLE FRACTURE;  Surgeon: Wylene Simmer, MD;  Location: [facility];  Service: Orthopedics;  Laterality: Left;  . SPINE SURGERY      Current Medications: Current Meds  Medication Sig  . alendronate (FOSAMAX) 70 MG tablet TAKE 1 TABLET (70 MG TOTAL) BY MOUTH EVERY 7 DAYS. TAKE WITH A FULL GLASS OF WATER ON AN EMPTY STOMACH  . aspirin EC 81 MG tablet Take 1 tablet (81 mg total) by mouth 2 (two) times daily. (Patient taking differently: Take 81 mg by mouth daily. )  . cholecalciferol (VITAMIN D) 1000 units tablet Take 1,000 Units by mouth daily.  . cyanocobalamin 500 MCG tablet Take 500 mcg by mouth daily.   Marland Kitchen ibuprofen (ADVIL,MOTRIN) 200  MG tablet Take 400 mg by mouth every 6 (six) hours as needed.  Marland Kitchen losartan-hydrochlorothiazide (HYZAAR) 100-12.5 MG tablet Take 1 tablet by mouth daily.  . metoprolol tartrate (LOPRESSOR) 100 MG tablet Take 1 tablet (100 mg total) by mouth 2 (two) times daily.  . ranitidine (ZANTAC) 75 MG tablet Take 75 mg by mouth as needed for heartburn.     Allergies:   Prednisone and Penicillins   Social History   Socioeconomic History  . Marital status: Married    Spouse name: [name]  . Number of children: 2  . Years of education: 77  . Highest education level: Not on file  Occupational History    Employer: [employer]  Social Needs  . Financial resource strain: Not on file    . Food insecurity:    Worry: Not on file    Inability: Not on file  . Transportation needs:    Medical: Not on file    Non-medical: Not on file  Tobacco Use  . Smoking status: Never Smoker  . Smokeless tobacco: Never Used  Substance and Sexual Activity  . Alcohol use: No  . Drug use: No  . Sexual activity: Yes    Partners: Male  Lifestyle  . Physical activity:    Days per week: Not on file    Minutes per session: Not on file  . Stress: Not on file  Relationships  . Social connections:    Talks on phone: Not on file    Gets together: Not on file    Attends religious service: Not on file    Active member of club or organization: Not on file    Attends meetings of clubs or organizations: Not on file    Relationship status: Not on file  Other Topics Concern  . Not on file  Social History Narrative   Married.  Lives in [place] with husband.  Ambulates with a walker when able.     Family History: The patient's family history includes Diabetes in her brother and sister; Heart attack (age of onset: 28) in her father; Leukemia in her mother.  ROS:   Please see the history of present illness.     All other systems reviewed and are negative.  EKGs/Labs/Other Studies Reviewed:    The following studies were reviewed today:  Echo 09/21/14: Study Conclusions - Left ventricle: The cavity size was normal. Wall thickness was normal. Systolic function was moderately reduced. The estimated ejection fraction was in the range of 35% to 40%. Diffuse hypokinesis. Features are consistent with a pseudonormal left ventricular filling pattern, with concomitant abnormal relaxation and increased filling pressure (grade 2 diastolic dysfunction). - Mitral valve: There was mild regurgitation.  Impressions: - Compared to the prior study, there has been no significant interval change.   EKG:  EKG is ordered today.  The ekg ordered today demonstrates nonspecific ST changes,  unchanged from prior  Recent Labs: 12/06/2017: ALT 13; BUN 24; Creatinine, Ser 1.46; Hemoglobin 14.4; Platelets 118; Potassium 4.2; Sodium 142  Recent Lipid Panel    Component Value Date/Time   CHOL 190 12/27/2016 1029   TRIG 138 12/27/2016 1029   HDL 56 12/27/2016 1029   CHOLHDL 3.4 12/27/2016 1029   VLDL 28 12/27/2016 1029   LDLCALC 106 (H) 12/27/2016 1029    Physical Exam:    VS:  BP 132/80 (BP Location: Left Arm, Patient Position: Sitting, Cuff Size: Normal)   Ht 5\' 2"  (1.575 m)   Wt 147 lb (66.7 kg)  BMI 26.89 kg/m     Wt Readings from Last 3 Encounters:  04/04/18 147 lb (66.7 kg)  03/17/18 146 lb 6.4 oz (66.4 kg)  12/09/17 148 lb 8 oz (67.4 kg)     GEN: Well nourished, well developed in no acute distress HEENT: Normal NECK: No JVD; No carotid bruits LYMPHATICS: No lymphadenopathy CARDIAC: RRR, no murmurs, rubs, gallops RESPIRATORY:  Clear to auscultation without rales, wheezing or rhonchi  ABDOMEN: Soft, non-tender, non-distended MUSCULOSKELETAL:  No edema; No deformity  SKIN: Warm and dry NEUROLOGIC:  Alert and oriented x 3 PSYCHIATRIC:  Normal affect   ASSESSMENT:    1. Essential hypertension   2. Chemotherapy induced cardiomyopathy ([place])   3. Chronic combined systolic and diastolic heart failure (HCC)    PLAN:    In order of problems listed above:  Essential hypertension - Plan: EKG 12-Lead She is doing very well on increased dose of Lopressor at 100 mg twice daily and Hyzaar 100-12.5 mg.  Her blood pressure is running 121/76-144/84 before medication.  We discussed taking her blood pressure 2 hours after her morning medications.  Chemotherapy induced cardiomyopathy (HCC) Chronic combined systolic and diastolic heart failure (HCC) Last echo with an EF of 35 to 40% and grade 2 diastolic dysfunction. No heart failure symptoms, she is doing well and euvolemic on exam.   Follow-up in 1 year   Medication Adjustments/Labs and Tests Ordered: Current  medicines are reviewed at length with the patient today.  Concerns regarding medicines are outlined above.  Orders Placed This Encounter  Procedures  . EKG 12-Lead   Meds ordered this encounter  Medications  . pantoprazole (PROTONIX) 40 MG tablet    Sig: Take 1 tablet (40 mg total) by mouth daily.    Dispense:  30 tablet    Refill:  0    Signed, Ledora Bottcher, [place]  04/04/2018 10:04 AM    [place]

## 2018-04-04 ENCOUNTER — Ambulatory Visit (INDEPENDENT_AMBULATORY_CARE_PROVIDER_SITE_OTHER): Payer: Medicare Other | Admitting: Physician Assistant

## 2018-04-04 ENCOUNTER — Encounter: Payer: Self-pay | Admitting: Physician Assistant

## 2018-04-04 VITALS — BP 132/80 | Ht 62.0 in | Wt 147.0 lb

## 2018-04-04 DIAGNOSIS — I427 Cardiomyopathy due to drug and external agent: Secondary | ICD-10-CM | POA: Insufficient documentation

## 2018-04-04 DIAGNOSIS — H11151 Pinguecula, right eye: Secondary | ICD-10-CM | POA: Diagnosis not present

## 2018-04-04 DIAGNOSIS — T451X5A Adverse effect of antineoplastic and immunosuppressive drugs, initial encounter: Secondary | ICD-10-CM | POA: Diagnosis not present

## 2018-04-04 DIAGNOSIS — I5042 Chronic combined systolic (congestive) and diastolic (congestive) heart failure: Secondary | ICD-10-CM | POA: Diagnosis not present

## 2018-04-04 DIAGNOSIS — H04123 Dry eye syndrome of bilateral lacrimal glands: Secondary | ICD-10-CM | POA: Diagnosis not present

## 2018-04-04 DIAGNOSIS — H16223 Keratoconjunctivitis sicca, not specified as Sjogren's, bilateral: Secondary | ICD-10-CM | POA: Diagnosis not present

## 2018-04-04 DIAGNOSIS — I10 Essential (primary) hypertension: Secondary | ICD-10-CM | POA: Diagnosis not present

## 2018-04-04 DIAGNOSIS — H5713 Ocular pain, bilateral: Secondary | ICD-10-CM | POA: Diagnosis not present

## 2018-04-04 MED ORDER — PANTOPRAZOLE SODIUM 40 MG PO TBEC: 40.0000 mg | DELAYED_RELEASE_TABLET | Freq: Every day | ORAL | 0 refills | Status: DC

## 2018-04-04 NOTE — Patient Instructions (Signed)
Medication Instructions: Your physician recommends that you continue on your current medications as directed. Please refer to the Current Medication list given to you today.  START Pantoprazole (Protonix) 40 mg daily.   Follow-Up: Your physician wants you to follow-up in: 1 year with Dr. Claiborne Billings. You will receive a reminder letter in the mail two months in advance. If you don't receive a letter, please call our office to schedule the follow-up appointment.  If you need a refill on your cardiac medications before your next appointment, please call your pharmacy.

## 2018-04-17 ENCOUNTER — Ambulatory Visit (INDEPENDENT_AMBULATORY_CARE_PROVIDER_SITE_OTHER): Payer: Medicare Other | Admitting: Family Medicine

## 2018-04-17 ENCOUNTER — Encounter: Payer: Self-pay | Admitting: Family Medicine

## 2018-04-17 VITALS — BP 136/80 | HR 56 | Temp 97.6°F | Ht 62.0 in | Wt 147.6 lb

## 2018-04-17 DIAGNOSIS — M1990 Unspecified osteoarthritis, unspecified site: Secondary | ICD-10-CM | POA: Diagnosis not present

## 2018-04-17 DIAGNOSIS — I10 Essential (primary) hypertension: Secondary | ICD-10-CM | POA: Diagnosis not present

## 2018-04-17 DIAGNOSIS — Z23 Encounter for immunization: Secondary | ICD-10-CM | POA: Diagnosis not present

## 2018-04-17 NOTE — Progress Notes (Signed)
   Subjective:    Patient ID: Tonya Wise, female    DOB: [birth year], 77 y.o.   MRN: [id number]  HPI Is here for recheck on her blood pressure.  She has had some slight fluctuations in that and has concerned over it.  She has been checking it at home.  Apparently her cuff at home is accurate.  She would also like to get a flu shot today.  She does have questions about arthritis.  Apparently ibuprofen does cause some GI irritation.  She has not been using Tylenol.   Review of Systems     Objective:   Physical Exam Alert and in no distress otherwise not examined      Assessment & Plan:  Need for influenza vaccination - Plan: Flu vaccine HIGH DOSE PF (Fluzone High dose)  Essential hypertension  Arthritis Today's blood pressure is adequate.  Discussed blood pressure treatment with her explained that were trying to look at the overall blood pressure reading rather than day-to-day.  She did seem to understand that. I then discussed her arthritis.  Recommend physical therapy including range of motion and strengthening exercises.  Tylenol would be the first choice and then try Aleve to see if she has less GI irritation.  Explained that it is a risk-benefit issue using an insane due to GI irritation and possible bleeding.  She expressed understanding of this.

## 2018-07-15 DIAGNOSIS — L819 Disorder of pigmentation, unspecified: Secondary | ICD-10-CM | POA: Diagnosis not present

## 2018-07-15 DIAGNOSIS — D485 Neoplasm of uncertain behavior of skin: Secondary | ICD-10-CM | POA: Diagnosis not present

## 2018-07-15 DIAGNOSIS — L570 Actinic keratosis: Secondary | ICD-10-CM | POA: Diagnosis not present

## 2018-07-15 DIAGNOSIS — L814 Other melanin hyperpigmentation: Secondary | ICD-10-CM | POA: Diagnosis not present

## 2018-07-15 DIAGNOSIS — L821 Other seborrheic keratosis: Secondary | ICD-10-CM | POA: Diagnosis not present

## 2018-07-15 DIAGNOSIS — L578 Other skin changes due to chronic exposure to nonionizing radiation: Secondary | ICD-10-CM | POA: Diagnosis not present

## 2018-07-15 DIAGNOSIS — D229 Melanocytic nevi, unspecified: Secondary | ICD-10-CM | POA: Diagnosis not present

## 2018-08-06 DIAGNOSIS — D385 Neoplasm of uncertain behavior of other respiratory organs: Secondary | ICD-10-CM | POA: Diagnosis not present

## 2018-08-25 ENCOUNTER — Encounter: Payer: Self-pay | Admitting: Family Medicine

## 2018-08-25 ENCOUNTER — Ambulatory Visit (INDEPENDENT_AMBULATORY_CARE_PROVIDER_SITE_OTHER): Payer: Medicare Other | Admitting: Family Medicine

## 2018-08-25 VITALS — BP 142/88 | HR 70 | Temp 98.1°F | Resp 24 | Wt 145.2 lb

## 2018-08-25 DIAGNOSIS — J209 Acute bronchitis, unspecified: Secondary | ICD-10-CM | POA: Diagnosis not present

## 2018-08-25 DIAGNOSIS — H6691 Otitis media, unspecified, right ear: Secondary | ICD-10-CM

## 2018-08-25 MED ORDER — CLARITHROMYCIN 500 MG PO TABS: 500.0000 mg | ORAL_TABLET | Freq: Two times a day (BID) | ORAL | 0 refills | Status: DC

## 2018-08-25 NOTE — Progress Notes (Signed)
   Subjective:    Patient ID: Tonya Wise, female    DOB: 1940/08/02, 78 y.o.   MRN: [id number]  HPI She started having URI symptoms 2 weeks ago with sneezing, cough, rhinorrhea, nasal congestion.  This continued until about a week ago when coughing and congestion got worse.  She then also developed right ear congestion, dizziness and now pain.  She also complains of whitish lesions in her tongue.  She does have a occasionally productive cough.   Review of Systems     Objective:   Physical Exam Alert and in no distress. Tympanic membrane on the left is normal, right shows a red membrane with poor landmarks canals are normal. Pharyngeal area is normal. Neck is supple without adenopathy or thyromegaly. Cardiac exam shows a regular sinus rhythm without murmurs or gallops. Lungs are clear to auscultation.       Assessment & Plan:  Right otitis media, unspecified otitis media type - Plan: clarithromycin (BIAXIN) 500 MG tablet  Acute bronchitis, unspecified organism I explained that I thought she had an otitis media as well as bronchitis.  I will treat her with Biaxin since she is allergic to penicillin.  She will keep me informed as to how she is doing.

## 2018-11-25 ENCOUNTER — Telehealth: Payer: Self-pay | Admitting: Family Medicine

## 2018-11-25 ENCOUNTER — Other Ambulatory Visit: Payer: Self-pay

## 2018-11-25 DIAGNOSIS — I10 Essential (primary) hypertension: Secondary | ICD-10-CM

## 2018-11-25 MED ORDER — LOSARTAN POTASSIUM-HCTZ 100-12.5 MG PO TABS: 1.0000 | ORAL_TABLET | Freq: Every day | ORAL | 3 refills | Status: DC

## 2018-11-25 NOTE — Telephone Encounter (Signed)
[facility] sent refill for losartan/hct 100-12.5 mg oty 90 please send refill to [place], [place]

## 2018-11-26 ENCOUNTER — Telehealth: Payer: Self-pay | Admitting: Internal Medicine

## 2018-11-26 NOTE — Telephone Encounter (Signed)
Returned patient's phone call regarding rescheduling an appointment, left a voicemail. 

## 2018-12-05 ENCOUNTER — Other Ambulatory Visit: Payer: Self-pay

## 2018-12-05 ENCOUNTER — Ambulatory Visit (HOSPITAL_COMMUNITY)
Admission: RE | Admit: 2018-12-05 | Discharge: 2018-12-05 | Disposition: A | Discharge status: Home / Self Care | Payer: Medicare Other | Source: Ambulatory Visit | Attending: Internal Medicine | Admitting: Internal Medicine

## 2018-12-05 ENCOUNTER — Ambulatory Visit (HOSPITAL_COMMUNITY): Admission: RE | Admit: 2018-12-05 | Payer: Medicare Other | Source: Ambulatory Visit

## 2018-12-05 ENCOUNTER — Inpatient Hospital Stay: Payer: Medicare Other | Attending: Internal Medicine

## 2018-12-05 DIAGNOSIS — Z8572 Personal history of non-Hodgkin lymphomas: Secondary | ICD-10-CM | POA: Diagnosis not present

## 2018-12-05 DIAGNOSIS — C8330 Diffuse large B-cell lymphoma, unspecified site: Secondary | ICD-10-CM | POA: Diagnosis not present

## 2018-12-05 DIAGNOSIS — C8333 Diffuse large B-cell lymphoma, intra-abdominal lymph nodes: Secondary | ICD-10-CM | POA: Insufficient documentation

## 2018-12-05 DIAGNOSIS — Z9221 Personal history of antineoplastic chemotherapy: Secondary | ICD-10-CM | POA: Diagnosis not present

## 2018-12-05 DIAGNOSIS — Z79899 Other long term (current) drug therapy: Secondary | ICD-10-CM | POA: Insufficient documentation

## 2018-12-05 LAB — CBC WITH DIFFERENTIAL (CANCER CENTER ONLY)
Abs Immature Granulocytes: 0.02 10*3/uL (ref 0.00–0.07)
Basophils Absolute: 0 10*3/uL (ref 0.0–0.1)
Basophils Relative: 0 %
Eosinophils Absolute: 0.1 10*3/uL (ref 0.0–0.5)
Eosinophils Relative: 1 %
HCT: 47.5 % — ABNORMAL HIGH (ref 36.0–46.0)
Hemoglobin: 15.3 g/dL — ABNORMAL HIGH (ref 12.0–15.0)
Immature Granulocytes: 0 %
Lymphocytes Relative: 24 %
Lymphs Abs: 1.2 10*3/uL (ref 0.7–4.0)
MCH: 28.1 pg (ref 26.0–34.0)
MCHC: 32.2 g/dL (ref 30.0–36.0)
MCV: 87.2 fL (ref 80.0–100.0)
Monocytes Absolute: 0.4 10*3/uL (ref 0.1–1.0)
Monocytes Relative: 8 %
Neutro Abs: 3.3 10*3/uL (ref 1.7–7.7)
Neutrophils Relative %: 67 %
Platelet Count: 130 10*3/uL — ABNORMAL LOW (ref 150–400)
RBC: 5.45 MIL/uL — ABNORMAL HIGH (ref 3.87–5.11)
RDW: 13.9 % (ref 11.5–15.5)
WBC Count: 5 10*3/uL (ref 4.0–10.5)
nRBC: 0 % (ref 0.0–0.2)

## 2018-12-05 LAB — CMP (CANCER CENTER ONLY)
ALT: 10 U/L (ref 0–44)
AST: 14 U/L — ABNORMAL LOW (ref 15–41)
Albumin: 4 g/dL (ref 3.5–5.0)
Alkaline Phosphatase: 53 U/L (ref 38–126)
Anion gap: 8 (ref 5–15)
BUN: 14 mg/dL (ref 8–23)
CO2: 31 mmol/L (ref 22–32)
Calcium: 9.6 mg/dL (ref 8.9–10.3)
Chloride: 103 mmol/L (ref 98–111)
Creatinine: 1.3 mg/dL — ABNORMAL HIGH (ref 0.44–1.00)
GFR, Est AFR Am: 46 mL/min — ABNORMAL LOW (ref >60)
GFR, Estimated: 40 mL/min — ABNORMAL LOW (ref >60)
Glucose, Bld: 109 mg/dL — ABNORMAL HIGH (ref 70–99)
Potassium: 4 mmol/L (ref 3.5–5.1)
Sodium: 142 mmol/L (ref 135–145)
Total Bilirubin: 0.6 mg/dL (ref 0.3–1.2)
Total Protein: 6.5 g/dL (ref 6.5–8.1)

## 2018-12-05 LAB — LACTATE DEHYDROGENASE: LDH: 201 U/L — ABNORMAL HIGH (ref 98–192)

## 2018-12-05 MED ORDER — SODIUM CHLORIDE (PF) 0.9 % IJ SOLN
INTRAMUSCULAR | Status: AC
  Filled 2018-12-05: qty 50

## 2018-12-05 MED ORDER — IOHEXOL 300 MG/ML  SOLN
100.0000 mL | Freq: Once | INTRAMUSCULAR | Status: AC | PRN
  Administered 2018-12-05: 100 mL via INTRAVENOUS

## 2018-12-08 ENCOUNTER — Other Ambulatory Visit: Payer: Self-pay

## 2018-12-08 ENCOUNTER — Inpatient Hospital Stay (HOSPITAL_BASED_OUTPATIENT_CLINIC_OR_DEPARTMENT_OTHER): Payer: Medicare Other | Admitting: Internal Medicine

## 2018-12-08 ENCOUNTER — Encounter: Payer: Self-pay | Admitting: Internal Medicine

## 2018-12-08 VITALS — BP 155/69 | HR 59 | Temp 97.8°F | Resp 20 | Ht 62.0 in | Wt 145.6 lb

## 2018-12-08 DIAGNOSIS — C8333 Diffuse large B-cell lymphoma, intra-abdominal lymph nodes: Secondary | ICD-10-CM

## 2018-12-08 DIAGNOSIS — D696 Thrombocytopenia, unspecified: Secondary | ICD-10-CM

## 2018-12-08 DIAGNOSIS — Z8572 Personal history of non-Hodgkin lymphomas: Secondary | ICD-10-CM | POA: Diagnosis not present

## 2018-12-08 DIAGNOSIS — Z9221 Personal history of antineoplastic chemotherapy: Secondary | ICD-10-CM | POA: Diagnosis not present

## 2018-12-08 DIAGNOSIS — Z79899 Other long term (current) drug therapy: Secondary | ICD-10-CM | POA: Diagnosis not present

## 2018-12-08 NOTE — Progress Notes (Signed)
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  PRIOR THERAPY:   Non-Hodgkins lymphoma ([place]) (Resolved)   03/27/2013 - 03/31/2013 Hospital Admission    Admitted to [facility] for profound anemia (Hgb 6.4).  C/o fevers, drenching nightsweats, weight lost.  Negative colonoscopy and EGD.  CT of C/A/P demonstrated significant lymphadenopathy suspcious of ympha.      03/31/2013 Initial Diagnosis    Biopsy of R inguinal lymph node revealed Non-Hodgkins lymphoma. Diffuse Large B-cell Lymphoma    04/10/2013 - 04/24/2013 Hospital Admission    Hypercalcemia, renal failure and dehydration.  Started min-RCHOP.     04/15/2013 Bone Marrow Biopsy    Involvement with Diffuse Large B-cell Lymphoma. Stage IV NHL w B-symptoms.     04/18/2013 - 04/18/2013 Chemotherapy    Mini R-CHOP Cycle #1 on 04/18/2013. Dose reduced to poor nutrion. It consisted on Cytoxan 400 mg/m2,Doxorubicin 25 mg/m2, Rituximab 375 mg/m2, Vincristine 93m.  Prednisone 100 mg daily for 9/13 - 9/16.   Received neupogen on 9/21, 9/27 -9/30.      04/25/2013 - 04/29/2013 Hospital Admission    Admitted due to worsening mouth sores/oral thrush, odynophagia, failure to thrive.  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  [facility](Dr. CGwynneth Munson and discharged to home on 05/19/2013.      06/01/2013 - 06/01/2013 Chemotherapy    Mini R-CHO #2 dosed as above.  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  Received neulasta shot on 06/02/13    06/16/2013 Cancer Staging    Re-staging PET/CT demonstrated significant interval reduction in axillary lymphadenopathy.  There was 1 small residual right axillary lymph node measuring 16 x 12 mm which showed FDG uptake.  Signifcant  reduction in mediastinal and hilar lymphadenopathy.     06/16/2013 Imaging    CT of chest noted left-sided pulmonary embolim. Started on lovenox to coumadin bridge.      06/22/2013 - 06/22/2013 Chemotherapy    Mini-R-CHO #3.  Received Neulasta on 06/23/2013.      07/16/2013 Imaging    2-D Echocardiogram demonstrates 35-45% EF with Grade 2 diastolic dysfunction.  Doxorubicin discontinued with consultation by cardiology (Dr. KClaiborne Billings.      08/19/2013 - 08/21/2013 Chemotherapy    Mini-R-CEO #4.  Etoposide 25 mg/m2 instead of doxo due to above.  Etoposide 583mbid on day 2/3.  On day 3 (08/21/2013) she received 1 of two oral doses of ectoposide.     08/21/2013 - 08/26/2013 Hospital Admission    [facility] and had influenzae A and pneumonia. Completed tamiflu and oral antibiotics.     09/25/2013 - 09/27/2013 Chemotherapy    Mini-R-CEO #5. Etoposide 25 mg/m2 25 mg bid on days 2/3.     10/16/2013 - 10/19/2013 Chemotherapy    Mini-R-CEO # 6. Etoposide 25 mg/m2 bid on days 2/3. Neulasta on day#4.     11/05/2013 Imaging    Interval resolution of the hypermetabolism identified within the enlarged right axillary lymph node seen previously. This lymph node has also decreased markedly in size in the interval. No new hypermetabolic lesions in the neck, chest, abdomen, or  pelvis.     11/19/2013 Bone Marrow Biopsy    There is no evidence of a B-cell lymphoproliferative process in this material.  Normal cytogenetics.     04/16/2014 Imaging    CT scan Chest, abdomen: No evidence of recurrent lymphoma within the chest or abdomen.   Significant decrease in splenomegaly since prior exam.      CURRENT THERAPY: Observation.  INTERVAL HISTORY: Tonya Wise 78 y.o. female returns to the clinic today for annual follow-up visit.  The patient is feeling fine today with no concerning complaints.  She denied having any chest pain, shortness of breath, cough or hemoptysis.  She  denied having any recent weight loss or night sweats.  She has no nausea, vomiting, diarrhea or constipation.  She denied having any headache or visual changes.  The patient is here today for evaluation after repeating CT scan of the chest, abdomen and pelvis for restaging of her disease.   MEDICAL HISTORY: Past Medical History:  Diagnosis Date   Allergy    Anemia    Ankle fracture, left    Chemotherapy induced cardiomyopathy ([place])    a. 08/2014: echo showing EF of 35-40% with Grade 2 DD and mild MR   Diastolic dysfunction, grade I 04/12/2013   GERD (gastroesophageal reflux disease)    Tums or Maalox   Heart murmur    Hypercalcemia 03/27/2013   Hypertension    Non-Hodgkins lymphoma ([place]) 04/06/2013   Osteopenia    Renal insufficiency     ALLERGIES:  is allergic to prednisone and penicillins.  MEDICATIONS:  Current Outpatient Medications  Medication Sig Dispense Refill   alendronate (FOSAMAX) 70 MG tablet TAKE 1 TABLET (70 MG TOTAL) BY MOUTH EVERY 7 DAYS. TAKE WITH A FULL GLASS OF WATER ON AN EMPTY STOMACH 12 tablet 3   aspirin EC 81 MG tablet Take 1 tablet (81 mg total) by mouth 2 (two) times daily. (Patient taking differently: Take 81 mg by mouth daily. ) 90 tablet 0   cholecalciferol (VITAMIN D) 1000 units tablet Take 1,000 Units by mouth daily.     clarithromycin (BIAXIN) 500 MG tablet Take 1 tablet (500 mg total) by mouth 2 (two) times daily. 20 tablet 0   cyanocobalamin 500 MCG tablet Take 500 mcg by mouth daily.      ibuprofen (ADVIL,MOTRIN) 200 MG tablet Take 400 mg by mouth every 6 (six) hours as needed.     losartan-hydrochlorothiazide (HYZAAR) 100-12.5 MG tablet Take 1 tablet by mouth daily. 90 tablet 3   metoprolol tartrate (LOPRESSOR) 100 MG tablet Take 1 tablet (100 mg total) by mouth 2 (two) times daily. 180 tablet 3   pantoprazole (PROTONIX) 40 MG tablet Take 1 tablet (40 mg total) by mouth daily. 30 tablet 0   ranitidine (ZANTAC) 75 MG tablet  Take 75 mg by mouth as needed for heartburn.     No current facility-administered medications for this visit.     SURGICAL HISTORY:  Past Surgical History:  Procedure Laterality Date   ABDOMINAL HYSTERECTOMY     APPENDECTOMY     BACK SURGERY     LOWER BACK TWICE   COLONOSCOPY  2008   ESOPHAGOGASTRODUODENOSCOPY N/A 03/29/2013   Procedure: ESOPHAGOGASTRODUODENOSCOPY (EGD);  Surgeon: Juanita Craver, MD;  [facility];  Service: Endoscopy;  Laterality: N/A;   INGUINAL LYMPH NODE BIOPSY Right 03/31/2013   Procedure: INGUINAL LYMPH NODE BIOPSY;  Surgeon: Gwenyth Ober, MD;  Location: [facility];  Service: General;  Laterality: Right;   ORIF ANKLE FRACTURE Left 11/03/2015   Procedure: OPEN REDUCTION INTERNAL FIXATION (ORIF) LEFT ANKLE FRACTURE;  Surgeon: Wylene Simmer, MD;  Location: [facility];  Service: Orthopedics;  Laterality: Left;   SPINE SURGERY      REVIEW OF SYSTEMS:  A comprehensive review of systems was negative.   PHYSICAL EXAMINATION: General appearance: alert, cooperative and no distress Head: Normocephalic, without obvious abnormality, atraumatic Neck: no adenopathy, no JVD, supple, symmetrical, trachea midline and thyroid not enlarged, symmetric, no tenderness/mass/nodules Lymph nodes: Cervical, supraclavicular, and axillary nodes normal. Resp: clear to auscultation bilaterally Back: symmetric, no curvature. ROM normal. No CVA tenderness. Cardio: regular rate and rhythm, S1, S2 normal, no murmur, click, rub or gallop GI: soft, non-tender; bowel sounds normal; no masses,  no organomegaly Extremities: extremities normal, atraumatic, no cyanosis or edema  ECOG PERFORMANCE STATUS: 1 - Symptomatic but completely ambulatory  Blood pressure (!) 155/69, pulse (!) 59, temperature 97.8 F (36.6 C), temperature source Oral, resp. rate 20, height '5\' 2"'  (1.575 m), weight 145 lb 9.6 oz (66 kg), SpO2 97 %.  LABORATORY DATA: Lab Results  Component Value Date   WBC  5.0 12/05/2018   HGB 15.3 (H) 12/05/2018   HCT 47.5 (H) 12/05/2018   MCV 87.2 12/05/2018   PLT 130 (L) 12/05/2018      Chemistry      Component Value Date/Time   NA 142 12/05/2018 1003   NA 141 12/12/2016 0813   K 4.0 12/05/2018 1003   K 4.0 12/12/2016 0813   CL 103 12/05/2018 1003   CO2 31 12/05/2018 1003   CO2 26 12/12/2016 0813   BUN 14 12/05/2018 1003   BUN 20.3 12/12/2016 0813   CREATININE 1.30 (H) 12/05/2018 1003   CREATININE 1.46 (H) 08/05/2017 1639   CREATININE 1.3 (H) 12/12/2016 0813      Component Value Date/Time   CALCIUM 9.6 12/05/2018 1003   CALCIUM 8.9 12/12/2016 0813   ALKPHOS 53 12/05/2018 1003   ALKPHOS 55 12/12/2016 0813   AST 14 (L) 12/05/2018 1003   AST 13 12/12/2016 0813   ALT 10 12/05/2018 1003   ALT 10 12/12/2016 0813   BILITOT 0.6 12/05/2018 1003   BILITOT 0.55 12/12/2016 0813       RADIOGRAPHIC STUDIES: Ct Chest W Contrast  Result Date: 12/05/2018 CLINICAL DATA:  Diffuse large B-cell lymphoma. EXAM: CT CHEST, ABDOMEN, AND PELVIS WITH CONTRAST TECHNIQUE: Multidetector CT imaging of the chest, abdomen and pelvis was performed following the standard protocol during bolus administration of intravenous contrast. CONTRAST:  123m OMNIPAQUE IOHEXOL 300 MG/ML  SOLN COMPARISON:  12/06/2017 FINDINGS: CT CHEST FINDINGS Cardiovascular: The heart size is normal. No substantial pericardial effusion. Mediastinum/Nodes: No mediastinal lymphadenopathy. There is no hilar lymphadenopathy. The esophagus has normal imaging features. There is no axillary lymphadenopathy. Lungs/Pleura: The central tracheobronchial airways are patent. 3 mm nodule in the lingula is unchanged in the interval, consistent with benign etiology. No suspicious pulmonary nodule or mass. No focal airspace consolidation. No pleural effusion. Musculoskeletal: No worrisome lytic or sclerotic osseous abnormality. CT ABDOMEN PELVIS FINDINGS Hepatobiliary: No suspicious focal abnormality within the liver  parenchyma. There is no evidence for gallstones, gallbladder wall thickening, or pericholecystic fluid. No intrahepatic or extrahepatic biliary dilation. Pancreas: No focal mass lesion. No dilatation of the main duct. No intraparenchymal cyst. No peripancreatic edema. Spleen: No splenomegaly. No focal mass lesion. Adrenals/Urinary Tract: No adrenal nodule or mass. Kidneys unremarkable. No evidence for hydroureter. The urinary bladder appears normal for the degree of distention. Stomach/Bowel: Stomach is unremarkable. No gastric wall thickening. No evidence of outlet obstruction. Duodenum is normally positioned as is the ligament of Treitz.  No small bowel wall thickening. No small bowel dilatation. The terminal ileum is normal. The appendix is not visualized, but there is no edema or inflammation in the region of the cecum. No gross colonic mass. No colonic wall thickening. Vascular/Lymphatic: There is abdominal aortic atherosclerosis without aneurysm. There is no gastrohepatic or hepatoduodenal ligament lymphadenopathy. No intraperitoneal or retroperitoneal lymphadenopathy. No pelvic sidewall lymphadenopathy. Reproductive: The uterus is unremarkable.  There is no adnexal mass. Other: No intraperitoneal free fluid. Musculoskeletal: No worrisome lytic or sclerotic osseous abnormality. Surgical clips noted right groin. Surgical hardware evident in the lumbar spine. IMPRESSION: 1. Stable exam. No lymphadenopathy in the chest, abdomen, or pelvis. 2.  Aortic Atherosclerois (ICD10-170.0) Electronically Signed   By: Misty Stanley M.D.   On: 12/05/2018 16:01   Ct Abdomen Pelvis W Contrast  Result Date: 12/05/2018 CLINICAL DATA:  Diffuse large B-cell lymphoma. EXAM: CT CHEST, ABDOMEN, AND PELVIS WITH CONTRAST TECHNIQUE: Multidetector CT imaging of the chest, abdomen and pelvis was performed following the standard protocol during bolus administration of intravenous contrast. CONTRAST:  158m OMNIPAQUE IOHEXOL 300 MG/ML   SOLN COMPARISON:  12/06/2017 FINDINGS: CT CHEST FINDINGS Cardiovascular: The heart size is normal. No substantial pericardial effusion. Mediastinum/Nodes: No mediastinal lymphadenopathy. There is no hilar lymphadenopathy. The esophagus has normal imaging features. There is no axillary lymphadenopathy. Lungs/Pleura: The central tracheobronchial airways are patent. 3 mm nodule in the lingula is unchanged in the interval, consistent with benign etiology. No suspicious pulmonary nodule or mass. No focal airspace consolidation. No pleural effusion. Musculoskeletal: No worrisome lytic or sclerotic osseous abnormality. CT ABDOMEN PELVIS FINDINGS Hepatobiliary: No suspicious focal abnormality within the liver parenchyma. There is no evidence for gallstones, gallbladder wall thickening, or pericholecystic fluid. No intrahepatic or extrahepatic biliary dilation. Pancreas: No focal mass lesion. No dilatation of the main duct. No intraparenchymal cyst. No peripancreatic edema. Spleen: No splenomegaly. No focal mass lesion. Adrenals/Urinary Tract: No adrenal nodule or mass. Kidneys unremarkable. No evidence for hydroureter. The urinary bladder appears normal for the degree of distention. Stomach/Bowel: Stomach is unremarkable. No gastric wall thickening. No evidence of outlet obstruction. Duodenum is normally positioned as is the ligament of Treitz. No small bowel wall thickening. No small bowel dilatation. The terminal ileum is normal. The appendix is not visualized, but there is no edema or inflammation in the region of the cecum. No gross colonic mass. No colonic wall thickening. Vascular/Lymphatic: There is abdominal aortic atherosclerosis without aneurysm. There is no gastrohepatic or hepatoduodenal ligament lymphadenopathy. No intraperitoneal or retroperitoneal lymphadenopathy. No pelvic sidewall lymphadenopathy. Reproductive: The uterus is unremarkable.  There is no adnexal mass. Other: No intraperitoneal free fluid.  Musculoskeletal: No worrisome lytic or sclerotic osseous abnormality. Surgical clips noted right groin. Surgical hardware evident in the lumbar spine. IMPRESSION: 1. Stable exam. No lymphadenopathy in the chest, abdomen, or pelvis. 2.  Aortic Atherosclerois (ICD10-170.0) Electronically Signed   By: EMisty StanleyM.D.   On: 12/05/2018 16:01    ASSESSMENT AND PLAN:  This is a very pleasant 78years old white female with history of stage IV large B-cell non-Hodgkin lymphoma status post systemic chemotherapy with CHOP/Rituxan then modified treatment secondary to steroid-induced psychosis as well as cardiomyopathy. She has been on observation since that time.  The patient is doing fine with no concerning complaints today. Repeat CT scan of the chest, abdomen and pelvis showed no concerning findings for disease recurrence. I discussed the scan results with the patient. I recommended for her to continue on observation with repeat  blood work in CT scan of the Chest, Abdomen and pelvis in 1 year. She was advised to call immediately if she has any concerning symptoms in the interval. The patient voices understanding of current disease status and treatment options and is in agreement with the current care plan. All questions were answered. The patient knows to call the clinic with any problems, questions or concerns. We can certainly see the patient much sooner if necessary. I spent 10 minutes counseling the patient face to face. The total time spent in the appointment was 15 minutes.  Disclaimer: This note was dictated with voice recognition software. Similar sounding words can inadvertently be transcribed and may not be corrected upon review.

## 2018-12-09 ENCOUNTER — Telehealth: Payer: Self-pay | Admitting: Internal Medicine

## 2018-12-09 NOTE — Telephone Encounter (Signed)
Scheduled appt per 5/11 los - f/u and scan in 1 year - reminder letter mailed with appt date and time

## 2019-01-03 ENCOUNTER — Other Ambulatory Visit: Payer: Self-pay | Admitting: Family Medicine

## 2019-01-14 ENCOUNTER — Other Ambulatory Visit: Payer: Self-pay

## 2019-01-14 ENCOUNTER — Ambulatory Visit (INDEPENDENT_AMBULATORY_CARE_PROVIDER_SITE_OTHER): Payer: Medicare Other | Admitting: Family Medicine

## 2019-01-14 ENCOUNTER — Encounter: Payer: Self-pay | Admitting: Family Medicine

## 2019-01-14 VITALS — BP 110/70 | HR 56 | Temp 98.1°F | Ht 62.0 in | Wt 146.4 lb

## 2019-01-14 DIAGNOSIS — E785 Hyperlipidemia, unspecified: Secondary | ICD-10-CM | POA: Diagnosis not present

## 2019-01-14 DIAGNOSIS — I427 Cardiomyopathy due to drug and external agent: Secondary | ICD-10-CM | POA: Diagnosis not present

## 2019-01-14 DIAGNOSIS — Z8572 Personal history of non-Hodgkin lymphomas: Secondary | ICD-10-CM

## 2019-01-14 DIAGNOSIS — I5042 Chronic combined systolic (congestive) and diastolic (congestive) heart failure: Secondary | ICD-10-CM

## 2019-01-14 DIAGNOSIS — D696 Thrombocytopenia, unspecified: Secondary | ICD-10-CM | POA: Diagnosis not present

## 2019-01-14 DIAGNOSIS — Z131 Encounter for screening for diabetes mellitus: Secondary | ICD-10-CM | POA: Diagnosis not present

## 2019-01-14 DIAGNOSIS — M810 Age-related osteoporosis without current pathological fracture: Secondary | ICD-10-CM

## 2019-01-14 DIAGNOSIS — T451X5A Adverse effect of antineoplastic and immunosuppressive drugs, initial encounter: Secondary | ICD-10-CM

## 2019-01-14 DIAGNOSIS — I10 Essential (primary) hypertension: Secondary | ICD-10-CM | POA: Diagnosis not present

## 2019-01-14 DIAGNOSIS — E7439 Other disorders of intestinal carbohydrate absorption: Secondary | ICD-10-CM

## 2019-01-14 DIAGNOSIS — K219 Gastro-esophageal reflux disease without esophagitis: Secondary | ICD-10-CM | POA: Insufficient documentation

## 2019-01-14 LAB — HEMOGLOBIN A1C: Hemoglobin A1C: 5.4

## 2019-01-14 MED ORDER — METOPROLOL TARTRATE 100 MG PO TABS: 100.0000 mg | ORAL_TABLET | Freq: Two times a day (BID) | ORAL | 3 refills | Status: DC

## 2019-01-14 MED ORDER — ALENDRONATE SODIUM 70 MG PO TABS: ORAL_TABLET | ORAL | 0 refills | Status: DC

## 2019-01-14 MED ORDER — LOSARTAN POTASSIUM-HCTZ 100-12.5 MG PO TABS: 1.0000 | ORAL_TABLET | Freq: Every day | ORAL | 3 refills | Status: DC

## 2019-01-14 NOTE — Patient Instructions (Signed)
  Tonya Wise , Thank you for taking time to come for your Medicare Wellness Visit. I appreciate your ongoing commitment to your health goals. Please review the following plan we discussed and let me know if I can assist you in the future.   These are the goals we discussed: Goals   None     This is a list of the screening recommended for you and due dates:  Health Maintenance  Topic Date Due  . Flu Shot  02/28/2019  . Tetanus Vaccine  07/17/2024  . DEXA scan (bone density measurement)  Completed  . Pneumonia vaccines  Completed  . Complete foot exam   Discontinued  . Hemoglobin A1C  Discontinued  . Eye exam for diabetics  Discontinued

## 2019-01-14 NOTE — Progress Notes (Signed)
Tonya Wise is a 78 y.o. female who presents for annual wellness visit and follow-up on chronic medical conditions.  She has no particular concerns or complaints.  She does have a previous history of B-cell lymphoma and resolving chemotherapy-induced cardiomyopathy.  She is followed by cardiology for this.  Also blood work does show evidence of thrombocytopenia.  She sees Dr. Earlie Server regularly.  While in the hospital her blood sugar did get elevated requiring use of insulin however since then she has had no evidence of diabetes.  She does have history of reflux but presently is on no medications.  She continues on Fosamax and is having no difficulty with that.  Social and family history was reviewed. Immunizations and Health Maintenance Immunization History  Administered Date(s) Administered  . Influenza Split 04/12/2011, 04/25/2012  . Influenza, High Dose Seasonal PF 04/21/2014, 04/07/2015, 04/24/2016, 04/22/2017, 04/17/2018  . Influenza,inj,Quad PF,6+ Mos 04/11/2013  . Pneumococcal Conjugate-13 12/27/2016  . Pneumococcal Polysaccharide-23 04/15/2006, 04/11/2013  . Tdap 07/17/2014  . Zoster 04/15/2006  . Zoster Recombinat (Shingrix) 09/05/2017, 11/23/2017   Health Maintenance Due  Topic Date Due  . FOOT EXAM  04/24/1951  . OPHTHALMOLOGY EXAM  04/24/1951  . HEMOGLOBIN A1C  09/26/2013    Last Pap smear: aged out  Last mammogram: aged out  Last colonoscopy: 10/11/11 Last DEXA: 01/25/17 Dentist: q six months Ophtho: Dr Herbert Deaner less than six months Exercise: walking yard work  Other doctors caring for patient include: Dr. Earlie Server oncology, Claiborne Billings cardio  Advanced directives:No info given    Depression screen:  See questionnaire below.  Depression screen [facility] 2/9 01/14/2019 01/17/2018 12/27/2016 07/13/2014  Decreased Interest 0 0 0 0  Down, Depressed, Hopeless 0 0 0 0  PHQ - 2 Score 0 0 0 0  Some recent data might be hidden    Fall Risk Screen: see questionnaire below. Fall Risk   01/14/2019 01/17/2018 12/27/2016 07/13/2014 02/05/2014  Falls in the past year? 0 No No No No    ADL screen:  See questionnaire below Functional Status Survey: Is the patient deaf or have difficulty hearing?: No Does the patient have difficulty seeing, even when wearing glasses/contacts?: No Does the patient have difficulty concentrating, remembering, or making decisions?: No Does the patient have difficulty walking or climbing stairs?: No Does the patient have difficulty dressing or bathing?: No Does the patient have difficulty doing errands alone such as visiting a doctor's office or shopping?: No   Review of Systems Constitutional: -, -unexpected weight change, -anorexia, -fatigue ENT: -runny nose, -ear pain, -sore throat,  Cardiology:  -chest pain, -palpitations, -orthopnea, Respiratory: -cough, -shortness of breath, -dyspnea on exertion, -wheezing,  Gastroenterology: -abdominal pain, -nausea, -vomiting, -diarrhea, -constipation, -dysphagia Hematology: -bleeding or bruising problems Musculoskeletal: -arthralgias, -myalgias, -joint swelling, -back pain, - Ophthalmology: -vision changes,  Urology: -dysuria, -difficulty urinating,  -urinary frequency, -urgency, incontinence Neurology: -, -numbness, , -memory loss, -falls, -dizziness  PHYSICAL EXAM:   General Appearance: Alert, cooperative, no distress, appears stated age Head: Normocephalic, without obvious abnormality, atraumatic Eyes: PERRL, conjunctiva/corneas clear, EOM's intact, fundi benign Ears: Normal TM's and external ear canals Nose: Nares normal, mucosa normal, no drainage or sinus tenderness Throat: Lips, mucosa, and tongue normal; teeth and gums normal Neck: Supple, no lymphadenopathy;  thyroid:  no enlargement/tenderness/nodules; no carotid bruit or JVD Lungs: Clear to auscultation bilaterally without wheezes, rales or ronchi; respirations unlabored Heart: Regular rate and rhythm, S1 and S2 normal, no murmur, rubor  gallop Abdomen: Soft, non-tender, nondistended, normoactive bowel sounds,  no  masses, no hepatosplenomegaly Extremities: No clubbing, cyanosis or edema Pulses: 2+ and symmetric all extremities Skin:  Skin color, texture, turgor normal, no rashes or lesions Lymph nodes: Cervical, supraclavicular, nodes normal Neurologic:  CNII-XII intact, normal strength, sensation and gait; reflexes 2+ and symmetric throughout Psych: Normal mood, affect, hygiene and grooming. A1c is 5.4  ASSESSMENT/PLAN: Glucose intolerance - Plan: Continue to monitS  Essential hypertension - Plan: metoprolol tartrate (LOPRESSOR) 100 MG tablet, losartan-hydrochlorothiazide (HYZAAR) 100-12.5 MG tablet, .  Age-related osteoporosis without current pathological fracture - Plan: alendronate (FOSAMAX) 70 MG tablet, .  Hyperlipidemia, unspecified hyperlipidemia type - Plan: Continue to monitor  Chronic combined systolic and diastolic heart failure (HCC) -  Chemotherapy induced cardiomyopathy (HCC)  History of B-cell lymphoma -   Thrombocytopenia (HCC) -  Gastroesophageal reflux disease, esophagitis presence not specified -      Discussed monthly self breast exams and yearly mammograms; at least 30 minutes of aerobic activity at least 5 days/week and weight-bearing exercise 2x/week; proper sunscreen use reviewed; healthy diet, including goals of calcium and vitamin D intake and alcohol recommendations (less than or equal to 1 drink/day) reviewed; regular seatbelt use; changing batteries in smoke detectors.  Immunization recommendations discussed.  Colonoscopy recommendations reviewed   Medicare Attestation I have personally reviewed: The patient's medical and social history Their use of alcohol, tobacco or illicit drugs Their current medications and supplements The patient's functional ability including ADLs,fall risks, home safety risks, cognitive, and hearing and visual impairment Diet and physical  activities Evidence for depression or mood disorders  The patient's weight, height, and BMI have been recorded in the chart.  I have made referrals, counseling, and provided education to the patient based on review of the above and I have provided the patient with a written personalized care plan for preventive services.     Jill Alexanders, MD   01/14/2019

## 2019-02-11 DIAGNOSIS — H35033 Hypertensive retinopathy, bilateral: Secondary | ICD-10-CM | POA: Diagnosis not present

## 2019-02-11 DIAGNOSIS — Z961 Presence of intraocular lens: Secondary | ICD-10-CM | POA: Diagnosis not present

## 2019-02-11 DIAGNOSIS — H35372 Puckering of macula, left eye: Secondary | ICD-10-CM | POA: Diagnosis not present

## 2019-02-11 DIAGNOSIS — H04123 Dry eye syndrome of bilateral lacrimal glands: Secondary | ICD-10-CM | POA: Diagnosis not present

## 2019-02-11 LAB — HM DIABETES EYE EXAM

## 2019-03-18 DIAGNOSIS — H0288A Meibomian gland dysfunction right eye, upper and lower eyelids: Secondary | ICD-10-CM | POA: Diagnosis not present

## 2019-03-18 DIAGNOSIS — H0288B Meibomian gland dysfunction left eye, upper and lower eyelids: Secondary | ICD-10-CM | POA: Diagnosis not present

## 2019-03-18 DIAGNOSIS — H04123 Dry eye syndrome of bilateral lacrimal glands: Secondary | ICD-10-CM | POA: Diagnosis not present

## 2019-03-18 DIAGNOSIS — H16223 Keratoconjunctivitis sicca, not specified as Sjogren's, bilateral: Secondary | ICD-10-CM | POA: Diagnosis not present

## 2019-04-02 ENCOUNTER — Other Ambulatory Visit: Payer: Medicare Other

## 2019-04-09 ENCOUNTER — Other Ambulatory Visit: Payer: Self-pay

## 2019-04-09 ENCOUNTER — Other Ambulatory Visit (INDEPENDENT_AMBULATORY_CARE_PROVIDER_SITE_OTHER): Payer: Medicare Other

## 2019-04-09 DIAGNOSIS — Z23 Encounter for immunization: Secondary | ICD-10-CM | POA: Diagnosis not present

## 2019-06-18 ENCOUNTER — Telehealth: Payer: Self-pay

## 2019-06-18 ENCOUNTER — Other Ambulatory Visit: Payer: Self-pay

## 2019-06-18 DIAGNOSIS — M810 Age-related osteoporosis without current pathological fracture: Secondary | ICD-10-CM

## 2019-06-18 MED ORDER — ALENDRONATE SODIUM 70 MG PO TABS: ORAL_TABLET | ORAL | 0 refills | Status: DC

## 2019-06-18 NOTE — Telephone Encounter (Signed)
Received fax from [facility] on [address]. For a refill on Alendronate 70 mg #12 pt. last apt. Was 01/14/19.

## 2019-06-18 NOTE — Telephone Encounter (Signed)
Done KH 

## 2019-08-26 ENCOUNTER — Other Ambulatory Visit: Payer: Self-pay

## 2019-08-26 ENCOUNTER — Encounter: Payer: Self-pay | Admitting: Family Medicine

## 2019-08-26 ENCOUNTER — Ambulatory Visit (INDEPENDENT_AMBULATORY_CARE_PROVIDER_SITE_OTHER): Payer: Medicare Other | Admitting: Family Medicine

## 2019-08-26 VITALS — BP 126/82 | HR 73 | Temp 97.8°F | Wt 147.8 lb

## 2019-08-26 DIAGNOSIS — M7581 Other shoulder lesions, right shoulder: Secondary | ICD-10-CM | POA: Diagnosis not present

## 2019-08-26 MED ORDER — LIDOCAINE HCL 2 % IJ SOLN
2.0000 mL | Freq: Once | INTRAMUSCULAR | Status: AC
  Administered 2019-08-26: 16:00:00 40 mg via INTRADERMAL

## 2019-08-26 MED ORDER — TRIAMCINOLONE ACETONIDE 40 MG/ML IJ SUSP
40.0000 mg | Freq: Once | INTRAMUSCULAR | Status: AC
  Administered 2019-08-26: 17:00:00 40 mg via INTRAMUSCULAR

## 2019-08-26 MED ORDER — LIDOCAINE HCL 1 % IJ SOLN: 10.0000 mL | Freq: Once | INTRAMUSCULAR | Status: DC

## 2019-08-26 NOTE — Addendum Note (Signed)
Addended by: Elyse Jarvis on: 08/26/2019 04:31 PM   Modules accepted: Orders

## 2019-08-26 NOTE — Progress Notes (Signed)
   Subjective:    Patient ID: Tonya Wise, female    DOB: 1941/01/10, 79 y.o.   MRN: [id number]  HPI She complains of a 59-month history of right shoulder pain that occurred after she of raking of leaves in the yard and lifting some heavy objects.  She has been intermittently using heat and Tylenol.  No particular motions make it worse.  Now seems to be occurring on a daily basis.  She does complain of the pain radiating up into her neck but not down into her arms.  No numbness, tingling or weakness.   Review of Systems     Objective:   Physical Exam Full motion of the shoulder.  There is pain on abduction.  Drop arm test also cause pain.  Neer's and Hawkins tests cause pain.  Negative sulcus sign.  No joint laxity is noted.       Assessment & Plan:  Rotator cuff tendinitis, right I explained that I thought her symptoms were from tendinitis.  Explained that I think the best would be to give an injection to help quiet this down's.  She was comfortable with that.  40 mg of Kenalog and 3 cc of Xylocaine was injected into the subacromial bursa without difficulty.  She did have some local discomfort from the shot but did state that she noted some quick relief of her underlying shoulder pain. Discussed follow-up on this based on how long she was pain-free.  Discussed x-rays, reinjection, possible referral.  She then asked questions about continued difficulty with back pain.  I recommended physical therapy with range of motion, stretching and strength

## 2019-09-09 ENCOUNTER — Telehealth: Payer: Self-pay | Admitting: Family Medicine

## 2019-09-09 NOTE — Telephone Encounter (Signed)
Pt called and is requesting a refill on her fosamax please send to the [id number] - [place], [place]

## 2019-09-10 ENCOUNTER — Other Ambulatory Visit: Payer: Self-pay

## 2019-09-10 ENCOUNTER — Encounter: Payer: Self-pay | Admitting: Family Medicine

## 2019-09-10 ENCOUNTER — Ambulatory Visit (INDEPENDENT_AMBULATORY_CARE_PROVIDER_SITE_OTHER): Payer: Medicare Other | Admitting: Family Medicine

## 2019-09-10 VITALS — BP 140/82 | HR 78 | Temp 96.4°F | Wt 143.8 lb

## 2019-09-10 DIAGNOSIS — N3001 Acute cystitis with hematuria: Secondary | ICD-10-CM | POA: Diagnosis not present

## 2019-09-10 DIAGNOSIS — M810 Age-related osteoporosis without current pathological fracture: Secondary | ICD-10-CM

## 2019-09-10 DIAGNOSIS — R42 Dizziness and giddiness: Secondary | ICD-10-CM

## 2019-09-10 LAB — POCT URINALYSIS DIP (PROADVANTAGE DEVICE)
Bilirubin, UA: NEGATIVE
Glucose, UA: NEGATIVE mg/dL
Ketones, POC UA: NEGATIVE mg/dL
Nitrite, UA: NEGATIVE
Protein Ur, POC: NEGATIVE mg/dL
Specific Gravity, Urine: 1.015
Urobilinogen, Ur: 0.2
pH, UA: 6 (ref 5.0–8.0)

## 2019-09-10 MED ORDER — ALENDRONATE SODIUM 70 MG PO TABS: ORAL_TABLET | ORAL | 0 refills | Status: DC

## 2019-09-10 MED ORDER — SULFAMETHOXAZOLE-TRIMETHOPRIM 800-160 MG PO TABS: 1.0000 | ORAL_TABLET | Freq: Two times a day (BID) | ORAL | 0 refills | Status: DC

## 2019-09-10 NOTE — Progress Notes (Signed)
   Subjective:    Patient ID: LARINDA MOHN, female    DOB: [birth year], 79 y.o.   MRN: [id number]  HPI She complains of a several day history of difficulty with seeing blood in her urine, dysuria and some lower abdominal pain but no fever, chills, nausea or vomiting. She also has noted episodes of dizziness that occasionally are related to moving from 1 position to another too quickly.  This usually goes away within a few seconds.  She is also had other dizziness while standing and notes that with change in head position, she can get dizzy.  No weakness, numbness, tingling, blurred or double vision.   Review of Systems     Objective:   Physical Exam Alert and in no distress.  Urine microscopic showed red and white cells.       Assessment & Plan:  Acute cystitis with hematuria - Plan: sulfamethoxazole-trimethoprim (BACTRIM DS) 800-160 MG tablet  Dizziness She is to return here in 2 weeks for repeat urinalysis to ensure she is cleared to hematuria. I then discussed the dizziness explaining that one is position related dizziness that goes away quickly when her body readjust with pulse and blood pressure.  The other dizziness is probably head position related.  She will keep track of this and if she has more difficulty, recommend she return here for reevaluation and possible Epley maneuvers.  She was comfortable with that.

## 2019-09-10 NOTE — Telephone Encounter (Signed)
Done KH 

## 2019-09-10 NOTE — Addendum Note (Signed)
Addended by: Elyse Jarvis on: 09/10/2019 04:26 PM   Modules accepted: Orders

## 2019-09-13 ENCOUNTER — Ambulatory Visit: Payer: Medicare Other | Attending: Internal Medicine

## 2019-09-13 DIAGNOSIS — Z23 Encounter for immunization: Secondary | ICD-10-CM

## 2019-09-13 NOTE — Progress Notes (Signed)
   Covid-19 Vaccination Clinic  Name:  Tonya Wise    MRN: [id number] DOB: [birth year]  09/13/2019  [name] was observed post Covid-19 immunization for 15 minutes without incidence. She was provided with Vaccine Information Sheet and instruction to access the V-Safe system.   [name] was instructed to call 911 with any severe reactions post vaccine: Marland Kitchen Difficulty breathing  . Swelling of your face and throat  . A fast heartbeat  . A bad rash all over your body  . Dizziness and weakness    Immunizations Administered    Name Date Dose VIS Date Route   Pfizer COVID-19 Vaccine 09/13/2019  8:44 AM 0.3 mL 07/10/2019 Intramuscular   Manufacturer: [place]   Lot: [id number]   [place]: [id number]

## 2019-09-22 ENCOUNTER — Other Ambulatory Visit (INDEPENDENT_AMBULATORY_CARE_PROVIDER_SITE_OTHER): Payer: Medicare Other

## 2019-09-22 ENCOUNTER — Telehealth: Payer: Self-pay

## 2019-09-22 DIAGNOSIS — N3001 Acute cystitis with hematuria: Secondary | ICD-10-CM

## 2019-09-22 LAB — POCT URINALYSIS DIP (PROADVANTAGE DEVICE)
Bilirubin, UA: NEGATIVE
Blood, UA: NEGATIVE
Glucose, UA: NEGATIVE mg/dL
Ketones, POC UA: NEGATIVE mg/dL
Leukocytes, UA: NEGATIVE
Nitrite, UA: NEGATIVE
Protein Ur, POC: NEGATIVE mg/dL
Specific Gravity, Urine: 1.015
Urobilinogen, Ur: 0.2
pH, UA: 6 (ref 5.0–8.0)

## 2019-09-22 NOTE — Telephone Encounter (Signed)
WNL

## 2019-09-22 NOTE — Telephone Encounter (Signed)
Pt came in for U/ A Repeat . Please advise after reviewing results in pt chart. [place]

## 2019-09-23 NOTE — Telephone Encounter (Signed)
Pt was advise KH 

## 2019-09-24 ENCOUNTER — Other Ambulatory Visit: Payer: Medicare Other

## 2019-10-06 ENCOUNTER — Ambulatory Visit: Payer: Medicare Other | Attending: Internal Medicine

## 2019-10-06 DIAGNOSIS — Z23 Encounter for immunization: Secondary | ICD-10-CM | POA: Insufficient documentation

## 2019-10-06 NOTE — Progress Notes (Signed)
   Covid-19 Vaccination Clinic  Name:  Tonya Wise    MRN: [id number] DOB: 03/15/1941  10/06/2019  Tonya Wise was observed post Covid-19 immunization for 15 minutes without incident. She was provided with Vaccine Information Sheet and instruction to access the V-Safe system.   Tonya Wise was instructed to call 911 with any severe reactions post vaccine: Marland Kitchen Difficulty breathing  . Swelling of face and throat  . A fast heartbeat  . A bad rash all over body  . Dizziness and weakness   Immunizations Administered    Name Date Dose VIS Date Route   Pfizer COVID-19 Vaccine 10/06/2019  8:15 AM 0.3 mL 07/10/2019 Intramuscular   Manufacturer: [place]   Lot: [id number]   [place]: [id number]

## 2019-12-07 ENCOUNTER — Ambulatory Visit (HOSPITAL_COMMUNITY)
Admission: RE | Admit: 2019-12-07 | Discharge: 2019-12-07 | Disposition: A | Discharge status: Home / Self Care | Payer: Medicare Other | Source: Ambulatory Visit | Attending: Internal Medicine | Admitting: Internal Medicine

## 2019-12-07 ENCOUNTER — Other Ambulatory Visit: Payer: Self-pay

## 2019-12-07 ENCOUNTER — Encounter (HOSPITAL_COMMUNITY): Payer: Self-pay

## 2019-12-07 ENCOUNTER — Inpatient Hospital Stay: Payer: Medicare Other | Attending: Internal Medicine

## 2019-12-07 DIAGNOSIS — I10 Essential (primary) hypertension: Secondary | ICD-10-CM | POA: Insufficient documentation

## 2019-12-07 DIAGNOSIS — M8580 Other specified disorders of bone density and structure, unspecified site: Secondary | ICD-10-CM | POA: Diagnosis not present

## 2019-12-07 DIAGNOSIS — C8590 Non-Hodgkin lymphoma, unspecified, unspecified site: Secondary | ICD-10-CM | POA: Diagnosis not present

## 2019-12-07 DIAGNOSIS — C8333 Diffuse large B-cell lymphoma, intra-abdominal lymph nodes: Secondary | ICD-10-CM | POA: Insufficient documentation

## 2019-12-07 DIAGNOSIS — Z79899 Other long term (current) drug therapy: Secondary | ICD-10-CM | POA: Insufficient documentation

## 2019-12-07 LAB — CMP (CANCER CENTER ONLY)
ALT: 9 U/L (ref 0–44)
AST: 15 U/L (ref 15–41)
Albumin: 3.6 g/dL (ref 3.5–5.0)
Alkaline Phosphatase: 52 U/L (ref 38–126)
Anion gap: 10 (ref 5–15)
BUN: 20 mg/dL (ref 8–23)
CO2: 28 mmol/L (ref 22–32)
Calcium: 9.2 mg/dL (ref 8.9–10.3)
Chloride: 104 mmol/L (ref 98–111)
Creatinine: 1.38 mg/dL — ABNORMAL HIGH (ref 0.44–1.00)
GFR, Est AFR Am: 42 mL/min — ABNORMAL LOW (ref >60)
GFR, Estimated: 37 mL/min — ABNORMAL LOW (ref >60)
Glucose, Bld: 100 mg/dL — ABNORMAL HIGH (ref 70–99)
Potassium: 3.7 mmol/L (ref 3.5–5.1)
Sodium: 142 mmol/L (ref 135–145)
Total Bilirubin: 0.5 mg/dL (ref 0.3–1.2)
Total Protein: 6 g/dL — ABNORMAL LOW (ref 6.5–8.1)

## 2019-12-07 LAB — CBC WITH DIFFERENTIAL (CANCER CENTER ONLY)
Abs Immature Granulocytes: 0.03 10*3/uL (ref 0.00–0.07)
Basophils Absolute: 0 10*3/uL (ref 0.0–0.1)
Basophils Relative: 1 %
Eosinophils Absolute: 0.1 10*3/uL (ref 0.0–0.5)
Eosinophils Relative: 2 %
HCT: 42.4 % (ref 36.0–46.0)
Hemoglobin: 13.7 g/dL (ref 12.0–15.0)
Immature Granulocytes: 1 %
Lymphocytes Relative: 31 %
Lymphs Abs: 1.4 10*3/uL (ref 0.7–4.0)
MCH: 28.6 pg (ref 26.0–34.0)
MCHC: 32.3 g/dL (ref 30.0–36.0)
MCV: 88.5 fL (ref 80.0–100.0)
Monocytes Absolute: 0.4 10*3/uL (ref 0.1–1.0)
Monocytes Relative: 9 %
Neutro Abs: 2.4 10*3/uL (ref 1.7–7.7)
Neutrophils Relative %: 56 %
Platelet Count: 127 10*3/uL — ABNORMAL LOW (ref 150–400)
RBC: 4.79 MIL/uL (ref 3.87–5.11)
RDW: 13.2 % (ref 11.5–15.5)
WBC Count: 4.3 10*3/uL (ref 4.0–10.5)
nRBC: 0 % (ref 0.0–0.2)

## 2019-12-07 LAB — LACTATE DEHYDROGENASE: LDH: 194 U/L — ABNORMAL HIGH (ref 98–192)

## 2019-12-07 MED ORDER — SODIUM CHLORIDE (PF) 0.9 % IJ SOLN
INTRAMUSCULAR | Status: AC
  Filled 2019-12-07: qty 50

## 2019-12-07 MED ORDER — IOHEXOL 300 MG/ML  SOLN
75.0000 mL | Freq: Once | INTRAMUSCULAR | Status: AC | PRN
  Administered 2019-12-07: 11:00:00 75 mL via INTRAVENOUS

## 2019-12-09 ENCOUNTER — Encounter: Payer: Self-pay | Admitting: Internal Medicine

## 2019-12-09 ENCOUNTER — Inpatient Hospital Stay (HOSPITAL_BASED_OUTPATIENT_CLINIC_OR_DEPARTMENT_OTHER): Payer: Medicare Other | Admitting: Internal Medicine

## 2019-12-09 ENCOUNTER — Other Ambulatory Visit: Payer: Self-pay

## 2019-12-09 VITALS — BP 160/73 | HR 62 | Temp 98.0°F | Resp 17 | Ht 62.0 in | Wt 147.0 lb

## 2019-12-09 DIAGNOSIS — Z8572 Personal history of non-Hodgkin lymphomas: Secondary | ICD-10-CM | POA: Diagnosis not present

## 2019-12-09 DIAGNOSIS — Z79899 Other long term (current) drug therapy: Secondary | ICD-10-CM | POA: Diagnosis not present

## 2019-12-09 DIAGNOSIS — D696 Thrombocytopenia, unspecified: Secondary | ICD-10-CM

## 2019-12-09 DIAGNOSIS — M8580 Other specified disorders of bone density and structure, unspecified site: Secondary | ICD-10-CM | POA: Diagnosis not present

## 2019-12-09 DIAGNOSIS — C8590 Non-Hodgkin lymphoma, unspecified, unspecified site: Secondary | ICD-10-CM | POA: Diagnosis not present

## 2019-12-09 DIAGNOSIS — I10 Essential (primary) hypertension: Secondary | ICD-10-CM | POA: Diagnosis not present

## 2019-12-09 NOTE — Progress Notes (Signed)
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  PRIOR THERAPY: Oncology History  Non-Hodgkins lymphoma [facility]) (Resolved)  03/27/2013 - 03/31/2013 Hospital Admission   Admitted to [facility] for profound anemia (Hgb 6.4).  C/o fevers, drenching nightsweats, weight lost.  Negative colonoscopy and EGD.  CT of C/A/P demonstrated significant lymphadenopathy suspcious of ympha.     03/31/2013 Initial Diagnosis   Biopsy of R inguinal lymph node revealed Non-Hodgkins lymphoma. Diffuse Large B-cell Lymphoma   04/10/2013 - 04/24/2013 Hospital Admission   Hypercalcemia, renal failure and dehydration.  Started min-RCHOP.    04/15/2013 Bone Marrow Biopsy   Involvement with Diffuse Large B-cell Lymphoma. Stage IV NHL w B-symptoms.    04/18/2013 - 04/18/2013 Chemotherapy   Mini R-CHOP Cycle #1 on 04/18/2013. Dose reduced to poor nutrion. It consisted on Cytoxan 400 mg/m2,Doxorubicin 25 mg/m2, Rituximab 375 mg/m2, Vincristine 72m.  Prednisone 100 mg daily for 9/13 - 9/16.   Received neupogen on 9/21, 9/27 -9/30.     04/25/2013 - 04/29/2013 Hospital Admission   Admitted due to worsening mouth sores/oral thrush, odynophagia, failure to thrive.  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  [facility] CGwynneth Munson and discharged to home on 05/19/2013.     06/01/2013 - 06/01/2013 Chemotherapy   Mini R-CHO #2 dosed as above.  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  Received neulasta shot on 06/02/13   06/16/2013 Cancer Staging   Re-staging PET/CT demonstrated significant interval reduction in axillary lymphadenopathy.  There was 1 small residual right axillary lymph node measuring 16 x 12 mm which showed FDG uptake.  Signifcant  reduction in mediastinal and hilar lymphadenopathy.    06/16/2013 Imaging   CT of chest noted left-sided pulmonary embolim. Started on lovenox to coumadin bridge.     06/22/2013 - 06/22/2013 Chemotherapy   Mini-R-CHO #3.  Received Neulasta on 06/23/2013.     07/16/2013 Imaging   2-D Echocardiogram demonstrates 35-45% EF with Grade 2 diastolic dysfunction.  Doxorubicin discontinued with consultation by cardiology (Dr. KClaiborne Billings.     08/19/2013 - 08/21/2013 Chemotherapy   Mini-R-CEO #4.  Etoposide 25 mg/m2 instead of doxo due to above.  Etoposide 519mbid on day 2/3.  On day 3 (08/21/2013) she received 1 of two oral doses of ectoposide.    08/21/2013 - 08/26/2013 Hospital Admission   [facility] and had influenzae A and pneumonia. Completed tamiflu and oral antibiotics.    09/25/2013 - 09/27/2013 Chemotherapy   Mini-R-CEO #5. Etoposide 25 mg/m2 25 mg bid on days 2/3.    10/16/2013 - 10/19/2013 Chemotherapy   Mini-R-CEO # 6. Etoposide 25 mg/m2 bid on days 2/3. Neulasta on day#4.    11/05/2013 Imaging   Interval resolution of the hypermetabolism identified within the enlarged right axillary lymph node seen previously. This lymph node has also decreased markedly in size in the interval. No new hypermetabolic lesions in the neck, chest, abdomen, or  pelvis.    11/19/2013 Bone Marrow Biopsy   There is no evidence of a B-cell lymphoproliferative process in this material. Normal cytogenetics.    04/16/2014 Imaging   CT scan Chest, abdomen: No evidence of recurrent lymphoma within the chest or abdomen.   Significant decrease in splenomegaly since prior exam.  CURRENT THERAPY: Observation.  INTERVAL HISTORY: Tonya Wise 79 y.o. female returns to the clinic today for annual follow-up visit.  The patient is feeling fine today with no concerning complaints.  She denied having any recent chest pain, shortness of breath, cough or hemoptysis.  She denied having  any weight loss or night sweats.  She has no nausea, vomiting, diarrhea or constipation.  She has no headache or visual changes.  She is a little bit anxious today and her blood pressure is elevated.  The patient had repeat CT scan of the chest, abdomen pelvis performed recently and she is here for evaluation and discussion of her risk her results.   MEDICAL HISTORY: Past Medical History:  Diagnosis Date  . Allergy   . Anemia   . Ankle fracture, left   . Chemotherapy induced cardiomyopathy ([place])    a. 08/2014: echo showing EF of 35-40% with Grade 2 DD and mild MR  . Diastolic dysfunction, grade I 04/12/2013  . GERD (gastroesophageal reflux disease)    Tums or Maalox  . Heart murmur   . Hypercalcemia 03/27/2013  . Hypertension   . Non-Hodgkins lymphoma ([place]) 04/06/2013  . Osteopenia   . Renal insufficiency     ALLERGIES:  is allergic to prednisone and penicillins.  MEDICATIONS:  Current Outpatient Medications  Medication Sig Dispense Refill  . alendronate (FOSAMAX) 70 MG tablet TAKE 1 TABLET BY MOUTH EVERY 7 DAYS. TAKE WITH A FULL GLASS OF WATER ON AN EMPTY STOMACH 12 tablet 0  . aspirin EC 81 MG tablet Take 1 tablet (81 mg total) by mouth 2 (two) times daily. (Patient taking differently: Take 81 mg by mouth daily. ) 90 tablet 0  . cholecalciferol (VITAMIN D) 1000 units tablet Take 1,000 Units by mouth daily.    . clarithromycin (BIAXIN) 500 MG tablet Take 1 tablet (500 mg total) by mouth 2 (two) times daily. (Patient not taking: Reported on 01/14/2019) 20 tablet 0  . cyanocobalamin 500 MCG tablet Take 500 mcg by mouth daily.     Marland Kitchen ibuprofen (ADVIL,MOTRIN) 200 MG tablet Take 400 mg by mouth every 6 (six) hours as needed.    Marland Kitchen losartan-hydrochlorothiazide (HYZAAR) 100-12.5 MG tablet Take 1 tablet by mouth daily. 90 tablet 3  . metoprolol tartrate (LOPRESSOR) 100 MG tablet Take 1 tablet (100 mg total) by mouth 2 (two) times daily. 180 tablet 3  . pantoprazole (PROTONIX) 40 MG tablet Take  1 tablet (40 mg total) by mouth daily. (Patient not taking: Reported on 08/26/2019) 30 tablet 0  . Phenazopyridine HCl 99.5 MG TABS Take 1 tablet by mouth 3 (three) times daily.    Marland Kitchen sulfamethoxazole-trimethoprim (BACTRIM DS) 800-160 MG tablet Take 1 tablet by mouth 2 (two) times daily. 20 tablet 0   No current facility-administered medications for this visit.    SURGICAL HISTORY:  Past Surgical History:  Procedure Laterality Date  . ABDOMINAL HYSTERECTOMY    . APPENDECTOMY    . BACK SURGERY     LOWER BACK TWICE  . COLONOSCOPY  2008  . ESOPHAGOGASTRODUODENOSCOPY N/A 03/29/2013   Procedure: ESOPHAGOGASTRODUODENOSCOPY (EGD);  Surgeon: Juanita Craver, MD;  [facility] ENDOSCOPY;  Service: Endoscopy;  Laterality: N/A;  . INGUINAL LYMPH NODE BIOPSY Right 03/31/2013   Procedure: INGUINAL LYMPH NODE BIOPSY;  Surgeon: Gwenyth Ober, MD;  Location: [facility];  Service: General;  Laterality: Right;  . ORIF ANKLE FRACTURE Left 11/03/2015   Procedure: OPEN REDUCTION INTERNAL FIXATION (ORIF) LEFT ANKLE FRACTURE;  Surgeon: Jenny Reichmann  Doran Durand, MD;  Location: [place];  Service: Orthopedics;  Laterality: Left;  . SPINE SURGERY      REVIEW OF SYSTEMS:  A comprehensive review of systems was negative.   PHYSICAL EXAMINATION: General appearance: alert, cooperative and no distress Head: Normocephalic, without obvious abnormality, atraumatic Neck: no adenopathy, no JVD, supple, symmetrical, trachea midline and thyroid not enlarged, symmetric, no tenderness/mass/nodules Lymph nodes: Cervical, supraclavicular, and axillary nodes normal. Resp: clear to auscultation bilaterally Back: symmetric, no curvature. ROM normal. No CVA tenderness. Cardio: regular rate and rhythm, S1, S2 normal, no murmur, click, rub or gallop GI: soft, non-tender; bowel sounds normal; no masses,  no organomegaly Extremities: extremities normal, atraumatic, no cyanosis or edema  ECOG PERFORMANCE STATUS: 1 - Symptomatic but completely  ambulatory  Blood pressure (!) 160/73, pulse 62, temperature 98 F (36.7 C), temperature source Temporal, resp. rate 17, height 5' 2" (1.575 m), weight 147 lb (66.7 kg), SpO2 98 %.  LABORATORY DATA: Lab Results  Component Value Date   WBC 4.3 12/07/2019   HGB 13.7 12/07/2019   HCT 42.4 12/07/2019   MCV 88.5 12/07/2019   PLT 127 (L) 12/07/2019      Chemistry      Component Value Date/Time   NA 142 12/07/2019 0852   NA 141 12/12/2016 0813   K 3.7 12/07/2019 0852   K 4.0 12/12/2016 0813   CL 104 12/07/2019 0852   CO2 28 12/07/2019 0852   CO2 26 12/12/2016 0813   BUN 20 12/07/2019 0852   BUN 20.3 12/12/2016 0813   CREATININE 1.38 (H) 12/07/2019 0852   CREATININE 1.46 (H) 08/05/2017 1639   CREATININE 1.3 (H) 12/12/2016 0813      Component Value Date/Time   CALCIUM 9.2 12/07/2019 0852   CALCIUM 8.9 12/12/2016 0813   ALKPHOS 52 12/07/2019 0852   ALKPHOS 55 12/12/2016 0813   AST 15 12/07/2019 0852   AST 13 12/12/2016 0813   ALT 9 12/07/2019 0852   ALT 10 12/12/2016 0813   BILITOT 0.5 12/07/2019 0852   BILITOT 0.55 12/12/2016 0813       RADIOGRAPHIC STUDIES: CT Chest W Contrast  Result Date: 12/07/2019 CLINICAL DATA:  Lymphoma follow-up my only version of [place] PICC in which is if I get a collapsed the next thing EXAM: CT CHEST, ABDOMEN, AND PELVIS WITH CONTRAST TECHNIQUE: Multidetector CT imaging of the chest, abdomen and pelvis was performed following the standard protocol during bolus administration of intravenous contrast. CONTRAST:  45m OMNIPAQUE IOHEXOL 300 MG/ML  SOLN COMPARISON:  12/05/2018 FINDINGS: CT CHEST FINDINGS Cardiovascular: Calcified thoracic aortic atherosclerosis. No aneurysm. Heart size is normal. Central pulmonary vasculature is of normal caliber. Mediastinum/Nodes: Thoracic inlet structures are normal. No axillary lymphadenopathy. No mediastinal lymphadenopathy. No hilar lymphadenopathy. Lungs/Pleura: Basilar atelectasis. No consolidation or evidence  of pleural effusion. Airways are patent. Musculoskeletal: See below for full musculoskeletal details. Bilateral breast implants are in place. CT ABDOMEN PELVIS FINDINGS Hepatobiliary: No focal, suspicious hepatic lesion. Gallbladder is normal. Biliary tree is nondilated. Pancreas: Pancreas is normal. No focal pancreatic lesion or ductal dilation. Spleen: Spleen is normal size without focal lesion. Adrenals/Urinary Tract: Adrenal glands are normal. Cortical scarring of the bilateral kidneys no hydronephrosis. Urinary bladder is unremarkable. Stomach/Bowel: No acute gastrointestinal process. The rectosigmoid colon is ahaustral perhaps related to prior colitis. Vascular/Lymphatic: Calcified atheromatous plaque of the abdominal aorta. No adenopathy. No pelvic lymphadenopathy. Reproductive: No adnexal masses. Other: No abdominal wall hernia or abnormality. No abdominopelvic ascites. Musculoskeletal: Signs of L3  through L5 spinal fusion with retrolisthesis of L2 on L3 which is similar to prior imaging. IMPRESSION: 1. No CT evidence of recurrent lymphoma within the chest, abdomen or pelvis. 2. The rectosigmoid colon is ahaustral perhaps related to prior colitis. No current evidence of pericolonic stranding. 3. Aortic atherosclerosis. 4. Signs of L3 through L5 spinal fusion with retrolisthesis of L2 on L3 which is similar to prior imaging. Aortic Atherosclerosis (ICD10-I70.0). Electronically Signed   By: Zetta Bills M.D.   On: 12/07/2019 14:18   CT Abdomen Pelvis W Contrast  Result Date: 12/07/2019 CLINICAL DATA:  Lymphoma follow-up my only version of [place] PICC in which is if I get a collapsed the next thing EXAM: CT CHEST, ABDOMEN, AND PELVIS WITH CONTRAST TECHNIQUE: Multidetector CT imaging of the chest, abdomen and pelvis was performed following the standard protocol during bolus administration of intravenous contrast. CONTRAST:  9m OMNIPAQUE IOHEXOL 300 MG/ML  SOLN COMPARISON:  12/05/2018 FINDINGS: CT CHEST  FINDINGS Cardiovascular: Calcified thoracic aortic atherosclerosis. No aneurysm. Heart size is normal. Central pulmonary vasculature is of normal caliber. Mediastinum/Nodes: Thoracic inlet structures are normal. No axillary lymphadenopathy. No mediastinal lymphadenopathy. No hilar lymphadenopathy. Lungs/Pleura: Basilar atelectasis. No consolidation or evidence of pleural effusion. Airways are patent. Musculoskeletal: See below for full musculoskeletal details. Bilateral breast implants are in place. CT ABDOMEN PELVIS FINDINGS Hepatobiliary: No focal, suspicious hepatic lesion. Gallbladder is normal. Biliary tree is nondilated. Pancreas: Pancreas is normal. No focal pancreatic lesion or ductal dilation. Spleen: Spleen is normal size without focal lesion. Adrenals/Urinary Tract: Adrenal glands are normal. Cortical scarring of the bilateral kidneys no hydronephrosis. Urinary bladder is unremarkable. Stomach/Bowel: No acute gastrointestinal process. The rectosigmoid colon is ahaustral perhaps related to prior colitis. Vascular/Lymphatic: Calcified atheromatous plaque of the abdominal aorta. No adenopathy. No pelvic lymphadenopathy. Reproductive: No adnexal masses. Other: No abdominal wall hernia or abnormality. No abdominopelvic ascites. Musculoskeletal: Signs of L3 through L5 spinal fusion with retrolisthesis of L2 on L3 which is similar to prior imaging. IMPRESSION: 1. No CT evidence of recurrent lymphoma within the chest, abdomen or pelvis. 2. The rectosigmoid colon is ahaustral perhaps related to prior colitis. No current evidence of pericolonic stranding. 3. Aortic atherosclerosis. 4. Signs of L3 through L5 spinal fusion with retrolisthesis of L2 on L3 which is similar to prior imaging. Aortic Atherosclerosis (ICD10-I70.0). Electronically Signed   By: GZetta BillsM.D.   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 The patient has been on observation since that time. She had repeat CT scan of the chest, abdomen pelvis performed recently.  I personally and independently reviewed the scans and discussed the results with the patient today. Her scan showed no concerning findings for disease recurrence or metastasis. I recommended for her to continue on observation with repeat CT scan of the chest, abdomen pelvis in 1 year. For the hypertension, I strongly advised the patient to take her blood pressure medication as prescribed and to monitor it closely at home. She was advised to call immediately if she has any other concerning symptoms in the interval. The patient voices understanding of current disease status and treatment options and is in agreement with the current care plan. All questions were answered. The patient knows to call the clinic with any problems, questions or  concerns. We can certainly see the patient much sooner if necessary.   Disclaimer: This note was dictated with voice recognition software. Similar sounding words can inadvertently be transcribed and may not be corrected upon review.

## 2019-12-10 ENCOUNTER — Telehealth: Payer: Self-pay | Admitting: Internal Medicine

## 2019-12-10 NOTE — Telephone Encounter (Signed)
Scheduled per los. Called and left msg. Mailed printout  °

## 2019-12-17 ENCOUNTER — Ambulatory Visit: Payer: Medicare Other | Admitting: Cardiovascular Disease

## 2019-12-17 ENCOUNTER — Encounter: Payer: Self-pay | Admitting: Family Medicine

## 2019-12-17 ENCOUNTER — Ambulatory Visit
Admission: RE | Admit: 2019-12-17 | Discharge: 2019-12-17 | Disposition: A | Discharge status: Home / Self Care | Payer: Medicare Other | Source: Ambulatory Visit | Attending: Family Medicine | Admitting: Family Medicine

## 2019-12-17 ENCOUNTER — Other Ambulatory Visit: Payer: Self-pay

## 2019-12-17 ENCOUNTER — Ambulatory Visit (INDEPENDENT_AMBULATORY_CARE_PROVIDER_SITE_OTHER): Payer: Medicare Other | Admitting: Family Medicine

## 2019-12-17 VITALS — BP 166/80 | HR 60 | Temp 97.8°F | Wt 145.0 lb

## 2019-12-17 DIAGNOSIS — M542 Cervicalgia: Secondary | ICD-10-CM | POA: Diagnosis not present

## 2019-12-17 NOTE — Progress Notes (Signed)
   Subjective:    Patient ID: Tonya Wise, female    DOB: 29-Oct-1940, 79 y.o.   MRN: [id number]  HPI She complains of a several month history of right-sided neck and upper shoulder pain.  It is made worse with any motion.  She will occasionally get a tingling sensation in the anterior chest and occasionally down to her forearm.  No weakness, numbness or tingling down her arms.  No history of injury.  Review of Systems     Objective:   Physical Exam Pain on motion of the neck in any direction.  Spurling test was questionably positive.  Normal motor, sensory and DTRs of the arms.       Assessment & Plan:  Neck pain - Plan: DG Cervical Spine Complete Assuming the x-ray shows arthritic change take 800 mg of ibuprofen 3 times per day for the next 10 days

## 2019-12-17 NOTE — Patient Instructions (Signed)
Assuming the x-ray shows arthritic change take 800 mg of ibuprofen 3 times per day for the next 10 days

## 2019-12-23 DIAGNOSIS — R1033 Periumbilical pain: Secondary | ICD-10-CM | POA: Diagnosis not present

## 2019-12-23 DIAGNOSIS — R197 Diarrhea, unspecified: Secondary | ICD-10-CM | POA: Diagnosis not present

## 2019-12-23 DIAGNOSIS — I10 Essential (primary) hypertension: Secondary | ICD-10-CM | POA: Diagnosis not present

## 2019-12-30 ENCOUNTER — Ambulatory Visit: Payer: Medicare Other | Admitting: Cardiovascular Disease

## 2020-02-03 ENCOUNTER — Ambulatory Visit: Payer: Medicare Other | Admitting: Medical

## 2020-02-03 ENCOUNTER — Other Ambulatory Visit: Payer: Self-pay | Admitting: Family Medicine

## 2020-02-03 DIAGNOSIS — M810 Age-related osteoporosis without current pathological fracture: Secondary | ICD-10-CM

## 2020-02-11 ENCOUNTER — Other Ambulatory Visit: Payer: Self-pay | Admitting: Family Medicine

## 2020-02-11 DIAGNOSIS — I10 Essential (primary) hypertension: Secondary | ICD-10-CM

## 2020-02-11 NOTE — Telephone Encounter (Signed)
Pt called to check on refill and to request refill be for 90 days.

## 2020-02-12 ENCOUNTER — Other Ambulatory Visit: Payer: Self-pay | Admitting: Family Medicine

## 2020-02-12 DIAGNOSIS — I10 Essential (primary) hypertension: Secondary | ICD-10-CM

## 2020-02-16 DIAGNOSIS — H35033 Hypertensive retinopathy, bilateral: Secondary | ICD-10-CM | POA: Diagnosis not present

## 2020-02-16 DIAGNOSIS — H35372 Puckering of macula, left eye: Secondary | ICD-10-CM | POA: Diagnosis not present

## 2020-02-16 DIAGNOSIS — H04123 Dry eye syndrome of bilateral lacrimal glands: Secondary | ICD-10-CM | POA: Diagnosis not present

## 2020-02-16 DIAGNOSIS — Z961 Presence of intraocular lens: Secondary | ICD-10-CM | POA: Diagnosis not present

## 2020-02-16 LAB — HM DIABETES EYE EXAM

## 2020-02-26 ENCOUNTER — Encounter: Payer: Self-pay | Admitting: Family Medicine

## 2020-03-09 ENCOUNTER — Other Ambulatory Visit: Payer: Self-pay

## 2020-03-09 ENCOUNTER — Ambulatory Visit (INDEPENDENT_AMBULATORY_CARE_PROVIDER_SITE_OTHER): Payer: Medicare Other | Admitting: Family Medicine

## 2020-03-09 ENCOUNTER — Encounter: Payer: Self-pay | Admitting: Family Medicine

## 2020-03-09 VITALS — BP 162/82 | HR 55 | Temp 97.1°F | Wt 144.8 lb

## 2020-03-09 DIAGNOSIS — E7439 Other disorders of intestinal carbohydrate absorption: Secondary | ICD-10-CM | POA: Insufficient documentation

## 2020-03-09 DIAGNOSIS — Z1159 Encounter for screening for other viral diseases: Secondary | ICD-10-CM | POA: Diagnosis not present

## 2020-03-09 DIAGNOSIS — K219 Gastro-esophageal reflux disease without esophagitis: Secondary | ICD-10-CM | POA: Diagnosis not present

## 2020-03-09 DIAGNOSIS — Z8572 Personal history of non-Hodgkin lymphomas: Secondary | ICD-10-CM

## 2020-03-09 DIAGNOSIS — M810 Age-related osteoporosis without current pathological fracture: Secondary | ICD-10-CM

## 2020-03-09 DIAGNOSIS — I10 Essential (primary) hypertension: Secondary | ICD-10-CM | POA: Diagnosis not present

## 2020-03-09 DIAGNOSIS — I427 Cardiomyopathy due to drug and external agent: Secondary | ICD-10-CM | POA: Diagnosis not present

## 2020-03-09 DIAGNOSIS — H35039 Hypertensive retinopathy, unspecified eye: Secondary | ICD-10-CM | POA: Diagnosis not present

## 2020-03-09 DIAGNOSIS — I5042 Chronic combined systolic (congestive) and diastolic (congestive) heart failure: Secondary | ICD-10-CM | POA: Diagnosis not present

## 2020-03-09 DIAGNOSIS — N1832 Chronic kidney disease, stage 3b: Secondary | ICD-10-CM | POA: Insufficient documentation

## 2020-03-09 DIAGNOSIS — D696 Thrombocytopenia, unspecified: Secondary | ICD-10-CM

## 2020-03-09 DIAGNOSIS — T451X5A Adverse effect of antineoplastic and immunosuppressive drugs, initial encounter: Secondary | ICD-10-CM

## 2020-03-09 DIAGNOSIS — E785 Hyperlipidemia, unspecified: Secondary | ICD-10-CM | POA: Diagnosis not present

## 2020-03-09 NOTE — Progress Notes (Signed)
Tonya Wise is a 79 y.o. female who presents for annual wellness visit and follow-up on chronic medical conditions.  She has no particular concerns or complaints.  She is scheduled for follow-up concerning her cardiomyopathy and CHF with Dr. Claiborne Billings in the near future.  No chest pain, shortness of breath, PND or DOE she continues on Fosamax and having no difficulty with that.  She is also taking metoprolol as well as losartan/HCTZ without difficulty.  She does have reflux symptoms but rarely takes medication for that.  She has been seen by ophthalmology and does have hypertensive retinopathy.  She does have a previous history of lymphoma and does follow-up periodically with oncology.  Review of the record indicates decreasing renal function.  She also has evidence of thrombocytopenia.  Recent blood work was reviewed.  Immunizations and Health Maintenance Immunization History  Administered Date(s) Administered  . Fluad Quad(high Dose 65+) 04/09/2019  . Influenza Split 04/12/2011, 04/25/2012  . Influenza, High Dose Seasonal PF 04/21/2014, 04/07/2015, 04/24/2016, 04/22/2017, 04/17/2018  . Influenza,inj,Quad PF,6+ Mos 04/11/2013  . PFIZER SARS-COV-2 Vaccination 09/13/2019, 10/06/2019  . Pneumococcal Conjugate-13 12/27/2016  . Pneumococcal Polysaccharide-23 04/15/2006, 04/11/2013  . Tdap 07/17/2014  . Zoster 04/15/2006  . Zoster Recombinat (Shingrix) 09/05/2017, 11/23/2017   Health Maintenance Due  Topic Date Due  . Hepatitis C Screening  Never done  . INFLUENZA VACCINE  02/28/2020    Last Pap smear: aged out Last mammogram: aged out Last colonoscopy: 10/11/11 Last DEXA: 01/25/17 Dentist: Q six months Ophtho: Yearly Exercise: walking and yard work  Other doctors caring for patient include: Dr. [facility], Dr.[facility] Claiborne Billings  Advanced directives: Does Patient Have a Medical Advance Directive?: No Would patient like information on creating a medical advance directive?: No - Patient  declined  Depression screen:  See questionnaire below.  Depression screen [facility] 2/9 03/09/2020 01/14/2019 01/17/2018 12/27/2016 07/13/2014  Decreased Interest 0 0 0 0 0  Down, Depressed, Hopeless 0 0 0 0 0  PHQ - 2 Score 0 0 0 0 0  Some recent data might be hidden    Fall Risk Screen: see questionnaire below. Fall Risk  03/09/2020 01/14/2019 01/17/2018 12/27/2016 07/13/2014  Falls in the past year? 0 0 No No No  Risk for fall due to : No Fall Risks - - - -    ADL screen:  See questionnaire below Functional Status Survey: Is the patient deaf or have difficulty hearing?: No Does the patient have difficulty seeing, even when wearing glasses/contacts?: No Does the patient have difficulty concentrating, remembering, or making decisions?: No Does the patient have difficulty walking or climbing stairs?: No Does the patient have difficulty dressing or bathing?: No Does the patient have difficulty doing errands alone such as visiting a doctor's office or shopping?: No   Review of Systems Constitutional: -, -unexpected weight change, -anorexia, -fatigue Allergy: -sneezing, -itching, -congestion Dermatology: denies changing moles, rash, lumps ENT: -runny nose, -ear pain, -sore throat,  Cardiology:  -chest pain, -palpitations, -orthopnea, Respiratory: -cough, -shortness of breath, -dyspnea on exertion, -wheezing,  Gastroenterology: -abdominal pain, -nausea, -vomiting, -diarrhea, -constipation, -dysphagia Hematology: -bleeding or bruising problems Musculoskeletal: -arthralgias, -myalgias, -joint swelling, -back pain, - Ophthalmology: -vision changes,  Urology: -dysuria, -difficulty urinating,  -urinary frequency, -urgency, incontinence Neurology: -, -numbness, , -memory loss, -falls, -dizziness    PHYSICAL EXAM:  BP (!) 162/82   Pulse (!) 55   Temp (!) 97.1 F (36.2 C)   Wt 144 lb 12.8 oz (65.7 kg)   SpO2 96%  BMI 26.48 kg/m   General Appearance: Alert, cooperative, no distress,  appears stated age Head: Normocephalic, without obvious abnormality, atraumatic Eyes: PERRL, conjunctiva/corneas clear, EOM's intact, Ears: Normal TM's and external ear canals Nose: Nares normal, mucosa normal, no drainage or sinus tenderness Throat: Lips, mucosa, and tongue normal; teeth and gums normal Neck: Supple, no lymphadenopathy;  thyroid:  no enlargement/tenderness/nodules; no carotid bruit or JVD Lungs: Clear to auscultation bilaterally without wheezes, rales or ronchi; respirations unlabored Heart: Regular rate and rhythm, S1 and S2 normal, no murmur, rubor gallop Abdomen: Soft, non-tender, nondistended, normoactive bowel sounds,  no masses, no hepatosplenomegaly Extremities: No clubbing, cyanosis or edema Pulses: 2+ and symmetric all extremities Skin:  Skin color, texture, turgor normal, no rashes or lesions Lymph nodes: Cervical, supraclavicular, and axillary nodes normal Neurologic:  CNII-XII intact, normal strength, sensation and gait; reflexes 2+ and symmetric throughout Psych: Normal mood, affect, hygiene and grooming.  ASSESSMENT/PLAN: Essential hypertension  History of B-cell lymphoma  Chemotherapy induced cardiomyopathy (HCC)  Hyperlipidemia, unspecified hyperlipidemia type  Chronic combined systolic and diastolic heart failure (HCC)  Gastroesophageal reflux disease, unspecified whether esophagitis present  Stage 3b chronic kidney disease  Hypertensive retinopathy, unspecified laterality  Age-related osteoporosis without current pathological fracture  Encounter for hepatitis C screening test for low risk patient - Plan: Hepatitis C antibody  Thrombocytopenia ([place]) She will continue on her present medication regimen.  She will follow up with the various specialists.  I discussed the chronic kidney disease with her explaining at this point its monitoring as well as monitoring his platelets.  No therapy needed for the reflux as she uses the medicine on an  as-needed basis.  Follow-up with ophthalmology as needed. proper sunscreen use reviewed; healthy diet, including goals of calcium and vitamin D intake  Immunization recommendations discussed.  Colonoscopy recommendations reviewed   Medicare Attestation I have personally reviewed: The patient's medical and social history Their use of alcohol, tobacco or illicit drugs Their current medications and supplements The patient's functional ability including ADLs,fall risks, home safety risks, cognitive, and hearing and visual impairment Diet and physical activities Evidence for depression or mood disorders  The patient's weight, height, and BMI have been recorded in the chart.  I have made referrals, counseling, and provided education to the patient based on review of the above and I have provided the patient with a written personalized care plan for preventive services.     Jill Alexanders, MD   03/09/2020

## 2020-03-10 LAB — HEPATITIS C ANTIBODY: Hep C Virus Ab: <0.1 s/co ratio (ref 0.0–0.9)

## 2020-03-30 ENCOUNTER — Ambulatory Visit (INDEPENDENT_AMBULATORY_CARE_PROVIDER_SITE_OTHER): Payer: Medicare Other | Admitting: Cardiovascular Disease

## 2020-03-30 ENCOUNTER — Other Ambulatory Visit: Payer: Self-pay

## 2020-03-30 ENCOUNTER — Encounter: Payer: Self-pay | Admitting: Cardiovascular Disease

## 2020-03-30 VITALS — BP 148/72 | HR 58 | Ht 63.0 in | Wt 140.8 lb

## 2020-03-30 DIAGNOSIS — T451X5A Adverse effect of antineoplastic and immunosuppressive drugs, initial encounter: Secondary | ICD-10-CM | POA: Diagnosis not present

## 2020-03-30 DIAGNOSIS — M810 Age-related osteoporosis without current pathological fracture: Secondary | ICD-10-CM

## 2020-03-30 DIAGNOSIS — I5042 Chronic combined systolic (congestive) and diastolic (congestive) heart failure: Secondary | ICD-10-CM

## 2020-03-30 DIAGNOSIS — K219 Gastro-esophageal reflux disease without esophagitis: Secondary | ICD-10-CM

## 2020-03-30 DIAGNOSIS — I427 Cardiomyopathy due to drug and external agent: Secondary | ICD-10-CM

## 2020-03-30 DIAGNOSIS — I10 Essential (primary) hypertension: Secondary | ICD-10-CM | POA: Diagnosis not present

## 2020-03-30 MED ORDER — METOPROLOL SUCCINATE ER 100 MG PO TB24
100.0000 mg | ORAL_TABLET | Freq: Every day | ORAL | 3 refills | Status: DC
Start: 2020-03-30 — End: 2021-03-28

## 2020-03-30 NOTE — Progress Notes (Signed)
Cardiology Office Note    Date:  04/04/2020   ID:  Tonya Wise January 26, 1941, MRN [id number]  PCP:  Denita Lung, MD  Cardiologist:  Shelva Majestic, MD   Follow-up evaluation, last seen by me Dec 20, 2014.  History of Present Illness:  Tonya Wise is a 79 y.o. female who was diagnosed as having non-Hodgkin's lymphoma in 2014. She received 2 treatments of chemotherapy. On 06/16/2013 she underwent a followup CT scan which showed slight interval reduction in axillary lymphadenopathy but there was a 1 small residual right axillary lymph node measuring 16 a 12 mm. She also is noted a significant reduction in mediastinal and hilar adenopathy. However, it was noted that she had a left-sided pulmonary embolism. There also was resolution of bulky abdominal lymphadenopathy as well as interval decrease in the size of her spleen since initiating chemotherapy. Because of the diagnosis of left-sided pulmonary embolism she apparently started therapy with combination Lovenox as well as oral Coumadin. She apparently was seen in Dr. Lanice Shirts office on 06/17/2013 with complaints of increasing cough that has become productive. She was started on Zithromax for possible acute bronchitis. She was tachycardic and did have T-wave changes.   A 2-D echo Doppler study in September 2014 while she was hospitalized at [facility] demonstrated an ejection fraction was 50-55%. She had pulmonary hypertension with estimated RV systolic pressure at 39 mm and Grade 1 diastolic dysfunction.  When I initially saw her in November 2014, I  recommend that she undergo a nuclear perfusion study because of some  chest discomfort. Her nuclear perfusion study demonstrated normal perfusion, however ejection fraction was interpreted at 42%. Subsequently, a followup echo Doppler study was recommended and also discuss this with her oncologist, Dr. Juliann Mule. Her followup echo Doppler study on 07/16/2013 confirmed LV dysfunction and  ejection fraction was now 35-40%. There was diffuse hypokinesis. She now had grade 2 diastolic dysfunction. Due to her reduction of LV function, subsequent chemotherapyhas been at a reduced dose. She completed cycle 5 of mini-R-CEO. She does have thrombocytopenia and when last seen by Dr. Juliann Mule her platelets were 59,000. She is scheduled to undergo an additional week of recovery prior to undergoing a next course of chemotherapy.  She completed her last chemotherapy dose in March 2015.  Recently, she has had issues with her blood pressure being elevated.  She states her platelet count has improved and most recently this has been 122,000.  I saw her 3 months ago, her ECG revealed sinus bradycardia on her dose of metoprolol, tartrate at 37.5 twice a day, and I did not further increase this; however, with her previous diastolic dysfunction.  Iurther titrated her losartan to 100 mg daily.  She has been taking this 50 mg twice a day.  I recommended that she have a follow-up echo Doppler study which was done on 09/21/2014 and demonstrated an ejection fraction in the range of 35-40% with previously noted diffuse hypokinesis.  She again had a  pseudonormal left ventricular filling pattern consistent with grade 2 diastolic dysfunction.  There was mild mitral regurgitation.  She states when she was recently seen at the cancer center.  Her blood pressure was elevated.  However, she was anxious that they said she was having a follow-up CT scan.  I have not seen her since May 2016.  She was evaluated by Bernerd Pho, PA-C in June 2018.  At that time she continued to feel well but did experience occasional fatigue but  remained active.  In September 2019 she was evaluated by Fabian Sharp and prior to that evaluation was having some difficulty with blood pressure control.  During that evaluation she was doing well on an increased dose of Lopressor 100 mg twice a day and losartan HCT 100/12.5 mg.  She has continued to  be followed by Dr. Earlie Server for her diffuse large B cell non-Hodgkin's lymphoma.  She is felt to be stable.  She recently was seen by Dr. Jill Alexanders who is her primary MD.  During his evaluation she was hypertensive and there was concern of some development of renal insufficiency.  Laboratory in May 2021 showed mild thrombocytopenia with a platelet count of 127.  Creatinine was 1.38 consistent with stage IIIb CKD.  Presently she remains active doing gardening and yard work as well as housework.  She denies chest pain.  She does note some mild shortness of breath particularly when walking uphill.  She has not had any recent evaluation of LV function.  She presents for evaluation.  Past Medical History:  Diagnosis Date  . Allergy   . Anemia   . Ankle fracture, left   . Chemotherapy induced cardiomyopathy ([place])    a. 08/2014: echo showing EF of 35-40% with Grade 2 DD and mild MR  . Diastolic dysfunction, grade I 04/12/2013  . GERD (gastroesophageal reflux disease)    Tums or Maalox  . Heart murmur   . Hypercalcemia 03/27/2013  . Hypertension   . Non-Hodgkins lymphoma ([place]) 04/06/2013  . Osteopenia   . Renal insufficiency     Past Surgical History:  Procedure Laterality Date  . ABDOMINAL HYSTERECTOMY    . APPENDECTOMY    . BACK SURGERY     LOWER BACK TWICE  . COLONOSCOPY  2008  . ESOPHAGOGASTRODUODENOSCOPY N/A 03/29/2013   Procedure: ESOPHAGOGASTRODUODENOSCOPY (EGD);  Surgeon: Juanita Craver, MD;  [facility];  Service: Endoscopy;  Laterality: N/A;  . INGUINAL LYMPH NODE BIOPSY Right 03/31/2013   Procedure: INGUINAL LYMPH NODE BIOPSY;  Surgeon: Gwenyth Ober, MD;  Location: [place];  Service: General;  Laterality: Right;  . ORIF ANKLE FRACTURE Left 11/03/2015   Procedure: OPEN REDUCTION INTERNAL FIXATION (ORIF) LEFT ANKLE FRACTURE;  Surgeon: Wylene Simmer, MD;  Location: [facility];  Service: Orthopedics;  Laterality: Left;  . SPINE SURGERY      Current  Medications: Outpatient Medications Prior to Visit  Medication Sig Dispense Refill  . alendronate (FOSAMAX) 70 MG tablet TAKE 1 TABLET BY MOUTH EVERY 7 DAYS. TAKE WITH A FULL GLASS OF WATER ON AN EMPTY STOMACH 12 tablet 0  . aspirin EC 81 MG tablet Take 1 tablet (81 mg total) by mouth 2 (two) times daily. (Patient taking differently: Take 81 mg by mouth daily. ) 90 tablet 0  . cholecalciferol (VITAMIN D) 1000 units tablet Take 1,000 Units by mouth daily.    . clarithromycin (BIAXIN) 500 MG tablet Take 1 tablet (500 mg total) by mouth 2 (two) times daily. 20 tablet 0  . cyanocobalamin 500 MCG tablet Take 500 mcg by mouth daily.     Marland Kitchen ibuprofen (ADVIL,MOTRIN) 200 MG tablet Take 400 mg by mouth every 6 (six) hours as needed.    Marland Kitchen losartan-hydrochlorothiazide (HYZAAR) 100-12.5 MG tablet TAKE 1 TABLET BY MOUTH EVERY DAY 90 tablet 0  . pantoprazole (PROTONIX) 40 MG tablet Take 1 tablet (40 mg total) by mouth daily. 30 tablet 0  . RESTASIS 0.05 % ophthalmic emulsion     .  metoprolol tartrate (LOPRESSOR) 100 MG tablet TAKE 1 TABLET BY MOUTH TWICE A DAY 180 tablet 0   No facility-administered medications prior to visit.     Allergies:   Prednisone and Penicillins   Social History   Socioeconomic History  . Marital status: Married    Spouse name: [name]  . Number of children: 2  . Years of education: 28  . Highest education level: Not on file  Occupational History    Employer: [employer]  Tobacco Use  . Smoking status: Never Smoker  . Smokeless tobacco: Never Used  Substance and Sexual Activity  . Alcohol use: No  . Drug use: No  . Sexual activity: Yes    Partners: Male  Other Topics Concern  . Not on file  Social History Narrative   Married.  Lives in [place] with husband.     Social Determinants of Health   Financial Resource Strain:   . Difficulty of Paying Living Expenses: Not on file  Food Insecurity:   . Worried About Charity fundraiser in the Last Year: Not on file   . Ran Out of Food in the Last Year: Not on file  Transportation Needs:   . Lack of Transportation (Medical): Not on file  . Lack of Transportation (Non-Medical): Not on file  Physical Activity:   . Days of Exercise per Week: Not on file  . Minutes of Exercise per Session: Not on file  Stress:   . Feeling of Stress : Not on file  Social Connections:   . Frequency of Communication with Friends and Family: Not on file  . Frequency of Social Gatherings with Friends and Family: Not on file  . Attends Religious Services: Not on file  . Active Member of Clubs or Organizations: Not on file  . Attends Archivist Meetings: Not on file  . Marital Status: Not on file    Social history is notable in that she is married for 60 years. She completed 12 grade of education. She is retired. She has 2 children, 60, and 56 and 2 grandchildren, 16 and 45.. No tobacco use presently. She quit remotely in 1962. There is no alcohol use. She does not routinely exercise.  Family History:  The patient's family history includes Diabetes in her brother and sister; Heart attack (age of onset: 2) in her father; Leukemia in her mother.   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   PHYSICAL EXAM:   VS:  BP (!) 148/72   Pulse (!) 58   Ht '5\' 3"'  (1.6 m)   Wt 140 lb 12.8 oz (63.9 kg)   SpO2 98%   BMI 24.94 kg/m     Repeat blood pressure 145/70  Wt Readings from Last 3 Encounters:  03/30/20 140 lb 12.8 oz (63.9 kg)  03/09/20 144 lb 12.8 oz (65.7 kg)  12/17/19 145 lb (65.8 kg)    General: Alert, oriented, no distress.  Skin: normal turgor, no rashes, warm and dry HEENT: Normocephalic, atraumatic. Pupils equal round and reactive to light; sclera anicteric; extraocular muscles intact;  Nose without nasal septal hypertrophy Mouth/Parynx benign; Mallinpatti scale 2 Neck: No JVD, no carotid bruits; normal carotid upstroke Lungs: clear to ausculatation and percussion; no wheezing or rales Chest wall: without tenderness to palpitation Heart: PMI not displaced, RRR, s1 s2 normal, 1/6 systolic murmur, no diastolic murmur, no rubs, gallops, thrills, or heaves Abdomen: soft, nontender; no hepatosplenomehaly, BS+; abdominal aorta nontender and not dilated by palpation. Back: no CVA tenderness Pulses 2+ Musculoskeletal: full range of motion, normal strength, no joint deformities Extremities: no clubbing cyanosis or edema, Homan's sign negative  Neurologic: grossly nonfocal; Cranial nerves grossly wnl Psychologic: Normal mood and affect   Studies/Labs Reviewed:   EKG:  EKG is ordered today. ECG (independently read by me): Sinus bradycardia at 58; QS V1-2; no ectopy, normal intervals  Recent Labs: BMP Latest Ref Rng & Units 12/07/2019 12/05/2018 12/06/2017  Glucose 70 - 99 mg/dL 100(H) 109(H) 88  BUN 8 - 23 mg/dL '20 14 24  ' Creatinine 0.44 - 1.00 mg/dL 1.38(H) 1.30(H) 1.46(H)  BUN/Creat Ratio 6 - 22 (calc) - - -  Sodium 135 - 145 mmol/L 142 142 142  Potassium 3.5 - 5.1 mmol/L 3.7 4.0 4.2  Chloride 98 -  111 mmol/L 104 103 105  CO2 22 - 32 mmol/L '28 31 28  ' Calcium 8.9 - 10.3 mg/dL 9.2 9.6 10.1     Hepatic Function Latest Ref Rng & Units 12/07/2019 12/05/2018 12/06/2017  Total Protein 6.5 - 8.1 g/dL 6.0(L) 6.5 6.3(L)  Albumin 3.5 - 5.0 g/dL 3.6 4.0 4.0  AST 15 - 41 U/L 15 14(L) 16  ALT 0 - 44 U/L '9 10 13  ' Alk Phosphatase 38 - 126 U/L 52 53 50  Total Bilirubin 0.3 - 1.2 mg/dL 0.5 0.6 0.5    CBC Latest Ref Rng & Units 12/07/2019 12/05/2018 12/06/2017  WBC 4.0 - 10.5 K/uL 4.3 5.0 5.3  Hemoglobin 12.0 - 15.0 g/dL 13.7 15.3(H) 14.4  Hematocrit 36 - 46 % 42.4 47.5(H) 43.8  Platelets 150 - 400 K/uL 127(L) 130(L) 118(L)   Lab Results  Component Value Date   MCV 88.5 12/07/2019   MCV 87.2 12/05/2018   MCV 85.5 12/06/2017   Lab Results  Component Value Date   TSH 1.925 08/21/2013   Lab Results  Component Value Date   HGBA1C 5.4 01/14/2019     BNP    Component Value Date/Time   BNP 547.6 (H) 06/17/2013 1704    ProBNP    Component Value Date/Time   PROBNP 21,795.0 (H) 08/21/2013 1905     Lipid Panel     Component Value Date/Time   CHOL 190 12/27/2016 1029   TRIG 138 12/27/2016 1029   HDL 56 12/27/2016 1029   CHOLHDL 3.4 12/27/2016 1029   VLDL 28 12/27/2016 1029   LDLCALC 106 (H) 12/27/2016 1029     RADIOLOGY: No results found.   Additional studies/ records that were reviewed today include:  I reviewed the patient's record since my last evaluation over 5 years ago  ASSESSMENT:    1. Essential hypertension   2. Chemotherapy induced cardiomyopathy ([place])   3. Chronic combined systolic and diastolic heart failure (HCC)   4. Age-related osteoporosis without current pathological fracture   5. Gastroesophageal reflux  disease without esophagitis     PLAN:  Tonya Wise is a very pleasant 79 year old young appearing female who has a history of non-Hodgkin's lymphoma initially diagnosed in September 2014.  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  Her lymphoma fortunately has stabilized and on her most recent CT in May 2021 there was no evidence for recurrent lymphoma within the chest, abdomen or pelvis.  She has had issues with hypertension.  A prior echo Doppler study in February 2016 showed an EF of 35 to 40%.  Her blood pressure today is mildly increased on her regimen of losartan HCT 100/12.5 mg and she has been taking metoprolol tartrate 100 mg twice a day.  With her previous LV dysfunction, I am recommending changing metoprolol to metoprolol succinate preparation 100 mg daily.  I am scheduling her for an echo Doppler study to reassess LV function.  If EF remains depressed, I discussed the possibility of transitioning her [facility] therapy to [facility]  I will recheck fasting laboratory including a comprehensive metabolic panel, CBC, TSH, lipid studies, as well as baseline BMP.  I will see her in 3 weeks for follow-up evaluation and at that time medication adjustment will be undertaken if necessary.  She continues to be on aspirin.  She is on pantoprazole for GERD.  She is on alendronate for age-related osteoporosis.   Medication Adjustments/Labs and Tests Ordered: Current medicines are reviewed at length with the patient today.  Concerns regarding medicines are outlined above.  Medication changes, Labs and Tests ordered today are listed in the Patient Instructions below. Patient Instructions  Medication Instructions:  STOP YOUR METOPROLOL TARTRATE START TAKING METOPROLOL SUCCINATE 100MG DAILY  *If you need a refill on your cardiac medications before your next appointment, please call your pharmacy*   Lab Work: FASTING LABS: CMET CBC TSH LIPID BNP If you have labs (blood work) drawn today and your tests are completely normal, you will receive your results only by: Marland Kitchen MyChart Message (if you have MyChart) OR . A paper copy in the mail If you  have any lab test that is abnormal or we need to change your treatment, we will call you to review the results.   Testing/Procedures: Your physician has requested that you have an echocardiogram. Echocardiography is a painless test that uses sound waves to create images of your heart. It provides your doctor with information about the size and shape of your heart and how well your heart's chambers and valves are working. This procedure takes approximately one hour. There are no restrictions for this procedure.     Follow-Up: At [facility], you and your health needs are our priority.  As part of our continuing mission to provide you with exceptional heart care, we have created designated Provider Care Teams.  These Care Teams include your primary Cardiologist (physician) and Advanced Practice Providers (APPs -  Physician Assistants and Nurse Practitioners) who all work together to provide you with the care you need, when you need it.  We recommend signing up for the patient portal called "MyChart".  Sign up information is provided on this After Visit Summary.  MyChart is used to connect with patients for Virtual Visits (Telemedicine).  Patients are able to view lab/test results, encounter notes, upcoming appointments, etc.  Non-urgent messages can be sent to your provider as well.   To learn more about what you can do with MyChart, go to  [email or web address].    Your next appointment:   04/14/20 AT 8AM  The format for your next appointment:   In Person  Provider:   Shelva Majestic, MD       Signed, Shelva Majestic, MD  04/04/2020 10:17 AM    [place] [address], [place], [place], [place]  [address] Phone: [phone]

## 2020-03-30 NOTE — Patient Instructions (Signed)
Medication Instructions:  STOP YOUR METOPROLOL TARTRATE START TAKING METOPROLOL SUCCINATE 100MG  DAILY  *If you need a refill on your cardiac medications before your next appointment, please call your pharmacy*   Lab Work: FASTING LABS: CMET CBC TSH LIPID BNP If you have labs (blood work) drawn today and your tests are completely normal, you will receive your results only by: Marland Kitchen MyChart Message (if you have MyChart) OR . A paper copy in the mail If you have any lab test that is abnormal or we need to change your treatment, we will call you to review the results.   Testing/Procedures: Your physician has requested that you have an echocardiogram. Echocardiography is a painless test that uses sound waves to create images of your heart. It provides your doctor with information about the size and shape of your heart and how well your heart's chambers and valves are working. This procedure takes approximately one hour. There are no restrictions for this procedure.     Follow-Up: At [facility], you and your health needs are our priority.  As part of our continuing mission to provide you with exceptional heart care, we have created designated Provider Care Teams.  These Care Teams include your primary Cardiologist (physician) and Advanced Practice Providers (APPs -  Physician Assistants and Nurse Practitioners) who all work together to provide you with the care you need, when you need it.  We recommend signing up for the patient portal called "MyChart".  Sign up information is provided on this After Visit Summary.  MyChart is used to connect with patients for Virtual Visits (Telemedicine).  Patients are able to view lab/test results, encounter notes, upcoming appointments, etc.  Non-urgent messages can be sent to your provider as well.   To learn more about what you can do with MyChart, go to [email or web address].    Your next appointment:   04/14/20 AT 8AM  The format for your next  appointment:   In Person  Provider:   Shelva Majestic, MD

## 2020-04-04 ENCOUNTER — Encounter: Payer: Self-pay | Admitting: Cardiovascular Disease

## 2020-04-05 DIAGNOSIS — I5042 Chronic combined systolic (congestive) and diastolic (congestive) heart failure: Secondary | ICD-10-CM | POA: Diagnosis not present

## 2020-04-05 DIAGNOSIS — I10 Essential (primary) hypertension: Secondary | ICD-10-CM | POA: Diagnosis not present

## 2020-04-05 DIAGNOSIS — T451X5A Adverse effect of antineoplastic and immunosuppressive drugs, initial encounter: Secondary | ICD-10-CM | POA: Diagnosis not present

## 2020-04-05 DIAGNOSIS — I427 Cardiomyopathy due to drug and external agent: Secondary | ICD-10-CM | POA: Diagnosis not present

## 2020-04-06 LAB — BRAIN NATRIURETIC PEPTIDE: BNP: 115.7 pg/mL — ABNORMAL HIGH (ref 0.0–100.0)

## 2020-04-06 LAB — TSH: TSH: 2.73 u[IU]/mL (ref 0.450–4.500)

## 2020-04-06 LAB — LIPID PANEL
Chol/HDL Ratio: 3.4 ratio (ref 0.0–4.4)
Cholesterol, Total: 195 mg/dL (ref 100–199)
HDL: 57 mg/dL (ref >39)
LDL Chol Calc (NIH): 107 mg/dL — ABNORMAL HIGH (ref 0–99)
Triglycerides: 177 mg/dL — ABNORMAL HIGH (ref 0–149)
VLDL Cholesterol Cal: 31 mg/dL (ref 5–40)

## 2020-04-06 LAB — COMPREHENSIVE METABOLIC PANEL
ALT: 12 IU/L (ref 0–32)
AST: 20 IU/L (ref 0–40)
Albumin/Globulin Ratio: 2.5 — ABNORMAL HIGH (ref 1.2–2.2)
Albumin: 4.2 g/dL (ref 3.7–4.7)
Alkaline Phosphatase: 60 IU/L (ref 48–121)
BUN/Creatinine Ratio: 13 (ref 12–28)
BUN: 19 mg/dL (ref 8–27)
Bilirubin Total: 0.4 mg/dL (ref 0.0–1.2)
CO2: 27 mmol/L (ref 20–29)
Calcium: 9.5 mg/dL (ref 8.7–10.3)
Chloride: 103 mmol/L (ref 96–106)
Creatinine, Ser: 1.48 mg/dL — ABNORMAL HIGH (ref 0.57–1.00)
GFR calc Af Amer: 39 mL/min/{1.73_m2} — ABNORMAL LOW (ref >59)
GFR calc non Af Amer: 34 mL/min/{1.73_m2} — ABNORMAL LOW (ref >59)
Globulin, Total: 1.7 g/dL (ref 1.5–4.5)
Glucose: 121 mg/dL — ABNORMAL HIGH (ref 65–99)
Potassium: 5.2 mmol/L (ref 3.5–5.2)
Sodium: 144 mmol/L (ref 134–144)
Total Protein: 5.9 g/dL — ABNORMAL LOW (ref 6.0–8.5)

## 2020-04-06 LAB — CBC
Hematocrit: 45.2 % (ref 34.0–46.6)
Hemoglobin: 14.7 g/dL (ref 11.1–15.9)
MCH: 28.1 pg (ref 26.6–33.0)
MCHC: 32.5 g/dL (ref 31.5–35.7)
MCV: 86 fL (ref 79–97)
Platelets: 156 10*3/uL (ref 150–450)
RBC: 5.24 x10E6/uL (ref 3.77–5.28)
RDW: 13 % (ref 11.7–15.4)
WBC: 4.8 10*3/uL (ref 3.4–10.8)

## 2020-04-13 ENCOUNTER — Other Ambulatory Visit: Payer: Self-pay

## 2020-04-13 ENCOUNTER — Ambulatory Visit (HOSPITAL_COMMUNITY): Payer: Medicare Other | Attending: Internal Medicine

## 2020-04-13 DIAGNOSIS — T451X5A Adverse effect of antineoplastic and immunosuppressive drugs, initial encounter: Secondary | ICD-10-CM | POA: Diagnosis not present

## 2020-04-13 DIAGNOSIS — I5042 Chronic combined systolic (congestive) and diastolic (congestive) heart failure: Secondary | ICD-10-CM

## 2020-04-13 DIAGNOSIS — I10 Essential (primary) hypertension: Secondary | ICD-10-CM | POA: Insufficient documentation

## 2020-04-13 DIAGNOSIS — I427 Cardiomyopathy due to drug and external agent: Secondary | ICD-10-CM | POA: Diagnosis not present

## 2020-04-13 LAB — ECHOCARDIOGRAM COMPLETE
Area-P 1/2: 3.17 cm2
Calc EF: 62.6 %
S' Lateral: 3.1 cm
Single Plane A2C EF: 60.7 %
Single Plane A4C EF: 63.9 %

## 2020-04-14 ENCOUNTER — Ambulatory Visit: Payer: Medicare Other | Admitting: Cardiovascular Disease

## 2020-04-15 ENCOUNTER — Ambulatory Visit (INDEPENDENT_AMBULATORY_CARE_PROVIDER_SITE_OTHER): Payer: Medicare Other | Admitting: Cardiovascular Disease

## 2020-04-15 ENCOUNTER — Other Ambulatory Visit: Payer: Self-pay

## 2020-04-15 ENCOUNTER — Encounter: Payer: Self-pay | Admitting: Cardiovascular Disease

## 2020-04-15 VITALS — BP 157/87 | HR 61 | Ht 63.0 in | Wt 143.0 lb

## 2020-04-15 DIAGNOSIS — I427 Cardiomyopathy due to drug and external agent: Secondary | ICD-10-CM | POA: Diagnosis not present

## 2020-04-15 DIAGNOSIS — N1832 Chronic kidney disease, stage 3b: Secondary | ICD-10-CM

## 2020-04-15 DIAGNOSIS — T451X5D Adverse effect of antineoplastic and immunosuppressive drugs, subsequent encounter: Secondary | ICD-10-CM

## 2020-04-15 DIAGNOSIS — M810 Age-related osteoporosis without current pathological fracture: Secondary | ICD-10-CM

## 2020-04-15 DIAGNOSIS — K219 Gastro-esophageal reflux disease without esophagitis: Secondary | ICD-10-CM | POA: Diagnosis not present

## 2020-04-15 DIAGNOSIS — I10 Essential (primary) hypertension: Secondary | ICD-10-CM | POA: Diagnosis not present

## 2020-04-15 DIAGNOSIS — T451X5A Adverse effect of antineoplastic and immunosuppressive drugs, initial encounter: Secondary | ICD-10-CM

## 2020-04-15 MED ORDER — AMLODIPINE BESYLATE 2.5 MG PO TABS: 2.5000 mg | ORAL_TABLET | Freq: Every day | ORAL | 2 refills | Status: DC

## 2020-04-15 NOTE — Patient Instructions (Addendum)
Medication Instructions:  BEGIN TAKING AMLODIPINE 2.5MG  DAILY  *If you need a refill on your cardiac medications before your next appointment, please call your pharmacy*   Lab Work: PRIOR TO YOUR NEXT OFFICE VISIT- FASTING LABS: CMET LIPID  If you have labs (blood work) drawn today and your tests are completely normal, you will receive your results only by: Marland Kitchen MyChart Message (if you have MyChart) OR . A paper copy in the mail If you have any lab test that is abnormal or we need to change your treatment, we will call you to review the results.     Follow-Up: At [facility], you and your health needs are our priority.  As part of our continuing mission to provide you with exceptional heart care, we have created designated Provider Care Teams.  These Care Teams include your primary Cardiologist (physician) and Advanced Practice Providers (APPs -  Physician Assistants and Nurse Practitioners) who all work together to provide you with the care you need, when you need it.  We recommend signing up for the patient portal called "MyChart".  Sign up information is provided on this After Visit Summary.  MyChart is used to connect with patients for Virtual Visits (Telemedicine).  Patients are able to view lab/test results, encounter notes, upcoming appointments, etc.  Non-urgent messages can be sent to your provider as well.   To learn more about what you can do with MyChart, go to [email or web address].    Your next appointment:   4 month(s)  The format for your next appointment:   In Person  Provider:   Shelva Majestic MD   Other Instructions AMBULATORY REFERRAL TO neurosurgery- DR. ELSNER

## 2020-04-17 ENCOUNTER — Encounter: Payer: Self-pay | Admitting: Cardiovascular Disease

## 2020-04-17 DIAGNOSIS — Z23 Encounter for immunization: Secondary | ICD-10-CM | POA: Diagnosis not present

## 2020-04-17 NOTE — Progress Notes (Signed)
Cardiology Office Note    Date:  04/17/2020   ID:  SCOTT FIX, DOB 1941-05-17, MRN [id number]  PCP:  Denita Lung, MD  Cardiologist:  Shelva Majestic, MD   2-week follow-up evaluation  History of Present Illness:  Tonya Wise is a 79 y.o. female who was diagnosed as having non-Hodgkin's lymphoma in 2014. She received 2 treatments of chemotherapy. On 06/16/2013 she underwent a followup CT scan which showed slight interval reduction in axillary lymphadenopathy but there was a 1 small residual right axillary lymph node measuring 16 a 12 mm. She also is noted a significant reduction in mediastinal and hilar adenopathy. However, it was noted that she had a left-sided pulmonary embolism. There also was resolution of bulky abdominal lymphadenopathy as well as interval decrease in the size of her spleen since initiating chemotherapy. Because of the diagnosis of left-sided pulmonary embolism she apparently started therapy with combination Lovenox as well as oral Coumadin. She apparently was seen in Dr. Lanice Shirts office on 06/17/2013 with complaints of increasing cough that has become productive. She was started on Zithromax for possible acute bronchitis. She was tachycardic and did have T-wave changes.   A 2-D echo Doppler study in September 2014 while she was hospitalized at [facility] demonstrated an ejection fraction was 50-55%. She had pulmonary hypertension with estimated RV systolic pressure at 39 mm and Grade 1 diastolic dysfunction.  When I initially saw her in November 2014, I  recommend that she undergo a nuclear perfusion study because of some  chest discomfort. Her nuclear perfusion study demonstrated normal perfusion, however ejection fraction was interpreted at 42%. Subsequently, a followup echo Doppler study was recommended and also discuss this with her oncologist, Dr. Juliann Mule. Her followup echo Doppler study on 07/16/2013 confirmed LV dysfunction and ejection fraction was  now 35-40%. There was diffuse hypokinesis. She now had grade 2 diastolic dysfunction. Due to her reduction of LV function, subsequent chemotherapyhas been at a reduced dose. She completed cycle 5 of mini-R-CEO. She does have thrombocytopenia and when last seen by Dr. Juliann Mule her platelets were 59,000. She is scheduled to undergo an additional week of recovery prior to undergoing a next course of chemotherapy.  She completed her last chemotherapy dose in March 2015.  Recently, she has had issues with her blood pressure being elevated.  She states her platelet count has improved and most recently this has been 122,000.  I saw her 3 months ago, her ECG revealed sinus bradycardia on her dose of metoprolol, tartrate at 37.5 twice a day, and I did not further increase this; however, with her previous diastolic dysfunction.  Iurther titrated her losartan to 100 mg daily.  She has been taking this 50 mg twice a day.  I recommended that she have a follow-up echo Doppler study which was done on 09/21/2014 and demonstrated an ejection fraction in the range of 35-40% with previously noted diffuse hypokinesis.  She again had a  pseudonormal left ventricular filling pattern consistent with grade 2 diastolic dysfunction.  There was mild mitral regurgitation.  She states when she was recently seen at the cancer center.  Her blood pressure was elevated.  However, she was anxious that they said she was having a follow-up CT scan.  She was evaluated by Bernerd Pho, PA-C in June 2018.  At that time she continued to feel well but did experience occasional fatigue but remained active.  In September 2019 she was evaluated by Fabian Sharp and prior to  that evaluation was having some difficulty with blood pressure control.  During that evaluation she was doing well on an increased dose of Lopressor 100 mg twice a day and losartan HCT 100/12.5 mg.  She has continued to be followed by Dr. Earlie Server for her diffuse large B cell  non-Hodgkin's lymphoma.  She is felt to be stable.  She recently was seen by Dr. Jill Alexanders who is her primary MD.  During his evaluation she was hypertensive and there was concern of some development of renal insufficiency.  Laboratory in May 2021 showed mild thrombocytopenia with a platelet count of 127.  Creatinine was 1.38 consistent with stage IIIb CKD.  After not having seen her since May 2016, I saw her on 03/30/2020 for cardiology evaluation.  At that time, she was doing well and remained active doing both gardening and yard work as well as housework.  She specifically denied any chest pain.  She did experience some mild shortness of breath particularly when walking uphill.  She had not had any recent assessment of LV function and presented for evaluation.  During that evaluation I recommended she undergo a follow-up echo Doppler study which was done on 04/13/2020.  This reveals EF at 55 to 60% with mild grade 1 diastolic dysfunction.  She did not have any regional wall motion abnormalities.  There was mild mitral regurgitation.  Ejection fraction had improved from February 2016 at which time it was noted to be 35 to 40%.  Presently, she feels well.  She is experiencing chronic low back pain.  She also has undergone prior disc surgery with involving 4 discs.  She had had neurosurgery done in 2004 and 2005.  She has not been reevaluated by her neurosurgeon who no longer is in practice.  She presents for evaluation.  Past Medical History:  Diagnosis Date  . Allergy   . Anemia   . Ankle fracture, left   . Chemotherapy induced cardiomyopathy ([place])    a. 08/2014: echo showing EF of 35-40% with Grade 2 DD and mild MR  . Diastolic dysfunction, grade I 04/12/2013  . GERD (gastroesophageal reflux disease)    Tums or Maalox  . Heart murmur   . Hypercalcemia 03/27/2013  . Hypertension   . Non-Hodgkins lymphoma ([place]) 04/06/2013  . Osteopenia   . Renal insufficiency     Past Surgical History:   Procedure Laterality Date  . ABDOMINAL HYSTERECTOMY    . APPENDECTOMY    . BACK SURGERY     LOWER BACK TWICE  . COLONOSCOPY  2008  . ESOPHAGOGASTRODUODENOSCOPY N/A 03/29/2013   Procedure: ESOPHAGOGASTRODUODENOSCOPY (EGD);  Surgeon: Juanita Craver, MD;  Location: [facility] ENDOSCOPY;  Service: Endoscopy;  Laterality: N/A;  . INGUINAL LYMPH NODE BIOPSY Right 03/31/2013   Procedure: INGUINAL LYMPH NODE BIOPSY;  Surgeon: Gwenyth Ober, MD;  Location: [place];  Service: General;  Laterality: Right;  . ORIF ANKLE FRACTURE Left 11/03/2015   Procedure: OPEN REDUCTION INTERNAL FIXATION (ORIF) LEFT ANKLE FRACTURE;  Surgeon: Wylene Simmer, MD;  Location: [place];  Service: Orthopedics;  Laterality: Left;  . SPINE SURGERY      Current Medications: Outpatient Medications Prior to Visit  Medication Sig Dispense Refill  . alendronate (FOSAMAX) 70 MG tablet TAKE 1 TABLET BY MOUTH EVERY 7 DAYS. TAKE WITH A FULL GLASS OF WATER ON AN EMPTY STOMACH 12 tablet 0  . aspirin EC 81 MG tablet Take 1 tablet (81 mg total) by mouth 2 (two) times daily. (Patient taking  differently: Take 81 mg by mouth daily. ) 90 tablet 0  . cholecalciferol (VITAMIN D) 1000 units tablet Take 1,000 Units by mouth daily.    . clarithromycin (BIAXIN) 500 MG tablet Take 1 tablet (500 mg total) by mouth 2 (two) times daily. 20 tablet 0  . cyanocobalamin 500 MCG tablet Take 500 mcg by mouth daily.     Marland Kitchen ibuprofen (ADVIL,MOTRIN) 200 MG tablet Take 400 mg by mouth every 6 (six) hours as needed.    Marland Kitchen losartan-hydrochlorothiazide (HYZAAR) 100-12.5 MG tablet TAKE 1 TABLET BY MOUTH EVERY DAY 90 tablet 0  . metoprolol succinate (TOPROL-XL) 100 MG 24 hr tablet Take 1 tablet (100 mg total) by mouth daily. Take with or immediately following a meal. 90 tablet 3  . RESTASIS 0.05 % ophthalmic emulsion     . pantoprazole (PROTONIX) 40 MG tablet Take 1 tablet (40 mg total) by mouth daily. 30 tablet 0   No facility-administered medications prior to  visit.     Allergies:   Prednisone and Penicillins   Social History   Socioeconomic History  . Marital status: Married    Spouse name: [name]  . Number of children: 2  . Years of education: 64  . Highest education level: Not on file  Occupational History    Employer: [employer]  Tobacco Use  . Smoking status: Never Smoker  . Smokeless tobacco: Never Used  Substance and Sexual Activity  . Alcohol use: No  . Drug use: No  . Sexual activity: Yes    Partners: Male  Other Topics Concern  . Not on file  Social History Narrative   Married.  Lives in [place] with husband.     Social Determinants of Health   Financial Resource Strain:   . Difficulty of Paying Living Expenses: Not on file  Food Insecurity:   . Worried About Charity fundraiser in the Last Year: Not on file  . Ran Out of Food in the Last Year: Not on file  Transportation Needs:   . Lack of Transportation (Medical): Not on file  . Lack of Transportation (Non-Medical): Not on file  Physical Activity:   . Days of Exercise per Week: Not on file  . Minutes of Exercise per Session: Not on file  Stress:   . Feeling of Stress : Not on file  Social Connections:   . Frequency of Communication with Friends and Family: Not on file  . Frequency of Social Gatherings with Friends and Family: Not on file  . Attends Religious Services: Not on file  . Active Member of Clubs or Organizations: Not on file  . Attends Archivist Meetings: Not on file  . Marital Status: Not on file    Social history is notable in that she is married for 60 years. She completed 12 grade of education. She is retired. She has 2 children, 60, and 56 and 2 grandchildren, 50 and 39.. No tobacco use presently. She quit remotely in 1962. There is no alcohol use. She does not routinely exercise.  Family History:  The patient's family history includes Diabetes in her brother and sister; Heart attack (age of onset: 52) in her father; Leukemia  in her mother.   ROS General: Negative; No fevers, chills, or night sweats;  HEENT: Negative; No changes in vision or hearing, sinus congestion, difficulty swallowing Pulmonary: Negative; No cough, wheezing, shortness of breath, hemoptysis Cardiovascular: See HPI GI: Negative; No nausea, vomiting, diarrhea, or abdominal pain GU: Negative; No  dysuria, hematuria, or difficulty voiding Musculoskeletal: Chronic low back discomfort which has progressed.  Status post 2 back surgeries 2004 and 2005. 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   PHYSICAL EXAM:   VS:  BP (!) 157/87   Pulse 61   Ht '5\' 3"'  (1.6 m)   Wt 143 lb (64.9 kg)   SpO2 99%   BMI 25.33 kg/m     Repeat blood pressure by me was elevated at 158/84  Wt Readings from Last 3 Encounters:  04/15/20 143 lb (64.9 kg)  03/30/20 140 lb 12.8 oz (63.9 kg)  03/09/20 144 lb 12.8 oz (65.7 kg)      Physical Exam BP (!) 157/87   Pulse 61   Ht '5\' 3"'  (1.6 m)   Wt 143 lb (64.9 kg)   SpO2 99%   BMI 25.33 kg/m  General: Alert, oriented, no distress.  Skin: normal turgor, no rashes, warm and dry HEENT: Normocephalic, atraumatic. Pupils equal round and reactive to light; sclera anicteric; extraocular muscles intact;  Nose without nasal septal hypertrophy Mouth/Parynx benign; Mallinpatti scale 2 Neck: No JVD, no carotid bruits; normal carotid upstroke Lungs: clear to ausculatation and percussion; no wheezing or rales Chest wall: without tenderness to palpitation Heart: PMI  not displaced, RRR, s1 s2 normal, 1/6 systolic murmur, no diastolic murmur, no rubs, gallops, thrills, or heaves Abdomen: soft, nontender; no hepatosplenomehaly, BS+; abdominal aorta nontender and not dilated by palpation. Back: no CVA tenderness Pulses 2+ Musculoskeletal: full range of motion, normal strength, no joint deformities Extremities: no clubbing cyanosis or edema, Homan's sign negative  Neurologic: grossly nonfocal; Cranial nerves grossly wnl Psychologic: Normal mood and affect   Studies/Labs Reviewed:   03/30/2020 ECG (independently read by me): Sinus bradycardia at 58; QS V1-2; no ectopy, normal intervals  Recent Labs: BMP Latest Ref Rng & Units 04/05/2020 12/07/2019 12/05/2018  Glucose 65 - 99 mg/dL 121(H) 100(H) 109(H)  BUN 8 - 27 mg/dL '19 20 14  ' Creatinine 0.57 - 1.00 mg/dL 1.48(H) 1.38(H) 1.30(H)  BUN/Creat Ratio 12 - 28 13 - -  Sodium 134 - 144 mmol/L 144 142 142  Potassium 3.5 - 5.2 mmol/L 5.2 3.7 4.0  Chloride 96 - 106 mmol/L 103 104 103  CO2 20 - 29 mmol/L '27 28 31  ' Calcium 8.7 - 10.3 mg/dL 9.5 9.2 9.6     Hepatic Function Latest Ref Rng & Units 04/05/2020 12/07/2019 12/05/2018  Total Protein 6.0 - 8.5 g/dL 5.9(L) 6.0(L) 6.5  Albumin 3.7 - 4.7 g/dL 4.2 3.6 4.0  AST 0 - 40 IU/L 20 15 14(L)  ALT 0 - 32 IU/L '12 9 10  ' Alk Phosphatase 48 - 121 IU/L 60 52 53  Total Bilirubin 0.0 - 1.2 mg/dL 0.4 0.5 0.6    CBC Latest Ref Rng & Units 04/05/2020 12/07/2019 12/05/2018  WBC 3.4 - 10.8 x10E3/uL 4.8 4.3 5.0  Hemoglobin 11.1 - 15.9 g/dL 14.7 13.7 15.3(H)  Hematocrit 34.0 - 46.6 % 45.2 42.4 47.5(H)  Platelets 150 - 450 x10E3/uL 156 127(L) 130(L)   Lab Results  Component Value Date   MCV 86 04/05/2020   MCV 88.5 12/07/2019   MCV 87.2 12/05/2018  Lab Results  Component Value Date   TSH 2.730 04/05/2020   Lab Results  Component Value Date   HGBA1C 5.4 01/14/2019     BNP    Component Value Date/Time   BNP 115.7 (H) 04/05/2020 0928   BNP 547.6 (H) 06/17/2013 1704     ProBNP    Component Value Date/Time   PROBNP 21,795.0 (H) 08/21/2013 1905     Lipid Panel     Component Value Date/Time   CHOL 195 04/05/2020 0928   TRIG 177 (H) 04/05/2020 0928   HDL 57 04/05/2020 0928   CHOLHDL 3.4 04/05/2020 0928   CHOLHDL 3.4 12/27/2016 1029   VLDL 28 12/27/2016 1029   LDLCALC 107 (H) 04/05/2020 0928   LABVLDL 31 04/05/2020 0928     RADIOLOGY: ECHOCARDIOGRAM COMPLETE  Result Date: 04/13/2020    ECHOCARDIOGRAM REPORT   Patient Name:   Tonya Wise Osowski Date of Exam: 04/13/2020 Medical Rec #:  [id number]      Height:       63.0 in Accession #:    [id number]     Weight:       140.8 lb Date of Birth:  03-06-1941      BSA:          1.666 m Patient Age:    69 years       BP:           148/72 mmHg Patient Gender: F              HR:           67 bpm. Exam Location:  [facility] Procedure: 2D Echo, Cardiac Doppler and Color Doppler Indications:    I50.42 CHF  History:        Patient has prior history of Echocardiogram examinations, most                 recent 09/21/2014. CHF and Cardiomyopathy; Risk                 Factors:Hypertension and Dyslipidemia. CKD stage 3. H/o B-cell                 lymphoma.  Sonographer:    Jessee Avers, RDCS Referring Phys: [place]  1. Left ventricular ejection fraction, by estimation, is 55 to 60%. The left ventricle has normal function. The left ventricle has no regional wall motion abnormalities. Left ventricular diastolic parameters are consistent with Grade I diastolic dysfunction (impaired relaxation). The E/e' is 12.  2. Right ventricular systolic function is normal. The right ventricular size is normal.  3. The mitral valve is abnormal. Mild mitral valve regurgitation.  4. The aortic valve is tricuspid. Aortic valve regurgitation is not visualized.  5. The inferior vena cava is normal in size with greater than 50% respiratory variability, suggesting right atrial pressure of 3 mmHg. Comparison(s): 09/21/14 EF 35-40%.  FINDINGS  Left Ventricle: Left ventricular ejection fraction, by estimation, is 55 to 60%. The left ventricle has normal function. The left ventricle has no regional wall motion abnormalities. The left ventricular internal cavity size was normal in size. There is  no left ventricular hypertrophy. Left ventricular diastolic parameters are consistent with Grade I diastolic dysfunction (impaired relaxation). Indeterminate filling pressures. The E/e' is 12. Right Ventricle: The right ventricular size is normal. No increase in right ventricular wall thickness. Right ventricular systolic function is normal. Left Atrium: Left atrial size was normal in size. Right Atrium: Right atrial size  was normal in size. Pericardium: There is no evidence of pericardial effusion. Mitral Valve: The mitral valve is abnormal. There is mild thickening of the mitral valve leaflet(s). Mild mitral valve regurgitation. Tricuspid Valve: The tricuspid valve is grossly normal. Tricuspid valve regurgitation is trivial. Aortic Valve: The aortic valve is tricuspid. Aortic valve regurgitation is not visualized. Pulmonic Valve: The pulmonic valve was normal in structure. Pulmonic valve regurgitation is not visualized. Aorta: The aortic root and ascending aorta are structurally normal, with no evidence of dilitation. Venous: The inferior vena cava is normal in size with greater than 50% respiratory variability, suggesting right atrial pressure of 3 mmHg. IAS/Shunts: No atrial level shunt detected by color flow Doppler.  LEFT VENTRICLE PLAX 2D LVIDd:         4.50 cm     Diastology LVIDs:         3.10 cm     LV e' medial:    4.13 cm/s LV PW:         1.00 cm     LV E/e' medial:  16.0 LV IVS:        0.90 cm     LV e' lateral:   7.83 cm/s LVOT diam:     2.00 cm     LV E/e' lateral: 8.5 LV SV:         59 LV SV Index:   36 LVOT Area:     3.14 cm  LV Volumes (MOD) LV vol d, MOD A2C: 43.5 ml LV vol d, MOD A4C: 84.6 ml LV vol s, MOD A2C: 17.1 ml LV vol s, MOD  A4C: 30.5 ml LV SV MOD A2C:     26.4 ml LV SV MOD A4C:     84.6 ml LV SV MOD BP:      39.2 ml RIGHT VENTRICLE RV Basal diam:  2.90 cm RV S prime:     11.70 cm/s TAPSE (M-mode): 1.7 cm LEFT ATRIUM             Index       RIGHT ATRIUM           Index LA diam:        3.90 cm 2.34 cm/m  RA Pressure: 3.00 mmHg LA Vol (A2C):   48.0 ml 28.82 ml/m RA Area:     10.00 cm LA Vol (A4C):   46.5 ml 27.92 ml/m RA Volume:   19.30 ml  11.59 ml/m LA Biplane Vol: 47.2 ml 28.34 ml/m  AORTIC VALVE LVOT Vmax:   83.00 cm/s LVOT Vmean:  52.100 cm/s LVOT VTI:    0.189 m  AORTA Ao Root diam: 3.00 cm Ao Asc diam:  3.30 cm MITRAL VALVE               TRICUSPID VALVE                            Estimated RAP:  3.00 mmHg  MV E velocity: 66.20 cm/s  SHUNTS MV A velocity: 71.30 cm/s  Systemic VTI:  0.19 m MV E/A ratio:  0.93        Systemic Diam: 2.00 cm Lyman Bishop MD Electronically signed by Lyman Bishop MD Signature Date/Time: 04/13/2020/5:04:18 PM    Final      Additional studies/ records that were reviewed today include:  I reviewed the patient's record since my last evaluation over 5 years ago  ASSESSMENT:    1. Essential hypertension  2. Chemotherapy induced cardiomyopathy ([place])   3. Age-related osteoporosis without current pathological fracture   4. Stage 3b chronic kidney disease   5. Gastroesophageal reflux disease without esophagitis     PLAN:  Tonya Wise is a very pleasant 79 year old young appearing female who has a history of non-Hodgkin's lymphoma initially diagnosed in September 2014.  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  Her lymphoma fortunately has stabilized.   CT in May 2021 did not demonstrate any residual evidence for recurrent lymphoma within the chest, abdomen or pelvis.  She has had issues with hypertension.  A prior echo Doppler study in February 2016 showed an EF of 35 to  40%.  When I saw her on 03/30/2020 after not having seen her since 2016, her blood pressure was elevated.  With her previous LV dysfunction I recommended she change from metoprolol tartrate 100 mg twice a day to metoprolol succinate 100 mg daily.  I reviewed her most recent echo Doppler study from 04/13/2020 which now shows normalization of LV function with only mild grade 1 diastolic dysfunction.  Her blood pressure today is elevated, and I am recommending the addition of amlodipine to start off at 2.5 mg daily.  She complains of her chronic low back pain.  She had previously undergone 2 back surgeries and her neurosurgeon has retired.  I have referred her to see Dr. Kristeen Miss for further evaluation.  She continues to be on losartan HCT 100/12.5 mg and is tolerating metoprolol succinate 100 mg daily.  I reviewed her recent laboratory.  She does have stage III chronic kidney disease and her creatinine is 1.48.  LFTs were normal.  Total cholesterol was 195 with slight elevation of triglycerides of 177 and LDL cholesterol at 107.  She sees Dr. Jill Alexanders for her primary care who checks laboratory.  She continues to be on alendronate for her age-related osteoporosis and is on pantoprazole for GERD.  I am recommending follow-up laboratory in 4 months with a chemistry profile and lipid studies and adjustments to her medical regimen may be necessary depending upon laboratory results.   Medication Adjustments/Labs and Tests Ordered: Current medicines are reviewed at length with the patient today.  Concerns regarding medicines are outlined above.  Medication changes, Labs and Tests ordered today are listed in the Patient Instructions below. Patient Instructions  Medication Instructions:  BEGIN TAKING AMLODIPINE 2.5MG DAILY  *If you need a refill on your cardiac medications before your next appointment, please call your pharmacy*   Lab Work: PRIOR TO YOUR NEXT OFFICE VISIT- FASTING LABS: CMET LIPID  If you  have labs (blood work) drawn today and your tests are completely normal, you will receive your results only by: Marland Kitchen MyChart Message (if you have MyChart) OR . A paper copy in the mail If you have any lab test that is abnormal or we need to change your treatment, we will call you to review the results.     Follow-Up: At [facility], you and your health needs are our priority.  As part of our continuing mission to provide you with exceptional heart care, we have created designated Provider Care Teams.  These Care Teams include your primary Cardiologist (physician) and Advanced Practice Providers (APPs -  Physician Assistants and Nurse Practitioners) who all work together to provide you with the care you need, when you need it.  We recommend signing up for the patient  portal called "MyChart".  Sign up information is provided on this After Visit Summary.  MyChart is used to connect with patients for Virtual Visits (Telemedicine).  Patients are able to view lab/test results, encounter notes, upcoming appointments, etc.  Non-urgent messages can be sent to your provider as well.   To learn more about what you can do with MyChart, go to [email or web address].    Your next appointment:   4 month(s)  The format for your next appointment:   In Person  Provider:   Shelva Majestic MD   Other Instructions [place]- DR. Ellene Route     Signed, Shelva Majestic, MD  04/17/2020 11:28 AM    [facility] [address] [place], [place], [place]  [address] Phone: [phone]

## 2020-04-18 ENCOUNTER — Telehealth: Payer: Self-pay | Admitting: Internal Medicine

## 2020-04-18 NOTE — Telephone Encounter (Signed)
Scheduled appointment per 9/17 scheduling message. Called patient, no answer. Left message for patient with appointment date and time.

## 2020-04-22 ENCOUNTER — Telehealth: Payer: Self-pay

## 2020-04-22 NOTE — Telephone Encounter (Signed)
04-22-2020  Spoke w patient veryifying her appointment  w Dr Ellene Route.  She requested a copy of the appointment.  Mail her a copy on  04-22-2020.  VB

## 2020-04-25 ENCOUNTER — Inpatient Hospital Stay: Payer: Medicare Other

## 2020-04-26 ENCOUNTER — Other Ambulatory Visit: Payer: Self-pay | Admitting: Family Medicine

## 2020-04-26 DIAGNOSIS — M810 Age-related osteoporosis without current pathological fracture: Secondary | ICD-10-CM

## 2020-04-26 NOTE — Telephone Encounter (Signed)
Ok to refill this? 

## 2020-05-02 ENCOUNTER — Other Ambulatory Visit: Payer: Self-pay

## 2020-05-02 ENCOUNTER — Inpatient Hospital Stay: Payer: Medicare Other | Attending: Internal Medicine

## 2020-05-02 DIAGNOSIS — Z23 Encounter for immunization: Secondary | ICD-10-CM

## 2020-05-08 ENCOUNTER — Other Ambulatory Visit: Payer: Self-pay | Admitting: Family Medicine

## 2020-05-08 DIAGNOSIS — I10 Essential (primary) hypertension: Secondary | ICD-10-CM

## 2020-05-10 ENCOUNTER — Other Ambulatory Visit: Payer: Self-pay | Admitting: Cardiovascular Disease

## 2020-05-11 NOTE — Telephone Encounter (Signed)
Rx request sent to pharmacy.  

## 2020-05-24 DIAGNOSIS — M48061 Spinal stenosis, lumbar region without neurogenic claudication: Secondary | ICD-10-CM | POA: Diagnosis not present

## 2020-05-24 DIAGNOSIS — I10 Essential (primary) hypertension: Secondary | ICD-10-CM | POA: Diagnosis not present

## 2020-05-25 ENCOUNTER — Other Ambulatory Visit: Payer: Self-pay | Admitting: Neurological Surgery

## 2020-05-25 DIAGNOSIS — M48061 Spinal stenosis, lumbar region without neurogenic claudication: Secondary | ICD-10-CM

## 2020-06-08 ENCOUNTER — Ambulatory Visit (HOSPITAL_COMMUNITY)
Admission: RE | Admit: 2020-06-08 | Discharge: 2020-06-08 | Disposition: A | Discharge status: Home / Self Care | Payer: Medicare Other | Source: Ambulatory Visit | Attending: Neurological Surgery | Admitting: Neurological Surgery

## 2020-06-08 ENCOUNTER — Other Ambulatory Visit: Payer: Self-pay

## 2020-06-08 DIAGNOSIS — M48061 Spinal stenosis, lumbar region without neurogenic claudication: Secondary | ICD-10-CM

## 2020-06-08 DIAGNOSIS — M5416 Radiculopathy, lumbar region: Secondary | ICD-10-CM | POA: Diagnosis not present

## 2020-06-08 DIAGNOSIS — G8929 Other chronic pain: Secondary | ICD-10-CM | POA: Diagnosis not present

## 2020-06-08 DIAGNOSIS — Z981 Arthrodesis status: Secondary | ICD-10-CM | POA: Insufficient documentation

## 2020-06-08 DIAGNOSIS — M48062 Spinal stenosis, lumbar region with neurogenic claudication: Secondary | ICD-10-CM | POA: Insufficient documentation

## 2020-06-08 MED ORDER — ONDANSETRON HCL 4 MG/2ML IJ SOLN: 4.0000 mg | Freq: Four times a day (QID) | INTRAMUSCULAR | Status: DC | PRN

## 2020-06-08 MED ORDER — HYDROCODONE-ACETAMINOPHEN 5-325 MG PO TABS
1.0000 | ORAL_TABLET | ORAL | Status: DC | PRN
  Administered 2020-06-08: 1 via ORAL

## 2020-06-08 MED ORDER — IOHEXOL 180 MG/ML  SOLN
20.0000 mL | Freq: Once | INTRAMUSCULAR | Status: AC | PRN
  Administered 2020-06-08: 12 mL via INTRATHECAL

## 2020-06-08 MED ORDER — DIAZEPAM 5 MG PO TABS
10.0000 mg | ORAL_TABLET | Freq: Once | ORAL | Status: AC
  Administered 2020-06-08: 10 mg via ORAL
  Filled 2020-06-08: qty 2

## 2020-06-08 MED ORDER — LIDOCAINE HCL (PF) 1 % IJ SOLN
5.0000 mL | Freq: Once | INTRAMUSCULAR | Status: AC
  Administered 2020-06-08: 5 mL via INTRADERMAL

## 2020-06-08 MED ORDER — HYDROCODONE-ACETAMINOPHEN 5-325 MG PO TABS
ORAL_TABLET | ORAL | Status: AC
  Filled 2020-06-08: qty 1

## 2020-06-08 NOTE — Discharge Instructions (Signed)
Myelogram  A myelogram is an imaging test. This test checks for problems in the spinal cord and the places where nerves attach to the spinal cord (nerve roots). A dye (contrast material) is put into your spine before the X-ray. This provides a clearer image for your doctor to see. You may need this test if you have a spinal cord problem that cannot be diagnosed with other imaging tests. You may also have this test to check your spine after surgery. Tell a doctor about:  Any allergies you have, especially to iodine.  All medicines you are taking, including vitamins, herbs, eye drops, creams, and over-the-counter medicines.  Any problems you or family members have had with anesthetic medicines or dye.  Any blood disorders you have.  Any surgeries you have had.  Any medical conditions you have or have had, including asthma.  Whether you are pregnant or may be pregnant. What are the risks? Generally, this is a safe procedure. However, problems may occur, including:  Infection.  Bleeding.  Allergic reaction to medicines or dyes.  Damage to your spinal cord or nerves.  Leaking of spinal fluid. This can cause a headache.  Damage to kidneys.  Seizures. This is rare. What happens before the procedure?  Follow instructions from your doctor about what you cannot eat or drink. You may be asked to drink more fluids.  Ask your doctor about changing or stopping your normal medicines. This is important if you take diabetes medicines or blood thinners.  Plan to have someone take you home from the hospital or clinic.  If you will be going home right after the procedure, plan to have someone with you for 24 hours. What happens during the procedure?  You will lie face down on a table.  Your doctor will find the best injection site on your spine. This is most often in the lower back.  This area will be washed with soap.  You will be given a medicine to numb the area (local  anesthetic).  Your doctor will place a long needle into the space around your spinal cord.  A sample of spinal fluid may be taken. This may be sent to the lab for testing.  The dye will be injected into the space around your spinal cord.  The exam table may be tilted. This helps the dye flow up or down your spine.  The X-ray will take images of your spinal cord.  A bandage (dressing) may be placed over the area where the dye was injected. The procedure may vary among doctors and hospitals. What can I expect after the procedure?  You may be monitored until you leave the hospital or clinic. This includes checking your blood pressure, heart rate, breathing rate, and blood oxygen level.  You may feel sore at the injection site. You may have a mild headache.  You will be told to lie flat with your head raised (elevated). This lowers the risk of a headache.  It is up to you to get the results of your procedure. Ask your doctor, or the department that is doing the procedure, when your results will be ready. Follow these instructions at home:   Rest as told by your doctor. Lie flat with your head slightly elevated.  Do not bend, lift, or do hard work for 24-48 hours, or as told by your doctor.  Take over-the-counter and prescription medicines only as told by your doctor.  Take care of your bandage as told by your  doctor.  Drink enough fluid to keep your pee (urine) pale yellow.  Bathe or shower as told by your doctor. Contact a doctor if:  You have a fever.  You have a headache that lasts longer than 24 hours.  You feel sick to your stomach (nauseous).  You vomit.  Your neck is stiff.  Your legs feel numb.  You cannot pee.  You cannot poop (no bowel movement).  You have a rash.  You are itchy or sneezing. Get help right away if:  You have new symptoms or your symptoms get worse.  You have a seizure.  You have trouble breathing. Summary  A myelogram is an  imaging test that checks for problems in the spinal cord and the places where nerves attach to the spinal cord (nerve roots).  Before the procedure, follow instructions from your doctor. You will be told what not to eat or drink, or what medicines to change or stop.  After the procedure, you will be told to lie flat with your head raised (elevated). This will lower your risk of a headache.  Do not bend, lift, or do any hard work for 24-48 hours, or as told by your doctor.  Contact a doctor if you have a stiff neck or numb legs. Get help right away if your symptoms get worse, or you have a seizure or trouble breathing. This information is not intended to replace advice given to you by your health care provider. Make sure you discuss any questions you have with your health care provider. Document Revised: 09/24/2018 Document Reviewed: 09/25/2018 Elsevier Patient Education  [employer].

## 2020-06-08 NOTE — Procedures (Signed)
Ms. Valisa Karpel is a 79 year old individual whose had significant surgery to decompress and stabilize from L3-L5.  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  Pre op Dx: Lumbar spinal stenosis, history of fusion Post op Dx: Lumbar spinal stenosis, history of fusion Procedure: Lumbar myelogram Surgeon: Koden Hunzeker Puncture level: L3-4 Fluid color: Clear colorless Injection: Isovue 180, 12 mL Findings: Severe stenosis L2-3 L5 nerve root cut off L5-S1.  Further evaluation with CT scanning.

## 2020-06-10 ENCOUNTER — Other Ambulatory Visit: Payer: Self-pay | Admitting: Neurological Surgery

## 2020-06-10 DIAGNOSIS — I10 Essential (primary) hypertension: Secondary | ICD-10-CM | POA: Diagnosis not present

## 2020-06-10 DIAGNOSIS — M48061 Spinal stenosis, lumbar region without neurogenic claudication: Secondary | ICD-10-CM | POA: Diagnosis not present

## 2020-06-16 NOTE — Pre-Procedure Instructions (Signed)
[place], [place] [address] Phone: [phone] Fax: [phone]  [id number] [place], [place] [address] [place] [address] Phone: [phone] Fax: [phone]  [id number] [place], [place] [address] [place] [address] Phone: [phone] Fax: [phone]      Your procedure is scheduled on Tuesday, November 30th at 10:25 a.m.Marland Kitchen  Report to [facility] Main Entrance "A" at 8:25 A.M., and check in at the Admitting office.  Call this number if you have problems the morning of surgery:  [phone]  Call [phone] if you have any questions prior to your surgery date Monday-Friday 8am-4pm    Remember:  Do not eat or drink after midnight the night before your surgery    Take these medicines the morning of surgery with A SIP OF WATER  amLODipine (NORVASC)  metoprolol succinate (TOPROL-XL)  RESTASIS   Follow your surgeon's instructions on when to stop Aspirin.  If no instructions were given by your surgeon then you will need to call the office to get those instructions.    As of today, STOP taking any Aspirin (unless otherwise instructed by your surgeon) Aleve, Naproxen, Ibuprofen, Motrin, Advil, Goody's, BC's, all herbal medications, fish oil, and all vitamins.                     Do not wear jewelry, make up, or nail polish            Do not wear lotions, powders, perfumes, or deodorant.            Do not shave 48 hours prior to surgery.             Do not bring valuables to the hospital.            [facility] is not responsible for any belongings or valuables.  Do NOT Smoke (Tobacco/Vaping) or drink Alcohol 24 hours prior to your procedure If you use a CPAP at night, you may bring all equipment for your overnight stay.   Contacts, glasses, dentures or bridgework may  not be worn into surgery.      For patients admitted to the hospital, discharge time will be determined by your treatment team.   Patients discharged the day of surgery will not be allowed to drive home, and someone needs to stay with them for 24 hours.    Special instructions:   [facility]- Preparing For Surgery  Before surgery, you can play an important role. Because skin is not sterile, your skin needs to be as free of germs as possible. You can reduce the number of germs on your skin by washing with CHG (chlorahexidine gluconate) Soap before surgery.  CHG is an antiseptic cleaner which kills germs and bonds with the skin to continue killing germs even after washing.    Oral Hygiene is also important to reduce your risk of infection.  Remember - BRUSH YOUR TEETH THE MORNING OF SURGERY WITH YOUR REGULAR TOOTHPASTE  Please do not use if you have an allergy to CHG or antibacterial soaps. If your skin becomes reddened/irritated stop using the CHG.  Do not shave (including legs and underarms) for at least 48 hours prior to first CHG shower. It is OK to shave your face.  Please follow these instructions carefully.   1.  Shower the NIGHT BEFORE SURGERY and the MORNING OF SURGERY with CHG Soap.   2. If you chose to wash your hair, wash your hair first as usual with your normal shampoo.  3. After you shampoo, rinse your hair and body thoroughly to remove the shampoo.  4. Use CHG as you would any other liquid soap. You can apply CHG directly to the skin and wash gently with a scrungie or a clean washcloth.   5. Apply the CHG Soap to your body ONLY FROM THE NECK DOWN.  Do not use on open wounds or open sores. Avoid contact with your eyes, ears, mouth and genitals (private parts). Wash Face and genitals (private parts)  with your normal soap.   6. Wash thoroughly, paying special attention to the area where your surgery will be performed.  7. Thoroughly rinse your body with warm water from the  neck down.  8. DO NOT shower/wash with your normal soap after using and rinsing off the CHG Soap.  9. Pat yourself dry with a CLEAN TOWEL.  10. Wear CLEAN PAJAMAS to bed the night before surgery  11. Place CLEAN SHEETS on your bed the night of your first shower and DO NOT SLEEP WITH PETS.   Day of Surgery: Wear Clean/Comfortable clothing the morning of surgery Do not apply any deodorants/lotions.   Remember to brush your teeth WITH YOUR REGULAR TOOTHPASTE.   Please read over the following fact sheets that you were given.

## 2020-06-17 ENCOUNTER — Encounter (HOSPITAL_COMMUNITY): Payer: Self-pay

## 2020-06-17 ENCOUNTER — Other Ambulatory Visit: Payer: Self-pay

## 2020-06-17 ENCOUNTER — Encounter (HOSPITAL_COMMUNITY)
Admission: RE | Admit: 2020-06-17 | Discharge: 2020-06-17 | Disposition: A | Discharge status: Home / Self Care | Payer: Medicare Other | Source: Ambulatory Visit | Attending: Neurological Surgery | Admitting: Neurological Surgery

## 2020-06-17 DIAGNOSIS — Z01812 Encounter for preprocedural laboratory examination: Secondary | ICD-10-CM | POA: Insufficient documentation

## 2020-06-17 LAB — BASIC METABOLIC PANEL
Anion gap: 13 (ref 5–15)
BUN: 19 mg/dL (ref 8–23)
CO2: 27 mmol/L (ref 22–32)
Calcium: 9.6 mg/dL (ref 8.9–10.3)
Chloride: 100 mmol/L (ref 98–111)
Creatinine, Ser: 1.49 mg/dL — ABNORMAL HIGH (ref 0.44–1.00)
GFR, Estimated: 36 mL/min — ABNORMAL LOW (ref >60)
Glucose, Bld: 115 mg/dL — ABNORMAL HIGH (ref 70–99)
Potassium: 4.5 mmol/L (ref 3.5–5.1)
Sodium: 140 mmol/L (ref 135–145)

## 2020-06-17 LAB — CBC
HCT: 44.4 % (ref 36.0–46.0)
Hemoglobin: 14.1 g/dL (ref 12.0–15.0)
MCH: 27.5 pg (ref 26.0–34.0)
MCHC: 31.8 g/dL (ref 30.0–36.0)
MCV: 86.7 fL (ref 80.0–100.0)
Platelets: 124 10*3/uL — ABNORMAL LOW (ref 150–400)
RBC: 5.12 MIL/uL — ABNORMAL HIGH (ref 3.87–5.11)
RDW: 13.2 % (ref 11.5–15.5)
WBC: 5.1 10*3/uL (ref 4.0–10.5)
nRBC: 0 % (ref 0.0–0.2)

## 2020-06-17 LAB — SURGICAL PCR SCREEN
MRSA, PCR: NEGATIVE
Staphylococcus aureus: POSITIVE — AB

## 2020-06-17 NOTE — Pre-Procedure Instructions (Signed)
Your procedure is scheduled on Tuesday, November 30th at 10:25 a.m.Marland Kitchen  Report to [facility] Main Entrance "A" at 8:25 A.M., and check in at the Admitting office.  Call this number if you have problems the morning of surgery:  [phone]  Call [phone] if you have any questions prior to your surgery date Monday-Friday 8am-4pm    Remember:  Do not eat or drink after midnight the night before your surgery    Take these medicines the morning of surgery with A SIP OF WATER  amLODipine (NORVASC)  metoprolol succinate (TOPROL-XL)  RESTASIS   Follow your surgeon's instructions on when to stop Aspirin.  If no instructions were given by your surgeon then you will need to call the office to get those instructions.    As of today, STOP taking any Aleve, Naproxen, Ibuprofen, Motrin, Advil, Goody's, BC's, all herbal medications, fish oil, and all vitamins.                     Do not wear jewelry, make up, or nail polish            Do not wear lotions, powders, perfumes, or deodorant.            Do not shave 48 hours prior to surgery.             Do not bring valuables to the hospital.            [facility] is not responsible for any belongings or valuables.  Do NOT Smoke (Tobacco/Vaping) or drink Alcohol 24 hours prior to your procedure If you use a CPAP at night, you may bring all equipment for your overnight stay.   Contacts, glasses, dentures or bridgework may not be worn into surgery.      For patients admitted to the hospital, discharge time will be determined by your treatment team.   Patients discharged the day of surgery will not be allowed to drive home, and someone needs to stay with them for 24 hours.    Special instructions:   [facility]- Preparing For Surgery  Before surgery, you can play an important role. Because skin is not sterile, your skin needs to be as free of germs as possible. You can reduce the number of germs on your skin by washing with CHG  (chlorahexidine gluconate) Soap before surgery.  CHG is an antiseptic cleaner which kills germs and bonds with the skin to continue killing germs even after washing.    Oral Hygiene is also important to reduce your risk of infection.  Remember - BRUSH YOUR TEETH THE MORNING OF SURGERY WITH YOUR REGULAR TOOTHPASTE  Please do not use if you have an allergy to CHG or antibacterial soaps. If your skin becomes reddened/irritated stop using the CHG.  Do not shave (including legs and underarms) for at least 48 hours prior to first CHG shower. It is OK to shave your face.  Please follow these instructions carefully.   1. Shower the NIGHT BEFORE SURGERY and the MORNING OF SURGERY with CHG Soap.   2. If you chose to wash your hair, wash your hair first as usual with your normal shampoo.  3. After you shampoo, rinse your hair and body thoroughly to remove the shampoo.  4. Use CHG as you would any other liquid soap. You can apply CHG directly to the skin and wash gently with a scrungie or a clean washcloth.   5. Apply the CHG Soap to  your body ONLY FROM THE NECK DOWN.  Do not use on open wounds or open sores. Avoid contact with your eyes, ears, mouth and genitals (private parts). Wash Face and genitals (private parts)  with your normal soap.   6. Wash thoroughly, paying special attention to the area where your surgery will be performed.  7. Thoroughly rinse your body with warm water from the neck down.  8. DO NOT shower/wash with your normal soap after using and rinsing off the CHG Soap.  9. Pat yourself dry with a CLEAN TOWEL.  10. Wear CLEAN PAJAMAS to bed the night before surgery  11. Place CLEAN SHEETS on your bed the night of your first shower and DO NOT SLEEP WITH PETS.   Day of Surgery: Wear Clean/Comfortable clothing the morning of surgery Do not apply any deodorants/lotions.   Remember to brush your teeth WITH YOUR REGULAR TOOTHPASTE.   Please read over the following fact sheets  that you were given.

## 2020-06-17 NOTE — Progress Notes (Signed)
PCP Redmond School, MD Cardiologist - Claiborne Billings, MD   Chest x-ray - n/a EKG - 03/30/20 Stress Test - 07/07/13 ECHO - 04/13/20 Cardiac Cath - denies   Blood Thinner Instructions: n/a Aspirin Instructions: per pt she has not taken ASA since 05/29/20   COVID TEST- 04/25/20  Coronavirus Screening  Have you experienced the following symptoms:  Cough yes/no: No Fever (>100.26F)  yes/no: No Runny nose yes/no: No Sore throat yes/no: No Difficulty breathing/shortness of breath  yes/no: No  Have you or a family member traveled in the last 14 days and where? yes/no: No   If the patient indicates "YES" to the above questions, their PAT will be rescheduled to limit the exposure to others and, the surgeon will be notified. THE PATIENT WILL NEED TO BE ASYMPTOMATIC FOR 14 DAYS.   If the patient is not experiencing any of these symptoms, the PAT nurse will instruct them to NOT bring anyone with them to their appointment since they may have these symptoms or traveled as well.   Please remind your patients and families that hospital visitation restrictions are in effect and the importance of the restrictions.     Anesthesia review: cardiac hx  Patient denies shortness of breath, fever, cough and chest pain at PAT appointment   All instructions explained to the patient, with a verbal understanding of the material. Patient agrees to go over the instructions while at home for a better understanding. Patient also instructed to self quarantine after being tested for COVID-19. The opportunity to ask questions was provided.

## 2020-06-20 NOTE — Progress Notes (Signed)
Anesthesia Chart Review:  Pt has history of non-Hodgkin's lymphoma initially diagnosed in September 2014.  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  CT in May 2021 did not demonstrate any residual evidence for recurrent lymphoma within the chest, abdomen or pelvis. Cardiomyopathy has been followed by cardiologist Dr. Claiborne Billings.  Her most recent echo from 04/13/2020 shows normalization of LV function with only mild grade 1 diastolic dysfunction.  Last seen by Dr. Claiborne Billings 04/15/2020 and was noted she was overall feeling well from cardiac standpoint.  Her main issue is chronic low back pain status post multiple lumbar surgeries.  Dr. Claiborne Billings referred the patient to Dr. Ellene Route for evaluation of lumbar spine.  She was advised to follow-up with Dr. Claiborne Billings in 4 months.  History of stage III CKD.  Preop creatinine 1.49, which appears to be near her baseline.  Preop labs also notable for mildly low platelets at 124, review of history shows similar.Angus Seller of labs unremarkable.  EKG 03/30/2020: Sinus bradycardia.  Rate 58.  Septal infarct, age undetermined.  TTE 04/13/2020: 1. Left ventricular ejection fraction, by estimation, is 55 to 60%. The  left ventricle has normal function. The left ventricle has no regional  wall motion abnormalities. Left ventricular diastolic parameters are  consistent with Grade I diastolic  dysfunction (impaired relaxation). The E/e' is 12.  2. Right ventricular systolic function is normal. The right ventricular  size is normal.  3. The mitral valve is abnormal. Mild mitral valve regurgitation.  4. The aortic valve is tricuspid. Aortic valve regurgitation is not  visualized.  5. The inferior vena cava is normal in size with greater than 50%  respiratory variability, suggesting right atrial pressure of 3 mmHg.   Comparison(s): 09/21/14 EF 35-40%.   Nuclear stress  07/07/2013: Overall Impression:  Low risk stress nuclear study with mild breast attenuation artifact.   Findings are compatible with nonischemic cardiomyopathy(e.g. chemotherapy, HTN, etc), but multivessel CAD with "balanced ischemia" cannot be fully excluded.   Wynonia Musty [facility] Short Stay Center/Anesthesiology Phone [phone] 06/20/2020 11:59 AM

## 2020-06-20 NOTE — Anesthesia Preprocedure Evaluation (Addendum)
Anesthesia Evaluation  Patient identified by MRN, date of birth, ID band Patient awake    Reviewed: Allergy & Precautions, NPO status , Patient's Chart, lab work & pertinent test results  Airway Mallampati: II  TM Distance: >3 FB Neck ROM: Full    Dental no notable dental hx.    Pulmonary neg pulmonary ROS,    Pulmonary exam normal breath sounds clear to auscultation       Cardiovascular hypertension, Normal cardiovascular exam Rhythm:Regular Rate:Normal     Neuro/Psych CVA negative psych ROS   GI/Hepatic Neg liver ROS, GERD  ,  Endo/Other  negative endocrine ROS  Renal/GU negative Renal ROS  negative genitourinary   Musculoskeletal negative musculoskeletal ROS (+)   Abdominal   Peds negative pediatric ROS (+)  Hematology negative hematology ROS (+)   Anesthesia Other Findings   Reproductive/Obstetrics negative OB ROS                            Anesthesia Physical Anesthesia Plan  ASA: II  Anesthesia Plan: General   Post-op Pain Management:    Induction: Intravenous  PONV Risk Score and Plan: 3 and Ondansetron, Dexamethasone and Treatment may vary due to age or medical condition  Airway Management Planned: Oral ETT  Additional Equipment:   Intra-op Plan:   Post-operative Plan: Extubation in OR  Informed Consent: I have reviewed the patients History and Physical, chart, labs and discussed the procedure including the risks, benefits and alternatives for the proposed anesthesia with the patient or authorized representative who has indicated his/her understanding and acceptance.     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  She is status post systemic chemotherapy with CHOP/Rituxan which subsequently was modified  secondary to steroid-induced psychosis as well as cardiomyopathy.  CT in May 2021 did not demonstrate any residual evidence for recurrent lymphoma within the chest, abdomen or pelvis. Cardiomyopathy has been followed by cardiologist Dr. Claiborne Billings.  Her most recent echo from 04/13/2020 shows normalization of LV function with only mild grade 1 diastolic dysfunction.  Last seen by Dr. Claiborne Billings 04/15/2020 and was noted she was overall feeling well from cardiac standpoint.  Her main issue is chronic low back pain status post multiple lumbar surgeries.  Dr. Claiborne Billings referred the patient to Dr. Ellene Route for evaluation of lumbar spine.  She was advised to follow-up with Dr. Claiborne Billings in 4 months.  History of stage III CKD.  Preop creatinine 1.49, which appears to be near her baseline.  Preop labs also notable for mildly low platelets at 124, review of history shows similar.Angus Seller of labs unremarkable.  EKG 03/30/2020: Sinus bradycardia.  Rate 58.  Septal infarct, age undetermined.  TTE 04/13/2020: 1. Left ventricular ejection fraction, by estimation, is 55 to 60%. The  left ventricle has normal function. The left ventricle has no regional  wall motion abnormalities. Left ventricular diastolic parameters are  consistent with Grade I diastolic  dysfunction (impaired relaxation). The E/e' is 12.  2. Right ventricular systolic function is normal. The right ventricular  size is normal.  3. The mitral valve is abnormal. Mild mitral valve regurgitation.  4. The aortic valve is tricuspid. Aortic valve regurgitation is not  visualized.  5. The inferior vena cava is normal in size with greater than 50%  respiratory variability, suggesting right atrial  pressure of 3 mmHg.   Comparison(s): 09/21/14 EF 35-40%.   Nuclear stress 07/07/2013: Overall Impression:  Low risk stress nuclear study with mild breast attenuation artifact.   Findings are compatible with nonischemic cardiomyopathy(e.g. chemotherapy, HTN, etc), but  multivessel CAD with "balanced ischemia" cannot be fully excluded. )       Anesthesia Quick Evaluation

## 2020-06-24 ENCOUNTER — Other Ambulatory Visit: Payer: Self-pay | Admitting: Family Medicine

## 2020-06-24 DIAGNOSIS — M810 Age-related osteoporosis without current pathological fracture: Secondary | ICD-10-CM

## 2020-06-25 ENCOUNTER — Other Ambulatory Visit (HOSPITAL_COMMUNITY)
Admission: RE | Admit: 2020-06-25 | Discharge: 2020-06-25 | Disposition: A | Discharge status: Home / Self Care | Payer: Medicare Other | Source: Ambulatory Visit | Attending: Neurological Surgery | Admitting: Neurological Surgery

## 2020-06-25 DIAGNOSIS — Z01812 Encounter for preprocedural laboratory examination: Secondary | ICD-10-CM | POA: Diagnosis not present

## 2020-06-25 DIAGNOSIS — Z20822 Contact with and (suspected) exposure to covid-19: Secondary | ICD-10-CM | POA: Diagnosis not present

## 2020-06-26 LAB — SARS CORONAVIRUS 2 (TAT 6-24 HRS): SARS Coronavirus 2: NEGATIVE

## 2020-06-28 ENCOUNTER — Inpatient Hospital Stay (HOSPITAL_COMMUNITY): Payer: Medicare Other

## 2020-06-28 ENCOUNTER — Observation Stay (HOSPITAL_COMMUNITY)
Admission: RE | Admit: 2020-06-28 | Discharge: 2020-06-29 | Disposition: A | Discharge status: Home / Self Care | Payer: Medicare Other | Attending: Neurological Surgery | Admitting: Neurological Surgery

## 2020-06-28 ENCOUNTER — Inpatient Hospital Stay (HOSPITAL_COMMUNITY): Payer: Medicare Other | Admitting: Anesthesiology

## 2020-06-28 ENCOUNTER — Inpatient Hospital Stay (HOSPITAL_COMMUNITY): Payer: Medicare Other | Admitting: Physician Assistant

## 2020-06-28 ENCOUNTER — Encounter (HOSPITAL_COMMUNITY): Payer: Self-pay | Admitting: Neurological Surgery

## 2020-06-28 ENCOUNTER — Encounter (HOSPITAL_COMMUNITY)
Admission: RE | Disposition: A | Discharge status: Home / Self Care | Payer: Self-pay | Source: Home / Self Care | Attending: Neurological Surgery

## 2020-06-28 ENCOUNTER — Other Ambulatory Visit: Payer: Self-pay

## 2020-06-28 DIAGNOSIS — Z419 Encounter for procedure for purposes other than remedying health state, unspecified: Secondary | ICD-10-CM

## 2020-06-28 DIAGNOSIS — I10 Essential (primary) hypertension: Secondary | ICD-10-CM | POA: Diagnosis not present

## 2020-06-28 DIAGNOSIS — M4316 Spondylolisthesis, lumbar region: Secondary | ICD-10-CM | POA: Diagnosis not present

## 2020-06-28 DIAGNOSIS — M48062 Spinal stenosis, lumbar region with neurogenic claudication: Principal | ICD-10-CM | POA: Diagnosis present

## 2020-06-28 DIAGNOSIS — Z79899 Other long term (current) drug therapy: Secondary | ICD-10-CM | POA: Insufficient documentation

## 2020-06-28 DIAGNOSIS — E785 Hyperlipidemia, unspecified: Secondary | ICD-10-CM | POA: Diagnosis not present

## 2020-06-28 DIAGNOSIS — Z7982 Long term (current) use of aspirin: Secondary | ICD-10-CM | POA: Diagnosis not present

## 2020-06-28 DIAGNOSIS — D696 Thrombocytopenia, unspecified: Secondary | ICD-10-CM | POA: Diagnosis not present

## 2020-06-28 DIAGNOSIS — M4326 Fusion of spine, lumbar region: Secondary | ICD-10-CM | POA: Diagnosis not present

## 2020-06-28 DIAGNOSIS — M5416 Radiculopathy, lumbar region: Secondary | ICD-10-CM | POA: Insufficient documentation

## 2020-06-28 DIAGNOSIS — Z981 Arthrodesis status: Secondary | ICD-10-CM | POA: Diagnosis not present

## 2020-06-28 DIAGNOSIS — M549 Dorsalgia, unspecified: Secondary | ICD-10-CM | POA: Diagnosis present

## 2020-06-28 HISTORY — PX: ANTERIOR LAT LUMBAR FUSION: SHX1168

## 2020-06-28 SURGERY — ANTERIOR LATERAL LUMBAR FUSION 1 LEVEL
Anesthesia: General

## 2020-06-28 MED ORDER — PHENOL 1.4 % MT LIQD: 1.0000 | OROMUCOSAL | Status: DC | PRN

## 2020-06-28 MED ORDER — OXYCODONE-ACETAMINOPHEN 5-325 MG PO TABS
1.0000 | ORAL_TABLET | ORAL | Status: DC | PRN
  Administered 2020-06-28 – 2020-06-29 (×3): 2 via ORAL
  Filled 2020-06-28 (×3): qty 2

## 2020-06-28 MED ORDER — CHLORHEXIDINE GLUCONATE CLOTH 2 % EX PADS: 6.0000 | MEDICATED_PAD | Freq: Once | CUTANEOUS | Status: DC

## 2020-06-28 MED ORDER — MENTHOL 3 MG MT LOZG: 1.0000 | LOZENGE | OROMUCOSAL | Status: DC | PRN

## 2020-06-28 MED ORDER — LOSARTAN POTASSIUM-HCTZ 100-12.5 MG PO TABS: 1.0000 | ORAL_TABLET | Freq: Every day | ORAL | Status: DC

## 2020-06-28 MED ORDER — PROPOFOL 500 MG/50ML IV EMUL
INTRAVENOUS | Status: DC | PRN
  Administered 2020-06-28: 25 ug/kg/min via INTRAVENOUS

## 2020-06-28 MED ORDER — BUPIVACAINE HCL (PF) 0.5 % IJ SOLN
INTRAMUSCULAR | Status: AC
  Filled 2020-06-28: qty 30

## 2020-06-28 MED ORDER — LIDOCAINE-EPINEPHRINE 1 %-1:100000 IJ SOLN
INTRAMUSCULAR | Status: DC | PRN
  Administered 2020-06-28: 4 mL

## 2020-06-28 MED ORDER — PROPOFOL 10 MG/ML IV BOLUS
INTRAVENOUS | Status: DC | PRN
  Administered 2020-06-28: 40 mg via INTRAVENOUS
  Administered 2020-06-28: 110 mg via INTRAVENOUS

## 2020-06-28 MED ORDER — HYDROCHLOROTHIAZIDE 12.5 MG PO CAPS: 12.5000 mg | ORAL_CAPSULE | Freq: Every day | ORAL | Status: DC

## 2020-06-28 MED ORDER — PHENYLEPHRINE HCL-NACL 10-0.9 MG/250ML-% IV SOLN
INTRAVENOUS | Status: DC | PRN
  Administered 2020-06-28: 20 ug/min via INTRAVENOUS

## 2020-06-28 MED ORDER — METHOCARBAMOL 500 MG PO TABS
500.0000 mg | ORAL_TABLET | Freq: Four times a day (QID) | ORAL | Status: DC | PRN
  Administered 2020-06-28 – 2020-06-29 (×2): 500 mg via ORAL
  Filled 2020-06-28: qty 1

## 2020-06-28 MED ORDER — THROMBIN 5000 UNITS EX SOLR
OROMUCOSAL | Status: DC | PRN
  Administered 2020-06-28: 5 mL via TOPICAL

## 2020-06-28 MED ORDER — SUCCINYLCHOLINE CHLORIDE 200 MG/10ML IV SOSY
PREFILLED_SYRINGE | INTRAVENOUS | Status: DC | PRN
  Administered 2020-06-28: 110 mg via INTRAVENOUS

## 2020-06-28 MED ORDER — CYCLOSPORINE 0.05 % OP EMUL
1.0000 [drp] | Freq: Two times a day (BID) | OPHTHALMIC | Status: DC
  Administered 2020-06-28: 1 [drp] via OPHTHALMIC
  Filled 2020-06-28 (×3): qty 1

## 2020-06-28 MED ORDER — SODIUM CHLORIDE 0.9 % IV SOLN: 250.0000 mL | INTRAVENOUS | Status: DC

## 2020-06-28 MED ORDER — SUCCINYLCHOLINE CHLORIDE 200 MG/10ML IV SOSY
PREFILLED_SYRINGE | INTRAVENOUS | Status: AC
  Filled 2020-06-28: qty 10

## 2020-06-28 MED ORDER — ACETAMINOPHEN 325 MG PO TABS: 650.0000 mg | ORAL_TABLET | ORAL | Status: DC | PRN

## 2020-06-28 MED ORDER — LACTATED RINGERS IV SOLN: INTRAVENOUS | Status: DC

## 2020-06-28 MED ORDER — OXYCODONE HCL 5 MG/5ML PO SOLN: 5.0000 mg | Freq: Once | ORAL | Status: AC | PRN

## 2020-06-28 MED ORDER — VANCOMYCIN HCL IN DEXTROSE 1-5 GM/200ML-% IV SOLN
1000.0000 mg | INTRAVENOUS | Status: AC
  Administered 2020-06-28: 1000 mg via INTRAVENOUS
  Filled 2020-06-28: qty 200

## 2020-06-28 MED ORDER — CHLORHEXIDINE GLUCONATE 0.12 % MT SOLN
15.0000 mL | Freq: Once | OROMUCOSAL | Status: AC
  Administered 2020-06-28: 15 mL via OROMUCOSAL
  Filled 2020-06-28: qty 15

## 2020-06-28 MED ORDER — DOCUSATE SODIUM 100 MG PO CAPS
100.0000 mg | ORAL_CAPSULE | Freq: Two times a day (BID) | ORAL | Status: DC
  Administered 2020-06-28: 100 mg via ORAL
  Filled 2020-06-28: qty 1

## 2020-06-28 MED ORDER — DEXAMETHASONE SODIUM PHOSPHATE 10 MG/ML IJ SOLN
INTRAMUSCULAR | Status: DC | PRN
  Administered 2020-06-28: 10 mg via INTRAVENOUS

## 2020-06-28 MED ORDER — SENNA 8.6 MG PO TABS
1.0000 | ORAL_TABLET | Freq: Two times a day (BID) | ORAL | Status: DC
  Administered 2020-06-28: 8.6 mg via ORAL
  Filled 2020-06-28: qty 1

## 2020-06-28 MED ORDER — LIDOCAINE 2% (20 MG/ML) 5 ML SYRINGE
INTRAMUSCULAR | Status: DC | PRN
  Administered 2020-06-28: 100 mg via INTRAVENOUS

## 2020-06-28 MED ORDER — FENTANYL CITRATE (PF) 250 MCG/5ML IJ SOLN
INTRAMUSCULAR | Status: AC
  Filled 2020-06-28: qty 5

## 2020-06-28 MED ORDER — LOSARTAN POTASSIUM 50 MG PO TABS: 100.0000 mg | ORAL_TABLET | Freq: Every day | ORAL | Status: DC

## 2020-06-28 MED ORDER — ONDANSETRON HCL 4 MG/2ML IJ SOLN: 4.0000 mg | Freq: Four times a day (QID) | INTRAMUSCULAR | Status: DC | PRN

## 2020-06-28 MED ORDER — FLEET ENEMA 7-19 GM/118ML RE ENEM: 1.0000 | ENEMA | Freq: Once | RECTAL | Status: DC | PRN

## 2020-06-28 MED ORDER — MORPHINE SULFATE (PF) 2 MG/ML IV SOLN
2.0000 mg | INTRAVENOUS | Status: DC | PRN
  Administered 2020-06-28: 4 mg via INTRAVENOUS
  Filled 2020-06-28: qty 2

## 2020-06-28 MED ORDER — HYDROMORPHONE HCL 1 MG/ML IJ SOLN
INTRAMUSCULAR | Status: AC
  Administered 2020-06-28: 0.5 mg via INTRAVENOUS
  Filled 2020-06-28: qty 1

## 2020-06-28 MED ORDER — ACETAMINOPHEN 650 MG RE SUPP: 650.0000 mg | RECTAL | Status: DC | PRN

## 2020-06-28 MED ORDER — ONDANSETRON HCL 4 MG/2ML IJ SOLN
INTRAMUSCULAR | Status: AC
  Filled 2020-06-28: qty 2

## 2020-06-28 MED ORDER — HYDROMORPHONE HCL 1 MG/ML IJ SOLN
0.2500 mg | INTRAMUSCULAR | Status: DC | PRN
  Administered 2020-06-28 (×2): 0.25 mg via INTRAVENOUS
  Administered 2020-06-28: 0.5 mg via INTRAVENOUS

## 2020-06-28 MED ORDER — CEFAZOLIN SODIUM-DEXTROSE 2-4 GM/100ML-% IV SOLN
2.0000 g | Freq: Three times a day (TID) | INTRAVENOUS | Status: AC
  Administered 2020-06-28 – 2020-06-29 (×2): 2 g via INTRAVENOUS
  Filled 2020-06-28 (×2): qty 100

## 2020-06-28 MED ORDER — SODIUM CHLORIDE 0.9% FLUSH: 3.0000 mL | INTRAVENOUS | Status: DC | PRN

## 2020-06-28 MED ORDER — 0.9 % SODIUM CHLORIDE (POUR BTL) OPTIME
TOPICAL | Status: DC | PRN
  Administered 2020-06-28: 1000 mL

## 2020-06-28 MED ORDER — ORAL CARE MOUTH RINSE: 15.0000 mL | Freq: Once | OROMUCOSAL | Status: AC

## 2020-06-28 MED ORDER — EPHEDRINE 5 MG/ML INJ
INTRAVENOUS | Status: AC
  Filled 2020-06-28: qty 10

## 2020-06-28 MED ORDER — LIDOCAINE-EPINEPHRINE 1 %-1:100000 IJ SOLN
INTRAMUSCULAR | Status: AC
  Filled 2020-06-28: qty 1

## 2020-06-28 MED ORDER — BUPIVACAINE HCL (PF) 0.5 % IJ SOLN
INTRAMUSCULAR | Status: DC | PRN
  Administered 2020-06-28: 4 mL

## 2020-06-28 MED ORDER — EPHEDRINE SULFATE-NACL 50-0.9 MG/10ML-% IV SOSY
PREFILLED_SYRINGE | INTRAVENOUS | Status: DC | PRN
  Administered 2020-06-28: 10 mg via INTRAVENOUS

## 2020-06-28 MED ORDER — LIDOCAINE HCL (PF) 2 % IJ SOLN
INTRAMUSCULAR | Status: AC
  Filled 2020-06-28: qty 5

## 2020-06-28 MED ORDER — OXYCODONE HCL 5 MG PO TABS
ORAL_TABLET | ORAL | Status: AC
  Filled 2020-06-28: qty 1

## 2020-06-28 MED ORDER — OXYCODONE HCL 5 MG PO TABS
5.0000 mg | ORAL_TABLET | Freq: Once | ORAL | Status: AC | PRN
  Administered 2020-06-28: 5 mg via ORAL

## 2020-06-28 MED ORDER — METHOCARBAMOL 500 MG PO TABS
ORAL_TABLET | ORAL | Status: AC
  Filled 2020-06-28: qty 1

## 2020-06-28 MED ORDER — SODIUM CHLORIDE 0.9% FLUSH
3.0000 mL | Freq: Two times a day (BID) | INTRAVENOUS | Status: DC
  Administered 2020-06-28: 3 mL via INTRAVENOUS

## 2020-06-28 MED ORDER — DIAZEPAM 5 MG/ML IJ SOLN
INTRAMUSCULAR | Status: AC
  Administered 2020-06-28: 2.5 mg via INTRAVENOUS
  Filled 2020-06-28: qty 2

## 2020-06-28 MED ORDER — DIAZEPAM 5 MG/ML IJ SOLN: 5.0000 mg | Freq: Once | INTRAMUSCULAR | Status: AC

## 2020-06-28 MED ORDER — AMLODIPINE BESYLATE 5 MG PO TABS: 2.5000 mg | ORAL_TABLET | Freq: Every day | ORAL | Status: DC

## 2020-06-28 MED ORDER — DEXAMETHASONE SODIUM PHOSPHATE 10 MG/ML IJ SOLN
INTRAMUSCULAR | Status: AC
  Filled 2020-06-28: qty 1

## 2020-06-28 MED ORDER — POLYETHYLENE GLYCOL 3350 17 G PO PACK: 17.0000 g | PACK | Freq: Every day | ORAL | Status: DC | PRN

## 2020-06-28 MED ORDER — MELATONIN 3 MG PO TABS: 3.0000 mg | ORAL_TABLET | Freq: Every evening | ORAL | Status: DC | PRN

## 2020-06-28 MED ORDER — METHOCARBAMOL 1000 MG/10ML IJ SOLN
500.0000 mg | Freq: Four times a day (QID) | INTRAVENOUS | Status: DC | PRN
  Filled 2020-06-28: qty 5

## 2020-06-28 MED ORDER — ACETAMINOPHEN 10 MG/ML IV SOLN
INTRAVENOUS | Status: AC
  Administered 2020-06-28: 1000 mg via INTRAVENOUS
  Filled 2020-06-28: qty 100

## 2020-06-28 MED ORDER — ACETAMINOPHEN 10 MG/ML IV SOLN: 1000.0000 mg | Freq: Four times a day (QID) | INTRAVENOUS | Status: DC

## 2020-06-28 MED ORDER — PROPOFOL 10 MG/ML IV BOLUS
INTRAVENOUS | Status: AC
  Filled 2020-06-28: qty 20

## 2020-06-28 MED ORDER — BISACODYL 10 MG RE SUPP: 10.0000 mg | Freq: Every day | RECTAL | Status: DC | PRN

## 2020-06-28 MED ORDER — METOPROLOL SUCCINATE ER 100 MG PO TB24: 100.0000 mg | ORAL_TABLET | Freq: Every day | ORAL | Status: DC

## 2020-06-28 MED ORDER — FENTANYL CITRATE (PF) 250 MCG/5ML IJ SOLN
INTRAMUSCULAR | Status: DC | PRN
  Administered 2020-06-28 (×5): 50 ug via INTRAVENOUS

## 2020-06-28 MED ORDER — ONDANSETRON HCL 4 MG PO TABS: 4.0000 mg | ORAL_TABLET | Freq: Four times a day (QID) | ORAL | Status: DC | PRN

## 2020-06-28 MED ORDER — ONDANSETRON HCL 4 MG/2ML IJ SOLN
INTRAMUSCULAR | Status: DC | PRN
  Administered 2020-06-28: 4 mg via INTRAVENOUS

## 2020-06-28 SURGICAL SUPPLY — 51 items
ADH SKN CLS APL DERMABOND .7 (GAUZE/BANDAGES/DRESSINGS) ×1
BLADE CLIPPER SURG (BLADE) IMPLANT
BOLT 5.0X50 SPINAL (Bolt) ×4 IMPLANT
BOLT 6.5X50 SPINAL (Bolt) IMPLANT
BOLT XLIF ST DECADE 5.5X50 (Bolt) IMPLANT
BONE MATRIX OSTEOCEL PRO LRG (Bone Implant) ×2 IMPLANT
COVER WAND RF STERILE (DRAPES) ×3 IMPLANT
DERMABOND ADVANCED (GAUZE/BANDAGES/DRESSINGS) ×2
DERMABOND ADVANCED .7 DNX12 (GAUZE/BANDAGES/DRESSINGS) ×1 IMPLANT
DRAPE C-ARM 42X72 X-RAY (DRAPES) ×3 IMPLANT
DRAPE C-ARMOR (DRAPES) ×3 IMPLANT
DRAPE LAPAROTOMY 100X72X124 (DRAPES) ×3 IMPLANT
DURAPREP 26ML APPLICATOR (WOUND CARE) ×3 IMPLANT
ELECT REM PT RETURN 9FT ADLT (ELECTROSURGICAL) ×3
ELECTRODE REM PT RTRN 9FT ADLT (ELECTROSURGICAL) ×1 IMPLANT
GAUZE 4X4 16PLY RFD (DISPOSABLE) IMPLANT
GLOVE BIO SURGEON STRL SZ 6.5 (GLOVE) ×2 IMPLANT
GLOVE BIO SURGEONS STRL SZ 6.5 (GLOVE) ×1
GLOVE BIOGEL PI IND STRL 6.5 (GLOVE) ×1 IMPLANT
GLOVE BIOGEL PI IND STRL 8.5 (GLOVE) ×1 IMPLANT
GLOVE BIOGEL PI INDICATOR 6.5 (GLOVE) ×2
GLOVE BIOGEL PI INDICATOR 8.5 (GLOVE) ×2
GLOVE ECLIPSE 8.5 STRL (GLOVE) ×3 IMPLANT
GLOVE EXAM NITRILE XL STR (GLOVE) IMPLANT
GOWN STRL REUS W/ TWL LRG LVL3 (GOWN DISPOSABLE) IMPLANT
GOWN STRL REUS W/ TWL XL LVL3 (GOWN DISPOSABLE) ×1 IMPLANT
GOWN STRL REUS W/TWL 2XL LVL3 (GOWN DISPOSABLE) ×3 IMPLANT
GOWN STRL REUS W/TWL LRG LVL3 (GOWN DISPOSABLE)
GOWN STRL REUS W/TWL XL LVL3 (GOWN DISPOSABLE) ×3
HEMOSTAT POWDER KIT SURGIFOAM (HEMOSTASIS) IMPLANT
KIT BASIN OR (CUSTOM PROCEDURE TRAY) ×3 IMPLANT
KIT DILATOR XLIF 5 (KITS) IMPLANT
KIT SURGICAL ACCESS MAXCESS 4 (KITS) ×2 IMPLANT
KIT TURNOVER KIT B (KITS) ×3 IMPLANT
KIT XLIF (KITS) ×2
MODULE NVM5 NEXT GEN EMG (NEEDLE) ×2 IMPLANT
MODULUS XLW 10X22X50MM 10DEG (Spine Construct) ×2 IMPLANT
NDL HYPO 25X1 1.5 SAFETY (NEEDLE) ×1 IMPLANT
NEEDLE HYPO 25X1 1.5 SAFETY (NEEDLE) ×3 IMPLANT
NS IRRIG 1000ML POUR BTL (IV SOLUTION) ×3 IMPLANT
PACK LAMINECTOMY NEURO (CUSTOM PROCEDURE TRAY) ×3 IMPLANT
PLATE 4H DECADE SPINAL (Plate) ×2 IMPLANT
SCREW DECADE 5.5X50 (Screw) ×4 IMPLANT
SPONGE LAP 4X18 RFD (DISPOSABLE) IMPLANT
SUT VIC AB 3-0 SH 8-18 (SUTURE) ×3 IMPLANT
SUT VIC AB 4-0 RB1 18 (SUTURE) ×3 IMPLANT
TAPE CLOTH 4X10 WHT NS (GAUZE/BANDAGES/DRESSINGS) ×6 IMPLANT
TOWEL GREEN STERILE (TOWEL DISPOSABLE) ×3 IMPLANT
TOWEL GREEN STERILE FF (TOWEL DISPOSABLE) ×3 IMPLANT
TRAY FOLEY MTR SLVR 16FR STAT (SET/KITS/TRAYS/PACK) ×3 IMPLANT
WATER STERILE IRR 1000ML POUR (IV SOLUTION) ×3 IMPLANT

## 2020-06-28 NOTE — Anesthesia Postprocedure Evaluation (Signed)
Anesthesia Post Note  Patient: Tonya Wise  Procedure(s) Performed: Lumbar two-three Anterolateral lumbar interbody fusion with lateral plate (N/A )     Patient location during evaluation: PACU Anesthesia Type: General Level of consciousness: awake and alert Pain management: pain level controlled Vital Signs Assessment: post-procedure vital signs reviewed and stable Respiratory status: spontaneous breathing, nonlabored ventilation, respiratory function stable and patient connected to nasal cannula oxygen Cardiovascular status: blood pressure returned to baseline and stable Postop Assessment: no apparent nausea or vomiting Anesthetic complications: no   No complications documented.  Last Vitals:  Vitals:   06/28/20 0827 06/28/20 1252  BP: (!) 143/71 (!) 158/82  Pulse: 64 75  Resp: 20 16  Temp: 36.5 C (!) 36.2 C  SpO2: 96% 99%    Last Pain:  Vitals:   06/28/20 1252  TempSrc:   PainSc: Asleep                 Yaman Grauberger S

## 2020-06-28 NOTE — H&P (Signed)
Tonya Wise is an 79 y.o. female.   Chief Complaint: Back and bilateral leg pain history of fusion L3-L5 HPI: Tonya Wise is a 79 year old individuals had previous decompressive surgery at L3-4 and L4-5. She has been managed in a pain management center in [employer] for a number of years but has had progressively worsening pain she was sent via the courtesy of Dr. Shelva Majestic her cardiologist who noted that the patient had persistent hypertension and complained of significant back pain a myelogram was performed which demonstrates a high-grade stenosis at L2-L3 with retrolisthesis at that level patient also has some degenerative changes at L5-S1 but is felt that the stenosis at L2-L3 could indeed be imparting a significant painful syndrome to her back in her lower extremities. She is advised regarding surgical decompression arthrodesis and this is now being performed.  Past Medical History:  Diagnosis Date  . Allergy   . Anemia   . Ankle fracture, left   . Chemotherapy induced cardiomyopathy ([place])    a. 08/2014: echo showing EF of 35-40% with Grade 2 DD and mild MR  . Diastolic dysfunction, grade I 04/12/2013  . GERD (gastroesophageal reflux disease)    Tums or Maalox  . Heart murmur   . Hypercalcemia 03/27/2013  . Hypertension   . Non-Hodgkins lymphoma ([place]) 04/06/2013  . Osteopenia   . Renal insufficiency   . Stroke [facility]) 2015   per pt while she was in hospital for Nonhodgkins Lymphoma    Past Surgical History:  Procedure Laterality Date  . ABDOMINAL HYSTERECTOMY    . APPENDECTOMY    . BACK SURGERY     LOWER BACK TWICE  . COLONOSCOPY  2008  . ESOPHAGOGASTRODUODENOSCOPY N/A 03/29/2013   Procedure: ESOPHAGOGASTRODUODENOSCOPY (EGD);  Surgeon: Juanita Craver, MD;  Location: [facility] ENDOSCOPY;  Service: Endoscopy;  Laterality: N/A;  . INGUINAL LYMPH NODE BIOPSY Right 03/31/2013   Procedure: INGUINAL LYMPH NODE BIOPSY;  Surgeon: Gwenyth Ober, MD;  Location: [facility];  Service: General;   Laterality: Right;  . ORIF ANKLE FRACTURE Left 11/03/2015   Procedure: OPEN REDUCTION INTERNAL FIXATION (ORIF) LEFT ANKLE FRACTURE;  Surgeon: Wylene Simmer, MD;  Location: [facility];  Service: Orthopedics;  Laterality: Left;  . SPINE SURGERY      Family History  Problem Relation Age of Onset  . Heart attack Father 31  . Leukemia Mother   . Diabetes Brother   . Diabetes Sister    Social History:  reports that she has never smoked. She has never used smokeless tobacco. She reports that she does not drink alcohol and does not use drugs.  Allergies:  Allergies  Allergen Reactions  . Prednisone Other (See Comments)    "like being in a coma"  . Penicillins Itching, Rash and Other (See Comments)    "burning sensation" Has patient had a PCN reaction causing immediate rash, facial/tongue/throat swelling, SOB or lightheadedness with hypotension: No Has patient had a PCN reaction causing severe rash involving mucus membranes or skin necrosis: No Has patient had a PCN reaction that required hospitalization No Has patient had a PCN reaction occurring within the last 10 years: No If all of the above answers are "NO", then may proceed with Cephalosporin use.    Medications Prior to Admission  Medication Sig Dispense Refill  . alendronate (FOSAMAX) 70 MG tablet TAKE 1 TABLET BY MOUTH EVERY 7 DAYS. TAKE WITH A FULL GLASS OF WATER ON AN EMPTY STOMACH 12 tablet 0  . amLODipine (NORVASC) 2.5  MG tablet TAKE 1 TABLET BY MOUTH EVERY DAY (Patient taking differently: Take 2.5 mg by mouth daily. ) 90 tablet 1  . aspirin EC 81 MG tablet Take 1 tablet (81 mg total) by mouth 2 (two) times daily. (Patient taking differently: Take 81 mg by mouth daily. ) 90 tablet 0  . Aspirin-Caffeine (ANACIN) 400-32 MG TABS Take 2 tablets by mouth daily as needed (pain).    Marland Kitchen BIOTIN PO Take 2 tablets by mouth daily.    . Cholecalciferol (VITAMIN D3) 250 MCG (10000 UT) TABS Take 10,000 Units by mouth daily.    Marland Kitchen  losartan-hydrochlorothiazide (HYZAAR) 100-12.5 MG tablet TAKE 1 TABLET BY MOUTH EVERY DAY (Patient taking differently: Take 1 tablet by mouth daily. ) 90 tablet 0  . Melatonin 3 MG CAPS Take 3 mg by mouth at bedtime as needed (sleep).    . metoprolol succinate (TOPROL-XL) 100 MG 24 hr tablet Take 1 tablet (100 mg total) by mouth daily. Take with or immediately following a meal. 90 tablet 3  . Multiple Vitamin (MULTIVITAMIN WITH MINERALS) TABS tablet Take 1 tablet by mouth daily.    . RESTASIS 0.05 % ophthalmic emulsion Place 1 drop into both eyes 2 (two) times daily.       No results found for this or any previous visit (from the past 48 hour(s)). No results found.  Review of Systems  Constitutional: Positive for activity change.  HENT: Negative.   Eyes: Negative.   Respiratory: Negative.   Cardiovascular: Negative.   Gastrointestinal: Positive for abdominal distention.  Endocrine: Negative.   Genitourinary: Negative.   Musculoskeletal: Positive for back pain and myalgias.  Allergic/Immunologic: Negative.   Neurological: Positive for weakness and numbness.  Hematological: Negative.     Blood pressure (!) 143/71, pulse 64, temperature 97.7 F (36.5 C), temperature source Oral, resp. rate 20, height 5\' 3"  (1.6 m), weight 64.4 kg, SpO2 96 %. Physical Exam Constitutional:      Appearance: Normal appearance.  HENT:     Head: Normocephalic and atraumatic.     Nose: Nose normal.     Mouth/Throat:     Mouth: Mucous membranes are dry.  Eyes:     Extraocular Movements: Extraocular movements intact.     Pupils: Pupils are equal, round, and reactive to light.  Cardiovascular:     Rate and Rhythm: Normal rate and regular rhythm.     Pulses: Normal pulses.     Heart sounds: Normal heart sounds.  Pulmonary:     Effort: Pulmonary effort is normal.     Breath sounds: Normal breath sounds.  Abdominal:     General: Abdomen is flat.     Palpations: Abdomen is soft.  Musculoskeletal:      Cervical back: Normal range of motion and neck supple.     Comments: Negative straight leg raising to 60 degrees Patrick's maneuver is negative bilaterally no tenderness to palpation or percussion lumbar spine  Skin:    General: Skin is warm and dry.     Capillary Refill: Capillary refill takes less than 2 seconds.  Neurological:     Mental Status: She is alert.     Comments: Alert and oriented cranial nerve examination is normal Station and gait are intact motor strength is good in iliopsoas quad tibialis anterior and gastrocs she has a trace Achilles reflex on the right but no other reflexes in the patella and the Achilles on the left upper extremity reflexes are 1+.  Psychiatric:  Mood and Affect: Mood normal.        Behavior: Behavior normal.        Thought Content: Thought content normal.        Judgment: Judgment normal.      Assessment/Plan Spondylosis and stenosis with retrolisthesis L2-L3. History of fusion L3-L5. Neurogenic claudication, lumbar radiculopathy.  Plan: Anterolateral decompression L2-L3 lateral plate fixation.  Earleen Newport, MD 06/28/2020, 10:06 AM

## 2020-06-28 NOTE — Transfer of Care (Signed)
Immediate Anesthesia Transfer of Care Note  Patient: Tonya Wise  Procedure(s) Performed: Lumbar two-three Anterolateral lumbar interbody fusion with lateral plate (N/A )  Patient Location: PACU  Anesthesia Type:General  Level of Consciousness: drowsy  Airway & Oxygen Therapy: Patient Spontanous Breathing and Patient connected to face mask oxygen  Post-op Assessment: Report given to RN and Post -op Vital signs reviewed and stable  Post vital signs: Reviewed and stable  Last Vitals:  Vitals Value Taken Time  BP 158/82 06/28/20 1253  Temp 36.2 C 06/28/20 1252  Pulse 67 06/28/20 1258  Resp 11 06/28/20 1258  SpO2 96 % 06/28/20 1258  Vitals shown include unvalidated device data.  Last Pain:  Vitals:   06/28/20 1252  TempSrc:   PainSc: Asleep         Complications: No complications documented.

## 2020-06-28 NOTE — Progress Notes (Signed)
Orthopedic Tech Progress Note Patient Details:  Tonya Wise Nov 08, 1940 [id number] Stop by and saw patient about brace. She said she didn't have one so I would order one from [facility] and have them drop it off to PACU once patient is out of surgery  Patient ID: Tonya Wise, female   DOB: [birth year], 79 y.o.   MRN: [id number]   [name] 06/28/2020, 10:43 AM

## 2020-06-28 NOTE — Op Note (Signed)
Date of surgery: 11 32,021 Preoperative diagnosis: Lumbar spinal stenosis L2-L3 status post decompression and fusion L3-L5 neurogenic claudication.  Lumbar radiculopathy.  Retrolisthesis L2-L3 Postoperative diagnosis: Same Procedure: Anterolateral decompression L2-L3 arthrodesis with XLIF spacer and allograft (osteocell) lateral plate fixation Y7-C6.  EMG neuro monitoring, fluoroscopic image guidance. Surgeon: Kristeen Miss First Assistant: Elwin Sleight, DO Anesthesia: General endotracheal Indication Tonya Wise is a 79 year old individual who has had significant back and bilateral lower extremity pain over the past few years she has had progressively worsening hypertension is felt to be due to in part contributed to her back pain.  She has been advised regarding the need for surgical decompression stabilization at the level of L2-L3.  Procedure: Patient was brought to the operating room supine on stretcher.  After the smooth induction of general tracheal anesthesia she was carefully placed in the right lateral decubitus position.  Fluoroscopic imaging was used to place her orthogonally and when this was verified she was taped into position.  EMG monitoring electrodes had been placed on the major groups of the lumbar musculature to verify that none of the elements of the lumbar plexus would be adversely affected.  The entry site was chosen fluoroscopically and the skin was prepped with alcohol DuraPrep and draped in a sterile fashion.  After infiltrating with local anesthetic of the incision was made and carried down through the superficial fascia.  The plane between the ribs of 10 and 11 was then dissected bluntly and a probe was passed down to the lateral aspect of the vertebral body at the L2-L3 level.  The disc space was marked and then secured with a K wire.  A series of dilators was passed over this initial probe and over the initial probe we performed EMG monitoring to make sure no elements of the  lumbar plexus were being irritated this was done similarly with all the dilators and ultimately with the retractor that was placed.  Once the retractor was placed it was secured to the operating table with a clamp we then stimulated posteriorly in the depth of the wound to make sure no elements of the lumbar plexus were nearby so that we could place a shim into the disc space at the L2-L3 space.  The shim was then placed under direct visualization and fluoroscopic imaging.  The interspace distracted considerably from placement of the shim.  The lateral aspect of the ligament was then opened over the disc space at L2-L3 and only a modest quantity of the degenerated disc was removed from this area a series of dilators was then passed through the disc space and the contralateral ligament was opened with a Cobb elevators.  A series of dilators was then passed to the 18 x 22 mm size and initial radiographs demonstrated good distraction of the interspace and likely probable reduction of the retrolisthesis.  Further dilation of the interspace was performed to the 10 mm height 22 mm anterior posterior dimension with a slight lordosis this was ultimately the spacer that was chosen measuring 50 mm in width.  The endplates were curettaged multiple times using a combination of round curettes triangular curettes to remove substantial endplate cartilage once all the cartilage was removed and the endplates were secured the spacer was tapped into position after filling the opening with a few cc of ostia cell and placing ostia cell into the titanium cage and then packing it into the interspace once the cage was packed hemostasis was checked in the soft tissues and a  10 mm lateral plate was secured with 5.5 x 50 mm screws both anteriorly and posteriorly.  Final radiographs were obtained and then the retractor was released and removed.  Hemostasis in the soft tissue was checked and then the posterior fascia was closed with 3-0 Vicryl  in interrupted fashion 3-0 Vicryl was used in the subcuticular tissues along with some 4-0 Vicryl for final subcuticular closure Dermabond was placed on the skin and blood loss was estimated 50 cc.  No abnormal stimulation of the lumbar plexus was obtained during this procedure.

## 2020-06-28 NOTE — Anesthesia Procedure Notes (Signed)
Procedure Name: Intubation Date/Time: 06/28/2020 10:53 AM Performed by: Reece Agar, CRNA Pre-anesthesia Checklist: Patient identified, Emergency Drugs available, Suction available and Patient being monitored Patient Re-evaluated:Patient Re-evaluated prior to induction Oxygen Delivery Method: Circle System Utilized Preoxygenation: Pre-oxygenation with 100% oxygen Induction Type: IV induction Ventilation: Mask ventilation without difficulty Laryngoscope Size: Mac and 3 Grade View: Grade I Tube type: Oral Tube size: 7.0 mm Number of attempts: 1 Airway Equipment and Method: Stylet Placement Confirmation: ETT inserted through vocal cords under direct vision,  positive ETCO2 and breath sounds checked- equal and bilateral Secured at: 21 cm Tube secured with: Tape Dental Injury: Teeth and Oropharynx as per pre-operative assessment

## 2020-06-29 ENCOUNTER — Encounter (HOSPITAL_COMMUNITY): Payer: Self-pay | Admitting: Neurological Surgery

## 2020-06-29 DIAGNOSIS — I10 Essential (primary) hypertension: Secondary | ICD-10-CM | POA: Diagnosis not present

## 2020-06-29 DIAGNOSIS — M48062 Spinal stenosis, lumbar region with neurogenic claudication: Secondary | ICD-10-CM | POA: Diagnosis not present

## 2020-06-29 DIAGNOSIS — Z7982 Long term (current) use of aspirin: Secondary | ICD-10-CM | POA: Diagnosis not present

## 2020-06-29 DIAGNOSIS — Z79899 Other long term (current) drug therapy: Secondary | ICD-10-CM | POA: Diagnosis not present

## 2020-06-29 DIAGNOSIS — M5416 Radiculopathy, lumbar region: Secondary | ICD-10-CM | POA: Diagnosis not present

## 2020-06-29 MED ORDER — METHOCARBAMOL 500 MG PO TABS
500.0000 mg | ORAL_TABLET | Freq: Four times a day (QID) | ORAL | 3 refills | Status: DC | PRN
Start: 2020-06-29 — End: 2020-12-12

## 2020-06-29 MED ORDER — OXYCODONE-ACETAMINOPHEN 5-325 MG PO TABS
1.0000 | ORAL_TABLET | ORAL | 0 refills | Status: DC | PRN
Start: 2020-06-29 — End: 2020-08-24

## 2020-06-29 NOTE — Discharge Summary (Signed)
Physician Discharge Summary  Patient ID: Tonya Wise MRN: [id number] DOB/AGE: 09-28-1940 79 y.o.  Admit date: 06/28/2020 Discharge date: 06/29/2020  Admission Diagnoses: Retrolisthesis of L2 on L3 with spinal stenosis neurogenic claudication, lumbar radiculopathy.  History of fusion L3-L5.  Discharge Diagnoses: Retrolisthesis of L2 and L3 with spinal stenosis.  Neurogenic claudication.  Lumbar radiculopathy.  History of fusion L3-L5. Active Problems:   Lumbar stenosis with neurogenic claudication   Discharged Condition: good  Hospital Course: Patient was admitted to undergo surgical decompression and stabilization of L2-L3 which he tolerated well  Consults: None  Significant Diagnostic Studies: None  Treatments: surgery: Anterolateral decompression L2-L3 arthrodesis using allograft structural fixation and lateral plate  Discharge Exam: Blood pressure 116/71, pulse (!) 58, temperature 98.2 F (36.8 C), temperature source Oral, resp. rate 18, height 5\' 3"  (1.6 m), weight 64.4 kg, SpO2 95 %. Incision is clean and dry motor function is intact Station and gait are intact  Disposition: Discharge disposition: 01-Home or Self Care       Discharge Instructions    Call MD for:  redness, tenderness, or signs of infection (pain, swelling, redness, odor or green/yellow discharge around incision site)   Complete by: As directed    Call MD for:  severe uncontrolled pain   Complete by: As directed    Call MD for:  temperature >100.4   Complete by: As directed    Diet - low sodium heart healthy   Complete by: As directed    Diet general   Complete by: As directed    Discharge wound care:   Complete by: As directed    Okay to shower. Do not apply salves or appointments to incision. No heavy lifting with the upper extremities greater than 15 pounds. May resume driving when not requiring pain medication and patient feels comfortable with doing so.   Incentive spirometry RT   Complete  by: As directed    Increase activity slowly   Complete by: As directed      Allergies as of 06/29/2020      Reactions   Prednisone Other (See Comments)   "like being in a coma"   Penicillins Itching, Rash, Other (See Comments)   "burning sensation" Has patient had a PCN reaction causing immediate rash, facial/tongue/throat swelling, SOB or lightheadedness with hypotension: No Has patient had a PCN reaction causing severe rash involving mucus membranes or skin necrosis: No Has patient had a PCN reaction that required hospitalization No Has patient had a PCN reaction occurring within the last 10 years: No If all of the above answers are "NO", then may proceed with Cephalosporin use.      Medication List    TAKE these medications   alendronate 70 MG tablet Commonly known as: FOSAMAX TAKE 1 TABLET BY MOUTH EVERY 7 DAYS. TAKE WITH A FULL GLASS OF WATER ON AN EMPTY STOMACH   amLODipine 2.5 MG tablet Commonly known as: NORVASC TAKE 1 TABLET BY MOUTH EVERY DAY   Anacin 400-32 MG Tabs Generic drug: Aspirin-Caffeine Take 2 tablets by mouth daily as needed (pain).   aspirin EC 81 MG tablet Take 1 tablet (81 mg total) by mouth 2 (two) times daily. What changed: when to take this   BIOTIN PO Take 2 tablets by mouth daily.   losartan-hydrochlorothiazide 100-12.5 MG tablet Commonly known as: HYZAAR TAKE 1 TABLET BY MOUTH EVERY DAY   Melatonin 3 MG Caps Take 3 mg by mouth at bedtime as needed (sleep).   methocarbamol 500  MG tablet Commonly known as: ROBAXIN Take 1 tablet (500 mg total) by mouth every 6 (six) hours as needed for muscle spasms.   metoprolol succinate 100 MG 24 hr tablet Commonly known as: TOPROL-XL Take 1 tablet (100 mg total) by mouth daily. Take with or immediately following a meal.   multivitamin with minerals Tabs tablet Take 1 tablet by mouth daily.   oxyCODONE-acetaminophen 5-325 MG tablet Commonly known as: PERCOCET/ROXICET Take 1-2 tablets by mouth  every 3 (three) hours as needed for moderate pain or severe pain.   Restasis 0.05 % ophthalmic emulsion Generic drug: cycloSPORINE Place 1 drop into both eyes 2 (two) times daily.   Vitamin D3 250 MCG (10000 UT) Tabs Take 10,000 Units by mouth daily.            Discharge Care Instructions  (From admission, onward)         Start     Ordered   06/29/20 0000  Discharge wound care:       Comments: Okay to shower. Do not apply salves or appointments to incision. No heavy lifting with the upper extremities greater than 15 pounds. May resume driving when not requiring pain medication and patient feels comfortable with doing so.   06/29/20 0928           Signed: Blanchie Dessert Jennae Hakeem 06/29/2020, 9:29 AM

## 2020-06-29 NOTE — Care Management CC44 (Signed)
Condition Code 44 Documentation Completed  Patient Details  Name: CHERELLE MIDKIFF MRN: [id number] Date of Birth: [birth year]   Condition Code 44 given:  Yes Patient signature on Condition Code 44 notice:  Yes Documentation of 2 MD's agreement:  Yes Code 44 added to claim:  Yes    Angelita Ingles, RN 06/29/2020, 9:44 AM

## 2020-06-29 NOTE — Evaluation (Signed)
Physical Therapy Evaluation and Discharge Patient Details Name: Tonya Wise MRN: [id number] DOB: 04-22-1941 Today's Date: 06/29/2020   History of Present Illness  Pt is a 79 y/o female now s/p L2-3 Anterolateral lumbar interbody fusion. PMHx includes previous spinal surgery, HTN, L ankle fx and ORIF (2017)    Clinical Impression  Patient evaluated by Physical Therapy with no further acute PT needs identified. All education has been completed and the patient has no further questions. Pt was able to demonstrate transfers and ambulation with gross modified independence to supervision for safety without an AD. Pt was educated on precautions, brace application/wearing schedule, appropriate activity progression, and car transfer. See below for any follow-up Physical Therapy or equipment needs. PT is signing off. Thank you for this referral.     Follow Up Recommendations No PT follow up;Supervision for mobility/OOB    Equipment Recommendations  None recommended by PT    Recommendations for Other Services       Precautions / Restrictions Precautions Precautions: Fall;Back Precaution Booklet Issued: Yes (comment) Precaution Comments: issued and reviewed  Required Braces or Orthoses: Spinal Brace Spinal Brace: Lumbar corset;Applied in sitting position Restrictions Weight Bearing Restrictions: No      Mobility  Bed Mobility               General bed mobility comments: Pt was received sitting up EOB.     Transfers Overall transfer level: Modified independent Equipment used: None Transfers: Sit to/from Stand           General transfer comment: No assist required. Pt without balance disturbance with sit<>stand  Ambulation/Gait Ambulation/Gait assistance: Supervision;Min guard Gait Distance (Feet): 400 Feet Assistive device: None Gait Pattern/deviations: Step-through pattern;Decreased stride length;Drifts right/left Gait velocity: Mildly decreased Gait velocity  interpretation: >2.62 ft/sec, indicative of community ambulatory General Gait Details: Pt ambulating quickly in hall with lateral deviation/drifting throughout gait training. Close guard to supervision provided for safety. Pt attributes this to pain medication.   Stairs Stairs: Yes Stairs assistance: Supervision Stair Management: One rail Left;Alternating pattern;Forwards Number of Stairs: 10 General stair comments: Pt was able to negotiate a full flight of stairs without difficulty.   Wheelchair Mobility    Modified Rankin (Stroke Patients Only)       Balance Overall balance assessment: No apparent balance deficits (not formally assessed)                                           Pertinent Vitals/Pain Pain Assessment: Faces Faces Pain Scale: Hurts a little bit Pain Location: back, incisional Pain Descriptors / Indicators: Discomfort    Home Living Family/patient expects to be discharged to:: Private residence Living Arrangements: Spouse/significant other Available Help at Discharge: Family Type of Home: House Home Access: Stairs to enter Entrance Stairs-Rails: Psychiatric nurse of Steps: at least 2 Home Layout: Two level;Able to live on main level with bedroom/bathroom (planning to stay on main level) Home Equipment: Walker - 2 wheels;Bedside commode;Tub bench;Hand held shower head;Wheelchair - manual      Prior Function Level of Independence: Independent               Hand Dominance   Dominant Hand: Right    Extremity/Trunk Assessment   Upper Extremity Assessment Upper Extremity Assessment: Defer to OT evaluation    Lower Extremity Assessment Lower Extremity Assessment: Overall WFL for tasks assessed    Cervical /  Trunk Assessment Cervical / Trunk Assessment: Other exceptions Cervical / Trunk Exceptions: s/p spinal surgery  Communication   Communication: No difficulties  Cognition Arousal/Alertness:  Awake/alert Behavior During Therapy: WFL for tasks assessed/performed Overall Cognitive Status: Within Functional Limits for tasks assessed                                        General Comments      Exercises     Assessment/Plan    PT Assessment Patent does not need any further PT services  PT Problem List         PT Treatment Interventions      PT Goals (Current goals can be found in the Care Plan section)  Acute Rehab PT Goals Patient Stated Goal: home today PT Goal Formulation: All assessment and education complete, DC therapy    Frequency     Barriers to discharge        Co-evaluation               AM-PAC PT "6 Clicks" Mobility  Outcome Measure Help needed turning from your back to your side while in a flat bed without using bedrails?: None Help needed moving from lying on your back to sitting on the side of a flat bed without using bedrails?: None Help needed moving to and from a bed to a chair (including a wheelchair)?: None Help needed standing up from a chair using your arms (e.g., wheelchair or bedside chair)?: None Help needed to walk in hospital room?: A Little Help needed climbing 3-5 steps with a railing? : None 6 Click Score: 23    End of Session Equipment Utilized During Treatment: Gait belt;Back brace Activity Tolerance: Patient tolerated treatment well Patient left: with call bell/phone within reach;with family/visitor present (Sitting EOB) Nurse Communication: Mobility status PT Visit Diagnosis: Unsteadiness on feet (R26.81);Pain Pain - part of body:  (back)    Time: 6301-6010 PT Time Calculation (min) (ACUTE ONLY): 13 min   Charges:   PT Evaluation $PT Eval Low Complexity: 1 Low          Rolinda Roan, PT, DPT Acute Rehabilitation Services Pager: [phone] Office: [phone]   Thelma Comp 06/29/2020, 1:48 PM

## 2020-06-29 NOTE — Plan of Care (Signed)
Pt doing well. Pt and husband given D/C instructions with verbal understanding. Pt's incision is clean and dry with no sign of infection. Pt's IV was removed prior to D/C. Pt D/C'd home via wheelchair per MD order. Pt is stable @ D/C and has no other needs at this time. Selena Swaminathan, RN  

## 2020-06-29 NOTE — Care Management Obs Status (Signed)
MEDICARE OBSERVATION STATUS NOTIFICATION   Patient Details  Name: Tonya Wise MRN: [id number] Date of Birth: Jun 06, 1941   Medicare Observation Status Notification Given:  Yes    Angelita Ingles, RN 06/29/2020, 9:44 AM

## 2020-06-29 NOTE — Evaluation (Signed)
Occupational Therapy Evaluation Patient Details Name: Tonya Wise MRN: [id number] DOB: Sep 28, 1940 Today's Date: 06/29/2020    History of Present Illness Pt is a 79 y/o female now s/p L2-3 Anterolateral lumbar interbody fusion. PMHx includes previous spinal surgery, HTN, L ankle fx and ORIF (2017)   Clinical Impression   This 79 y/o female presents with the above. PTA pt reports being independent with ADL and mobility tasks. Today pt performing ADL and mobility tasks without AD overall at supervision-minguard assist level throughout. Educated and reviewed with both pt/pt's spouse re: back precautions, brace management, safety and compensatory strategies for completing ADL and mobility tasks while maintaining precautions. Pt requiring min cues for carryover of back precautions which improved as session progressed. She reports plans to return home with spouse who can assist with ADL/iADL PRN. All questions answered with no further acute OT needs identified at this time. Feel pt is safe to return home once medically ready. Acute OT to sign off, thank you for this referral.     Follow Up Recommendations  No OT follow up;Supervision/Assistance - 24 hour (24hr initially)    Equipment Recommendations  None recommended by OT (pt's DME needs are met )           Precautions / Restrictions Precautions Precautions: Fall;Back Precaution Booklet Issued: Yes (comment) Precaution Comments: issued and reviewed  Required Braces or Orthoses: Spinal Brace Spinal Brace: Lumbar corset;Applied in sitting position Restrictions Weight Bearing Restrictions: No      Mobility Bed Mobility Overal bed mobility: Needs Assistance Bed Mobility: Sidelying to Sit   Sidelying to sit: Supervision       General bed mobility comments: for safety, no assist needed, pt transitioning to sitting without use of bedrail     Transfers Overall transfer level: Needs assistance Equipment used: None Transfers: Sit  to/from Stand Sit to Stand: Supervision         General transfer comment: for balance and safety, multiple sit<>stand during session     Balance Overall balance assessment: No apparent balance deficits (not formally assessed)                                         ADL either performed or assessed with clinical judgement   ADL Overall ADL's : Needs assistance/impaired Eating/Feeding: Modified independent;Sitting   Grooming: Supervision/safety;Standing   Upper Body Bathing: Supervision/ safety;Sitting   Lower Body Bathing: Supervison/ safety;Sit to/from stand   Upper Body Dressing : Modified independent;Sitting Upper Body Dressing Details (indicate cue type and reason): overhead shirt and lumbar brace  Lower Body Dressing: Min guard;Sit to/from stand Lower Body Dressing Details (indicate cue type and reason): increased time/effort especially for socks but no physical assist required. pt using figure 4 with min cues for carryover of back precautions  Toilet Transfer: Supervision/safety;Ambulation Toilet Transfer Details (indicate cue type and reason): simulated via transfer to/from EOB  Toileting- Clothing Manipulation and Hygiene: Min guard;Sit to/from Nurse, children's Details (indicate cue type and reason): pt has tub bench which she has used in the past and able to verbalize to OT method for transfer  Functional mobility during ADLs: Supervision/safety General ADL Comments: min cues required for carryover of precautions during functional tasks                          Pertinent Vitals/Pain Pain  Assessment: Faces Faces Pain Scale: Hurts a little bit Pain Location: back, incisional Pain Descriptors / Indicators: Discomfort Pain Intervention(s): Limited activity within patient's tolerance;Monitored during session;Premedicated before session     Hand Dominance     Extremity/Trunk Assessment Upper Extremity Assessment Upper Extremity  Assessment: Overall WFL for tasks assessed   Lower Extremity Assessment Lower Extremity Assessment: Defer to PT evaluation   Cervical / Trunk Assessment Cervical / Trunk Assessment: Other exceptions Cervical / Trunk Exceptions: s/p spinal surgery   Communication Communication Communication: No difficulties   Cognition Arousal/Alertness: Awake/alert Behavior During Therapy: WFL for tasks assessed/performed Overall Cognitive Status: Within Functional Limits for tasks assessed                                     General Comments       Exercises     Shoulder Instructions      Home Living Family/patient expects to be discharged to:: Private residence Living Arrangements: Spouse/significant other Available Help at Discharge: Family Type of Home: House Home Access: Stairs to enter [employer] of Steps: at least 2 Entrance Stairs-Rails: Right;Left Home Layout: Two level;Able to live on main level with bedroom/bathroom (planning to stay on main level)     Bathroom Shower/Tub: Tub/shower unit   Bathroom Toilet: Handicapped height     Home Equipment: Environmental consultant - 2 wheels;Bedside commode;Tub bench;Hand held shower head;Wheelchair - manual          Prior Functioning/Environment Level of Independence: Independent                 OT Problem List: Decreased range of motion;Decreased strength;Decreased knowledge of precautions;Pain;Decreased activity tolerance      OT Treatment/Interventions:      OT Goals(Current goals can be found in the care plan section) Acute Rehab OT Goals Patient Stated Goal: home today OT Goal Formulation: All assessment and education complete, DC therapy  OT Frequency:     Barriers to D/C:            Co-evaluation              AM-PAC OT "6 Clicks" Daily Activity     Outcome Measure Help from another person eating meals?: None Help from another person taking care of personal grooming?: A Little Help from  another person toileting, which includes using toliet, bedpan, or urinal?: A Little Help from another person bathing (including washing, rinsing, drying)?: A Little Help from another person to put on and taking off regular upper body clothing?: None Help from another person to put on and taking off regular lower body clothing?: A Little 6 Click Score: 20   End of Session Equipment Utilized During Treatment: Back brace Nurse Communication: Mobility status  Activity Tolerance: Patient tolerated treatment well Patient left: with call bell/phone within reach;with family/visitor present;Other (comment) (seated EOB )  OT Visit Diagnosis: Other abnormalities of gait and mobility (R26.89);Pain Pain - part of body:  (back)                Time: 8127-5170 OT Time Calculation (min): 22 min Charges:  OT General Charges $OT Visit: 1 Visit OT Evaluation $OT Eval Low Complexity: [place], OT Acute Rehabilitation Services Pager [phone] Office [phone]   Raymondo Band 06/29/2020, 9:17 AM

## 2020-06-30 LAB — TYPE AND SCREEN
ABO/RH(D): B POS
Antibody Screen: POSITIVE
Unit division: 0
Unit division: 0

## 2020-06-30 LAB — BPAM RBC
Blood Product Expiration Date: 202112102359
Blood Product Expiration Date: 202112122359
Unit Type and Rh: 7300
Unit Type and Rh: 7300

## 2020-07-06 ENCOUNTER — Ambulatory Visit (HOSPITAL_COMMUNITY)
Admission: RE | Admit: 2020-07-06 | Discharge: 2020-07-06 | Disposition: A | Discharge status: Home / Self Care | Payer: Medicare Other | Source: Ambulatory Visit | Attending: Physician Assistant | Admitting: Physician Assistant

## 2020-07-06 ENCOUNTER — Other Ambulatory Visit: Payer: Self-pay

## 2020-07-06 ENCOUNTER — Other Ambulatory Visit (HOSPITAL_COMMUNITY): Payer: Self-pay | Admitting: Neurological Surgery

## 2020-07-06 DIAGNOSIS — M48061 Spinal stenosis, lumbar region without neurogenic claudication: Secondary | ICD-10-CM | POA: Diagnosis not present

## 2020-07-06 DIAGNOSIS — I82453 Acute embolism and thrombosis of peroneal vein, bilateral: Secondary | ICD-10-CM | POA: Diagnosis not present

## 2020-07-06 DIAGNOSIS — I10 Essential (primary) hypertension: Secondary | ICD-10-CM | POA: Diagnosis not present

## 2020-08-04 ENCOUNTER — Other Ambulatory Visit: Payer: Self-pay | Admitting: Family Medicine

## 2020-08-04 DIAGNOSIS — I10 Essential (primary) hypertension: Secondary | ICD-10-CM

## 2020-08-10 ENCOUNTER — Telehealth: Payer: Self-pay | Admitting: Cardiovascular Disease

## 2020-08-10 NOTE — Telephone Encounter (Signed)
New Message:     Pt wants to know if she is supposed to have lab work before her appt on 08-24-20?

## 2020-08-10 NOTE — Telephone Encounter (Signed)
Spoke with pt on the phone. Per OV with Dr. Claiborne Billings in Sept. 2021, he would like her to have a fasting lipid and CMET done prior to her appointment with him 08/24/20.  Printed lab orders and mailed to pt. Explained to pt that she can have labs done at our office or any [facility] that is convenient for her. Instructed pt that labs just need to be done 2 days prior to office visit so that Dr. Claiborne Billings would have results. Pt verbalizes understanding.

## 2020-08-17 DIAGNOSIS — I10 Essential (primary) hypertension: Secondary | ICD-10-CM | POA: Diagnosis not present

## 2020-08-17 DIAGNOSIS — I427 Cardiomyopathy due to drug and external agent: Secondary | ICD-10-CM | POA: Diagnosis not present

## 2020-08-17 DIAGNOSIS — M810 Age-related osteoporosis without current pathological fracture: Secondary | ICD-10-CM | POA: Diagnosis not present

## 2020-08-17 DIAGNOSIS — T451X5A Adverse effect of antineoplastic and immunosuppressive drugs, initial encounter: Secondary | ICD-10-CM | POA: Diagnosis not present

## 2020-08-17 DIAGNOSIS — I5042 Chronic combined systolic (congestive) and diastolic (congestive) heart failure: Secondary | ICD-10-CM | POA: Diagnosis not present

## 2020-08-18 LAB — COMPREHENSIVE METABOLIC PANEL
ALT: 10 IU/L (ref 0–32)
AST: 19 IU/L (ref 0–40)
Albumin/Globulin Ratio: 2.3 — ABNORMAL HIGH (ref 1.2–2.2)
Albumin: 4.4 g/dL (ref 3.7–4.7)
Alkaline Phosphatase: 68 IU/L (ref 44–121)
BUN/Creatinine Ratio: 12 (ref 12–28)
BUN: 18 mg/dL (ref 8–27)
Bilirubin Total: 0.5 mg/dL (ref 0.0–1.2)
CO2: 24 mmol/L (ref 20–29)
Calcium: 9.5 mg/dL (ref 8.7–10.3)
Chloride: 103 mmol/L (ref 96–106)
Creatinine, Ser: 1.48 mg/dL — ABNORMAL HIGH (ref 0.57–1.00)
GFR calc Af Amer: 39 mL/min/{1.73_m2} — ABNORMAL LOW (ref >59)
GFR calc non Af Amer: 33 mL/min/{1.73_m2} — ABNORMAL LOW (ref >59)
Globulin, Total: 1.9 g/dL (ref 1.5–4.5)
Glucose: 99 mg/dL (ref 65–99)
Potassium: 4.2 mmol/L (ref 3.5–5.2)
Sodium: 144 mmol/L (ref 134–144)
Total Protein: 6.3 g/dL (ref 6.0–8.5)

## 2020-08-18 LAB — LIPID PANEL
Chol/HDL Ratio: 4 ratio (ref 0.0–4.4)
Cholesterol, Total: 209 mg/dL — ABNORMAL HIGH (ref 100–199)
HDL: 52 mg/dL (ref >39)
LDL Chol Calc (NIH): 115 mg/dL — ABNORMAL HIGH (ref 0–99)
Triglycerides: 245 mg/dL — ABNORMAL HIGH (ref 0–149)
VLDL Cholesterol Cal: 42 mg/dL — ABNORMAL HIGH (ref 5–40)

## 2020-08-24 ENCOUNTER — Encounter: Payer: Self-pay | Admitting: Cardiovascular Disease

## 2020-08-24 ENCOUNTER — Other Ambulatory Visit: Payer: Self-pay

## 2020-08-24 ENCOUNTER — Ambulatory Visit (INDEPENDENT_AMBULATORY_CARE_PROVIDER_SITE_OTHER): Payer: Medicare Other | Admitting: Cardiovascular Disease

## 2020-08-24 DIAGNOSIS — N1832 Chronic kidney disease, stage 3b: Secondary | ICD-10-CM | POA: Diagnosis not present

## 2020-08-24 DIAGNOSIS — I10 Essential (primary) hypertension: Secondary | ICD-10-CM

## 2020-08-24 DIAGNOSIS — Z79899 Other long term (current) drug therapy: Secondary | ICD-10-CM

## 2020-08-24 DIAGNOSIS — E782 Mixed hyperlipidemia: Secondary | ICD-10-CM | POA: Diagnosis not present

## 2020-08-24 MED ORDER — LOSARTAN POTASSIUM 100 MG PO TABS: 100.0000 mg | ORAL_TABLET | Freq: Every day | ORAL | 6 refills | Status: DC

## 2020-08-24 NOTE — Patient Instructions (Signed)
Medication Instructions:  STOP LOSARTAN-HCTZ  START LOSARTAN 100MG  DAILY  *If you need a refill on your cardiac medications before your next appointment, please call your pharmacy*  Lab Work: FASTING LIPID AND CMET IN 4 MONTHS If you have labs (blood work) drawn today and your tests are completely normal, you will receive your results only by:  [place] (if you have MyChart) OR A paper copy in the mail.  If you have any lab test that is abnormal or we need to change your treatment, we will call you to review the results. You may go to any [facility] that is convenient for you however, we do have a lab in our office that is able to assist you. You DO NOT need an appointment for our lab. The lab is open 8:00am and closes at 4:00pm. Lunch 12:45 - 1:45pm.  Special Instructions MONITOR BLOOD PRESSURE IF >130'S-140'S TAKE ADDITIONAL AMLODIPINE 5MG .  DECREASE CARBS AND SWEETS IN YOUR DIET  Follow-Up: Your next appointment:  3 month(s) In Person with Shelva Majestic, MD   At [facility], you and your health needs are our priority.  As part of our continuing mission to provide you with exceptional heart care, we have created designated Provider Care Teams.  These Care Teams include your primary Cardiologist (physician) and Advanced Practice Providers (APPs -  Physician Assistants and Nurse Practitioners) who all work together to provide you with the care you need, when you need it.

## 2020-08-24 NOTE — Progress Notes (Signed)
Cardiology Office Note    Date:  08/28/2020   ID:  Tonya Wise, Tonya Wise [birth year], MRN [id number]  PCP:  Denita Lung, MD  Cardiologist:  Shelva Majestic, MD   4 month follow-up evaluation  History of Present Illness:  Tonya Wise is a 80 y.o. female who was diagnosed as having non-Hodgkin's lymphoma in 2014. She received 2 treatments of chemotherapy. On 06/16/2013 she underwent a followup CT scan which showed slight interval reduction in axillary lymphadenopathy but there was a 1 small residual right axillary lymph node measuring 16 a 12 mm. She also is noted a significant reduction in mediastinal and hilar adenopathy. However, it was noted that she had a left-sided pulmonary embolism. There also was resolution of bulky abdominal lymphadenopathy as well as interval decrease in the size of her spleen since initiating chemotherapy. Because of the diagnosis of left-sided pulmonary embolism she apparently started therapy with combination Lovenox as well as oral Coumadin. She apparently was seen in Dr. Lanice Shirts office on 06/17/2013 with complaints of increasing cough that has become productive. She was started on Zithromax for possible acute bronchitis. She was tachycardic and did have T-wave changes.   A 2-D echo Doppler study in September 2014 while she was hospitalized at [facility] demonstrated an ejection fraction was 50-55%. She had pulmonary hypertension with estimated RV systolic pressure at 39 mm and Grade 1 diastolic dysfunction.  When I initially saw her in November 2014, I  recommend that she undergo a nuclear perfusion study because of some  chest discomfort. Her nuclear perfusion study demonstrated normal perfusion, however ejection fraction was interpreted at 42%. Subsequently, a followup echo Doppler study was recommended and also discuss this with her oncologist, Dr. Juliann Mule. Her followup echo Doppler study on 07/16/2013 confirmed LV dysfunction and ejection fraction was  now 35-40%. There was diffuse hypokinesis. She now had grade 2 diastolic dysfunction. Due to her reduction of LV function, subsequent chemotherapyhas been at a reduced dose. She completed cycle 5 of mini-R-CEO. She does have thrombocytopenia and when last seen by Dr. Juliann Mule her platelets were 59,000. She is scheduled to undergo an additional week of recovery prior to undergoing a next course of chemotherapy.  She completed her last chemotherapy dose in March 2015.  Recently, she has had issues with her blood pressure being elevated.  She states her platelet count has improved and most recently this has been 122,000.  I saw her 3 months ago, her ECG revealed sinus bradycardia on her dose of metoprolol, tartrate at 37.5 twice a day, and I did not further increase this; however, with her previous diastolic dysfunction.  Iurther titrated her losartan to 100 mg daily.  She has been taking this 50 mg twice a day.  I recommended that she have a follow-up echo Doppler study which was done on 09/21/2014 and demonstrated an ejection fraction in the range of 35-40% with previously noted diffuse hypokinesis.  She again had a  pseudonormal left ventricular filling pattern consistent with grade 2 diastolic dysfunction.  There was mild mitral regurgitation.  She states when she was recently seen at the cancer center.  Her blood pressure was elevated.  However, she was anxious that they said she was having a follow-up CT scan.  She was evaluated by Bernerd Pho, PA-C in June 2018.  At that time she continued to feel well but did experience occasional fatigue but remained active.  In September 2019 she was evaluated by Fabian Sharp and prior  to that evaluation was having some difficulty with blood pressure control.  During that evaluation she was doing well on an increased dose of Lopressor 100 mg twice a day and losartan HCT 100/12.5 mg.  She has continued to be followed by Dr. Earlie Server for her diffuse large B cell  non-Hodgkin's lymphoma.  She is felt to be stable.  She recently was seen by Dr. Jill Alexanders who is her primary MD.  During his evaluation she was hypertensive and there was concern of some development of renal insufficiency.  Laboratory in May 2021 showed mild thrombocytopenia with a platelet count of 127.  Creatinine was 1.38 consistent with stage IIIb CKD.  After not having seen her since May 2016, I saw her on 03/30/2020 for cardiology evaluation.  At that time, she was doing well and remained active doing both gardening and yard work as well as housework.  She specifically denied any chest pain.  She did experience some mild shortness of breath particularly when walking uphill.  She had not had any recent assessment of LV function and presented for evaluation.  During that evaluation I recommended she undergo a follow-up echo Doppler study which was done on 04/13/2020.  This reveals EF at 55 to 60% with mild grade 1 diastolic dysfunction.  She did not have any regional wall motion abnormalities.  There was mild mitral regurgitation.  Ejection fraction had improved from February 2016 at which time it was noted to be 35 to 40%.  I saw her for follow-up on April 15, 2020.  She is not having any recurrent chest pain but was experiencing chronic low back discomfort.  Remotely she had undergone prior disc surgery with involving 4 discs.  She had had neurosurgery done in 2004 and 2005.  She has not been reevaluated by her neurosurgeon who no longer is in practice.  During that evaluation, I referred her to Dr. Kristeen Miss.  Since I saw her, she apparently underwent back surgery by Dr. Ellene Route on June 28, 2020 for retrolisthesis of L2 on L3 with spinal stenosis and neurogenic claudication, lumbar radiculopathy with history of fusion L3-L5.  She tolerated surgery well from a cardiac standpoint.  She underwent recent laboratory on August 17, 2020 which showed increased total cholesterol at 209,  triglycerides 245, VLDL 42, and LDL 115.  Creatinine was 1.48.  She has been on losartan HCT 100/12.5 mg, metoprolol succinate 100 mg daily, amlodipine 2.5 mg and takes as needed prednisone for episodes of acute diarrhea.  She also is on Fosamax.  She presents for evaluation.  Past Medical History:  Diagnosis Date  . Allergy   . Anemia   . Ankle fracture, left   . Chemotherapy induced cardiomyopathy ([place])    a. 08/2014: echo showing EF of 35-40% with Grade 2 DD and mild MR  . Diastolic dysfunction, grade I 04/12/2013  . GERD (gastroesophageal reflux disease)    Tums or Maalox  . Heart murmur   . Hypercalcemia 03/27/2013  . Hypertension   . Non-Hodgkins lymphoma ([place]) 04/06/2013  . Osteopenia   . Renal insufficiency   . Stroke [facility]) 2015   per pt while she was in hospital for Nonhodgkins Lymphoma    Past Surgical History:  Procedure Laterality Date  . ABDOMINAL HYSTERECTOMY    . ANTERIOR LAT LUMBAR FUSION N/A 06/28/2020   Procedure: Lumbar two-three Anterolateral lumbar interbody fusion with lateral plate;  Surgeon: Kristeen Miss, MD;  Location: [place];  Service: Neurosurgery;  Laterality: N/A;  .  APPENDECTOMY    . BACK SURGERY     LOWER BACK TWICE  . COLONOSCOPY  2008  . ESOPHAGOGASTRODUODENOSCOPY N/A 03/29/2013   Procedure: ESOPHAGOGASTRODUODENOSCOPY (EGD);  Surgeon: Juanita Craver, MD;  [facility] ENDOSCOPY;  Service: Endoscopy;  Laterality: N/A;  . INGUINAL LYMPH NODE BIOPSY Right 03/31/2013   Procedure: INGUINAL LYMPH NODE BIOPSY;  Surgeon: Gwenyth Ober, MD;  Location: [place];  Service: General;  Laterality: Right;  . ORIF ANKLE FRACTURE Left 11/03/2015   Procedure: OPEN REDUCTION INTERNAL FIXATION (ORIF) LEFT ANKLE FRACTURE;  Surgeon: Wylene Simmer, MD;  Location: [place];  Service: Orthopedics;  Laterality: Left;  . SPINE SURGERY      Current Medications: Outpatient Medications Prior to Visit  Medication Sig Dispense Refill  . alendronate (FOSAMAX) 70 MG tablet  TAKE 1 TABLET BY MOUTH EVERY 7 DAYS. TAKE WITH A FULL GLASS OF WATER ON AN EMPTY STOMACH 12 tablet 0  . amLODipine (NORVASC) 2.5 MG tablet TAKE 1 TABLET BY MOUTH EVERY DAY (Patient taking differently: Take 2.5 mg by mouth daily.) 90 tablet 1  . aspirin EC 81 MG tablet Take 1 tablet (81 mg total) by mouth 2 (two) times daily. (Patient taking differently: Take 81 mg by mouth daily.) 90 tablet 0  . Melatonin 3 MG CAPS Take 3 mg by mouth at bedtime as needed (sleep).    . RESTASIS 0.05 % ophthalmic emulsion Place 1 drop into both eyes 2 (two) times daily.     Marland Kitchen losartan-hydrochlorothiazide (HYZAAR) 100-12.5 MG tablet TAKE 1 TABLET BY MOUTH EVERY DAY 90 tablet 0  . Aspirin-Caffeine (ANACIN) 400-32 MG TABS Take 2 tablets by mouth daily as needed (pain). (Patient not taking: Reported on 08/24/2020)    . methocarbamol (ROBAXIN) 500 MG tablet Take 1 tablet (500 mg total) by mouth every 6 (six) hours as needed for muscle spasms. (Patient not taking: Reported on 08/24/2020) 40 tablet 3  . metoprolol succinate (TOPROL-XL) 100 MG 24 hr tablet Take 1 tablet (100 mg total) by mouth daily. Take with or immediately following a meal. 90 tablet 3  . predniSONE (DELTASONE) 10 MG tablet Take 40 mg by mouth daily. (Patient not taking: Reported on 08/24/2020)    . BIOTIN PO Take 2 tablets by mouth daily. (Patient not taking: Reported on 08/24/2020)    . Cholecalciferol (VITAMIN D3) 250 MCG (10000 UT) TABS Take 10,000 Units by mouth daily. (Patient not taking: Reported on 08/24/2020)    . Multiple Vitamin (MULTIVITAMIN WITH MINERALS) TABS tablet Take 1 tablet by mouth daily.    Marland Kitchen oxyCODONE-acetaminophen (PERCOCET/ROXICET) 5-325 MG tablet Take 1-2 tablets by mouth every 3 (three) hours as needed for moderate pain or severe pain. 40 tablet 0   No facility-administered medications prior to visit.     Allergies:   Prednisone and Penicillins   Social History   Socioeconomic History  . Marital status: Married    Spouse name:  [name]  . Number of children: 2  . Years of education: 7  . Highest education level: Not on file  Occupational History    Employer: [employer]  Tobacco Use  . Smoking status: Never Smoker  . Smokeless tobacco: Never Used  Substance and Sexual Activity  . Alcohol use: No  . Drug use: No  . Sexual activity: Yes    Partners: Male  Other Topics Concern  . Not on file  Social History Narrative   Married.  Lives in [place] with husband.     Social  Determinants of Health   Financial Resource Strain: Not on file  Food Insecurity: Not on file  Transportation Needs: Not on file  Physical Activity: Not on file  Stress: Not on file  Social Connections: Not on file    Social history is notable in that she is married for 60 years. She completed 12 grade of education. She is retired. She has 2 children, 60, and 56 and 2 grandchildren, 92 and 16.. No tobacco use presently. She quit remotely in 1962. There is no alcohol use. She does not routinely exercise.  Family History:  The patient's family history includes Diabetes in her brother and sister; Heart attack (age of onset: 29) in her father; Leukemia in her mother.   ROS General: Negative; No fevers, chills, or night sweats;  HEENT: Negative; No changes in vision or hearing, sinus congestion, difficulty swallowing Pulmonary: Negative; No cough, wheezing, shortness of breath, hemoptysis Cardiovascular: See HPI GI: History of diarrhea intermittently GU: Negative; No dysuria, hematuria, or difficulty voiding Musculoskeletal: Chronic low back discomfort which has progressed.  Status post 2 back surgeries 2004 and 2005.  Recent back surgery June 28, 2020 by Dr. 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   PHYSICAL EXAM:   VS:  BP 140/70 (BP Location: Left Arm, Patient Position: Sitting)   Pulse 63   Ht 5' 2.5" (1.588 m)   Wt 133 lb (60.3 kg)   SpO2 97%   BMI 23.94 kg/m     Repeat blood pressure by me was 108/64  Wt Readings from Last 3 Encounters:  08/24/20 133 lb (60.3 kg)  06/28/20 141 lb 15.6 oz (64.4 kg)  06/17/20 141 lb 14.4 oz (64.4 kg)    General: Alert, oriented, no distress.  Skin: normal turgor, no rashes, warm and dry HEENT: Normocephalic, atraumatic. Pupils equal round and reactive to light; sclera anicteric; extraocular muscles intact; Nose without nasal septal hypertrophy Mouth/Parynx benign; Mallinpatti scale 2 Neck: No JVD, no carotid bruits; normal carotid upstroke Lungs: clear to ausculatation and percussion; no wheezing or rales Chest wall: without tenderness to palpitation Heart: PMI not displaced, RRR, s1 s2 normal, 1/6 systolic murmur, no diastolic murmur, no rubs, gallops, thrills, or heaves Abdomen: soft, nontender; no hepatosplenomehaly, BS+; abdominal aorta nontender and not dilated by palpation. Back: no CVA tenderness Pulses 2+ Musculoskeletal: full range of motion, normal strength, no joint deformities Extremities: no clubbing cyanosis or edema, Homan's sign negative  Neurologic: grossly nonfocal; Cranial nerves grossly wnl Psychologic: Normal mood and affect   Studies/Labs Reviewed:   ECG (independently read by me): NSR at 63; NSSTT changes  03/30/2020 ECG (independently read by me): Sinus bradycardia at 58; QS V1-2; no ectopy, normal intervals  Recent Labs: BMP Latest Ref Rng & Units 08/17/2020 06/17/2020 04/05/2020  Glucose 65 - 99 mg/dL 99 115(H) 121(H)  BUN 8 - 27 mg/dL '18 19 19   ' Creatinine 0.57 - 1.00 mg/dL 1.48(H) 1.49(H) 1.48(H)  BUN/Creat Ratio 12 - 28 12 - 13  Sodium 134 - 144 mmol/L 144 140 144  Potassium 3.5 - 5.2 mmol/L 4.2 4.5 5.2  Chloride 96 - 106 mmol/L 103 100 103  CO2  20 - 29 mmol/L '24 27 27  ' Calcium 8.7 - 10.3 mg/dL 9.5 9.6 9.5     Hepatic Function Latest Ref Rng & Units 08/17/2020 04/05/2020 12/07/2019  Total Protein 6.0 - 8.5 g/dL 6.3 5.9(L) 6.0(L)  Albumin 3.7 - 4.7 g/dL 4.4 4.2 3.6  AST 0 - 40 IU/L '19 20 15  ' ALT 0 - 32 IU/L '10 12 9  ' Alk Phosphatase 44 - 121 IU/L 68 60 52  Total Bilirubin 0.0 - 1.2 mg/dL 0.5 0.4 0.5    CBC Latest Ref Rng & Units 06/17/2020 04/05/2020 12/07/2019  WBC 4.0 - 10.5 K/uL 5.1 4.8 4.3  Hemoglobin 12.0 - 15.0 g/dL 14.1 14.7 13.7  Hematocrit 36.0 - 46.0 % 44.4 45.2 42.4  Platelets 150 - 400 K/uL 124(L) 156 127(L)   Lab Results  Component Value Date   MCV 86.7 06/17/2020   MCV 86 04/05/2020   MCV 88.5 12/07/2019   Lab Results  Component Value Date   TSH 2.730 04/05/2020   Lab Results  Component Value Date   HGBA1C 5.4 01/14/2019     BNP    Component Value Date/Time   BNP 115.7 (H) 04/05/2020 0928   BNP 547.6 (H) 06/17/2013 1704    ProBNP    Component Value Date/Time   PROBNP 21,795.0 (H) 08/21/2013 1905     Lipid Panel     Component Value Date/Time   CHOL 209 (H) 08/17/2020 0819   TRIG 245 (H) 08/17/2020 0819   HDL 52 08/17/2020 0819   CHOLHDL 4.0 08/17/2020 0819   CHOLHDL 3.4 12/27/2016 1029   VLDL 28 12/27/2016 1029   LDLCALC 115 (H) 08/17/2020 0819   LABVLDL 42 (H) 08/17/2020 0819     RADIOLOGY: No results found.   Additional studies/ records that were reviewed today include:  I reviewed the patient's record since my last evaluation over 5 years ago  ASSESSMENT:    1. Essential hypertension   2. Medication management   3. Mixed hyperlipidemia   4. Stage 3b chronic kidney disease [facility])     PLAN:  Ms. Omya Winfield is a very pleasant 80 year old young appearing female who  has a history of non-Hodgkin's lymphoma initially diagnosed in September 2014.  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  Her lymphoma fortunately has stabilized. A CT in May 2021 did not demonstrate any residual evidence for recurrent lymphoma within the chest, abdomen or pelvis.  She has had issues with hypertension.  A prior echo Doppler study in February 2016 showed an EF of 35 to 40%.  When I saw her on 03/30/2020 after not having seen her since 2016, her blood pressure was elevated.  With her previous LV dysfunction I recommended she change from metoprolol tartrate 100 mg twice a day to metoprolol succinate 100 mg daily.  Her echo Doppler study from 04/13/2020 demonstrated normalization of LV function with only mild grade 1 diastolic dysfunction.  During her last evaluation on April 15, 2020 amlodipine was added at low-dose to her medical regimen for continued blood pressure elevation.  At that time she was having significant low back discomfort issues and she was referred by me to Dr. Kristeen Miss and she ultimately underwent neurosurgery for retrolisthesis of L2 on L3 with spinal stenosis neurogenic claudication and lumbar radiculopathy.  She tolerated surgery well.  Presently today, blood pressure on repeat by me is somewhat low.  Review of recent laboratory demonstrates stage  IIIb CKD with her creatinine at 1.48.  Presently there is no edema.  I am recommending she discontinue her losartan HCT 100/12.5 and in its place will resume just losartan 100 mg alone without the diuretic.  I reviewed her lipid studies which revealed a mixed hyperlipidemia pattern.  We discussed a heart healthy diet and the importance of reduction of carbohydrates and sweets.  If her blood pressure becomes elevated, further titration of amlodipine may be necessary.  Repeat laboratory will be obtained in approximately 3  to 4 months and I will see her for follow-up evaluation.    Medication Adjustments/Labs and Tests Ordered: Current medicines are reviewed at length with the patient today.  Concerns regarding medicines are outlined above.  Medication changes, Labs and Tests ordered today are listed in the Patient Instructions below. Patient Instructions  Medication Instructions:  STOP LOSARTAN-HCTZ  START LOSARTAN 100MG DAILY  *If you need a refill on your cardiac medications before your next appointment, please call your pharmacy*  Lab Work: FASTING LIPID AND CMET IN 4 MONTHS If you have labs (blood work) drawn today and your tests are completely normal, you will receive your results only by:  [place] (if you have MyChart) OR A paper copy in the mail.  If you have any lab test that is abnormal or we need to change your treatment, we will call you to review the results. You may go to any [facility] that is convenient for you however, we do have a lab in our office that is able to assist you. You DO NOT need an appointment for our lab. The lab is open 8:00am and closes at 4:00pm. Lunch 12:45 - 1:45pm.  Special Instructions MONITOR BLOOD PRESSURE IF >130'S-140'S TAKE ADDITIONAL AMLODIPINE 5MG.  DECREASE CARBS AND SWEETS IN YOUR DIET  Follow-Up: Your next appointment:  3 month(s) In Person with Shelva Majestic, MD   At [facility], you and your health needs are our priority.  As part of our continuing mission to provide you with exceptional heart care, we have created designated Provider Care Teams.  These Care Teams include your primary Cardiologist (physician) and Advanced Practice Providers (APPs -  Physician Assistants and Nurse Practitioners) who all work together to provide you with the care you need, when you need it.       Signed, Shelva Majestic, MD  08/28/2020 2:50 PM    [facility] [address] [place], [place],   [address] Phone: [phone]

## 2020-08-28 ENCOUNTER — Encounter: Payer: Self-pay | Admitting: Cardiovascular Disease

## 2020-09-14 ENCOUNTER — Other Ambulatory Visit: Payer: Self-pay | Admitting: Family Medicine

## 2020-09-14 DIAGNOSIS — M810 Age-related osteoporosis without current pathological fracture: Secondary | ICD-10-CM

## 2020-10-31 ENCOUNTER — Other Ambulatory Visit: Payer: Self-pay | Admitting: Family Medicine

## 2020-10-31 ENCOUNTER — Telehealth: Payer: Self-pay

## 2020-10-31 DIAGNOSIS — I10 Essential (primary) hypertension: Secondary | ICD-10-CM

## 2020-10-31 NOTE — Telephone Encounter (Signed)
Lvm for pt to call back to advise of what b/p med she is on. [place]

## 2020-10-31 NOTE — Telephone Encounter (Signed)
Lvm for pt to call back to advise of what b/p med she is on. [place]

## 2020-11-18 ENCOUNTER — Other Ambulatory Visit: Payer: Self-pay

## 2020-11-18 ENCOUNTER — Encounter: Payer: Self-pay | Admitting: Family Medicine

## 2020-11-18 ENCOUNTER — Ambulatory Visit (INDEPENDENT_AMBULATORY_CARE_PROVIDER_SITE_OTHER): Payer: Medicare Other | Admitting: Family Medicine

## 2020-11-18 VITALS — BP 146/80 | HR 62 | Temp 97.4°F | Ht 62.0 in | Wt 134.8 lb

## 2020-11-18 DIAGNOSIS — L03116 Cellulitis of left lower limb: Secondary | ICD-10-CM

## 2020-11-18 NOTE — Progress Notes (Signed)
   Subjective:    Patient ID: Tonya Wise, female    DOB: 03-16-1941, 80 y.o.   MRN: [id number]  HPI She injured her left shin several days ago and noted some pain and swelling in that area but states that today it is actually less swollen and painful.   Review of Systems     Objective:   Physical Exam Exam of the left lower third of her shin does show a 3 cm healing lesion with surrounding erythema of approximately 5 to 6 cm.  It is slightly tender but not warm.       Assessment & Plan:  Cellulitis of left lower extremity Her by seems to be taking care of it on its own.  I did mark the area with an ink pen so she can watch it and if the erythema gets worse, she will keep me informed and I will call in an antibiotic.

## 2020-12-05 DIAGNOSIS — Z79899 Other long term (current) drug therapy: Secondary | ICD-10-CM | POA: Diagnosis not present

## 2020-12-05 LAB — COMPREHENSIVE METABOLIC PANEL
ALT: 9 IU/L (ref 0–32)
AST: 17 IU/L (ref 0–40)
Albumin/Globulin Ratio: 2.3 — ABNORMAL HIGH (ref 1.2–2.2)
Albumin: 4.2 g/dL (ref 3.7–4.7)
Alkaline Phosphatase: 59 IU/L (ref 44–121)
BUN/Creatinine Ratio: 13 (ref 12–28)
BUN: 16 mg/dL (ref 8–27)
Bilirubin Total: 0.5 mg/dL (ref 0.0–1.2)
CO2: 25 mmol/L (ref 20–29)
Calcium: 9.9 mg/dL (ref 8.7–10.3)
Chloride: 101 mmol/L (ref 96–106)
Creatinine, Ser: 1.27 mg/dL — ABNORMAL HIGH (ref 0.57–1.00)
Globulin, Total: 1.8 g/dL (ref 1.5–4.5)
Glucose: 100 mg/dL — ABNORMAL HIGH (ref 65–99)
Potassium: 4.9 mmol/L (ref 3.5–5.2)
Sodium: 139 mmol/L (ref 134–144)
Total Protein: 6 g/dL (ref 6.0–8.5)
eGFR: 43 mL/min/{1.73_m2} — ABNORMAL LOW (ref >59)

## 2020-12-05 LAB — LIPID PANEL
Chol/HDL Ratio: 3.2 ratio (ref 0.0–4.4)
Cholesterol, Total: 177 mg/dL (ref 100–199)
HDL: 56 mg/dL (ref >39)
LDL Chol Calc (NIH): 90 mg/dL (ref 0–99)
Triglycerides: 183 mg/dL — ABNORMAL HIGH (ref 0–149)
VLDL Cholesterol Cal: 31 mg/dL (ref 5–40)

## 2020-12-06 ENCOUNTER — Encounter (HOSPITAL_COMMUNITY): Payer: Self-pay

## 2020-12-06 ENCOUNTER — Other Ambulatory Visit: Payer: Self-pay

## 2020-12-06 ENCOUNTER — Ambulatory Visit (HOSPITAL_COMMUNITY)
Admission: RE | Admit: 2020-12-06 | Discharge: 2020-12-06 | Disposition: A | Discharge status: Home / Self Care | Payer: Medicare Other | Source: Ambulatory Visit | Attending: Internal Medicine | Admitting: Internal Medicine

## 2020-12-06 ENCOUNTER — Inpatient Hospital Stay: Payer: Medicare Other | Attending: Internal Medicine

## 2020-12-06 DIAGNOSIS — M47814 Spondylosis without myelopathy or radiculopathy, thoracic region: Secondary | ICD-10-CM | POA: Diagnosis not present

## 2020-12-06 DIAGNOSIS — Z8572 Personal history of non-Hodgkin lymphomas: Secondary | ICD-10-CM | POA: Insufficient documentation

## 2020-12-06 DIAGNOSIS — M4326 Fusion of spine, lumbar region: Secondary | ICD-10-CM | POA: Diagnosis not present

## 2020-12-06 DIAGNOSIS — I2510 Atherosclerotic heart disease of native coronary artery without angina pectoris: Secondary | ICD-10-CM | POA: Diagnosis not present

## 2020-12-06 DIAGNOSIS — M4319 Spondylolisthesis, multiple sites in spine: Secondary | ICD-10-CM | POA: Diagnosis not present

## 2020-12-06 LAB — CMP (CANCER CENTER ONLY)
ALT: 8 U/L (ref 0–44)
AST: 16 U/L (ref 15–41)
Albumin: 3.8 g/dL (ref 3.5–5.0)
Alkaline Phosphatase: 54 U/L (ref 38–126)
Anion gap: 11 (ref 5–15)
BUN: 19 mg/dL (ref 8–23)
CO2: 28 mmol/L (ref 22–32)
Calcium: 9.3 mg/dL (ref 8.9–10.3)
Chloride: 104 mmol/L (ref 98–111)
Creatinine: 1.41 mg/dL — ABNORMAL HIGH (ref 0.44–1.00)
GFR, Estimated: 38 mL/min — ABNORMAL LOW (ref >60)
Glucose, Bld: 111 mg/dL — ABNORMAL HIGH (ref 70–99)
Potassium: 3.8 mmol/L (ref 3.5–5.1)
Sodium: 143 mmol/L (ref 135–145)
Total Bilirubin: 0.5 mg/dL (ref 0.3–1.2)
Total Protein: 6.2 g/dL — ABNORMAL LOW (ref 6.5–8.1)

## 2020-12-06 LAB — CBC WITH DIFFERENTIAL (CANCER CENTER ONLY)
Abs Immature Granulocytes: 0.02 10*3/uL (ref 0.00–0.07)
Basophils Absolute: 0 10*3/uL (ref 0.0–0.1)
Basophils Relative: 1 %
Eosinophils Absolute: 0.1 10*3/uL (ref 0.0–0.5)
Eosinophils Relative: 2 %
HCT: 44.1 % (ref 36.0–46.0)
Hemoglobin: 13.9 g/dL (ref 12.0–15.0)
Immature Granulocytes: 0 %
Lymphocytes Relative: 28 %
Lymphs Abs: 1.3 10*3/uL (ref 0.7–4.0)
MCH: 26.8 pg (ref 26.0–34.0)
MCHC: 31.5 g/dL (ref 30.0–36.0)
MCV: 85 fL (ref 80.0–100.0)
Monocytes Absolute: 0.4 10*3/uL (ref 0.1–1.0)
Monocytes Relative: 9 %
Neutro Abs: 2.7 10*3/uL (ref 1.7–7.7)
Neutrophils Relative %: 60 %
Platelet Count: 144 10*3/uL — ABNORMAL LOW (ref 150–400)
RBC: 5.19 MIL/uL — ABNORMAL HIGH (ref 3.87–5.11)
RDW: 13.8 % (ref 11.5–15.5)
WBC Count: 4.5 10*3/uL (ref 4.0–10.5)
nRBC: 0 % (ref 0.0–0.2)

## 2020-12-06 LAB — LACTATE DEHYDROGENASE: LDH: 198 U/L — ABNORMAL HIGH (ref 98–192)

## 2020-12-06 MED ORDER — SODIUM CHLORIDE (PF) 0.9 % IJ SOLN
INTRAMUSCULAR | Status: AC
  Filled 2020-12-06: qty 50

## 2020-12-06 MED ORDER — IOHEXOL 300 MG/ML  SOLN
75.0000 mL | Freq: Once | INTRAMUSCULAR | Status: AC | PRN
  Administered 2020-12-06: 60 mL via INTRAVENOUS

## 2020-12-08 ENCOUNTER — Encounter: Payer: Self-pay | Admitting: Internal Medicine

## 2020-12-08 ENCOUNTER — Inpatient Hospital Stay (HOSPITAL_BASED_OUTPATIENT_CLINIC_OR_DEPARTMENT_OTHER): Payer: Medicare Other | Admitting: Internal Medicine

## 2020-12-08 ENCOUNTER — Other Ambulatory Visit: Payer: Self-pay

## 2020-12-08 VITALS — BP 169/73 | HR 59 | Temp 97.9°F | Resp 20 | Ht 62.0 in | Wt 133.1 lb

## 2020-12-08 DIAGNOSIS — Z8572 Personal history of non-Hodgkin lymphomas: Secondary | ICD-10-CM | POA: Diagnosis not present

## 2020-12-08 NOTE — Progress Notes (Signed)
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  PRIOR THERAPY: Oncology History  Non-Hodgkins lymphoma [facility]) (Resolved)  03/27/2013 - 03/31/2013 Hospital Admission   Admitted to [facility] for profound anemia (Hgb 6.4).  C/o fevers, drenching nightsweats, weight lost.  Negative colonoscopy and EGD.  CT of C/A/P demonstrated significant lymphadenopathy suspcious of ympha.     03/31/2013 Initial Diagnosis   Biopsy of R inguinal lymph node revealed Non-Hodgkins lymphoma. Diffuse Large B-cell Lymphoma   04/10/2013 - 04/24/2013 Hospital Admission   Hypercalcemia, renal failure and dehydration.  Started min-RCHOP.    04/15/2013 Bone Marrow Biopsy   Involvement with Diffuse Large B-cell Lymphoma. Stage IV NHL w B-symptoms.    04/18/2013 - 04/18/2013 Chemotherapy   Mini R-CHOP Cycle #1 on 04/18/2013. Dose reduced to poor nutrion. It consisted on Cytoxan 400 mg/m2,Doxorubicin 25 mg/m2, Rituximab 375 mg/m2, Vincristine 72m.  Prednisone 100 mg daily for 9/13 - 9/16.   Received neupogen on 9/21, 9/27 -9/30.     04/25/2013 - 04/29/2013 Hospital Admission   Admitted due to worsening mouth sores/oral thrush, odynophagia, failure to thrive.  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  [facility]Dr[facility] CGwynneth Munson and discharged to home on 05/19/2013.     06/01/2013 - 06/01/2013 Chemotherapy   Mini R-CHO #2 dosed as above.  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  Received neulasta shot on 06/02/13   06/16/2013 Cancer Staging   Re-staging PET/CT demonstrated significant interval reduction in axillary lymphadenopathy.  There was 1 small residual right axillary lymph node measuring 16 x 12 mm which showed FDG uptake.  Signifcant  reduction in mediastinal and hilar lymphadenopathy.    06/16/2013 Imaging   CT of chest noted left-sided pulmonary embolim. Started on lovenox to coumadin bridge.     06/22/2013 - 06/22/2013 Chemotherapy   Mini-R-CHO #3.  Received Neulasta on 06/23/2013.     07/16/2013 Imaging   2-D Echocardiogram demonstrates 35-45% EF with Grade 2 diastolic dysfunction.  Doxorubicin discontinued with consultation by cardiology (Dr. KClaiborne Billings.     08/19/2013 - 08/21/2013 Chemotherapy   Mini-R-CEO #4.  Etoposide 25 mg/m2 instead of doxo due to above.  Etoposide 519mbid on day 2/3.  On day 3 (08/21/2013) she received 1 of two oral doses of ectoposide.    08/21/2013 - 08/26/2013 Hospital Admission   [facility] and had influenzae A and pneumonia. Completed tamiflu and oral antibiotics.    09/25/2013 - 09/27/2013 Chemotherapy   Mini-R-CEO #5. Etoposide 25 mg/m2 25 mg bid on days 2/3.    10/16/2013 - 10/19/2013 Chemotherapy   Mini-R-CEO # 6. Etoposide 25 mg/m2 bid on days 2/3. Neulasta on day#4.    11/05/2013 Imaging   Interval resolution of the hypermetabolism identified within the enlarged right axillary lymph node seen previously. This lymph node has also decreased markedly in size in the interval. No new hypermetabolic lesions in the neck, chest, abdomen, or  pelvis.    11/19/2013 Bone Marrow Biopsy   There is no evidence of a B-cell lymphoproliferative process in this material. Normal cytogenetics.    04/16/2014 Imaging   CT scan Chest, abdomen: No evidence of recurrent lymphoma within the chest or abdomen.   Significant decrease in splenomegaly since prior exam.  CURRENT THERAPY: Observation.  INTERVAL HISTORY: Tonya Wise 80 y.o. female returns to the clinic today for annual follow-up visit accompanied by her husband.  Unfortunately her husband was recently diagnosed with mantle cell lymphoma and he is currently receiving treatment under the care of Dr.  Irene Limbo.  She denied having any chest pain, shortness of breath, cough or hemoptysis.  She denied having any nausea, vomiting, diarrhea or constipation.  She has no weight loss or night sweats.  She has no bleeding issues.  The patient is here today for evaluation with repeat CT scan of the chest, abdomen pelvis for restaging of her disease.   MEDICAL HISTORY: Past Medical History:  Diagnosis Date  . Allergy   . Anemia   . Ankle fracture, left   . Chemotherapy induced cardiomyopathy ([place])    a. 08/2014: echo showing EF of 35-40% with Grade 2 DD and mild MR  . Diastolic dysfunction, grade I 04/12/2013  . GERD (gastroesophageal reflux disease)    Tums or Maalox  . Heart murmur   . Hypercalcemia 03/27/2013  . Hypertension   . Non-Hodgkins lymphoma ([place]) 04/06/2013  . Osteopenia   . Renal insufficiency   . Stroke [facility]) 2015   per pt while she was in hospital for Nonhodgkins Lymphoma    ALLERGIES:  is allergic to prednisone and penicillins.  MEDICATIONS:  Current Outpatient Medications  Medication Sig Dispense Refill  . alendronate (FOSAMAX) 70 MG tablet TAKE 1 TABLET BY MOUTH EVERY 7 DAYS. TAKE WITH A FULL GLASS OF WATER ON AN EMPTY STOMACH 12 tablet 0  . amLODipine (NORVASC) 2.5 MG tablet TAKE 1 TABLET BY MOUTH EVERY DAY (Patient taking differently: Take 2.5 mg by mouth daily.) 90 tablet 1  . aspirin EC 81 MG tablet Take 1 tablet (81 mg total) by mouth 2 (two) times daily. (Patient taking differently: Take 81 mg by mouth daily.) 90 tablet 0  . losartan (COZAAR) 100 MG tablet Take 1 tablet (100 mg total) by mouth daily. 30 tablet 6  . Melatonin 3 MG CAPS Take 3 mg by mouth at bedtime as needed (sleep).    . methocarbamol (ROBAXIN) 500 MG tablet Take 1 tablet (500 mg total) by mouth every 6 (six) hours as needed for muscle spasms. (Patient not taking: No sig reported) 40 tablet 3  . metoprolol succinate (TOPROL-XL) 100 MG 24 hr tablet Take 1 tablet (100 mg total) by mouth daily. Take with or  immediately following a meal. 90 tablet 3  . predniSONE (DELTASONE) 10 MG tablet Take 40 mg by mouth daily. (Patient not taking: No sig reported)    . RESTASIS 0.05 % ophthalmic emulsion Place 1 drop into both eyes 2 (two) times daily.      No current facility-administered medications for this visit.    SURGICAL HISTORY:  Past Surgical History:  Procedure Laterality Date  . ABDOMINAL HYSTERECTOMY    . ANTERIOR LAT LUMBAR FUSION N/A 06/28/2020   Procedure: Lumbar two-three Anterolateral lumbar interbody fusion with lateral plate;  Surgeon: Kristeen Miss, MD;  Location: [place];  Service: Neurosurgery;  Laterality: N/A;  . APPENDECTOMY    . BACK SURGERY     LOWER BACK TWICE  . COLONOSCOPY  2008  . ESOPHAGOGASTRODUODENOSCOPY N/A 03/29/2013   Procedure: ESOPHAGOGASTRODUODENOSCOPY (EGD);  Surgeon: Juanita Craver, MD;  Location[facility] ENDOSCOPY;  Service: Endoscopy;  Laterality: N/A;  . INGUINAL LYMPH NODE BIOPSY Right 03/31/2013   Procedure: INGUINAL LYMPH NODE BIOPSY;  Surgeon: Gwenyth Ober, MD;  Location: [facility] OR;  Service: General;  Laterality: Right;  . ORIF ANKLE FRACTURE Left 11/03/2015   Procedure: OPEN REDUCTION INTERNAL FIXATION (ORIF) LEFT ANKLE FRACTURE;  Surgeon: Wylene Simmer, MD;  Location: [place];  Service: Orthopedics;  Laterality: Left;  . SPINE SURGERY      REVIEW OF SYSTEMS:  A comprehensive review of systems was negative.   PHYSICAL EXAMINATION: General appearance: alert, cooperative and no distress Head: Normocephalic, without obvious abnormality, atraumatic Neck: no adenopathy, no JVD, supple, symmetrical, trachea midline and thyroid not enlarged, symmetric, no tenderness/mass/nodules Lymph nodes: Cervical, supraclavicular, and axillary nodes normal. Resp: clear to auscultation bilaterally Back: symmetric, no curvature. ROM normal. No CVA tenderness. Cardio: regular rate and rhythm, S1, S2 normal, no murmur, click, rub or gallop GI: soft, non-tender; bowel  sounds normal; no masses,  no organomegaly Extremities: extremities normal, atraumatic, no cyanosis or edema  ECOG PERFORMANCE STATUS: 1 - Symptomatic but completely ambulatory  Blood pressure (!) 169/73, pulse (!) 59, temperature 97.9 F (36.6 C), temperature source Tympanic, resp. rate 20, height _0  (1.575 m), weight 133 lb 1.6 oz (60.4 kg), SpO2 99 %.  LABORATORY DATA: Lab Results  Component Value Date   WBC 4.5 12/06/2020   HGB 13.9 12/06/2020   HCT 44.1 12/06/2020   MCV 85.0 12/06/2020   PLT 144 (L) 12/06/2020      Chemistry      Component Value Date/Time   NA 143 12/06/2020 0807   NA 139 12/05/2020 0949   NA 141 12/12/2016 0813   K 3.8 12/06/2020 0807   K 4.0 12/12/2016 0813   CL 104 12/06/2020 0807   CO2 28 12/06/2020 0807   CO2 26 12/12/2016 0813   BUN 19 12/06/2020 0807   BUN 16 12/05/2020 0949   BUN 20.3 12/12/2016 0813   CREATININE 1.41 (H) 12/06/2020 0807   CREATININE 1.46 (H) 08/05/2017 1639   CREATININE 1.3 (H) 12/12/2016 0813      Component Value Date/Time   CALCIUM 9.3 12/06/2020 0807   CALCIUM 8.9 12/12/2016 0813   ALKPHOS 54 12/06/2020 0807   ALKPHOS 55 12/12/2016 0813   AST 16 12/06/2020 0807   AST 13 12/12/2016 0813   ALT 8 12/06/2020 0807   ALT 10 12/12/2016 0813   BILITOT 0.5 12/06/2020 0807   BILITOT 0.55 12/12/2016 0813       RADIOGRAPHIC STUDIES: CT Chest W Contrast  Result Date: 12/06/2020 CLINICAL DATA:  History of lymphoma, follow-up. EXAM: CT CHEST, ABDOMEN, AND PELVIS WITH CONTRAST TECHNIQUE: Multidetector CT imaging of the chest, abdomen and pelvis was performed following the standard protocol during bolus administration of intravenous contrast. CONTRAST:  22m OMNIPAQUE IOHEXOL 300 MG/ML  SOLN COMPARISON:  Multiple priors including CT L-spine June 08, 2020 and CT chest abdomen and pelvis June 08, 2020. FINDINGS: CT CHEST FINDINGS Cardiovascular: Aortic and branch vessel atherosclerosis. No thoracic aortic aneurysm. No  central pulmonary embolus. Normal size heart. No significant pericardial effusion/thickening. Coronary artery calcifications. Mediastinum/Nodes: No pathologically enlarged mediastinal, hilar or axillary lymph nodes. Unchanged prominent precarinal lymph node measuring 6 mm on image 24/2. No discrete thyroid nodularity. Trachea esophagus are grossly unremarkable. Lungs/Pleura: No suspicious pulmonary nodules or masses. No pleural effusion. No pneumothorax. Musculoskeletal: Bilateral breast prostheses. Mild thoracic spondylosis. No acute osseous abnormality. CT ABDOMEN PELVIS FINDINGS Hepatobiliary: No suspicious hepatic lesion. Gallbladder is unremarkable. No biliary ductal dilation. Pancreas: Unremarkable. No pancreatic ductal dilatation or surrounding inflammatory changes. Spleen: Normal in size without focal abnormality. Adrenals/Urinary Tract: Bilateral  adrenal glands are unremarkable. No hydronephrosis. Symmetric enhancement excretion of contrast from bilateral kidneys. No suspicious filling defect visualized within the opacified portions of the collecting systems or proximal ureters on delayed imaging. No solid enhancing renal masses. Urinary bladder is predominantly decompressed limiting evaluation. Stomach/Bowel: No acute gastrointestinal process. Similar a haustral appearance of the rectosigmoid colon possibly related to prior colitis. No evidence of bowel obstruction. Vascular/Lymphatic: Aortic atherosclerosis without aneurysmal dilation. No pathologically enlarged abdominal or pelvic lymph nodes. Reproductive: Uterus and bilateral adnexa are unremarkable. Other: No abdominopelvic ascites. Musculoskeletal: Posterior spinal fusion from L3-L5 with unchanged L5 pars defects and grade 1 L5 on S1 anterolisthesis. Interval lateral L2-L3 spinal fusion with interbody disc spacer. No aggressive lytic or blastic lesion of bone. IMPRESSION: 1. No pathologically enlarged lymph nodes within the chest, abdomen, or pelvis  visualized to suggest disease recurrence. 2. Similar a haustral appearance of the rectosigmoid colon possibly related to prior colitis. 3. Aortic atherosclerosis. Aortic Atherosclerosis (ICD10-I70.0). Electronically Signed   By: Dahlia Bailiff MD   On: 12/06/2020 14:47   CT Abdomen Pelvis W Contrast  Result Date: 12/06/2020 CLINICAL DATA:  History of lymphoma, follow-up. EXAM: CT CHEST, ABDOMEN, AND PELVIS WITH CONTRAST TECHNIQUE: Multidetector CT imaging of the chest, abdomen and pelvis was performed following the standard protocol during bolus administration of intravenous contrast. CONTRAST:  19m OMNIPAQUE IOHEXOL 300 MG/ML  SOLN COMPARISON:  Multiple priors including CT L-spine June 08, 2020 and CT chest abdomen and pelvis June 08, 2020. FINDINGS: CT CHEST FINDINGS Cardiovascular: Aortic and branch vessel atherosclerosis. No thoracic aortic aneurysm. No central pulmonary embolus. Normal size heart. No significant pericardial effusion/thickening. Coronary artery calcifications. Mediastinum/Nodes: No pathologically enlarged mediastinal, hilar or axillary lymph nodes. Unchanged prominent precarinal lymph node measuring 6 mm on image 24/2. No discrete thyroid nodularity. Trachea esophagus are grossly unremarkable. Lungs/Pleura: No suspicious pulmonary nodules or masses. No pleural effusion. No pneumothorax. Musculoskeletal: Bilateral breast prostheses. Mild thoracic spondylosis. No acute osseous abnormality. CT ABDOMEN PELVIS FINDINGS Hepatobiliary: No suspicious hepatic lesion. Gallbladder is unremarkable. No biliary ductal dilation. Pancreas: Unremarkable. No pancreatic ductal dilatation or surrounding inflammatory changes. Spleen: Normal in size without focal abnormality. Adrenals/Urinary Tract: Bilateral adrenal glands are unremarkable. No hydronephrosis. Symmetric enhancement excretion of contrast from bilateral kidneys. No suspicious filling defect visualized within the opacified portions of the  collecting systems or proximal ureters on delayed imaging. No solid enhancing renal masses. Urinary bladder is predominantly decompressed limiting evaluation. Stomach/Bowel: No acute gastrointestinal process. Similar a haustral appearance of the rectosigmoid colon possibly related to prior colitis. No evidence of bowel obstruction. Vascular/Lymphatic: Aortic atherosclerosis without aneurysmal dilation. No pathologically enlarged abdominal or pelvic lymph nodes. Reproductive: Uterus and bilateral adnexa are unremarkable. Other: No abdominopelvic ascites. Musculoskeletal: Posterior spinal fusion from L3-L5 with unchanged L5 pars defects and grade 1 L5 on S1 anterolisthesis. Interval lateral L2-L3 spinal fusion with interbody disc spacer. No aggressive lytic or blastic lesion of bone. IMPRESSION: 1. No pathologically enlarged lymph nodes within the chest, abdomen, or pelvis visualized to suggest disease recurrence. 2. Similar a haustral appearance of the rectosigmoid colon possibly related to prior colitis. 3. Aortic atherosclerosis. Aortic Atherosclerosis (ICD10-I70.0). 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 The patient  is currently on observation and she is feeling fine today with no concerning complaints. She had repeat CT scan of the chest, abdomen pelvis performed recently that showed no concerning findings for disease recurrence or metastasis. I recommended for her to continue on observation with repeat CT scan of the chest, abdomen pelvis in 1 year. For the hypertension she is seeing her cardiologist next week for evaluation and adjustment of her medication. She was advised to call immediately if she  has any other concerning symptoms in the interval. The patient voices understanding of current disease status and treatment options and is in agreement with the current care plan. All questions were answered. The patient knows to call the clinic with any problems, questions or concerns. We can certainly see the patient much sooner if necessary.   Disclaimer: This note was dictated with voice recognition software. Similar sounding words can inadvertently be transcribed and may not be corrected upon review.

## 2020-12-12 ENCOUNTER — Other Ambulatory Visit: Payer: Self-pay

## 2020-12-12 ENCOUNTER — Ambulatory Visit (INDEPENDENT_AMBULATORY_CARE_PROVIDER_SITE_OTHER): Payer: Medicare Other | Admitting: Cardiovascular Disease

## 2020-12-12 ENCOUNTER — Encounter: Payer: Self-pay | Admitting: Cardiovascular Disease

## 2020-12-12 VITALS — BP 168/84 | HR 60 | Ht 63.0 in | Wt 133.6 lb

## 2020-12-12 DIAGNOSIS — I10 Essential (primary) hypertension: Secondary | ICD-10-CM

## 2020-12-12 DIAGNOSIS — Z8572 Personal history of non-Hodgkin lymphomas: Secondary | ICD-10-CM | POA: Diagnosis not present

## 2020-12-12 DIAGNOSIS — E782 Mixed hyperlipidemia: Secondary | ICD-10-CM | POA: Diagnosis not present

## 2020-12-12 DIAGNOSIS — N1832 Chronic kidney disease, stage 3b: Secondary | ICD-10-CM | POA: Diagnosis not present

## 2020-12-12 MED ORDER — AMLODIPINE BESYLATE 5 MG PO TABS
5.0000 mg | ORAL_TABLET | Freq: Every day | ORAL | 3 refills | Status: DC
Start: 2020-12-12 — End: 2021-12-11

## 2020-12-12 NOTE — Patient Instructions (Signed)
Medication Instructions:  Begin taking amlodipine 5mg  daily *If you need a refill on your cardiac medications before your next appointment, please call your pharmacy*   Lab Work: None ordered.    Testing/Procedures: None ordered   Follow-Up: At [facility], you and your health needs are our priority.  As part of our continuing mission to provide you with exceptional heart care, we have created designated Provider Care Teams.  These Care Teams include your primary Cardiologist (physician) and Advanced Practice Providers (APPs -  Physician Assistants and Nurse Practitioners) who all work together to provide you with the care you need, when you need it.  We recommend signing up for the patient portal called "MyChart".  Sign up information is provided on this After Visit Summary.  MyChart is used to connect with patients for Virtual Visits (Telemedicine).  Patients are able to view lab/test results, encounter notes, upcoming appointments, etc.  Non-urgent messages can be sent to your provider as well.   To learn more about what you can do with MyChart, go to [email or web address].    Your next appointment:   6 month(s)  The format for your next appointment:   In Person  Provider:   Shelva Majestic, MD

## 2020-12-12 NOTE — Progress Notes (Signed)
Cardiology Office Note    Date:  12/19/2020   ID:  Tonya Wise, Tonya Wise 01/08/1941, MRN [id number]  PCP:  Denita Lung, MD  Cardiologist:  Shelva Majestic, MD   4 month follow-up evaluation  History of Present Illness:  Tonya Wise is a 80 y.o. female who was diagnosed as having non-Hodgkin's lymphoma in 2014. She received 2 treatments of chemotherapy. On 06/16/2013 she underwent a followup CT scan which showed slight interval reduction in axillary lymphadenopathy but there was a 1 small residual right axillary lymph node measuring 16 a 12 mm. She also is noted a significant reduction in mediastinal and hilar adenopathy. However, it was noted that she had a left-sided pulmonary embolism. There also was resolution of bulky abdominal lymphadenopathy as well as interval decrease in the size of her spleen since initiating chemotherapy. Because of the diagnosis of left-sided pulmonary embolism she apparently started therapy with combination Lovenox as well as oral Coumadin. She apparently was seen in Dr. Lanice Shirts office on 06/17/2013 with complaints of increasing cough that has become productive. She was started on Zithromax for possible acute bronchitis. She was tachycardic and did have T-wave changes.   A 2-D echo Doppler study in September 2014 while she was hospitalized at [facility] demonstrated an ejection fraction was 50-55%. She had pulmonary hypertension with estimated RV systolic pressure at 39 mm and Grade 1 diastolic dysfunction.  When I initially saw her in November 2014, I  recommend that she undergo a nuclear perfusion study because of some  chest discomfort. Her nuclear perfusion study demonstrated normal perfusion, however ejection fraction was interpreted at 42%. A  followup echo Doppler study on 07/16/2013 confirmed LV dysfunction and ejection fraction was now 35-40%. There was diffuse hypokinesis. She now had grade 2 diastolic dysfunction. Due to her reduction of LV  function, subsequent chemotherapyhas been at a reduced dose. She completed cycle 5 of mini-R-CEO. She does have thrombocytopenia and when seen by her oncologist her platelets were 59,000. She is scheduled to undergo an additional week of recovery prior to undergoing a next course of chemotherapy.  She completed her last chemotherapy dose in March 2015.  Recently, she has had issues with her blood pressure being elevated.  She states her platelet count has improved and most recently this has been 122,000.  I saw her 3 months ago, her ECG revealed sinus bradycardia on her dose of metoprolol, tartrate at 37.5 twice a day, and I did not further increase this; however, with her previous diastolic dysfunction.  Iurther titrated her losartan to 100 mg daily.  She has been taking this 50 mg twice a day.  I recommended that she have a follow-up echo Doppler study which was done on 09/21/2014 and demonstrated an ejection fraction in the range of 35-40% with previously noted diffuse hypokinesis.  She again had a  pseudonormal left ventricular filling pattern consistent with grade 2 diastolic dysfunction.  There was mild mitral regurgitation.  She states when she was recently seen at the cancer center.  Her blood pressure was elevated.  However, she was anxious that they said she was having a follow-up CT scan.  She was evaluated by Bernerd Pho, PA-C in June 2018.  At that time she continued to feel well but did experience occasional fatigue but remained active.  In September 2019 she was evaluated by Fabian Sharp and prior to that evaluation was having some difficulty with blood pressure control.  During that evaluation she was  doing well on an increased dose of Lopressor 100 mg twice a day and losartan HCT 100/12.5 mg.  She has continued to be followed by Dr. Earlie Server for her diffuse large B cell non-Hodgkin's lymphoma.  She is felt to be stable.  She recently was seen by Dr. Jill Alexanders who is her primary  MD.  During his evaluation she was hypertensive and there was concern of some development of renal insufficiency.  Laboratory in May 2021 showed mild thrombocytopenia with a platelet count of 127.  Creatinine was 1.38 consistent with stage IIIb CKD.  After not having seen her since May 2016, I saw her on 03/30/2020 for cardiology evaluation.  At that time, she was doing well and remained active doing both gardening and yard work as well as housework.  She specifically denied any chest pain.  She did experience some mild shortness of breath particularly when walking uphill.  She had not had any recent assessment of LV function and presented for evaluation.  During that evaluation I recommended she undergo a follow-up echo Doppler study which was done on 04/13/2020.  This reveals EF at 55 to 60% with mild grade 1 diastolic dysfunction.  She did not have any regional wall motion abnormalities.  There was mild mitral regurgitation.  Ejection fraction had improved from February 2016 at which time it was noted to be 35 to 40%.  I saw her for follow-up on April 15, 2020.  She is not having any recurrent chest pain but was experiencing chronic low back discomfort.  Remotely she had undergone prior disc surgery with involving 4 discs.  She had had neurosurgery done in 2004 and 2005.  She has not been reevaluated by her neurosurgeon who no longer is in practice.  During that evaluation, I referred her to Dr. Kristeen Miss.  She apparently underwent back surgery by Dr. Ellene Route on June 28, 2020 for retrolisthesis of L2 on L3 with spinal stenosis and neurogenic claudication, lumbar radiculopathy with history of fusion L3-L5.  She tolerated surgery well from a cardiac standpoint.  I last saw her on August 24, 2020 and prior to that evaluation she underwent laboratory on August 17, 2020 which showed increased total cholesterol at 209, triglycerides 245, VLDL 42, and LDL 115.  Creatinine was 1.48.  She has been on  losartan HCT 100/12.5 mg, metoprolol succinate 100 mg daily, amlodipine 2.5 mg and takes as needed prednisone for episodes of acute diarrhea.  She also is on Fosamax.  During her evaluation her blood pressure was somewhat low.  Did she discontinue losartan HCT 100/12.5 and in its place recommended resumption of just losartan alone without the diuretic.  It studies revealed a mixed hyperlipidemic pattern.  We discussed a heart healthy diet and importance of reduction of carbohydrates and sweets.  I discussed if her blood pressure comes further elevated additional titration of amlodipine may be necessary.  Since I last saw her, she has been exercising and eating well.  Her blood pressure has increased.  Been taking amlodipine 2.5 mg, losartan 100 mg, metoprolol succinate 100 mg daily also takes prednisone on a as needed basis depending upon diarrhea prescribed by her GI physician.  She denies any anginal symptoms.  She is unaware of palpitations presyncope or syncope.  She presents for evaluation.  Past Medical History:  Diagnosis Date  . Allergy   . Anemia   . Ankle fracture, left   . Chemotherapy induced cardiomyopathy ([place])    a. 08/2014: echo showing EF  of 35-40% with Grade 2 DD and mild MR  . Diastolic dysfunction, grade I 04/12/2013  . GERD (gastroesophageal reflux disease)    Tums or Maalox  . Heart murmur   . Hypercalcemia 03/27/2013  . Hypertension   . Non-Hodgkins lymphoma ([place]) 04/06/2013  . Osteopenia   . Renal insufficiency   . Stroke [facility]) 2015   per pt while she was in hospital for Nonhodgkins Lymphoma    Past Surgical History:  Procedure Laterality Date  . ABDOMINAL HYSTERECTOMY    . ANTERIOR LAT LUMBAR FUSION N/A 06/28/2020   Procedure: Lumbar two-three Anterolateral lumbar interbody fusion with lateral plate;  Surgeon: Kristeen Miss, MD;  Location: [place];  Service: Neurosurgery;  Laterality: N/A;  . APPENDECTOMY    . BACK SURGERY     LOWER BACK TWICE  . COLONOSCOPY  2008   . ESOPHAGOGASTRODUODENOSCOPY N/A 03/29/2013   Procedure: ESOPHAGOGASTRODUODENOSCOPY (EGD);  Surgeon: Juanita Craver, MD;  Location[facility];  Service: Endoscopy;  Laterality: N/A;  . INGUINAL LYMPH NODE BIOPSY Right 03/31/2013   Procedure: INGUINAL LYMPH NODE BIOPSY;  Surgeon: Gwenyth Ober, MD;  Location: [place];  Service: General;  Laterality: Right;  . ORIF ANKLE FRACTURE Left 11/03/2015   Procedure: OPEN REDUCTION INTERNAL FIXATION (ORIF) LEFT ANKLE FRACTURE;  Surgeon: Wylene Simmer, MD;  Location: [place];  Service: Orthopedics;  Laterality: Left;  . SPINE SURGERY      Current Medications: Outpatient Medications Prior to Visit  Medication Sig Dispense Refill  . aspirin EC 81 MG tablet Take 1 tablet (81 mg total) by mouth 2 (two) times daily. (Patient taking differently: Take 81 mg by mouth daily.) 90 tablet 0  . losartan (COZAAR) 100 MG tablet Take 1 tablet (100 mg total) by mouth daily. 30 tablet 6  . Melatonin 3 MG CAPS Take 3 mg by mouth at bedtime as needed (sleep).    . predniSONE (DELTASONE) 10 MG tablet Take 40 mg by mouth daily.    . RESTASIS 0.05 % ophthalmic emulsion Place 1 drop into both eyes 2 (two) times daily.     Marland Kitchen alendronate (FOSAMAX) 70 MG tablet TAKE 1 TABLET BY MOUTH EVERY 7 DAYS. TAKE WITH A FULL GLASS OF WATER ON AN EMPTY STOMACH 12 tablet 0  . amLODipine (NORVASC) 2.5 MG tablet TAKE 1 TABLET BY MOUTH EVERY DAY (Patient taking differently: Take 2.5 mg by mouth daily.) 90 tablet 1  . metoprolol succinate (TOPROL-XL) 100 MG 24 hr tablet Take 1 tablet (100 mg total) by mouth daily. Take with or immediately following a meal. 90 tablet 3  . methocarbamol (ROBAXIN) 500 MG tablet Take 1 tablet (500 mg total) by mouth every 6 (six) hours as needed for muscle spasms. (Patient not taking: No sig reported) 40 tablet 3   No facility-administered medications prior to visit.     Allergies:   Prednisone and Penicillins   Social History   Socioeconomic History   . Marital status: Married    Spouse name: [name]  . Number of children: 2  . Years of education: 83  . Highest education level: Not on file  Occupational History    Employer: [employer]  Tobacco Use  . Smoking status: Never Smoker  . Smokeless tobacco: Never Used  Substance and Sexual Activity  . Alcohol use: No  . Drug use: No  . Sexual activity: Yes    Partners: Male  Other Topics Concern  . Not on file  Social History Narrative  Married.  Lives in [place] with husband.     Social Determinants of Health   Financial Resource Strain: Not on file  Food Insecurity: Not on file  Transportation Needs: Not on file  Physical Activity: Not on file  Stress: Not on file  Social Connections: Not on file    Social history is notable in that she is married for 60 years. She completed 12 grade of education. She is retired. She has 2 children, 60, and 56 and 2 grandchildren, 71 and 62.. No tobacco use presently. She quit remotely in 1962. There is no alcohol use. She does not routinely exercise.  Family History:  The patient's family history includes Diabetes in her brother and sister; Heart attack (age of onset: 45) in her father; Leukemia in her mother.   ROS General: Negative; No fevers, chills, or night sweats;  HEENT: Negative; No changes in vision or hearing, sinus congestion, difficulty swallowing Pulmonary: Negative; No cough, wheezing, shortness of breath, hemoptysis Cardiovascular: See HPI GI: History of diarrhea intermittently GU: Negative; No dysuria, hematuria, or difficulty voiding Musculoskeletal: Chronic low back discomfort which has progressed.  Status post 2 back surgeries 2004 and 2005.  Recent back surgery June 28, 2020 by Dr. Kristeen Miss Hematologic/Oncology: History of stage IV diffuse large B cell non-Hodgkin's lymphoma diagnosed in September 2014 Endocrine: Negative; no heat/cold intolerance; no diabetes Neuro: Negative; no changes in balance,  headaches Skin: Negative; No rashes or skin lesions Psychiatric: Negative; No behavioral problems, depression Sleep: Negative; No snoring, daytime sleepiness, hypersomnolence, bruxism, restless legs, hypnogognic hallucinations, no cataplexy Other comprehensive 14 point system review is negative.   PHYSICAL EXAM:   VS:  BP (!) 168/84   Pulse 60   Ht '5\' 3"'  (1.6 m)   Wt 133 lb 9.6 oz (60.6 kg)   SpO2 97%   BMI 23.67 kg/m     Blood pressure by me was 154/70  Wt Readings from Last 3 Encounters:  12/12/20 133 lb 9.6 oz (60.6 kg)  12/08/20 133 lb 1.6 oz (60.4 kg)  11/18/20 134 lb 12.8 oz (61.1 kg)    General: Alert, oriented, no distress.  Skin: normal turgor, no rashes, warm and dry HEENT: Normocephalic, atraumatic. Pupils equal round and reactive to light; sclera anicteric; extraocular muscles intact;  Nose without nasal septal hypertrophy Mouth/Parynx benign; Mallinpatti scale 2 Neck: No JVD, no carotid bruits; normal carotid upstroke Lungs: clear to ausculatation and percussion; no wheezing or rales Chest wall: without tenderness to palpitation Heart: PMI not displaced, RRR, s1 s2 normal, 1/6 systolic murmur, no diastolic murmur, no rubs, gallops, thrills, or heaves Abdomen: soft, nontender; no hepatosplenomehaly, BS+; abdominal aorta nontender and not dilated by palpation. Back: no CVA tenderness Pulses 2+ Musculoskeletal: full range of motion, normal strength, no joint deformities Extremities: no clubbing cyanosis or edema, Homan's sign negative  Neurologic: grossly nonfocal; Cranial nerves grossly wnl Psychologic: Normal mood and affect    Studies/Labs Reviewed:   ECG (independently read by me): NSR at 60; nonspecific T wave abnormality  08/24/2020 ECG (independently read by me): NSR at 63; NSSTT changes  03/30/2020 ECG (independently read by me): Sinus bradycardia at 58; QS V1-2; no ectopy, normal intervals  Recent Labs: BMP Latest Ref Rng & Units 12/06/2020 12/05/2020  08/17/2020  Glucose 70 - 99 mg/dL 111(H) 100(H) 99  BUN 8 - 23 mg/dL '19 16 18  ' Creatinine 0.44 - 1.00 mg/dL 1.41(H) 1.27(H) 1.48(H)  BUN/Creat Ratio 12 - 28 - 13 12  Sodium 135 -  145 mmol/L 143 139 144  Potassium 3.5 - 5.1 mmol/L 3.8 4.9 4.2  Chloride 98 - 111 mmol/L 104 101 103  CO2 22 - 32 mmol/L '28 25 24  ' Calcium 8.9 - 10.3 mg/dL 9.3 9.9 9.5     Hepatic Function Latest Ref Rng & Units 12/06/2020 12/05/2020 08/17/2020  Total Protein 6.5 - 8.1 g/dL 6.2(L) 6.0 6.3  Albumin 3.5 - 5.0 g/dL 3.8 4.2 4.4  AST 15 - 41 U/L '16 17 19  ' ALT 0 - 44 U/L '8 9 10  ' Alk Phosphatase 38 - 126 U/L 54 59 68  Total Bilirubin 0.3 - 1.2 mg/dL 0.5 0.5 0.5    CBC Latest Ref Rng & Units 12/06/2020 06/17/2020 04/05/2020  WBC 4.0 - 10.5 K/uL 4.5 5.1 4.8  Hemoglobin 12.0 - 15.0 g/dL 13.9 14.1 14.7  Hematocrit 36.0 - 46.0 % 44.1 44.4 45.2  Platelets 150 - 400 K/uL 144(L) 124(L) 156   Lab Results  Component Value Date   MCV 85.0 12/06/2020   MCV 86.7 06/17/2020   MCV 86 04/05/2020   Lab Results  Component Value Date   TSH 2.730 04/05/2020   Lab Results  Component Value Date   HGBA1C 5.4 01/14/2019     BNP    Component Value Date/Time   BNP 115.7 (H) 04/05/2020 0928   BNP 547.6 (H) 06/17/2013 1704    ProBNP    Component Value Date/Time   PROBNP 21,795.0 (H) 08/21/2013 1905     Lipid Panel     Component Value Date/Time   CHOL 177 12/05/2020 0949   TRIG 183 (H) 12/05/2020 0949   HDL 56 12/05/2020 0949   CHOLHDL 3.2 12/05/2020 0949   CHOLHDL 3.4 12/27/2016 1029   VLDL 28 12/27/2016 1029   LDLCALC 90 12/05/2020 0949   LABVLDL 31 12/05/2020 0949     RADIOLOGY: CT Chest W Contrast  Result Date: 12/06/2020 CLINICAL DATA:  History of lymphoma, follow-up. EXAM: CT CHEST, ABDOMEN, AND PELVIS WITH CONTRAST TECHNIQUE: Multidetector CT imaging of the chest, abdomen and pelvis was performed following the standard protocol during bolus administration of intravenous contrast. CONTRAST:  52m  OMNIPAQUE IOHEXOL 300 MG/ML  SOLN COMPARISON:  Multiple priors including CT L-spine June 08, 2020 and CT chest abdomen and pelvis June 08, 2020. FINDINGS: CT CHEST FINDINGS Cardiovascular: Aortic and branch vessel atherosclerosis. No thoracic aortic aneurysm. No central pulmonary embolus. Normal size heart. No significant pericardial effusion/thickening. Coronary artery calcifications. Mediastinum/Nodes: No pathologically enlarged mediastinal, hilar or axillary lymph nodes. Unchanged prominent precarinal lymph node measuring 6 mm on image 24/2. No discrete thyroid nodularity. Trachea esophagus are grossly unremarkable. Lungs/Pleura: No suspicious pulmonary nodules or masses. No pleural effusion. No pneumothorax. Musculoskeletal: Bilateral breast prostheses. Mild thoracic spondylosis. No acute osseous abnormality. CT ABDOMEN PELVIS FINDINGS Hepatobiliary: No suspicious hepatic lesion. Gallbladder is unremarkable. No biliary ductal dilation. Pancreas: Unremarkable. No pancreatic ductal dilatation or surrounding inflammatory changes. Spleen: Normal in size without focal abnormality. Adrenals/Urinary Tract: Bilateral adrenal glands are unremarkable. No hydronephrosis. Symmetric enhancement excretion of contrast from bilateral kidneys. No suspicious filling defect visualized within the opacified portions of the collecting systems or proximal ureters on delayed imaging. No solid enhancing renal masses. Urinary bladder is predominantly decompressed limiting evaluation. Stomach/Bowel: No acute gastrointestinal process. Similar a haustral appearance of the rectosigmoid colon possibly related to prior colitis. No evidence of bowel obstruction. Vascular/Lymphatic: Aortic atherosclerosis without aneurysmal dilation. No pathologically enlarged abdominal or pelvic lymph nodes. Reproductive: Uterus and bilateral adnexa are unremarkable. Other: No  abdominopelvic ascites. Musculoskeletal: Posterior spinal fusion from L3-L5  with unchanged L5 pars defects and grade 1 L5 on S1 anterolisthesis. Interval lateral L2-L3 spinal fusion with interbody disc spacer. No aggressive lytic or blastic lesion of bone. IMPRESSION: 1. No pathologically enlarged lymph nodes within the chest, abdomen, or pelvis visualized to suggest disease recurrence. 2. Similar a haustral appearance of the rectosigmoid colon possibly related to prior colitis. 3. Aortic atherosclerosis. Aortic Atherosclerosis (ICD10-I70.0). Electronically Signed   By: Dahlia Bailiff MD   On: 12/06/2020 14:47   CT Abdomen Pelvis W Contrast  Result Date: 12/06/2020 CLINICAL DATA:  History of lymphoma, follow-up. EXAM: CT CHEST, ABDOMEN, AND PELVIS WITH CONTRAST TECHNIQUE: Multidetector CT imaging of the chest, abdomen and pelvis was performed following the standard protocol during bolus administration of intravenous contrast. CONTRAST:  16m OMNIPAQUE IOHEXOL 300 MG/ML  SOLN COMPARISON:  Multiple priors including CT L-spine June 08, 2020 and CT chest abdomen and pelvis June 08, 2020. FINDINGS: CT CHEST FINDINGS Cardiovascular: Aortic and branch vessel atherosclerosis. No thoracic aortic aneurysm. No central pulmonary embolus. Normal size heart. No significant pericardial effusion/thickening. Coronary artery calcifications. Mediastinum/Nodes: No pathologically enlarged mediastinal, hilar or axillary lymph nodes. Unchanged prominent precarinal lymph node measuring 6 mm on image 24/2. No discrete thyroid nodularity. Trachea esophagus are grossly unremarkable. Lungs/Pleura: No suspicious pulmonary nodules or masses. No pleural effusion. No pneumothorax. Musculoskeletal: Bilateral breast prostheses. Mild thoracic spondylosis. No acute osseous abnormality. CT ABDOMEN PELVIS FINDINGS Hepatobiliary: No suspicious hepatic lesion. Gallbladder is unremarkable. No biliary ductal dilation. Pancreas: Unremarkable. No pancreatic ductal dilatation or surrounding inflammatory changes. Spleen:  Normal in size without focal abnormality. Adrenals/Urinary Tract: Bilateral adrenal glands are unremarkable. No hydronephrosis. Symmetric enhancement excretion of contrast from bilateral kidneys. No suspicious filling defect visualized within the opacified portions of the collecting systems or proximal ureters on delayed imaging. No solid enhancing renal masses. Urinary bladder is predominantly decompressed limiting evaluation. Stomach/Bowel: No acute gastrointestinal process. Similar a haustral appearance of the rectosigmoid colon possibly related to prior colitis. No evidence of bowel obstruction. Vascular/Lymphatic: Aortic atherosclerosis without aneurysmal dilation. No pathologically enlarged abdominal or pelvic lymph nodes. Reproductive: Uterus and bilateral adnexa are unremarkable. Other: No abdominopelvic ascites. Musculoskeletal: Posterior spinal fusion from L3-L5 with unchanged L5 pars defects and grade 1 L5 on S1 anterolisthesis. Interval lateral L2-L3 spinal fusion with interbody disc spacer. No aggressive lytic or blastic lesion of bone. IMPRESSION: 1. No pathologically enlarged lymph nodes within the chest, abdomen, or pelvis visualized to suggest disease recurrence. 2. Similar a haustral appearance of the rectosigmoid colon possibly related to prior colitis. 3. Aortic atherosclerosis. Aortic Atherosclerosis (ICD10-I70.0). Electronically Signed   By: JDahlia BailiffMD   On: 12/06/2020 14:47     Additional studies/ records that were reviewed today include:  I reviewed the patient's record since my last evaluation over 5 years ago  ASSESSMENT:    1. Essential hypertension   2. Stage 3b chronic kidney disease ([place]   3. Mixed hyperlipidemia   4. History of B-cell lymphoma    PLAN:  Ms. RLynnox Girtenis a very pleasant 80year old young appearing female who has a history of non-Hodgkin's lymphoma initially diagnosed in September 2014.  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  Her lymphoma fortunately has stabilized. A CT in May 2021 did not demonstrate any residual evidence for recurrent lymphoma within the chest, abdomen or pelvis.  She has had issues  with hypertension.  A prior echo Doppler study in February 2016 showed an EF of 35 to 40%.  When I saw her on 03/30/2020 after not having seen her since 2016, her blood pressure was elevated.  With her previous LV dysfunction I recommended she change from metoprolol tartrate 100 mg twice a day to metoprolol succinate 100 mg daily.  Her echo Doppler study from 04/13/2020 demonstrated normalization of LV function with only mild grade 1 diastolic dysfunction.  During her last evaluation on April 15, 2020 amlodipine was added at low-dose to her medical regimen for continued blood pressure elevation.  At that time she was having significant low back discomfort issues and she was referred by me to Dr. Kristeen Miss and she ultimately underwent neurosurgery for retrolisthesis of L2 on L3 with spinal stenosis neurogenic claudication and lumbar radiculopathy.  She tolerated surgery well.  Presently today, blood pressure on repeat by me is somewhat low.  Review of recent laboratory demonstrates stage IIIb CKD with her creatinine at 1.48.  When I last evaluated her in January 2022 her blood pressure was low and HCT  was discontinued and she was maintained on losartan alone in addition to her low-dose amlodipine and metoprolol.  Subsequently, her blood pressure has increased again.  I have recommended slight titration today of amlodipine to 5 mg daily.  She has worked hard in improving her diet.  She is exercising regularly and eating well.  Her ECG remained stable with nonspecific ST changes.  Remains active and pushes her lawnmower and denies any anginal symptoms.  Lipid studies on Dec 05, 2020 showed significant improvement in triglycerides from 245  down to 193 but these still remain slightly elevated.  Her LDL cholesterol has improved from 115 down to 90.  She will continue dietary adjustment.  She has stage IIIb CKD and most recent laboratory has shown improvement of her creatinine from 1.49 down to 1.27 with estimated GFR at 43.  I will see her in 6 months for reevaluation or sooner as needed.     Medication Adjustments/Labs and Tests Ordered: Current medicines are reviewed at length with the patient today.  Concerns regarding medicines are outlined above.  Medication changes, Labs and Tests ordered today are listed in the Patient Instructions below. Patient Instructions  Medication Instructions:  Begin taking amlodipine 66m daily *If you need a refill on your cardiac medications before your next appointment, please call your pharmacy*   Lab Work: None ordered.    Testing/Procedures: None ordered   Follow-Up: [facility] and your [facility] needs are our priority.  As part of our continuing mission to provide you with exceptional heart care, we have created designated Provider Care Teams.  These Care Teams include your primary Cardiologist (physician) and Advanced Practice Providers (APPs -  Physician Assistants and Nurse Practitioners) who all work together to provide you with the care you need, when you need it.  We recommend signing up for the patient portal called "MyChart".  Sign up information is provided on this After Visit Summary.  MyChart is used to connect with patients for Virtual Visits (Telemedicine).  Patients are able to view lab/test results, encounter notes, upcoming appointments, etc.  Non-urgent messages can be sent to your provider as well.   To learn more about what you can do with MyChart, go to [email or web address]    Your next appointment:   6 month(s)  The format for your next appointment:   In Person  Provider:  Shelva Majestic, MD        Signed, Shelva Majestic, MD  12/19/2020 7:47  PM    [facility] [address], [place], [place], [place]  [address] Phone: [phone]

## 2020-12-13 ENCOUNTER — Other Ambulatory Visit: Payer: Self-pay | Admitting: Family Medicine

## 2020-12-13 DIAGNOSIS — M810 Age-related osteoporosis without current pathological fracture: Secondary | ICD-10-CM

## 2020-12-18 ENCOUNTER — Other Ambulatory Visit: Payer: Self-pay | Admitting: Cardiovascular Disease

## 2020-12-19 ENCOUNTER — Encounter: Payer: Self-pay | Admitting: Cardiovascular Disease

## 2021-02-24 ENCOUNTER — Other Ambulatory Visit: Payer: Self-pay | Admitting: Cardiovascular Disease

## 2021-03-05 ENCOUNTER — Other Ambulatory Visit: Payer: Self-pay | Admitting: Family Medicine

## 2021-03-05 DIAGNOSIS — M810 Age-related osteoporosis without current pathological fracture: Secondary | ICD-10-CM

## 2021-03-12 NOTE — Progress Notes (Signed)
Tonya Wise is a 80 y.o. female who presents for annual wellness visit and follow-up on chronic medical conditions.  She is being followed by Dr. Benson Norway for diarrheal symptoms and apparently is using prednisone on an as-needed basis for control of the diarrhea.  She does have a history of glucose intolerance as well as CKD.  She also has reflux disease but is having no difficulty with that.  She follows up regularly with cardiology because of her underlying cardiomyopathy.  Is doing well on losartan, amlodipineAnd metoprolol.  She continues on Fosamax and is having no difficulty with that.  She recently had a scan which did show evidence of aortic atherosclerosis.  She follows up regularly with oncology concerning her lymphoma.  She has had no recent difficulty with back pain.  Immunizations and Health Maintenance Immunization History  Administered Date(s) Administered   Fluad Quad(high Dose 65+) 04/09/2019   Influenza Split 04/12/2011, 04/25/2012   Influenza, High Dose Seasonal PF 04/21/2014, 04/07/2015, 04/24/2016, 04/22/2017, 04/17/2018, 04/17/2020   Influenza,inj,Quad PF,6+ Mos 04/11/2013   PFIZER(Purple Top)SARS-COV-2 Vaccination 09/13/2019, 10/06/2019, 05/02/2020   Pneumococcal Conjugate-13 12/27/2016   Pneumococcal Polysaccharide-23 04/15/2006, 04/11/2013   Tdap 07/17/2014   Zoster Recombinat (Shingrix) 09/05/2017, 11/23/2017   Zoster, Live 04/15/2006   Health Maintenance Due  Topic Date Due   COVID-19 Vaccine (4 - Booster for Pfizer series) 08/02/2020   INFLUENZA VACCINE  02/27/2021    Last Pap smear: aged out  Last mammogram: aged out  Last colonoscopy:10/11/11 Last DEXA: 01/25/2017 Dentist: Q six months  Ophtho: Q year Exercise: yard work three days a week  Other doctors caring for patient include: Dr. Claiborne Billings cardiology, Dr. Ellene Route neuro, Dr. Julien Nordmann oncology   Advanced directives: Does Patient Have a Medical Advance Directive?: No Would patient like information on creating  a medical advance directive?: Yes (ED - Information included in AVS)  Depression screen:  See questionnaire below.  Depression screen [facility] 2/9 03/13/2021 03/09/2020 01/14/2019 01/17/2018 12/27/2016  Decreased Interest 0 0 0 0 0  Down, Depressed, Hopeless 0 0 0 0 0  PHQ - 2 Score 0 0 0 0 0  Some recent data might be hidden    Fall Risk Screen: see questionnaire below. Fall Risk  03/13/2021 03/09/2020 01/14/2019 01/17/2018 12/27/2016  Falls in the past year? 0 0 0 No No  Number falls in past yr: 0 - - - -  Injury with Fall? 0 - - - -  Risk for fall due to : No Fall Risks No Fall Risks - - -  Follow up Falls evaluation completed - - - -    ADL screen:  See questionnaire below Functional Status Survey: Is the patient deaf or have difficulty hearing?: No Does the patient have difficulty seeing, even when wearing glasses/contacts?: No Does the patient have difficulty concentrating, remembering, or making decisions?: No Does the patient have difficulty walking or climbing stairs?: No Does the patient have difficulty dressing or bathing?: No Does the patient have difficulty doing errands alone such as visiting a doctor's office or shopping?: No   Review of Systems Constitutional: -, -unexpected weight change, -anorexia, -fatigue Allergy: -sneezing, -itching, -congestion Dermatology: denies changing moles, rash, lumps ENT: -runny nose, -ear pain, -sore throat,  Cardiology:  -chest pain, -palpitations, -orthopnea, Respiratory: -cough, -shortness of breath, -dyspnea on exertion, -wheezing,  Gastroenterology: -abdominal pain, -nausea, -vomiting, -diarrhea, -constipation, -dysphagia Hematology: -bleeding or bruising problems Musculoskeletal: -arthralgias, -myalgias, -joint swelling, -back pain, - Ophthalmology: -vision changes,  Urology: -dysuria, -difficulty urinating,  -  urinary frequency, -urgency, incontinence Neurology: -, -numbness, , -memory loss, -falls, -dizziness    PHYSICAL EXAM:  BP  (!) 148/72   Pulse 63   Temp (!) 96 F (35.6 C)   Ht 5' 2.25" (1.581 m)   Wt 132 lb (59.9 kg)   SpO2 96%   BMI 23.95 kg/m   General Appearance: Alert, cooperative, no distress, appears stated age Head: Normocephalic, without obvious abnormality, atraumatic Eyes: PERRL, conjunctiva/corneas clear, EOM's intact, Ears: Normal TM's and external ear canals Nose: Nares normal, mucosa normal, no drainage or sinus tenderness Throat: Lips, mucosa, and tongue normal; teeth and gums normal Neck: Supple, no lymphadenopathy;  thyroid:  no enlargement/tenderness/nodules; no carotid bruit or JVD Lungs: Clear to auscultation bilaterally without wheezes, rales or ronchi; respirations unlabored Heart: Regular rate and rhythm, S1 and S2 normal, no murmur, rubor gallop Skin:  Skin color, texture, turgor normal, no rashes or lesions Lymph nodes: Cervical, supraclavicular, and axillary nodes normal Neurologic:  CNII-XII intact, normal strength, sensation and gait; reflexes 2+ and symmetric throughout Psych: Normal mood, affect, hygiene and grooming.  ASSESSMENT/PLAN: Chronic combined systolic and diastolic heart failure (HCC)  Thrombocytopenia (HCC)  Essential hypertension  Hyperlipidemia, unspecified hyperlipidemia type - Plan: atorvastatin (LIPITOR) 20 MG tablet  History of B-cell lymphoma  Age-related osteoporosis without current pathological fracture  Gastroesophageal reflux disease without esophagitis  Chemotherapy induced cardiomyopathy (HCC)  Stage 3b chronic kidney disease (HCC)  Glucose intolerance  Lumbar stenosis with neurogenic claudication  Immunization, viral disease - Plan: PFIZER Comirnaty(GRAY TOP)COVID-19 Vaccine  Aortic atherosclerosis ([place]) - Plan: atorvastatin (LIPITOR) 20 MG tablet She will continue on her present medication regimen as well as follow-up with oncology and cardiology.  Discussed flu shot and further COVID vaccinations.  Also discussed proper hydration  and lieu of her kidney disease.   Immunization recommendations discussed.  Colonoscopy recommendations reviewed   Medicare Attestation I have personally reviewed: The patient's medical and social history Their use of alcohol, tobacco or illicit drugs Their current medications and supplements The patient's functional ability including ADLs,fall risks, home safety risks, cognitive, and hearing and visual impairment Diet and physical activities Evidence for depression or mood disorders  The patient's weight, height, and BMI have been recorded in the chart.  I have made referrals, counseling, and provided education to the patient based on review of the above and I have provided the patient with a written personalized care plan for preventive services.     Jill Alexanders, MD   03/13/2021

## 2021-03-13 ENCOUNTER — Ambulatory Visit (INDEPENDENT_AMBULATORY_CARE_PROVIDER_SITE_OTHER): Payer: Medicare Other | Admitting: Family Medicine

## 2021-03-13 ENCOUNTER — Other Ambulatory Visit: Payer: Self-pay

## 2021-03-13 VITALS — BP 148/72 | HR 63 | Temp 96.0°F | Ht 62.25 in | Wt 132.0 lb

## 2021-03-13 DIAGNOSIS — M810 Age-related osteoporosis without current pathological fracture: Secondary | ICD-10-CM | POA: Diagnosis not present

## 2021-03-13 DIAGNOSIS — M48062 Spinal stenosis, lumbar region with neurogenic claudication: Secondary | ICD-10-CM

## 2021-03-13 DIAGNOSIS — E785 Hyperlipidemia, unspecified: Secondary | ICD-10-CM | POA: Diagnosis not present

## 2021-03-13 DIAGNOSIS — I5042 Chronic combined systolic (congestive) and diastolic (congestive) heart failure: Secondary | ICD-10-CM

## 2021-03-13 DIAGNOSIS — Z0000 Encounter for general adult medical examination without abnormal findings: Secondary | ICD-10-CM

## 2021-03-13 DIAGNOSIS — D696 Thrombocytopenia, unspecified: Secondary | ICD-10-CM | POA: Diagnosis not present

## 2021-03-13 DIAGNOSIS — E7439 Other disorders of intestinal carbohydrate absorption: Secondary | ICD-10-CM | POA: Diagnosis not present

## 2021-03-13 DIAGNOSIS — I10 Essential (primary) hypertension: Secondary | ICD-10-CM | POA: Diagnosis not present

## 2021-03-13 DIAGNOSIS — Z8572 Personal history of non-Hodgkin lymphomas: Secondary | ICD-10-CM | POA: Diagnosis not present

## 2021-03-13 DIAGNOSIS — Z23 Encounter for immunization: Secondary | ICD-10-CM

## 2021-03-13 DIAGNOSIS — I700 Atherosclerosis of aorta: Secondary | ICD-10-CM | POA: Insufficient documentation

## 2021-03-13 DIAGNOSIS — K219 Gastro-esophageal reflux disease without esophagitis: Secondary | ICD-10-CM

## 2021-03-13 DIAGNOSIS — N1832 Chronic kidney disease, stage 3b: Secondary | ICD-10-CM

## 2021-03-13 DIAGNOSIS — I427 Cardiomyopathy due to drug and external agent: Secondary | ICD-10-CM

## 2021-03-13 DIAGNOSIS — T451X5A Adverse effect of antineoplastic and immunosuppressive drugs, initial encounter: Secondary | ICD-10-CM

## 2021-03-13 MED ORDER — ATORVASTATIN CALCIUM 20 MG PO TABS: 20.0000 mg | ORAL_TABLET | Freq: Every day | ORAL | 4 refills | Status: DC

## 2021-03-25 ENCOUNTER — Other Ambulatory Visit: Payer: Self-pay | Admitting: Cardiovascular Disease

## 2021-03-28 ENCOUNTER — Telehealth: Payer: Self-pay | Admitting: Cardiovascular Disease

## 2021-03-28 MED ORDER — METOPROLOL SUCCINATE ER 100 MG PO TB24: 100.0000 mg | ORAL_TABLET | Freq: Every day | ORAL | 1 refills | Status: DC

## 2021-03-28 NOTE — Telephone Encounter (Signed)
*  STAT* If patient is at the pharmacy, call can be transferred to refill team.   1. Which medications need to be refilled? (please list name of each medication and dose if known) metoprolol succinate (TOPROL-XL) 100 MG 24 hr tablet (Expired)  2. Which pharmacy/location (including street and city if local pharmacy) is medication to be sent to? [id number] [place] [place] - [address]  3. Do they need a 30 day or 90 day supply? 90 day supply

## 2021-04-20 ENCOUNTER — Telehealth: Payer: Self-pay | Admitting: Family Medicine

## 2021-04-20 NOTE — Chronic Care Management (AMB) (Signed)
  Chronic Care Management   Note  04/20/2021 Name: Tonya Wise MRN: [id number] DOB: 1941-02-25  Tonya Wise is a 80 y.o. year old female who is a primary care patient of [employer], Elyse Jarvis, MD. I reached out to Eber Hong by phone today in response to a referral sent by [name]'s PCP, Denita Lung, MD.   [name] was given information about Chronic Care Management services today including:  CCM service includes personalized support from designated clinical staff supervised by her physician, including individualized plan of care and coordination with other care providers 24/7 contact phone numbers for assistance for urgent and routine care needs. Service will only be billed when office clinical staff spend 20 minutes or more in a month to coordinate care. Only one practitioner may furnish and bill the service in a calendar month. The patient may stop CCM services at any time (effective at the end of the month) by phone call to the office staff.   Patient agreed to services and verbal consent obtained.   Follow up plan:   Tatjana Secretary/administrator

## 2021-05-04 ENCOUNTER — Other Ambulatory Visit: Payer: Medicare Other

## 2021-05-09 ENCOUNTER — Other Ambulatory Visit (INDEPENDENT_AMBULATORY_CARE_PROVIDER_SITE_OTHER): Payer: Medicare Other

## 2021-05-09 ENCOUNTER — Other Ambulatory Visit: Payer: Self-pay

## 2021-05-09 DIAGNOSIS — Z23 Encounter for immunization: Secondary | ICD-10-CM

## 2021-05-19 ENCOUNTER — Telehealth: Payer: Medicare Other | Admitting: Physician Assistant

## 2021-05-19 DIAGNOSIS — J020 Streptococcal pharyngitis: Secondary | ICD-10-CM

## 2021-05-19 MED ORDER — AZITHROMYCIN 250 MG PO TABS: ORAL_TABLET | ORAL | 0 refills | Status: DC

## 2021-05-19 NOTE — Progress Notes (Signed)
E-Visit for Sore Throat - Strep Symptoms  We are sorry that you are not feeling well.  Here is how we plan to help!  Based on what you have shared with me it is likely that you have strep pharyngitis.  Strep pharyngitis is inflammation and infection in the back of the throat.  This is an infection cause by bacteria and is treated with antibiotics.  I have prescribed Azithromycin 250 mg two tablets today and then one daily for 4 additional days. For throat pain, we recommend over the counter oral pain relief medications such as acetaminophen or aspirin, or anti-inflammatory medications such as ibuprofen or naproxen sodium. Topical treatments such as oral throat lozenges or sprays may be used as needed. Strep infections are not as easily transmitted as other respiratory infections, however we still recommend that you avoid close contact with loved ones, especially the very young and elderly.  Remember to wash your hands thoroughly throughout the day as this is the number one way to prevent the spread of infection and wipe down door knobs and counters with disinfectant.   Home Care: Only take medications as instructed by your medical team. Complete the entire course of an antibiotic. Do not take these medications with alcohol. A steam or ultrasonic humidifier can help congestion.  You can place a towel over your head and breathe in the steam from hot water coming from a faucet. Avoid close contacts especially the very young and the elderly. Cover your mouth when you cough or sneeze. Always remember to wash your hands.  Get Help Right Away If: You develop worsening fever or sinus pain. You develop a severe head ache or visual changes. Your symptoms persist after you have completed your treatment plan.  Make sure you Understand these instructions. Will watch your condition. Will get help right away if you are not doing well or get worse.   Thank you for choosing an e-visit.  Your e-visit  answers were reviewed by a board certified advanced clinical practitioner to complete your personal care plan. Depending upon the condition, your plan could have included both over the counter or prescription medications.  Please review your pharmacy choice. Make sure the pharmacy is open so you can pick up prescription now. If there is a problem, you may contact your provider through MyChart messaging and have the prescription routed to another pharmacy.  Your safety is important to us. If you have drug allergies check your prescription carefully.   For the next 24 hours you can use MyChart to ask questions about today's visit, request a non-urgent call back, or ask for a work or school excuse. You will get an email in the next two days asking about your experience. I hope that your e-visit has been valuable and will speed your recovery.  I provided 5 minutes of non face-to-face time during this encounter for chart review and documentation.   

## 2021-05-30 ENCOUNTER — Telehealth: Payer: Self-pay | Admitting: Pharmacist

## 2021-05-30 NOTE — Chronic Care Management (AMB) (Signed)
Chronic Care Management Pharmacy Assistant   Name: Tonya Wise  MRN: [id number] DOB: [birth year]  Tonya Wise is an 80 y.o. year old female who presents for her initial CCM visit with the clinical pharmacist.  Reason for Encounter: Chart Prep for initial visit with Jeni Salles, Pharm D on 06/01/21.   Conditions to be addressed/monitored: HTN, HLD, CKD Stage 3b, GERD, Osteoporosis, and Chronic combined systolic and diastolic heart failure, aortic atherosclerosis, chemotherapy induced cardiomyopathy, thrombocytopenia, glucose intolerance and lumbar stenosis.    Recent office visits:  03/13/21 Jill Alexanders MD (PCP) - seen for chronic combined systolic and diastolic heart failure and other chronic conditions. Patient started on atorvastatin 20mg  daily. Keep all scheduled follow up appointments.   Recent consult visits:  12/12/20 Shelva Majestic MD (Cardiology) - seen for hypertension and other issues.  Discontinued methocarbamol 500mg  every 6 hours as needed.   12/08/20 Curt Bears MD (Oncology) - seen for history of B - cell lymphoma. Discontinued aspirin-caffeine 400 - 32mg . Follow up in 1 year.  Hospital visits:  None in previous 6 months  Medications: Outpatient Encounter Medications as of 05/30/2021  Medication Sig Note   alendronate (FOSAMAX) 70 MG tablet TAKE 1 TABLET BY MOUTH EVERY 7 DAYS. TAKE WITH A FULL GLASS OF WATER ON AN EMPTY STOMACH    amLODipine (NORVASC) 5 MG tablet Take 1 tablet (5 mg total) by mouth daily.    aspirin EC 81 MG tablet Take 1 tablet (81 mg total) by mouth 2 (two) times daily. (Patient taking differently: Take 81 mg by mouth daily.)    atorvastatin (LIPITOR) 20 MG tablet Take 1 tablet (20 mg total) by mouth daily.    azithromycin (ZITHROMAX) 250 MG tablet Take 2 tablets PO on day one, and one tablet PO daily thereafter until completed.    losartan (COZAAR) 100 MG tablet TAKE 1 TABLET BY MOUTH EVERY DAY    Melatonin 3 MG CAPS Take 3 mg by mouth at  bedtime as needed (sleep).    metoprolol succinate (TOPROL-XL) 100 MG 24 hr tablet Take 1 tablet (100 mg total) by mouth daily. Take with or immediately following a meal.    predniSONE (DELTASONE) 10 MG tablet Take 40 mg by mouth daily. (Patient not taking: Reported on 03/13/2021) 12/08/2020: Pt states she takes 5 mg/day   RESTASIS 0.05 % ophthalmic emulsion Place 1 drop into both eyes 2 (two) times daily.     No facility-administered encounter medications on file as of 05/30/2021.   Fill History: ALENDRONATE SODIUM 70 MG TAB 03/06/2021 90   ATORVASTATIN 20 MG TABLET 03/13/2021 90   LOSARTAN POTASSIUM 100 MG TAB 05/25/2021 90   METOPROLOL SUCC ER 100 MG TAB 03/28/2021 90   AMLODIPINE BESYLATE 5 MG TAB 03/16/2021 90   RESTASIS 0.05% EYE EMULSION 04/12/2021 30   Initial Questions: Have you seen any other providers since your last visit? No.   Any changes in your medications or health? No.   Any side effects from any medications? Not that patient can think of.   Do you have any symptoms or problems not managed by your medications? No. Patient feels everything is working great for her.   Any concerns about your health right now? Not at this time.   Has your provider asked that you check blood pressure, blood sugar, or follow special diet at home? No.   Do you get any type of exercise on a regular basis? Yes. Patient does all her yardwork. She push  mows and weed eats as well as maintaining a garden. She also does all the cleaning and cooking at home as well.  Can you think of a goal you would like to reach for your health? Patient would like to maintain her current health status.  Do you have any problems getting your medications? No issues with cost or pharmacy at this time.   Is there anything that you would like to discuss during the appointment? No that patient can think of.   Patient was given a verbal appointment reminder of date and time while on phone. Patient reminded to  please bring medications and supplements to appointment.  Care Gaps:  AWV - scheduled for 03/14/22 COVID-19 vaccine booster 5 - overdue since 05/08/21 Last PCP BP: 148/72 P: 63 on 03/13/21  Star Rating Drugs:  Atorvastatin 20mg  - [facility] Losartan [facility]  - last filled on 05/25/21 90DS at [facility] Pharmacist Assistant [phone]

## 2021-06-01 ENCOUNTER — Ambulatory Visit (INDEPENDENT_AMBULATORY_CARE_PROVIDER_SITE_OTHER): Payer: Medicare Other | Admitting: Pharmacist

## 2021-06-01 ENCOUNTER — Telehealth: Payer: Self-pay | Admitting: Family Medicine

## 2021-06-01 DIAGNOSIS — M810 Age-related osteoporosis without current pathological fracture: Secondary | ICD-10-CM

## 2021-06-01 DIAGNOSIS — E785 Hyperlipidemia, unspecified: Secondary | ICD-10-CM

## 2021-06-01 DIAGNOSIS — I10 Essential (primary) hypertension: Secondary | ICD-10-CM

## 2021-06-01 NOTE — Telephone Encounter (Signed)
-----   Message from Viona Gilmore, [facility] sent at 06/01/2021 11:21 AM EDT ----- Regarding: Repeat DEXA Hi,  It looks like Ms. Galiano last DEXA was in 2018. Would you be able to order a repeat one as she is beyond the 2-3 year recommendation? Also, when you place this order how does she schedule it? I have not had to request one so far here so I am not sure of the protocol.  Let me know!  Thanks, Maddie

## 2021-06-01 NOTE — Progress Notes (Signed)
Chronic Care Management Pharmacy Note  06/01/2021 Name:  Tonya Wise MRN:  [id number] DOB:  Dec 15, 1940  Summary: BP is not at goal < 140/90 LDL is not at goal < 70  Recommendations/Changes made from today's visit: -Recommended starting atorvastatin as patient had not started taking -Recommended calcium citrate with vitamin D 600 mg twice daily  -Recommended weekly monitoring of BP at home -Recommend repeat DEXA  Plan: Follow up BP assessment in 1-2 months Mail Restasis PAP   Subjective: Tonya Wise is an 80 y.o. year old female who is a primary patient of Denita Lung, MD.  The CCM team was consulted for assistance with disease management and care coordination needs.    Engaged with patient by telephone for initial visit in response to provider referral for pharmacy case management and/or care coordination services.   Consent to Services:  The patient was given the following information about Chronic Care Management services today, agreed to services, and gave verbal consent: 1. CCM service includes personalized support from designated clinical staff supervised by the primary care provider, including individualized plan of care and coordination with other care providers 2. 24/7 contact phone numbers for assistance for urgent and routine care needs. 3. Service will only be billed when office clinical staff spend 20 minutes or more in a month to coordinate care. 4. Only one practitioner may furnish and bill the service in a calendar month. 5.The patient may stop CCM services at any time (effective at the end of the month) by phone call to the office staff. 6. The patient will be responsible for cost sharing (co-pay) of up to 20% of the service fee (after annual deductible is met). Patient agreed to services and consent obtained.  Patient Care Team: Denita Lung, MD as PCP - General (Family Medicine) Troy Sine, MD as PCP - Cardiology (Cardiology) Viona Gilmore, [facility]  as Pharmacist (Pharmacist)  Recent office visits: 03/13/21 Jill Alexanders MD (PCP) - seen for chronic combined systolic and diastolic heart failure and other chronic conditions. Patient started on atorvastatin 42m daily. Keep all scheduled follow up appointments.   Recent consult visits: 12/12/20 TShelva MajesticMD (Cardiology) - seen for hypertension and other issues.  Discontinued methocarbamol 5088mevery 6 hours as needed.    12/08/20 MoCurt BearsD (Oncology) - seen for history of B - cell lymphoma. Discontinued aspirin-caffeine 400 - 3259mFollow up in 1 year.  Hospital visits: None in previous 6 months  Objective:  Lab Results  Component Value Date   CREATININE 1.41 (H) 12/06/2020   BUN 19 12/06/2020   GFRNONAA 38 (L) 12/06/2020   GFRAA 39 (L) 08/17/2020   NA 143 12/06/2020   K 3.8 12/06/2020   CALCIUM 9.3 12/06/2020   CO2 28 12/06/2020   GLUCOSE 111 (H) 12/06/2020    Lab Results  Component Value Date/Time   HGBA1C 5.4 01/14/2019 12:00 AM   HGBA1C 6.3 (H) 03/28/2013 04:00 AM    Last diabetic Eye exam:  Lab Results  Component Value Date/Time   HMDIABEYEEXA Retinopathy (A) 02/16/2020 12:00 AM    Last diabetic Foot exam: No results found for: HMDIABFOOTEX   Lab Results  Component Value Date   CHOL 177 12/05/2020   HDL 56 12/05/2020   LDLCALC 90 12/05/2020   TRIG 183 (H) 12/05/2020   CHOLHDL 3.2 12/05/2020    Hepatic Function Latest Ref Rng & Units 12/06/2020 12/05/2020 08/17/2020  Total Protein 6.5 - 8.1 g/dL 6.2(L) 6.0 6.3  Albumin 3.5 - 5.0 g/dL 3.8 4.2 4.4  AST 15 - 41 U/L '16 17 19  ' ALT 0 - 44 U/L '8 9 10  ' Alk Phosphatase 38 - 126 U/L 54 59 68  Total Bilirubin 0.3 - 1.2 mg/dL 0.5 0.5 0.5    Lab Results  Component Value Date/Time   TSH 2.730 04/05/2020 09:28 AM   TSH 1.925 08/21/2013 07:05 PM    CBC Latest Ref Rng & Units 12/06/2020 06/17/2020 04/05/2020  WBC 4.0 - 10.5 K/uL 4.5 5.1 4.8  Hemoglobin 12.0 - 15.0 g/dL 13.9 14.1 14.7  Hematocrit 36.0 - 46.0 %  44.1 44.4 45.2  Platelets 150 - 400 K/uL 144(L) 124(L) 156    Lab Results  Component Value Date/Time   VD25OH 45 02/18/2017 07:33 AM   VD25OH 50 03/31/2013 10:34 AM    Clinical ASCVD: Yes  The ASCVD Risk score (Arnett DK, et al., 2019) failed to calculate for the following reasons:   The 2019 ASCVD risk score is only valid for ages 80 to 59    Depression screen PHQ 2/9 03/13/2021 03/09/2020 01/14/2019  Decreased Interest 0 0 0  Down, Depressed, Hopeless 0 0 0  PHQ - 2 Score 0 0 0  Some recent data might be hidden     Social History   Tobacco Use  Smoking Status Never  Smokeless Tobacco Never   BP Readings from Last 3 Encounters:  03/13/21 (!) 148/72  12/12/20 (!) 168/84  12/08/20 (!) 169/73   Pulse Readings from Last 3 Encounters:  03/13/21 63  12/12/20 60  12/08/20 (!) 59   Wt Readings from Last 3 Encounters:  03/13/21 132 lb (59.9 kg)  12/12/20 133 lb 9.6 oz (60.6 kg)  12/08/20 133 lb 1.6 oz (60.4 kg)   BMI Readings from Last 3 Encounters:  03/13/21 23.95 kg/m  12/12/20 23.67 kg/m  12/08/20 24.34 kg/m    Assessment/Interventions: Review of patient past medical history, allergies, medications, health status, including review of consultants reports, laboratory and other test data, was performed as part of comprehensive evaluation and provision of chronic care management services.   SDOH:  (Social Determinants of Health) assessments and interventions performed: Yes SDOH Interventions    Flowsheet Row Most Recent Value  SDOH Interventions   Financial Strain Interventions Intervention Not Indicated  Transportation Interventions Intervention Not Indicated      SDOH Screenings   Alcohol Screen: Not on file  Depression (PHQ2-9): Low Risk    PHQ-2 Score: 0  Financial Resource Strain: Low Risk    Difficulty of Paying Living Expenses: Not hard at all  Food Insecurity: Not on file  Housing: Not on file  Physical Activity: Not on file  Social Connections:  Not on file  Stress: Not on file  Tobacco Use: Low Risk    Smoking Tobacco Use: Never   Smokeless Tobacco Use: Never   Passive Exposure: Not on file  Transportation Needs: No Transportation Needs   Lack of Transportation (Medical): No   Lack of Transportation (Non-Medical): No   Patient does a variety of things on a day to day basis. She worked in her yard all day yesterday planting and moving bulbs outside. Patient and husband do and the cooking and cleaning and maintaining of house and yard. Patient reports she used to be big into exercising all the time but with her husband being diagnosed with stage 4 lymphoma, she has slacked more on this.   Patient and husband like to fish. They also enjoy antique  shopping and she gifts some of her finds to her children.  Patient denies any concerns with her medications and feels like everything is addressed with her doctor's appointments and goes to the eye doctor once a year.  Patient reports her biggest issues with her health is probably high blood pressure and this has always been an issue.  Patient does report that she get's restless during the night and has a hard time shutting her mind down. She usually gets enough sleep though and does not feel too tired during the day. She sometimes will take a nap or rest for 15-20 minutes during the day.  CCM Care Plan  Allergies  Allergen Reactions   Prednisone Other (See Comments)    "like being in a coma"   Penicillins Itching, Rash and Other (See Comments)    "burning sensation" Has patient had a PCN reaction causing immediate rash, facial/tongue/throat swelling, SOB or lightheadedness with hypotension: No Has patient had a PCN reaction causing severe rash involving mucus membranes or skin necrosis: No Has patient had a PCN reaction that required hospitalization No Has patient had a PCN reaction occurring within the last 10 years: No If all of the above answers are "NO", then may proceed with  Cephalosporin use.    Medications Reviewed Today     Reviewed by Rubye Beach (Physician Assistant Certified) on 58/30/94 at [place] List Status: <None>   Medication Order Taking? Sig Documenting Provider Last Dose Status Informant  alendronate (FOSAMAX) 70 MG tablet [id number] No TAKE 1 TABLET BY MOUTH EVERY 7 DAYS. TAKE WITH A FULL GLASS OF WATER ON AN EMPTY STOMACH Denita Lung, MD Taking Active   amLODipine (NORVASC) 5 MG tablet [id number] No Take 1 tablet (5 mg total) by mouth daily. Troy Sine, MD Taking Active   aspirin EC 81 MG tablet [id number] No Take 1 tablet (81 mg total) by mouth 2 (two) times daily.  Patient taking differently: Take 81 mg by mouth daily.   Wylene Simmer, MD Taking Active   atorvastatin (LIPITOR) 20 MG tablet [id number]  Take 1 tablet (20 mg total) by mouth daily. Denita Lung, MD  Active   losartan (COZAAR) 100 MG tablet [id number] No TAKE 1 TABLET BY MOUTH EVERY DAY Troy Sine, MD Taking Active   Melatonin 3 MG CAPS [id number] No Take 3 mg by mouth at bedtime as needed (sleep). [provider] Taking Active Self  metoprolol succinate (TOPROL-XL) 100 MG 24 hr tablet [id number]  Take 1 tablet (100 mg total) by mouth daily. Take with or immediately following a meal. Troy Sine, MD  Active   predniSONE (DELTASONE) 10 MG tablet [id number] No Take 40 mg by mouth daily.  Patient not taking: Reported on 03/13/2021   [provider] Not Taking Active            Med Note Ardeen Garland   Thu Dec 08, 2020 11:38 AM) Pt states she takes 5 mg/day  RESTASIS 0.05 % ophthalmic emulsion [id number] No Place 1 drop into both eyes 2 (two) times daily.  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   Age-related osteoporosis without current pathological fracture 02/18/2017   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   Essential hypertension 09/17/2014   Hyperlipidemia 09/17/2014   Thrombocytopenia ([place]) 08/23/2013   Chronic combined systolic and diastolic heart failure ([place]) 04/28/2013    Immunization History  Administered Date(s) Administered   Fluad Quad(high Dose 65+) 04/09/2019, 05/09/2021   Influenza Split 04/12/2011, 04/25/2012   Influenza, High Dose Seasonal PF 04/21/2014, 04/07/2015, 04/24/2016, 04/22/2017, 04/17/2018, 04/17/2020   Influenza,inj,Quad PF,6+ Mos 04/11/2013   PFIZER Comirnaty(Gray Top)Covid-19 Tri-Sucrose Vaccine 03/13/2021   PFIZER(Purple Top)SARS-COV-2 Vaccination 09/13/2019, 10/06/2019, 05/02/2020   Pneumococcal Conjugate-13 12/27/2016   Pneumococcal Polysaccharide-23 04/15/2006, 04/11/2013   Tdap 07/17/2014   Zoster Recombinat (Shingrix) 09/05/2017, 11/23/2017   Zoster, Live 04/15/2006    Conditions to be addressed/monitored:  Hypertension, Hyperlipidemia, Chronic Kidney Disease, and Osteoporosis  Care Plan : [place]  Updates made by Viona Gilmore, [place] since 06/01/2021 12:00 AM     Problem: Problem: Hypertension, Hyperlipidemia, Chronic Kidney Disease, and Osteoporosis      Long-Range Goal: Patient-Specific Goal   Start Date: 06/01/2021  Expected End Date: 06/01/2022  This Visit's Progress: On track  Priority: High  Note:   Current Barriers:  Unable to independently monitor therapeutic efficacy Unable to achieve control of blood pressure   Pharmacist Clinical Goal(s):  Patient will achieve adherence to monitoring guidelines and medication adherence to achieve therapeutic efficacy achieve control of blood pressure as evidenced by home blood pressure readings  through collaboration with PharmD and provider.    Interventions: 1:1 collaboration with Denita Lung, MD regarding development and update of comprehensive plan of care as evidenced by provider attestation and co-signature Inter-disciplinary care team collaboration (see longitudinal plan of care) Comprehensive medication review performed; medication list updated in electronic medical record  Hypertension (BP goal <140/90) -Uncontrolled -Current treatment: Amlodipine 5 mg 1 tablet daily - in AM Losartan 100 mg 1 tablet daily - in AM Metoprolol succinate 100 mg 1 tablet daily - in AM -Medications previously tried: none  -Current home readings: owns a BP cuff but does not check it at home (has calibrated it with office readings in the past) -Current dietary habits:  doesn't use salt at all; fresh vegetables (no canned foods); frozen veggies not frozen meals; eating out socially -Current exercise habits: no structured exercise but active throughout the day -Denies hypotensive/hypertensive symptoms -Educated on BP goals and benefits of medications for prevention of heart attack, stroke and kidney damage; Exercise goal of 150 minutes per week; Importance of home blood pressure monitoring; Proper BP monitoring technique; -Counseled to monitor BP at home weekly, document, and provide log at future appointments -Counseled on diet and exercise extensively Recommended to continue current medication  Aortic atherosclerosis: (LDL goal < 70) -Uncontrolled -Current treatment: Atorvastatin 20 mg 1 tablet daily - not started -Medications previously tried: none  -Current dietary patterns: eats in more often -Current exercise habits: active throughout the day but no structured exercise -Educated on Cholesterol goals;  Benefits of statin for ASCVD risk reduction; -Counseled on diet and exercise extensively Recommended for patient to start taking. Coordinated filling it at the pharmacy.  Osteoporosis  (Goal prevent fractures) -Not ideally  controlled -Last DEXA Scan: 2018   T-Score femoral neck: -2.5  T-Score total hip: n/a  T-Score lumbar spine: n/a (prior surgery)  T-Score forearm radius: -2.7  10-year probability of major osteoporotic fracture: n/a  10-year probability of hip fracture: n/a -Patient is a candidate for pharmacologic treatment due  to T-Score < -2.5 in femoral neck -Current treatment  Alendronate 70 mg 1 tablet every 7 days -Medications previously tried: none  -Recommend [phone] units of vitamin D daily. Recommend 1200 mg of calcium daily from dietary and supplemental sources. Counseled on oral bisphosphonate administration: take in the morning, 30 minutes prior to food with 6-8 oz of water. Do not lie down for at least 30 minutes after taking. Recommend weight-bearing and muscle strengthening exercises for building and maintaining bone density. -Counseled on diet and exercise extensively Recommended to continue current medication Recommended starting supplementation with calcium citrate 600 mg twice daily along with 1000 units of vitamin D  Insomnia (Goal: improve quality and quantity of sleep) -Controlled -Current treatment  Melatonin 3 mg 1 capsule at bedtime as needed -Medications previously tried: none  -Counseled on practicing good sleep hygiene by setting a sleep schedule and maintaining it, avoid excessive napping, following a nightly routine, avoiding screen time for 30-60 minutes before going to bed, and making the bedroom a cool, quiet and dark space  Health Maintenance -Vaccine gaps: none -Current therapy:  Restasis 0.05% ophthalmic solution twice daily Glucosamine 1500 mg daily (osteobiflex) -Educated on Cost vs benefit of each product must be carefully weighed by individual consumer -Patient is satisfied with current therapy and denies issues -Assessed patient finances. Will qualify for Restasis patient assistance.  Patient Goals/Self-Care Activities Patient will:  - take medications as  prescribed as evidenced by patient report and record review check blood pressure weekly, document, and provide at future appointments target a minimum of 150 minutes of moderate intensity exercise weekly  Follow Up Plan: Telephone follow up appointment with care management team member scheduled for: 6 months        Medication Assistance: Application for Restasis  medication assistance program. in process.  Anticipated assistance start date 07/01/21.  See plan of care for additional detail.  Compliance/Adherence/Medication fill history: Care Gaps: COVID booster BP: 148/72 Pulse: 63  Star-Rating Drugs: Atorvastatin 66m - last [facility]/13/[facility] Losartan 1060m- last filled [facility]/21 [facility]  [facility]  [id number] [id number]: [phone]: [phone] pill box? Yes -  weekly Pt endorses 99% compliance   We discussed: Current pharmacy is preferred with insurance plan and patient is satisfied with pharmacy services Patient decided to: Continue current medication management strategy  Care Plan and Follow Up Patient Decision:  Patient agrees to Care Plan and Follow-up.  Plan: Telephone follow up appointment with care management team member scheduled for:  4 months  MadJeni SallesharmD, BCA[place]ab[place] [phone]

## 2021-06-01 NOTE — Patient Instructions (Addendum)
Hi Donnis,  It was great to get to meet you over the telephone! Below is a summary of some of the topics we discussed.   Here are some of the highlights: -Go ahead and pick up the atorvastatin from your pharmacy and start taking it -Try to supplement with calcium citrate 600 mg twice daily with a total of 1000 units (25 mcg) of vitamin D per day -Start checking your blood pressure once weekly at home after you've had your morning medications and I forgot to mention I will have my assistant check in with you to see how that is going in about a month or two  Please reach out to me if you have any questions or need anything before our follow up!  Best, Maddie  Jeni Salles, PharmD, [facility] [phone]   Visit Information   Goals Addressed             This Visit's Progress    Track and Manage My Blood Pressure-Hypertension       Timeframe:  Long-Range Goal Priority:  Medium Start Date:                             Expected End Date:                       Follow Up Date 07/01/21    - check blood pressure weekly - choose a place to take my blood pressure (home, clinic or office, retail store) - write blood pressure results in a log or diary    Why is this important?   You won't feel high blood pressure, but it can still hurt your blood vessels.  High blood pressure can cause heart or kidney problems. It can also cause a stroke.  Making lifestyle changes like losing a little weight or eating less salt will help.  Checking your blood pressure at home and at different times of the day can help to control blood pressure.  If the doctor prescribes medicine remember to take it the way the doctor ordered.  Call the office if you cannot afford the medicine or if there are questions about it.     Notes:        Patient Care Plan: CCM Pharmacy Care Plan     Problem Identified: Problem: Hypertension, Hyperlipidemia, Chronic Kidney  Disease, and Osteoporosis      Long-Range Goal: Patient-Specific Goal   Start Date: 06/01/2021  Expected End Date: 06/01/2022  This Visit's Progress: On track  Priority: High  Note:   Current Barriers:  Unable to independently monitor therapeutic efficacy Unable to achieve control of blood pressure   Pharmacist Clinical Goal(s):  Patient will achieve adherence to monitoring guidelines and medication adherence to achieve therapeutic efficacy achieve control of blood pressure as evidenced by home blood pressure readings  through collaboration with PharmD and provider.   Interventions: 1:1 collaboration with Denita Lung, MD regarding development and update of comprehensive plan of care as evidenced by provider attestation and co-signature Inter-disciplinary care team collaboration (see longitudinal plan of care) Comprehensive medication review performed; medication list updated in electronic medical record  Hypertension (BP goal <140/90) -Uncontrolled -Current treatment: Amlodipine 5 mg 1 tablet daily - in AM Losartan 100 mg 1 tablet daily - in AM Metoprolol succinate 100 mg 1 tablet daily - in AM -Medications previously tried: none  -Current home readings: owns a BP  cuff but does not check it at home (has calibrated it with office readings in the past) -Current dietary habits:  doesn't use salt at all; fresh vegetables (no canned foods); frozen veggies not frozen meals; eating out socially -Current exercise habits: no structured exercise but active throughout the day -Denies hypotensive/hypertensive symptoms -Educated on BP goals and benefits of medications for prevention of heart attack, stroke and kidney damage; Exercise goal of 150 minutes per week; Importance of home blood pressure monitoring; Proper BP monitoring technique; -Counseled to monitor BP at home weekly, document, and provide log at future appointments -Counseled on diet and exercise extensively Recommended to  continue current medication  Aortic atherosclerosis: (LDL goal < 70) -Uncontrolled -Current treatment: Atorvastatin 20 mg 1 tablet daily - not started -Medications previously tried: none  -Current dietary patterns: eats in more often -Current exercise habits: active throughout the day but no structured exercise -Educated on Cholesterol goals;  Benefits of statin for ASCVD risk reduction; -Counseled on diet and exercise extensively Recommended for patient to start taking. Coordinated filling it at the pharmacy.  Osteoporosis  (Goal prevent fractures) -Not ideally controlled -Last DEXA Scan: 2018   T-Score femoral neck: -2.5  T-Score total hip: n/a  T-Score lumbar spine: n/a (prior surgery)  T-Score forearm radius: -2.7  10-year probability of major osteoporotic fracture: n/a  10-year probability of hip fracture: n/a -Patient is a candidate for pharmacologic treatment due to T-Score < -2.5 in femoral neck -Current treatment  Alendronate 70 mg 1 tablet every 7 days -Medications previously tried: none  -Recommend [phone] units of vitamin D daily. Recommend 1200 mg of calcium daily from dietary and supplemental sources. Counseled on oral bisphosphonate administration: take in the morning, 30 minutes prior to food with 6-8 oz of water. Do not lie down for at least 30 minutes after taking. Recommend weight-bearing and muscle strengthening exercises for building and maintaining bone density. -Counseled on diet and exercise extensively Recommended to continue current medication Recommended starting supplementation with calcium citrate 600 mg twice daily along with 1000 units of vitamin D  Insomnia (Goal: improve quality and quantity of sleep) -Controlled -Current treatment  Melatonin 3 mg 1 capsule at bedtime as needed -Medications previously tried: none  -Counseled on practicing good sleep hygiene by setting a sleep schedule and maintaining it, avoid excessive napping, following a  nightly routine, avoiding screen time for 30-60 minutes before going to bed, and making the bedroom a cool, quiet and dark space  Health Maintenance -Vaccine gaps: none -Current therapy:  Restasis 0.05% ophthalmic solution twice daily Glucosamine 1500 mg daily (osteobiflex) -Educated on Cost vs benefit of each product must be carefully weighed by individual consumer -Patient is satisfied with current therapy and denies issues -Assessed patient finances. Will qualify for Restasis patient assistance.  Patient Goals/Self-Care Activities Patient will:  - take medications as prescribed as evidenced by patient report and record review check blood pressure weekly, document, and provide at future appointments target a minimum of 150 minutes of moderate intensity exercise weekly  Follow Up Plan: Telephone follow up appointment with care management team member scheduled for: 6 months      Ms. Tetro was given information about Chronic Care Management services today including:  CCM service includes personalized support from designated clinical staff supervised by her physician, including individualized plan of care and coordination with other care providers 24/7 contact phone numbers for assistance for urgent and routine care needs. Standard insurance, coinsurance, copays and deductibles apply for chronic care management  only during months in which we provide at least 20 minutes of these services. Most insurances cover these services at 100%, however patients may be responsible for any copay, coinsurance and/or deductible if applicable. This service may help you avoid the need for more expensive face-to-face services. Only one practitioner may furnish and bill the service in a calendar month. The patient may stop CCM services at any time (effective at the end of the month) by phone call to the office staff.  Patient agreed to services and verbal consent obtained.   Patient verbalizes  understanding of instructions provided today and agrees to view in [place].  Telephone follow up appointment with pharmacy team member scheduled for: 4 months  Viona Gilmore, [facility]

## 2021-06-01 NOTE — Telephone Encounter (Signed)
Called patient and she is aware the order has been placed and will receive a phone call to schedule.

## 2021-06-10 ENCOUNTER — Other Ambulatory Visit: Payer: Self-pay | Admitting: Family Medicine

## 2021-06-10 DIAGNOSIS — M810 Age-related osteoporosis without current pathological fracture: Secondary | ICD-10-CM

## 2021-06-11 NOTE — Progress Notes (Signed)
Cardiology Office Note:    Date:  06/12/2021   ID:  Tonya Wise, DOB 12-30-1940, MRN [id number]  PCP:  Denita Lung, MD [place] Cardiologist: Shelva Majestic, MD   Reason for visit: 52-month follow-up  History of Present Illness:    Tonya Wise is a 80 y.o. 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  She last saw Dr. Claiborne Billings in 11/2020.  Noted to have increased blood pressure.  He increase amlodipine to 5 mg daily.  Today, she states she is doing excellent.  She denies chest pain, shortness of breath, PND, orthopnea, lower extremity edema, palpitations, lightheadedness, syncope and bleeding issues.  She states her PCP had prescribed statin medication.  She had not started taking this until a week ago.  She is not sure if she needs this.  She mentions history of stroke and aortic plaque.  She states she cooks, cleans and does yard work without symptoms.  Patient lives with her husband.     Past Medical History:  Diagnosis Date   Allergy    Anemia    Ankle fracture, left    Chemotherapy induced cardiomyopathy ([place])    a. 08/2014: echo showing EF of 35-40% with Grade 2 DD and mild MR   Diastolic dysfunction, grade I 04/12/2013   GERD (gastroesophageal reflux disease)    Tums or Maalox   Heart murmur    Hypercalcemia 03/27/2013   Hypertension    Non-Hodgkins lymphoma ([place]) 04/06/2013   Osteopenia    Renal insufficiency    Stroke [facility]) 2015   per pt while she was in hospital for Nonhodgkins Lymphoma    Past Surgical History:  Procedure Laterality Date   ABDOMINAL HYSTERECTOMY     ANTERIOR LAT LUMBAR FUSION N/A 06/28/2020   Procedure: Lumbar two-three Anterolateral lumbar interbody fusion with lateral plate;  Surgeon: Kristeen Miss, MD;  Location: [place];  Service: Neurosurgery;  Laterality: N/A;   APPENDECTOMY      BACK SURGERY     LOWER BACK TWICE   COLONOSCOPY  2008   ESOPHAGOGASTRODUODENOSCOPY N/A 03/29/2013   Procedure: ESOPHAGOGASTRODUODENOSCOPY (EGD);  Surgeon: Juanita Craver, MD;  Location: [facility] ENDOSCOPY;  Service: Endoscopy;  Laterality: N/A;   INGUINAL LYMPH NODE BIOPSY Right 03/31/2013   Procedure: INGUINAL LYMPH NODE BIOPSY;  Surgeon: Gwenyth Ober, MD;  Location: [place];  Service: General;  Laterality: Right;   ORIF ANKLE FRACTURE Left 11/03/2015   Procedure: OPEN REDUCTION INTERNAL FIXATION (ORIF) LEFT ANKLE FRACTURE;  Surgeon: Wylene Simmer, MD;  Location: [place];  Service: Orthopedics;  Laterality: Left;   SPINE SURGERY      Current Medications: Current Meds  Medication Sig   alendronate (FOSAMAX) 70 MG tablet TAKE 1 TABLET BY MOUTH EVERY 7 DAYS. TAKE WITH A FULL GLASS OF WATER ON AN EMPTY STOMACH   amLODipine (NORVASC) 5 MG tablet Take 1 tablet (5 mg total) by mouth daily.   aspirin EC 81 MG tablet Take 1 tablet (81 mg total) by mouth 2 (two) times daily. (Patient taking differently: Take 81 mg by mouth daily.)   atorvastatin (LIPITOR) 20 MG tablet Take 1 tablet (20 mg total) by mouth daily.   Glucosamine-Chondroit-Vit C-Mn (GLUCOSAMINE 1500 COMPLEX PO) Take 1 tablet by mouth daily.   losartan (COZAAR) 100 MG tablet TAKE 1 TABLET BY MOUTH EVERY DAY   Melatonin 3 MG CAPS  Take 3 mg by mouth at bedtime as needed (sleep).   metoprolol succinate (TOPROL-XL) 100 MG 24 hr tablet Take 1 tablet (100 mg total) by mouth daily. Take with or immediately following a meal.   RESTASIS 0.05 % ophthalmic emulsion Place 1 drop into both eyes 2 (two) times daily.      Allergies:   Prednisone and Penicillins   Social History   Socioeconomic History   Marital status: Married    Spouse name: [name]   Number of children: 2   Years of education: 12   Highest education level: Not on file  Occupational History    Employer: [employer]  Tobacco Use   Smoking status: Never   Smokeless  tobacco: Never  Substance and Sexual Activity   Alcohol use: No   Drug use: No   Sexual activity: Yes    Partners: Male  Other Topics Concern   Not on file  Social History Narrative   Married.  Lives in [place] with husband.     Social Determinants of Health   Financial Resource Strain: Low Risk    Difficulty of Paying Living Expenses: Not hard at all  Food Insecurity: Not on file  Transportation Needs: No Transportation Needs   Lack of Transportation (Medical): No   Lack of Transportation (Non-Medical): No  Physical Activity: Not on file  Stress: Not on file  Social Connections: Not on file     Family History: The patient's family history includes Diabetes in her brother and sister; Heart attack (age of onset: 10) in her father; Leukemia in her mother.  ROS:   Please see the history of present illness.     EKGs/Labs/Other Studies Reviewed:    EKG:  The ekg ordered today demonstrates sinus bradycardia, nonspecific T wave abnormality unchanged, 53, PR interval 154 ms, QRS duration 102 ms.  Recent Labs: 12/06/2020: ALT 8; BUN 19; Creatinine 1.41; Hemoglobin 13.9; Platelet Count 144; Potassium 3.8; Sodium 143   Recent Lipid Panel Lab Results  Component Value Date/Time   CHOL 177 12/05/2020 09:49 AM   TRIG 183 (H) 12/05/2020 09:49 AM   HDL 56 12/05/2020 09:49 AM   LDLCALC 90 12/05/2020 09:49 AM    Physical Exam:    VS:  BP 130/80   Pulse (!) 53   Ht 5\' 2"  (1.575 m)   Wt 127 lb 6.4 oz (57.8 kg)   SpO2 98%   BMI 23.30 kg/m    No data found.  Wt Readings from Last 3 Encounters:  06/12/21 127 lb 6.4 oz (57.8 kg)  03/13/21 132 lb (59.9 kg)  12/12/20 133 lb 9.6 oz (60.6 kg)     GEN:  Well nourished, well developed in no acute distress HEENT: Normal NECK: No JVD; No carotid bruits CARDIAC: RRR, no murmurs, rubs, gallops RESPIRATORY:  Clear to auscultation without rales, wheezing or rhonchi  ABDOMEN: Soft, non-tender, non-distended MUSCULOSKELETAL: No edema;  No deformity  SKIN: Warm and dry NEUROLOGIC:  Alert and oriented PSYCHIATRIC:  Normal affect     ASSESSMENT AND PLAN   Aortic and branch vessel atherosclerosis on CT chest Hx of CVA Hyperlipidemia -LDL 99 in 11/2020. -Continue statin & aspirin therapy.  Hx of chemo induced cardiomyopathy -EF 35 to 40% in 2016; EF normalized on echo September 2021 -No heart failure symptoms. -Continue losartan and Toprol.  She does have mild sinus bradycardia with heart rate 53.  Since she feels so well, we will continue current Toprol dose.  Hypertension, well controlled -BP  improved with increased dose of amlodipine. -Continue current medications. -Goal BP is ~130/80.    Disposition - Follow-up in 1 year.     Medication Adjustments/Labs and Tests Ordered: Current medicines are reviewed at length with the patient today.  Concerns regarding medicines are outlined above.  Orders Placed This Encounter  Procedures   EKG 12-Lead   No orders of the defined types were placed in this encounter.   Patient Instructions  Medication Instructions:  No Changes *If you need a refill on your cardiac medications before your next appointment, please call your pharmacy*   Lab Work: No Labs If you have labs (blood work) drawn today and your tests are completely normal, you will receive your results only by: [place] (if you have MyChart) OR A paper copy in the mail If you have any lab test that is abnormal or we need to change your treatment, we will call you to review the results.   Testing/Procedures: No Testing   Follow-Up: At [facility], you and your health needs are our priority.  As part of our continuing mission to provide you with exceptional heart care, we have created designated Provider Care Teams.  These Care Teams include your primary Cardiologist (physician) and Advanced Practice Providers (APPs -  Physician Assistants and Nurse Practitioners) who all work together to  provide you with the care you need, when you need it.  We recommend signing up for the patient portal called "MyChart".  Sign up information is provided on this After Visit Summary.  MyChart is used to connect with patients for Virtual Visits (Telemedicine).  Patients are able to view lab/test results, encounter notes, upcoming appointments, etc.  Non-urgent messages can be sent to your provider as well.   To learn more about what you can do with MyChart, go to [email or web address].    Your next appointment:   1 year(s)  The format for your next appointment:   In Person  Provider:   Shelva Majestic, MD       Signed, Warren Lacy, PA-C  06/12/2021 9:31 AM    [place]

## 2021-06-12 ENCOUNTER — Other Ambulatory Visit: Payer: Self-pay

## 2021-06-12 ENCOUNTER — Ambulatory Visit (INDEPENDENT_AMBULATORY_CARE_PROVIDER_SITE_OTHER): Payer: Medicare Other | Admitting: Physician Assistant

## 2021-06-12 VITALS — BP 130/80 | HR 53 | Ht 62.0 in | Wt 127.4 lb

## 2021-06-12 DIAGNOSIS — I10 Essential (primary) hypertension: Secondary | ICD-10-CM | POA: Diagnosis not present

## 2021-06-12 DIAGNOSIS — I427 Cardiomyopathy due to drug and external agent: Secondary | ICD-10-CM

## 2021-06-12 DIAGNOSIS — T451X5D Adverse effect of antineoplastic and immunosuppressive drugs, subsequent encounter: Secondary | ICD-10-CM

## 2021-06-12 DIAGNOSIS — I700 Atherosclerosis of aorta: Secondary | ICD-10-CM | POA: Diagnosis not present

## 2021-06-12 DIAGNOSIS — E782 Mixed hyperlipidemia: Secondary | ICD-10-CM | POA: Diagnosis not present

## 2021-06-12 NOTE — Patient Instructions (Signed)
Medication Instructions:  No Changes *If you need a refill on your cardiac medications before your next appointment, please call your pharmacy*   Lab Work: No Labs If you have labs (blood work) drawn today and your tests are completely normal, you will receive your results only by: [place] (if you have MyChart) OR A paper copy in the mail If you have any lab test that is abnormal or we need to change your treatment, we will call you to review the results.   Testing/Procedures: No Testing   Follow-Up: At [facility], you and your health needs are our priority.  As part of our continuing mission to provide you with exceptional heart care, we have created designated Provider Care Teams.  These Care Teams include your primary Cardiologist (physician) and Advanced Practice Providers (APPs -  Physician Assistants and Nurse Practitioners) who all work together to provide you with the care you need, when you need it.  We recommend signing up for the patient portal called "MyChart".  Sign up information is provided on this After Visit Summary.  MyChart is used to connect with patients for Virtual Visits (Telemedicine).  Patients are able to view lab/test results, encounter notes, upcoming appointments, etc.  Non-urgent messages can be sent to your provider as well.   To learn more about what you can do with MyChart, go to [email or web address].    Your next appointment:   1 year(s)  The format for your next appointment:   In Person  Provider:   Shelva Majestic, MD

## 2021-06-28 DIAGNOSIS — M810 Age-related osteoporosis without current pathological fracture: Secondary | ICD-10-CM

## 2021-06-28 DIAGNOSIS — E785 Hyperlipidemia, unspecified: Secondary | ICD-10-CM | POA: Diagnosis not present

## 2021-06-28 DIAGNOSIS — I700 Atherosclerosis of aorta: Secondary | ICD-10-CM

## 2021-07-10 DIAGNOSIS — I872 Venous insufficiency (chronic) (peripheral): Secondary | ICD-10-CM | POA: Diagnosis not present

## 2021-07-10 DIAGNOSIS — I8312 Varicose veins of left lower extremity with inflammation: Secondary | ICD-10-CM | POA: Diagnosis not present

## 2021-07-10 DIAGNOSIS — L570 Actinic keratosis: Secondary | ICD-10-CM | POA: Diagnosis not present

## 2021-07-10 DIAGNOSIS — I8311 Varicose veins of right lower extremity with inflammation: Secondary | ICD-10-CM | POA: Diagnosis not present

## 2021-08-17 ENCOUNTER — Telehealth: Payer: Self-pay | Admitting: Pharmacist

## 2021-08-17 NOTE — Progress Notes (Signed)
A user error has taken place: encounter opened in error, closed for administrative reasons.

## 2021-08-17 NOTE — Chronic Care Management (AMB) (Signed)
Chronic Care Management Pharmacy Assistant   Name: Tonya Wise  MRN: [id number] DOB: Aug 26, 1940  Reason for Encounter: Disease State Hypertension Assessment   Conditions to be addressed/monitored: HTN  Recent office visits:    Recent consult visits:  06/12/21 Warren Lacy, PA-C - Patient presented for Essential hypertension and other concerns. Stopped Prednisone 40 mg.   Hospital visits:  None in previous 6 months  Medications: Outpatient Encounter Medications as of 08/17/2021  Medication Sig   alendronate (FOSAMAX) 70 MG tablet TAKE 1 TABLET BY MOUTH EVERY 7 DAYS. TAKE WITH A FULL GLASS OF WATER ON AN EMPTY STOMACH   amLODipine (NORVASC) 5 MG tablet Take 1 tablet (5 mg total) by mouth daily.   aspirin EC 81 MG tablet Take 1 tablet (81 mg total) by mouth 2 (two) times daily. (Patient taking differently: Take 81 mg by mouth daily.)   atorvastatin (LIPITOR) 20 MG tablet Take 1 tablet (20 mg total) by mouth daily.   Glucosamine-Chondroit-Vit C-Mn (GLUCOSAMINE 1500 COMPLEX PO) Take 1 tablet by mouth daily.   losartan (COZAAR) 100 MG tablet TAKE 1 TABLET BY MOUTH EVERY DAY   Melatonin 3 MG CAPS Take 3 mg by mouth at bedtime as needed (sleep).   metoprolol succinate (TOPROL-XL) 100 MG 24 hr tablet Take 1 tablet (100 mg total) by mouth daily. Take with or immediately following a meal.   RESTASIS 0.05 % ophthalmic emulsion Place 1 drop into both eyes 2 (two) times daily.    No facility-administered encounter medications on file as of 08/17/2021.  Reviewed chart prior to disease state call. Spoke with patient regarding BP  Recent Office Vitals: BP Readings from Last 3 Encounters:  06/12/21 130/80  03/13/21 (!) 148/72  12/12/20 (!) 168/84   Pulse Readings from Last 3 Encounters:  06/12/21 (!) 53  03/13/21 63  12/12/20 60    Wt Readings from Last 3 Encounters:  06/12/21 127 lb 6.4 oz (57.8 kg)  03/13/21 132 lb (59.9 kg)  12/12/20 133 lb 9.6 oz (60.6 kg)      Kidney Function Lab Results  Component Value Date/Time   CREATININE 1.41 (H) 12/06/2020 08:07 AM   CREATININE 1.27 (H) 12/05/2020 09:49 AM   CREATININE 1.48 (H) 08/17/2020 08:19 AM   CREATININE 1.38 (H) 12/07/2019 08:52 AM   CREATININE 1.46 (H) 08/05/2017 04:39 PM   CREATININE 1.3 (H) 12/12/2016 08:13 AM   CREATININE 1.3 (H) 06/13/2016 08:01 AM   GFRNONAA 38 (L) 12/06/2020 08:07 AM   GFRAA 39 (L) 08/17/2020 08:19 AM   GFRAA 42 (L) 12/07/2019 08:52 AM    BMP Latest Ref Rng & Units 12/06/2020 12/05/2020 08/17/2020  Glucose 70 - 99 mg/dL 111(H) 100(H) 99  BUN 8 - 23 mg/dL 19 16 18   Creatinine 0.44 - 1.00 mg/dL 1.41(H) 1.27(H) 1.48(H)  BUN/Creat Ratio 12 - 28 - 13 12  Sodium 135 - 145 mmol/L 143 139 144  Potassium 3.5 - 5.1 mmol/L 3.8 4.9 4.2  Chloride 98 - 111 mmol/L 104 101 103  CO2 22 - 32 mmol/L 28 25 24   Calcium 8.9 - 10.3 mg/dL 9.3 9.9 9.5    Current antihypertensive regimen:  Amlodipine 5 mg 1 tablet daily - in AM Losartan 100 mg 1 tablet daily - in AM Metoprolol succinate 100 mg 1 tablet daily - in AM How often are you checking your Blood Pressure? infrequently Current home BP readings:  BP Readings from Last 3 Encounters:  06/12/21 130/80  03/13/21 (!) 148/72  12/12/20 (!) 168/84  Patient reports her home readings are consistently between 145-165 on the top and between 70-78 for the bottom. She reports an occasional headache and states that she has suffered from tension headaches all her life does not think that it is blood pressure related. She reports she follows up Cardiology Dr Claiborne Billings for her hypertension and he is not concerned with the ranges of her blood pressures. What recent interventions/DTPs have been made by any provider to improve Blood Pressure control since last CPP Visit: Patient reports no change Any recent hospitalizations or ED visits since last visit with CPP? No What diet changes have been made to improve Blood Pressure Control?  Patient reports she  watches sodium with her meals does not cook with salt. She reports she eats frozen vegetables and dried vegetables does not use canned ones.   Adherence Review: Is the patient currently on ACE/ARB medication? Yes Does the patient have >5 day gap between last estimated fill dates? No     Care Gaps: COVID Booster - Overdue CCM- 3/23 AWV- 8/22 BP- 130/80 ( 06/12/21)  Star Rating Drugs: Atorvastatin (Lipitor) 20 mg - Last [facility]/21 [facility] Losartan (Cozaar) 100 mg - Last filled  05/25/21 [facility] not >5 days did not verify   [place] Pharmacist Assistant [phone]

## 2021-09-06 ENCOUNTER — Other Ambulatory Visit: Payer: Self-pay | Admitting: Family Medicine

## 2021-09-06 DIAGNOSIS — M810 Age-related osteoporosis without current pathological fracture: Secondary | ICD-10-CM

## 2021-09-11 DIAGNOSIS — L578 Other skin changes due to chronic exposure to nonionizing radiation: Secondary | ICD-10-CM | POA: Diagnosis not present

## 2021-09-11 DIAGNOSIS — L570 Actinic keratosis: Secondary | ICD-10-CM | POA: Diagnosis not present

## 2021-09-20 ENCOUNTER — Other Ambulatory Visit: Payer: Self-pay | Admitting: Cardiovascular Disease

## 2021-09-28 ENCOUNTER — Other Ambulatory Visit: Payer: Self-pay | Admitting: Family Medicine

## 2021-09-28 ENCOUNTER — Telehealth: Payer: Medicare Other

## 2021-09-28 DIAGNOSIS — M810 Age-related osteoporosis without current pathological fracture: Secondary | ICD-10-CM

## 2021-11-09 ENCOUNTER — Other Ambulatory Visit: Payer: Medicare Other

## 2021-11-17 ENCOUNTER — Other Ambulatory Visit: Payer: Self-pay | Admitting: Cardiovascular Disease

## 2021-11-27 ENCOUNTER — Encounter: Payer: Self-pay | Admitting: Family Medicine

## 2021-11-27 ENCOUNTER — Ambulatory Visit (INDEPENDENT_AMBULATORY_CARE_PROVIDER_SITE_OTHER): Payer: Medicare Other | Admitting: Family Medicine

## 2021-11-27 VITALS — BP 150/76 | HR 72 | Temp 97.5°F | Wt 125.4 lb

## 2021-11-27 DIAGNOSIS — S20212A Contusion of left front wall of thorax, initial encounter: Secondary | ICD-10-CM

## 2021-11-27 DIAGNOSIS — M810 Age-related osteoporosis without current pathological fracture: Secondary | ICD-10-CM | POA: Diagnosis not present

## 2021-11-27 NOTE — Progress Notes (Signed)
? ?  Subjective:  ? ? Patient ID: Tonya Wise, female    DOB: 08/09/1940, 81 y.o.   MRN: [id number] ? ?HPI ?She states that she fell last Wednesday going down some steps and injured the left rib and hip area.  Since then she continues have some slight pleuritic chest pain and minimal pain over the iliac crest.  No nausea, vomiting, shortness of breath, dyspnea.  Previous history indicates osteoporosis. ? ? ?Review of Systems ? ?   ?Objective:  ? Physical Exam ?Alert and in no distress.  Slight tenderness palpation to the left lower anterolateral rib area.  Lungs are clear to auscultation.  Splnting of the ribs decreased the pain.  Abdominal exam shows no masses or tenderness.  Slight tenderness over the ASIC ? ? ? ?   ?Assessment & Plan:  ?Contusion of rib on left side, initial encounter ? ?Age-related osteoporosis without current pathological fracture ?I explained that at this point she most likely has a rib contusion and supportive care is the main thing.  Also the same thing for the iliac crest discomfort.  Explained that the first 48 hours after the injury would have been the most significant in terms of rib damage and subsequent damage to underlying tissues. ? ?

## 2021-11-27 NOTE — Patient Instructions (Signed)
You can take 2 Tylenol every 4 hours as needed for pain.  You can use the Ace wrap to help with that as well and if you have to cough splint that area ?

## 2021-12-04 ENCOUNTER — Encounter (HOSPITAL_COMMUNITY): Payer: Self-pay

## 2021-12-04 ENCOUNTER — Ambulatory Visit (HOSPITAL_COMMUNITY)
Admission: RE | Admit: 2021-12-04 | Discharge: 2021-12-04 | Disposition: A | Discharge status: Home / Self Care | Payer: Medicare Other | Source: Ambulatory Visit | Attending: Internal Medicine | Admitting: Internal Medicine

## 2021-12-04 DIAGNOSIS — Z8572 Personal history of non-Hodgkin lymphomas: Secondary | ICD-10-CM | POA: Insufficient documentation

## 2021-12-04 DIAGNOSIS — I2510 Atherosclerotic heart disease of native coronary artery without angina pectoris: Secondary | ICD-10-CM | POA: Diagnosis not present

## 2021-12-04 DIAGNOSIS — K3189 Other diseases of stomach and duodenum: Secondary | ICD-10-CM | POA: Diagnosis not present

## 2021-12-04 DIAGNOSIS — K449 Diaphragmatic hernia without obstruction or gangrene: Secondary | ICD-10-CM | POA: Diagnosis not present

## 2021-12-04 DIAGNOSIS — C8590 Non-Hodgkin lymphoma, unspecified, unspecified site: Secondary | ICD-10-CM | POA: Diagnosis not present

## 2021-12-04 DIAGNOSIS — M4317 Spondylolisthesis, lumbosacral region: Secondary | ICD-10-CM | POA: Diagnosis not present

## 2021-12-04 LAB — POCT I-STAT CREATININE: Creatinine, Ser: 1.3 mg/dL — ABNORMAL HIGH (ref 0.44–1.00)

## 2021-12-04 MED ORDER — IOHEXOL 300 MG/ML  SOLN
75.0000 mL | Freq: Once | INTRAMUSCULAR | Status: AC | PRN
  Administered 2021-12-04: 75 mL via INTRAVENOUS

## 2021-12-04 MED ORDER — SODIUM CHLORIDE (PF) 0.9 % IJ SOLN
INTRAMUSCULAR | Status: AC
  Filled 2021-12-04: qty 50

## 2021-12-09 ENCOUNTER — Other Ambulatory Visit: Payer: Self-pay | Admitting: Cardiovascular Disease

## 2021-12-11 ENCOUNTER — Other Ambulatory Visit: Payer: Self-pay | Admitting: Family Medicine

## 2021-12-11 ENCOUNTER — Other Ambulatory Visit: Payer: Self-pay

## 2021-12-11 DIAGNOSIS — Z8572 Personal history of non-Hodgkin lymphomas: Secondary | ICD-10-CM

## 2021-12-11 DIAGNOSIS — M810 Age-related osteoporosis without current pathological fracture: Secondary | ICD-10-CM

## 2021-12-12 ENCOUNTER — Inpatient Hospital Stay: Payer: Medicare Other

## 2021-12-12 ENCOUNTER — Other Ambulatory Visit: Payer: Self-pay

## 2021-12-12 ENCOUNTER — Inpatient Hospital Stay: Payer: Medicare Other | Attending: Internal Medicine | Admitting: Internal Medicine

## 2021-12-12 VITALS — BP 152/79 | HR 57 | Temp 96.7°F | Resp 17 | Wt 126.0 lb

## 2021-12-12 DIAGNOSIS — C8590 Non-Hodgkin lymphoma, unspecified, unspecified site: Secondary | ICD-10-CM | POA: Insufficient documentation

## 2021-12-12 DIAGNOSIS — Z8572 Personal history of non-Hodgkin lymphomas: Secondary | ICD-10-CM

## 2021-12-12 DIAGNOSIS — Z79899 Other long term (current) drug therapy: Secondary | ICD-10-CM | POA: Insufficient documentation

## 2021-12-12 DIAGNOSIS — M8580 Other specified disorders of bone density and structure, unspecified site: Secondary | ICD-10-CM | POA: Insufficient documentation

## 2021-12-12 LAB — CBC WITH DIFFERENTIAL (CANCER CENTER ONLY)
Abs Immature Granulocytes: 0.01 10*3/uL (ref 0.00–0.07)
Basophils Absolute: 0 10*3/uL (ref 0.0–0.1)
Basophils Relative: 0 %
Eosinophils Absolute: 0.1 10*3/uL (ref 0.0–0.5)
Eosinophils Relative: 1 %
HCT: 37.6 % (ref 36.0–46.0)
Hemoglobin: 12.4 g/dL (ref 12.0–15.0)
Immature Granulocytes: 0 %
Lymphocytes Relative: 27 %
Lymphs Abs: 1.2 10*3/uL (ref 0.7–4.0)
MCH: 27.7 pg (ref 26.0–34.0)
MCHC: 33 g/dL (ref 30.0–36.0)
MCV: 83.9 fL (ref 80.0–100.0)
Monocytes Absolute: 0.4 10*3/uL (ref 0.1–1.0)
Monocytes Relative: 9 %
Neutro Abs: 2.8 10*3/uL (ref 1.7–7.7)
Neutrophils Relative %: 63 %
Platelet Count: 149 10*3/uL — ABNORMAL LOW (ref 150–400)
RBC: 4.48 MIL/uL (ref 3.87–5.11)
RDW: 14.2 % (ref 11.5–15.5)
WBC Count: 4.5 10*3/uL (ref 4.0–10.5)
nRBC: 0 % (ref 0.0–0.2)

## 2021-12-12 LAB — CMP (CANCER CENTER ONLY)
ALT: 7 U/L (ref 0–44)
AST: 15 U/L (ref 15–41)
Albumin: 3.9 g/dL (ref 3.5–5.0)
Alkaline Phosphatase: 81 U/L (ref 38–126)
Anion gap: 4 — ABNORMAL LOW (ref 5–15)
BUN: 12 mg/dL (ref 8–23)
CO2: 31 mmol/L (ref 22–32)
Calcium: 8.9 mg/dL (ref 8.9–10.3)
Chloride: 108 mmol/L (ref 98–111)
Creatinine: 1.25 mg/dL — ABNORMAL HIGH (ref 0.44–1.00)
GFR, Estimated: 44 mL/min — ABNORMAL LOW (ref >60)
Glucose, Bld: 93 mg/dL (ref 70–99)
Potassium: 3.6 mmol/L (ref 3.5–5.1)
Sodium: 143 mmol/L (ref 135–145)
Total Bilirubin: 0.6 mg/dL (ref 0.3–1.2)
Total Protein: 6.1 g/dL — ABNORMAL LOW (ref 6.5–8.1)

## 2021-12-12 LAB — LACTATE DEHYDROGENASE: LDH: 177 U/L (ref 98–192)

## 2021-12-12 NOTE — Progress Notes (Signed)
?    [facility] ?[phone]) [phone]   [phone] ? ?OFFICE PROGRESS NOTE ? ?Denita Lung, MD ?[address] ?[place] [address] ? 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 ? ?PRIOR THERAPY: ?Oncology History  ?Non-Hodgkins lymphoma ([place]) (Resolved)  ?03/27/2013 - 03/31/2013 Hospital Admission  ? Admitted to [facility] for profound anemia (Hgb 6.4).  C/o fevers, drenching nightsweats, weight lost.  Negative colonoscopy and EGD.  CT of C/A/P demonstrated significant lymphadenopathy suspcious of ympha.   ? ?  ?03/31/2013 Initial Diagnosis  ? Biopsy of R inguinal lymph node revealed Non-Hodgkins lymphoma. Diffuse Large B-cell Lymphoma ? ?  ?04/10/2013 - 04/24/2013 Hospital Admission  ? Hypercalcemia, renal failure and dehydration.  Started min-RCHOP.  ? ?  ?04/15/2013 Bone Marrow Biopsy  ? Involvement with Diffuse Large B-cell Lymphoma. Stage IV NHL w B-symptoms.  ? ?  ?04/18/2013 - 04/18/2013 Chemotherapy  ? Mini R-CHOP Cycle #1 on 04/18/2013. Dose reduced to poor nutrion. It consisted on Cytoxan 400 mg/m2,Doxorubicin 25 mg/m2, Rituximab 375 mg/m2, Vincristine 12m.  Prednisone 100 mg daily for 9/13 - 9/16.   Received neupogen on 9/21, 9/27 -9/30.   ? ?  ?04/25/2013 - 04/29/2013 Hospital Admission  ? Admitted due to worsening mouth sores/oral thrush, odynophagia, failure to thrive.  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  [facility]Dr[facility] CGwynneth Munson and discharged to home on 05/19/2013.   ? ?  ?06/01/2013 - 06/01/2013 Chemotherapy  ? Mini R-CHO #2 dosed as above.  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  Received neulasta shot on 06/02/13 ? ?  ?06/16/2013 Cancer Staging  ? Re-staging PET/CT demonstrated significant interval reduction in axillary lymphadenopathy.  There was 1 small residual right axillary lymph node measuring 16 x 12 mm which showed FDG  uptake.  Signifcant reduction in mediastinal and hilar lymphadenopathy.  ? ?  ?06/16/2013 Imaging  ? CT of chest noted left-sided pulmonary embolim. Started on lovenox to coumadin bridge.   ? ?  ?06/22/2013 - 06/22/2013 Chemotherapy  ? Mini-R-CHO #3.  Received Neulasta on 06/23/2013.   ? ?  ?07/16/2013 Imaging  ? 2-D Echocardiogram demonstrates 35-45% EF with Grade 2 diastolic dysfunction.  Doxorubicin discontinued with consultation by cardiology (Dr. KClaiborne Billings.   ? ?  ?08/19/2013 - 08/21/2013 Chemotherapy  ? Mini-R-CEO #4.  Etoposide 25 mg/m2 instead of doxo due to above.  Etoposide 546mbid on day 2/3.  On day 3 (08/21/2013) she received 1 of two oral doses of ectoposide.  ? ?  ?08/21/2013 - 08/26/2013 Hospital Admission  ? [facility] and had influenzae A and pneumonia. Completed tamiflu and oral antibiotics.  ? ?  ?09/25/2013 - 09/27/2013 Chemotherapy  ? Mini-R-CEO #5. Etoposide 25 mg/m2 25 mg bid on days 2/3.  ? ?  ?10/16/2013 - 10/19/2013 Chemotherapy  ? Mini-R-CEO # 6. Etoposide 25 mg/m2 bid on days 2/3. Neulasta on day#4.  ? ?  ?11/05/2013 Imaging  ? Interval resolution of the hypermetabolism identified within the enlarged right axillary lymph node seen previously. This lymph node has also decreased markedly in size in the interval. No new hypermetabolic lesions in the neck, chest, abdomen, or  pelvis.  ? ?  ?11/19/2013 Bone Marrow Biopsy  ? There is no evidence of a B-cell lymphoproliferative process in this material. Normal cytogenetics.  ? ?  ?04/16/2014 Imaging  ? CT scan Chest, abdomen: No evidence of  recurrent lymphoma within the chest or abdomen.   Significant decrease in splenomegaly since prior exam.  ? ?  ? ? ?CURRENT THERAPY: Observation. ? ?INTERVAL HISTORY: ?Tonya Wise 81 y.o. female returns to the clinic today for follow-up visit accompanied by her husband.  The patient is feeling fine today with no concerning complaints.  She denied having any current chest  pain, shortness of breath, cough or hemoptysis.  She denied having any nausea, vomiting, diarrhea or constipation.  She has no headache or visual changes.  She denied having any significant weight loss or night sweats.  She has been on observation now for several years.  The patient had repeat CT scan of the chest, abdomen pelvis performed recently and she is here for evaluation and discussion of her scan results. ? ?MEDICAL HISTORY: ?Past Medical History:  ?Diagnosis Date  ? Allergy   ? Anemia   ? Ankle fracture, left   ? Chemotherapy induced cardiomyopathy ([place])   ? a. 08/2014: echo showing EF of 35-40% with Grade 2 DD and mild MR  ? Diastolic dysfunction, grade I 04/12/2013  ? GERD (gastroesophageal reflux disease)   ? Tums or Maalox  ? Heart murmur   ? Hypercalcemia 03/27/2013  ? Hypertension   ? Non-Hodgkins lymphoma ([place]) 04/06/2013  ? Osteopenia   ? Renal insufficiency   ? Stroke [facility]) 2015  ? per pt while she was in hospital for Nonhodgkins Lymphoma  ? ? ?ALLERGIES:  is allergic to prednisone and penicillins. ? ?MEDICATIONS:  ?Current Outpatient Medications  ?Medication Sig Dispense Refill  ? alendronate (FOSAMAX) 70 MG tablet TAKE 1 TABLET BY MOUTH EVERY 7 DAYS. TAKE WITH A FULL GLASS OF WATER ON AN EMPTY STOMACH 12 tablet 0  ? amLODipine (NORVASC) 5 MG tablet TAKE 1 TABLET (5 MG TOTAL) BY MOUTH DAILY. 90 tablet 3  ? aspirin EC 81 MG tablet Take 1 tablet (81 mg total) by mouth 2 (two) times daily. (Patient taking differently: Take 81 mg by mouth daily.) 90 tablet 0  ? atorvastatin (LIPITOR) 20 MG tablet Take 1 tablet (20 mg total) by mouth daily. 90 tablet 4  ? Glucosamine-Chondroit-Vit C-Mn (GLUCOSAMINE 1500 COMPLEX PO) Take 1 tablet by mouth daily.    ? losartan (COZAAR) 100 MG tablet TAKE 1 TABLET BY MOUTH EVERY DAY 90 tablet 3  ? Melatonin 3 MG CAPS Take 3 mg by mouth at bedtime as needed (sleep).    ? metoprolol succinate (TOPROL-XL) 100 MG 24 hr tablet TAKE 1 TABLET BY MOUTH DAILY. TAKE WITH OR  IMMEDIATELY FOLLOWING A MEAL. 90 tablet 3  ? RESTASIS 0.05 % ophthalmic emulsion Place 1 drop into both eyes 2 (two) times daily.     ? ?No current facility-administered medications for this visit.  ? ? ?SURGICAL HISTORY:  ?Past Surgical History:  ?Procedure Laterality Date  ? ABDOMINAL HYSTERECTOMY    ? ANTERIOR LAT LUMBAR FUSION N/A 06/28/2020  ? Procedure: Lumbar two-three Anterolateral lumbar interbody fusion with lateral plate;  Surgeon: Kristeen Miss, MD;  Location: [facility];  Service: Neurosurgery;  Laterality: N/A;  ? APPENDECTOMY    ? BACK SURGERY    ? LOWER BACK TWICE  ? COLONOSCOPY  2008  ? ESOPHAGOGASTRODUODENOSCOPY N/A 03/29/2013  ? Procedure: ESOPHAGOGASTRODUODENOSCOPY (EGD);  Surgeon: Juanita Craver, MD;  Location: [facility] ENDOSCOPY;  Service: Endoscopy;  Laterality: N/A;  ? INGUINAL LYMPH NODE BIOPSY Right 03/31/2013  ? Procedure: INGUINAL LYMPH NODE BIOPSY;  Surgeon: Gwenyth Ober, MD;  Location: [facility];  Service: General;  Laterality: Right;  ? ORIF ANKLE FRACTURE Left 11/03/2015  ? Procedure: OPEN REDUCTION INTERNAL FIXATION (ORIF) LEFT ANKLE FRACTURE;  Surgeon: Wylene Simmer, MD;  Location: [facility];  Service: Orthopedics;  Laterality: Left;  ? SPINE SURGERY    ? ? ?REVIEW OF SYSTEMS:  A comprehensive review of systems was negative.  ? ?PHYSICAL EXAMINATION: General appearance: alert, cooperative and no distress ?Head: Normocephalic, without obvious abnormality, atraumatic ?Neck: no adenopathy, no JVD, supple, symmetrical, trachea midline and thyroid not enlarged, symmetric, no tenderness/mass/nodules ?Lymph nodes: Cervical, supraclavicular, and axillary nodes normal. ?Resp: clear to auscultation bilaterally ?Back: symmetric, no curvature. ROM normal. No CVA tenderness. ?Cardio: regular rate and rhythm, S1, S2 normal, no murmur, click, rub or gallop ?GI: soft, non-tender; bowel sounds normal; no masses,  no organomegaly ?Extremities: extremities normal, atraumatic, no cyanosis or edema ? ?ECOG  PERFORMANCE STATUS: 1 - Symptomatic but completely ambulatory ? ?Blood pressure (!) 152/79, pulse (!) 57, temperature (!) 96.7 ?F (35.9 ?C), temperature source Tympanic, resp. rate 17, weight 126 lb (57.2 kg), SpO2 100 %.

## 2022-01-01 DIAGNOSIS — H04123 Dry eye syndrome of bilateral lacrimal glands: Secondary | ICD-10-CM | POA: Diagnosis not present

## 2022-01-01 DIAGNOSIS — H524 Presbyopia: Secondary | ICD-10-CM | POA: Diagnosis not present

## 2022-01-01 DIAGNOSIS — Z961 Presence of intraocular lens: Secondary | ICD-10-CM | POA: Diagnosis not present

## 2022-01-01 DIAGNOSIS — H35033 Hypertensive retinopathy, bilateral: Secondary | ICD-10-CM | POA: Diagnosis not present

## 2022-02-19 ENCOUNTER — Other Ambulatory Visit: Payer: Self-pay | Admitting: Family Medicine

## 2022-02-19 DIAGNOSIS — M810 Age-related osteoporosis without current pathological fracture: Secondary | ICD-10-CM

## 2022-02-27 DIAGNOSIS — L281 Prurigo nodularis: Secondary | ICD-10-CM | POA: Diagnosis not present

## 2022-02-27 DIAGNOSIS — L578 Other skin changes due to chronic exposure to nonionizing radiation: Secondary | ICD-10-CM | POA: Diagnosis not present

## 2022-02-27 DIAGNOSIS — C44729 Squamous cell carcinoma of skin of left lower limb, including hip: Secondary | ICD-10-CM | POA: Diagnosis not present

## 2022-02-27 DIAGNOSIS — Z85828 Personal history of other malignant neoplasm of skin: Secondary | ICD-10-CM | POA: Diagnosis not present

## 2022-02-27 DIAGNOSIS — L570 Actinic keratosis: Secondary | ICD-10-CM | POA: Diagnosis not present

## 2022-02-27 HISTORY — PX: SKIN BIOPSY: SHX1

## 2022-03-01 ENCOUNTER — Telehealth (INDEPENDENT_AMBULATORY_CARE_PROVIDER_SITE_OTHER): Payer: Medicare Other | Admitting: Medical

## 2022-03-01 VITALS — Wt 125.0 lb

## 2022-03-01 DIAGNOSIS — F5102 Adjustment insomnia: Secondary | ICD-10-CM

## 2022-03-01 DIAGNOSIS — F4321 Adjustment disorder with depressed mood: Secondary | ICD-10-CM | POA: Diagnosis not present

## 2022-03-01 MED ORDER — LORAZEPAM 0.5 MG PO TABS: 0.5000 mg | ORAL_TABLET | Freq: Two times a day (BID) | ORAL | 0 refills | Status: DC | PRN

## 2022-03-01 NOTE — Progress Notes (Signed)
Subjective:     Patient ID: Tonya Wise, female   DOB: [birth year], 81 y.o.   MRN: [id number]  This visit type was conducted due to national recommendations for restrictions regarding the COVID-19 Pandemic (e.g. social distancing) in an effort to limit this patient's exposure and mitigate transmission in our community.  Due to their co-morbid illnesses, this patient is at least at moderate risk for complications without adequate follow up.  This format is felt to be most appropriate for this patient at this time.    Documentation for virtual audio and video telecommunications through [place] encounter:  The patient was located at home. The provider was located in the office. The patient did consent to this visit and is aware of possible charges through their insurance for this visit.  The other persons participating in this telemedicine service were none. Time spent on call was 20 minutes and in review of previous records 20 minutes total.  This virtual service is not related to other E/M service within previous 7 days.   HPI Chief Complaint  Patient presents with   sleepless night    Sleepless night due to husband dying recently. Becoming manic    Virtual consult for restlessness, sleep problems related to grief.  Lost husband 01/12/22.  Struggling with his loss.  He passed of kidney cancer.   Lives alone.  Does own mowing, housekeeping, manages the household.     Since husband's passing, she is feeling she is going and going, driven, but can't sit still.   Physically doing something all the time.   Will go to bed, sleep a few hours, then wake up.  Sleeping sporadically.  Worries she will crash if she keeps this up.     Kids are with her as much they can, very supportive, but can't seem to get her self back to normal. Sees youngest at least 4 times per week, sees oldest every other day.  Oldest daughter lives less than 1/4 mi from her.    Thinking about joining the [facility].  She  interacts with her children regularly and prepares meals for 2 of her young grandchildren once a week.    The last 4 years her husband was so sick, 80% of time was spent taking care of husband, 3 + docot visits per week.    No prior treatment for mental health issues.  Wants something to help with sleep short term.  Lost brother in law, sister Tonya Wise, and husband Tonya Wise within 6 months.   Past Medical History:  Diagnosis Date   Allergy    Anemia    Ankle fracture, left    Chemotherapy induced cardiomyopathy ([place])    a. 08/2014: echo showing EF of 35-40% with Grade 2 DD and mild MR   Diastolic dysfunction, grade I 04/12/2013   GERD (gastroesophageal reflux disease)    Tums or Maalox   Heart murmur    Hypercalcemia 03/27/2013   Hypertension    Non-Hodgkins lymphoma ([place]) 04/06/2013   Osteopenia    Renal insufficiency    Stroke [facility]) 2015   per pt while she was in hospital for Nonhodgkins Lymphoma   Current Outpatient Medications on File Prior to Visit  Medication Sig Dispense Refill   alendronate (FOSAMAX) 70 MG tablet TAKE 1 TABLET BY MOUTH EVERY 7 DAYS. TAKE WITH A FULL GLASS OF WATER ON AN EMPTY STOMACH 12 tablet 0   amLODipine (NORVASC) 5 MG tablet TAKE 1 TABLET (5 MG TOTAL) BY MOUTH DAILY. 90 tablet  3   aspirin EC 81 MG tablet Take 1 tablet (81 mg total) by mouth 2 (two) times daily. (Patient taking differently: Take 81 mg by mouth daily.) 90 tablet 0   atorvastatin (LIPITOR) 20 MG tablet Take 1 tablet (20 mg total) by mouth daily. 90 tablet 4   Glucosamine-Chondroit-Vit C-Mn (GLUCOSAMINE 1500 COMPLEX PO) Take 1 tablet by mouth daily.     losartan (COZAAR) 100 MG tablet TAKE 1 TABLET BY MOUTH EVERY DAY 90 tablet 3   Melatonin 3 MG CAPS Take 3 mg by mouth at bedtime as needed (sleep).     metoprolol succinate (TOPROL-XL) 100 MG 24 hr tablet TAKE 1 TABLET BY MOUTH DAILY. TAKE WITH OR IMMEDIATELY FOLLOWING A MEAL. 90 tablet 3   RESTASIS 0.05 % ophthalmic emulsion Place 1 drop into both  eyes 2 (two) times daily.      No current facility-administered medications on file prior to visit.   Review of Systems As in subjective    Objective:   Physical Exam Due to coronavirus pandemic stay at home measures, patient visit was virtual and they were not examined in person.   Wt 125 lb (56.7 kg)   BMI 22.86 kg/m   Gen: wd, wn, nad Psych: pleasant, answers questions appropriately, no pressured speech, seems coherent and reasonable      Assessment:     Encounter Diagnoses  Name Primary?   Adjustment insomnia Yes   Grief        Plan:     Discussed symptoms, concerns.  Discussed strategies to deal with the loss and her symptoms.     I expressed sympathy for her loss  Advised counseling either private or through hospice.  Husband did enter hospice right before he passed  Discussed journaling, interactions with friends or others in the community, hobbies  Begin trial of medication to help with anxiety and sleep.  Discussed trazodone, ativan, Seroquel, benadryl.  She will begin trial of Ativan short term.  Discussed proper use, risks/benefits.    I asked her to f/u here within 1-2 weeks.   [place] was seen today for sleepless night.  Diagnoses and all orders for this visit:  Adjustment insomnia  Grief  Other orders -     LORazepam (ATIVAN) 0.5 MG tablet; Take 1 tablet (0.5 mg total) by mouth 2 (two) times daily as needed for anxiety.  F/u with PCP here in a few weeks

## 2022-03-07 ENCOUNTER — Telehealth: Payer: Self-pay | Admitting: Internal Medicine

## 2022-03-07 NOTE — Telephone Encounter (Signed)
Called patient regarding upcoming November appointment, patient is notified.  

## 2022-03-13 ENCOUNTER — Other Ambulatory Visit: Payer: Self-pay | Admitting: Family Medicine

## 2022-03-13 DIAGNOSIS — I700 Atherosclerosis of aorta: Secondary | ICD-10-CM

## 2022-03-13 DIAGNOSIS — M810 Age-related osteoporosis without current pathological fracture: Secondary | ICD-10-CM

## 2022-03-13 DIAGNOSIS — E785 Hyperlipidemia, unspecified: Secondary | ICD-10-CM

## 2022-03-14 ENCOUNTER — Encounter: Payer: Self-pay | Admitting: Family Medicine

## 2022-03-14 ENCOUNTER — Ambulatory Visit (INDEPENDENT_AMBULATORY_CARE_PROVIDER_SITE_OTHER): Payer: Medicare Other | Admitting: Family Medicine

## 2022-03-14 VITALS — BP 120/72 | HR 64 | Temp 98.2°F | Ht 62.75 in | Wt 118.6 lb

## 2022-03-14 DIAGNOSIS — E785 Hyperlipidemia, unspecified: Secondary | ICD-10-CM

## 2022-03-14 DIAGNOSIS — N1832 Chronic kidney disease, stage 3b: Secondary | ICD-10-CM | POA: Diagnosis not present

## 2022-03-14 DIAGNOSIS — I5042 Chronic combined systolic (congestive) and diastolic (congestive) heart failure: Secondary | ICD-10-CM

## 2022-03-14 DIAGNOSIS — M48062 Spinal stenosis, lumbar region with neurogenic claudication: Secondary | ICD-10-CM

## 2022-03-14 DIAGNOSIS — Z8572 Personal history of non-Hodgkin lymphomas: Secondary | ICD-10-CM

## 2022-03-14 DIAGNOSIS — I700 Atherosclerosis of aorta: Secondary | ICD-10-CM

## 2022-03-14 DIAGNOSIS — K219 Gastro-esophageal reflux disease without esophagitis: Secondary | ICD-10-CM | POA: Diagnosis not present

## 2022-03-14 DIAGNOSIS — T451X5A Adverse effect of antineoplastic and immunosuppressive drugs, initial encounter: Secondary | ICD-10-CM

## 2022-03-14 DIAGNOSIS — M810 Age-related osteoporosis without current pathological fracture: Secondary | ICD-10-CM

## 2022-03-14 DIAGNOSIS — I427 Cardiomyopathy due to drug and external agent: Secondary | ICD-10-CM | POA: Diagnosis not present

## 2022-03-14 DIAGNOSIS — I10 Essential (primary) hypertension: Secondary | ICD-10-CM | POA: Diagnosis not present

## 2022-03-14 DIAGNOSIS — D696 Thrombocytopenia, unspecified: Secondary | ICD-10-CM | POA: Diagnosis not present

## 2022-03-14 DIAGNOSIS — F4321 Adjustment disorder with depressed mood: Secondary | ICD-10-CM

## 2022-03-14 MED ORDER — ALPRAZOLAM 0.25 MG PO TABS: 0.2500 mg | ORAL_TABLET | Freq: Two times a day (BID) | ORAL | 0 refills | Status: DC | PRN

## 2022-03-14 NOTE — Progress Notes (Signed)
Tonya Wise is a 81 y.o. female who presents for annual wellness visit and follow-up on chronic medical conditions.  She is in the process of getting the affairs in order considering the death of her husband.  She seems to be slightly overwhelmed with all this and seems to be working her way through this.  She would like to some anxiety medicine to help with this.  She has an appointment followed up with her oncologist for history of B-cell lymphoma.  She does have reflux history and is having some slight abdominal pain.  She continues on Fosamax and is having no difficulty with that.  She is also taking amlodipine/losartan and metoprolol for her blood pressure and underlying cardiac issues.  She follows up regularly with cardiology.  She does have a history of atherosclerosis of the aorta as well as blood work indicating stage III CKD.  Her spinal stenosis is causing very little difficulty and she usually takes pretty good care of her back.  Immunizations and Health Maintenance Immunization History  Administered Date(s) Administered   Fluad Quad(high Dose 65+) 04/09/2019, 05/09/2021   Influenza Split 04/12/2011, 04/25/2012   Influenza, High Dose Seasonal PF 04/21/2014, 04/07/2015, 04/24/2016, 04/22/2017, 04/17/2018, 04/17/2020   Influenza,inj,Quad PF,6+ Mos 04/11/2013   PFIZER Comirnaty(Gray Top)Covid-19 Tri-Sucrose Vaccine 03/13/2021   PFIZER(Purple Top)SARS-COV-2 Vaccination 09/13/2019, 10/06/2019, 05/02/2020   Pneumococcal Conjugate-13 12/27/2016   Pneumococcal Polysaccharide-23 04/15/2006, 04/11/2013   Tdap 07/17/2014   Zoster Recombinat (Shingrix) 09/05/2017, 11/23/2017   Zoster, Live 04/15/2006   Health Maintenance Due  Topic Date Due   COVID-19 Vaccine (5 - Pfizer risk series) 05/08/2021   INFLUENZA VACCINE  02/27/2022    Last Pap smear: 20 years ago Last mammogram: 20 years  Last colonoscopy: due now last one 10 years ago Last DEXA: has not had one Dentist: needs apt Ophtho:  just saw him  Exercise: works outside all the time and does house work   Other doctors caring for patient include: Dr. Benson Norway.  Dr. Julien Nordmann.  Dr. Claiborne Billings.  Dr. Ellene Route  Advanced directives:info given    Depression screen:  See questionnaire below.     03/13/2021    8:25 AM 03/09/2020   10:42 AM 01/14/2019    8:52 AM 01/17/2018    1:12 PM 12/27/2016   10:13 AM  Depression screen PHQ 2/9  Decreased Interest 0 0 0 0 0  Down, Depressed, Hopeless 0 0 0 0 0  PHQ - 2 Score 0 0 0 0 0    Fall Risk Screen: see questionnaire below.    03/13/2021    8:25 AM 03/09/2020   10:41 AM 01/14/2019    8:52 AM 01/17/2018    1:12 PM 12/27/2016   10:13 AM  Fall Risk   Falls in the past year? 0 0 0 No No  Number falls in past yr: 0      Injury with Fall? 0      Risk for fall due to : No Fall Risks No Fall Risks     Follow up Falls evaluation completed        ADL screen:  See questionnaire below   Review of Systems Constitutional: -, -unexpected weight change, -anorexia, -fatigue Allergy: -sneezing, -itching, -congestion Dermatology: denies changing moles, rash, lumps ENT: -runny nose, -ear pain, -sore throat,  Cardiology:  -chest pain, -palpitations, -orthopnea, Respiratory: -cough, -shortness of breath, -dyspnea on exertion, -wheezing,  Gastroenterology: -abdominal pain, -nausea, -vomiting, -diarrhea, -constipation, -dysphagia Hematology: -bleeding or bruising problems Musculoskeletal: -arthralgias, -myalgias, -joint  swelling, -back pain, - Ophthalmology: -vision changes,  Urology: -dysuria, -difficulty urinating,  -urinary frequency, -urgency, incontinence Neurology: -, -numbness, , -memory loss, -falls, -dizziness    PHYSICAL EXAM:   General Appearance: Alert, cooperative, no distress, appears stated age Head: Normocephalic, without obvious abnormality, atraumatic Eyes: PERRL, conjunctiva/corneas clear, EOM's intact, Ears: Normal TM's and external ear canals Nose: Nares normal, mucosa  normal, no drainage or sinus tenderness Throat: Lips, mucosa, and tongue normal; teeth and gums normal Neck: Supple, no lymphadenopathy;  thyroid:  no enlargement/tenderness/nodules; no carotid bruit or JVD Lungs: Clear to auscultation bilaterally without wheezes, rales or ronchi; respirations unlabored Heart: Regular rate and rhythm, S1 and S2 normal, no murmur, rubor gallop Abdomen: Soft, non-tender, nondistended, normoactive bowel sounds,  no masses, no hepatosplenomegaly Extremities: No clubbing, cyanosis or edema Pulses: 2+ and symmetric all extremities Skin:  Skin color, texture, turgor normal, no rashes or lesions Lymph nodes: Cervical, supraclavicular, and axillary nodes normal Neurologic:  CNII-XII intact, normal strength, sensation and gait; reflexes 2+ and symmetric throughout Psych: Normal mood, affect, hygiene and grooming.  ASSESSMENT/PLAN: Aortic atherosclerosis (HCC)  Chemotherapy induced cardiomyopathy (HCC)  Chronic combined systolic and diastolic heart failure (HCC)  Essential hypertension  Gastroesophageal reflux disease without esophagitis  Age-related osteoporosis without current pathological fracture  Stage 3b chronic kidney disease (HCC)  Thrombocytopenia (HCC)  Lumbar stenosis with neurogenic claudication  Hyperlipidemia, unspecified hyperlipidemia type  Grief - Plan: ALPRAZolam (XANAX) 0.25 MG tablet  History of B-cell lymphoma Recommend she try an H2 blocker for her abdominal distress and if no benefit then switch to a PPI.  If he still has difficulty she will call.  I then discussed the stress that she is under dealing with the death of her husband.  Discussed the concept of triaging concerning all the tasks that she needs to accomplish and also to reach out to her children to have them help her with all of this.  Discussed hospice care.  I will also give her Xanax to be used on an as-needed basis.  She will continue on her present medication  regimen.     Immunization recommendations discussed.    Medicare Attestation I have personally reviewed: The patient's medical and social history Their use of alcohol, tobacco or illicit drugs Their current medications and supplements The patient's functional ability including ADLs,fall risks, home safety risks, cognitive, and hearing and visual impairment Diet and physical activities Evidence for depression or mood disorders  The patient's weight, height, and BMI have been recorded in the chart.  I have made referrals, counseling, and provided education to the patient based on review of the above and I have provided the patient with a written personalized care plan for preventive services.     Jill Alexanders, MD   03/14/2022

## 2022-03-14 NOTE — Patient Instructions (Signed)
  [name] , Thank you for taking time to come for your Medicare Wellness Visit. I appreciate your ongoing commitment to your health goals. Please review the following plan we discussed and let me know if I can assist you in the future.  Do the triage on all the things that are before you These are the goals we discussed:  Goals      Track and Manage My Blood Pressure-Hypertension     Timeframe:  Long-Range Goal Priority:  Medium Start Date:                             Expected End Date:                       Follow Up Date 07/01/21    - check blood pressure weekly - choose a place to take my blood pressure (home, clinic or office, retail store) - write blood pressure results in a log or diary    Why is this important?   You won't feel high blood pressure, but it can still hurt your blood vessels.  High blood pressure can cause heart or kidney problems. It can also cause a stroke.  Making lifestyle changes like losing a little weight or eating less salt will help.  Checking your blood pressure at home and at different times of the day can help to control blood pressure.  If the doctor prescribes medicine remember to take it the way the doctor ordered.  Call the office if you cannot afford the medicine or if there are questions about it.     Notes:         This is a list of the screening recommended for you and due dates:  Health Maintenance  Topic Date Due   COVID-19 Vaccine (5 - Pfizer risk series) 05/08/2021   Flu Shot  02/27/2022   Tetanus Vaccine  07/17/2024   Pneumonia Vaccine  Completed   DEXA scan (bone density measurement)  Completed   Zoster (Shingles) Vaccine  Completed   HPV Vaccine  Aged Out   Complete foot exam   Discontinued   Hemoglobin A1C  Discontinued   Eye exam for diabetics  Discontinued

## 2022-03-28 DIAGNOSIS — C44729 Squamous cell carcinoma of skin of left lower limb, including hip: Secondary | ICD-10-CM | POA: Insufficient documentation

## 2022-03-28 HISTORY — DX: Squamous cell carcinoma of skin of left lower limb, including hip: C44.729

## 2022-04-04 ENCOUNTER — Encounter: Payer: Self-pay | Admitting: Internal Medicine

## 2022-04-05 ENCOUNTER — Encounter: Payer: Self-pay | Admitting: Family Medicine

## 2022-05-01 ENCOUNTER — Telehealth: Payer: Self-pay | Admitting: Pharmacist

## 2022-05-01 NOTE — Chronic Care Management (AMB) (Signed)
Chronic Care Management Pharmacy Assistant   Name: LARUE DRAWDY  MRN: [id number] DOB: 09/04/1940  Reason for Encounter: Disease State   Conditions to be addressed/monitored: HTN  Recent office visits:  03/14/22 Denita Lung, MD - Patient presented for Aortic atherosclerosis and other concerns. Prescribed Alprazolam. Stopped Lorazepam.   03/01/22 Tysinger, Camelia Eng, PA-C - Patient presented for Adjustment insomnia and other concerns. Prescribed Lorazepam.  11/27/21  Denita Lung, MD - Patient presented for Contusion of rib on left side and other concerns.  Recent consult visits:  12/12/21 Curt Bears, MD (Oncology) - Patient presented for History of B-Cell lymphoma. No medication changes.  Hospital visits:  None in previous 6 months  Medications: Outpatient Encounter Medications as of 05/01/2022  Medication Sig   alendronate (FOSAMAX) 70 MG tablet TAKE 1 TABLET BY MOUTH EVERY 7 DAYS. TAKE WITH A FULL GLASS OF WATER ON AN EMPTY STOMACH   ALPRAZolam (XANAX) 0.25 MG tablet Take 1 tablet (0.25 mg total) by mouth 2 (two) times daily as needed for anxiety.   amLODipine (NORVASC) 5 MG tablet TAKE 1 TABLET (5 MG TOTAL) BY MOUTH DAILY.   aspirin EC 81 MG tablet Take 1 tablet (81 mg total) by mouth 2 (two) times daily. (Patient taking differently: Take 81 mg by mouth daily.)   atorvastatin (LIPITOR) 20 MG tablet TAKE 1 TABLET BY MOUTH EVERY DAY   Glucosamine-Chondroit-Vit C-Mn (GLUCOSAMINE 1500 COMPLEX PO) Take 1 tablet by mouth daily.   losartan (COZAAR) 100 MG tablet TAKE 1 TABLET BY MOUTH EVERY DAY   Melatonin 3 MG CAPS Take 3 mg by mouth at bedtime as needed (sleep).   metoprolol succinate (TOPROL-XL) 100 MG 24 hr tablet TAKE 1 TABLET BY MOUTH DAILY. TAKE WITH OR IMMEDIATELY FOLLOWING A MEAL.   RESTASIS 0.05 % ophthalmic emulsion Place 1 drop into both eyes 2 (two) times daily.    No facility-administered encounter medications on file as of 05/01/2022.  Reviewed chart prior to  disease state call. Spoke with patient regarding BP  Recent Office Vitals: BP Readings from Last 3 Encounters:  03/14/22 120/72  12/12/21 (!) 152/79  11/27/21 (!) 150/76   Pulse Readings from Last 3 Encounters:  03/14/22 64  12/12/21 (!) 57  11/27/21 72    Wt Readings from Last 3 Encounters:  03/14/22 118 lb 9.6 oz (53.8 kg)  03/01/22 125 lb (56.7 kg)  12/12/21 126 lb (57.2 kg)     Kidney Function Lab Results  Component Value Date/Time   CREATININE 1.25 (H) 12/12/2021 01:33 PM   CREATININE 1.30 (H) 12/04/2021 08:12 AM   CREATININE 1.41 (H) 12/06/2020 08:07 AM   CREATININE 1.46 (H) 08/05/2017 04:39 PM   CREATININE 1.3 (H) 12/12/2016 08:13 AM   CREATININE 1.3 (H) 06/13/2016 08:01 AM   GFRNONAA 44 (L) 12/12/2021 01:33 PM   GFRAA 39 (L) 08/17/2020 08:19 AM   GFRAA 42 (L) 12/07/2019 08:52 AM       Latest Ref Rng & Units 12/12/2021    1:33 PM 12/04/2021    8:12 AM 12/06/2020    8:07 AM  BMP  Glucose 70 - 99 mg/dL 93   111   BUN 8 - 23 mg/dL 12   19   Creatinine 0.44 - 1.00 mg/dL 1.25  1.30  1.41   Sodium 135 - 145 mmol/L 143   143   Potassium 3.5 - 5.1 mmol/L 3.6   3.8   Chloride 98 - 111 mmol/L 108   104  CO2 22 - 32 mmol/L 31   28   Calcium 8.9 - 10.3 mg/dL 8.9   9.3     Current antihypertensive regimen:  Amlodipine 5 mg 1 tablet daily - in AM Losartan 100 mg 1 tablet daily - in AM Metoprolol succinate 100 mg 1 tablet daily - in AM How often are you checking your Blood Pressure? infrequently Current home BP readings:  BP Readings from Last 3 Encounters:  03/14/22 120/72  12/12/21 (!) 152/79  11/27/21 (!) 150/76   What recent interventions/DTPs have been made by any provider to improve Blood Pressure control since last CPP Visit: Patient reports she had be prescribed some Xanax for her anxiety and she still has over half of the fill left she is trying really hard not to take anything. Advised her when she is ready or if she desires to refill it to just call the  office as there are no fills on it. She reports she does not check her pressures at home. She denies any hyper/hypotensive symptoms.  Any recent hospitalizations or ED visits since last visit with CPP? No What exercise is being done to improve your Blood Pressure Control?  Patient reports she does all of her housework and she also mows her lawn and does outside work herself.  Adherence Review: Is the patient currently on ACE/ARB medication? Yes Does the patient have >5 day gap between last estimated fill dates? No     Care Gaps: Diabetic Kidney evaluation (Urine) - Overdue Covid Vaccine - Overdue Flu Vaccine - Overdue Bp- 120/72 03/14/22 AWV-  8/23 CCM- declined Lab Results  Component Value Date   HGBA1C 5.4 01/14/2019     Star Rating Drugs: Atorvastatin 20 mg - Last filled 02/21/22 90 DS at [facility] Losartan 100 mg - Last filled 02/19/22 90 DS at [place] Pharmacist Assistant [phone]

## 2022-05-08 ENCOUNTER — Encounter: Payer: Self-pay | Admitting: Internal Medicine

## 2022-05-09 ENCOUNTER — Other Ambulatory Visit (INDEPENDENT_AMBULATORY_CARE_PROVIDER_SITE_OTHER): Payer: Medicare Other

## 2022-05-09 DIAGNOSIS — Z23 Encounter for immunization: Secondary | ICD-10-CM

## 2022-05-17 ENCOUNTER — Telehealth: Payer: Self-pay | Admitting: Physician Assistant

## 2022-05-17 NOTE — Telephone Encounter (Signed)
Rescheduled 11/16 provider appointment due to provider pal, patient has been called and notified of new rescheduled appointment. 

## 2022-05-21 ENCOUNTER — Encounter: Payer: Self-pay | Admitting: Internal Medicine

## 2022-06-10 NOTE — Progress Notes (Unsigned)
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   Oncology History  Non-Hodgkins lymphoma [facility]) (Resolved)  03/27/2013 - 03/31/2013 Hospital Admission   Admitted to [facility] for profound anemia (Hgb 6.4).  C/o fevers, drenching nightsweats, weight lost.  Negative colonoscopy and EGD.  CT of C/A/P demonstrated significant lymphadenopathy suspcious of ympha.     03/31/2013 Initial Diagnosis   Biopsy of R inguinal lymph node revealed Non-Hodgkins lymphoma. Diffuse Large B-cell Lymphoma   04/10/2013 - 04/24/2013 Hospital Admission   Hypercalcemia, renal failure and dehydration.  Started min-RCHOP.    04/15/2013 Bone Marrow Biopsy   Involvement with Diffuse Large B-cell Lymphoma. Stage IV NHL w B-symptoms.    04/18/2013 - 04/18/2013 Chemotherapy   Mini R-CHOP Cycle #1 on 04/18/2013. Dose reduced to poor nutrion. It consisted on Cytoxan 400 mg/m2,Doxorubicin 25 mg/m2, Rituximab 375 mg/m2, Vincristine 38m.  Prednisone 100 mg daily for 9/13 - 9/16.   Received neupogen on 9/21, 9/27 -9/30.     04/25/2013 - 04/29/2013 Hospital Admission   Admitted due to worsening mouth sores/oral thrush, odynophagia, failure to thrive.  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  [facility] and discharged to home on 05/19/2013.     06/01/2013 - 06/01/2013 Chemotherapy   Mini R-CHO #2 dosed as above.  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  Received neulasta shot on 06/02/13   06/16/2013 Cancer Staging   Re-staging PET/CT demonstrated significant interval reduction in axillary lymphadenopathy.  There was 1 small residual right axillary lymph node measuring 16 x 12 mm which showed FDG uptake.  Signifcant reduction in mediastinal and hilar lymphadenopathy.    06/16/2013  Imaging   CT of chest noted left-sided pulmonary embolim. Started on lovenox to coumadin bridge.     06/22/2013 - 06/22/2013 Chemotherapy   Mini-R-CHO #3.  Received Neulasta on 06/23/2013.     07/16/2013 Imaging   2-D Echocardiogram demonstrates 35-45% EF with Grade 2 diastolic dysfunction.  Doxorubicin discontinued with consultation by cardiology (Dr. KClaiborne Billings.     08/19/2013 - 08/21/2013 Chemotherapy   Mini-R-CEO #4.  Etoposide 25 mg/m2 instead of doxo due to above.  Etoposide 531mbid on day 2/3.  On day 3 (08/21/2013) she received 1 of two oral doses of ectoposide.    08/21/2013 - 08/26/2013 Hospital Admission   [facility] and had influenzae A and pneumonia. Completed tamiflu and oral antibiotics.    09/25/2013 - 09/27/2013 Chemotherapy   Mini-R-CEO #5. Etoposide 25 mg/m2 25 mg bid on days 2/3.    10/16/2013 - 10/19/2013 Chemotherapy   Mini-R-CEO # 6. Etoposide 25 mg/m2 bid on days 2/3. Neulasta on day#4.    11/05/2013 Imaging   Interval resolution of the hypermetabolism identified within the enlarged right axillary lymph node seen previously. This lymph node has also decreased markedly in size in the interval. No new hypermetabolic lesions in the neck, chest, abdomen, or  pelvis.    11/19/2013 Bone Marrow Biopsy   There is no evidence of a B-cell lymphoproliferative process in this material. Normal cytogenetics.    04/16/2014 Imaging   CT scan Chest, abdomen: No evidence of recurrent lymphoma within the chest or abdomen.   Significant decrease in splenomegaly since prior exam.      CURRENT THERAPY: Observation  INTERVAL HISTORY: Tonya Gaddie  Wise 81 y.o. female returns to the clinic today for a 56-monthfollow-up visit.  Since last being seen, she denies changes in her health and is feeling well. Her husband passed away in the interval and she has been very busy the last few months. At her last appointment 6 months ago, we are monitoring her spleen  closely on imaging.  Denies any fever, chills, night sweats, or lymphadenopathy. She reports she is always cold. She did lose weight since last being seen. She is not sure if she is getting full easy or if she is losing interest in food after the death of her husband. She stays very active at home with yard work and cleaning. Denies any early satiety, abdominal pain, or bloating.  Denies any recent or frequent infections.  Denies any abnormal bleeding. She reports easy bruising due to her aspirin and thin skin. She recently had a restaging CT scan.  She is here today for evaluation and to review her scan results.    MEDICAL HISTORY: Past Medical History:  Diagnosis Date   Allergy    Anemia    Ankle fracture, left    Chemotherapy induced [place] ([place]    a. 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  MEDICATIONS:  Current Outpatient Medications  Medication Sig Dispense Refill   alendronate (FOSAMAX) 70 MG tablet TAKE 1 TABLET BY MOUTH EVERY 7 DAYS. TAKE WITH A FULL GLASS OF WATER ON AN EMPTY STOMACH 12 tablet 0   ALPRAZolam (XANAX) 0.25 MG tablet Take 1 tablet (0.25 mg total) by mouth 2 (two) times daily as needed for anxiety. 30 tablet 0   amLODipine (NORVASC) 5 MG tablet TAKE 1 TABLET (5 MG TOTAL) BY MOUTH DAILY. 90 tablet 3   aspirin EC 81 MG tablet Take 1 tablet (81 mg total) by mouth 2 (two) times daily. (Patient taking differently: Take 81 mg by mouth daily.) 90 tablet 0   atorvastatin (LIPITOR) 20 MG tablet TAKE 1 TABLET BY MOUTH  EVERY DAY 90 tablet 3   losartan (COZAAR) 100 MG tablet TAKE 1 TABLET BY MOUTH EVERY DAY 90 tablet 3   Melatonin 3 MG CAPS Take 3 mg by mouth at bedtime as needed (sleep).     metoprolol succinate (TOPROL-XL) 100 MG 24 hr tablet TAKE 1 TABLET BY MOUTH DAILY. TAKE WITH OR IMMEDIATELY FOLLOWING A MEAL. 90 tablet 3   RESTASIS 0.05 % ophthalmic emulsion Place 1 drop into both eyes 2 (two) times daily.      No current facility-administered medications for this visit.    SURGICAL HISTORY:  Past Surgical History:  Procedure Laterality Date   ABDOMINAL HYSTERECTOMY     ANTERIOR LAT LUMBAR FUSION N/A 06/28/2020   Procedure: Lumbar two-three Anterolateral lumbar interbody fusion with lateral plate;  Surgeon: EKristeen Miss MD;  [place]  Service: Neurosurgery;  Laterality: N/A;   APPENDECTOMY     BACK SURGERY     LOWER BACK TWICE   COLONOSCOPY  07/30/2006   ESOPHAGOGASTRODUODENOSCOPY N/A 03/29/2013   Procedure: ESOPHAGOGASTRODUODENOSCOPY (EGD);  Surgeon: Juanita Craver, MD;  Location: [facility] ENDOSCOPY;  Service: Endoscopy;  Laterality: N/A;   INGUINAL LYMPH NODE BIOPSY Right 03/31/2013   Procedure: INGUINAL LYMPH NODE BIOPSY;  Surgeon: Gwenyth Ober, MD;  Location: [place];  Service: General;  Laterality: Right;   ORIF ANKLE FRACTURE Left 11/03/2015   Procedure: OPEN REDUCTION INTERNAL FIXATION (ORIF) LEFT ANKLE FRACTURE;  Surgeon: Wylene Simmer, MD;  Location: [place];  Service: Orthopedics;  Laterality: Left;   SKIN BIOPSY Left 02/27/2022   well differenited squamous cell carcinoma   SPINE SURGERY      REVIEW OF SYSTEMS:   Review of Systems  Constitutional: Positive for weight loss. Negative for appetite change, chills, fatigue, and fever. HENT: Negative for mouth sores, nosebleeds, sore throat and trouble swallowing.   Eyes: Negative for eye problems and icterus.  Respiratory: Negative for cough, hemoptysis, shortness of breath and wheezing.   Cardiovascular: Negative for  chest pain and leg swelling.  Gastrointestinal: Negative for abdominal pain, constipation, diarrhea, nausea and vomiting.  Genitourinary: Negative for bladder incontinence, difficulty urinating, dysuria, frequency and hematuria.   Musculoskeletal: Negative for back pain, gait problem, neck pain and neck stiffness.  Skin: Negative for itching and rash.  Neurological: Negative for dizziness, extremity weakness, gait problem, headaches, light-headedness and seizures.  Hematological: Negative for adenopathy. Does not bruise/bleed easily.  Psychiatric/Behavioral: Negative for confusion, depression and sleep disturbance. The patient is not nervous/anxious.     PHYSICAL EXAMINATION:  Blood pressure (!) 168/74, pulse 61, temperature 98.2 F (36.8 C), temperature source Temporal, resp. rate 15, height 5' 2.75" (1.594 m), weight 120 lb 11.2 oz (54.7 kg), SpO2 97 %.  ECOG PERFORMANCE STATUS: 1  Physical Exam  Constitutional: Oriented to person, place, and time and well-developed, well-nourished, and in no distress.  HENT:  Head: Normocephalic and atraumatic.  Mouth/Throat: Oropharynx is clear and moist. No oropharyngeal exudate.  Eyes: Conjunctivae are normal. Right eye exhibits no discharge. Left eye exhibits no discharge. No scleral icterus.  Neck: Normal range of motion. Neck supple.  Cardiovascular: Normal rate, regular rhythm, normal heart sounds and intact distal pulses.   Pulmonary/Chest: Effort normal and breath sounds normal. No respiratory distress. No wheezes. No rales.  Abdominal: Soft. Bowel sounds are normal. Exhibits no distension and no mass. There is no tenderness.  Musculoskeletal: Normal range of motion. Exhibits no edema.  Lymphadenopathy:    No cervical adenopathy.  Neurological: Alert and oriented to person, place, and time. Exhibits normal muscle tone. Gait normal. Coordination normal.  Skin: Skin is warm and dry. No rash noted. Not diaphoretic. No erythema. No pallor.   Psychiatric: Mood, memory and judgment normal.  Vitals reviewed.  LABORATORY DATA: Lab Results  Component Value Date   WBC 4.6 06/12/2022   HGB 13.2 06/12/2022   HCT 40.7 06/12/2022   MCV 84.1 06/12/2022   PLT 149 (L) 06/12/2022      Chemistry      Component Value Date/Time   NA 142 06/12/2022 0952   NA 139 12/05/2020 0949   NA 141 12/12/2016 0813   K 3.7 06/12/2022 0952   K 4.0 12/12/2016 0813   CL 105 06/12/2022 0952   CO2 30 06/12/2022 0952   CO2 26 12/12/2016 0813   BUN 19 06/12/2022 0952   BUN 16 12/05/2020 0949   BUN 20.3 12/12/2016 0813   CREATININE 1.22 (H) 06/12/2022 0952   CREATININE 1.46 (H) 08/05/2017 1639  CREATININE 1.3 (H) 12/12/2016 0813      Component Value Date/Time   CALCIUM 9.4 06/12/2022 0952   CALCIUM 8.9 12/12/2016 0813   ALKPHOS 61 06/12/2022 0952   ALKPHOS 55 12/12/2016 0813   AST 18 06/12/2022 0952   AST 13 12/12/2016 0813   ALT 8 06/12/2022 0952   ALT 10 12/12/2016 0813   BILITOT 0.8 06/12/2022 0952   BILITOT 0.55 12/12/2016 0813       RADIOGRAPHIC STUDIES:  CT Abdomen Pelvis W Contrast  Result Date: 06/13/2022 CLINICAL DATA:  81 year old female presenting for evaluation of hematologic malignancy, staging assessment. History of squamous cell carcinoma of the thigh and non-Hodgkin's lymphoma * Tracking Code: BO * EXAM: CT ABDOMEN AND PELVIS WITH CONTRAST TECHNIQUE: Multidetector CT imaging of the abdomen and pelvis was performed using the standard protocol following bolus administration of intravenous contrast. RADIATION DOSE REDUCTION: This exam was performed according to the departmental dose-optimization program which includes automated exposure control, adjustment of the mA and/or kV according to patient size and/or use of iterative reconstruction technique. CONTRAST:  72m OMNIPAQUE IOHEXOL 300 MG/ML  SOLN COMPARISON:  Dec 04, 2021. FINDINGS: Lower chest: No chest wall abnormality. Wise bases are clear. Cardiac structures are stable  to the extent evaluated. Hepatobiliary: No focal, suspicious hepatic lesion. No pericholecystic stranding. No biliary duct dilation. Portal vein is patent. Pancreas: Mild atrophy of the pancreas without ductal dilation, inflammation or visible lesion. Spleen: Spleen approximately 10 cm greatest craniocaudal dimension is stable. Hypodense area seen in the more central spleen posteriorly is nearly imperceptible on the current study perhaps measuring 5 mm in the coronal plane as compared to 13 mm in approximally 6 mm in the axial plane as compared to 19 mm. There are no new splenic lesions. Adrenals/Urinary Tract: Adrenal glands are normal. Symmetric renal enhancement with mild cortical scarring. No suspicious renal lesion. No hydronephrosis. Urinary bladder is collapsed. Stomach/Bowel: No acute gastrointestinal findings. Vascular/Lymphatic: Aortic atherosclerosis. No sign of aneurysm. Smooth contour of the IVC. There is no gastrohepatic or hepatoduodenal ligament lymphadenopathy. No retroperitoneal or mesenteric lymphadenopathy. No pelvic sidewall lymphadenopathy. Post lymphadenectomy in the RIGHT groin. Reproductive: Unremarkable by CT. Other: No ascites. Musculoskeletal: Spinal degenerative changes. Signs of spinal fusion. Grade 1 anterolisthesis of L5 on S1 similar to previous imaging. Fusion extending from L2 through L5. IMPRESSION: 1. Decreased size of splenic lesion, favors atypical vascular insult/small early infarct versus post infectious process. Area has improved. Consider attention on subsequent imaging. No new or progressive findings. 2. Post lymphadenectomy in the RIGHT groin. 3. Aortic atherosclerosis. Aortic Atherosclerosis (ICD10-I70.0). Electronically Signed   By: GZetta BillsM.D.   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  She is status post systemic chemotherapy with R-CHOP  then modified treatment secondary to steroid-induced psychosis as well as cardiomyopathy.  She was initially diagnosed in 2014.  The patient recently had a restaging CT scan performed.  Dr. MJulien Nordmannpersonally independently reviewed the scan and discussed the results with the patient today.  The scan showed no evidence of disease progression. The scan showed decreased size the the splenic lesion, favoring atypical vascular insult/small early infarct vs post infectious process. This area has improved.   We will see her back for follow-up visit in 6 months for evaluation and repeat blood work. We will arrange for a follow up CT scan in about 1 year from  now.   The patient was advised to call immediately if she has any concerning symptoms in the interval. The patient voices understanding of current disease status and treatment options and is in agreement with the current care plan. All questions were answered. The patient knows to call the clinic with any problems, questions or concerns. We can certainly see the patient much sooner if necessary       Orders Placed This Encounter  Procedures   CBC with Differential ([place] Only)    Standing Status:   Future    Standing Expiration Date:   06/14/2023   CMP ([place] only)    Standing Status:   Future    Standing Expiration Date:   06/14/2023   Lactate dehydrogenase (LDH)    Standing Status:   Future    Standing Expiration Date:   06/13/2023      Ylianna Almanzar L Issachar Broady, PA-C 06/13/22  ADDENDUM: Hematology/Oncology Attending:  I had a face-to-face encounter with the patient today.  I reviewed her records, lab, scan and recommended her care plan.  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  The patient was found to have a suspicious lesion in the spleen on the previous  imaging studies. I recommended for her to have repeat CT scan of the abdomen and pelvis that were performed recently and she is here for evaluation and discussion of her scan results. The patient is feeling fine but unfortunately she lost her husband few months ago. Her CT scan of the abdomen and pelvis showed no concerning findings and the suspicious lesion in the spleen had decreased in size and favor atypical vascular insult/small early infarct versus postinfectious process but it is improved. I recommended for the patient to continue on observation with repeat blood work in 6 months. She was advised to call immediately if she has any other concerning symptoms in the interval. The total time spent in the appointment was 30 minutes. Disclaimer: This note was dictated with voice recognition software. Similar sounding words can inadvertently be transcribed and may be missed upon review. Eilleen Kempf, MD

## 2022-06-12 ENCOUNTER — Other Ambulatory Visit: Payer: Self-pay

## 2022-06-12 ENCOUNTER — Inpatient Hospital Stay: Payer: Medicare Other | Attending: Physician Assistant

## 2022-06-12 ENCOUNTER — Ambulatory Visit (HOSPITAL_COMMUNITY)
Admission: RE | Admit: 2022-06-12 | Discharge: 2022-06-12 | Disposition: A | Discharge status: Home / Self Care | Payer: Medicare Other | Source: Ambulatory Visit | Attending: Internal Medicine | Admitting: Internal Medicine

## 2022-06-12 DIAGNOSIS — C8330 Diffuse large B-cell lymphoma, unspecified site: Secondary | ICD-10-CM | POA: Insufficient documentation

## 2022-06-12 DIAGNOSIS — D7389 Other diseases of spleen: Secondary | ICD-10-CM | POA: Diagnosis not present

## 2022-06-12 DIAGNOSIS — Z8572 Personal history of non-Hodgkin lymphomas: Secondary | ICD-10-CM

## 2022-06-12 DIAGNOSIS — Z7982 Long term (current) use of aspirin: Secondary | ICD-10-CM | POA: Insufficient documentation

## 2022-06-12 DIAGNOSIS — Z9221 Personal history of antineoplastic chemotherapy: Secondary | ICD-10-CM | POA: Insufficient documentation

## 2022-06-12 DIAGNOSIS — M8580 Other specified disorders of bone density and structure, unspecified site: Secondary | ICD-10-CM | POA: Insufficient documentation

## 2022-06-12 DIAGNOSIS — N2889 Other specified disorders of kidney and ureter: Secondary | ICD-10-CM | POA: Diagnosis not present

## 2022-06-12 LAB — CBC WITH DIFFERENTIAL (CANCER CENTER ONLY)
Abs Immature Granulocytes: 0.02 10*3/uL (ref 0.00–0.07)
Basophils Absolute: 0 10*3/uL (ref 0.0–0.1)
Basophils Relative: 0 %
Eosinophils Absolute: 0.1 10*3/uL (ref 0.0–0.5)
Eosinophils Relative: 1 %
HCT: 40.7 % (ref 36.0–46.0)
Hemoglobin: 13.2 g/dL (ref 12.0–15.0)
Immature Granulocytes: 0 %
Lymphocytes Relative: 30 %
Lymphs Abs: 1.4 10*3/uL (ref 0.7–4.0)
MCH: 27.3 pg (ref 26.0–34.0)
MCHC: 32.4 g/dL (ref 30.0–36.0)
MCV: 84.1 fL (ref 80.0–100.0)
Monocytes Absolute: 0.5 10*3/uL (ref 0.1–1.0)
Monocytes Relative: 11 %
Neutro Abs: 2.6 10*3/uL (ref 1.7–7.7)
Neutrophils Relative %: 58 %
Platelet Count: 149 10*3/uL — ABNORMAL LOW (ref 150–400)
RBC: 4.84 MIL/uL (ref 3.87–5.11)
RDW: 14.7 % (ref 11.5–15.5)
WBC Count: 4.6 10*3/uL (ref 4.0–10.5)
nRBC: 0 % (ref 0.0–0.2)

## 2022-06-12 LAB — CMP (CANCER CENTER ONLY)
ALT: 8 U/L (ref 0–44)
AST: 18 U/L (ref 15–41)
Albumin: 4.4 g/dL (ref 3.5–5.0)
Alkaline Phosphatase: 61 U/L (ref 38–126)
Anion gap: 7 (ref 5–15)
BUN: 19 mg/dL (ref 8–23)
CO2: 30 mmol/L (ref 22–32)
Calcium: 9.4 mg/dL (ref 8.9–10.3)
Chloride: 105 mmol/L (ref 98–111)
Creatinine: 1.22 mg/dL — ABNORMAL HIGH (ref 0.44–1.00)
GFR, Estimated: 45 mL/min — ABNORMAL LOW (ref >60)
Glucose, Bld: 84 mg/dL (ref 70–99)
Potassium: 3.7 mmol/L (ref 3.5–5.1)
Sodium: 142 mmol/L (ref 135–145)
Total Bilirubin: 0.8 mg/dL (ref 0.3–1.2)
Total Protein: 6.8 g/dL (ref 6.5–8.1)

## 2022-06-12 LAB — LACTATE DEHYDROGENASE: LDH: 208 U/L — ABNORMAL HIGH (ref 98–192)

## 2022-06-12 MED ORDER — IOHEXOL 300 MG/ML  SOLN
80.0000 mL | Freq: Once | INTRAMUSCULAR | Status: AC | PRN
  Administered 2022-06-12: 80 mL via INTRAVENOUS

## 2022-06-12 MED ORDER — SODIUM CHLORIDE (PF) 0.9 % IJ SOLN
INTRAMUSCULAR | Status: AC
  Filled 2022-06-12: qty 50

## 2022-06-13 ENCOUNTER — Inpatient Hospital Stay (HOSPITAL_BASED_OUTPATIENT_CLINIC_OR_DEPARTMENT_OTHER): Payer: Medicare Other | Admitting: Physician Assistant

## 2022-06-13 VITALS — BP 168/74 | HR 61 | Temp 98.2°F | Resp 15 | Ht 62.75 in | Wt 120.7 lb

## 2022-06-13 DIAGNOSIS — Z7982 Long term (current) use of aspirin: Secondary | ICD-10-CM | POA: Diagnosis not present

## 2022-06-13 DIAGNOSIS — Z8572 Personal history of non-Hodgkin lymphomas: Secondary | ICD-10-CM

## 2022-06-13 DIAGNOSIS — M8580 Other specified disorders of bone density and structure, unspecified site: Secondary | ICD-10-CM | POA: Diagnosis not present

## 2022-06-13 DIAGNOSIS — Z9221 Personal history of antineoplastic chemotherapy: Secondary | ICD-10-CM | POA: Diagnosis not present

## 2022-06-13 DIAGNOSIS — C8330 Diffuse large B-cell lymphoma, unspecified site: Secondary | ICD-10-CM | POA: Diagnosis not present

## 2022-06-14 ENCOUNTER — Ambulatory Visit: Payer: Medicare Other | Admitting: Internal Medicine

## 2022-06-14 NOTE — Progress Notes (Signed)
Cardiology Clinic Note   Patient Name: Tonya Wise Date of Encounter: 06/15/2022  Primary Care Provider:  Denita Lung, MD Primary Cardiologist:  Tonya Majestic, MD  Patient Profile    81 year old female with history of essential hypertension, Stage IIIb CKD (creatinine 1.22 06/12/2022), mixed hyperlipidemia, spinal stenosis with neurogenic claudication,and lumbar radiculopathy for retrolisthesis of L2 and L3, and history of B-cell lymphoma. Most recent echo 03/2020 EF of 53-64%, Grade I diastolic dysfunction,mild MR. Low risk cardiac NM stress test in 2014. Last seen by Tonya Wise on 12/12/2020. At that time BP was not well controlled and therefore amlodipine was increased to 5 mg daily. Her husband died in 02-11-2022 and she has seen PCP for anxiety and insomnia related to grief.   Past Medical History    Past Medical History:  Diagnosis Date   Allergy    Anemia    Ankle fracture, left    Chemotherapy induced cardiomyopathy ([place])    a. 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   Stroke ([place]) 2015   per pt while she was in hospital for Nonhodgkins Lymphoma   Past Surgical History:  Procedure Laterality Date   ABDOMINAL HYSTERECTOMY     ANTERIOR LAT LUMBAR FUSION N/A 06/28/2020   Procedure: Lumbar two-three Anterolateral lumbar interbody fusion with lateral plate;  Surgeon: Tonya Miss, MD;  Location: [facility];  Service: Neurosurgery;  Laterality: N/A;   APPENDECTOMY     BACK SURGERY     LOWER BACK TWICE   COLONOSCOPY  07/30/2006   ESOPHAGOGASTRODUODENOSCOPY N/A 03/29/2013   Procedure: ESOPHAGOGASTRODUODENOSCOPY (EGD);  Surgeon: Tonya Craver, MD;  Location[facility]  ENDOSCOPY;  Service: Endoscopy;  Laterality: N/A;   INGUINAL LYMPH NODE BIOPSY Right 03/31/2013   Procedure: INGUINAL LYMPH NODE BIOPSY;  Surgeon: Tonya Ober, MD;  Location: [place];  Service: General;  Laterality: Right;   ORIF ANKLE FRACTURE Left 11/03/2015   Procedure: OPEN REDUCTION INTERNAL FIXATION (ORIF) LEFT ANKLE FRACTURE;  Surgeon: Tonya Simmer, MD;  Location: [place];  Service: Orthopedics;  Laterality: Left;   SKIN BIOPSY Left 02/27/2022   well differenited squamous cell carcinoma   SPINE SURGERY      Allergies  Allergies  Allergen Reactions   Prednisone Other (See Comments)    "like being in a coma"   Penicillins Itching, Rash and Other (See Comments)    "burning sensation" Has patient had a PCN reaction causing immediate rash, facial/tongue/throat swelling, SOB or lightheadedness with hypotension: No Has patient had a PCN reaction causing severe rash involving mucus membranes or skin necrosis: No Has patient had a PCN reaction that required hospitalization No Has patient had a PCN reaction occurring within the last 10 years: No If all of the above answers are "NO", then may proceed with Cephalosporin use.    History of Present Illness    Tonya Wise presents today for ongoing assessment and management of hypertension, mixed hyperlipidemia, with other history of B-cell lymphoma, chronic kidney disease, and spinal stenosis.She was last seen in May 2022.   This very pleasant lady comes today without any cardiac complaints.  She  has noticed that her blood pressure remains labile depending upon what is going on with her at home.  She also notices that her blood pressure is usually elevated at the oncologist office.  She continues to be very busy and active at home working in her garden walking and raking leaves this time of year.  She does continue to have some complaints of insomnia.  She continues to grieve the death of her husband.  She has a good amount of  family support with daughters locally and sons in law, along with granddaughters all of whom check on her weekly and call her often.  Home Medications    Current Outpatient Medications  Medication Sig Dispense Refill   alendronate (FOSAMAX) 70 MG tablet TAKE 1 TABLET BY MOUTH EVERY 7 DAYS. TAKE WITH A FULL GLASS OF WATER ON AN EMPTY STOMACH 12 tablet 0   ALPRAZolam (XANAX) 0.25 MG tablet Take 1 tablet (0.25 mg total) by mouth 2 (two) times daily as needed for anxiety. 30 tablet 0   aspirin EC 81 MG tablet Take 1 tablet (81 mg total) by mouth 2 (two) times daily. (Patient taking differently: Take 81 mg by mouth daily.) 90 tablet 0   Melatonin 3 MG CAPS Take 3 mg by mouth at bedtime as needed (sleep).     RESTASIS 0.05 % ophthalmic emulsion Place 1 drop into both eyes 2 (two) times daily.      amLODipine (NORVASC) 5 MG tablet Take 1 tablet (5 mg total) by mouth daily. 90 tablet 3   atorvastatin (LIPITOR) 20 MG tablet Take 1 tablet (20 mg total) by mouth daily. 90 tablet 3   losartan (COZAAR) 100 MG tablet Take 1 tablet (100 mg total) by mouth daily. 90 tablet 3   metoprolol succinate (TOPROL-XL) 100 MG 24 hr tablet Take 1 tablet (100 mg total) by mouth daily. TAKE WITH OR IMMEDIATELY FOLLOWING A MEAL. 90 tablet 3   No current facility-administered medications for this visit.     Family History    Family History  Problem Relation Age of Onset   Heart attack Father 51   Leukemia Mother    Diabetes Brother    Diabetes Sister    She indicated that her mother is deceased. She indicated that her father is deceased. She indicated that the status of her sister is unknown. She indicated that the status of her brother is unknown.  Social History    Social History   Socioeconomic History   Marital status: Married    Spouse name: [name]   Number of children: 2   Years of education: 12   Highest education level: Not on file  Occupational History    Employer: [employer]  Tobacco Use    Smoking status: Never   Smokeless tobacco: Never  Substance and Sexual Activity   Alcohol use: No   Drug use: No   Sexual activity: Yes    Partners: Male  Other Topics Concern   Not on file  Social History Narrative   Married.  Lives in [place] with husband.     Social Determinants of Health   Financial Resource Strain: Low Risk  (06/01/2021)   Overall Financial Resource Strain (CARDIA)    Difficulty of Paying Living Expenses: Not hard at all  Food Insecurity: Not on file  Transportation Needs: No Transportation Needs (06/01/2021)   PRAPARE - Hydrologist (Medical): No    Lack of Transportation (Non-Medical): No  Physical Activity:  Not on file  Stress: Not on file  Social Connections: Not on file  Intimate Partner Violence: Not on file     Review of Systems    General:  No chills, fever, night sweats or weight changes.  Cardiovascular:  No chest pain, dyspnea on exertion, edema, orthopnea, palpitations, paroxysmal nocturnal dyspnea. Dermatological: No rash, lesions/masses Respiratory: No cough, dyspnea Urologic: No hematuria, dysuria Abdominal:   No nausea, vomiting, diarrhea, bright red blood per rectum, melena, or hematemesis Neurologic:  No visual changes, wkns, changes in mental status. All other systems reviewed and are otherwise negative except as noted above.     Physical Exam    VS:  BP 138/68 (BP Location: Left Arm, Patient Position: Sitting, Cuff Size: Normal)   Pulse (!) 52   Ht '5\' 3"'$  (1.6 m)   Wt 122 lb (55.3 kg)   SpO2 97%   BMI 21.61 kg/m  , BMI Body mass index is 21.61 kg/m.     GEN: Well nourished, well developed, in no acute distress. HEENT: normal. Neck: Supple, no JVD, carotid bruits, or masses. Cardiac: RRR, no murmurs, rubs, or gallops. No clubbing, cyanosis, edema.  Radials/DP/PT 2+ and equal bilaterally.  Respiratory:  Respirations regular and unlabored, clear to auscultation bilaterally. GI: Soft, nontender,  nondistended, BS + x 4. MS: no deformity or atrophy. Skin: warm and dry, no rash. Neuro:  Strength and sensation are intact. Psych: Normal affect.  Tearful at times.  Accessory Clinical Findings    ECG personally reviewed by me today-sinus bradycardia heart rate 52 bpm nonspecific ST-T wave abnormalities noted inferiorly.  Unchanged from prior EKG- No acute changes  Lab Results  Component Value Date   WBC 4.6 06/12/2022   HGB 13.2 06/12/2022   HCT 40.7 06/12/2022   MCV 84.1 06/12/2022   PLT 149 (L) 06/12/2022   Lab Results  Component Value Date   CREATININE 1.22 (H) 06/12/2022   BUN 19 06/12/2022   NA 142 06/12/2022   K 3.7 06/12/2022   CL 105 06/12/2022   CO2 30 06/12/2022   Lab Results  Component Value Date   ALT 8 06/12/2022   AST 18 06/12/2022   ALKPHOS 61 06/12/2022   BILITOT 0.8 06/12/2022   Lab Results  Component Value Date   CHOL 177 12/05/2020   HDL 56 12/05/2020   LDLCALC 90 12/05/2020   TRIG 183 (H) 12/05/2020   CHOLHDL 3.2 12/05/2020    Lab Results  Component Value Date   HGBA1C 5.4 01/14/2019    Review of Prior Studies: Echocardiogram 04/13/2020  1. Left ventricular ejection fraction, by estimation, is 55 to 60%. The  left ventricle has normal function. The left ventricle has no regional  wall motion abnormalities. Left ventricular diastolic parameters are  consistent with Grade I diastolic  dysfunction (impaired relaxation). The E/e' is 12.   2. Right ventricular systolic function is normal. The right ventricular  size is normal.   3. The mitral valve is abnormal. Mild mitral valve regurgitation.   4. The aortic valve is tricuspid. Aortic valve regurgitation is not  visualized.   5. The inferior vena cava is normal in size with greater than 50%  respiratory variability, suggesting right atrial pressure of 3 mmHg    Assessment & Plan   1.  Hypertension: Patient reports that it can be higher at times.  Usually depends on what is going on at  the time.  The patient states that when she goes to her oncologist office it  is elevated.  She takes her medications regularly and has no side effects.  Today blood pressures well controlled.  I have asked her to continue to keep up with her blood pressure daily and if it remains elevated consistently she may take an additional half a tablet of the amlodipine 2.5 mg dose, in addition to the 5 mg daily she is already taking.  Labs are due to be drawn in January by primary care.  Most recent labs were done in November by oncology.  Creatinine was 1.22 trending down from 1.30 with a GFR of 45.  2.  Hypercholesterolemia: She remains on atorvastatin 20 mg daily.  She is due to have labs drawn by PCP in January 2024.  3.  Situational anxiety and depression: Continues to grieve the loss of her husband 5 months ago to cancer.  She remains active and has excellent family support.  But she does have periods of emotional distress, with sadness and tearfulness.  But she states she is getting better over time.    Current medicines are reviewed at length with the patient today.  I have spent 25 min's  dedicated to the care of this patient on the date of this encounter to include pre-visit review of records, assessment, management and diagnostic testing,with shared decision making. Signed, Phill Myron. West Pugh, ANP, AACC   06/15/2022 8:24 AM      Office [phone] Fax [phone]  Notice: This dictation was prepared with Dragon dictation along with smaller phrase technology. Any transcriptional errors that result from this process are unintentional and may not be corrected upon review.

## 2022-06-15 ENCOUNTER — Ambulatory Visit: Payer: Medicare Other | Attending: Adult Health | Admitting: Adult Health

## 2022-06-15 ENCOUNTER — Encounter: Payer: Self-pay | Admitting: Adult Health

## 2022-06-15 VITALS — BP 138/68 | HR 52 | Ht 63.0 in | Wt 122.0 lb

## 2022-06-15 DIAGNOSIS — I10 Essential (primary) hypertension: Secondary | ICD-10-CM | POA: Diagnosis not present

## 2022-06-15 DIAGNOSIS — Z8572 Personal history of non-Hodgkin lymphomas: Secondary | ICD-10-CM | POA: Insufficient documentation

## 2022-06-15 DIAGNOSIS — E785 Hyperlipidemia, unspecified: Secondary | ICD-10-CM | POA: Insufficient documentation

## 2022-06-15 DIAGNOSIS — I700 Atherosclerosis of aorta: Secondary | ICD-10-CM | POA: Insufficient documentation

## 2022-06-15 MED ORDER — METOPROLOL SUCCINATE ER 100 MG PO TB24: 100.0000 mg | ORAL_TABLET | Freq: Every day | ORAL | 3 refills | Status: DC

## 2022-06-15 MED ORDER — LOSARTAN POTASSIUM 100 MG PO TABS: 100.0000 mg | ORAL_TABLET | Freq: Every day | ORAL | 3 refills | Status: DC

## 2022-06-15 MED ORDER — ATORVASTATIN CALCIUM 20 MG PO TABS: 20.0000 mg | ORAL_TABLET | Freq: Every day | ORAL | 3 refills | Status: DC

## 2022-06-15 MED ORDER — AMLODIPINE BESYLATE 5 MG PO TABS: 5.0000 mg | ORAL_TABLET | Freq: Every day | ORAL | 3 refills | Status: DC

## 2022-06-15 NOTE — Patient Instructions (Signed)
Medication Instructions:  Your physician recommends that you continue on your current medications as directed. Please refer to the Current Medication list given to you today.  *If you need a refill on your cardiac medications before your next appointment, please call your pharmacy*  Lab Work: NONE ordered at this time of appointment   If you have labs (blood work) drawn today and your tests are completely normal, you will receive your results only by: [place] (if you have MyChart) OR A paper copy in the mail If you have any lab test that is abnormal or we need to change your treatment, we will call you to review the results.  Testing/Procedures: NONE ordered at this time of appointment   Follow-Up: At [facility], you and your health needs are our priority.  As part of our continuing mission to provide you with exceptional heart care, we have created designated Provider Care Teams.  These Care Teams include your primary Cardiologist (physician) and Advanced Practice Providers (APPs -  Physician Assistants and Nurse Practitioners) who all work together to provide you with the care you need, when you need it.    Your next appointment:   1 year(s)  The format for your next appointment:   In Person  Provider:   Shelva Majestic, MD     Other Instructions  Important Information About Sugar

## 2022-07-02 ENCOUNTER — Telehealth: Payer: Self-pay | Admitting: Pharmacist

## 2022-07-02 NOTE — Chronic Care Management (AMB) (Unsigned)
Chronic Care Management Pharmacy Assistant   Name: Tonya Wise  MRN: [id number] DOB: July 27, 1941  Reason for Encounter: Disease State   Conditions to be addressed/monitored: HTN  Recent office visits:  None  Recent consult visits:  06/15/22 Lendon Colonel, NP (Cardiology) - Patient presented for Essential hypertension and other concerns. No medication changes.  06/13/22 Heilingoetter, Cassandra L, PA-C (Oncology) - Patient presented for History of B cell lymphoma. Stopped Glucosamine-Chondroit Vit C.  Hospital visits:  None in previous 6 months  Medications: Outpatient Encounter Medications as of 07/02/2022  Medication Sig   alendronate (FOSAMAX) 70 MG tablet TAKE 1 TABLET BY MOUTH EVERY 7 DAYS. TAKE WITH A FULL GLASS OF WATER ON AN EMPTY STOMACH   ALPRAZolam (XANAX) 0.25 MG tablet Take 1 tablet (0.25 mg total) by mouth 2 (two) times daily as needed for anxiety.   amLODipine (NORVASC) 5 MG tablet Take 1 tablet (5 mg total) by mouth daily.   aspirin EC 81 MG tablet Take 1 tablet (81 mg total) by mouth 2 (two) times daily. (Patient taking differently: Take 81 mg by mouth daily.)   atorvastatin (LIPITOR) 20 MG tablet Take 1 tablet (20 mg total) by mouth daily.   losartan (COZAAR) 100 MG tablet Take 1 tablet (100 mg total) by mouth daily.   Melatonin 3 MG CAPS Take 3 mg by mouth at bedtime as needed (sleep).   metoprolol succinate (TOPROL-XL) 100 MG 24 hr tablet Take 1 tablet (100 mg total) by mouth daily. TAKE WITH OR IMMEDIATELY FOLLOWING A MEAL.   RESTASIS 0.05 % ophthalmic emulsion Place 1 drop into both eyes 2 (two) times daily.    No facility-administered encounter medications on file as of 07/02/2022.   Reviewed chart prior to disease state call. Spoke with patient regarding BP  Recent Office Vitals: BP Readings from Last 3 Encounters:  06/15/22 138/68  06/13/22 (!) 168/74  03/14/22 120/72   Pulse Readings from Last 3 Encounters:  06/15/22 (!) 52  06/13/22 61   03/14/22 64    Wt Readings from Last 3 Encounters:  06/15/22 122 lb (55.3 kg)  06/13/22 120 lb 11.2 oz (54.7 kg)  03/14/22 118 lb 9.6 oz (53.8 kg)     Kidney Function Lab Results  Component Value Date/Time   CREATININE 1.22 (H) 06/12/2022 09:52 AM   CREATININE 1.25 (H) 12/12/2021 01:33 PM   CREATININE 1.46 (H) 08/05/2017 04:39 PM   CREATININE 1.3 (H) 12/12/2016 08:13 AM   CREATININE 1.3 (H) 06/13/2016 08:01 AM   GFRNONAA 45 (L) 06/12/2022 09:52 AM   GFRAA 39 (L) 08/17/2020 08:19 AM   GFRAA 42 (L) 12/07/2019 08:52 AM       Latest Ref Rng & Units 06/12/2022    9:52 AM 12/12/2021    1:33 PM 12/04/2021    8:12 AM  BMP  Glucose 70 - 99 mg/dL 84  93    BUN 8 - 23 mg/dL 19  12    Creatinine 0.44 - 1.00 mg/dL 1.22  1.25  1.30   Sodium 135 - 145 mmol/L 142  143    Potassium 3.5 - 5.1 mmol/L 3.7  3.6    Chloride 98 - 111 mmol/L 105  108    CO2 22 - 32 mmol/L 30  31    Calcium 8.9 - 10.3 mg/dL 9.4  8.9      Current antihypertensive regimen:  Amlodipine 5 mg 1 tablet daily - in AM Losartan 100 mg 1 tablet daily - in  AM Metoprolol succinate 100 mg 1 tablet daily - in AM How often are you checking your Blood Pressure? {CHL HP BP Monitoring Frequency:[phone]} Current home BP readings: *** What recent interventions/DTPs have been made by any provider to improve Blood Pressure control since last CPP Visit: *** Any recent hospitalizations or ED visits since last visit with CPP? {yes/no:20286} What diet changes have been made to improve Blood Pressure Control?  *** What exercise is being done to improve your Blood Pressure Control?  ***  Adherence Review: Is the patient currently on ACE/ARB medication? {yes/no:20286} Does the patient have >5 day gap between last estimated fill dates? {yes/no:20286}    Care Gaps: Diabetic Urine - Overdue CCM-   BP-  138/68 06/15/22 AWV- 03/14/22 Lab Results  Component Value Date   HGBA1C 5.4 01/14/2019    Star Rating  Drugs: Atorvastatin 20 mg - Last filled 05/13/22 90 DS at [facility] Losartan 100 mg - Last filled 05/20/22 90 DS at [facility] Pharmacist Assistant [phone]

## 2022-07-30 ENCOUNTER — Other Ambulatory Visit: Payer: Self-pay | Admitting: Family Medicine

## 2022-07-30 DIAGNOSIS — M810 Age-related osteoporosis without current pathological fracture: Secondary | ICD-10-CM

## 2022-08-01 ENCOUNTER — Ambulatory Visit (INDEPENDENT_AMBULATORY_CARE_PROVIDER_SITE_OTHER): Payer: Medicare Other | Admitting: Family Medicine

## 2022-08-01 ENCOUNTER — Encounter: Payer: Self-pay | Admitting: Family Medicine

## 2022-08-01 VITALS — BP 150/70 | HR 68 | Resp 14 | Wt 122.4 lb

## 2022-08-01 DIAGNOSIS — M1990 Unspecified osteoarthritis, unspecified site: Secondary | ICD-10-CM | POA: Diagnosis not present

## 2022-08-01 DIAGNOSIS — S76212A Strain of adductor muscle, fascia and tendon of left thigh, initial encounter: Secondary | ICD-10-CM

## 2022-08-01 NOTE — Progress Notes (Signed)
   Subjective:    Patient ID: Tonya Wise, female    DOB: 06/28/1941, 82 y.o.   MRN: [id number]  HPI She complains of a 1 month history of left inguinal pain.  She had been doing a lot of yard work with raking prior to this occurring and this has continued in spite of giving it some time.  She notes that especially when she lifts her leg off the ground she will feel pain in that area. She also complains of generalized arthritic pain especially in her hands.  Review of Systems     Objective:   Physical Exam Alert and in no distress.  Hip motion is normal.  Tender to palpation over the insertion of the abductor into the pubic bone.  No palpable lesion is noted. Exam of the hands does show arthritic changes in all the joints.      Assessment & Plan:  Strain of adductor magnus muscle of left lower extremity, initial encounter  Arthritis Heat for 20 minutes 3 times per day and 2 Aleve twice per day.  Do this for several weeks and if no improvement call me And then discussed care for arthritis secondary to Tylenol initially and then going to an NSAID if needed.

## 2022-08-01 NOTE — Patient Instructions (Signed)
Heat for 20 minutes 3 times per day and 2 Aleve twice per day.  Do this for several weeks and if no improvement call me

## 2022-08-16 DIAGNOSIS — C44629 Squamous cell carcinoma of skin of left upper limb, including shoulder: Secondary | ICD-10-CM | POA: Diagnosis not present

## 2022-08-16 DIAGNOSIS — Z85828 Personal history of other malignant neoplasm of skin: Secondary | ICD-10-CM | POA: Diagnosis not present

## 2022-09-03 ENCOUNTER — Other Ambulatory Visit: Payer: Self-pay | Admitting: Family Medicine

## 2022-09-03 DIAGNOSIS — F4321 Adjustment disorder with depressed mood: Secondary | ICD-10-CM

## 2022-09-03 DIAGNOSIS — M810 Age-related osteoporosis without current pathological fracture: Secondary | ICD-10-CM

## 2022-09-03 NOTE — Telephone Encounter (Signed)
Refill request last apt. 08/01/22 next apt 03/20/23.

## 2022-09-11 ENCOUNTER — Ambulatory Visit (INDEPENDENT_AMBULATORY_CARE_PROVIDER_SITE_OTHER): Payer: Medicare Other | Admitting: Family Medicine

## 2022-09-11 VITALS — BP 124/82 | HR 62 | Wt 125.2 lb

## 2022-09-11 DIAGNOSIS — R519 Headache, unspecified: Secondary | ICD-10-CM | POA: Diagnosis not present

## 2022-09-11 NOTE — Patient Instructions (Signed)
Take 2 Tylenol Extra Strength 3 times per day regularly for the next 10 days.  Call me after 10 days and let me know how you do.  If your headache does not go away then I am going to 1 ask questions like what, when, where and why

## 2022-09-11 NOTE — Progress Notes (Signed)
   Subjective:    Patient ID: JEANELLE DAKE, female    DOB: 01/04/1941, 82 y.o.   MRN: [id number]  HPI She is here for evaluation of headaches.  She does have a history of headaches her entire life that were nonspecific in nature and relieved with Tylenol.  She does state that she has noted over the last 2 weeks intermittent headaches sometimes not occurring at all on a daily basis and sometimes as many as twice per day.  They are sharp and retro-orbital in nature and constant.  They can last several hours to all day.  No blurred vision, double vision, nausea vomiting, falling in any direction or weakness.  She has not tried any medications with this.   Review of Systems     Objective:   Physical Exam Alert and in no distress.  EOMI.  Other cranial nerves grossly intact.  Normal DTRs and cerebellar.  Regular sinus rhythm without murmurs or gallops.  Lungs clear to auscultation.       Assessment & Plan:  Acute nonintractable headache, unspecified headache type Take 2 Tylenol Extra Strength 3 times per day regularly for the next 10 days.  Call me after 10 days and let me know how you do.  If your headache does not go away then I am going to 1 ask questions like what, when, where and why

## 2022-10-16 ENCOUNTER — Telehealth: Payer: Self-pay

## 2022-10-16 NOTE — Progress Notes (Signed)
Patient ID: Tonya Wise, female   DOB: [birth year], 82 y.o.   MRN: [id number]  Care Management & Coordination Services Pharmacy Team  Reason for Encounter: Hypertension  Contacted patient to discuss hypertension disease state. Spoke with patient on 10/18/2022     Current antihypertensive regimen:  Amlodipine 5 mg 1 tablet daily - in AM Losartan 100 mg 1 tablet daily - in AM Metoprolol succinate 100 mg 1 tablet daily - in AM  Patient verbally confirms she is taking the above medications as directed. Yes  How often are you checking your Blood Pressure? 1-2x per week  she checks her blood pressure in the afternoon and in the evening after taking her medication.  Current home BP readings: Patient reports her most recent was 150/80 at home and 160/74 in office on yesterday. She reports she is not adding any salts to her foods, nor does she cook with salt. She states she has been dealing with the loss of her husband and adjusting to life without him and also has been having headaches, she reports she had migraines as a child but they stopped for years and thinks the pain from it is why her pressure has been up.    Any readings above 180/100? No If yes any symptoms of hypertensive emergency? patient denies any symptoms of high blood pressure just headaches  What recent interventions/DTPs have been made by any provider to improve Blood Pressure control since last CPP Visit: Patient reports PCP has advised her to use tylenol for the headaches.   Any recent hospitalizations or ED visits since last visit with CPP? No   Adherence Review: Is the patient currently on ACE/ARB medication? Yes Does the patient have >5 day gap between last estimated fill dates? No  Star Rating Drugs:  Atorvastatin 20 mg - Last filled 07/29/22 90 [facility] Losartan 100 mg - Last filled 08/17/22 90 DS at [facility]  Care Gaps: AWV- 03/14/22 Diabetic Urine - Overdue COVID Booster - Postponed Pharm follow up -  7/23  Chart Updates: Recent office visits:  09/11/22 Ronnald Nian, MD - Patent presented for Acute non intractable headache unspecified type. Recommended 2 Extra strength Tylenol  08/01/22 Ronnald Nian, MD - Patient presented for Strain of adductor magnus muscle of left lower extremity and other concerns. No medication changes.  Recent consult visits:  None  Hospital visits:  None in previous 6 months  Medications: Outpatient Encounter Medications as of 10/16/2022  Medication Sig Note   alendronate (FOSAMAX) 70 MG tablet TAKE 1 TABLET BY MOUTH EVERY 7 DAYS. TAKE WITH A FULL GLASS OF WATER ON AN EMPTY STOMACH    ALPRAZolam (XANAX) 0.25 MG tablet TAKE 1 TABLET BY MOUTH 2 TIMES DAILY AS NEEDED FOR ANXIETY.    amLODipine (NORVASC) 5 MG tablet Take 1 tablet (5 mg total) by mouth daily.    aspirin EC 81 MG tablet Take 1 tablet (81 mg total) by mouth 2 (two) times daily. (Patient taking differently: Take 81 mg by mouth daily.)    atorvastatin (LIPITOR) 20 MG tablet Take 1 tablet (20 mg total) by mouth daily.    losartan (COZAAR) 100 MG tablet Take 1 tablet (100 mg total) by mouth daily.    Melatonin 3 MG CAPS Take 3 mg by mouth at bedtime as needed (sleep). (Patient not taking: Reported on 09/11/2022) 09/11/2022: prn   metoprolol succinate (TOPROL-XL) 100 MG 24 hr tablet Take 1 tablet (100 mg total) by mouth daily. TAKE WITH OR  IMMEDIATELY FOLLOWING A MEAL.    RESTASIS 0.05 % ophthalmic emulsion Place 1 drop into both eyes 2 (two) times daily.     No facility-administered encounter medications on file as of 10/16/2022.    Recent Office Vitals: BP Readings from Last 3 Encounters:  09/11/22 124/82  08/01/22 (!) 150/70  06/15/22 138/68   Pulse Readings from Last 3 Encounters:  09/11/22 62  08/01/22 68  06/15/22 (!) 52    Wt Readings from Last 3 Encounters:  09/11/22 125 lb 3.2 oz (56.8 kg)  08/01/22 122 lb 6.4 oz (55.5 kg)  06/15/22 122 lb (55.3 kg)     Kidney Function Lab  Results  Component Value Date/Time   CREATININE 1.22 (H) 06/12/2022 09:52 AM   CREATININE 1.25 (H) 12/12/2021 01:33 PM   CREATININE 1.46 (H) 08/05/2017 04:39 PM   CREATININE 1.3 (H) 12/12/2016 08:13 AM   CREATININE 1.3 (H) 06/13/2016 08:01 AM   GFRNONAA 45 (L) 06/12/2022 09:52 AM   GFRAA 39 (L) 08/17/2020 08:19 AM   GFRAA 42 (L) 12/07/2019 08:52 AM       Latest Ref Rng & Units 06/12/2022    9:52 AM 12/12/2021    1:33 PM 12/04/2021    8:12 AM  BMP  Glucose 70 - 99 mg/dL 84  93    BUN 8 - 23 mg/dL 19  12    Creatinine 4.25 - 1.00 mg/dL 9.56  3.87  5.64   Sodium 135 - 145 mmol/L 142  143    Potassium 3.5 - 5.1 mmol/L 3.7  3.6    Chloride 98 - 111 mmol/L 105  108    CO2 22 - 32 mmol/L 30  31    Calcium 8.9 - 10.3 mg/dL 9.4  8.9         Pamala Duffel CMA Clinical Pharmacist Assistant [phone]

## 2022-10-17 ENCOUNTER — Encounter: Payer: Self-pay | Admitting: Family Medicine

## 2022-10-17 ENCOUNTER — Ambulatory Visit (INDEPENDENT_AMBULATORY_CARE_PROVIDER_SITE_OTHER): Payer: Medicare Other | Admitting: Family Medicine

## 2022-10-17 VITALS — BP 160/74 | HR 58 | Temp 97.5°F | Resp 16 | Wt 125.6 lb

## 2022-10-17 DIAGNOSIS — R519 Headache, unspecified: Secondary | ICD-10-CM | POA: Diagnosis not present

## 2022-10-17 DIAGNOSIS — J309 Allergic rhinitis, unspecified: Secondary | ICD-10-CM | POA: Diagnosis not present

## 2022-10-17 NOTE — Patient Instructions (Signed)
Court nasal spray and your allergy pill daily for the last couple weeks and see what that we will do.  If you are still having trouble call and I will refer you to neurology

## 2022-10-17 NOTE — Progress Notes (Signed)
   Subjective:    Patient ID: Tonya Wise, female    DOB: [birth year], 82 y.o.   MRN: [id number]  HPI She is here for a recheck.  She did use Tylenol regularly but still had breakthroughs and describes the headaches as bitemporal and sharp that then reduces to just an aching pain but no blurred vision, double vision, nausea, vomiting, weakness, numbness or tingling.  She does have underlying allergies and does occasionally use an over-the-counter medication.  She does have occasional sneezing and coughing.   Review of Systems     Objective:   Physical Exam Alert and in no distress. Tympanic membranes and canals are normal. Pharyngeal area is normal. Neck is supple without adenopathy or thyromegaly.        Assessment & Plan:  Acute nonintractable headache, unspecified headache type  Allergic rhinitis, unspecified seasonality, unspecified trigger Recommend she treat the allergies more aggressively with antihistamine of choice as well as Rhinocort regularly for the next several weeks and if continued difficulty, I will then refer to neurology.

## 2022-11-12 ENCOUNTER — Telehealth: Payer: Self-pay | Admitting: Family Medicine

## 2022-11-12 DIAGNOSIS — R519 Headache, unspecified: Secondary | ICD-10-CM

## 2022-11-12 NOTE — Telephone Encounter (Signed)
Pt called and states that she continues to have headaches. She states that she has done all the things that Jcl recommended but they continue to be a problem. She states at her visit she was advised that if they continued she would need to be referred to neuro. Please advise pt at [phone].

## 2022-11-13 NOTE — Telephone Encounter (Signed)
Referral order placed. Patient advised.

## 2022-11-28 ENCOUNTER — Telehealth: Payer: Self-pay | Admitting: Internal Medicine

## 2022-11-28 NOTE — Telephone Encounter (Signed)
Called patient regarding May appointment, patient is notified. 

## 2022-12-02 ENCOUNTER — Other Ambulatory Visit: Payer: Self-pay | Admitting: Family Medicine

## 2022-12-02 DIAGNOSIS — M810 Age-related osteoporosis without current pathological fracture: Secondary | ICD-10-CM

## 2022-12-06 ENCOUNTER — Telehealth: Payer: Self-pay | Admitting: Internal Medicine

## 2022-12-06 NOTE — Telephone Encounter (Signed)
Called patient regarding May appointments, patiet is notified.

## 2022-12-12 ENCOUNTER — Inpatient Hospital Stay: Payer: Medicare Other | Admitting: Internal Medicine

## 2022-12-12 ENCOUNTER — Inpatient Hospital Stay: Payer: Medicare Other

## 2022-12-20 NOTE — Progress Notes (Signed)
[facility] OFFICE PROGRESS NOTE  Ronnald Nian, MD [address] 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   Oncology History  Non-Hodgkins lymphoma [facility]) (Resolved)  03/27/2013 - 03/31/2013 Hospital Admission   Admitted to [facility] for profound anemia (Hgb 6.4).  C/o fevers, drenching nightsweats, weight lost.  Negative colonoscopy and EGD.  CT of C/A/P demonstrated significant lymphadenopathy suspcious of ympha.     03/31/2013 Initial Diagnosis   Biopsy of R inguinal lymph node revealed Non-Hodgkins lymphoma. Diffuse Large B-cell Lymphoma   04/10/2013 - 04/24/2013 Hospital Admission   Hypercalcemia, renal failure and dehydration.  Started min-RCHOP.    04/15/2013 Bone Marrow Biopsy   Involvement with Diffuse Large B-cell Lymphoma. Stage IV NHL w B-symptoms.    04/18/2013 - 04/18/2013 Chemotherapy   Mini R-CHOP Cycle #1 on 04/18/2013. Dose reduced to poor nutrion. It consisted on Cytoxan 400 mg/m2,Doxorubicin 25 mg/m2, Rituximab 375 mg/m2, Vincristine 1mg .  Prednisone 100 mg daily for 9/13 - 9/16.   Received neupogen on 9/21, 9/27 -9/30.     04/25/2013 - 04/29/2013 Hospital Admission   Admitted due to worsening mouth sores/oral thrush, odynophagia, failure to thrive.  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  Discharge to [facility] (Dr. Angelina Ok) and discharged to home on 05/19/2013.     06/01/2013 - 06/01/2013 Chemotherapy   Mini R-CHO #2 dosed as above.  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  Received neulasta shot on 06/02/13   06/16/2013 Cancer Staging   Re-staging PET/CT demonstrated significant interval reduction in axillary lymphadenopathy.  There was 1 small residual right axillary lymph node measuring 16 x 12 mm which showed FDG uptake.  Signifcant reduction in mediastinal and hilar lymphadenopathy.    06/16/2013  Imaging   CT of chest noted left-sided pulmonary embolim. Started on lovenox to coumadin bridge.     06/22/2013 - 06/22/2013 Chemotherapy   Mini-R-CHO #3.  Received Neulasta on 06/23/2013.     07/16/2013 Imaging   2-D Echocardiogram demonstrates 35-45% EF with Grade 2 diastolic dysfunction.  Doxorubicin discontinued with consultation by cardiology (Dr. Tresa Endo).     08/19/2013 - 08/21/2013 Chemotherapy   Mini-R-CEO #4.  Etoposide 25 mg/m2 instead of doxo due to above.  Etoposide 50mg  bid on day 2/3.  On day 3 (08/21/2013) she received 1 of two oral doses of ectoposide.    08/21/2013 - 08/26/2013 Hospital Admission   Admitted to [facility] due to acute respiratory failure and had influenzae A and pneumonia. Completed tamiflu and oral antibiotics.    09/25/2013 - 09/27/2013 Chemotherapy   Mini-R-CEO #5. Etoposide 25 mg/m2 25 mg bid on days 2/3.    10/16/2013 - 10/19/2013 Chemotherapy   Mini-R-CEO # 6. Etoposide 25 mg/m2 bid on days 2/3. Neulasta on day#4.    11/05/2013 Imaging   Interval resolution of the hypermetabolism identified within the enlarged right axillary lymph node seen previously. This lymph node has also decreased markedly in size in the interval. No new hypermetabolic lesions in the neck, chest, abdomen, or  pelvis.    11/19/2013 Bone Marrow Biopsy   There is no evidence of a B-cell lymphoproliferative process in this material. Normal cytogenetics.    04/16/2014 Imaging   CT scan Chest, abdomen: No evidence of recurrent lymphoma within the chest or abdomen.   Significant decrease in splenomegaly since prior exam.      CURRENT THERAPY: Observation   INTERVAL HISTORY: Tonya Wise 82 y.o. female returns to the clinic today for a 69-month follow-up visit.  Since last being seen, she denies changes in her health and is feeling well except for some headaches. This started after her husband died.  He passed away in 01-16-22.  She localizes her headaches to behind her eyes and temples.   She is not sure if this is stress related or not as she has been very busy taking care of the home and landscaping for her home.  She reports that the headaches occur 4-5 times per week.  She rates her pain an 8 out of 9 and describes it as a pounding sensation.  She takes Tylenol which helps ease her symptoms.  She does have a history of migraines.  Denies any other associated symptoms such as speech changes, any weakness, vision changes, numbness and tingling, falls, etc.   Denies any fever, chills, night sweats, or lymphadenopathy. She stays very active at home with yard work and cleaning. Denies any early satiety, abdominal pain, or bloating.  Denies any recent or frequent infections.  Denies any abnormal bleeding. She is here for evaluation and repeat blood work.   MEDICAL HISTORY: Past Medical History:  Diagnosis Date   Allergy    Anemia    Ankle fracture, left    Chemotherapy induced cardiomyopathy (HCC)    a. 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  MEDICATIONS:  Current Outpatient Medications  Medication Sig Dispense Refill   alendronate (FOSAMAX) 70 MG tablet TAKE 1 TABLET BY MOUTH EVERY 7 DAYS. TAKE WITH A FULL GLASS OF WATER ON AN EMPTY STOMACH 12 tablet 0   ALPRAZolam (XANAX) 0.25 MG tablet TAKE 1 TABLET BY MOUTH 2 TIMES DAILY AS NEEDED FOR ANXIETY. 30 tablet 0   amLODipine (NORVASC) 5 MG tablet Take 1 tablet (5 mg total) by mouth daily. 90 tablet 3   aspirin EC 81 MG tablet Take 1 tablet (81 mg total) by mouth 2 (two) times  daily. (Patient taking differently: Take 81 mg by mouth daily.) 90 tablet 0   atorvastatin (LIPITOR) 20 MG tablet Take 1 tablet (20 mg total) by mouth daily. 90 tablet 3   losartan (COZAAR) 100 MG tablet Take 1 tablet (100 mg total) by mouth daily. 90 tablet 3   Melatonin 3 MG CAPS Take 3 mg by mouth at bedtime as needed (sleep).     metoprolol succinate (TOPROL-XL) 100 MG 24 hr tablet Take 1 tablet (100 mg total) by mouth daily. TAKE WITH OR IMMEDIATELY FOLLOWING A MEAL. 90 tablet 3   RESTASIS 0.05 % ophthalmic emulsion Place 1 drop into both eyes 2 (two) times daily.      No current facility-administered medications for this visit.    SURGICAL HISTORY:  Past Surgical History:  Procedure Laterality Date   ABDOMINAL HYSTERECTOMY     ANTERIOR LAT LUMBAR FUSION N/A 06/28/2020   Procedure: Lumbar two-three Anterolateral lumbar interbody fusion with lateral plate;  Surgeon:  Barnett Abu, MD;  Location: [facility] OR;  Service: Neurosurgery;  Laterality: N/A;   APPENDECTOMY     BACK SURGERY     LOWER BACK TWICE   COLONOSCOPY  07/30/2006   ESOPHAGOGASTRODUODENOSCOPY N/A 03/29/2013   Procedure: ESOPHAGOGASTRODUODENOSCOPY (EGD);  Surgeon: Charna Elizabeth, MD;  Location: [facility] ENDOSCOPY;  Service: Endoscopy;  Laterality: N/A;   INGUINAL LYMPH NODE BIOPSY Right 03/31/2013   Procedure: INGUINAL LYMPH NODE BIOPSY;  Surgeon: Cherylynn Ridges, MD;  Location: [facility] OR;  Service: General;  Laterality: Right;   ORIF ANKLE FRACTURE Left 11/03/2015   Procedure: OPEN REDUCTION INTERNAL FIXATION (ORIF) LEFT ANKLE FRACTURE;  Surgeon: Toni Arthurs, MD;  Location:  SURGERY CENTER;  Service: Orthopedics;  Laterality: Left;   SKIN BIOPSY Left 02/27/2022   well differenited squamous cell carcinoma   SPINE SURGERY      REVIEW OF SYSTEMS:   Review of Systems  Constitutional: Negative for appetite change, chills, fatigue, fever and unexpected weight change.  HENT: Negative for mouth sores, nosebleeds, sore throat and  trouble swallowing.   Eyes: Negative for eye problems and icterus.  Respiratory: Negative for cough, hemoptysis, shortness of breath and wheezing.   Cardiovascular: Negative for chest pain and leg swelling.  Gastrointestinal: Negative for abdominal pain, constipation, diarrhea, nausea and vomiting.  Genitourinary: Negative for bladder incontinence, difficulty urinating, dysuria, frequency and hematuria.   Musculoskeletal: Negative for back pain, gait problem, neck pain and neck stiffness.  Skin: Negative for itching and rash.  Neurological: Positive for headaches. Negative for dizziness, extremity weakness, gait problem, light-headedness and seizures.  Hematological: Negative for adenopathy. Does not bruise/bleed easily.  Psychiatric/Behavioral: Negative for confusion, depression and sleep disturbance. The patient is not nervous/anxious.     PHYSICAL EXAMINATION:  Blood pressure (!) 163/73, pulse (!) 57, temperature 97.7 F (36.5 C), temperature source Oral, resp. rate 13, weight 127 lb (57.6 kg), SpO2 98 %.  ECOG PERFORMANCE STATUS: 0  Physical Exam  Constitutional: Oriented to person, place, and time and well-developed, well-nourished, and in no distress.  HENT:  Head: Normocephalic and atraumatic.  Mouth/Throat: Oropharynx is clear and moist. No oropharyngeal exudate.  Eyes: Conjunctivae are normal. Right eye exhibits no discharge. Left eye exhibits no discharge. No scleral icterus.  Neck: Normal range of motion. Neck supple.  Cardiovascular: Normal rate, regular rhythm, normal heart sounds and intact distal pulses.   Pulmonary/Chest: Effort normal and breath sounds normal. No respiratory distress. No wheezes. No rales.  Abdominal: Soft. Bowel sounds are normal. Exhibits no distension and no mass. There is no tenderness.  Musculoskeletal: Normal range of motion. Exhibits no edema.  Lymphadenopathy:    No cervical adenopathy.  Neurological: Alert and oriented to person, place, and  time. Exhibits normal muscle tone. Gait normal. Coordination normal.  Skin: Skin is warm and dry. No rash noted. Not diaphoretic. No erythema. No pallor.  Psychiatric: Mood, memory and judgment normal.  Vitals reviewed.  LABORATORY DATA: Lab Results  Component Value Date   WBC 4.4 12/26/2022   HGB 12.8 12/26/2022   HCT 39.1 12/26/2022   MCV 85.4 12/26/2022   PLT 126 (L) 12/26/2022      Chemistry      Component Value Date/Time   NA 144 12/26/2022 0848   NA 139 12/05/2020 0949   NA 141 12/12/2016 0813   K 3.9 12/26/2022 0848   K 4.0 12/12/2016 0813   CL 108 12/26/2022 0848   CO2 30 12/26/2022 0848   CO2 26 12/12/2016 0813   BUN  13 12/26/2022 0848   BUN 16 12/05/2020 0949   BUN 20.3 12/12/2016 0813   CREATININE 1.22 (H) 12/26/2022 0848   CREATININE 1.46 (H) 08/05/2017 1639   CREATININE 1.3 (H) 12/12/2016 0813      Component Value Date/Time   CALCIUM 8.9 12/26/2022 0848   CALCIUM 8.9 12/12/2016 0813   ALKPHOS 60 12/26/2022 0848   ALKPHOS 55 12/12/2016 0813   AST 22 12/26/2022 0848   AST 13 12/12/2016 0813   ALT 16 12/26/2022 0848   ALT 10 12/12/2016 0813   BILITOT 0.5 12/26/2022 0848   BILITOT 0.55 12/12/2016 0813       RADIOGRAPHIC STUDIES:  No results found.   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  She is status post systemic chemotherapy with R-CHOP then modified treatment secondary to steroid-induced psychosis as well as cardiomyopathy.   She was initially diagnosed in 2014.    Labs were reviewed. Recommend that she continue on observation. No clinical or laboratory evidence of disease progression.   We will see her back for labs and follow up in 6 months with a repeat CT scan a few days before.  I spoke to Dr. Arbutus Ped regarding her headaches.  Dr. Arbutus Ped does not feel that this is related to her history of non-Hodgkin's lymphoma.  Her headaches could be a  wide range of differential diagnoses and Dr. Arbutus Ped would recommend that she follow-up with her PCP for reevaluation of her headaches.  She is currently taking Tylenol which eases her symptoms.  The patient does not have any associated neurological complaints such as vision changes, extremity weakness, speech changes, falls, or vertigo.  We reviewed signs and symptoms that would warrant emergency evaluation for headache such as irretractable/severe headaches, or headaches with associated neurological symptoms such as falls, extremity weakness, numbness, tingling, vision changes, speech changes, or changes in mentation.  The patient was advised to call immediately if she has any concerning symptoms in the interval. The patient voices understanding of current disease status and treatment options and is in agreement with the current care plan. All questions were answered. The patient knows to call the clinic with any problems, questions or concerns. We can certainly see the patient much sooner if necessary         Orders Placed This Encounter  Procedures   CT CHEST ABDOMEN PELVIS W CONTRAST    Standing Status:   Future    Standing Expiration Date:   12/26/2023    Order Specific Question:   If indicated for the ordered procedure, I authorize the administration of contrast media per Radiology protocol    Answer:   Yes    Order Specific Question:   Does the patient have a contrast media/X-ray dye allergy?    Answer:   No    Order Specific Question:   Preferred imaging location?    Answer:   [facility]    Order Specific Question:   If indicated for the ordered procedure, I authorize the administration of oral contrast media per Radiology protocol    Answer:   Yes   CBC with Differential (Cancer Center Only)    Standing Status:   Future    Standing Expiration Date:   12/26/2023   CMP (Cancer Center only)    Standing Status:   Future    Standing Expiration Date:   12/26/2023   Lactate  dehydrogenase (LDH)  Standing Status:   Future    Standing Expiration Date:   12/26/2023     The total time spent in the appointment was 20-29 minutes.   Teri Diltz L Nicollette Wilhelmi, PA-C 12/26/22

## 2022-12-25 ENCOUNTER — Other Ambulatory Visit: Payer: Self-pay | Admitting: Physician Assistant

## 2022-12-25 DIAGNOSIS — Z8572 Personal history of non-Hodgkin lymphomas: Secondary | ICD-10-CM

## 2022-12-26 ENCOUNTER — Inpatient Hospital Stay: Payer: Medicare Other | Attending: Physician Assistant

## 2022-12-26 ENCOUNTER — Inpatient Hospital Stay (HOSPITAL_BASED_OUTPATIENT_CLINIC_OR_DEPARTMENT_OTHER): Payer: Medicare Other | Admitting: Physician Assistant

## 2022-12-26 ENCOUNTER — Other Ambulatory Visit: Payer: Self-pay

## 2022-12-26 VITALS — BP 163/73 | HR 57 | Temp 97.7°F | Resp 13 | Wt 127.0 lb

## 2022-12-26 DIAGNOSIS — Z8572 Personal history of non-Hodgkin lymphomas: Secondary | ICD-10-CM | POA: Diagnosis not present

## 2022-12-26 DIAGNOSIS — E7439 Other disorders of intestinal carbohydrate absorption: Secondary | ICD-10-CM | POA: Diagnosis not present

## 2022-12-26 DIAGNOSIS — C8335 Diffuse large B-cell lymphoma, lymph nodes of inguinal region and lower limb: Secondary | ICD-10-CM | POA: Insufficient documentation

## 2022-12-26 LAB — CBC WITH DIFFERENTIAL (CANCER CENTER ONLY)
Abs Immature Granulocytes: 0.02 10*3/uL (ref 0.00–0.07)
Basophils Absolute: 0 10*3/uL (ref 0.0–0.1)
Basophils Relative: 1 %
Eosinophils Absolute: 0.1 10*3/uL (ref 0.0–0.5)
Eosinophils Relative: 2 %
HCT: 39.1 % (ref 36.0–46.0)
Hemoglobin: 12.8 g/dL (ref 12.0–15.0)
Immature Granulocytes: 1 %
Lymphocytes Relative: 29 %
Lymphs Abs: 1.3 10*3/uL (ref 0.7–4.0)
MCH: 27.9 pg (ref 26.0–34.0)
MCHC: 32.7 g/dL (ref 30.0–36.0)
MCV: 85.4 fL (ref 80.0–100.0)
Monocytes Absolute: 0.4 10*3/uL (ref 0.1–1.0)
Monocytes Relative: 9 %
Neutro Abs: 2.6 10*3/uL (ref 1.7–7.7)
Neutrophils Relative %: 58 %
Platelet Count: 126 10*3/uL — ABNORMAL LOW (ref 150–400)
RBC: 4.58 MIL/uL (ref 3.87–5.11)
RDW: 13.8 % (ref 11.5–15.5)
WBC Count: 4.4 10*3/uL (ref 4.0–10.5)
nRBC: 0 % (ref 0.0–0.2)

## 2022-12-26 LAB — CMP (CANCER CENTER ONLY)
ALT: 16 U/L (ref 0–44)
AST: 22 U/L (ref 15–41)
Albumin: 4 g/dL (ref 3.5–5.0)
Alkaline Phosphatase: 60 U/L (ref 38–126)
Anion gap: 6 (ref 5–15)
BUN: 13 mg/dL (ref 8–23)
CO2: 30 mmol/L (ref 22–32)
Calcium: 8.9 mg/dL (ref 8.9–10.3)
Chloride: 108 mmol/L (ref 98–111)
Creatinine: 1.22 mg/dL — ABNORMAL HIGH (ref 0.44–1.00)
GFR, Estimated: 45 mL/min — ABNORMAL LOW (ref >60)
Glucose, Bld: 129 mg/dL — ABNORMAL HIGH (ref 70–99)
Potassium: 3.9 mmol/L (ref 3.5–5.1)
Sodium: 144 mmol/L (ref 135–145)
Total Bilirubin: 0.5 mg/dL (ref 0.3–1.2)
Total Protein: 6.3 g/dL — ABNORMAL LOW (ref 6.5–8.1)

## 2022-12-26 LAB — LACTATE DEHYDROGENASE: LDH: 188 U/L (ref 98–192)

## 2023-01-02 DIAGNOSIS — H35039 Hypertensive retinopathy, unspecified eye: Secondary | ICD-10-CM | POA: Insufficient documentation

## 2023-01-08 DIAGNOSIS — H35033 Hypertensive retinopathy, bilateral: Secondary | ICD-10-CM | POA: Diagnosis not present

## 2023-01-08 DIAGNOSIS — H26492 Other secondary cataract, left eye: Secondary | ICD-10-CM | POA: Diagnosis not present

## 2023-01-08 DIAGNOSIS — Z961 Presence of intraocular lens: Secondary | ICD-10-CM | POA: Diagnosis not present

## 2023-01-08 DIAGNOSIS — H04123 Dry eye syndrome of bilateral lacrimal glands: Secondary | ICD-10-CM | POA: Diagnosis not present

## 2023-01-24 DIAGNOSIS — L570 Actinic keratosis: Secondary | ICD-10-CM | POA: Diagnosis not present

## 2023-01-24 DIAGNOSIS — Z85828 Personal history of other malignant neoplasm of skin: Secondary | ICD-10-CM | POA: Diagnosis not present

## 2023-01-24 DIAGNOSIS — D1801 Hemangioma of skin and subcutaneous tissue: Secondary | ICD-10-CM | POA: Diagnosis not present

## 2023-01-24 DIAGNOSIS — L821 Other seborrheic keratosis: Secondary | ICD-10-CM | POA: Diagnosis not present

## 2023-01-24 DIAGNOSIS — D2261 Melanocytic nevi of right upper limb, including shoulder: Secondary | ICD-10-CM | POA: Diagnosis not present

## 2023-01-24 DIAGNOSIS — L578 Other skin changes due to chronic exposure to nonionizing radiation: Secondary | ICD-10-CM | POA: Diagnosis not present

## 2023-01-24 DIAGNOSIS — L814 Other melanin hyperpigmentation: Secondary | ICD-10-CM | POA: Diagnosis not present

## 2023-01-28 ENCOUNTER — Encounter: Payer: Self-pay | Admitting: Neurology

## 2023-01-28 ENCOUNTER — Ambulatory Visit (INDEPENDENT_AMBULATORY_CARE_PROVIDER_SITE_OTHER): Payer: Medicare Other | Admitting: Neurology

## 2023-01-28 VITALS — BP 142/76 | HR 74 | Ht 62.0 in | Wt 127.0 lb

## 2023-01-28 DIAGNOSIS — G441 Vascular headache, not elsewhere classified: Secondary | ICD-10-CM | POA: Diagnosis not present

## 2023-01-28 DIAGNOSIS — M542 Cervicalgia: Secondary | ICD-10-CM

## 2023-01-28 DIAGNOSIS — I6782 Cerebral ischemia: Secondary | ICD-10-CM

## 2023-01-28 DIAGNOSIS — C8590 Non-Hodgkin lymphoma, unspecified, unspecified site: Secondary | ICD-10-CM | POA: Diagnosis not present

## 2023-01-28 DIAGNOSIS — R519 Headache, unspecified: Secondary | ICD-10-CM | POA: Insufficient documentation

## 2023-01-28 MED ORDER — VERAPAMIL HCL ER 120 MG PO TBCR: 120.0000 mg | EXTENDED_RELEASE_TABLET | Freq: Every day | ORAL | 1 refills | Status: DC

## 2023-01-28 MED ORDER — AMITRIPTYLINE HCL 25 MG PO TABS: 25.0000 mg | ORAL_TABLET | Freq: Every day | ORAL | 3 refills | Status: DC

## 2023-01-28 NOTE — Progress Notes (Addendum)
NEUROLOGY CLINIC    Provider:  Melvyn Novas, MD  Primary Care Physician:  Ronnald Nian, MD [address] [place] [address]     Referring Provider: Ronnald Nian, Md [address] [place],  [place] [address]          Chief Complaint according to patient   Patient presents with:     New Patient (Initial Visit)     Hre fort headaches, pressure in both temples . Sometimes nausea.  NEW HEADACHE CONSULT alone, rm 2. Pt states here today for headaches that have been ongoing. When she lays flat, Headache will come on suddenly. She states it feels like she may pass out. When she wakes up in the morning she has to sit up on the side of the bed a little bit before she stands up. She states it is all the time.      HISTORY OF PRESENT ILLNESS:  Tonya Wise is a 82 y.o. female patient who is seen upon referral on 01/28/2023 from Dr Susann Givens for a Headache evaluation Chief concern according to patient :  Headaches unrelated to hydration, to meal times, to sleep hours - onset 20 plus years ago.   .This patient had seen seen by Dr Everlena Cooper in 2015, and she described an evolution of headaches since then- much more localized and less global. Pounding  and pulsating headaches,  no vision changes with headaches and recent ophthalmological  testing was reportedly normal. No photophobia.  She has high BP readings in most medical visit.  She is on 3 anti hypertensive treatment.  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 Anxiety after her husband passed. She had cancer  ( B cell Lymphoma ) and she never had skin infiltrations.  Squamous cell carcinoma of the skin.   Family history: no other family member with headache.     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 She has a one acre lot and large home that she tends to.  Family status is widowed , with 2 daughters and 2  granddaughters.Pets are not  present.  Tobacco use; none. ETOH use; none , Caffeine intake in form of Coffee( 1 cup max, I usually decaffeinate ) Soda( /) Tea ( /) energy drinks Exercise in form of gardening.- golf- walking. Hobbies :gardening , she  used to sail, stayed in her beach house.     Sleep habits are as follows: she rises at 6 AM , she eats at 10.30 brunch, The patient's dinner time is between 5-6 PM. The patient goes to bed at 10-11 PM and continues to sleep for 7 hours, wakes for for one or two bathrooms break. Some nights she will feel intense headaches coming on to a stage where she feels fainting, she has to breathe through them, she never has passed out form these headaches.  She reports lightheadedness when rising from seated position. She feels a bit vertiginous.    HISTORY OF PRESENT ILLNESS 11/  2015 : Tonya Wise is a 82 year old right-handed woman with history of Non-Hodgkins lymphoma (finished chemo in March 2015), anemia of chronic disease, hypertension, cardiomyopathy, chronic systolic and diastolic heart failure, pulmonary embolism, and pneumonia who presents for headache.  Records and labs personally reviewed.   Onset:  Summer 2015 Location:  Bi-temporal and retro-orbital Quality:  1) sharp pain, 2) nonthrobbing ache Intensity:  1)10/10, 2) aching pain 6-7/10 Aura:  no Prodrome:  no Associated symptoms:  No nausea, vomiting, photophobia, phonophobia or autonomic symptoms.   Duration:  Sharp pain lasts 1 hour.  Aching pain lasts all day Frequency:  Daily (sharp pain occurs 1-2 times a week) Triggers/exacerbating factors:  no Relieving factors:  no Activity:  Can't function with sharp pain   Past abortive therapy: none Past preventative therapy:  none   Current abortive therapy:  Takes something every day.  Tylenol (3-4 times a week, ibuprofen (1-2x/week)  Current preventative therapy:  none Other medications:  Lopressor 37.5mg  twice daily, omeprazole    Since onset of these symptoms, she has had constant monocular blurred vision.  She last had an eye exam a year ago and was told that her vision was normal (only needed some reading glasses which she does not usually need).  She also feels unsteady on her feet.  Her head feels "light".  She also reports some spinning sensation at times when lying in bed.     Review of Systems: Out of a complete 14 system review, the patient complains of only the following symptoms, and all other reviewed systems are negative.:    See headache ROS  Social History   Socioeconomic History   Marital status: Married    Spouse name: [name]   Number of children: 2   Years of education: 12   Highest education level: Not on file  Occupational History    Employer: [employer]  Tobacco Use   Smoking status: Never   Smokeless tobacco: Never  Substance and Sexual Activity   Alcohol use: No   Drug use: No   Sexual activity: Yes    Partners: Male  Other Topics Concern   Not on file  Social History Narrative   Married.  Lives in [place] with husband.     Social Determinants of Health   Financial Resource Strain: Low Risk  (06/01/2021)   Overall Financial Resource Strain (CARDIA)    Difficulty of Paying Living Expenses: Not hard at all  Food Insecurity: Not on file  Transportation Needs: No Transportation Needs (06/01/2021)   PRAPARE - Administrator, Civil Service (Medical): No    Lack of Transportation (Non-Medical): No  Physical Activity: Not on file  Stress: Not on file  Social Connections: Not on file    Family History  Problem Relation Age of Onset   Heart attack Father 57   Leukemia Mother    Diabetes Brother    Diabetes Sister     Past Medical History:  Diagnosis Date   Allergy    Anemia    Ankle fracture, left    Chemotherapy induced cardiomyopathy (HCC)    a. 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   Stroke (HCC) 2015   per pt while she was in hospital for Nonhodgkins Lymphoma    Past Surgical History:  Procedure Laterality Date   ABDOMINAL HYSTERECTOMY     ANTERIOR LAT LUMBAR FUSION N/A 06/28/2020   Procedure: Lumbar two-three Anterolateral lumbar interbody fusion with lateral plate;  Surgeon: Barnett Abu, MD;  Location: [facility]  OR;  Service: Neurosurgery;  Laterality: N/A;   APPENDECTOMY     BACK SURGERY     LOWER BACK TWICE   COLONOSCOPY  07/30/2006   ESOPHAGOGASTRODUODENOSCOPY N/A 03/29/2013   Procedure: ESOPHAGOGASTRODUODENOSCOPY (EGD);  Surgeon: Charna Elizabeth, MD;  Location: [facility] ENDOSCOPY;  Service: Endoscopy;  Laterality: N/A;   INGUINAL LYMPH NODE BIOPSY Right 03/31/2013   Procedure: INGUINAL LYMPH NODE BIOPSY;  Surgeon: Cherylynn Ridges, MD;  Location: [facility] OR;  Service: General;  Laterality: Right;   ORIF ANKLE FRACTURE Left 11/03/2015   Procedure: OPEN REDUCTION INTERNAL FIXATION (ORIF) LEFT ANKLE FRACTURE;  Surgeon: Toni Arthurs, MD;  Location: [facility];  Service: Orthopedics;  Laterality: Left;   SKIN BIOPSY Left 02/27/2022   well differenited squamous cell carcinoma   SPINE SURGERY       Current Outpatient Medications on File Prior to Visit  Medication Sig Dispense Refill   alendronate (FOSAMAX) 70 MG tablet TAKE 1 TABLET BY MOUTH EVERY 7 DAYS. TAKE WITH A FULL GLASS OF WATER ON AN EMPTY STOMACH 12 tablet 0   ALPRAZolam (XANAX) 0.25 MG tablet TAKE 1 TABLET BY MOUTH 2 TIMES DAILY AS NEEDED FOR ANXIETY. 30 tablet 0   amLODipine (NORVASC) 5 MG tablet Take 1 tablet (5 mg total) by mouth daily. 90 tablet 3   aspirin EC 81 MG tablet Take 1 tablet (81 mg total) by mouth 2 (two) times daily. (Patient taking differently:  Take 81 mg by mouth daily.) 90 tablet 0   atorvastatin (LIPITOR) 20 MG tablet Take 1 tablet (20 mg total) by mouth daily. 90 tablet 3   losartan (COZAAR) 100 MG tablet Take 1 tablet (100 mg total) by mouth daily. 90 tablet 3   Melatonin 3 MG CAPS Take 3 mg by mouth at bedtime as needed (sleep).     metoprolol succinate (TOPROL-XL) 100 MG 24 hr tablet Take 1 tablet (100 mg total) by mouth daily. TAKE WITH OR IMMEDIATELY FOLLOWING A MEAL. 90 tablet 3   No current facility-administered medications on file prior to visit.    Allergies  Allergen Reactions   Prednisone Other (See Comments)    "like being in a coma"   Penicillins Itching, Rash and Other (See Comments)    "burning sensation" Has patient had a PCN reaction causing immediate rash, facial/tongue/throat swelling, SOB or lightheadedness with hypotension: No Has patient had a PCN reaction causing severe rash involving mucus membranes or skin necrosis: No Has patient had a PCN reaction that required hospitalization No Has patient had a PCN reaction occurring within the last 10 years: No If all of the above answers are "NO", then may proceed with Cephalosporin use.     DIAGNOSTIC DATA (LABS, IMAGING, TESTING) - I reviewed patient records, labs, notes, testing and imaging myself where available.  Lab Results  Component Value Date   WBC 4.4 12/26/2022   HGB 12.8 12/26/2022   HCT 39.1 12/26/2022   MCV 85.4 12/26/2022   PLT 126 (L) 12/26/2022      Component Value Date/Time   NA 144 12/26/2022 0848   NA 139 12/05/2020 0949   NA 141 12/12/2016 0813   K 3.9 12/26/2022 0848   K 4.0 12/12/2016 0813   CL 108 12/26/2022 0848   CO2 30 12/26/2022 0848   CO2 26 12/12/2016 0813   GLUCOSE 129 (H) 12/26/2022 0848   GLUCOSE 96 12/12/2016 0813   BUN 13 12/26/2022 0848   BUN 16 12/05/2020 0949   BUN 20.3 12/12/2016 0813  CREATININE 1.22 (H) 12/26/2022 0848   CREATININE 1.46 (H) 08/05/2017 1639   CREATININE 1.3 (H) 12/12/2016 0813    CALCIUM 8.9 12/26/2022 0848   CALCIUM 8.9 12/12/2016 0813   PROT 6.3 (L) 12/26/2022 0848   PROT 6.0 12/05/2020 0949   PROT 5.7 (L) 12/12/2016 0813   ALBUMIN 4.0 12/26/2022 0848   ALBUMIN 4.2 12/05/2020 0949   ALBUMIN 3.6 12/12/2016 0813   AST 22 12/26/2022 0848   AST 13 12/12/2016 0813   ALT 16 12/26/2022 0848   ALT 10 12/12/2016 0813   ALKPHOS 60 12/26/2022 0848   ALKPHOS 55 12/12/2016 0813   BILITOT 0.5 12/26/2022 0848   BILITOT 0.55 12/12/2016 0813   GFRNONAA 45 (L) 12/26/2022 0848   GFRAA 39 (L) 08/17/2020 0819   GFRAA 42 (L) 12/07/2019 0852   Lab Results  Component Value Date   CHOL 177 12/05/2020   HDL 56 12/05/2020   LDLCALC 90 12/05/2020   TRIG 183 (H) 12/05/2020   CHOLHDL 3.2 12/05/2020   Lab Results  Component Value Date   HGBA1C 5.4 01/14/2019   No results found for: "VITAMINB12" Lab Results  Component Value Date   TSH 2.730 04/05/2020    PHYSICAL EXAM:  Today's Vitals   01/28/23 1416  BP: (!) 142/76  Pulse: 74  Weight: 127 lb (57.6 kg)  Height: 5\' 2"  (1.575 m)   Body mass index is 23.23 kg/m.   Wt Readings from Last 3 Encounters:  01/28/23 127 lb (57.6 kg)  12/26/22 127 lb (57.6 kg)  10/17/22 125 lb 9.6 oz (57 kg)     Ht Readings from Last 3 Encounters:  01/28/23 5\' 2"  (1.575 m)  06/15/22 5\' 3"  (1.6 m)  06/13/22 5' 2.75" (1.594 m)      General: The patient is awake, alert and appears not in acute distress. The patient is well groomed. Head: Normocephalic, atraumatic. Neck is supple.  Mallampati 2,  neck circumference:13 inches . Nasal airflow  patent.  Retrognathia is not seen.  Dental status:  Cardiovascular:  Regular rate and cardiac rhythm by pulse,  without distended neck veins. Respiratory: Lungs are clear to auscultation.  Skin:  Without evidence of ankle edema,  Trunk: The patient's posture is erect.   NEUROLOGIC EXAM: The patient is awake and alert, oriented to place and time.   Memory subjective described as intact.   Attention span & concentration ability appears normal.  Speech is fluent,  without  dysarthria, dysphonia or aphasia.  Mood and affect are appropriate.   Cranial nerves: no loss of smell or taste reported  Pupils are equal and briskly reactive to light. Funduscopic exam deferred.  Extraocular movements in vertical and horizontal planes were intact and without nystagmus. No Diplopia. Visual fields by finger perimetry are intact. Hearing was intact to soft voice and finger rubbing.    Facial sensation intact to fine touch.  Facial motor strength is symmetric and tongue and uvula move midline.  Neck ROM : rotation, tilt and flexion extension were normal for age and shoulder shrug was symmetrical.    Motor exam:  Symmetric bulk, tone and ROM.   Normal tone without cog wheeling, symmetric grip strength .   Sensory:  Fine touch, pinprick and vibration were tested  and  normal.  Proprioception tested in the upper extremities was normal.   Coordination: Rapid alternating movements in the fingers/hands were of normal speed.  The Finger-to-nose maneuver was intact without evidence of ataxia, dysmetria or tremor.   Gait and  station: Patient could rise unassisted from a seated position, walked without assistive device.  Deep tendon reflexes: Left first toe extended (she says it is chronic due to prior surgery). Right toes downgoing.     ASSESSMENT AND PLAN 72 -year - old recently widowed  female  here with headaches :    1)  the character of her headaches is not migrainous and not clearly tension type.    2) I would like to to first address what I consider most likely to be a tension headache and that is her neck pain her cervicalgia.  And this pain can range between a 1 and 7 out of 10  make her feel uncomfortable when it comes to neck movement and head movements.   It is best to try and treat this overnight so that she wakes up in a relaxed state and hopefully with a higher threshold to  develop new headaches the next day. My goal is to start with amitriptyline or Elavil.   There are cautions in patients of her age against memory impairment that could be induced by this I am intending to use a very low-dose alternatively I would use Flexeril at a very very low dose.    Again nightly dose only.  As to #2 the retro-orbital pain the pressure in the tent in the temple and the pulsation this may be a vascular headache even if it is not migrainous.  Nausea is rarely felt visit there is no photophobia there is no phonophobia.  And for a vascular headache the best treatment is usually the strongest control of blood pressure and this can be achieved usually by a beta-blocker 1 of which she already takes.  She is on 3 drugs to treat her blood pressure currently but yet presented today with a blood pressure of 142/76 elevated. Calcium channel blocker would be indicated.  Verapamil 180 mg at night .     MRI brain will be ordered, MRA included.  This is needed because she reported a history of TIAS, or mini strokes.  RV in 2-3 months with me or NP. I will be a happy to involve one of my headache specialists in her care.     I would like to thank  Ronnald Nian, Md [address] [place],  [place] [address] for allowing me to meet with and to take care of this pleasant patient.    After spending a total time of  45  minutes face to face and additional time for physical and neurologic examination, review of laboratory studies,  personal review of imaging studies, reports and results of other testing and review of referral information / records as far as provided in visit,   Electronically signed by: Melvyn Novas, MD 01/28/2023 3:03 PM  [facility] Board certified by The [employer] of Sleep Medicine and Diplomate of the [employer] of Sleep Medicine. Board certified In Neurology through the ABPN, Fellow of the [employer] of  Neurology.

## 2023-01-28 NOTE — Patient Instructions (Addendum)
82 -year - old recently widowed  female  here with headaches :    1)  the character of her headaches is not migrainous and not clearly tension type.    2) I would like to to first address what I consider most likely to be a tension headache and that is her neck pain her cervicalgia.  And this pain can range between a 1 and 7 out of 10  make her feel uncomfortable when it comes to neck movement and head movements.   It is best to try and treat this overnight so that she wakes up in a relaxed state and hopefully with a higher threshold to develop new headaches the next day. My goal is to start with amitriptyline or Elavil.   There are cautions in patients of her age against memory impairment that could be induced by this I am intending to use a very low-dose alternatively I would use Flexeril at a very very low dose.    Again nightly dose only.  As to #2 the retro-orbital pain the pressure in the tent in the temple and the pulsation this may be a vascular headache even if it is not migrainous.  Nausea is rarely felt visit there is no photophobia there is no phonophobia.  And for a vascular headache the best treatment is usually the strongest control of blood pressure and this can be achieved usually by a beta-blocker 1 of which she already takes.  She is on 3 drugs to treat her blood pressure currently but yet presented today with a blood pressure of 142/76 elevated. Calcium channel blocker would be indicated.  Verapamil 180 mg at night .     MRI brain will be ordered, MRA included.  This is needed because she reported a history of TIAS, or mini strokes.  RV in 2-3 months with me or NP. I will be a happy to involve one of my headache specialists in her care.    I would like to thank  Ronnald Nian, MD [address] [place],  [place] [address] for allowing me to meet with and to take care of this pleasant patient.     Tension Headache, Adult A tension headache is a feeling of pain,  pressure, or aching over the front and sides of the head. The pain can be dull, or it can feel tight. There are two types of tension headache: Episodic tension headache. This is when the headaches happen fewer than 15 days a month. Chronic tension headache. This is when the headaches happen more than 15 days a month during a 87-month period. A tension headache can last from 30 minutes to several days. It is the most common kind of headache. Tension headaches are not normally associated with nausea or vomiting, and they do not get worse with physical activity. What are the causes? The exact cause of this condition is not known. Tension headaches are often triggered by stress, anxiety, or depression. Other triggers may include: Alcohol. Too much caffeine or caffeine withdrawal. Respiratory infections, such as colds, flu, or sinus infections. Dental problems or teeth clenching. Fatigue. Holding your head and neck in the same position for a long period of time, such as while using a computer. Smoking. Arthritis of the neck. What are the signs or symptoms? Symptoms of this condition include: A feeling of pressure or tightness around the head. Dull, aching head pain. Pain over the front and sides of the head. Tenderness in the muscles of the  head, neck, and shoulders. How is this diagnosed? This condition may be diagnosed based on your symptoms, your medical history, and a physical exam. If your symptoms are severe or unusual, you may have imaging tests, such as a CT scan or an MRI of your head. Your vision may also be checked. How is this treated? This condition may be treated with lifestyle changes and with medicines that help relieve symptoms. Follow these instructions at home: Managing pain Take over-the-counter and prescription medicines only as told by your health care provider. When you have a headache, lie down in a dark, quiet room. If directed, put ice on your head and neck. To do  this: Put ice in a plastic bag. Place a towel between your skin and the bag. Leave the ice on for 20 minutes, 2-3 times a day. Remove the ice if your skin turns bright red. This is very important. If you cannot feel pain, heat, or cold, you have a greater risk of damage to the area. If directed, apply heat to the back of your neck as often as told by your health care provider. Use the heat source that your health care provider recommends, such as a moist heat pack or a heating pad. Place a towel between your skin and the heat source. Leave the heat on for 20-30 minutes. Remove the heat if your skin turns bright red. This is especially important if you are unable to feel pain, heat, or cold. You have a greater risk of getting burned. Eating and drinking Eat meals on a regular schedule. If you drink alcohol: Limit how much you have to: 0-1 drink a day for women who are not pregnant. 0-2 drinks a day for men. Know how much alcohol is in your drink. In the [place] one drink equals one 12 oz bottle of beer (355 mL), one 5 oz glass of wine (148 mL), or one 1 oz glass of hard liquor (44 mL). Drink enough fluid to keep your urine pale yellow. Decrease your caffeine intake, or stop using caffeine. Lifestyle Get 7-9 hours of sleep each night, or get the amount of sleep recommended by your health care provider. At bedtime, remove computers, phones, and tablets from your room. Find ways to manage your stress. This may include: Exercise. Deep breathing exercises. Yoga. Listening to music. Positive mental imagery. Try to sit up straight and avoid tensing your muscles. Do not use any products that contain nicotine or tobacco. These include cigarettes, chewing tobacco, and vaping devices, such as e-cigarettes. If you need help quitting, ask your health care provider. General instructions  Avoid any headache triggers. Keep a journal to help find out what may trigger your headaches. For example, write  down: What you eat and drink. How much sleep you get. Any change to your diet or medicines. Keep all follow-up visits. This is important. Contact a health care provider if: Your headache does not get better. Your headache comes back. You are sensitive to sounds, light, or smells because of a headache. You have nausea or you vomit. Your stomach hurts. Get help right away if: You suddenly develop a severe headache, along with any of the following: A stiff neck. Nausea and vomiting. Confusion. Weakness in one part or one side of your body. Double vision or loss of vision. Shortness of breath. Rash. Unusual sleepiness. Fever or chills. Trouble speaking. Pain in your eye or ear. Trouble walking or balancing. Feeling faint or passing out. Summary A tension headache is  a feeling of pain, pressure, or aching over the front and sides of the head. A tension headache can last from 30 minutes to several days. It is the most common kind of headache. This condition may be diagnosed based on your symptoms, your medical history, and a physical exam. This condition may be treated with lifestyle changes and with medicines that help relieve symptoms. This information is not intended to replace advice given to you by your health care provider. Make sure you discuss any questions you have with your health care provider. Document Revised: 04/14/2020 Document Reviewed: 04/14/2020 Elsevier Patient Education  2024 [employer].

## 2023-01-30 ENCOUNTER — Telehealth: Payer: Self-pay | Admitting: Neurology

## 2023-01-30 NOTE — Telephone Encounter (Signed)
Medicare NPR sent to GI [phone]

## 2023-02-04 ENCOUNTER — Telehealth: Payer: Self-pay | Admitting: Neurology

## 2023-02-04 NOTE — Telephone Encounter (Signed)
At 12:11 pt left vm stating the 2 recently prescribed medications that she is to take at night are causing her to be out of focus, she is unable to stay awake, unable to walk.  Pt stated she is non functional, please call pt to discuss.

## 2023-02-04 NOTE — Telephone Encounter (Signed)
Call to patient where she reports that she took two nights of the elavil and verapamil and was tired/groggy for three days and had to stay in bed. Patient reports that this is very unlike her and she is sensitive to medications and doesn't know if a dosage change would help her problems but would like advice because as of now she is not taking the medication anymore.

## 2023-02-05 NOTE — Telephone Encounter (Signed)
Lets take only the verapamil, leave the elavil, for 7 nights. CD

## 2023-02-05 NOTE — Telephone Encounter (Signed)
I called patient. I discussed this with her. She will only take verapamil for the next week and hold the elavil. Will reconsider adding on elavil again next week and keep [place] updated.

## 2023-02-07 ENCOUNTER — Telehealth: Payer: Self-pay | Admitting: Family Medicine

## 2023-02-07 NOTE — Telephone Encounter (Signed)
Immunization report reviewed with patient. She has had both doses of Shingles vaccine per chart.

## 2023-02-07 NOTE — Telephone Encounter (Signed)
Rayshell called and states [facility] called and said it was time for her to get her 2nd shingles shot but she believes she has already had it. Are you able to see if she has had both shingle shots?

## 2023-02-20 ENCOUNTER — Encounter: Payer: Self-pay | Admitting: Family Medicine

## 2023-02-20 ENCOUNTER — Other Ambulatory Visit: Payer: Self-pay | Admitting: Neurology

## 2023-02-20 ENCOUNTER — Ambulatory Visit (INDEPENDENT_AMBULATORY_CARE_PROVIDER_SITE_OTHER): Payer: Medicare Other | Admitting: Family Medicine

## 2023-02-20 VITALS — BP 138/78 | HR 60 | Temp 97.8°F | Resp 16 | Wt 130.2 lb

## 2023-02-20 DIAGNOSIS — S51812A Laceration without foreign body of left forearm, initial encounter: Secondary | ICD-10-CM | POA: Diagnosis not present

## 2023-02-20 DIAGNOSIS — M238X1 Other internal derangements of right knee: Secondary | ICD-10-CM

## 2023-02-20 NOTE — Progress Notes (Signed)
   Subjective:    Patient ID: Tonya Wise, female    DOB: Dec 27, 1940, 82 y.o.   MRN: [id number]  HPI Recently she noted a knot on her right knee that she has questions about.  No history of injury.  It causes no pain or discomfort.  Also she recently fell sustaining a laceration to her left forearm.  She took care of it herself but still does have some slight ridging from the way the laceration healed.   Review of Systems     Objective:    Physical Exam Exam of the right knee does show a cystic movable nontender lesion over the inferior pole of the patella.  The patella and patellar tendon are normal.  Exam of the left forearm shows a healed lesion with ridging where there was some slight overlapping of the tissue.       Assessment & Plan:   Forearm laceration, left, initial encounter  Intraligamentous cyst of knee, right I reassured her that the forearm laceration has healed nicely with no evidence of any other damage.  The cystic lesion on her patellar tendon is nothing of any major concern.

## 2023-02-22 ENCOUNTER — Telehealth: Payer: Self-pay | Admitting: Neurology

## 2023-02-22 NOTE — Telephone Encounter (Signed)
This patient has suffered a fall with a forearm laceration I noted ( 02-20-2023) .  If there is a concern that amitriptyline ( her headache prevention medication) may have promoted this fall.  If yes, I will rec to immediately d/c this medication.   Melvyn Novas, MD

## 2023-03-08 ENCOUNTER — Ambulatory Visit
Admission: RE | Admit: 2023-03-08 | Discharge: 2023-03-08 | Disposition: A | Discharge status: Home / Self Care | Payer: Medicare Other | Source: Ambulatory Visit | Attending: Neurology | Admitting: Neurology

## 2023-03-08 DIAGNOSIS — M542 Cervicalgia: Secondary | ICD-10-CM

## 2023-03-08 DIAGNOSIS — G441 Vascular headache, not elsewhere classified: Secondary | ICD-10-CM

## 2023-03-08 DIAGNOSIS — I6782 Cerebral ischemia: Secondary | ICD-10-CM

## 2023-03-08 MED ORDER — GADOPICLENOL 0.5 MMOL/ML IV SOLN
6.0000 mL | Freq: Once | INTRAVENOUS | Status: AC | PRN
  Administered 2023-03-08: 6 mL via INTRAVENOUS

## 2023-03-12 ENCOUNTER — Telehealth: Payer: Self-pay | Admitting: Neurology

## 2023-03-12 NOTE — Telephone Encounter (Signed)
-----   Message from Tonya Wise sent at 03/11/2023  1:06 PM EDT ----- Please let her know that the MRI of the blood vessels look good.  The MRI of the brain does show a small old stroke deep in the brain but otherwise it looks about the same as it did in 2015.  She is already on aspirin and should continue.

## 2023-03-12 NOTE — Telephone Encounter (Signed)
Called the patient and reviewed the MRI/MRA brain results. Advised of the no changes or concerns. Pt verbalized understanding. Pt had no questions at this time but was encouraged to call back if questions arise.

## 2023-03-20 ENCOUNTER — Ambulatory Visit: Payer: Medicare Other | Admitting: Family Medicine

## 2023-03-28 ENCOUNTER — Ambulatory Visit: Payer: Medicare Other | Admitting: Family Medicine

## 2023-04-06 ENCOUNTER — Other Ambulatory Visit: Payer: Self-pay | Admitting: Family Medicine

## 2023-04-06 DIAGNOSIS — M810 Age-related osteoporosis without current pathological fracture: Secondary | ICD-10-CM

## 2023-04-10 ENCOUNTER — Other Ambulatory Visit: Payer: Self-pay | Admitting: Family Medicine

## 2023-04-10 DIAGNOSIS — M810 Age-related osteoporosis without current pathological fracture: Secondary | ICD-10-CM

## 2023-05-07 ENCOUNTER — Other Ambulatory Visit: Payer: Self-pay | Admitting: Adult Health

## 2023-05-17 DIAGNOSIS — L570 Actinic keratosis: Secondary | ICD-10-CM | POA: Diagnosis not present

## 2023-05-17 DIAGNOSIS — C44722 Squamous cell carcinoma of skin of right lower limb, including hip: Secondary | ICD-10-CM | POA: Diagnosis not present

## 2023-05-17 DIAGNOSIS — L578 Other skin changes due to chronic exposure to nonionizing radiation: Secondary | ICD-10-CM | POA: Diagnosis not present

## 2023-05-17 DIAGNOSIS — Z85828 Personal history of other malignant neoplasm of skin: Secondary | ICD-10-CM | POA: Diagnosis not present

## 2023-05-17 HISTORY — PX: SKIN BIOPSY: SHX1

## 2023-05-20 NOTE — Progress Notes (Unsigned)
[facility] [address] [place]. [place] [address] [phone]       OFFICE FOLLOW UP NOTE  [name] Date of Birth:  1941/07/27 Medical Record Number:  [id number]    Primary neurologist: Dr. Vickey Wise Reason for visit: Headaches    SUBJECTIVE:  CHIEF COMPLAINT:  No chief complaint on file.   Follow-up visit:  Prior visit: 01/28/2023 with Dr. Vickey Wise (initial consult visit)  Brief HPI:   Tonya Wise is a 82 y.o. female with PMH of TIA, anxiety, non-Hodgkin's lymphoma s/p chemo 2015, anemia of chronic disease, HTN, cardiomyopathy, chronic systolic and diastolic heart failure and hx of PE who was evaluated by Dr. Vickey Wise on 01/28/2023 for evaluation of chronic headaches (onset 20+ years ago).  Previously seen by Dr. Everlena Wise in 2015, reported evolution of headaches since then being more localized and bitemporal and retro-orbital with sharp pain type quality followed by dull ache, no photophobia or phonophobia.   At prior visit, suspected more tension related headaches possibly related to cervicalgia and recommended initiation of amitriptyline 25mg  nightly and verapamil 180mg  nightly.  Also recommended completion of MRI brain and MRA.   Interval history:     MRI brain showed remote small left thalamic lacunar infarct and mild age-appropriate chronic small vessel disease and generalized cerebral atrophy, no acute abnormalities noted. MRA head no significant abnormalities     ROS:   14 system review of systems performed and negative with exception of ***  PMH:  Past Medical History:  Diagnosis Date   Allergy    Anemia    Ankle fracture, left    Chemotherapy induced cardiomyopathy (HCC)    a. 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   Stroke (HCC) 2015   per pt while she was in hospital for Nonhodgkins Lymphoma    PSH:  Past Surgical History:  Procedure Laterality Date   ABDOMINAL HYSTERECTOMY     ANTERIOR LAT LUMBAR FUSION N/A 06/28/2020   Procedure: Lumbar two-three Anterolateral lumbar interbody fusion with lateral plate;  Surgeon: Barnett Abu, MD;  Location: [facility] OR;  Service: Neurosurgery;  Laterality: N/A;   APPENDECTOMY     BACK SURGERY     LOWER BACK TWICE   COLONOSCOPY  07/30/2006   ESOPHAGOGASTRODUODENOSCOPY N/A 03/29/2013   Procedure: ESOPHAGOGASTRODUODENOSCOPY (EGD);  Surgeon: Charna Elizabeth, MD;  Location: [facility] ENDOSCOPY;  Service: Endoscopy;  Laterality: N/A;   INGUINAL LYMPH NODE BIOPSY Right 03/31/2013   Procedure: INGUINAL LYMPH NODE BIOPSY;  Surgeon: Cherylynn Ridges, MD;  Location: [facility] OR;  Service: General;  Laterality: Right;   ORIF ANKLE FRACTURE Left 11/03/2015   Procedure: OPEN REDUCTION INTERNAL FIXATION (ORIF) LEFT ANKLE FRACTURE;  Surgeon: Toni Arthurs, MD;  Location: [facility];  Service: Orthopedics;  Laterality: Left;   SKIN BIOPSY Left 02/27/2022   well differenited squamous cell carcinoma   SPINE SURGERY      Social History:  Social History   Socioeconomic History   Marital status: Married    Spouse name: Tonya Wise   Number of children: 2   Years of education: 12   Highest education level: Not on  file  Occupational History    Employer: [employer]  Tobacco Use   Smoking status: Never   Smokeless tobacco: Never  Substance and Sexual Activity   Alcohol use: No   Drug use: No   Sexual activity: Yes    Partners: Male  Other Topics Concern   Not on file  Social History Narrative   Married.  Lives in [place] with husband.     Social Determinants of Health   Financial Resource Strain: Low Risk  (06/01/2021)   Overall Financial Resource Strain  (CARDIA)    Difficulty of Paying Living Expenses: Not hard at all  Food Insecurity: Not on file  Transportation Needs: No Transportation Needs (06/01/2021)   PRAPARE - Administrator, Civil Service (Medical): No    Lack of Transportation (Non-Medical): No  Physical Activity: Not on file  Stress: Not on file  Social Connections: Not on file  Intimate Partner Violence: Not on file    Family History:  Family History  Problem Relation Age of Onset   Heart attack Father 79   Leukemia Mother    Diabetes Brother    Diabetes Sister     Medications:   Current Outpatient Medications on File Prior to Visit  Medication Sig Dispense Refill   alendronate (FOSAMAX) 70 MG tablet TAKE 1 TABLET BY MOUTH EVERY 7 DAYS. TAKE WITH A FULL GLASS OF WATER ON AN EMPTY STOMACH 12 tablet 0   ALPRAZolam (XANAX) 0.25 MG tablet TAKE 1 TABLET BY MOUTH 2 TIMES DAILY AS NEEDED FOR ANXIETY. 30 tablet 0   amitriptyline (ELAVIL) 25 MG tablet TAKE 1 TABLET BY MOUTH EVERYDAY AT BEDTIME 90 tablet 2   amLODipine (NORVASC) 5 MG tablet Take 1 tablet (5 mg total) by mouth daily. 90 tablet 3   aspirin EC 81 MG tablet Take 1 tablet (81 mg total) by mouth 2 (two) times daily. (Patient taking differently: Take 81 mg by mouth daily.) 90 tablet 0   atorvastatin (LIPITOR) 20 MG tablet Take 1 tablet (20 mg total) by mouth daily. 90 tablet 3   losartan (COZAAR) 100 MG tablet Take 1 tablet (100 mg total) by mouth daily. 90 tablet 3   Melatonin 3 MG CAPS Take 3 mg by mouth at bedtime as needed (sleep).     metoprolol succinate (TOPROL-XL) 100 MG 24 hr tablet Take 1 tablet (100 mg total) by mouth daily. TAKE WITH OR IMMEDIATELY FOLLOWING A MEAL. Please call [phone] to schedule an appointment for future refills. Thank you. 30 tablet 0   verapamil (CALAN-SR) 120 MG CR tablet TAKE 1 TABLET BY MOUTH AT BEDTIME. 90 tablet 1   No current facility-administered medications on file prior to visit.    Allergies:   Allergies   Allergen Reactions   Prednisone Other (See Comments)    "like being in a coma"   Penicillins Itching, Rash and Other (See Comments)    "burning sensation" Has patient had a PCN reaction causing immediate rash, facial/tongue/throat swelling, SOB or lightheadedness with hypotension: No Has patient had a PCN reaction causing severe rash involving mucus membranes or skin necrosis: No Has patient had a PCN reaction that required hospitalization No Has patient had a PCN reaction occurring within the last 10 years: No If all of the above answers are "NO", then may proceed with Cephalosporin use.      OBJECTIVE:  Physical Exam  There were no vitals filed for this visit. There is no height or weight on file to  calculate BMI. No results found.   General: well developed, well nourished, seated, in no evident distress Head: head normocephalic and atraumatic.   Neck: supple with no carotid or supraclavicular bruits Cardiovascular: regular rate and rhythm, no murmurs Musculoskeletal: no deformity Skin:  no rash/petichiae Vascular:  Normal pulses all extremities   Neurologic Exam Mental Status: Awake and fully alert. Oriented to place and time. Recent and remote memory intact. Attention span, concentration and fund of knowledge appropriate. Mood and affect appropriate.  Cranial Nerves: Pupils equal, briskly reactive to light. Extraocular movements full without nystagmus. Visual fields full to confrontation. Hearing intact. Facial sensation intact. Face, tongue, palate moves normally and symmetrically.  Motor: Normal bulk and tone. Normal strength in all tested extremity muscles Sensory.: intact to touch , pinprick , position and vibratory sensation.  Coordination: Rapid alternating movements normal in all extremities. Finger-to-nose and heel-to-shin performed accurately bilaterally. Gait and Station: Arises from chair without difficulty. Stance is normal. Gait demonstrates normal stride  length and balance with ***. Tandem walk and heel toe ***.  Reflexes: 1+ and symmetric. Toes downgoing.         ASSESSMENT/PLAN: Tonya Wise is a 82 y.o. year old female      Chronic headaches, tension type:  Continue amitriptyline 25 mg nightly Continue verapamil 180 mg nightly MRI brain 02/2023 chronic left thalamic stroke, no findings contributing to headaches MRA head 02/2023 unremarkable     Follow up in *** or call earlier if needed   CC:  PCP: Ronnald Nian, MD    I spent *** minutes of face-to-face and non-face-to-face time with patient.  This included previsit chart review, lab review, study review, order entry, electronic health record documentation, patient education and discussion regarding above diagnoses and treatment plan and answered all other questions to patient's satisfaction    Ihor Austin, [facility]  [facility] [address] [place], [place] [address]  Phone [phone] Fax [phone] Note: This document was prepared with digital dictation and possible smart phrase technology. Any transcriptional errors that result from this process are unintentional.

## 2023-05-21 ENCOUNTER — Ambulatory Visit (INDEPENDENT_AMBULATORY_CARE_PROVIDER_SITE_OTHER): Payer: Medicare Other | Admitting: Adult Health

## 2023-05-21 ENCOUNTER — Encounter: Payer: Self-pay | Admitting: Adult Health

## 2023-05-21 ENCOUNTER — Other Ambulatory Visit (INDEPENDENT_AMBULATORY_CARE_PROVIDER_SITE_OTHER): Payer: Medicare Other

## 2023-05-21 VITALS — BP 152/92 | HR 64 | Ht 63.0 in | Wt 133.6 lb

## 2023-05-21 DIAGNOSIS — G44221 Chronic tension-type headache, intractable: Secondary | ICD-10-CM

## 2023-05-21 DIAGNOSIS — G43709 Chronic migraine without aura, not intractable, without status migrainosus: Secondary | ICD-10-CM | POA: Diagnosis not present

## 2023-05-21 DIAGNOSIS — M542 Cervicalgia: Secondary | ICD-10-CM | POA: Diagnosis not present

## 2023-05-21 DIAGNOSIS — Z23 Encounter for immunization: Secondary | ICD-10-CM | POA: Diagnosis not present

## 2023-05-21 MED ORDER — VERAPAMIL HCL ER 180 MG PO TBCR
180.0000 mg | EXTENDED_RELEASE_TABLET | Freq: Every day | ORAL | 11 refills | Status: DC
Start: 2023-05-21 — End: 2023-05-30

## 2023-05-21 MED ORDER — NURTEC 75 MG PO TBDP
75.0000 mg | ORAL_TABLET | ORAL | 11 refills | Status: DC | PRN
Start: 2023-05-21 — End: 2023-12-04

## 2023-05-21 NOTE — Patient Instructions (Addendum)
Your Plan:  Increasing verapamil 240mg  nightly for continued headaches  Try Nurtec 75mg  daily as needed for onset of headaches - please let me know if this has no benefit and we can try Ubrelvy   Referral will be placed to PT to be evaluated for dry needling for night tightness/tension possibly contributing to headaches    Follow up in 6 months or call earlier if needed     Thank you for coming to see [place] at [facility]. I hope we have been able to provide you high quality care today.  You may receive a patient satisfaction survey over the next few weeks. We would appreciate your feedback and comments so that we may continue to improve ourselves and the health of our patients.     Tension Headache, Adult A tension headache is a feeling of pain, pressure, or aching over the front and sides of the head. The pain can be dull, or it can feel tight. There are two types of tension headache: Episodic tension headache. This is when the headaches happen fewer than 15 days a month. Chronic tension headache. This is when the headaches happen more than 15 days a month during a 1-month period. A tension headache can last from 30 minutes to several days. It is the most common kind of headache. Tension headaches are not normally associated with nausea or vomiting, and they do not get worse with physical activity. What are the causes? The exact cause of this condition is not known. Tension headaches are often triggered by stress, anxiety, or depression. Other triggers may include: Alcohol. Too much caffeine or caffeine withdrawal. Respiratory infections, such as colds, flu, or sinus infections. Dental problems or teeth clenching. Fatigue. Holding your head and neck in the same position for a long period of time, such as while using a computer. Smoking. Arthritis of the neck. What are the signs or symptoms? Symptoms of this condition include: A feeling of pressure or tightness  around the head. Dull, aching head pain. Pain over the front and sides of the head. Tenderness in the muscles of the head, neck, and shoulders. How is this diagnosed? This condition may be diagnosed based on your symptoms, your medical history, and a physical exam. If your symptoms are severe or unusual, you may have imaging tests, such as a CT scan or an MRI of your head. Your vision may also be checked. How is this treated? This condition may be treated with lifestyle changes and with medicines that help relieve symptoms. Follow these instructions at home: Managing pain Take over-the-counter and prescription medicines only as told by your health care provider. When you have a headache, lie down in a dark, quiet room. If directed, put ice on your head and neck. To do this: Put ice in a plastic bag. Place a towel between your skin and the bag. Leave the ice on for 20 minutes, 2-3 times a day. Remove the ice if your skin turns bright red. This is very important. If you cannot feel pain, heat, or cold, you have a greater risk of damage to the area. If directed, apply heat to the back of your neck as often as told by your health care provider. Use the heat source that your health care provider recommends, such as a moist heat pack or a heating pad. Place a towel between your skin and the heat source. Leave the heat on for 20-30 minutes. Remove the heat if your skin turns bright red.  This is especially important if you are unable to feel pain, heat, or cold. You have a greater risk of getting burned. Eating and drinking Eat meals on a regular schedule. If you drink alcohol: Limit how much you have to: 0-1 drink a day for women who are not pregnant. 0-2 drinks a day for men. Know how much alcohol is in your drink. In the [place] one drink equals one 12 oz bottle of beer (355 mL), one 5 oz glass of wine (148 mL), or one 1 oz glass of hard liquor (44 mL). Drink enough fluid to keep your urine  pale yellow. Decrease your caffeine intake, or stop using caffeine. Lifestyle Get 7-9 hours of sleep each night, or get the amount of sleep recommended by your health care provider. At bedtime, remove computers, phones, and tablets from your room. Find ways to manage your stress. This may include: Exercise. Deep breathing exercises. Yoga. Listening to music. Positive mental imagery. Try to sit up straight and avoid tensing your muscles. Do not use any products that contain nicotine or tobacco. These include cigarettes, chewing tobacco, and vaping devices, such as e-cigarettes. If you need help quitting, ask your health care provider. General instructions  Avoid any headache triggers. Keep a journal to help find out what may trigger your headaches. For example, write down: What you eat and drink. How much sleep you get. Any change to your diet or medicines. Keep all follow-up visits. This is important. Contact a health care provider if: Your headache does not get better. Your headache comes back. You are sensitive to sounds, light, or smells because of a headache. You have nausea or you vomit. Your stomach hurts. Get help right away if: You suddenly develop a severe headache, along with any of the following: A stiff neck. Nausea and vomiting. Confusion. Weakness in one part or one side of your body. Double vision or loss of vision. Shortness of breath. Rash. Unusual sleepiness. Fever or chills. Trouble speaking. Pain in your eye or ear. Trouble walking or balancing. Feeling faint or passing out. Summary A tension headache is a feeling of pain, pressure, or aching over the front and sides of the head. A tension headache can last from 30 minutes to several days. It is the most common kind of headache. This condition may be diagnosed based on your symptoms, your medical history, and a physical exam. This condition may be treated with lifestyle changes and with medicines that  help relieve symptoms. This information is not intended to replace advice given to you by your health care provider. Make sure you discuss any questions you have with your health care provider. Document Revised: 04/14/2020 Document Reviewed: 04/14/2020 Elsevier Patient Education  2024 [employer].

## 2023-05-24 ENCOUNTER — Encounter: Payer: Self-pay | Admitting: Family Medicine

## 2023-05-24 ENCOUNTER — Telehealth: Payer: Self-pay | Admitting: Internal Medicine

## 2023-05-24 NOTE — Telephone Encounter (Signed)
Rescheduled 11/27 appointment due to provider pal, patient has been called and voicemail was left regarding appointment change.

## 2023-05-29 ENCOUNTER — Telehealth: Payer: Self-pay | Admitting: Adult Health

## 2023-05-29 NOTE — Telephone Encounter (Signed)
When was the last time she took the 180mg  tablet? If she is experiencing any radiating pain, shortness of breath, diaphoresis, palpitations or dizziness, she should proceed to the ED for emergent evaluation. Would advise to stop the 180mg  tablet (if not already) and return back to 120mg  tablet.

## 2023-05-29 NOTE — Telephone Encounter (Signed)
Pt said not taking the medication, verapamil (CALAN-SR) 180 MG CR tablet because cause extreme chest pain, Do not have a reaction when taking Verapamil 120mg  was prescribed by in past by Dr. Vickey Huger. Would like a call from the nurse.

## 2023-05-30 ENCOUNTER — Other Ambulatory Visit: Payer: Self-pay | Admitting: Neurology

## 2023-05-30 MED ORDER — VERAPAMIL HCL ER 120 MG PO TBCR: 120.0000 mg | EXTENDED_RELEASE_TABLET | Freq: Every day | ORAL | 5 refills | Status: DC

## 2023-05-30 NOTE — Telephone Encounter (Signed)
Called the patient back. She took the 180 mg one time. She took that on 10/24 that night and stated that 20-30 min later she had horrible chest pain and it scared her to the point she thought she was having a heart attack pt states that she didn't want to try again because of that one experience. She had 120 mg on hand and has been taking that since with no problems. She states that she prefers to go back to the 120 mg dose daily. She did states that she is not actively having any of the chest pains or side effects.

## 2023-06-04 ENCOUNTER — Other Ambulatory Visit (HOSPITAL_COMMUNITY): Payer: Self-pay

## 2023-06-04 ENCOUNTER — Telehealth: Payer: Self-pay

## 2023-06-04 NOTE — Telephone Encounter (Signed)
Pharmacy Patient Advocate Encounter   Received notification from CoverMyMeds that prior authorization for Nurtec 75MG  dispersible tablets is required/requested.   Insurance verification completed.   The patient is insured through [facility] .   Per test claim: PA required; PA submitted to above mentioned insurance via CoverMyMeds Key/confirmation #/EOC [id number] Status is pending

## 2023-06-04 NOTE — Telephone Encounter (Signed)
Pharmacy Patient Advocate Encounter  Received notification from [facility] that Prior Authorization for Nurtec 75MG  dispersible tablets has been APPROVED from 06/04/2023 to 07/29/2098. Ran test claim, Copay is $583.13. This test claim was processed through [facility]- copay amounts may vary at other pharmacies due to pharmacy/plan contracts, or as the patient moves through the different stages of their insurance plan.   PA #/Case ID/Reference #: PA Case ID #: [id number]

## 2023-06-17 ENCOUNTER — Ambulatory Visit: Payer: Medicare Other | Attending: Adult Health | Admitting: Physical Therapy

## 2023-06-17 ENCOUNTER — Other Ambulatory Visit: Payer: Self-pay

## 2023-06-17 DIAGNOSIS — G8929 Other chronic pain: Secondary | ICD-10-CM | POA: Diagnosis not present

## 2023-06-17 DIAGNOSIS — M542 Cervicalgia: Secondary | ICD-10-CM | POA: Insufficient documentation

## 2023-06-17 DIAGNOSIS — R293 Abnormal posture: Secondary | ICD-10-CM | POA: Diagnosis not present

## 2023-06-17 DIAGNOSIS — G43709 Chronic migraine without aura, not intractable, without status migrainosus: Secondary | ICD-10-CM | POA: Diagnosis not present

## 2023-06-17 DIAGNOSIS — G44221 Chronic tension-type headache, intractable: Secondary | ICD-10-CM | POA: Insufficient documentation

## 2023-06-17 DIAGNOSIS — R252 Cramp and spasm: Secondary | ICD-10-CM | POA: Insufficient documentation

## 2023-06-17 DIAGNOSIS — M6281 Muscle weakness (generalized): Secondary | ICD-10-CM | POA: Insufficient documentation

## 2023-06-17 DIAGNOSIS — R519 Headache, unspecified: Secondary | ICD-10-CM | POA: Diagnosis not present

## 2023-06-17 NOTE — Therapy (Signed)
OUTPATIENT PHYSICAL THERAPY CERVICAL EVALUATION   Patient Name: Tonya Wise MRN: [id number] DOB:[birth year], 82 y.o., female Today's Date: 06/17/2023  END OF SESSION:  PT End of Session - 06/17/23 0744     Visit Number 1    Number of Visits 7    Date for PT Re-Evaluation 07/29/23    Authorization Type Medicare    PT Start Time 0800    PT Stop Time 0840    PT Time Calculation (min) 40 min    Activity Tolerance Patient tolerated treatment well    Behavior During Therapy [facility] for tasks assessed/performed             Past Medical History:  Diagnosis Date   Allergy    Anemia    Ankle fracture, left    Chemotherapy induced cardiomyopathy (HCC)    a. 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   Stroke (HCC) 2015   per pt while she was in hospital for Nonhodgkins Lymphoma   Past Surgical History:  Procedure Laterality Date   ABDOMINAL HYSTERECTOMY     ANTERIOR LAT LUMBAR FUSION N/A 06/28/2020   Procedure: Lumbar two-three Anterolateral lumbar interbody fusion with lateral plate;  Surgeon: Barnett Abu, MD;  Location: [facility] OR;  Service: Neurosurgery;  Laterality: N/A;   APPENDECTOMY     BACK SURGERY     LOWER BACK TWICE   COLONOSCOPY  07/30/2006   ESOPHAGOGASTRODUODENOSCOPY N/A 03/29/2013   Procedure: ESOPHAGOGASTRODUODENOSCOPY (EGD);  Surgeon: Charna Elizabeth, MD;  Location: [facility] ENDOSCOPY;  Service: Endoscopy;  Laterality: N/A;   INGUINAL LYMPH NODE BIOPSY Right 03/31/2013   Procedure: INGUINAL LYMPH NODE BIOPSY;  Surgeon: Cherylynn Ridges, MD;  Location: [facility] OR;  Service: General;  Laterality: Right;   ORIF ANKLE FRACTURE Left 11/03/2015   Procedure: OPEN REDUCTION  INTERNAL FIXATION (ORIF) LEFT ANKLE FRACTURE;  Surgeon: Toni Arthurs, MD;  Location: [facility];  Service: Orthopedics;  Laterality: Left;   SKIN BIOPSY Left 02/27/2022   well differenited squamous cell carcinoma   SKIN BIOPSY Right 05/17/2023   Squamous cell carcinoma, keratoacanthoma type   SPINE SURGERY     Patient Active Problem List   Diagnosis Date Noted   Cervicalgia of occipito-atlanto-axial region 01/28/2023   Cephalalgia 01/28/2023   Non-Hodgkin lymphoma in remission 01/28/2023   Cerebral ischemia 01/28/2023   Temporal headache 01/28/2023   Hypertensive retinopathy 01/02/2023   Squamous cell carcinoma of thigh, 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   Aortic atherosclerosis (HCC) 03/13/2021   Lumbar stenosis with neurogenic claudication 06/28/2020   Stage 3b chronic kidney disease (HCC) 03/09/2020   Glucose intolerance 03/09/2020   Gastroesophageal reflux disease 01/14/2019   Chemotherapy induced cardiomyopathy (HCC) 04/04/2018   Age-related osteoporosis without current pathological fracture 02/18/2017   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   Essential hypertension 09/17/2014   Hyperlipidemia 09/17/2014   Thrombocytopenia (HCC) 08/23/2013   Chronic combined systolic and diastolic heart failure (HCC) 04/28/2013   Arthritis 06/02/2012    PCP: Ronnald Nian, MD  REFERRING PROVIDER: Ihor Austin, NP  REFERRING DIAG: M54.2 (ICD-10-CM) - Cervicalgia [phone] (ICD-10-CM) - Chronic tension-type headache, intractable G43.709 (ICD-10-CM) - Chronic migraine without aura  without status migrainosus, not intractable  THERAPY DIAG:  Cervicalgia  Muscle weakness (generalized)  Abnormal posture  Chronic intractable headache, unspecified headache type  Cramp and spasm  Rationale for Evaluation and Treatment: Rehabilitation  ONSET DATE: Chronic >20 years  SUBJECTIVE:                                                                                                                                                                                                          SUBJECTIVE STATEMENT: Pt states she has been having headaches and has been treated with medication. Pt states it helps her sleep at night but doesn't take it all the way away. Pt reports she's had headaches for many years. Pt states her husband has passed 1.5 years ago -- has been more stressed. "If I'm physically active it takes my mind off of it." Hand dominance: Right  PERTINENT HISTORY:  PMH of TIA, anxiety, non-Hodgkin's lymphoma s/p chemo 2015, anemia of chronic disease, HTN, cardiomyopathy, chronic systolic and diastolic heart failure and hx of PE who was evaluated by Dr. Vickey Huger on 01/28/2023 for evaluation of chronic headaches (onset 20+ years ago)  PAIN:  Are you having pain? Yes: NPRS scale: currently 6-7; at worst 8-9/10 Pain location: "behind my eyes" and "in my temples" Pain description: Dull ache Aggravating factors: stress Relieving factors: medication, "just keep on moving"  PRECAUTIONS: None  RED FLAGS: None     WEIGHT BEARING RESTRICTIONS: No  FALLS:  Has patient fallen in last 6 months? No  LIVING ENVIRONMENT: Lives with: lives alone Lives in: House/apartment  OCCUPATION: Retired - likes yard work  PLOF: Independent  PATIENT GOALS: Decrease headache  NEXT MD VISIT: 06/24/23 Medicare wellness check  OBJECTIVE:  Note: Objective measures were completed at Evaluation unless otherwise noted.  DIAGNOSTIC FINDINGS:  MRI brain showed remote small left thalamic lacunar infarct and mild age-appropriate chronic small vessel disease and generalized cerebral atrophy, no acute abnormalities noted. MRA head no significant abnormalities  PATIENT SURVEYS:  Neck Disability Index score: 13 / 50 = 26.0 %  COGNITION: Overall cognitive status: Within functional limits for tasks assessed  SENSATION: WFL  POSTURE: rounded shoulders and forward head  PALPATION: Trigger points most  notable in upper trap, suboccipital and levator scap. Some in cervical paraspinals   CERVICAL ROM:   Active ROM A/PROM (deg) eval  Flexion 35  Extension 45  Right lateral flexion 23  Left lateral flexion 23  Right rotation 40  Left rotation 35   (Blank rows = not tested)  UPPER EXTREMITY ROM:  Active ROM Right eval Left eval  Shoulder flexion    Shoulder extension    Shoulder abduction    Shoulder adduction    Shoulder extension    Shoulder internal rotation    Shoulder external rotation    Elbow flexion    Elbow extension    Wrist flexion    Wrist extension    Wrist ulnar deviation    Wrist radial deviation    Wrist pronation    Wrist supination     (Blank rows = not tested)  UPPER EXTREMITY MMT:  MMT Right eval Left eval  Shoulder flexion 3+ 3+  Shoulder extension 4 4  Shoulder abduction 3+ 3+  Shoulder adduction    Shoulder extension    Shoulder internal rotation 5 5  Shoulder external rotation 3+ 3+  Middle trapezius 3 3  Lower trapezius 2- 2-  Elbow flexion    Elbow extension    Wrist flexion    Wrist extension    Wrist ulnar deviation    Wrist radial deviation    Wrist pronation    Wrist supination    Grip strength     (Blank rows = not tested)  CERVICAL SPECIAL TESTS:  Cranial cervical flexion test: Positive  FUNCTIONAL TESTS:   Did not assess  TODAY'S TREATMENT:                                                                                                                              DATE: 06/17/23 Manual therapy: STM & TPR subocc and UTs Skilled assessment and palpation for TPDN Trigger Point Dry-Needling  Treatment instructions: Expect mild to moderate muscle soreness. S/S of pneumothorax if dry needled over a lung field, and to seek immediate medical attention should they occur. Patient verbalized understanding of these instructions and education.  Patient Consent Given: Yes Education handout provided: Yes Muscles treated:  Suboccipitals and UTs bilat Electrical stimulation performed: No Parameters: N/A Treatment response/outcome: Twitch response, decreased muscle tension Therex: see HEP below   PATIENT EDUCATION:  Education details: Exam findings, POC, initial HEP, TPDN Person educated: Patient Education method: Explanation, Demonstration, and Handouts Education comprehension: verbalized understanding, returned demonstration, and needs further education  HOME EXERCISE PROGRAM: Access Code: [email or web address] URL: [email or web address] Date: 06/17/2023 Prepared by: Vernon Prey April Kirstie Peri  Exercises - Supine Suboccipital Release with Tennis Balls  - 1 x daily - 7 x weekly - 2 sets - 10 reps - Trapezius Mobilization with Small Ball  - 1 x daily - 7 x weekly - 2 sets - 10 reps - Standing Cervical Retraction  - 2-3 x daily - 7 x weekly - 1 sets - 10 reps - Shoulder External Rotation and Scapular Retraction  - 2-3 x daily - 7 x weekly - 3 sets - 10 reps - Shoulder External Rotation in 45 Degrees Abduction  - 2-3 x daily - 7 x weekly - 1 sets - 10 reps  ASSESSMENT:  CLINICAL IMPRESSION:  Patient is a 82 y.o. F who was seen today for physical therapy evaluation and treatment for cervicalgia and headaches. Pt notes her headaches are primarily around her temples and behind her eye. Assessment significant for decreased cervical ROM with gross postural and shoulder weakness affecting comfort with all of her daily tasks. Pt is highly TTP in suboccipitals and UTs with corresponding radiating trigger points to her headaches. Pt will highly benefit from PT to address these deficits. Performed trial of TPDN this session.   OBJECTIVE IMPAIRMENTS: decreased activity tolerance, decreased endurance, decreased mobility, decreased ROM, decreased strength, increased fascial restrictions, increased muscle spasms, postural dysfunction, and pain.   ACTIVITY LIMITATIONS: carrying, lifting, and sleeping  PARTICIPATION  LIMITATIONS: driving, community activity, and yard work  PERSONAL FACTORS: Age, Fitness, Past/current experiences, and Time since onset of injury/illness/exacerbation are also affecting patient's functional outcome.   REHAB POTENTIAL: Good  CLINICAL DECISION MAKING: Evolving/moderate complexity  EVALUATION COMPLEXITY: Moderate   GOALS: Goals reviewed with patient? Yes  SHORT TERM GOALS: Target date: 07/08/2023   Pt will be ind with initial HEP Baseline:  Goal status: INITIAL  2.  Pt will report decrease in headache (frequency, intensity and/or duration) by >/=25% Baseline:  Goal status: INITIAL   LONG TERM GOALS: Target date: 07/29/2023   Pt will be ind with management and progression of HEP Baseline:  Goal status: INITIAL  2.  Pt will report decrease in headache by >/=50% Baseline:  Goal status: INITIAL  3.  Pt will have cervical lateral flexion to >/=35 deg and rotation >/=50 deg to demo improving muscle extensibility Baseline:  Goal status: INITIAL  4.  Pt will have UE MMT to >/=4/5 to demo improved postural stability Baseline:  Goal status: INITIAL  5.  Pt will have improved NDI to </=16% to demo MCID Baseline:  Goal status: INITIAL   PLAN:  PT FREQUENCY: 1x/week  PT DURATION: 6 weeks  PLANNED INTERVENTIONS: 97164- PT Re-evaluation, 97110-Therapeutic exercises, 97530- Therapeutic activity, 97112- Neuromuscular re-education, 97535- Self Care, 41324- Manual therapy, 97014- Electrical stimulation (unattended), [id number]- Electrical stimulation (manual), [id number]- Traction (mechanical), [id number]- Ionotophoresis 4mg /ml Dexamethasone, Patient/Family education, Taping, Dry Needling, Joint mobilization, Spinal mobilization, Cryotherapy, and Moist heat  PLAN FOR NEXT SESSION: Assess response to dry needling and HEP. Continue manual/TPDN as indicated for trigger points. Work on postural strengthening/stability.    Tonya Wise, PT 06/17/2023, 9:01  AM

## 2023-06-24 ENCOUNTER — Ambulatory Visit: Payer: Medicare Other | Admitting: Family Medicine

## 2023-06-24 DIAGNOSIS — I5042 Chronic combined systolic (congestive) and diastolic (congestive) heart failure: Secondary | ICD-10-CM

## 2023-06-24 DIAGNOSIS — M810 Age-related osteoporosis without current pathological fracture: Secondary | ICD-10-CM

## 2023-06-24 DIAGNOSIS — I10 Essential (primary) hypertension: Secondary | ICD-10-CM

## 2023-06-24 DIAGNOSIS — H35033 Hypertensive retinopathy, bilateral: Secondary | ICD-10-CM

## 2023-06-24 DIAGNOSIS — D696 Thrombocytopenia, unspecified: Secondary | ICD-10-CM

## 2023-06-24 DIAGNOSIS — M1990 Unspecified osteoarthritis, unspecified site: Secondary | ICD-10-CM

## 2023-06-24 DIAGNOSIS — I700 Atherosclerosis of aorta: Secondary | ICD-10-CM

## 2023-06-24 DIAGNOSIS — K219 Gastro-esophageal reflux disease without esophagitis: Secondary | ICD-10-CM

## 2023-06-24 DIAGNOSIS — E782 Mixed hyperlipidemia: Secondary | ICD-10-CM

## 2023-06-24 DIAGNOSIS — E7439 Other disorders of intestinal carbohydrate absorption: Secondary | ICD-10-CM

## 2023-06-24 DIAGNOSIS — M48062 Spinal stenosis, lumbar region with neurogenic claudication: Secondary | ICD-10-CM

## 2023-06-24 DIAGNOSIS — I427 Cardiomyopathy due to drug and external agent: Secondary | ICD-10-CM

## 2023-06-24 DIAGNOSIS — N1832 Chronic kidney disease, stage 3b: Secondary | ICD-10-CM

## 2023-06-24 DIAGNOSIS — Z8572 Personal history of non-Hodgkin lymphomas: Secondary | ICD-10-CM

## 2023-06-26 ENCOUNTER — Ambulatory Visit: Payer: Medicare Other | Admitting: Physical Therapy

## 2023-06-26 ENCOUNTER — Other Ambulatory Visit: Payer: Medicare Other

## 2023-06-26 ENCOUNTER — Ambulatory Visit: Payer: Medicare Other | Admitting: Internal Medicine

## 2023-06-26 NOTE — Therapy (Deleted)
OUTPATIENT PHYSICAL THERAPY CERVICAL EVALUATION   Patient Name: Tonya Wise MRN: [id number] DOB:[birth year], 82 y.o., female Today's Date: 06/26/2023  END OF SESSION:    Past Medical History:  Diagnosis Date   Allergy    Anemia    Ankle fracture, left    Chemotherapy induced cardiomyopathy (HCC)    a. 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   Stroke (HCC) 2015   per pt while she was in hospital for Nonhodgkins Lymphoma   Past Surgical History:  Procedure Laterality Date   ABDOMINAL HYSTERECTOMY     ANTERIOR LAT LUMBAR FUSION N/A 06/28/2020   Procedure: Lumbar two-three Anterolateral lumbar interbody fusion with lateral plate;  Surgeon: Barnett Abu, MD;  Location: [facility] OR;  Service: Neurosurgery;  Laterality: N/A;   APPENDECTOMY     BACK SURGERY     LOWER BACK TWICE   COLONOSCOPY  07/30/2006   ESOPHAGOGASTRODUODENOSCOPY N/A 03/29/2013   Procedure: ESOPHAGOGASTRODUODENOSCOPY (EGD);  Surgeon: Charna Elizabeth, MD;  Location[facility] ENDOSCOPY;  Service: Endoscopy;  Laterality: N/A;   INGUINAL LYMPH NODE BIOPSY Right 03/31/2013   Procedure: INGUINAL LYMPH NODE BIOPSY;  Surgeon: Cherylynn Ridges, MD;  Location: [facility] OR;  Service: General;  Laterality: Right;   ORIF ANKLE FRACTURE Left 11/03/2015   Procedure: OPEN REDUCTION INTERNAL FIXATION (ORIF) LEFT ANKLE FRACTURE;  Surgeon: Toni Arthurs, MD;  Location: [facility];  Service: Orthopedics;  Laterality: Left;   SKIN BIOPSY Left 02/27/2022   well differenited squamous cell carcinoma   SKIN BIOPSY Right 05/17/2023   Squamous cell carcinoma, keratoacanthoma type   SPINE SURGERY     Patient Active Problem List    Diagnosis Date Noted   Cervicalgia of occipito-atlanto-axial region 01/28/2023   Cephalalgia 01/28/2023   Cerebral ischemia 01/28/2023   Temporal headache 01/28/2023   Hypertensive retinopathy 01/02/2023   Squamous cell carcinoma of thigh, 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   Aortic atherosclerosis (HCC) 03/13/2021   Lumbar stenosis with neurogenic claudication 06/28/2020   Stage 3b chronic kidney disease (HCC) 03/09/2020   Glucose intolerance 03/09/2020   Gastroesophageal reflux disease 01/14/2019   Chemotherapy induced cardiomyopathy (HCC) 04/04/2018   Age-related osteoporosis without current pathological fracture 02/18/2017   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   Essential hypertension 09/17/2014   Hyperlipidemia 09/17/2014   Thrombocytopenia (HCC) 08/23/2013   Chronic combined systolic and diastolic heart failure (HCC) 04/28/2013   Arthritis 06/02/2012    PCP: Ronnald Nian, MD  REFERRING PROVIDER: Ihor Austin, NP  REFERRING DIAG: M54.2 (ICD-10-CM) - Cervicalgia [phone] (ICD-10-CM) - Chronic tension-type headache, intractable G43.709 (ICD-10-CM) - Chronic migraine without aura without status migrainosus, not intractable  THERAPY DIAG:  No diagnosis found.  Rationale for Evaluation and Treatment: Rehabilitation  ONSET DATE: Chronic >20 years  SUBJECTIVE:  SUBJECTIVE STATEMENT: *** Pt states she has been having headaches and has been treated with medication. Pt states it helps her sleep at night but doesn't take it all the way away. Pt reports she's had headaches for many years. Pt states her husband has passed 1.5 years ago -- has been more stressed. "If I'm physically active it takes my mind off of it." Hand dominance: Right  PERTINENT HISTORY:  PMH of TIA, anxiety, non-Hodgkin's  lymphoma s/p chemo 2015, anemia of chronic disease, HTN, cardiomyopathy, chronic systolic and diastolic heart failure and hx of PE who was evaluated by Dr. Vickey Huger on 01/28/2023 for evaluation of chronic headaches (onset 20+ years ago)  PAIN:  Are you having pain? Yes: NPRS scale: currently 6-7; at worst 8-9/10 Pain location: "behind my eyes" and "in my temples" Pain description: Dull ache Aggravating factors: stress Relieving factors: medication, "just keep on moving"  PRECAUTIONS: None  RED FLAGS: None     WEIGHT BEARING RESTRICTIONS: No  FALLS:  Has patient fallen in last 6 months? No  LIVING ENVIRONMENT: Lives with: lives alone Lives in: House/apartment  OCCUPATION: Retired - likes yard work  PLOF: Independent  PATIENT GOALS: Decrease headache  NEXT MD VISIT: 06/24/23 Medicare wellness check  OBJECTIVE:  Note: Objective measures were completed at Evaluation unless otherwise noted.  DIAGNOSTIC FINDINGS:  MRI brain showed remote small left thalamic lacunar infarct and mild age-appropriate chronic small vessel disease and generalized cerebral atrophy, no acute abnormalities noted. MRA head no significant abnormalities  PATIENT SURVEYS:  Neck Disability Index score: 13 / 50 = 26.0 %  COGNITION: Overall cognitive status: Within functional limits for tasks assessed  SENSATION: WFL  POSTURE: rounded shoulders and forward head  PALPATION: Trigger points most notable in upper trap, suboccipital and levator scap. Some in cervical paraspinals   CERVICAL ROM:   Active ROM A/PROM (deg) eval  Flexion 35  Extension 45  Right lateral flexion 23  Left lateral flexion 23  Right rotation 40  Left rotation 35   (Blank rows = not tested)  UPPER EXTREMITY ROM:  Active ROM Right eval Left eval  Shoulder flexion    Shoulder extension    Shoulder abduction    Shoulder adduction    Shoulder extension    Shoulder internal rotation    Shoulder external rotation     Elbow flexion    Elbow extension    Wrist flexion    Wrist extension    Wrist ulnar deviation    Wrist radial deviation    Wrist pronation    Wrist supination     (Blank rows = not tested)  UPPER EXTREMITY MMT:  MMT Right eval Left eval  Shoulder flexion 3+ 3+  Shoulder extension 4 4  Shoulder abduction 3+ 3+  Shoulder adduction    Shoulder extension    Shoulder internal rotation 5 5  Shoulder external rotation 3+ 3+  Middle trapezius 3 3  Lower trapezius 2- 2-  Elbow flexion    Elbow extension    Wrist flexion    Wrist extension    Wrist ulnar deviation    Wrist radial deviation    Wrist pronation    Wrist supination    Grip strength     (Blank rows = not tested)  CERVICAL SPECIAL TESTS:  Cranial cervical flexion test: Positive  FUNCTIONAL TESTS:   Did not assess  TODAY'S TREATMENT:  DATE:  06/26/23 ***  06/17/23 Manual therapy: STM & TPR subocc and UTs Skilled assessment and palpation for TPDN Trigger Point Dry-Needling  Treatment instructions: Expect mild to moderate muscle soreness. S/S of pneumothorax if dry needled over a lung field, and to seek immediate medical attention should they occur. Patient verbalized understanding of these instructions and education.  Patient Consent Given: Yes Education handout provided: Yes Muscles treated: Suboccipitals and UTs bilat Electrical stimulation performed: No Parameters: N/A Treatment response/outcome: Twitch response, decreased muscle tension Therex: see HEP below   PATIENT EDUCATION:  Education details: Exam findings, POC, initial HEP, TPDN Person educated: Patient Education method: Explanation, Demonstration, and Handouts Education comprehension: verbalized understanding, returned demonstration, and needs further education  HOME EXERCISE PROGRAM: Access Code: [email or web address] URL:  [email or web address] Date: 06/17/2023 Prepared by: Vernon Prey April Kirstie Peri  Exercises - Supine Suboccipital Release with Tennis Balls  - 1 x daily - 7 x weekly - 2 sets - 10 reps - Trapezius Mobilization with Small Ball  - 1 x daily - 7 x weekly - 2 sets - 10 reps - Standing Cervical Retraction  - 2-3 x daily - 7 x weekly - 1 sets - 10 reps - Shoulder External Rotation and Scapular Retraction  - 2-3 x daily - 7 x weekly - 3 sets - 10 reps - Shoulder External Rotation in 45 Degrees Abduction  - 2-3 x daily - 7 x weekly - 1 sets - 10 reps  ASSESSMENT:  CLINICAL IMPRESSION: *** Patient is a 82 y.o. F who was seen today for physical therapy evaluation and treatment for cervicalgia and headaches. Pt notes her headaches are primarily around her temples and behind her eye. Assessment significant for decreased cervical ROM with gross postural and shoulder weakness affecting comfort with all of her daily tasks. Pt is highly TTP in suboccipitals and UTs with corresponding radiating trigger points to her headaches. Pt will highly benefit from PT to address these deficits. Performed trial of TPDN this session.   OBJECTIVE IMPAIRMENTS: decreased activity tolerance, decreased endurance, decreased mobility, decreased ROM, decreased strength, increased fascial restrictions, increased muscle spasms, postural dysfunction, and pain.   ACTIVITY LIMITATIONS: carrying, lifting, and sleeping  PARTICIPATION LIMITATIONS: driving, community activity, and yard work  PERSONAL FACTORS: Age, Fitness, Past/current experiences, and Time since onset of injury/illness/exacerbation are also affecting patient's functional outcome.   REHAB POTENTIAL: Good  CLINICAL DECISION MAKING: Evolving/moderate complexity  EVALUATION COMPLEXITY: Moderate   GOALS: Goals reviewed with patient? Yes  SHORT TERM GOALS: Target date: 07/08/2023   Pt will be ind with initial HEP Baseline:  Goal status: INITIAL  2.   Pt will report decrease in headache (frequency, intensity and/or duration) by >/=25% Baseline:  Goal status: INITIAL   LONG TERM GOALS: Target date: 07/29/2023   Pt will be ind with management and progression of HEP Baseline:  Goal status: INITIAL  2.  Pt will report decrease in headache by >/=50% Baseline:  Goal status: INITIAL  3.  Pt will have cervical lateral flexion to >/=35 deg and rotation >/=50 deg to demo improving muscle extensibility Baseline:  Goal status: INITIAL  4.  Pt will have UE MMT to >/=4/5 to demo improved postural stability Baseline:  Goal status: INITIAL  5.  Pt will have improved NDI to </=16% to demo MCID Baseline:  Goal status: INITIAL   PLAN:  PT FREQUENCY: 1x/week  PT DURATION: 6 weeks  PLANNED INTERVENTIONS: 97164- PT Re-evaluation, 97110-Therapeutic exercises, 97530- Therapeutic activity, [id number]- Neuromuscular re-education, 97535- Self  Care, 40981- Manual therapy, 97014- Electrical stimulation (unattended), [id number]- Electrical stimulation (manual), [id number]- Traction (mechanical), [id number]- Ionotophoresis 4mg /ml Dexamethasone, Patient/Family education, Taping, Dry Needling, Joint mobilization, Spinal mobilization, Cryotherapy, and Moist heat  PLAN FOR NEXT SESSION: Assess response to dry needling and HEP. Continue manual/TPDN as indicated for trigger points. Work on postural strengthening/stability.    Glenora Morocho April Ma L Finley Dinkel, PT 06/26/2023, 12:24 PM

## 2023-06-28 ENCOUNTER — Encounter (HOSPITAL_COMMUNITY): Payer: Self-pay

## 2023-06-28 ENCOUNTER — Ambulatory Visit (HOSPITAL_COMMUNITY)
Admission: RE | Admit: 2023-06-28 | Discharge: 2023-06-28 | Disposition: A | Discharge status: Home / Self Care | Payer: Medicare Other | Source: Ambulatory Visit | Attending: Physician Assistant | Admitting: Physician Assistant

## 2023-06-28 DIAGNOSIS — Z8572 Personal history of non-Hodgkin lymphomas: Secondary | ICD-10-CM | POA: Insufficient documentation

## 2023-06-28 DIAGNOSIS — D7389 Other diseases of spleen: Secondary | ICD-10-CM | POA: Diagnosis not present

## 2023-06-28 DIAGNOSIS — K8020 Calculus of gallbladder without cholecystitis without obstruction: Secondary | ICD-10-CM | POA: Diagnosis not present

## 2023-06-28 LAB — POCT I-STAT CREATININE: Creatinine, Ser: 1.3 mg/dL — ABNORMAL HIGH (ref 0.44–1.00)

## 2023-06-28 MED ORDER — IOHEXOL 300 MG/ML  SOLN
30.0000 mL | Freq: Once | INTRAMUSCULAR | Status: AC | PRN
  Administered 2023-06-28: 30 mL via ORAL

## 2023-06-28 MED ORDER — IOHEXOL 300 MG/ML  SOLN
100.0000 mL | Freq: Once | INTRAMUSCULAR | Status: AC | PRN
  Administered 2023-06-28: 80 mL via INTRAVENOUS

## 2023-07-01 ENCOUNTER — Ambulatory Visit: Payer: Medicare Other | Attending: Family Medicine | Admitting: Physical Therapy

## 2023-07-01 NOTE — Therapy (Incomplete)
OUTPATIENT PHYSICAL THERAPY CERVICAL TREATMENT   Patient Name: Tonya Wise MRN: [id number] DOB:Jan 04, 1941, 82 y.o., female Today's Date: 07/01/2023  END OF SESSION:    Past Medical History:  Diagnosis Date   Allergy    Anemia    Ankle fracture, left    Chemotherapy induced cardiomyopathy (HCC)    a. 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   Stroke (HCC) 2015   per pt while she was in hospital for Nonhodgkins Lymphoma   Past Surgical History:  Procedure Laterality Date   ABDOMINAL HYSTERECTOMY     ANTERIOR LAT LUMBAR FUSION N/A 06/28/2020   Procedure: Lumbar two-three Anterolateral lumbar interbody fusion with lateral plate;  Surgeon: Barnett Abu, MD;  Location: [facility] OR;  Service: Neurosurgery;  Laterality: N/A;   APPENDECTOMY     BACK SURGERY     LOWER BACK TWICE   COLONOSCOPY  07/30/2006   ESOPHAGOGASTRODUODENOSCOPY N/A 03/29/2013   Procedure: ESOPHAGOGASTRODUODENOSCOPY (EGD);  Surgeon: Charna Elizabeth, MD;  Location: [facility] ENDOSCOPY;  Service: Endoscopy;  Laterality: N/A;   INGUINAL LYMPH NODE BIOPSY Right 03/31/2013   Procedure: INGUINAL LYMPH NODE BIOPSY;  Surgeon: Cherylynn Ridges, MD;  Location: [facility] OR;  Service: General;  Laterality: Right;   ORIF ANKLE FRACTURE Left 11/03/2015   Procedure: OPEN REDUCTION INTERNAL FIXATION (ORIF) LEFT ANKLE FRACTURE;  Surgeon: Toni Arthurs, MD;  Location: [facility];  Service: Orthopedics;  Laterality: Left;   SKIN BIOPSY Left 02/27/2022   well differenited squamous cell carcinoma   SKIN BIOPSY Right 05/17/2023   Squamous cell carcinoma, keratoacanthoma type   SPINE SURGERY     Patient Active Problem List    Diagnosis Date Noted   Cervicalgia of occipito-atlanto-axial region 01/28/2023   Cephalalgia 01/28/2023   Cerebral ischemia 01/28/2023   Temporal headache 01/28/2023   Hypertensive retinopathy 01/02/2023   Squamous cell carcinoma of thigh, 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   Aortic atherosclerosis (HCC) 03/13/2021   Lumbar stenosis with neurogenic claudication 06/28/2020   Stage 3b chronic kidney disease (HCC) 03/09/2020   Glucose intolerance 03/09/2020   Gastroesophageal reflux disease 01/14/2019   Chemotherapy induced cardiomyopathy (HCC) 04/04/2018   Age-related osteoporosis without current pathological fracture 02/18/2017   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   Essential hypertension 09/17/2014   Hyperlipidemia 09/17/2014   Thrombocytopenia (HCC) 08/23/2013   Chronic combined systolic and diastolic heart failure (HCC) 04/28/2013   Arthritis 06/02/2012    PCP: Ronnald Nian, MD  REFERRING PROVIDER: Ihor Austin, NP  REFERRING DIAG: M54.2 (ICD-10-CM) - Cervicalgia [phone] (ICD-10-CM) - Chronic tension-type headache, intractable G43.709 (ICD-10-CM) - Chronic migraine without aura without status migrainosus, not intractable  THERAPY DIAG:  No diagnosis found.  Rationale for Evaluation and Treatment: Rehabilitation  ONSET DATE: Chronic >20 years  SUBJECTIVE:  SUBJECTIVE STATEMENT: *** Pt states she has been having headaches and has been treated with medication. Pt states it helps her sleep at night but doesn't take it all the way away. Pt reports she's had headaches for many years. Pt states her husband has passed 1.5 years ago -- has been more stressed. "If I'm physically active it takes my mind off of it." Hand dominance: Right  PERTINENT HISTORY:  PMH of TIA, anxiety, non-Hodgkin's  lymphoma s/p chemo 2015, anemia of chronic disease, HTN, cardiomyopathy, chronic systolic and diastolic heart failure and hx of PE who was evaluated by Dr. Vickey Huger on 01/28/2023 for evaluation of chronic headaches (onset 20+ years ago)  PAIN: *** Are you having pain? Yes: NPRS scale: currently 6-7; at worst 8-9/10 Pain location: "behind my eyes" and "in my temples" Pain description: Dull ache Aggravating factors: stress Relieving factors: medication, "just keep on moving"  PRECAUTIONS: None  RED FLAGS: None     WEIGHT BEARING RESTRICTIONS: No  FALLS:  Has patient fallen in last 6 months? No  LIVING ENVIRONMENT: Lives with: lives alone Lives in: House/apartment  OCCUPATION: Retired - likes yard work  PLOF: Independent  PATIENT GOALS: Decrease headache  NEXT MD VISIT: 06/24/23 Medicare wellness check  OBJECTIVE:  Note: Objective measures were completed at Evaluation unless otherwise noted.  DIAGNOSTIC FINDINGS:  MRI brain showed remote small left thalamic lacunar infarct and mild age-appropriate chronic small vessel disease and generalized cerebral atrophy, no acute abnormalities noted. MRA head no significant abnormalities  PATIENT SURVEYS:  Neck Disability Index score: 13 / 50 = 26.0 %  COGNITION: Overall cognitive status: Within functional limits for tasks assessed  SENSATION: WFL  POSTURE: rounded shoulders and forward head  PALPATION: Trigger points most notable in upper trap, suboccipital and levator scap. Some in cervical paraspinals   CERVICAL ROM:   Active ROM A/PROM (deg) eval  Flexion 35  Extension 45  Right lateral flexion 23  Left lateral flexion 23  Right rotation 40  Left rotation 35   (Blank rows = not tested)  UPPER EXTREMITY ROM:  Active ROM Right eval Left eval  Shoulder flexion    Shoulder extension    Shoulder abduction    Shoulder adduction    Shoulder extension    Shoulder internal rotation    Shoulder external rotation     Elbow flexion    Elbow extension    Wrist flexion    Wrist extension    Wrist ulnar deviation    Wrist radial deviation    Wrist pronation    Wrist supination     (Blank rows = not tested)  UPPER EXTREMITY MMT:  MMT Right eval Left eval  Shoulder flexion 3+ 3+  Shoulder extension 4 4  Shoulder abduction 3+ 3+  Shoulder adduction    Shoulder extension    Shoulder internal rotation 5 5  Shoulder external rotation 3+ 3+  Middle trapezius 3 3  Lower trapezius 2- 2-  Elbow flexion    Elbow extension    Wrist flexion    Wrist extension    Wrist ulnar deviation    Wrist radial deviation    Wrist pronation    Wrist supination    Grip strength     (Blank rows = not tested)  CERVICAL SPECIAL TESTS:  Cranial cervical flexion test: Positive  FUNCTIONAL TESTS:   Did not assess  TODAY'S TREATMENT:  DATE: 06/17/23 *** Manual therapy: STM & TPR subocc and UTs Skilled assessment and palpation for TPDN Trigger Point Dry-Needling  Treatment instructions: Expect mild to moderate muscle soreness. S/S of pneumothorax if dry needled over a lung field, and to seek immediate medical attention should they occur. Patient verbalized understanding of these instructions and education.  Patient Consent Given: Yes Education handout provided: Yes Muscles treated: Suboccipitals and UTs bilat Electrical stimulation performed: No Parameters: N/A Treatment response/outcome: Twitch response, decreased muscle tension Therex: see HEP below   PATIENT EDUCATION: *** Education details: Exam findings, POC, initial HEP, TPDN Person educated: Patient Education method: Explanation, Demonstration, and Handouts Education comprehension: verbalized understanding, returned demonstration, and needs further education  HOME EXERCISE PROGRAM: *** Access Code: [email or web address] URL:  [email or web address] Date: 06/17/2023 Prepared by: Vernon Prey April Kirstie Peri  Exercises - Supine Suboccipital Release with Tennis Balls  - 1 x daily - 7 x weekly - 2 sets - 10 reps - Trapezius Mobilization with Small Ball  - 1 x daily - 7 x weekly - 2 sets - 10 reps - Standing Cervical Retraction  - 2-3 x daily - 7 x weekly - 1 sets - 10 reps - Shoulder External Rotation and Scapular Retraction  - 2-3 x daily - 7 x weekly - 3 sets - 10 reps - Shoulder External Rotation in 45 Degrees Abduction  - 2-3 x daily - 7 x weekly - 1 sets - 10 reps  ASSESSMENT:  CLINICAL IMPRESSION: *** Patient is a 82 y.o. F who was seen today for physical therapy evaluation and treatment for cervicalgia and headaches. Pt notes her headaches are primarily around her temples and behind her eye. Assessment significant for decreased cervical ROM with gross postural and shoulder weakness affecting comfort with all of her daily tasks. Pt is highly TTP in suboccipitals and UTs with corresponding radiating trigger points to her headaches. Pt will highly benefit from PT to address these deficits. Performed trial of TPDN this session.   OBJECTIVE IMPAIRMENTS: decreased activity tolerance, decreased endurance, decreased mobility, decreased ROM, decreased strength, increased fascial restrictions, increased muscle spasms, postural dysfunction, and pain.   ACTIVITY LIMITATIONS: carrying, lifting, and sleeping  PARTICIPATION LIMITATIONS: driving, community activity, and yard work  PERSONAL FACTORS: Age, Fitness, Past/current experiences, and Time since onset of injury/illness/exacerbation are also affecting patient's functional outcome.   REHAB POTENTIAL: Good  CLINICAL DECISION MAKING: Evolving/moderate complexity  EVALUATION COMPLEXITY: Moderate   GOALS: Goals reviewed with patient? Yes  SHORT TERM GOALS: Target date: 07/08/2023   Pt will be ind with initial HEP Baseline:  Goal status: INITIAL  2.   Pt will report decrease in headache (frequency, intensity and/or duration) by >/=25% Baseline:  Goal status: INITIAL   LONG TERM GOALS: Target date: 07/29/2023   Pt will be ind with management and progression of HEP Baseline:  Goal status: INITIAL  2.  Pt will report decrease in headache by >/=50% Baseline:  Goal status: INITIAL  3.  Pt will have cervical lateral flexion to >/=35 deg and rotation >/=50 deg to demo improving muscle extensibility Baseline:  Goal status: INITIAL  4.  Pt will have UE MMT to >/=4/5 to demo improved postural stability Baseline:  Goal status: INITIAL  5.  Pt will have improved NDI to </=16% to demo MCID Baseline:  Goal status: INITIAL   PLAN:  PT FREQUENCY: 1x/week  PT DURATION: 6 weeks  PLANNED INTERVENTIONS: 97164- PT Re-evaluation, 97110-Therapeutic exercises, 97530- Therapeutic activity, [id number]- Neuromuscular re-education, 97535- Self Care, [id number]-  Manual therapy, 97014- Electrical stimulation (unattended), [id number]- Electrical stimulation (manual), 78295- Traction (mechanical), 62130- Ionotophoresis 4mg /ml Dexamethasone, Patient/Family education, Taping, Dry Needling, Joint mobilization, Spinal mobilization, Cryotherapy, and Moist heat  PLAN FOR NEXT SESSION: Assess response to dry needling and HEP. Continue manual/TPDN as indicated for trigger points. Work on postural strengthening/stability.  ***  Beverely Low, Student-PT 07/01/2023, 11:06 AM

## 2023-07-02 ENCOUNTER — Other Ambulatory Visit: Payer: Self-pay | Admitting: Adult Health

## 2023-07-03 ENCOUNTER — Inpatient Hospital Stay (HOSPITAL_BASED_OUTPATIENT_CLINIC_OR_DEPARTMENT_OTHER): Payer: Medicare Other | Admitting: Internal Medicine

## 2023-07-03 ENCOUNTER — Other Ambulatory Visit: Payer: Self-pay | Admitting: Family Medicine

## 2023-07-03 ENCOUNTER — Inpatient Hospital Stay: Payer: Medicare Other | Attending: Internal Medicine

## 2023-07-03 VITALS — BP 151/64 | HR 55 | Temp 97.6°F | Resp 15 | Ht 63.0 in | Wt 130.6 lb

## 2023-07-03 DIAGNOSIS — Z8572 Personal history of non-Hodgkin lymphomas: Secondary | ICD-10-CM | POA: Insufficient documentation

## 2023-07-03 DIAGNOSIS — M19042 Primary osteoarthritis, left hand: Secondary | ICD-10-CM | POA: Diagnosis not present

## 2023-07-03 DIAGNOSIS — M810 Age-related osteoporosis without current pathological fracture: Secondary | ICD-10-CM

## 2023-07-03 DIAGNOSIS — M19041 Primary osteoarthritis, right hand: Secondary | ICD-10-CM | POA: Insufficient documentation

## 2023-07-03 LAB — CBC WITH DIFFERENTIAL (CANCER CENTER ONLY)
Abs Immature Granulocytes: 0.02 10*3/uL (ref 0.00–0.07)
Basophils Absolute: 0 10*3/uL (ref 0.0–0.1)
Basophils Relative: 0 %
Eosinophils Absolute: 0.1 10*3/uL (ref 0.0–0.5)
Eosinophils Relative: 2 %
HCT: 37.2 % (ref 36.0–46.0)
Hemoglobin: 12.8 g/dL (ref 12.0–15.0)
Immature Granulocytes: 0 %
Lymphocytes Relative: 29 %
Lymphs Abs: 1.5 10*3/uL (ref 0.7–4.0)
MCH: 29.2 pg (ref 26.0–34.0)
MCHC: 34.4 g/dL (ref 30.0–36.0)
MCV: 84.7 fL (ref 80.0–100.0)
Monocytes Absolute: 0.4 10*3/uL (ref 0.1–1.0)
Monocytes Relative: 8 %
Neutro Abs: 3.2 10*3/uL (ref 1.7–7.7)
Neutrophils Relative %: 61 %
Platelet Count: 134 10*3/uL — ABNORMAL LOW (ref 150–400)
RBC: 4.39 MIL/uL (ref 3.87–5.11)
RDW: 13.9 % (ref 11.5–15.5)
WBC Count: 5.2 10*3/uL (ref 4.0–10.5)
nRBC: 0 % (ref 0.0–0.2)

## 2023-07-03 LAB — CMP (CANCER CENTER ONLY)
ALT: 13 U/L (ref 0–44)
AST: 24 U/L (ref 15–41)
Albumin: 3.9 g/dL (ref 3.5–5.0)
Alkaline Phosphatase: 56 U/L (ref 38–126)
Anion gap: 6 (ref 5–15)
BUN: 11 mg/dL (ref 8–23)
CO2: 29 mmol/L (ref 22–32)
Calcium: 9.1 mg/dL (ref 8.9–10.3)
Chloride: 107 mmol/L (ref 98–111)
Creatinine: 1.09 mg/dL — ABNORMAL HIGH (ref 0.44–1.00)
GFR, Estimated: 51 mL/min — ABNORMAL LOW (ref >60)
Glucose, Bld: 83 mg/dL (ref 70–99)
Potassium: 3.4 mmol/L — ABNORMAL LOW (ref 3.5–5.1)
Sodium: 142 mmol/L (ref 135–145)
Total Bilirubin: 0.8 mg/dL (ref <1.2)
Total Protein: 6.1 g/dL — ABNORMAL LOW (ref 6.5–8.1)

## 2023-07-03 LAB — LACTATE DEHYDROGENASE: LDH: 210 U/L — ABNORMAL HIGH (ref 98–192)

## 2023-07-03 NOTE — Progress Notes (Signed)
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  PRIOR THERAPY: Oncology History  Non-Hodgkins lymphoma [facility]) (Resolved)  03/27/2013 - 03/31/2013 Hospital Admission   Admitted to [facility] for profound anemia (Hgb 6.4).  C/o fevers, drenching nightsweats, weight lost.  Negative colonoscopy and EGD.  CT of C/A/P demonstrated significant lymphadenopathy suspcious of ympha.     03/31/2013 Initial Diagnosis   Biopsy of R inguinal lymph node revealed Non-Hodgkins lymphoma. Diffuse Large B-cell Lymphoma   04/10/2013 - 04/24/2013 Hospital Admission   Hypercalcemia, renal failure and dehydration.  Started min-RCHOP.    04/15/2013 Bone Marrow Biopsy   Involvement with Diffuse Large B-cell Lymphoma. Stage IV NHL w B-symptoms.    04/18/2013 - 04/18/2013 Chemotherapy   Mini R-CHOP Cycle #1 on 04/18/2013. Dose reduced to poor nutrion. It consisted on Cytoxan 400 mg/m2,Doxorubicin 25 mg/m2, Rituximab 375 mg/m2, Vincristine 1mg .  Prednisone 100 mg daily for 9/13 - 9/16.   Received neupogen on 9/21, 9/27 -9/30.     04/25/2013 - 04/29/2013 Hospital Admission   Admitted due to worsening mouth sores/oral thrush, odynophagia, failure to thrive.  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  Discharge to [facility] (Dr. Angelina Ok) and discharged to home on 05/19/2013.     06/01/2013 - 06/01/2013 Chemotherapy   Mini R-CHO #2 dosed as above.  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  Received neulasta shot on 06/02/13   06/16/2013 Cancer Staging   Re-staging PET/CT demonstrated significant interval reduction in axillary lymphadenopathy.  There was 1 small residual right axillary lymph node measuring 16 x 12 mm which showed FDG uptake.  Signifcant  reduction in mediastinal and hilar lymphadenopathy.    06/16/2013 Imaging   CT of chest noted left-sided pulmonary embolim. Started on lovenox to coumadin bridge.     06/22/2013 - 06/22/2013 Chemotherapy   Mini-R-CHO #3.  Received Neulasta on 06/23/2013.     07/16/2013 Imaging   2-D Echocardiogram demonstrates 35-45% EF with Grade 2 diastolic dysfunction.  Doxorubicin discontinued with consultation by cardiology (Dr. Tresa Endo).     08/19/2013 - 08/21/2013 Chemotherapy   Mini-R-CEO #4.  Etoposide 25 mg/m2 instead of doxo due to above.  Etoposide 50mg  bid on day 2/3.  On day 3 (08/21/2013) she received 1 of two oral doses of ectoposide.    08/21/2013 - 08/26/2013 Hospital Admission   Admitted to [facility] due to acute respiratory failure and had influenzae A and pneumonia. Completed tamiflu and oral antibiotics.    09/25/2013 - 09/27/2013 Chemotherapy   Mini-R-CEO #5. Etoposide 25 mg/m2 25 mg bid on days 2/3.    10/16/2013 - 10/19/2013 Chemotherapy   Mini-R-CEO # 6. Etoposide 25 mg/m2 bid on days 2/3. Neulasta on day#4.    11/05/2013 Imaging   Interval resolution of the hypermetabolism identified within the enlarged right axillary lymph node seen previously. This lymph node has also decreased markedly in size in the interval. No new hypermetabolic lesions in the neck, chest, abdomen, or  pelvis.    11/19/2013 Bone Marrow Biopsy   There is no evidence of a B-cell lymphoproliferative process in this material. Normal cytogenetics.    04/16/2014 Imaging   CT scan Chest, abdomen: No evidence of recurrent lymphoma within the chest or abdomen.   Significant decrease in splenomegaly since prior exam.  CURRENT THERAPY: Observation.  INTERVAL HISTORY: Tonya Wise 82 y.o. female returns to the clinic today for annual follow-up visit.Discussed the use of AI scribe software for clinical note transcription with the patient, who gave verbal consent to proceed.  History of Present Illness   The  patient, an 82 year old individual diagnosed with stage 4 large B-cell non-Hodgkin lymphoma in 2014, underwent treatment with CHOP and Rituxan. However, the treatment was discontinued after a few cycles due to side effects. The patient has been under observation for approximately nine years since then. She reports feeling great with no significant complaints other than the typical aging process, such as arthritis in her hands, which has limited her ability to perform certain activities. The patient remains active, doing her own yard work. She denies any recent weight loss or palpable lymph nodes. She did experience a period of weight loss associated with decreased appetite following the death of her spouse, but she has since regained the weight. The patient denies any chest pain or breathing issues.      MEDICAL HISTORY: Past Medical History:  Diagnosis Date   Allergy    Anemia    Ankle fracture, left    Chemotherapy induced cardiomyopathy (HCC)    a. 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  MEDICATIONS:  Current Outpatient Medications  Medication Sig Dispense Refill   verapamil (CALAN-SR) 120 MG CR tablet Take 1 tablet (120 mg total) by mouth at bedtime. 30 tablet 5   alendronate (FOSAMAX) 70 MG tablet TAKE 1 TABLET BY MOUTH EVERY 7 DAYS. TAKE WITH A FULL GLASS OF WATER ON AN EMPTY STOMACH 12 tablet 0   ALPRAZolam (XANAX) 0.25 MG tablet TAKE 1 TABLET BY MOUTH 2 TIMES DAILY AS NEEDED FOR ANXIETY. 30 tablet 0   amLODipine (NORVASC) 5 MG tablet  Take 1 tablet (5 mg total) by mouth daily. 90 tablet 3   aspirin EC 81 MG tablet Take 1 tablet (81 mg total) by mouth 2 (two) times daily. (Patient taking differently: Take 81 mg by mouth daily.) 90 tablet 0   atorvastatin (LIPITOR) 20 MG tablet Take 1 tablet (20 mg total) by mouth daily. 90 tablet 3   losartan (COZAAR) 100 MG tablet Take 1 tablet (100 mg total) by mouth daily. 90 tablet 3   Melatonin 3 MG CAPS Take 3 mg by mouth at bedtime as needed (sleep).     metoprolol succinate (TOPROL-XL) 100 MG 24 hr tablet Take 1 tablet (100 mg total) by mouth daily. TAKE WITH OR IMMEDIATELY FOLLOWING A MEAL. Please call [phone] to schedule an appointment for future refills. Thank you. 30 tablet 0   Rimegepant Sulfate (NURTEC) 75 MG TBDP Take 1 tablet (75 mg total) by mouth as needed. 16 tablet 11   No current facility-administered medications for this visit.    SURGICAL HISTORY:  Past Surgical History:  Procedure Laterality Date   ABDOMINAL HYSTERECTOMY     ANTERIOR LAT LUMBAR FUSION N/A 06/28/2020  Procedure: Lumbar two-three Anterolateral lumbar interbody fusion with lateral plate;  Surgeon: Barnett Abu, MD;  Location: [facility] OR;  Service: Neurosurgery;  Laterality: N/A;   APPENDECTOMY     BACK SURGERY     LOWER BACK TWICE   COLONOSCOPY  07/30/2006   ESOPHAGOGASTRODUODENOSCOPY N/A 03/29/2013   Procedure: ESOPHAGOGASTRODUODENOSCOPY (EGD);  Surgeon: Charna Elizabeth, MD;  Location: [facility] ENDOSCOPY;  Service: Endoscopy;  Laterality: N/A;   INGUINAL LYMPH NODE BIOPSY Right 03/31/2013   Procedure: INGUINAL LYMPH NODE BIOPSY;  Surgeon: Cherylynn Ridges, MD;  Location: [facility] OR;  Service: General;  Laterality: Right;   ORIF ANKLE FRACTURE Left 11/03/2015   Procedure: OPEN REDUCTION INTERNAL FIXATION (ORIF) LEFT ANKLE FRACTURE;  Surgeon: Toni Arthurs, MD;  Location: [facility];  Service: Orthopedics;  Laterality: Left;   SKIN BIOPSY Left 02/27/2022   well differenited squamous cell carcinoma    SKIN BIOPSY Right 05/17/2023   Squamous cell carcinoma, keratoacanthoma type   SPINE SURGERY      REVIEW OF SYSTEMS:  A comprehensive review of systems was negative except for: Musculoskeletal: positive for arthralgias   PHYSICAL EXAMINATION: General appearance: alert, cooperative and no distress Head: Normocephalic, without obvious abnormality, atraumatic Neck: no adenopathy, no JVD, supple, symmetrical, trachea midline and thyroid not enlarged, symmetric, no tenderness/mass/nodules Lymph nodes: Cervical, supraclavicular, and axillary nodes normal. Resp: clear to auscultation bilaterally Back: symmetric, no curvature. ROM normal. No CVA tenderness. Cardio: regular rate and rhythm, S1, S2 normal, no murmur, click, rub or gallop GI: soft, non-tender; bowel sounds normal; no masses,  no organomegaly Extremities: extremities normal, atraumatic, no cyanosis or edema  ECOG PERFORMANCE STATUS: 1 - Symptomatic but completely ambulatory  Blood pressure (!) 151/64, pulse (!) 55, temperature 97.6 F (36.4 C), temperature source Temporal, resp. rate 15, height 5\' 3"  (1.6 m), weight 130 lb 9.6 oz (59.2 kg), SpO2 98%.  LABORATORY DATA: Lab Results  Component Value Date   WBC 5.2 07/03/2023   HGB 12.8 07/03/2023   HCT 37.2 07/03/2023   MCV 84.7 07/03/2023   PLT 134 (L) 07/03/2023      Chemistry      Component Value Date/Time   NA 142 07/03/2023 1355   NA 139 12/05/2020 0949   NA 141 12/12/2016 0813   K 3.4 (L) 07/03/2023 1355   K 4.0 12/12/2016 0813   CL 107 07/03/2023 1355   CO2 29 07/03/2023 1355   CO2 26 12/12/2016 0813   BUN 11 07/03/2023 1355   BUN 16 12/05/2020 0949   BUN 20.3 12/12/2016 0813   CREATININE 1.09 (H) 07/03/2023 1355   CREATININE 1.46 (H) 08/05/2017 1639   CREATININE 1.3 (H) 12/12/2016 0813      Component Value Date/Time   CALCIUM 9.1 07/03/2023 1355   CALCIUM 8.9 12/12/2016 0813   ALKPHOS 56 07/03/2023 1355   ALKPHOS 55 12/12/2016 0813   AST 24 07/03/2023  1355   AST 13 12/12/2016 0813   ALT 13 07/03/2023 1355   ALT 10 12/12/2016 0813   BILITOT 0.8 07/03/2023 1355   BILITOT 0.55 12/12/2016 0813       RADIOGRAPHIC STUDIES: CT CHEST ABDOMEN PELVIS W CONTRAST  Result Date: 06/28/2023 CLINICAL DATA:  History of non-Hodgkin's lymphoma, B-cell lymphoma. Follow-up. * Tracking Code: BO * EXAM: CT CHEST, ABDOMEN, AND PELVIS WITH CONTRAST TECHNIQUE: Multidetector CT imaging of the chest, abdomen and pelvis was performed following the standard protocol during bolus administration of intravenous contrast. RADIATION DOSE REDUCTION: This exam was performed according to the  departmental dose-optimization program which includes automated exposure control, adjustment of the mA and/or kV according to patient size and/or use of iterative reconstruction technique. CONTRAST:  80mL OMNIPAQUE IOHEXOL 300 MG/ML  SOLN COMPARISON:  Multiple priors including CT June 12, 2022 and Dec 04, 2021 FINDINGS: CT CHEST FINDINGS Cardiovascular: Aortic atherosclerosis. No central pulmonary embolus on this nondedicated study. Normal size heart. Three-vessel coronary artery calcifications. No significant pericardial effusion/thickening. Mediastinum/Nodes: No suspicious thyroid nodule. Prominent precarinal lymph node measures 8 mm on image 25/2, unchanged. No pathologically enlarged thoracic lymph nodes. The esophagus is grossly unremarkable. Lungs/Pleura: No suspicious pulmonary nodules or masses. A few scattered tiny ground-glass nodules for instance a 3-4 mm nodule in the right upper lobe on image 55/7 are stable from prior examination and favored benign. Scattered atelectasis/scarring. Biapical pleuroparenchymal scarring. No pleural effusion. No pneumothorax. Musculoskeletal: Bilateral breast prostheses. No aggressive lytic or blastic lesion of bone. Multilevel degenerative changes spine. CT ABDOMEN PELVIS FINDINGS Hepatobiliary: No suspicious hepatic lesion. Cholelithiasis without  findings of acute cholecystitis. No biliary ductal dilation. Pancreas: No pancreatic ductal dilation or evidence of acute inflammation. Spleen: No splenomegaly. Previously identified splenic lesions are no longer confidently identifiable. Adrenals/Urinary Tract: No suspicious adrenal nodule/mass. No hydronephrosis. Kidneys demonstrate symmetric enhancement. Urinary bladder is unremarkable for degree of distension. Stomach/Bowel: Radiopaque enteric contrast material traverses the splenic flexure. Stomach is nondistended limiting evaluation. No pathologic dilation of small or large bowel. Moderate volume of formed stool in the colon. Vascular/Lymphatic: Aortic atherosclerosis. Smooth IVC contours. The portal, splenic and superior mesenteric veins are patent. No pathologically enlarged abdominal or pelvic lymph nodes. Reproductive: Unremarkable by CT. Other: No significant abdominopelvic free fluid. Musculoskeletal: No aggressive lytic or blastic lesion of bone. Lateral and posterior lumbar fusion hardware. No acute osseous abnormality. IMPRESSION: 1. Previously identified splenic lesions are no longer confidently identifiable. 2. No pathologically enlarged lymph nodes in the chest, abdomen or pelvis. 3. Cholelithiasis without findings of acute cholecystitis. 4. Moderate volume of formed stool in the colon. 5.  Aortic Atherosclerosis (ICD10-I70.0). Electronically Signed   By: Maudry Mayhew M.D.   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 The patient has been on observation for the last 9 years. Assessment and Plan    Stage IV Large B Cell Non-Hodgkin Lymphoma Diagnosed in 2014. Treated with CHOP and Rituxan initially, but treatment was stopped due to side  effects. Under observation for nearly nine years with no concerning findings on recent scans or blood work. No new symptoms such as weight loss, palpable lymph nodes, chest pain, or dyspnea. Recent weight loss attributed to bereavement, with weight now returning to normal. 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 Symptoms consistent with typical aging process. - Continue current management and self-care for arthritis symptoms  General Health Maintenance No specific general health maintenance issues discussed beyond the context of lymphoma follow-up. - Continue routine health maintenance and screenings as per standard guidelines.   She was advised to call immediately if she has any other concerning symptoms in the interval.  The patient voices understanding of current disease status and treatment options and is  in agreement with the current care plan. All questions were answered. The patient knows to call the clinic with any problems, questions or concerns. We can certainly see the patient much sooner if necessary.   Disclaimer: This note was dictated with voice recognition software. Similar sounding words can inadvertently be transcribed and may not be corrected upon review.

## 2023-07-08 ENCOUNTER — Ambulatory Visit: Payer: Medicare Other | Admitting: Physical Therapy

## 2023-07-15 ENCOUNTER — Ambulatory Visit: Payer: Medicare Other | Admitting: Physical Therapy

## 2023-07-22 ENCOUNTER — Ambulatory Visit: Payer: Medicare Other | Admitting: Physical Therapy

## 2023-07-24 ENCOUNTER — Other Ambulatory Visit: Payer: Self-pay | Admitting: Adult Health

## 2023-07-24 DIAGNOSIS — I700 Atherosclerosis of aorta: Secondary | ICD-10-CM

## 2023-07-24 DIAGNOSIS — E785 Hyperlipidemia, unspecified: Secondary | ICD-10-CM

## 2023-07-28 ENCOUNTER — Other Ambulatory Visit: Payer: Self-pay | Admitting: Adult Health

## 2023-07-29 ENCOUNTER — Ambulatory Visit: Payer: Medicare Other | Admitting: Physical Therapy

## 2023-07-31 ENCOUNTER — Other Ambulatory Visit: Payer: Self-pay | Admitting: Adult Health

## 2023-07-31 ENCOUNTER — Other Ambulatory Visit: Payer: Self-pay | Admitting: Family Medicine

## 2023-07-31 DIAGNOSIS — M810 Age-related osteoporosis without current pathological fracture: Secondary | ICD-10-CM

## 2023-08-08 ENCOUNTER — Other Ambulatory Visit: Payer: Self-pay | Admitting: Adult Health

## 2023-08-08 DIAGNOSIS — E785 Hyperlipidemia, unspecified: Secondary | ICD-10-CM

## 2023-08-08 DIAGNOSIS — I700 Atherosclerosis of aorta: Secondary | ICD-10-CM

## 2023-08-17 ENCOUNTER — Other Ambulatory Visit: Payer: Self-pay | Admitting: Adult Health

## 2023-08-23 ENCOUNTER — Other Ambulatory Visit: Payer: Self-pay | Admitting: Adult Health

## 2023-08-23 DIAGNOSIS — I700 Atherosclerosis of aorta: Secondary | ICD-10-CM

## 2023-08-23 DIAGNOSIS — E785 Hyperlipidemia, unspecified: Secondary | ICD-10-CM

## 2023-08-27 ENCOUNTER — Other Ambulatory Visit: Payer: Self-pay | Admitting: Adult Health

## 2023-08-27 DIAGNOSIS — E785 Hyperlipidemia, unspecified: Secondary | ICD-10-CM

## 2023-08-27 DIAGNOSIS — I700 Atherosclerosis of aorta: Secondary | ICD-10-CM

## 2023-08-30 ENCOUNTER — Other Ambulatory Visit: Payer: Self-pay | Admitting: Adult Health

## 2023-09-10 ENCOUNTER — Other Ambulatory Visit: Payer: Self-pay | Admitting: Adult Health

## 2023-09-19 ENCOUNTER — Other Ambulatory Visit: Payer: Self-pay | Admitting: Adult Health

## 2023-09-20 ENCOUNTER — Other Ambulatory Visit: Payer: Self-pay | Admitting: Adult Health

## 2023-09-23 ENCOUNTER — Other Ambulatory Visit: Payer: Self-pay | Admitting: Cardiovascular Disease

## 2023-09-24 ENCOUNTER — Other Ambulatory Visit: Payer: Self-pay | Admitting: Adult Health

## 2023-09-24 ENCOUNTER — Encounter: Payer: Self-pay | Admitting: Internal Medicine

## 2023-10-03 ENCOUNTER — Other Ambulatory Visit: Payer: Self-pay | Admitting: Adult Health

## 2023-10-08 ENCOUNTER — Ambulatory Visit: Payer: Medicare Other

## 2023-10-08 VITALS — BP 138/70 | HR 65 | Temp 97.7°F | Ht 63.0 in | Wt 130.0 lb

## 2023-10-08 DIAGNOSIS — Z0000 Encounter for general adult medical examination without abnormal findings: Secondary | ICD-10-CM | POA: Diagnosis not present

## 2023-10-08 NOTE — Patient Instructions (Signed)
 Ms. Modisette , Thank you for taking time to come for your Medicare Wellness Visit. I appreciate your ongoing commitment to your health goals. Please review the following plan we discussed and let me know if I can assist you in the future.   Referrals/Orders/Follow-Ups/Clinician Recommendations: none  This is a list of the screening recommended for you and due dates:  Health Maintenance  Topic Date Due   Yearly kidney health urinalysis for diabetes  Never done   COVID-19 Vaccine (7 - 2024-25 season) 07/16/2023   Yearly kidney function blood test for diabetes  07/02/2024   DTaP/Tdap/Td vaccine (2 - Td or Tdap) 07/17/2024   Medicare Annual Wellness Visit  10/07/2024   Pneumonia Vaccine  Completed   Flu Shot  Completed   DEXA scan (bone density measurement)  Completed   Zoster (Shingles) Vaccine  Completed   HPV Vaccine  Aged Out   Complete foot exam   Discontinued   Hemoglobin A1C  Discontinued   Eye exam for diabetics  Discontinued   Hepatitis C Screening  Discontinued    Advanced directives: (Declined) Advance directive discussed with you today. Even though you declined this today, please call our office should you change your mind, and we can give you the proper paperwork for you to fill out.  Next Medicare Annual Wellness Visit scheduled for next year: Yes  insert Preventive Care attachment Insert FALL PREVENTION attachment if needed

## 2023-10-08 NOTE — Progress Notes (Signed)
 Subjective:   KRIPA FOSKEY is a 83 y.o. who presents for a Medicare Wellness preventive visit.  Visit Complete: In person    AWV Questionnaire: No: Patient Medicare AWV questionnaire was not completed prior to this visit.  Cardiac Risk Factors include: advanced age (>34men, >72 women);dyslipidemia;hypertension     Objective:    Today's Vitals   10/08/23 0846  BP: 138/70  Pulse: 65  Temp: 97.7 F (36.5 C)  TempSrc: Oral  SpO2: 96%  Weight: 130 lb (59 kg)  Height: 5\' 3"  (1.6 m)   Body mass index is 23.03 kg/m.     10/08/2023    8:53 AM 06/17/2023    7:58 AM 03/14/2022    8:17 AM 03/13/2021    8:25 AM 06/17/2020    8:03 AM 06/08/2020    6:40 AM 03/09/2020   10:41 AM  Advanced Directives  Does Patient Have a Medical Advance Directive? No No No No No No No  Does patient want to make changes to medical advance directive?  No - Patient declined       Would patient like information on creating a medical advance directive?   No - Patient declined Yes (ED - Information included in AVS) No - Patient declined No - Patient declined No - Patient declined    Current Medications (verified) Outpatient Encounter Medications as of 10/08/2023  Medication Sig   alendronate (FOSAMAX) 70 MG tablet TAKE 1 TABLET BY MOUTH EVERY 7 DAYS. TAKE WITH A FULL GLASS OF WATER ON AN EMPTY STOMACH   ALPRAZolam (XANAX) 0.25 MG tablet TAKE 1 TABLET BY MOUTH 2 TIMES DAILY AS NEEDED FOR ANXIETY.   amLODipine (NORVASC) 5 MG tablet Take 1 tablet (5 mg total) by mouth daily. Pt needs to make appt with provider for additional refills - 1st attempt   aspirin EC 81 MG tablet Take 1 tablet (81 mg total) by mouth 2 (two) times daily. (Patient taking differently: Take 81 mg by mouth daily.)   atorvastatin (LIPITOR) 20 MG tablet TAKE 1 TABLET (20 MG TOTAL) BY MOUTH DAILY. PLEASE CALL [phone] TO SCHEDULE AN OVERDUE APPOINTMENT FOR FUTURE REFILLS. THANK YOU. FINAL ATTEMPT.   losartan (COZAAR) 100 MG tablet Take  1 tablet (100 mg total) by mouth daily. Please call [phone] to schedule an overdue appointment for future refills. Thank you. Final attempt.   Melatonin 3 MG CAPS Take 3 mg by mouth at bedtime as needed (sleep).   metoprolol succinate (TOPROL-XL) 100 MG 24 hr tablet TAKE 1 TAB DAILY WITH OR IMMEDIATELY FOLLOWING A MEAL. NEEDS TO SCHEDULE APPT FOR FURTHER REFILLS   Rimegepant Sulfate (NURTEC) 75 MG TBDP Take 1 tablet (75 mg total) by mouth as needed.   verapamil (CALAN-SR) 120 MG CR tablet Take 1 tablet (120 mg total) by mouth at bedtime.   No facility-administered encounter medications on file as of 10/08/2023.    Allergies (verified) Prednisone and Penicillins   History: Past Medical History:  Diagnosis Date   Allergy    Anemia    Ankle fracture, left    Chemotherapy induced cardiomyopathy (HCC)    a. 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 OR;  Service: Neurosurgery;  Laterality: N/A;   APPENDECTOMY     BACK SURGERY     LOWER BACK TWICE   COLONOSCOPY  07/30/2006   ESOPHAGOGASTRODUODENOSCOPY N/A 03/29/2013   Procedure: ESOPHAGOGASTRODUODENOSCOPY (EGD);  Surgeon: Charna Elizabeth, MD;  Location: [facility] ENDOSCOPY;  Service: Endoscopy;  Laterality: N/A;   INGUINAL LYMPH NODE BIOPSY Right 03/31/2013   Procedure: INGUINAL LYMPH NODE BIOPSY;   Surgeon: Cherylynn Ridges, MD;  Location: [facility] OR;  Service: General;  Laterality: Right;   ORIF ANKLE FRACTURE Left 11/03/2015   Procedure: OPEN REDUCTION INTERNAL FIXATION (ORIF) LEFT ANKLE FRACTURE;  Surgeon: Toni Arthurs, MD;  Location: [facility];  Service: Orthopedics;  Laterality: Left;   SKIN BIOPSY Left 02/27/2022   well differenited squamous cell carcinoma   SKIN BIOPSY Right 05/17/2023   Squamous cell carcinoma, keratoacanthoma type   SPINE SURGERY     Family History  Problem Relation Age of Onset   Heart attack Father 36   Leukemia Mother    Diabetes Brother    Diabetes Sister    Social History   Socioeconomic History   Marital status: Married    Spouse name: [name]   Number of children: 2   Years of education: 12   Highest education level: Not on file  Occupational History    Employer: [employer]  Tobacco Use   Smoking status: Never   Smokeless tobacco: Never  Vaping Use   Vaping status: Never Used  Substance and Sexual Activity   Alcohol use: No   Drug use: No   Sexual activity: Yes    Partners: Male  Other Topics Concern   Not on file  Social History Narrative   Married.  Lives in [place] with husband.     Social Drivers of Corporate investment banker Strain: Low Risk  (10/08/2023)   Overall Financial Resource Strain (CARDIA)    Difficulty of Paying Living Expenses: Not hard at all  Food Insecurity: No Food Insecurity (10/08/2023)   Hunger Vital Sign    Worried About Running Out of Food in the Last Year: Never true    Ran Out of Food in the Last Year: Never true  Transportation Needs: No Transportation Needs (10/08/2023)   PRAPARE - Administrator, Civil Service (Medical): No    Lack of Transportation (Non-Medical): No  Physical Activity: Sufficiently Active (10/08/2023)   Exercise Vital Sign    Days of Exercise per Week: 7 days    Minutes of Exercise per Session: 40 min  Stress: No Stress Concern Present (10/08/2023)    [employer] of Occupational Health - Occupational Stress Questionnaire    Feeling of Stress : Not at all  Social Connections: Socially Isolated (10/08/2023)   Social Connection and Isolation Panel [NHANES]    Frequency of Communication with Friends and Family: More than three times a week    Frequency of Social Gatherings with Friends and Family: Three times a week    Attends Religious Services: Never    Active Member of Clubs or Organizations: No    Attends Banker Meetings: Never  Marital Status: Widowed    Tobacco Counseling Counseling given: Not Answered    Clinical Intake:  Pre-visit preparation completed: Yes  Pain : No/denies pain     Nutritional Risks: Nausea/ vomitting/ 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 1  Comment slight  Vision? 0  Difficulty concentrating or making decisions? 0  Walking or climbing stairs? 0  Dressing or bathing? 0  Doing errands, shopping? 0  Preparing Food and eating ? N  Using the Toilet? N  In the past six months, have you accidently leaked urine? N  Do you have problems with loss of bowel control? N  Managing your Medications? N  Managing your Finances? N  Housekeeping or managing your Housekeeping? N    Patient Care Team: Ronnald Nian, MD as PCP - General (Family Medicine) Lennette Bihari, MD as PCP - Cardiology (Cardiology) Verner Chol, [facility] (Inactive) as Pharmacist (Pharmacist) Pa, [facility]  Indicate any recent Medical Services you may have received from other than [facility] providers in the past year (date may be  approximate).     Assessment:   This is a routine wellness examination for Kaylin.  Hearing/Vision screen Hearing Screening - Comments:: Slight hearing issues Vision Screening - Comments:: Regular eye exams, [facility]   Goals Addressed             This Visit's Progress    Patient Stated       10/08/2023, remain independent       Depression Screen     10/08/2023    8:55 AM 03/14/2022    8:16 AM 03/13/2021    8:25 AM 03/09/2020   10:42 AM 01/14/2019    8:52 AM 01/17/2018    1:12 PM 12/27/2016   10:13 AM  PHQ 2/9 Scores  PHQ - 2 Score 0 0 0 0 0 0 0  PHQ- 9 Score 0          Fall Risk     10/08/2023    8:54 AM 09/11/2022    9:32 AM 03/14/2022    8:16 AM 03/13/2021    8:25 AM 03/09/2020   10:41 AM  Fall Risk   Falls in the past year? 0 0 0 0 0  Number falls in past yr: 0 0 0 0   Injury with Fall? 0 0 0 0   Risk for fall due to : Medication side effect No Fall Risks No Fall Risks No Fall Risks No Fall Risks  Follow up Falls prevention discussed;Falls evaluation completed Falls evaluation completed Falls evaluation completed Falls evaluation completed     MEDICARE RISK AT HOME:  Medicare Risk at Home Any stairs in or around the home?: Yes If so, are there any without handrails?: No Home free of loose throw rugs in walkways, pet beds, electrical cords, etc?: Yes Adequate lighting in your home to reduce risk of falls?: Yes Life alert?: No Use of a cane, walker or w/c?: No Grab bars in the bathroom?: No Shower chair or bench in shower?: Yes Elevated toilet seat or a handicapped toilet?: Yes  TIMED UP AND GO:  Was the test performed?  No  Cognitive Function: 6CIT completed        10/08/2023    8:55 AM  6CIT Screen  What Year? 0 points  What month? 0 points  What time? 0 points  Count back from 20 0 points  Months in reverse 0 points  Repeat phrase 4 points  Total Score 4 points    Immunizations Immunization History  Administered Date(s) Administered    Fluad Quad(high Dose 65+) 04/09/2019, 05/09/2021, 05/09/2022   Fluad Trivalent(High Dose 65+) 05/21/2023   Influenza Split 04/12/2011, 04/25/2012   Influenza, High Dose Seasonal PF 04/21/2014, 04/07/2015, 04/24/2016, 04/22/2017, 04/17/2018, 04/17/2020   Influenza,inj,Quad PF,6+ Mos 04/11/2013   PFIZER Comirnaty(Gray Top)Covid-19 Tri-Sucrose Vaccine 03/13/2021, 05/09/2022   PFIZER(Purple Top)SARS-COV-2 Vaccination 09/13/2019, 10/06/2019, 05/02/2020   Pfizer(Comirnaty)Fall Seasonal Vaccine 12 years and older 05/21/2023   Pneumococcal Conjugate-13 12/27/2016   Pneumococcal Polysaccharide-23 04/15/2006, 04/11/2013   Tdap 07/17/2014   Zoster Recombinant(Shingrix) 09/05/2017, 11/23/2017   Zoster, Live 04/15/2006    Screening Tests Health Maintenance  Topic Date Due   Diabetic kidney evaluation - Urine ACR  Never done   COVID-19 Vaccine (7 - 2024-25 season) 07/16/2023   Diabetic kidney evaluation - eGFR measurement  07/02/2024   DTaP/Tdap/Td (2 - Td or Tdap) 07/17/2024   Medicare Annual Wellness (AWV)  10/07/2024   Pneumonia Vaccine 42+ Years old  Completed   INFLUENZA VACCINE  Completed   DEXA SCAN  Completed   Zoster Vaccines- Shingrix  Completed   HPV VACCINES  Aged Out   FOOT EXAM  Discontinued   HEMOGLOBIN A1C  Discontinued   OPHTHALMOLOGY EXAM  Discontinued   Hepatitis C Screening  Discontinued    Health Maintenance  Health Maintenance Due  Topic Date Due   Diabetic kidney evaluation - Urine ACR  Never done   COVID-19 Vaccine (7 - 2024-25 season) 07/16/2023   Health Maintenance Items Addressed: Patient is not diabetic. Up to date with everything  Additional Screening:  Vision Screening: Recommended annual ophthalmology exams for early detection of glaucoma and other disorders of the eye.  Dental Screening: Recommended annual dental exams for proper oral hygiene  Community Resource Referral / Chronic Care Management: CRR required this visit?  No   CCM required this  visit?  No     Plan:     I have personally reviewed and noted the following in the patient's chart:   Medical and social history Use of alcohol, tobacco or illicit drugs  Current medications and supplements including opioid prescriptions. Patient is not currently taking opioid prescriptions. Functional ability and status Nutritional status Physical activity Advanced directives List of other physicians Hospitalizations, surgeries, and ER visits in previous 12 months Vitals Screenings to include cognitive, depression, and falls Referrals and appointments  In addition, I have reviewed and discussed with patient certain preventive protocols, quality metrics, and best practice recommendations. A written personalized care plan for preventive services as well as general preventive health recommendations were provided to patient.     Barb Merino, LPN   1/61/0960   After Visit Summary: (In Person-Printed) AVS printed and given to the patient  Notes: Nothing significant to report at this time.

## 2023-10-14 ENCOUNTER — Other Ambulatory Visit: Payer: Self-pay | Admitting: Adult Health

## 2023-10-14 ENCOUNTER — Other Ambulatory Visit: Payer: Self-pay | Admitting: Family Medicine

## 2023-10-14 ENCOUNTER — Other Ambulatory Visit: Payer: Self-pay | Admitting: Cardiovascular Disease

## 2023-10-14 DIAGNOSIS — M810 Age-related osteoporosis without current pathological fracture: Secondary | ICD-10-CM

## 2023-10-14 DIAGNOSIS — I700 Atherosclerosis of aorta: Secondary | ICD-10-CM

## 2023-10-14 DIAGNOSIS — E785 Hyperlipidemia, unspecified: Secondary | ICD-10-CM

## 2023-10-24 DIAGNOSIS — Z85828 Personal history of other malignant neoplasm of skin: Secondary | ICD-10-CM | POA: Diagnosis not present

## 2023-10-24 DIAGNOSIS — C4442 Squamous cell carcinoma of skin of scalp and neck: Secondary | ICD-10-CM | POA: Diagnosis not present

## 2023-10-24 DIAGNOSIS — L821 Other seborrheic keratosis: Secondary | ICD-10-CM | POA: Diagnosis not present

## 2023-10-24 DIAGNOSIS — L570 Actinic keratosis: Secondary | ICD-10-CM | POA: Diagnosis not present

## 2023-10-24 DIAGNOSIS — L578 Other skin changes due to chronic exposure to nonionizing radiation: Secondary | ICD-10-CM | POA: Diagnosis not present

## 2023-10-24 DIAGNOSIS — D485 Neoplasm of uncertain behavior of skin: Secondary | ICD-10-CM | POA: Diagnosis not present

## 2023-10-24 DIAGNOSIS — L820 Inflamed seborrheic keratosis: Secondary | ICD-10-CM | POA: Diagnosis not present

## 2023-10-28 ENCOUNTER — Other Ambulatory Visit: Payer: Self-pay | Admitting: Family Medicine

## 2023-10-28 ENCOUNTER — Other Ambulatory Visit: Payer: Self-pay | Admitting: Cardiovascular Disease

## 2023-10-28 ENCOUNTER — Other Ambulatory Visit: Payer: Self-pay | Admitting: Adult Health

## 2023-10-28 DIAGNOSIS — I700 Atherosclerosis of aorta: Secondary | ICD-10-CM

## 2023-10-28 DIAGNOSIS — E785 Hyperlipidemia, unspecified: Secondary | ICD-10-CM

## 2023-10-28 DIAGNOSIS — M810 Age-related osteoporosis without current pathological fracture: Secondary | ICD-10-CM

## 2023-10-29 HISTORY — PX: SQUAMOUS CELL CARCINOMA EXCISION: SHX2433

## 2023-10-30 ENCOUNTER — Other Ambulatory Visit: Payer: Self-pay | Admitting: Cardiovascular Disease

## 2023-10-31 ENCOUNTER — Encounter: Payer: Self-pay | Admitting: *Deleted

## 2023-10-31 ENCOUNTER — Other Ambulatory Visit: Payer: Self-pay | Admitting: Family Medicine

## 2023-10-31 NOTE — Telephone Encounter (Signed)
 Copied from CRM [id number]. Topic: Clinical - Medication Refill >> Oct 31, 2023 11:24 AM Emylou G wrote: Most Recent Primary Care Visit:  Provider: Barb Merino  Department: [facility]  Visit Type: MEDICARE AWV, SEQUENTIAL  Date: 10/08/2023  Medication: losartan (COZAAR) 100 MG tablet  Has the patient contacted their pharmacy? Yes (Agent: If no, request that the patient contact the pharmacy for the refill. If patient does not wish to contact the pharmacy document the reason why and proceed with request.) (Agent: If yes, when and what did the pharmacy advise?)  Is this the correct pharmacy for this prescription? Yes If no, delete pharmacy and type the correct one.  This is the patient's preferred pharmacy:  [phone] [place], [place] - [address] [place] [address] Phone: [phone] Fax: [phone]   Has the prescription been filled recently? Yes  Is the patient out of the medication? Yes - has 15 left.. wasn't sure if she needed appt  Has the patient been seen for an appointment in the last year OR does the patient have an upcoming appointment? Yes  Can we respond through MyChart? Yes  Agent: Please be advised that Rx refills may take up to 3 business days. We ask that you follow-up with your pharmacy.

## 2023-11-06 ENCOUNTER — Other Ambulatory Visit: Payer: Self-pay | Admitting: Adult Health

## 2023-11-24 ENCOUNTER — Other Ambulatory Visit: Payer: Self-pay | Admitting: Adult Health

## 2023-11-26 ENCOUNTER — Ambulatory Visit: Payer: Medicare Other | Admitting: Adult Health

## 2023-11-29 ENCOUNTER — Telehealth: Payer: Self-pay | Admitting: Cardiovascular Disease

## 2023-11-29 DIAGNOSIS — I700 Atherosclerosis of aorta: Secondary | ICD-10-CM

## 2023-11-29 DIAGNOSIS — E785 Hyperlipidemia, unspecified: Secondary | ICD-10-CM

## 2023-11-29 MED ORDER — ATORVASTATIN CALCIUM 20 MG PO TABS: 20.0000 mg | ORAL_TABLET | Freq: Every day | ORAL | 1 refills | Status: DC

## 2023-11-29 MED ORDER — METOPROLOL SUCCINATE ER 100 MG PO TB24: 100.0000 mg | ORAL_TABLET | Freq: Every day | ORAL | 1 refills | Status: DC

## 2023-11-29 MED ORDER — AMLODIPINE BESYLATE 5 MG PO TABS: 5.0000 mg | ORAL_TABLET | Freq: Every day | ORAL | 1 refills | Status: DC

## 2023-11-29 NOTE — Telephone Encounter (Signed)
 Pt's medications were sent to pt's pharmacy as requested. Confirmation received.

## 2023-11-29 NOTE — Telephone Encounter (Signed)
*  STAT* If patient is at the pharmacy, call can be transferred to refill team.   1. Which medications need to be refilled? (please list name of each medication and dose if known)   amLODipine  (NORVASC ) 5 MG tablet  atorvastatin  (LIPITOR) 20 MG tablet  metoprolol  succinate (TOPROL -XL) 100 MG 24 hr tablet   2. Would you like to learn more about the convenience, safety, & potential cost savings by using the [facility]?   3. Are you open to using the [facility] (Type [facility]. ).  4. Which pharmacy/location (including street and city if local pharmacy) is medication to be sent to?  [id number] [place], [place] - [address]   5. Do they need a 30 day or 90 day supply?   90 day  Patient stated she is completely out of these medication.

## 2023-12-03 NOTE — Progress Notes (Unsigned)
 [facility] [address] [place]. [place] [address] [phone]       OFFICE FOLLOW UP NOTE  [name] Date of Birth:  May 07, 1941 Medical Record Number:  [id number]    Primary neurologist: Dr. Albertina Hugger Reason for visit: Headaches    SUBJECTIVE:  CHIEF COMPLAINT:  No chief complaint on file.   Follow-up visit:  Prior visit: 03/21/2023  Brief HPI:   Tonya Wise is a 83 y.o. female with PMH of TIA, anxiety, non-Hodgkin's lymphoma s/p chemo 2015, anemia of chronic disease, HTN, cardiomyopathy, chronic systolic and diastolic heart failure and hx of PE who was evaluated by Dr. Albertina Hugger on 01/28/2023 for evaluation of chronic headaches (onset 20+ years ago).  Previously seen by Dr. Festus Hubert in 2015, reported evolution of headaches since then being more localized and bitemporal and retro-orbital with sharp pain type quality followed by dull ache, no photophobia or phonophobia.  MRI brain 02/2023 chronic left thalamic stroke otherwise unremarkable, MRA head 02/2023 unremarkable.   At prior visit, verapamil  increased to 180 mg nightly for prevention and recommended trial of Nurtec for rescue.  She was referred to PT for cervicalgia.  During the interval time, patient called office on 10/30 reporting chest pain 20 to 30 minutes after verapamil  dosage on 10/24.  She returned back to 120mg  dosing without recurrent symptoms.  Interval history:     Patient reports improvement of headaches since prior visit but still present 3-4x per week. Headaches still present bilateral temporal and orbital with pressure/tightness sensation. Denies photophobia, phonophobia or N/V. She continues on verapamil  120mg  nightly, tolerating well. Discontinued amitriptyline  due to side effects. Does note stress can trigger headaches, this has been increased since her husband passed about 1 year ago, she has been needing to make more decisions since then which causes stress. Initially denies  neck pain but upon further discussion, does admit to tightness/stiffness sensation in her neck, denies any radiculopathy.  Blood pressure today slightly elevated although per patient, this is normal despite use of 3 other blood pressure medications. Does occasionally monitor BP at home.  MRI brain showed remote small left thalamic lacunar infarct and mild age-appropriate chronic small vessel disease and generalized cerebral atrophy, no acute abnormalities noted. MRA head no significant abnormalities     ROS:   14 system review of systems performed and negative with exception of those listed in HPI  PMH:  Past Medical History:  Diagnosis Date   Allergy    Anemia    Ankle fracture, left    Chemotherapy induced cardiomyopathy (HCC)    a. 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   Stroke (HCC) 2015   per pt while she was in hospital for Nonhodgkins Lymphoma    PSH:  Past Surgical History:  Procedure Laterality Date   ABDOMINAL HYSTERECTOMY     ANTERIOR LAT LUMBAR FUSION N/A 06/28/2020   Procedure: Lumbar two-three Anterolateral lumbar interbody fusion with lateral plate;  Surgeon: Elna Haggis, MD;  Location: [facility] OR;  Service: Neurosurgery;  Laterality: N/A;   APPENDECTOMY     BACK SURGERY     LOWER BACK TWICE  COLONOSCOPY  07/30/2006   ESOPHAGOGASTRODUODENOSCOPY N/A 03/29/2013   Procedure: ESOPHAGOGASTRODUODENOSCOPY (EGD);  Surgeon: Tami Falcon, MD;  Location: [facility] ENDOSCOPY;  Service: Endoscopy;  Laterality: N/A;   INGUINAL LYMPH NODE BIOPSY Right 03/31/2013   Procedure: INGUINAL LYMPH NODE BIOPSY;  Surgeon: Diantha Fossa, MD;  Location: [facility] OR;  Service: General;   Laterality: Right;   ORIF ANKLE FRACTURE Left 11/03/2015   Procedure: OPEN REDUCTION INTERNAL FIXATION (ORIF) LEFT ANKLE FRACTURE;  Surgeon: Amada Backer, MD;  Location: [facility];  Service: Orthopedics;  Laterality: Left;   SKIN BIOPSY Left 02/27/2022   well differenited squamous cell carcinoma   SKIN BIOPSY Right 05/17/2023   Squamous cell carcinoma, keratoacanthoma type   SPINE SURGERY     SQUAMOUS CELL CARCINOMA EXCISION  10/29/2023   Dr.Jewell    Social History:  Social History   Socioeconomic History   Marital status: Married    Spouse name: [name]   Number of children: 2   Years of education: 12   Highest education level: Not on file  Occupational History    Employer: [employer]  Tobacco Use   Smoking status: Never   Smokeless tobacco: Never  Vaping Use   Vaping status: Never Used  Substance and Sexual Activity   Alcohol use: No   Drug use: No   Sexual activity: Yes    Partners: Male  Other Topics Concern   Not on file  Social History Narrative   Married.  Lives in [place] with husband.     Social Drivers of Corporate investment banker Strain: Low Risk  (10/08/2023)   Overall Financial Resource Strain (CARDIA)    Difficulty of Paying Living Expenses: Not hard at all  Food Insecurity: No Food Insecurity (10/08/2023)   Hunger Vital Sign    Worried About Running Out of Food in the Last Year: Never true    Ran Out of Food in the Last Year: Never true  Transportation Needs: No Transportation Needs (10/08/2023)   PRAPARE - Administrator, Civil Service (Medical): No    Lack of Transportation (Non-Medical): No  Physical Activity: Sufficiently Active (10/08/2023)   Exercise Vital Sign    Days of Exercise per Week: 7 days    Minutes of Exercise per Session: 40 min  Stress: No Stress Concern Present (10/08/2023)   [employer] of Occupational Health - Occupational Stress Questionnaire    Feeling of Stress : Not at all  Social  Connections: Socially Isolated (10/08/2023)   Social Connection and Isolation Panel [NHANES]    Frequency of Communication with Friends and Family: More than three times a week    Frequency of Social Gatherings with Friends and Family: Three times a week    Attends Religious Services: Never    Active Member of Clubs or Organizations: No    Attends Banker Meetings: Never    Marital Status: Widowed  Intimate Partner Violence: Not At Risk (10/08/2023)   Humiliation, Afraid, Rape, and Kick questionnaire    Fear of Current or Ex-Partner: No    Emotionally Abused: No    Physically Abused: No    Sexually Abused: No    Family History:  Family History  Problem Relation Age of Onset   Heart attack Father 27   Leukemia Mother    Diabetes Brother    Diabetes Sister     Medications:   Current Outpatient Medications on File Prior to Visit  Medication Sig Dispense Refill   alendronate  (  FOSAMAX ) 70 MG tablet TAKE 1 TABLET BY MOUTH EVERY 7 DAYS. TAKE WITH A FULL GLASS OF WATER ON AN EMPTY STOMACH 12 tablet 0   ALPRAZolam  (XANAX ) 0.25 MG tablet TAKE 1 TABLET BY MOUTH 2 TIMES DAILY AS NEEDED FOR ANXIETY. 30 tablet 0   amLODipine  (NORVASC ) 5 MG tablet Take 1 tablet (5 mg total) by mouth daily. 30 tablet 1   aspirin  EC 81 MG tablet Take 1 tablet (81 mg total) by mouth 2 (two) times daily. (Patient taking differently: Take 81 mg by mouth daily.) 90 tablet 0   atorvastatin  (LIPITOR) 20 MG tablet Take 1 tablet (20 mg total) by mouth daily. 30 tablet 1   losartan  (COZAAR ) 100 MG tablet TAKE 1 TABLET BY MOUTH DAILY PLEASE CALL [phone] TO SCHEDULE AN OVERDUE APPOINTMENT FOR REFILLS 30 tablet 0   Melatonin 3 MG CAPS Take 3 mg by mouth at bedtime as needed (sleep).     metoprolol  succinate (TOPROL -XL) 100 MG 24 hr tablet Take 1 tablet (100 mg total) by mouth daily. Take with or immediately following a meal. 30 tablet 1   Rimegepant Sulfate (NURTEC) 75 MG TBDP Take 1 tablet (75 mg total) by  mouth as needed. 16 tablet 11   verapamil  (CALAN -SR) 120 MG CR tablet TAKE 1 TABLET BY MOUTH AT BEDTIME. 90 tablet 1   No current facility-administered medications on file prior to visit.    Allergies:   Allergies  Allergen Reactions   Prednisone  Other (See Comments)    "like being in a coma"   Penicillins Itching, Rash and Other (See Comments)    "burning sensation" Has patient had a PCN reaction causing immediate rash, facial/tongue/throat swelling, SOB or lightheadedness with hypotension: No Has patient had a PCN reaction causing severe rash involving mucus membranes or skin necrosis: No Has patient had a PCN reaction that required hospitalization No Has patient had a PCN reaction occurring within the last 10 years: No If all of the above answers are "NO", then may proceed with Cephalosporin use.      OBJECTIVE:  Physical Exam  There were no vitals filed for this visit.  There is no height or weight on file to calculate BMI. No results found.   General: well developed, well nourished, very pleasant elderly Caucasian female, seated, in no evident distress Head: head normocephalic and atraumatic.   Neck: supple with no carotid or supraclavicular bruits Cardiovascular: regular rate and rhythm, no murmurs Musculoskeletal: Pressure sensitivity and tightness over bilateral upper traps and SCM, some limited cervical ROM   Neurologic Exam Mental Status: Awake and fully alert. Oriented to place and time. Recent and remote memory intact. Attention span, concentration and fund of knowledge appropriate. Mood and affect appropriate.  Cranial Nerves: Pupils equal, briskly reactive to light. Extraocular movements full without nystagmus. Visual fields full to confrontation. Hearing intact. Facial sensation intact. Face, tongue, palate moves normally and symmetrically.  Motor: Normal bulk and tone. Normal strength in all tested extremity muscles Sensory.: intact to touch , pinprick ,  position and vibratory sensation.  Coordination: Rapid alternating movements normal in all extremities. Finger-to-nose and heel-to-shin performed accurately bilaterally. Gait and Station: Arises from chair without difficulty. Stance is normal. Gait demonstrates normal stride length and balance without use of AD. Reflexes: 1+ and symmetric. Toes downgoing.         ASSESSMENT/PLAN: Tonya Wise is a 83 y.o. year old female with longstanding history of migraine headaches worsened over the past year after the passing  of her husband. Most recent headaches consistent with tension type headaches, does have cervicalgia possibly contributing. MRI brain and MRA head unremarkable for contributing factors. Does have hx of TIA and evidence of chronic left thalamic infarct on imaging.     Chronic headaches, tension type:  Increase verapamil  to 240mg  nightly Try Nurtec 75mg  daily as needed for headache rescue (triptans contraindicated d/t stroke history) Referral placed to PT for cervicalgia MRI brain 02/2023 chronic left thalamic stroke, no findings contributing to headaches MRA head 02/2023 unremarkable Intolerant to amitriptyline      Follow up in 6 months or call earlier if needed   CC:  PCP: Watson Hacking, MD    I spent 30 minutes of face-to-face and non-face-to-face time with patient.  This included previsit chart review, lab review, study review, order entry, electronic health record documentation, patient education and discussion regarding above diagnoses and treatment plan and answered all other questions to patient's satisfaction  Johny Nap, [facility]  [facility] [address] [place], [place] [address]  Phone [phone] Fax [phone] Note: This document was prepared with digital dictation and possible smart phrase technology. Any transcriptional errors that result from this process are unintentional.

## 2023-12-04 ENCOUNTER — Ambulatory Visit (INDEPENDENT_AMBULATORY_CARE_PROVIDER_SITE_OTHER): Admitting: Adult Health

## 2023-12-04 ENCOUNTER — Other Ambulatory Visit: Payer: Self-pay | Admitting: Cardiovascular Disease

## 2023-12-04 ENCOUNTER — Encounter: Payer: Self-pay | Admitting: Adult Health

## 2023-12-04 VITALS — BP 127/69 | HR 58 | Ht 62.0 in | Wt 128.0 lb

## 2023-12-04 DIAGNOSIS — G44221 Chronic tension-type headache, intractable: Secondary | ICD-10-CM

## 2023-12-04 MED ORDER — TOPIRAMATE 25 MG PO TABS: 25.0000 mg | ORAL_TABLET | Freq: Two times a day (BID) | ORAL | 11 refills | Status: DC

## 2023-12-04 NOTE — Patient Instructions (Addendum)
 Your Plan:  Start topamax 25mg  twice daily - please let me know after 2-3 weeks if no improvement for dosage increase or sooner with any difficulty tolerating   Continue tylenol  as needed      Follow up in 6 months or call earlier if needed     Thank you for coming to see us  at [facility]. I hope we have been able to provide you high quality care today.  You may receive a patient satisfaction survey over the next few weeks. We would appreciate your feedback and comments so that we may continue to improve ourselves and the health of our patients.     Topiramate Tablets What is this medication? TOPIRAMATE (toe PYRE a mate) prevents and controls seizures in people with epilepsy. It may also be used to prevent migraine headaches. It works by calming overactive nerves in your body. This medicine may be used for other purposes; ask your health care provider or pharmacist if you have questions. COMMON BRAND NAME(S): Topamax, Topiragen What should I tell my care team before I take this medication? They need to know if you have any of these conditions: Bleeding disorder Kidney disease Lung disease Suicidal thoughts, plans, or attempt by you or a family member An unusual or allergic reaction to topiramate, other medications, foods, dyes, or preservatives Pregnant or trying to get pregnant Breast-feeding How should I use this medication? Take this medication by mouth with water. Take it as directed on the prescription label at the same time every day. Do not cut, crush or chew this medicine. Swallow the tablets whole. You can take it with or without food. If it upsets your stomach, take it with food. Keep taking it unless your care team tells you to stop. A special MedGuide will be given to you by the pharmacist with each prescription and refill. Be sure to read this information carefully each time. Talk to your care team about the use of this medication in children.  While it may be prescribed for children as young as 2 years for selected conditions, precautions do apply. Overdosage: If you think you have taken too much of this medicine contact a poison control center or emergency room at once. NOTE: This medicine is only for you. Do not share this medicine with others. What if I miss a dose? If you miss a dose, take it as soon as you can unless it is within 6 hours of the next dose. If it is within 6 hours of the next dose, skip the missed dose. Take the next dose at the normal time. Do not take double or extra doses. What may interact with this medication? Acetazolamide Alcohol Antihistamines for allergy, cough, and cold Aspirin  and aspirin -like medications Atropine Certain medications for anxiety or sleep Certain medications for bladder problems, such as oxybutynin, tolterodine Certain medications for depression, such as amitriptyline , fluoxetine, sertraline Certain medications for Parkinson disease, such as benztropine, trihexyphenidyl Certain medications for seizures, such as carbamazepine, lamotrigine, phenobarbital, phenytoin, primidone, valproic acid, zonisamide Certain medications for stomach problems, such as dicyclomine, hyoscyamine Certain medications for travel sickness, such as scopolamine  Certain medications that treat or prevent blood clots, such as warfarin, enoxaparin , dalteparin, apixaban, dabigatran, rivaroxaban Digoxin Diltiazem Estrogen and progestin hormones General anesthetics, such as halothane, isoflurane, methoxyflurane, propofol  Glyburide Hydrochlorothiazide  Ipratropium Lithium Medications that relax muscles Metformin NSAIDs, medications for pain and inflammation, such as ibuprofen or naproxen Opioid medications for pain Phenothiazines, such as chlorpromazine, mesoridazine, prochlorperazine, thioridazine Pioglitazone This list  may not describe all possible interactions. Give your health care provider a list of all the  medicines, herbs, non-prescription drugs, or dietary supplements you use. Also tell them if you smoke, drink alcohol, or use illegal drugs. Some items may interact with your medicine. What should I watch for while using this medication? Visit your care team for regular checks on your progress. Tell your care team if your symptoms do not start to get better or if they get worse. Do not suddenly stop taking this medication. You may develop a severe reaction. Your care team will tell you how much medication to take. If your care team wants you to stop the medication, the dose may be slowly lowered over time to avoid any side effects. Wear a medical ID bracelet or chain. Carry a card that describes your condition. List the medications and doses you take on the card. This medication may affect your coordination, reaction time, or judgment. Do not drive or operate machinery until you know how this medication affects you. Sit up or stand slowly to reduce the risk of dizzy or fainting spells. Drinking alcohol with this medication can increase the risk of these side effects. This medication may cause serious skin reactions. They can happen weeks to months after starting the medication. Contact your care team right away if you notice fevers or flu-like symptoms with a rash. The rash may be red or purple and then turn into blisters or peeling of the skin. You may also notice a red rash with swelling of the face, lips, or lymph nodes in your neck or under your arms. This medication may cause thoughts of suicide or depression. This includes sudden changes in mood, behaviors, or thoughts. These changes can happen at any time but are more common in the beginning of treatment or after a change in dose. Call your care team right away if you experience these thoughts or worsening depression. This medication may slow your child's growth if it is taken for a long time at high doses. Your child's care team will monitor your  child's growth. Using this medication for a long time may weaken your bones. The risk of bone fractures may be increased. Talk to your care team about your bone health. Discuss this medication with your care team if you may be pregnant. Serious birth defects can occur if you take this medication during pregnancy. There are benefits and risks to taking medications during pregnancy. Your care team can help you find the option that works for you. Contraception is recommended while taking this medication. Estrogen and progestin hormones may not work as well while you are taking this medication. Your care team can help you find the option that works for you. Talk to your care team before breastfeeding. Changes to your treatment plan may be needed. What side effects may I notice from receiving this medication? Side effects that you should report to your care team as soon as possible: Allergic reactions--skin rash, itching, hives, swelling of the face, lips, tongue, or throat High acid level--trouble breathing, unusual weakness or fatigue, confusion, headache, fast or irregular heartbeat, nausea, vomiting High ammonia level--unusual weakness or fatigue, confusion, loss of appetite, nausea, vomiting, seizures Fever that does not go away, decrease in sweat Kidney stones--blood in the urine, pain or trouble passing urine, pain in the lower back or sides Redness, blistering, peeling or loosening of the skin, including inside the mouth Sudden eye pain or change in vision such as blurry vision, seeing  halos around lights, vision loss Thoughts of suicide or self-harm, worsening mood, feelings of depression Side effects that usually do not require medical attention (report to your care team if they continue or are bothersome): Burning or tingling sensation in hands or feet Difficulty with paying attention, memory, or speech Dizziness Drowsiness Fatigue Loss of appetite with weight loss Slow or sluggish  movements of the body This list may not describe all possible side effects. Call your doctor for medical advice about side effects. You may report side effects to FDA at 1-800-FDA-1088. Where should I keep my medication? Keep out of the reach of children and pets. Store between 15 and 30 degrees C (59 and 86 degrees F). Protect from moisture. Keep the container tightly closed. Get rid of any unused medication after the expiration date. To get rid of medications that are no longer needed or have expired: Take the medication to a medication take-back program. Check with your pharmacy or law enforcement to find a location. If you cannot return the medication, check the label or package insert to see if the medication should be thrown out in the garbage or flushed down the toilet. If you are not sure, ask your care team. If it is safe to put it in the trash, empty the medication out of the container. Mix the medication with cat litter, dirt, coffee grounds, or other unwanted substance. Seal the mixture in a bag or container. Put it in the trash. NOTE: This sheet is a summary. It may not cover all possible information. If you have questions about this medicine, talk to your doctor, pharmacist, or health care provider.  2024 Elsevier/Gold Standard (2021-12-07 00:00:00)

## 2023-12-06 ENCOUNTER — Telehealth: Payer: Self-pay | Admitting: Adult Health

## 2023-12-06 NOTE — Telephone Encounter (Signed)
 R/s due to conflict

## 2023-12-31 ENCOUNTER — Other Ambulatory Visit: Payer: Self-pay | Admitting: Cardiovascular Disease

## 2024-01-02 NOTE — Telephone Encounter (Signed)
 Copied from CRM [id number]. Topic: Clinical - Medical Advice >> Jan 02, 2024 11:50 AM Rosaria Common wrote: Reason for CRM: Pt calling to verify if she needs a shingles shot or not. Callback number is  [phone].

## 2024-01-04 ENCOUNTER — Other Ambulatory Visit: Payer: Self-pay | Admitting: Family Medicine

## 2024-01-04 DIAGNOSIS — M810 Age-related osteoporosis without current pathological fracture: Secondary | ICD-10-CM

## 2024-01-16 ENCOUNTER — Encounter: Payer: Self-pay | Admitting: Cardiovascular Disease

## 2024-01-16 ENCOUNTER — Ambulatory Visit: Admitting: Cardiovascular Disease

## 2024-01-16 DIAGNOSIS — I700 Atherosclerosis of aorta: Secondary | ICD-10-CM

## 2024-01-16 DIAGNOSIS — I10 Essential (primary) hypertension: Secondary | ICD-10-CM

## 2024-01-16 DIAGNOSIS — E785 Hyperlipidemia, unspecified: Secondary | ICD-10-CM

## 2024-01-16 NOTE — Progress Notes (Signed)
 Order(s) created erroneously. Erroneous order ID: [id number]  Order moved by: CHART CORRECTION ANALYST SEVEN, IDENTITY  Order move date/time: 01/16/2024 3:46 PM  Source Patient: [name]  Source Contact: 01/16/2024  Destination Patient: [name]  Destination Contact: 01/09/2023

## 2024-01-16 NOTE — Progress Notes (Signed)
 Pt left without being seen.

## 2024-01-20 ENCOUNTER — Other Ambulatory Visit: Payer: Self-pay

## 2024-01-20 DIAGNOSIS — I700 Atherosclerosis of aorta: Secondary | ICD-10-CM

## 2024-01-20 DIAGNOSIS — E785 Hyperlipidemia, unspecified: Secondary | ICD-10-CM

## 2024-01-20 MED ORDER — ATORVASTATIN CALCIUM 20 MG PO TABS: 20.0000 mg | ORAL_TABLET | Freq: Every day | ORAL | 3 refills | Status: AC

## 2024-01-20 NOTE — Progress Notes (Signed)
 Cardiology Office Note:    Date:  01/24/2024   ID:  Tonya Wise, DOB 04-11-1941, MRN [id number]  PCP:  Joyce Norleen BROCKS, MD    [facility] Providers Cardiologist:  Peter Swaziland, MD Cardiology APP:  Madie Jon Garre, [place]     Referring MD: Swaziland, Peter M, MD   Chief Complaint  Patient presents with   Follow-up    HTN    History of Present Illness:    Tonya Wise is a 83 y.o. female with a hx of TIA 2015, headache, anxiety, non-hodgkin's lymphoma s/p chemo 2015, anemia of chronic disease, HTN, chronic systolic and diastolic heart failure, CKD 3b, HLD, and hx of PE no longer on OAC.  Low risk NM 2014. 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   Last seen 2023. BP controlled with 5 mg amlodipine . She remained on 20 mg lipitor. She presents for routine follow up and medication refills.  She now started walking 3 miles daily. She does her own yard work on 1 acre, including push mow and [employer]. No exertional symptoms.  EKG today with bradycardia, but she feels well. No fatigue. She is managing migraines with verapamil , but also on amlodipine  for HTN.  Past Medical History:  Diagnosis Date   Allergy    Anemia    Ankle fracture, left    Chemotherapy induced cardiomyopathy (HCC)    a. 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   Stroke (HCC) 2015   per pt while she was in hospital for Nonhodgkins Lymphoma    Past Surgical History:  Procedure Laterality Date   ABDOMINAL HYSTERECTOMY     ANTERIOR LAT LUMBAR FUSION N/A 06/28/2020   Procedure: Lumbar two-three Anterolateral lumbar interbody fusion with  lateral plate;  Surgeon: Colon Shove, MD;  Location: [facility] OR;  Service: Neurosurgery;  Laterality: N/A;   APPENDECTOMY     BACK SURGERY     LOWER BACK TWICE   COLONOSCOPY  07/30/2006   ESOPHAGOGASTRODUODENOSCOPY N/A 03/29/2013   Procedure: ESOPHAGOGASTRODUODENOSCOPY (EGD);  Surgeon: Renaye Sous, MD;  [facility];  Service: Endoscopy;  Laterality: N/A;   INGUINAL LYMPH NODE BIOPSY Right 03/31/2013   Procedure: INGUINAL LYMPH NODE BIOPSY;  Surgeon: Lynwood MALVA Pina, MD;  Location: [facility] OR;  Service: General;  Laterality: Right;   ORIF ANKLE FRACTURE Left 11/03/2015   Procedure: OPEN REDUCTION INTERNAL FIXATION (ORIF) LEFT ANKLE FRACTURE;  Surgeon: Norleen Armor, MD;  Location: [facility];  Service: Orthopedics;  Laterality: Left;   SKIN BIOPSY Left 02/27/2022   well differenited squamous cell carcinoma   SKIN BIOPSY Right 05/17/2023   Squamous cell carcinoma, keratoacanthoma type   SPINE SURGERY     SQUAMOUS CELL CARCINOMA EXCISION  10/29/2023   Dr.Jewell    Current Medications: Current Meds  Medication Sig   alendronate  (FOSAMAX ) 70 MG tablet TAKE 1 TABLET BY MOUTH EVERY 7 DAYS. TAKE WITH A FULL GLASS OF WATER ON AN EMPTY STOMACH   ALPRAZolam  (XANAX ) 0.25 MG tablet TAKE 1 TABLET BY MOUTH 2 TIMES DAILY AS NEEDED FOR ANXIETY.  aspirin  EC 81 MG tablet Take 1 tablet (81 mg total) by mouth 2 (two) times daily.   atorvastatin  (LIPITOR) 20 MG tablet Take 1 tablet (20 mg total) by mouth daily.   Melatonin 3 MG CAPS Take 3 mg by mouth at bedtime as needed (sleep).   metoprolol  succinate (TOPROL -XL) 100 MG 24 hr tablet Take 1 tablet (100 mg total) by mouth daily. Take with or immediately following a meal.   olmesartan (BENICAR) 40 MG tablet Take 1 tablet (40 mg total) by mouth daily.   topiramate  (TOPAMAX ) 25 MG tablet Take 1 tablet (25 mg total) by mouth 2 (two) times daily.   verapamil  (CALAN -SR) 120 MG CR tablet TAKE 1 TABLET BY MOUTH AT BEDTIME.   VEVYE  0.1 % SOLN Apply 1  drop to eye 2 (two) times daily.   [DISCONTINUED] amLODipine  (NORVASC ) 5 MG tablet Take 1 tablet (5 mg total) by mouth daily.   [DISCONTINUED] losartan  (COZAAR ) 100 MG tablet TAKE 1 TABLET BY MOUTH DAILY PLEASE CALL [phone] TO SCHEDULE AN OVERDUE APPOINTMENT FOR REFILLS     Allergies:   Prednisone  and Penicillins   Social History   Socioeconomic History   Marital status: Married    Spouse name: [name]   Number of children: 2   Years of education: 12   Highest education level: Not on file  Occupational History    Employer: [employer]  Tobacco Use   Smoking status: Never   Smokeless tobacco: Never  Vaping Use   Vaping status: Never Used  Substance and Sexual Activity   Alcohol use: No   Drug use: No   Sexual activity: Yes    Partners: Male  Other Topics Concern   Not on file  Social History Narrative   Married.  Lives in [place] with husband.        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/08/2023)   Overall Financial Resource Strain (CARDIA)    Difficulty of Paying Living Expenses: Not hard at all  Food Insecurity: No Food Insecurity (10/08/2023)   Hunger Vital Sign    Worried About Running Out of Food in the Last Year: Never true    Ran Out of Food in the Last Year: Never true  Transportation Needs: No Transportation Needs (10/08/2023)   PRAPARE - Administrator, Civil Service (Medical): No    Lack of Transportation (Non-Medical): No  Physical Activity: Sufficiently Active (10/08/2023)   Exercise Vital Sign    Days of Exercise per Week: 7 days    Minutes of Exercise per Session: 40 min  Stress: No Stress Concern Present (10/08/2023)   [employer] of Occupational Health - Occupational Stress Questionnaire    Feeling of Stress : Not at all  Social Connections: Socially Isolated (10/08/2023)   Social Connection and Isolation Panel    Frequency of Communication with Friends and Family: More than three times  a week    Frequency of Social Gatherings with Friends and Family: Three times a week    Attends Religious Services: Never    Active Member of Clubs or Organizations: No    Attends Banker Meetings: Never    Marital Status: Widowed     Family History: The patient's family history includes Diabetes in her brother and sister; Heart attack (age of onset: 27) in her father; Leukemia in her mother. There is no history of Migraines.  ROS:   Please see the history  of present illness.     All other systems reviewed and are negative.  EKGs/Labs/Other Studies Reviewed:    The following studies were reviewed today:  EKG Interpretation Date/Time:  Friday January 24 2024 08:47:02 EDT Ventricular Rate:  52 PR Interval:  156 QRS Duration:  96 QT Interval:  460 QTC Calculation: 427 R Axis:   21  Text Interpretation: Sinus bradycardia When compared with ECG of 16-Jan-2024 09:55, Nonspecific T wave abnormality no longer evident in Anterolateral leads Confirmed by Madie Slough ([id number]) on 01/24/2024 8:56:40 AM    Recent Labs: 07/03/2023: ALT 13; BUN 11; Creatinine 1.09; Hemoglobin 12.8; Platelet Count 134; Potassium 3.4; Sodium 142  Recent Lipid Panel    Component Value Date/Time   CHOL 177 12/05/2020 0949   TRIG 183 (H) 12/05/2020 0949   HDL 56 12/05/2020 0949   CHOLHDL 3.2 12/05/2020 0949   CHOLHDL 3.4 12/27/2016 1029   VLDL 28 12/27/2016 1029   LDLCALC 90 12/05/2020 0949     Risk Assessment/Calculations:                Physical Exam:    VS:  BP 130/62 (BP Location: Left Arm, Patient Position: Sitting, Cuff Size: Normal)   Pulse (!) 52   Wt 126 lb 3.2 oz (57.2 kg)   SpO2 99%   BMI 23.08 kg/m     Wt Readings from Last 3 Encounters:  01/24/24 126 lb 3.2 oz (57.2 kg)  12/04/23 128 lb (58.1 kg)  10/08/23 130 lb (59 kg)     GEN:  Well nourished, well developed in no acute distress HEENT: Normal NECK: No JVD; No carotid bruits LYMPHATICS: No  lymphadenopathy CARDIAC: RRR, no murmurs, rubs, gallops RESPIRATORY:  Clear to auscultation without rales, wheezing or rhonchi  ABDOMEN: Soft, non-tender, non-distended MUSCULOSKELETAL:  No edema; No deformity  SKIN: Warm and dry NEUROLOGIC:  Alert and oriented x 3 PSYCHIATRIC:  Normal affect   ASSESSMENT:    1. Aortic atherosclerosis (HCC)   2. Hyperlipidemia, unspecified hyperlipidemia type   3. Essential hypertension   4. History of B-cell lymphoma   5. Chronic combined systolic and diastolic heart failure (HCC)   6. Chemotherapy induced cardiomyopathy (HCC)   7. Stage 3b chronic kidney disease (HCC)    PLAN:    In order of problems listed above:  Hypertension - currently on 5 mg amlodipine , 100 mg losartan , and 100 mg toproll - verapamil  for headache prevention - BP controlled, bradycardia symptomatic - I will stop amlodipine , change losartan  to 40 mg olmesartan, and keep toprol  for now - follow up in 1 month with BP log and BMP at that time - may ultimately need to change toprol  to coreg for better BP control and less bradycardia   Hyperlipidemia  Labs by PCP Continue lipitor 20 mg   Prior cardiomyopathy - felt induced by chemotherapy - last echo 2021 that showed normalization of LVEF 55-60% - reassuring NM 2014   Sinus bradycardia - on 100 mg toprol  - asymptomatic - will continue this for now as she is asymptomatic   Follow up in 4 weeks.   She is a previous Dr. Burnard patient. I will switch  her to Dr. Swaziland as DOD and patient request. I will request follow up with Dr. Swaziland in 6 months.       Medication Adjustments/Labs and Tests Ordered: Current medicines are reviewed at length with the patient today.  Concerns regarding medicines are outlined above.  Orders Placed This Encounter  Procedures  EKG 12-Lead   Meds ordered this encounter  Medications   olmesartan (BENICAR) 40 MG tablet    Sig: Take 1 tablet (40 mg total) by mouth daily.     Dispense:  90 tablet    Refill:  3    Patient Instructions  Medication Instructions: STOP Amlodipine  5mg .  STOP Losartan  100mg .  START Olmesartan 40mg . Take one tablet daily.  Check your blood pressure once daily, 2 hours after you've taken you medication.  *If you need a refill on your cardiac medications before your next appointment, please call your pharmacy*   Lab Work: No labs were ordered during today's visit.   If you have labs (blood work) drawn today and your tests are completely normal, you will receive your results only by: MyChart Message (if you have MyChart) OR A paper copy in the mail If you have any lab test that is abnormal or we need to change your treatment, we will call you to review the results.   Testing/Procedures: No procedures were ordered during today's visit.    Follow-Up: At [facility], you and your health needs are our priority.  As part of our continuing mission to provide you with exceptional heart care, we have created designated Provider Care Teams.  These Care Teams include your primary Cardiologist (physician) and Advanced Practice Providers (APPs -  Physician Assistants and Nurse Practitioners) who all work together to provide you with the care you need, when you need it.  We recommend signing up for the patient portal called MyChart.  Sign up information is provided on this After Visit Summary.  MyChart is used to connect with patients for Virtual Visits (Telemedicine).  Patients are able to view lab/test results, encounter notes, upcoming appointments, etc.  Non-urgent messages can be sent to your provider as well.   To learn more about what you can do with MyChart, go to [email or web address].    Your next appointment:   6 month(s) establish care  Provider:   Peter Swaziland, MD    Other Instructions Thank you for choosing [facility]!       Signed, Jon Nat Hails, [place]  01/24/2024 9:26 AM    [facility]

## 2024-01-23 DIAGNOSIS — H04123 Dry eye syndrome of bilateral lacrimal glands: Secondary | ICD-10-CM | POA: Diagnosis not present

## 2024-01-23 DIAGNOSIS — H26492 Other secondary cataract, left eye: Secondary | ICD-10-CM | POA: Diagnosis not present

## 2024-01-23 DIAGNOSIS — H35033 Hypertensive retinopathy, bilateral: Secondary | ICD-10-CM | POA: Diagnosis not present

## 2024-01-24 ENCOUNTER — Other Ambulatory Visit: Payer: Self-pay | Admitting: Cardiovascular Disease

## 2024-01-24 ENCOUNTER — Ambulatory Visit: Attending: Physician Assistant | Admitting: Physician Assistant

## 2024-01-24 ENCOUNTER — Encounter: Payer: Self-pay | Admitting: Physician Assistant

## 2024-01-24 VITALS — BP 130/62 | HR 52 | Wt 126.2 lb

## 2024-01-24 DIAGNOSIS — N1832 Chronic kidney disease, stage 3b: Secondary | ICD-10-CM | POA: Diagnosis not present

## 2024-01-24 DIAGNOSIS — T451X5D Adverse effect of antineoplastic and immunosuppressive drugs, subsequent encounter: Secondary | ICD-10-CM | POA: Diagnosis not present

## 2024-01-24 DIAGNOSIS — E785 Hyperlipidemia, unspecified: Secondary | ICD-10-CM | POA: Insufficient documentation

## 2024-01-24 DIAGNOSIS — Z8572 Personal history of non-Hodgkin lymphomas: Secondary | ICD-10-CM | POA: Insufficient documentation

## 2024-01-24 DIAGNOSIS — I5042 Chronic combined systolic (congestive) and diastolic (congestive) heart failure: Secondary | ICD-10-CM | POA: Insufficient documentation

## 2024-01-24 DIAGNOSIS — I10 Essential (primary) hypertension: Secondary | ICD-10-CM | POA: Diagnosis not present

## 2024-01-24 DIAGNOSIS — I700 Atherosclerosis of aorta: Secondary | ICD-10-CM | POA: Diagnosis not present

## 2024-01-24 DIAGNOSIS — I427 Cardiomyopathy due to drug and external agent: Secondary | ICD-10-CM | POA: Insufficient documentation

## 2024-01-24 DIAGNOSIS — T451X5A Adverse effect of antineoplastic and immunosuppressive drugs, initial encounter: Secondary | ICD-10-CM | POA: Insufficient documentation

## 2024-01-24 MED ORDER — OLMESARTAN MEDOXOMIL 40 MG PO TABS: 40.0000 mg | ORAL_TABLET | Freq: Every day | ORAL | 3 refills | Status: DC

## 2024-01-24 NOTE — Patient Instructions (Addendum)
 Medication Instructions: STOP Amlodipine  5mg .  STOP Losartan  100mg .  START Olmesartan 40mg . Take one tablet daily.  Check your blood pressure once daily, 2 hours after you've taken you medication.  *If you need a refill on your cardiac medications before your next appointment, please call your pharmacy*   Lab Work: No labs were ordered during today's visit.   If you have labs (blood work) drawn today and your tests are completely normal, you will receive your results only by: MyChart Message (if you have MyChart) OR A paper copy in the mail If you have any lab test that is abnormal or we need to change your treatment, we will call you to review the results.   Testing/Procedures: No procedures were ordered during today's visit.    Follow-Up: At [facility], you and your health needs are our priority.  As part of our continuing mission to provide you with exceptional heart care, we have created designated Provider Care Teams.  These Care Teams include your primary Cardiologist (physician) and Advanced Practice Providers (APPs -  Physician Assistants and Nurse Practitioners) who all work together to provide you with the care you need, when you need it.  We recommend signing up for the patient portal called MyChart.  Sign up information is provided on this After Visit Summary.  MyChart is used to connect with patients for Virtual Visits (Telemedicine).  Patients are able to view lab/test results, encounter notes, upcoming appointments, etc.  Non-urgent messages can be sent to your provider as well.   To learn more about what you can do with MyChart, go to [email or web address].    Your next appointment:   6 month(s) establish care  Provider:   Peter Swaziland, MD    Other Instructions Thank you for choosing [facility]!

## 2024-02-10 NOTE — Progress Notes (Signed)
 Cardiology Office Note:    Date:  02/18/2024   ID:  Tonya Wise, DOB Nov 29, 1940, MRN [id number]  PCP:  Joyce Norleen BROCKS, MD   [facility] Providers Cardiologist:  Peter Swaziland, MD Cardiology APP:  Madie Jon Garre, [place]     Referring MD: Joyce Norleen BROCKS, MD   Chief Complaint  Patient presents with   Follow-up    HTN    History of Present Illness:    Tonya Wise is a 83 y.o. female with a hx of TIA 2015, headache, anxiety, non-hodgkin's lymphoma s/p chemo 2015, anemia of chronic disease, HTN, chronic systolic and diastolic heart failure, CKD 3b, HLD, and hx of PE no longer on OAC.  Low risk NM 2014. 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   BP controlled with 5 mg amlodipine . She remained on 20 mg lipitor. I saw her one month ago for refills - she was walking 3 miles daily and doing her own yard work on 1 acre. She was on amlodipine  and verapamil  for migraines. I stopped amlodipine  and changed losartan  to 40 mg olmesartan .   She present for follow up after these med changes. BP running 130s range systolic. She struggles with chronic migraine, no new neurological issues.   She has not taken medications yet this morning, BP elevated this morning.  She has been eating salty snacks when she can't get outside when its raining.    Past Medical History:  Diagnosis Date   Allergy    Anemia    Ankle fracture, left    Chemotherapy induced cardiomyopathy (HCC)    a. 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   Stroke (HCC) 2015   per pt while she was in hospital for Nonhodgkins Lymphoma    Past Surgical  History:  Procedure Laterality Date   ABDOMINAL HYSTERECTOMY     ANTERIOR LAT LUMBAR FUSION N/A 06/28/2020   Procedure: Lumbar two-three Anterolateral lumbar interbody fusion with lateral plate;  Surgeon: Colon Shove, MD;  Location: [facility] OR;  Service: Neurosurgery;  Laterality: N/A;   APPENDECTOMY     BACK SURGERY     LOWER BACK TWICE   COLONOSCOPY  07/30/2006   ESOPHAGOGASTRODUODENOSCOPY N/A 03/29/2013   Procedure: ESOPHAGOGASTRODUODENOSCOPY (EGD);  Surgeon: Renaye Sous, MD;  Location: [facility] ENDOSCOPY;  Service: Endoscopy;  Laterality: N/A;   INGUINAL LYMPH NODE BIOPSY Right 03/31/2013   Procedure: INGUINAL LYMPH NODE BIOPSY;  Surgeon: Lynwood MALVA Pina, MD;  Location: [facility] OR;  Service: General;  Laterality: Right;   ORIF ANKLE FRACTURE Left 11/03/2015   Procedure: OPEN REDUCTION INTERNAL FIXATION (ORIF) LEFT ANKLE FRACTURE;  Surgeon: Norleen Armor, MD;  Location: [facility];  Service: Orthopedics;  Laterality: Left;   SKIN BIOPSY Left 02/27/2022   well differenited squamous cell carcinoma   SKIN BIOPSY Right 05/17/2023   Squamous cell carcinoma, keratoacanthoma type   SPINE SURGERY     SQUAMOUS CELL CARCINOMA EXCISION  10/29/2023   Dr.Jewell    Current Medications: Current Meds  Medication Sig   alendronate  (FOSAMAX ) 70 MG tablet TAKE 1 TABLET  BY MOUTH EVERY 7 DAYS. TAKE WITH A FULL GLASS OF WATER ON AN EMPTY STOMACH   ALPRAZolam  (XANAX ) 0.25 MG tablet TAKE 1 TABLET BY MOUTH 2 TIMES DAILY AS NEEDED FOR ANXIETY.   aspirin  EC 81 MG tablet Take 1 tablet (81 mg total) by mouth 2 (two) times daily.   atorvastatin  (LIPITOR) 20 MG tablet Take 1 tablet (20 mg total) by mouth daily.   Melatonin 3 MG CAPS Take 3 mg by mouth at bedtime as needed (sleep).   metoprolol  succinate (TOPROL -XL) 100 MG 24 hr tablet TAKE 1 TABLET BY MOUTH DAILY. TAKE WITH OR IMMEDIATELY FOLLOWING A MEAL.   olmesartan  (BENICAR ) 40 MG tablet Take 1 tablet (40 mg total) by mouth daily.   topiramate  (TOPAMAX ) 25 MG  tablet Take 1 tablet (25 mg total) by mouth 2 (two) times daily.   verapamil  (CALAN -SR) 120 MG CR tablet TAKE 1 TABLET BY MOUTH AT BEDTIME.   VEVYE  0.1 % SOLN Apply 1 drop to eye 2 (two) times daily.     Allergies:   Prednisone  and Penicillins   Social History   Socioeconomic History   Marital status: Married    Spouse name: [name]   Number of children: 2   Years of education: 12   Highest education level: Not on file  Occupational History    Employer: [employer]  Tobacco Use   Smoking status: Never   Smokeless tobacco: Never  Vaping Use   Vaping status: Never Used  Substance and Sexual Activity   Alcohol use: No   Drug use: No   Sexual activity: Yes    Partners: Male  Other Topics Concern   Not on file  Social History Narrative   Married.  Lives in [place] with husband.        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/08/2023)   Overall Financial Resource Strain (CARDIA)    Difficulty of Paying Living Expenses: Not hard at all  Food Insecurity: No Food Insecurity (10/08/2023)   Hunger Vital Sign    Worried About Running Out of Food in the Last Year: Never true    Ran Out of Food in the Last Year: Never true  Transportation Needs: No Transportation Needs (10/08/2023)   PRAPARE - Administrator, Civil Service (Medical): No    Lack of Transportation (Non-Medical): No  Physical Activity: Sufficiently Active (10/08/2023)   Exercise Vital Sign    Days of Exercise per Week: 7 days    Minutes of Exercise per Session: 40 min  Stress: No Stress Concern Present (10/08/2023)   [employer] of Occupational Health - Occupational Stress Questionnaire    Feeling of Stress : Not at all  Social Connections: Socially Isolated (10/08/2023)   Social Connection and Isolation Panel    Frequency of Communication with Friends and Family: More than three times a week    Frequency of Social Gatherings with Friends and Family: Three  times a week    Attends Religious Services: Never    Active Member of Clubs or Organizations: No    Attends Banker Meetings: Never    Marital Status: Widowed     Family History: The patient's family history includes Diabetes in her brother and sister; Heart attack (age of onset: 63) in her father; Leukemia in her mother. There is no history of Migraines.  ROS:   Please see the history of present illness.  All other systems reviewed and are negative.  EKGs/Labs/Other Studies Reviewed:    The following studies were reviewed today:       Recent Labs: 07/03/2023: ALT 13; BUN 11; Creatinine 1.09; Hemoglobin 12.8; Platelet Count 134; Potassium 3.4; Sodium 142  Recent Lipid Panel    Component Value Date/Time   CHOL 177 12/05/2020 0949   TRIG 183 (H) 12/05/2020 0949   HDL 56 12/05/2020 0949   CHOLHDL 3.2 12/05/2020 0949   CHOLHDL 3.4 12/27/2016 1029   VLDL 28 12/27/2016 1029   LDLCALC 90 12/05/2020 0949     Risk Assessment/Calculations:           Physical Exam:    VS:  BP (!) 150/100   Pulse (!) 58   Ht 5' 1 (1.549 m)   Wt 127 lb 3.2 oz (57.7 kg)   SpO2 97%   BMI 24.03 kg/m     Wt Readings from Last 3 Encounters:  02/18/24 127 lb 3.2 oz (57.7 kg)  02/12/24 126 lb 6.4 oz (57.3 kg)  01/24/24 126 lb 3.2 oz (57.2 kg)     GEN:  Well nourished, well developed in no acute distress HEENT: Normal NECK: No JVD; No carotid bruits LYMPHATICS: No lymphadenopathy CARDIAC: RRR, no murmurs, rubs, gallops RESPIRATORY:  Clear to auscultation without rales, wheezing or rhonchi  ABDOMEN: Soft, non-tender, non-distended MUSCULOSKELETAL:  No edema; No deformity  SKIN: Warm and dry NEUROLOGIC:  Alert and oriented x 3 PSYCHIATRIC:  Normal affect   ASSESSMENT:    1. Primary hypertension   2. Hyperlipidemia with target LDL less than 70   3. Chemotherapy induced cardiomyopathy (HCC)   4. Temporal headache   5. History of B-cell lymphoma   6. Aortic  atherosclerosis (HCC)   7. Essential hypertension    PLAN:    In order of problems listed above:  Hypertension - amlodipine  D/C'ed due to verapamil  - verapamil  for headache prevention - now on 40 mg olmesartan  - BMP collected today - BP today is elevated, but she has not taken olmesartan  this morning - no medication changes as she is controlled at home   Hyperlipidemia  Labs by PCP Continue lipitor 20 mg   Prior cardiomyopathy - felt induced by chemotherapy - last echo 2021 that showed normalization of LVEF 55-60% - reassuring NM 2014   Sinus bradycardia - on 100 mg toprol  - continue for now, she is asymptomatic - could ultimately switch to coreg if more BP control needed   Follow up in 6 months as already scheduled with Dr. Swaziland. I will see her in 1 year.         Medication Adjustments/Labs and Tests Ordered: Current medicines are reviewed at length with the patient today.  Concerns regarding medicines are outlined above.  No orders of the defined types were placed in this encounter.  No orders of the defined types were placed in this encounter.   Patient Instructions  Medication Instructions:  No medication changes were made during today's visit.   *If you need a refill on your cardiac medications before your next appointment, please call your pharmacy*   Lab Work: Labs will drawn today.............BMET If you have labs (blood work) drawn today and your tests are completely normal, you will receive your results only by: MyChart Message (if you have MyChart) OR A paper copy in the mail If you have any lab test that is abnormal or we need to change your treatment, we will call you to review the results.  Testing/Procedures: No procedures were ordered during today's visit.    Follow-Up: At [facility], you and your health needs are our priority.  As part of our continuing mission to provide you with exceptional heart care, we have created  designated Provider Care Teams.  These Care Teams include your primary Cardiologist (physician) and Advanced Practice Providers (APPs -  Physician Assistants and Nurse Practitioners) who all work together to provide you with the care you need, when you need it.  We recommend signing up for the patient portal called MyChart.  Sign up information is provided on this After Visit Summary.  MyChart is used to connect with patients for Virtual Visits (Telemedicine).  Patients are able to view lab/test results, encounter notes, upcoming appointments, etc.  Non-urgent messages can be sent to your provider as well.   To learn more about what you can do with MyChart, go to [email or web address].    Your next appointment:    KEEP SCHEDULED APPT  Provider:   Peter Swaziland, MD    Other Instructions Thank you for choosing [facility]!       Signed, Jon Nat Hails, [place]  02/18/2024 8:34 AM    [facility]

## 2024-02-11 ENCOUNTER — Ambulatory Visit: Payer: Self-pay

## 2024-02-11 NOTE — Telephone Encounter (Signed)
 FYI Only or Action Required?: FYI only for provider.  Patient was last seen in primary care on 02/20/2023 by Joyce Norleen BROCKS, MD.  Called Nurse Triage reporting Joint Swelling. Knee swelling of and on for a long time.  Pt says it's sticking out. Now causing mild pain with use.  Symptoms began several months ago.  Interventions attempted: Nothing.  Symptoms are: gradually worsening.  Triage Disposition: See PCP Within 2 Weeks  Patient/caregiver understands and will follow disposition?: Yes                       Copied from CRM [id number]. Topic: Clinical - Red Word Triage >> Feb 11, 2024 10:13 AM Graeme ORN wrote: Red Word that prompted transfer to Nurse Triage: Knee cap right knee, knot, looks like bone petruding, growing, starting to bother her. Painful when active    ----------------------------------------------------------------------- From previous Reason for Contact - Scheduling: Patient/patient representative is calling to schedule an appointment. Refer to attachments for appointment information. Reason for Disposition  Knee swelling is a chronic symptom (recurrent or ongoing AND present > 4 weeks)  Answer Assessment - Initial Assessment Questions 1. LOCATION: Where is the swelling located?  (e.g., left, right, both knees)     Right knee - like a knot - pt thinks it might be a bone spur 2. ONSET: When did the swelling start? Does it come and go, or is it there all the time?     1 year 3. SWELLING: How bad is the swelling? Or, How large is it? (e.g., mild, moderate, severe; size of localized swelling)      Yes - did come and go now all the time 4. PAIN: Is there any pain? If Yes, ask: How bad is it? (Scale 0-10; or none, mild, moderate, severe)     Starting hurt with use 5. SETTING: Has there been any recent work, exercise or other activity that involved that part of the body?      Does her own yard work  6. AGGRAVATING FACTORS: What  makes the knee swelling worse? (e.g., walking, climbing stairs, running)     use 7. ASSOCIATED SYMPTOMS: Is there any pain or redness?     Some pain 8. OTHER SYMPTOMS: Do you have any other symptoms? (e.g., calf pain, chest pain, difficulty breathing, fever)     no  Protocols used: Knee Swelling-A-AH

## 2024-02-11 NOTE — Telephone Encounter (Signed)
 Pt has an appt tomorrow with Dr. Joyce

## 2024-02-12 ENCOUNTER — Ambulatory Visit (INDEPENDENT_AMBULATORY_CARE_PROVIDER_SITE_OTHER): Admitting: Family Medicine

## 2024-02-12 ENCOUNTER — Encounter: Payer: Self-pay | Admitting: Family Medicine

## 2024-02-12 VITALS — BP 130/78 | HR 55 | Ht 61.0 in | Wt 126.4 lb

## 2024-02-12 DIAGNOSIS — L729 Follicular cyst of the skin and subcutaneous tissue, unspecified: Secondary | ICD-10-CM | POA: Diagnosis not present

## 2024-02-12 DIAGNOSIS — R052 Subacute cough: Secondary | ICD-10-CM

## 2024-02-12 NOTE — Patient Instructions (Signed)
 Pay more attention to see if there is a pattern to when you are coughing

## 2024-02-12 NOTE — Progress Notes (Signed)
   Subjective:    Patient ID: Tonya Wise, female    DOB: 03-01-1941, 83 y.o.   MRN: [id number]  HPI She is here for evaluation of a lesion present on her right knee that she would like evaluated.  She states that this has been there for several years.  It does not cause her any trouble.  She also states that her relatives are concerned over the fact that she has a cough that she has had for at least 6 months with occasional chest tightness but no fever, chills, sore throat, earache, shortness of breath, change in her ADLs.  She cannot relate this to any position.   Review of Systems     Objective:    Physical Exam Alert and in no distress. Tympanic membranes and canals are normal. Pharyngeal area is normal. Neck is supple without adenopathy or thyromegaly. Cardiac exam shows a regular sinus rhythm without murmurs or gallops. Lungs are clear to auscultation. Exam of the right knee shows a round smooth movable lesion.       Assessment & Plan:  Cyst of skin  Subacute cough I explained that the cyst on her knee is not part of the knee but on the skin over it and of no major concern.  She was comfortable with that.  I then discussed the cough that she is having and the fact she she has no other symptoms associated with it recommend she pay more attention to see if there is apparent when she does have a cough.

## 2024-02-18 ENCOUNTER — Encounter: Payer: Self-pay | Admitting: Physician Assistant

## 2024-02-18 ENCOUNTER — Ambulatory Visit: Attending: Cardiology | Admitting: Physician Assistant

## 2024-02-18 VITALS — BP 150/100 | HR 58 | Ht 61.0 in | Wt 127.2 lb

## 2024-02-18 DIAGNOSIS — I427 Cardiomyopathy due to drug and external agent: Secondary | ICD-10-CM | POA: Insufficient documentation

## 2024-02-18 DIAGNOSIS — T451X5D Adverse effect of antineoplastic and immunosuppressive drugs, subsequent encounter: Secondary | ICD-10-CM | POA: Diagnosis not present

## 2024-02-18 DIAGNOSIS — I700 Atherosclerosis of aorta: Secondary | ICD-10-CM | POA: Insufficient documentation

## 2024-02-18 DIAGNOSIS — T451X5A Adverse effect of antineoplastic and immunosuppressive drugs, initial encounter: Secondary | ICD-10-CM | POA: Diagnosis not present

## 2024-02-18 DIAGNOSIS — E785 Hyperlipidemia, unspecified: Secondary | ICD-10-CM | POA: Insufficient documentation

## 2024-02-18 DIAGNOSIS — I10 Essential (primary) hypertension: Secondary | ICD-10-CM | POA: Insufficient documentation

## 2024-02-18 DIAGNOSIS — Z8572 Personal history of non-Hodgkin lymphomas: Secondary | ICD-10-CM | POA: Insufficient documentation

## 2024-02-18 DIAGNOSIS — R519 Headache, unspecified: Secondary | ICD-10-CM | POA: Diagnosis not present

## 2024-02-18 LAB — BASIC METABOLIC PANEL WITH GFR
BUN/Creatinine Ratio: 12 (ref 12–28)
BUN: 16 mg/dL (ref 8–27)
CO2: 20 mmol/L (ref 20–29)
Calcium: 8.9 mg/dL (ref 8.7–10.3)
Chloride: 109 mmol/L — ABNORMAL HIGH (ref 96–106)
Creatinine, Ser: 1.35 mg/dL — ABNORMAL HIGH (ref 0.57–1.00)
Glucose: 77 mg/dL (ref 70–99)
Potassium: 3.7 mmol/L (ref 3.5–5.2)
Sodium: 145 mmol/L — ABNORMAL HIGH (ref 134–144)
eGFR: 39 mL/min/1.73 — ABNORMAL LOW (ref >59)

## 2024-02-18 NOTE — Patient Instructions (Signed)
 Medication Instructions:  No medication changes were made during today's visit.   *If you need a refill on your cardiac medications before your next appointment, please call your pharmacy*   Lab Work: Labs will drawn today.............BMET If you have labs (blood work) drawn today and your tests are completely normal, you will receive your results only by: MyChart Message (if you have MyChart) OR A paper copy in the mail If you have any lab test that is abnormal or we need to change your treatment, we will call you to review the results.   Testing/Procedures: No procedures were ordered during today's visit.    Follow-Up: At [facility], you and your health needs are our priority.  As part of our continuing mission to provide you with exceptional heart care, we have created designated Provider Care Teams.  These Care Teams include your primary Cardiologist (physician) and Advanced Practice Providers (APPs -  Physician Assistants and Nurse Practitioners) who all work together to provide you with the care you need, when you need it.  We recommend signing up for the patient portal called MyChart.  Sign up information is provided on this After Visit Summary.  MyChart is used to connect with patients for Virtual Visits (Telemedicine).  Patients are able to view lab/test results, encounter notes, upcoming appointments, etc.  Non-urgent messages can be sent to your provider as well.   To learn more about what you can do with MyChart, go to [email or web address].    Your next appointment:    KEEP SCHEDULED APPT  Provider:   Peter Swazil[place], MD    Other Instructions Thank you for choosing [facility]!

## 2024-02-19 ENCOUNTER — Ambulatory Visit: Payer: Self-pay | Admitting: *Deleted

## 2024-03-24 ENCOUNTER — Ambulatory Visit: Payer: Self-pay | Admitting: *Deleted

## 2024-03-24 NOTE — Telephone Encounter (Signed)
 FYI Only or Action Required?: FYI only for provider.  Patient was last seen in primary care on 02/12/2024 by Joyce Norleen BROCKS, MD.  Called Nurse Triage reporting eye redness.  Symptoms began several days ago.  Interventions attempted: Nothing.  Symptoms are: unchanged.  Triage Disposition: See PCP When Office is Open (Within 3 Days)  Patient/caregiver understands and will follow disposition?: yes   Reason for Disposition  Bleeding on white of the eye  Answer Assessment - Initial Assessment Questions 1. LOCATION: Where is the redness? (e.g., eyeball or outer eyelids) Note: When callers say the eye is red, they usually mean the sclera (white of the eye) is red.       Blood red eyeball- right eye 2. ONE OR BOTH EYES: Is the redness in one or both eyes?      Only eyeball of right eye 3. ONSET: When did the redness start? (e.g., hours, days)      Saturday am 4. EYELIDS: Are the eyelids red or swollen? If Yes, ask: How much?      no 5. VISION: Do you have blurred vision?     no 6. ITCHING: Does it feel itchy? If so ask: How bad is it (Scale 1-10; or mild, moderate, severe)     no 7. PAIN: Is it painful? If Yes, ask: How bad is the pain? (Scale 0-10; or none, mild, moderate, severe)     Burning sensation- not constant 8. CONTACT LENS: Do you wear contacts?     no 9. CAUSE: What do you think is causing the redness?     unsure 10. OTHER SYMPTOMS: Do you have any other symptoms? (e.g., fever, runny nose, cough, vomiting)       cough  Protocols used: Eye - Redness-A-AH  Copied from CRM [id number]. Topic: Clinical - Red Word Triage >> Mar 24, 2024 12:18 PM Charlet HERO wrote: Red Word that prompted transfer to Nurse Triage: Patient woke from a Friday night and her eye is blood shot red she says its not blurry, burning sensatiion not swolen on the outside just in her eyeball.

## 2024-03-25 ENCOUNTER — Encounter: Payer: Self-pay | Admitting: Family Medicine

## 2024-03-25 ENCOUNTER — Ambulatory Visit (INDEPENDENT_AMBULATORY_CARE_PROVIDER_SITE_OTHER): Admitting: Family Medicine

## 2024-03-25 VITALS — BP 132/82 | HR 80 | Temp 97.8°F | Ht 61.0 in | Wt 126.2 lb

## 2024-03-25 DIAGNOSIS — J45909 Unspecified asthma, uncomplicated: Secondary | ICD-10-CM

## 2024-03-25 DIAGNOSIS — H1131 Conjunctival hemorrhage, right eye: Secondary | ICD-10-CM

## 2024-03-25 MED ORDER — ALBUTEROL SULFATE HFA 108 (90 BASE) MCG/ACT IN AERS
2.0000 | INHALATION_SPRAY | Freq: Four times a day (QID) | RESPIRATORY_TRACT | 0 refills | Status: AC | PRN
Start: 2024-03-25 — End: ?

## 2024-03-25 NOTE — Progress Notes (Signed)
   Subjective:    Patient ID: Tonya Wise, female    DOB: November 23, 1940, 83 y.o.   MRN: [id number]  Discussed the use of AI scribe software for clinical note transcription with the patient, who gave verbal consent to proceed.  History of Present Illness   Tonya Wise is an 83 year old female who presents with a subconjunctival hemorrhage in the right eye.  The subconjunctival hemorrhage in the right eye was first noticed on Saturday morning. She did not experience any symptoms during the night and was not aware of any injury to the eye. She describes a burning sensation that comes and goes, and a little bit of aching in the corner of the eye, but no constant pain. There is no drainage from the eye, and her vision remains unchanged. She reports no constant pain in the eye, and her headaches are not related to the eye. She has not noticed any changes in her vision. No bleeding elsewhere on her body.  She has been experiencing a dry, hacky cough and sore throat for a couple of weeks, which started before the eye issue. Her daughter has noticed the cough. There is no fever or chills associated with these symptoms.   She notes that the cough is more likely when she goes outside and does have an underlying history of allergies.       Review of Systems     Objective:    Physical Exam Alert and in no distress.  Exam of the right eye does show extensive subconjunctival hemorrhaging encompassing the whole conjunctivohowever the cornea and anterior chamber is normal.  EOMI.  Tympanic membranes and canals are normal. Pharyngeal area is normal. Neck is supple without adenopathy or thyromegaly. Cardiac exam shows a regular sinus rhythm without murmurs or gallops. Lungs are clear to auscultation.           Assessment & Plan:  Assessment and Plan    Acute upper respiratory infection with cough and sore throat -- Advise symptomatic treatment with Robitussin DM during the day and Nyquil at  night. - Reassess if symptoms persist beyond the end of the week. Will also get Proventil  to help with the coughing especially since it occurs with her allergies. Subconjunctival hemorrhage, right eye Spontaneous subconjunctival hemorrhage, likely due to coughing. No corneal involvement. - Monitor for resolution.   I discussed the hemorrhage in detail with her and showed her pictures online of this.  She was comfortable with that.

## 2024-03-27 ENCOUNTER — Other Ambulatory Visit: Payer: Self-pay | Admitting: Family Medicine

## 2024-03-27 DIAGNOSIS — M810 Age-related osteoporosis without current pathological fracture: Secondary | ICD-10-CM

## 2024-03-27 MED ORDER — ALENDRONATE SODIUM 70 MG PO TABS: ORAL_TABLET | ORAL | 0 refills | Status: AC

## 2024-04-16 DIAGNOSIS — Z85828 Personal history of other malignant neoplasm of skin: Secondary | ICD-10-CM | POA: Diagnosis not present

## 2024-04-16 DIAGNOSIS — L570 Actinic keratosis: Secondary | ICD-10-CM | POA: Diagnosis not present

## 2024-04-16 DIAGNOSIS — C44722 Squamous cell carcinoma of skin of right lower limb, including hip: Secondary | ICD-10-CM | POA: Diagnosis not present

## 2024-04-16 DIAGNOSIS — D0462 Carcinoma in situ of skin of left upper limb, including shoulder: Secondary | ICD-10-CM | POA: Diagnosis not present

## 2024-04-21 ENCOUNTER — Other Ambulatory Visit: Payer: Self-pay | Admitting: Adult Health

## 2024-04-22 DIAGNOSIS — D099 Carcinoma in situ, unspecified: Secondary | ICD-10-CM | POA: Insufficient documentation

## 2024-05-05 DIAGNOSIS — H16223 Keratoconjunctivitis sicca, not specified as Sjogren's, bilateral: Secondary | ICD-10-CM | POA: Diagnosis not present

## 2024-05-05 DIAGNOSIS — H04123 Dry eye syndrome of bilateral lacrimal glands: Secondary | ICD-10-CM | POA: Diagnosis not present

## 2024-05-08 DIAGNOSIS — Z23 Encounter for immunization: Secondary | ICD-10-CM | POA: Diagnosis not present

## 2024-06-23 ENCOUNTER — Other Ambulatory Visit: Payer: Self-pay | Admitting: Internal Medicine

## 2024-06-23 ENCOUNTER — Ambulatory Visit (HOSPITAL_COMMUNITY)
Admission: RE | Admit: 2024-06-23 | Discharge: 2024-06-23 | Disposition: A | Source: Ambulatory Visit | Attending: Internal Medicine | Admitting: Internal Medicine

## 2024-06-23 ENCOUNTER — Inpatient Hospital Stay: Payer: Medicare Other | Attending: Internal Medicine

## 2024-06-23 DIAGNOSIS — Z8572 Personal history of non-Hodgkin lymphomas: Secondary | ICD-10-CM

## 2024-06-23 DIAGNOSIS — K8020 Calculus of gallbladder without cholecystitis without obstruction: Secondary | ICD-10-CM | POA: Diagnosis not present

## 2024-06-23 DIAGNOSIS — C8510 Unspecified B-cell lymphoma, unspecified site: Secondary | ICD-10-CM | POA: Diagnosis not present

## 2024-06-23 DIAGNOSIS — I2510 Atherosclerotic heart disease of native coronary artery without angina pectoris: Secondary | ICD-10-CM | POA: Diagnosis not present

## 2024-06-23 LAB — CBC WITH DIFFERENTIAL (CANCER CENTER ONLY)
Abs Immature Granulocytes: 0.01 K/uL (ref 0.00–0.07)
Basophils Absolute: 0 K/uL (ref 0.0–0.1)
Basophils Relative: 1 %
Eosinophils Absolute: 0.1 K/uL (ref 0.0–0.5)
Eosinophils Relative: 1 %
HCT: 39.3 % (ref 36.0–46.0)
Hemoglobin: 13.1 g/dL (ref 12.0–15.0)
Immature Granulocytes: 0 %
Lymphocytes Relative: 24 %
Lymphs Abs: 1.2 K/uL (ref 0.7–4.0)
MCH: 28.3 pg (ref 26.0–34.0)
MCHC: 33.3 g/dL (ref 30.0–36.0)
MCV: 84.9 fL (ref 80.0–100.0)
Monocytes Absolute: 0.4 K/uL (ref 0.1–1.0)
Monocytes Relative: 8 %
Neutro Abs: 3.3 K/uL (ref 1.7–7.7)
Neutrophils Relative %: 66 %
Platelet Count: 116 K/uL — ABNORMAL LOW (ref 150–400)
RBC: 4.63 MIL/uL (ref 3.87–5.11)
RDW: 14.4 % (ref 11.5–15.5)
WBC Count: 5 K/uL (ref 4.0–10.5)
nRBC: 0 % (ref 0.0–0.2)

## 2024-06-23 LAB — CMP (CANCER CENTER ONLY)
ALT: 16 U/L (ref 0–44)
AST: 30 U/L (ref 15–41)
Albumin: 4.3 g/dL (ref 3.5–5.0)
Alkaline Phosphatase: 60 U/L (ref 38–126)
Anion gap: 9 (ref 5–15)
BUN: 14 mg/dL (ref 8–23)
CO2: 28 mmol/L (ref 22–32)
Calcium: 9.2 mg/dL (ref 8.9–10.3)
Chloride: 109 mmol/L (ref 98–111)
Creatinine: 1.22 mg/dL — ABNORMAL HIGH (ref 0.44–1.00)
GFR, Estimated: 44 mL/min — ABNORMAL LOW (ref >60)
Glucose, Bld: 101 mg/dL — ABNORMAL HIGH (ref 70–99)
Potassium: 3.4 mmol/L — ABNORMAL LOW (ref 3.5–5.1)
Sodium: 145 mmol/L (ref 135–145)
Total Bilirubin: 0.7 mg/dL (ref 0.0–1.2)
Total Protein: 6.3 g/dL — ABNORMAL LOW (ref 6.5–8.1)

## 2024-06-23 LAB — LACTATE DEHYDROGENASE: LDH: 261 U/L — ABNORMAL HIGH (ref 105–235)

## 2024-06-23 MED ORDER — SODIUM CHLORIDE (PF) 0.9 % IJ SOLN
INTRAMUSCULAR | Status: AC
  Filled 2024-06-23: qty 50

## 2024-06-23 MED ORDER — IOHEXOL 9 MG/ML PO SOLN
ORAL | Status: AC
  Filled 2024-06-23: qty 1000

## 2024-06-23 MED ORDER — IOHEXOL 300 MG/ML  SOLN
75.0000 mL | Freq: Once | INTRAMUSCULAR | Status: AC | PRN
  Administered 2024-06-23: 75 mL via INTRAVENOUS

## 2024-06-23 MED ORDER — IOHEXOL 9 MG/ML PO SOLN
1000.0000 mL | ORAL | Status: AC
Start: 2024-06-23 — End: 2024-06-23
  Administered 2024-06-23: 1000 mL via ORAL

## 2024-07-06 ENCOUNTER — Inpatient Hospital Stay: Payer: Medicare Other | Attending: Internal Medicine | Admitting: Internal Medicine

## 2024-07-06 ENCOUNTER — Ambulatory Visit: Admitting: Adult Health

## 2024-07-06 VITALS — BP 196/93 | HR 62 | Temp 97.7°F | Resp 17 | Ht 61.0 in | Wt 120.0 lb

## 2024-07-06 DIAGNOSIS — Z8572 Personal history of non-Hodgkin lymphomas: Secondary | ICD-10-CM | POA: Diagnosis present

## 2024-07-06 NOTE — Progress Notes (Unsigned)
 [facility] [address] [place]. [place] [address] [phone]       OFFICE FOLLOW UP NOTE  [name] Date of Birth:  [birth year] Medical Record Number:  [id number]    Primary neurologist: Dr. Chalice Reason for visit: Headaches    SUBJECTIVE:  CHIEF COMPLAINT:  No chief complaint on file.   Follow-up visit:  Prior visit: 12/04/2023  Brief HPI:   Tonya Wise is a 83 y.o. female with PMH of TIA, anxiety, non-Hodgkin's lymphoma s/p chemo 2015, anemia of chronic disease, HTN, cardiomyopathy, chronic systolic and diastolic heart failure and hx of PE who was evaluated by Dr. Chalice on 01/28/2023 for evaluation of chronic headaches (onset 20+ years ago).  Previously seen by Dr. Skeet in 2015, reported evolution of headaches since then being more localized and bitemporal and retro-orbital with sharp pain type quality followed by dull ache, no photophobia or phonophobia.  MRI brain 02/2023 chronic left thalamic stroke otherwise unremarkable, MRA head 02/2023 unremarkable.   At prior visit, she was started on topiramate  for continued headaches and continued verapamil  120 mg daily.   Interval history:      Returns for follow-up visit.  She continues to experience headaches about 2-3 times per week. She has a headache this morning. Continues to be bitemporal and behind eyes with pressure sensation.  Typically will use Tylenol  with benefit but will only use with more prolonged headaches.  She was unable to obtain Nurtec due to cost (~$600/month).  She remains on verapamil  120 mg daily.  Initially reported unable to identify specific triggers but towards end of visit, mentioned having anxiety about situation she has no control over such as her granddaughter being a traveling nurse in [place] and is flying back today, she feels that is why she has a headache today.  She does try to keep very active which she feels helps her anxiety.  She does endorse adequate  water intake.  She is interested in further preventative options for continued headaches.     ROS:   14 system review of systems performed and negative with exception of those listed in HPI  PMH:  Past Medical History:  Diagnosis Date   Allergy    Anemia    Ankle fracture, left    Chemotherapy induced cardiomyopathy    a. 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   Stroke (HCC) 2015   per pt while she was in hospital for Nonhodgkins Lymphoma    PSH:  Past Surgical History:  Procedure Laterality Date   ABDOMINAL HYSTERECTOMY     ANTERIOR LAT LUMBAR FUSION N/A 06/28/2020   Procedure: Lumbar two-three Anterolateral lumbar interbody fusion with lateral plate;  Surgeon: Colon Shove, MD;  Location: [facility] OR;  Service: Neurosurgery;  Laterality: N/A;   APPENDECTOMY     BACK SURGERY     LOWER BACK TWICE   COLONOSCOPY  07/30/2006   ESOPHAGOGASTRODUODENOSCOPY N/A 03/29/2013   Procedure: ESOPHAGOGASTRODUODENOSCOPY (EGD);  Surgeon: Renaye Sous, MD;  Location: [facility] ENDOSCOPY;  Service: Endoscopy;  Laterality: N/A;   INGUINAL LYMPH NODE BIOPSY Right 03/31/2013   Procedure: INGUINAL LYMPH NODE BIOPSY;  Surgeon: Lynwood MALVA Pina, MD;  Location: [facility] OR;  Service: General;  Laterality: Right;   ORIF ANKLE FRACTURE Left 11/03/2015   Procedure: OPEN REDUCTION INTERNAL FIXATION (ORIF) LEFT ANKLE FRACTURE;  Surgeon: Norleen Armor, MD;  Location: [facility];  Service: Orthopedics;  Laterality: Left;   SKIN BIOPSY Left 02/27/2022   well differenited squamous cell carcinoma   SKIN BIOPSY Right 05/17/2023   Squamous cell carcinoma, keratoacanthoma type   SPINE SURGERY     SQUAMOUS  CELL CARCINOMA EXCISION  10/29/2023   Dr.Jewell    Social History:  Social History   Socioeconomic History   Marital status: Widowed    Spouse name: [name]   Number of children: 2   Years of education: 12   Highest education level: Not on file  Occupational History    Employer: [employer]  Tobacco Use   Smoking status: Never   Smokeless tobacco: Never  Vaping Use   Vaping status: Never Used  Substance and Sexual Activity   Alcohol use: No   Drug use: No   Sexual activity: Yes    Partners: Male  Other Topics Concern   Not on file  Social History Narrative   Married.  Lives in [place] with husband.        Retired    Teacher, Early Years/pre Strain: Low Risk  (10/08/2023)   Overall Financial Resource Strain (CARDIA)    Difficulty of Paying Living Expenses: Not hard at all  Food Insecurity: No Food Insecurity (10/08/2023)   Hunger Vital Sign    Worried About Running Out of Food in the Last Year: Never true    Ran Out of Food in the Last Year: Never true  Transportation Needs: No Transportation Needs (10/08/2023)   PRAPARE - Administrator, Civil Service (Medical): No    Lack of Transportation (Non-Medical): No  Physical Activity: Sufficiently Active (10/08/2023)   Exercise Vital Sign    Days of Exercise per Week: 7 days    Minutes of Exercise per Session: 40 min  Stress: No Stress Concern Present (10/08/2023)   [employer] of Occupational Health - Occupational Stress Questionnaire    Feeling of Stress : Not at all  Social Connections: Socially Isolated (10/08/2023)   Social Connection and Isolation Panel    Frequency of Communication with Friends and Family: More than three times a week    Frequency of Social Gatherings with Friends and Family: Three times a week    Attends Religious Services: Never    Active Member of Clubs or Organizations: No    Attends Banker Meetings: Never    Marital Status: Widowed   Intimate Partner Violence: Not At Risk (10/08/2023)   Humiliation, Afraid, Rape, and Kick questionnaire    Fear of Current or Ex-Partner: No    Emotionally Abused: No    Physically Abused: No    Sexually Abused: No    Family History:  Family History  Problem Relation Age of Onset   Leukemia Mother    Heart attack Father 54   Diabetes Sister    Diabetes Brother    Migraines Neg Hx     Medications:   Current Outpatient Medications on File Prior to Visit  Medication Sig Dispense Refill   albuterol  (VENTOLIN  HFA) 108 (90 Base) MCG/ACT inhaler Inhale 2 puffs into the lungs every 6 (six) hours as needed for wheezing or shortness of breath. 8 g 0   alendronate  (FOSAMAX )  70 MG tablet TAKE 1 TABLET BY MOUTH EVERY 7 DAYS. TAKE WITH A FULL GLASS OF WATER ON AN EMPTY STOMACH 12 tablet 0   ALPRAZolam  (XANAX ) 0.25 MG tablet TAKE 1 TABLET BY MOUTH 2 TIMES DAILY AS NEEDED FOR ANXIETY. 30 tablet 0   aspirin  EC 81 MG tablet Take 1 tablet (81 mg total) by mouth 2 (two) times daily. 90 tablet 0   atorvastatin  (LIPITOR) 20 MG tablet Take 1 tablet (20 mg total) by mouth daily. 90 tablet 3   Melatonin 3 MG CAPS Take 3 mg by mouth at bedtime as needed (sleep).     metoprolol  succinate (TOPROL -XL) 100 MG 24 hr tablet TAKE 1 TABLET BY MOUTH DAILY. TAKE WITH OR IMMEDIATELY FOLLOWING A MEAL. 90 tablet 3   olmesartan  (BENICAR ) 40 MG tablet Take 1 tablet (40 mg total) by mouth daily. 90 tablet 3   topiramate  (TOPAMAX ) 25 MG tablet Take 1 tablet (25 mg total) by mouth 2 (two) times daily. 60 tablet 11   verapamil  (CALAN -SR) 120 MG CR tablet TAKE 1 TABLET BY MOUTH AT BEDTIME. 90 tablet 1   VEVYE  0.1 % SOLN Apply 1 drop to eye 2 (two) times daily.     No current facility-administered medications on file prior to visit.    Allergies:   Allergies  Allergen Reactions   Prednisone  Other (See Comments)    like being in a coma   Penicillins Itching, Rash and Other (See Comments)    burning sensation Has  patient had a PCN reaction causing immediate rash, facial/tongue/throat swelling, SOB or lightheadedness with hypotension: No Has patient had a PCN reaction causing severe rash involving mucus membranes or skin necrosis: No Has patient had a PCN reaction that required hospitalization No Has patient had a PCN reaction occurring within the last 10 years: No If all of the above answers are NO, then may proceed with Cephalosporin use.      OBJECTIVE:  Physical Exam  There were no vitals filed for this visit.   There is no height or weight on file to calculate BMI. No results found.   General: well developed, well nourished, very pleasant elderly Caucasian female, seated, in no evident distress  Neurologic Exam Mental Status: Awake and fully alert. Oriented to place and time. Recent and remote memory intact. Attention span, concentration and fund of knowledge appropriate. Mood and affect appropriate.  Cranial Nerves: Pupils equal, briskly reactive to light. Extraocular movements full without nystagmus. Visual fields full to confrontation. Hearing intact. Facial sensation intact. Face, tongue, palate moves normally and symmetrically.  Motor: Normal bulk and tone. Normal strength in all tested extremity muscles Sensory.: intact to touch , pinprick , position and vibratory sensation.  Coordination: Rapid alternating movements normal in all extremities. Finger-to-nose and heel-to-shin performed accurately bilaterally. Gait and Station: Arises from chair without difficulty. Stance is normal. Gait demonstrates normal stride length and balance without use of AD. Reflexes: 1+ and symmetric. Toes downgoing.         ASSESSMENT/PLAN: Tonya Wise is a 83 y.o. year old female with longstanding history of migraine headaches worsened over the past year after the passing of her husband. Most recent headaches consistent with tension type headaches which seem to be triggered by stress.  MRI brain  and MRA head unremarkable for contributing factors. Does have hx of TIA and evidence of chronic left thalamic infarct on imaging.     Chronic headaches, tension type:  Start topiramate  25 mg twice daily, can  consider dosage increase after 2 to 3 weeks if needed.  Discussed potential side effects and advised to call with any difficulty tolerating. If no benefit or difficulty tolerating, consider duloxetine or venlafaxine Continue verapamil  120 mg nightly (intolerant to higher dosage) Continue Tylenol  as needed, unable to obtain Nurtec due to high cost. Suspect Holland will be similar but if Tylenol  becomes ineffective, can try to see if Holland is cheaper.  Triptans contraindicated due to stroke history Previously tried/failed: Amitriptyline , verapamil , metoprolol  MRI brain 02/2023 chronic left thalamic stroke, no findings contributing to headaches MRA head 02/2023 unremarkable     Follow up in 6 months or call earlier if needed   CC:  PCP: Joyce Norleen BROCKS, MD      Harlene Bogaert, AGNP-BC  [facility] [address] [place], [place] [address]  Phone [phone] Fax [phone] Note: This document was prepared with digital dictation and possible smart phrase technology. Any transcriptional errors that result from this process are unintentional.

## 2024-07-06 NOTE — Progress Notes (Signed)
 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  PRIOR THERAPY: Oncology History  Non-Hodgkins lymphoma [facility]) (Resolved)  03/27/2013 - 03/31/2013 Hospital Admission   Admitted to [facility] for profound anemia (Hgb 6.4).  C/o fevers, drenching nightsweats, weight lost.  Negative colonoscopy and EGD.  CT of C/A/P demonstrated significant lymphadenopathy suspcious of ympha.     03/31/2013 Initial Diagnosis   Biopsy of R inguinal lymph node revealed Non-Hodgkins lymphoma. Diffuse Large B-cell Lymphoma   04/10/2013 - 04/24/2013 Hospital Admission   Hypercalcemia, renal failure and dehydration.  Started min-RCHOP.    04/15/2013 Bone Marrow Biopsy   Involvement with Diffuse Large B-cell Lymphoma. Stage IV NHL w B-symptoms.    04/18/2013 - 04/18/2013 Chemotherapy   Mini R-CHOP Cycle #1 on 04/18/2013. Dose reduced to poor nutrion. It consisted on Cytoxan  400 mg/m2,Doxorubicin  25 mg/m2, Rituximab  375 mg/m2, Vincristine  1mg .  Prednisone  100 mg daily for 9/13 - 9/16.   Received neupogen  on 9/21, 9/27 -9/30.     04/25/2013 - 04/29/2013 Hospital Admission   Admitted due to worsening mouth sores/oral thrush, odynophagia, failure to thrive.  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  Discharge to [facility] (Dr. Kyle) and discharged to home on 05/19/2013.     06/01/2013 - 06/01/2013 Chemotherapy   Mini R-CHO #2 dosed as above.  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  Received neulasta  shot on 06/02/13   06/16/2013 Cancer Staging   Re-staging PET/CT demonstrated significant interval reduction in axillary lymphadenopathy.  There was 1 small residual right axillary lymph node measuring 16 x 12 mm which showed FDG uptake.  Signifcant  reduction in mediastinal and hilar lymphadenopathy.    06/16/2013 Imaging   CT of chest noted left-sided pulmonary embolim. Started on lovenox  to coumadin  bridge.     06/22/2013 - 06/22/2013 Chemotherapy   Mini-R-CHO #3.  Received Neulasta  on 06/23/2013.     07/16/2013 Imaging   2-D Echocardiogram demonstrates 35-45% EF with Grade 2 diastolic dysfunction.  Doxorubicin  discontinued with consultation by cardiology (Dr. Burnard).     08/19/2013 - 08/21/2013 Chemotherapy   Mini-R-CEO #4.  Etoposide  25 mg/m2 instead of doxo due to above.  Etoposide  50mg  bid on day 2/3.  On day 3 (08/21/2013) she received 1 of two oral doses of ectoposide.    08/21/2013 - 08/26/2013 Hospital Admission   Admitted to [facility] due to acute respiratory failure and had influenzae A and pneumonia. Completed tamiflu  and oral antibiotics.    09/25/2013 - 09/27/2013 Chemotherapy   Mini-R-CEO #5. Etoposide  25 mg/m2 25 mg bid on days 2/3.    10/16/2013 - 10/19/2013 Chemotherapy   Mini-R-CEO # 6. Etoposide  25 mg/m2 bid on days 2/3. Neulasta  on day#4.    11/05/2013 Imaging   Interval resolution of the hypermetabolism identified within the enlarged right axillary lymph node seen previously. This lymph node has also decreased markedly in size in the interval. No new hypermetabolic lesions in the neck, chest, abdomen, or  pelvis.    11/19/2013 Bone Marrow Biopsy   There is no evidence of a B-cell lymphoproliferative process in this material. Normal cytogenetics.    04/16/2014 Imaging   CT scan Chest, abdomen: No evidence of recurrent lymphoma within the chest or abdomen.   Significant decrease in splenomegaly since prior exam.  CURRENT THERAPY: Observation.  INTERVAL HISTORY: Tonya Wise 83 y.o. female returns to the clinic today for annual follow-up visit.Discussed the use of AI scribe software for clinical note transcription with the patient, who gave verbal consent to proceed.  History of Present Illness Tonya Wise is an 83 year old female with diffuse large B cell non-Hodgkin lymphoma who presents for evaluation with repeat blood work and CT scan for restaging of her disease.  She was diagnosed with diffuse large B cell non-Hodgkin lymphoma in September 2014 and underwent treatment with CHOP. She has been on observation for the past ten years and is here for evaluation with repeat blood work and a CT scan of the chest, abdomen, and pelvis to restage her disease.  No new complaints or symptoms have been reported over the past year. She mentions that her blood pressure is high, which she attributes to the stress of driving to the appointment. She experiences a headache this morning and has not yet taken her blood pressure medication, as she has not eaten. She regularly checks her blood pressure at home and notes that it usually runs high, though not as high as today. She has an upcoming appointment with her family doctor for a wellness check.  She remains active, continuing to do yard work and stay physically active. No significant weight loss, weakness, or swollen glands have been experienced.    MEDICAL HISTORY: Past Medical History:  Diagnosis Date   Allergy    Anemia    Ankle fracture, left    Chemotherapy induced cardiomyopathy    a. 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  MEDICATIONS:  Current Outpatient Medications  Medication Sig Dispense Refill   albuterol  (VENTOLIN  HFA) 108 (90 Base) MCG/ACT inhaler Inhale 2 puffs into the lungs  every 6 (six) hours as needed for wheezing or shortness of breath. 8 g 0   alendronate  (FOSAMAX ) 70 MG tablet TAKE 1 TABLET BY MOUTH EVERY 7 DAYS. TAKE WITH A FULL GLASS OF WATER ON AN EMPTY STOMACH 12 tablet 0   ALPRAZolam  (XANAX ) 0.25 MG tablet TAKE 1 TABLET BY MOUTH 2 TIMES DAILY AS NEEDED FOR ANXIETY. 30 tablet 0   aspirin  EC 81 MG tablet Take 1 tablet (81 mg total) by mouth 2 (two) times daily. 90 tablet 0   atorvastatin  (LIPITOR) 20 MG tablet Take 1 tablet (20 mg total) by mouth daily. 90 tablet 3   Melatonin 3 MG CAPS Take 3 mg by mouth at bedtime as needed (sleep).     metoprolol  succinate (TOPROL -XL) 100 MG 24 hr tablet TAKE 1 TABLET BY MOUTH DAILY. TAKE WITH OR IMMEDIATELY FOLLOWING A MEAL. 90 tablet 3   olmesartan  (BENICAR ) 40 MG tablet Take 1 tablet (40 mg total) by mouth daily. 90 tablet 3   topiramate  (TOPAMAX ) 25 MG tablet Take 1 tablet (25 mg total) by mouth 2 (two) times daily. 60 tablet 11   verapamil  (CALAN -SR) 120 MG CR tablet TAKE  1 TABLET BY MOUTH AT BEDTIME. 90 tablet 1   VEVYE  0.1 % SOLN Apply 1 drop to eye 2 (two) times daily.     No current facility-administered medications for this visit.    SURGICAL HISTORY:  Past Surgical History:  Procedure Laterality Date   ABDOMINAL HYSTERECTOMY     ANTERIOR LAT LUMBAR FUSION N/A 06/28/2020   Procedure: Lumbar two-three Anterolateral lumbar interbody fusion with lateral plate;  Surgeon: Colon Shove, MD;  Location: [facility] OR;  Service: Neurosurgery;  Laterality: N/A;   APPENDECTOMY     BACK SURGERY     LOWER BACK TWICE   COLONOSCOPY  07/30/2006   ESOPHAGOGASTRODUODENOSCOPY N/A 03/29/2013   Procedure: ESOPHAGOGASTRODUODENOSCOPY (EGD);  Surgeon: Renaye Sous, MD;  Location: [facility] ENDOSCOPY;  Service: Endoscopy;  Laterality: N/A;   INGUINAL LYMPH NODE BIOPSY Right 03/31/2013   Procedure: INGUINAL LYMPH NODE BIOPSY;  Surgeon: Lynwood MALVA Pina, MD;  Location: [facility] OR;  Service: General;  Laterality: Right;   ORIF ANKLE FRACTURE Left  11/03/2015   Procedure: OPEN REDUCTION INTERNAL FIXATION (ORIF) LEFT ANKLE FRACTURE;  Surgeon: Norleen Armor, MD;  Location: [facility];  Service: Orthopedics;  Laterality: Left;   SKIN BIOPSY Left 02/27/2022   well differenited squamous cell carcinoma   SKIN BIOPSY Right 05/17/2023   Squamous cell carcinoma, keratoacanthoma type   SPINE SURGERY     SQUAMOUS CELL CARCINOMA EXCISION  10/29/2023   Dr.Jewell    REVIEW OF SYSTEMS:  A comprehensive review of systems was negative.   PHYSICAL EXAMINATION: General appearance: alert, cooperative and no distress Head: Normocephalic, without obvious abnormality, atraumatic Neck: no adenopathy, no JVD, supple, symmetrical, trachea midline and thyroid  not enlarged, symmetric, no tenderness/mass/nodules Lymph nodes: Cervical, supraclavicular, and axillary nodes normal. Resp: clear to auscultation bilaterally Back: symmetric, no curvature. ROM normal. No CVA tenderness. Cardio: regular rate and rhythm, S1, S2 normal, no murmur, click, rub or gallop GI: soft, non-tender; bowel sounds normal; no masses,  no organomegaly Extremities: extremities normal, atraumatic, no cyanosis or edema  ECOG PERFORMANCE STATUS: 1 - Symptomatic but completely ambulatory  Blood pressure (!) 196/93, pulse 62, temperature 97.7 F (36.5 C), temperature source Temporal, resp. rate 17, height 5' 1 (1.549 m), weight 120 lb (54.4 kg), SpO2 99%.  LABORATORY DATA: Lab Results  Component Value Date   WBC 5.0 06/23/2024   HGB 13.1 06/23/2024   HCT 39.3 06/23/2024   MCV 84.9 06/23/2024   PLT 116 (L) 06/23/2024      Chemistry      Component Value Date/Time   NA 145 06/23/2024 0940   NA 145 (H) 02/18/2024 0847   NA 141 12/12/2016 0813   K 3.4 (L) 06/23/2024 0940   K 4.0 12/12/2016 0813   CL 109 06/23/2024 0940   CO2 28 06/23/2024 0940   CO2 26 12/12/2016 0813   BUN 14 06/23/2024 0940   BUN 16 02/18/2024 0847   BUN 20.3 12/12/2016 0813   CREATININE  1.22 (H) 06/23/2024 0940   CREATININE 1.46 (H) 08/05/2017 1639   CREATININE 1.3 (H) 12/12/2016 0813      Component Value Date/Time   CALCIUM  9.2 06/23/2024 0940   CALCIUM  8.9 12/12/2016 0813   ALKPHOS 60 06/23/2024 0940   ALKPHOS 55 12/12/2016 0813   AST 30 06/23/2024 0940   AST 13 12/12/2016 0813   ALT 16 06/23/2024 0940   ALT 10 12/12/2016 0813   BILITOT 0.7 06/23/2024 0940   BILITOT 0.55 12/12/2016 0813  RADIOGRAPHIC STUDIES: CT CHEST ABDOMEN PELVIS W CONTRAST Result Date: 07/01/2024 EXAM: CT CHEST, ABDOMEN AND PELVIS WITH CONTRAST 06/23/2024 12:41:02 PM TECHNIQUE: CT of the chest, abdomen and pelvis was performed with the administration of 75 mL of iohexol  (OMNIPAQUE ) 300 MG/ML solution. Multiplanar reformatted images are provided for review. Automated exposure control, iterative reconstruction, and/or weight based adjustment of the mA/kV was utilized to reduce the radiation dose to as low as reasonably achievable. COMPARISON: 06/28/2023 CLINICAL HISTORY: B-cell lymphoma. Surveillance imaging. FINDINGS: CHEST: MEDIASTINUM AND LYMPH NODES: Heart: Moderate multivessel coronary artery calcification. Global cardiac size within normal limits. No pericardial effusion. VASCULATURE (Thoracic): Central pulmonary arteries are of normal caliber. Mild atherosclerotic calcification within the thoracic aorta. No aortic aneurysm. Central airways are clear. No central obstructing lesion. No mediastinal, hilar or axillary lymphadenopathy. LUNGS AND PLEURA: Scattered areas of linear scarring within the lungs bilaterally, again noted. No confluent pulmonary infiltrate. No pulmonary edema. No pneumothorax or pleural effusion. ABDOMEN AND PELVIS: LIVER: The liver is unremarkable. GALLBLADDER AND BILE DUCTS: Cholelithiasis without superimposed pericholecystic inflammatory change. No intra- or extrahepatic biliary ductal dilation. SPLEEN: The spleen is normal in size. No focal intrasplenic lesions are  identified. PANCREAS: No acute abnormality. ADRENAL GLANDS: No acute abnormality. KIDNEYS, URETERS AND BLADDER: The left and right renal arteries likely have hemodynamically significant stenosis, but this is not optimally assessed on this non-angiographic study. No stones in the kidneys or ureters. No hydronephrosis. No perinephric or periureteral stranding. Urinary bladder is unremarkable. GI AND BOWEL: The appendix is not clearly identified; however, there are no secondary signs of appendicitis within the right lower quadrant. The stomach, small bowel, and large bowel are otherwise unremarkable. There is no bowel obstruction. REPRODUCTIVE ORGANS: No acute abnormality. PERITONEUM AND RETROPERITONEUM: No ascites. No free air. VASCULATURE: Aorta is normal in caliber. Moderate aortoiliac atherosclerotic calcification. Extensive atherosclerotic calcification at the origin of the superior mesenteric artery. The left and right renal arteries likely have hemodynamically significant stenosis, but this is not optimally assessed on this non-angiographic study. Peripheral vascular disease. If there is clinical evidence of hemodynamically significant renal artery stenosis, chronic mesenteric ischemia, or lower extremity claudication, CT arteriography may be more helpful for further evaluation. ABDOMINAL AND PELVIS LYMPH NODES: No lymphadenopathy. BONES AND SOFT TISSUES: Bilateral breast implants noted. Postsurgical changes of L2 to L5 lumbar fusion with instrumentation and L4 posterior decompression are again identified. Surgical clips within the right inguinal region may relate to prior lymphadenectomy. No lytic or blastic bone lesions identified. No acute bone abnormality. No focal soft tissue abnormality. IMPRESSION: 1. No evidence of recurrent or residual lymphoma. 2. Cholelithiasis 3. Peripheral vascular disease. If there is clinical evidence of hemodynamically significant renal artery stenosis or chronic mesenteric  ischemia, CT arteriography may be more helpful for further evaluation. 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 The patient has been on observation for the last 10 years. She had repeat CT scan of the chest, abdomen and pelvis performed recently.  I personally independently reviewed the scan and labs and discussed the results with the patient today.  She has no evidence for disease recurrence. Assessment and Plan Assessment & Plan Diffuse large B-cell lymphoma, post-treatment, under surveillance Diffuse large B-cell lymphoma  diagnosed in September 2014, treated with CHOP, and under surveillance for the past ten years. Recent CT scan of the chest, abdomen, and pelvis shows no evidence of disease recurrence. Blood work is unremarkable. No symptoms suggestive of recurrence such as unintentional weight loss, significant weakness, or swollen glands. - Continue annual surveillance with blood work. - Perform full body scan if symptoms such as unintentional weight loss, significant weakness, or swollen glands develop. She was advised to call immediately if she has any other concerning symptoms in the interval.  The patient voices understanding of current disease status and treatment options and is in agreement with the current care plan. All questions were answered. The patient knows to call the clinic with any problems, questions or concerns. We can certainly see the patient much sooner if necessary.   Disclaimer: This note was dictated with voice recognition software. Similar sounding words can inadvertently be transcribed and may not be corrected upon review.

## 2024-07-07 ENCOUNTER — Ambulatory Visit: Admitting: Adult Health

## 2024-07-07 ENCOUNTER — Encounter: Payer: Self-pay | Admitting: Adult Health

## 2024-07-07 VITALS — BP 210/98 | HR 68 | Ht 60.0 in | Wt 121.0 lb

## 2024-07-07 DIAGNOSIS — I160 Hypertensive urgency: Secondary | ICD-10-CM | POA: Diagnosis not present

## 2024-07-07 DIAGNOSIS — G43709 Chronic migraine without aura, not intractable, without status migrainosus: Secondary | ICD-10-CM

## 2024-07-07 DIAGNOSIS — G44221 Chronic tension-type headache, intractable: Secondary | ICD-10-CM

## 2024-07-07 MED ORDER — TOPIRAMATE 25 MG PO TABS: 50.0000 mg | ORAL_TABLET | Freq: Two times a day (BID) | ORAL | Status: AC

## 2024-07-07 MED ORDER — VERAPAMIL HCL ER 120 MG PO TBCR: 120.0000 mg | EXTENDED_RELEASE_TABLET | Freq: Every day | ORAL | 3 refills | Status: DC

## 2024-07-07 NOTE — Patient Instructions (Signed)
 Increase topiramate  dosage to 50mg  twice daily. If tolerating well, please let me know and I will send in an updated prescription  Continue verapamil  120 mg nightly  Continue to monitor blood pressure at home. If you start to develop severe headache or thunderclap type headache, vision changes, stroke like symptoms, shortness of breath, chest or back pain, please call 911 immediately for more emergent evaluation    Follow up in 6 months or call earlier if needed

## 2024-07-08 ENCOUNTER — Ambulatory Visit: Admitting: Family Medicine

## 2024-07-08 ENCOUNTER — Encounter: Payer: Self-pay | Admitting: Family Medicine

## 2024-07-08 VITALS — BP 188/90 | HR 61 | Ht 60.0 in | Wt 119.6 lb

## 2024-07-08 DIAGNOSIS — I10 Essential (primary) hypertension: Secondary | ICD-10-CM | POA: Diagnosis not present

## 2024-07-08 MED ORDER — VERAPAMIL HCL ER 180 MG PO TBCR: 180.0000 mg | EXTENDED_RELEASE_TABLET | Freq: Every day | ORAL | 3 refills | Status: AC

## 2024-07-08 MED ORDER — OLMESARTAN MEDOXOMIL-HCTZ 40-12.5 MG PO TABS: 1.0000 | ORAL_TABLET | Freq: Every day | ORAL | 3 refills | Status: AC

## 2024-07-08 NOTE — Progress Notes (Signed)
 Verapamil    Subjective:    Patient ID: Tonya Wise, female    DOB: 1941/05/04, 83 y.o.   MRN: [id number]  HPI She is here for consult concerning her blood pressure.  Review of the record indicates she did have normal blood pressures in July of this year however since then especially in the last month or so if the pressure has been much elevated.  She was recently seen for evaluation of headaches and again the blood sugar was quite elevated.   Review of Systems     Objective:    Physical Exam Alert and in no distress.  Blood pressure today is recorded at 188/94.       Assessment & Plan:  Essential hypertension - Plan: olmesartan -hydrochlorothiazide  (BENICAR  HCT) 40-12.5 MG tablet, verapamil  (CALAN -SR) 180 MG CR tablet I think is time to get a little bit more aggressive swelling going to adjust her Benicar  as well as the K. Lan.  Also discussed the possibility of being too aggressive and dropping the pressure down too low and symptoms of weakness, dizziness or potentially even passing out.  She will get a new blood pressure cuff and bring it in with her the next time she is here which will be in 2 weeks he will measure it against ours for more accurate readings especially at home.

## 2024-07-10 ENCOUNTER — Telehealth: Payer: Self-pay | Admitting: *Deleted

## 2024-07-10 NOTE — Telephone Encounter (Signed)
 Patient called and states she received a letter about an appointment for 07/07/24, but she was seen on 07/06/24.  States she will just disregard the notice, unless see hears otherwise.  Return call to patient. Informed her I see where she was seen here on 07/06/24.  Advised her it sound likes a communication error, and to disregard.  Advised her to feel free to call if she have further questions or concerns.  Verbalized understanding.

## 2024-07-14 NOTE — Progress Notes (Unsigned)
 Cardiology Office Note:    Date:  07/16/2024   ID:  Tonya Wise, DOB [birth year], MRN [id number]  PCP:  Joyce Norleen BROCKS, MD   [facility] Providers Cardiologist:  Joey Lierman, MD Cardiology APP:  Madie Jon Garre, PA     Referring MD: Joyce Norleen BROCKS, MD   Chief Complaint  Patient presents with   Hyperlipidemia   Hypertension    History of Present Illness:    Tonya Wise is a 83 y.o. female is seen to establish cardiac care. She is a former patient of Dr Burnard. She has a hx of TIA 2015,  anxiety, non-hodgkin's lymphoma s/p chemo 2015, anemia of chronic disease, HTN, chronic systolic and diastolic heart failure, CKD 3b, HLD, and hx of PE no longer on OAC. Low risk NM 2014. 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 Repeat Echo in 2021 showed normal LV function.   She reports that her husband died 2 years ago and she just let herself go- not eating well and not exercising. BP was very high 210 systolic. Now on combination of olmesartan  HCT, verapamil , and Toprol . Reports she feels well. No chest pain or SOB. No edema. No dizziness or HA.   Past Medical History:  Diagnosis Date   Allergy    Anemia    Ankle fracture, left    Chemotherapy induced cardiomyopathy    a. 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   Stroke (HCC) 2015   per pt while she was in hospital for Nonhodgkins Lymphoma    Past Surgical History:  Procedure Laterality Date   ABDOMINAL HYSTERECTOMY     ANTERIOR LAT LUMBAR FUSION N/A 06/28/2020   Procedure: Lumbar two-three Anterolateral lumbar interbody fusion with lateral plate;   Surgeon: Colon Shove, MD;  Location: [facility] OR;  Service: Neurosurgery;  Laterality: N/A;   APPENDECTOMY     BACK SURGERY     LOWER BACK TWICE   COLONOSCOPY  07/30/2006   ESOPHAGOGASTRODUODENOSCOPY N/A 03/29/2013   Procedure: ESOPHAGOGASTRODUODENOSCOPY (EGD);  Surgeon: Renaye Sous, MD;  Location: [facility] ENDOSCOPY;  Service: Endoscopy;  Laterality: N/A;   INGUINAL LYMPH NODE BIOPSY Right 03/31/2013   Procedure: INGUINAL LYMPH NODE BIOPSY;  Surgeon: Lynwood MALVA Pina, MD;  Location: [facility] OR;  Service: General;  Laterality: Right;   ORIF ANKLE FRACTURE Left 11/03/2015   Procedure: OPEN REDUCTION INTERNAL FIXATION (ORIF) LEFT ANKLE FRACTURE;  Surgeon: Norleen Armor, MD;  Location: [facility];  Service: Orthopedics;  Laterality: Left;   SKIN BIOPSY Left 02/27/2022   well differenited squamous cell carcinoma   SKIN BIOPSY Right 05/17/2023   Squamous cell carcinoma, keratoacanthoma type   SPINE SURGERY     SQUAMOUS CELL CARCINOMA EXCISION  10/29/2023   Dr.Jewell    Current Medications: Active Medications[1]   Allergies:   Prednisone  and Penicillins   Social History   Socioeconomic History   Marital status: Widowed    Spouse name: Tonya Wise   Number of children: 2   Years of education: 12   Highest education level: Not on file  Occupational History  Employer: [employer]  Tobacco Use   Smoking status: Never   Smokeless tobacco: Never  Vaping Use   Vaping status: Never Used  Substance and Sexual Activity   Alcohol use: No   Drug use: No   Sexual activity: Yes    Partners: Male  Other Topics Concern   Not on file  Social History Narrative   Married.  Lives in [place] with husband.        Retired    [employer]   Tobacco Use: Low Risk (07/16/2024)   Patient History    Smoking Tobacco Use: Never    Smokeless Tobacco Use: Never    Passive Exposure: Not on file  Financial Resource Strain: Low Risk (10/08/2023)   Overall Financial Resource Strain (CARDIA)     Difficulty of Paying Living Expenses: Not hard at all  Food Insecurity: No Food Insecurity (10/08/2023)   Hunger Vital Sign    Worried About Running Out of Food in the Last Year: Never true    Ran Out of Food in the Last Year: Never true  Transportation Needs: No Transportation Needs (10/08/2023)   PRAPARE - Administrator, Civil Service (Medical): No    Lack of Transportation (Non-Medical): No  Physical Activity: Sufficiently Active (10/08/2023)   Exercise Vital Sign    Days of Exercise per Week: 7 days    Minutes of Exercise per Session: 40 min  Stress: No Stress Concern Present (10/08/2023)   [employer] of Occupational Health - Occupational Stress Questionnaire    Feeling of Stress : Not at all  Social Connections: Socially Isolated (10/08/2023)   Social Connection and Isolation Panel    Frequency of Communication with Friends and Family: More than three times a week    Frequency of Social Gatherings with Friends and Family: Three times a week    Attends Religious Services: Never    Active Member of Clubs or Organizations: No    Attends Banker Meetings: Never    Marital Status: Widowed  Depression (PHQ2-9): Low Risk (10/08/2023)   Depression (PHQ2-9)    PHQ-2 Score: 0  Alcohol Screen: Low Risk (10/08/2023)   Alcohol Screen    Last Alcohol Screening Score (AUDIT): 0  Housing: Low Risk (07/06/2024)   Epic    Unable to Pay for Housing in the Last Year: No    Number of Times Moved in the Last Year: 0    Homeless in the Last Year: No  Utilities: Not At Risk (10/08/2023)   AHC Utilities    Threatened with loss of utilities: No  Health Literacy: Adequate Health Literacy (10/08/2023)   B1300 Health Literacy    Frequency of need for help with medical instructions: Never     Family History: The patient's family history includes Diabetes in her brother and sister; Heart attack (age of onset: 54) in her father; Leukemia in her mother. There is no history  of Migraines.  ROS:   Please see the history of present illness.     All other systems reviewed and are negative.  EKGs/Labs/Other Studies Reviewed:    The following studies were reviewed today: Recent CT chest/abd/pelvis      Recent Labs: 06/23/2024: ALT 16; BUN 14; Creatinine 1.22; Hemoglobin 13.1; Platelet Count 116; Potassium 3.4; Sodium 145  Recent Lipid Panel    Component Value Date/Time   CHOL 177 12/05/2020 0949   TRIG 183 (H) 12/05/2020 0949   HDL 56 12/05/2020 0949   CHOLHDL  3.2 12/05/2020 0949   CHOLHDL 3.4 12/27/2016 1029   VLDL 28 12/27/2016 1029   LDLCALC 90 12/05/2020 0949     Risk Assessment/Calculations:                Physical Exam:    VS:  BP 134/62 (BP Location: Right Arm, Cuff Size: Small)   Pulse 72   Ht 5' (1.524 m)   Wt 116 lb 12.8 oz (53 kg)   SpO2 98%   BMI 22.81 kg/m     Wt Readings from Last 3 Encounters:  07/16/24 116 lb 12.8 oz (53 kg)  07/08/24 119 lb 9.6 oz (54.3 kg)  07/07/24 121 lb (54.9 kg)     GEN:  Well nourished, well developed in no acute distress HEENT: Normal NECK: No JVD; No carotid bruits LYMPHATICS: No lymphadenopathy CARDIAC: RRR, no murmurs, rubs, gallops RESPIRATORY:  Clear to auscultation without rales, wheezing or rhonchi  ABDOMEN: Soft, non-tender, non-distended MUSCULOSKELETAL:  No edema; No deformity  SKIN: Warm and dry NEUROLOGIC:  Alert and oriented x 3 PSYCHIATRIC:  Normal affect   ASSESSMENT:    1. Hyperlipidemia with target LDL less than 70   2. Primary hypertension   3. Stage 3b chronic kidney disease (HCC)   4. Aortic atherosclerosis    PLAN:    In order of problems listed above:  Hypertension - appears to be well controlled on exam today -continue current therapy - will add potassium supplement 20 meq every other day since K+ runs low on HCT - stressed importance of low sodium diet and regular aerobic activity.      Hyperlipidemia  - has not had lipid panel in > 3 years. Will  add to next lab draw. Goal LDL < 70 Continue lipitor 20 mg     Prior cardiomyopathy - felt induced by chemotherapy - last echo 2021 that showed normalization of LVEF 55-60% - reassuring NM 2014     Aortic atherosclerosis. Noted on CT. Minimal coronary calcification. Optimize risk factors.      Follow up in one year          Medication Adjustments/Labs and Tests Ordered: Current medicines are reviewed at length with the patient today.  Concerns regarding medicines are outlined above.  Orders Placed This Encounter  Procedures   Lipid panel   Meds ordered this encounter  Medications   potassium chloride  SA (KLOR-CON  M) 20 MEQ tablet    Sig: Take 1 tablet (20 mEq total) by mouth every other day.    Dispense:  45 tablet    Refill:  3    There are no Patient Instructions on file for this visit.   Signed, Lewin Pellow, MD  07/16/2024 8:14 AM     [facility]     [1]  Current Meds  Medication Sig   albuterol  (VENTOLIN  HFA) 108 (90 Base) MCG/ACT inhaler Inhale 2 puffs into the lungs every 6 (six) hours as needed for wheezing or shortness of breath.   alendronate  (FOSAMAX ) 70 MG tablet TAKE 1 TABLET BY MOUTH EVERY 7 DAYS. TAKE WITH A FULL GLASS OF WATER ON AN EMPTY STOMACH   aspirin  EC 81 MG tablet Take 1 tablet (81 mg total) by mouth 2 (two) times daily.   atorvastatin  (LIPITOR) 20 MG tablet Take 1 tablet (20 mg total) by mouth daily.   Melatonin 3 MG CAPS Take 3 mg by mouth at bedtime as needed (sleep).   metoprolol  succinate (TOPROL -XL) 100 MG 24 hr tablet TAKE 1  TABLET BY MOUTH DAILY. TAKE WITH OR IMMEDIATELY FOLLOWING A MEAL.   olmesartan -hydrochlorothiazide  (BENICAR  HCT) 40-12.5 MG tablet Take 1 tablet by mouth daily.   potassium chloride  SA (KLOR-CON  M) 20 MEQ tablet Take 1 tablet (20 mEq total) by mouth every other day.   topiramate  (TOPAMAX ) 25 MG tablet Take 2 tablets (50 mg total) by mouth 2 (two) times daily.   verapamil  (CALAN -SR) 180 MG CR tablet Take  1 tablet (180 mg total) by mouth at bedtime.   VEVYE  0.1 % SOLN Apply 1 drop to eye 2 (two) times daily.

## 2024-07-16 ENCOUNTER — Ambulatory Visit: Attending: Cardiology | Admitting: Cardiology

## 2024-07-16 ENCOUNTER — Encounter: Payer: Self-pay | Admitting: Cardiology

## 2024-07-16 VITALS — BP 134/62 | HR 72 | Ht 60.0 in | Wt 116.8 lb

## 2024-07-16 DIAGNOSIS — I10 Essential (primary) hypertension: Secondary | ICD-10-CM | POA: Diagnosis present

## 2024-07-16 DIAGNOSIS — N1832 Chronic kidney disease, stage 3b: Secondary | ICD-10-CM | POA: Diagnosis present

## 2024-07-16 DIAGNOSIS — E785 Hyperlipidemia, unspecified: Secondary | ICD-10-CM | POA: Insufficient documentation

## 2024-07-16 DIAGNOSIS — I700 Atherosclerosis of aorta: Secondary | ICD-10-CM | POA: Diagnosis present

## 2024-07-16 MED ORDER — POTASSIUM CHLORIDE CRYS ER 20 MEQ PO TBCR: 20.0000 meq | EXTENDED_RELEASE_TABLET | ORAL | 3 refills | Status: AC

## 2024-07-16 NOTE — Patient Instructions (Signed)
 Medication Instructions:  Start Potassium 20 meq  take one every other day  Continue all other medications *If you need a refill on your cardiac medications before your next appointment, please call your pharmacy*  Lab Work: Have lipid panel done next time you have lab work  Testing/Procedures: None ordered  Follow-Up: At [facility], you and your health needs are our priority.  As part of our continuing mission to provide you with exceptional heart care, our providers are all part of one team.  This team includes your primary Cardiologist (physician) and Advanced Practice Providers or APPs (Physician Assistants and Nurse Practitioners) who all work together to provide you with the care you need, when you need it.  Your next appointment:  1 year    Call in August to schedule Dec appointment     Provider:  Dr.Jordan   We recommend signing up for the patient portal called MyChart.  Sign up information is provided on this After Visit Summary.  MyChart is used to connect with patients for Virtual Visits (Telemedicine).  Patients are able to view lab/test results, encounter notes, upcoming appointments, etc.  Non-urgent messages can be sent to your provider as well.   To learn more about what you can do with MyChart, go to [email or web address].

## 2024-10-13 ENCOUNTER — Ambulatory Visit: Payer: Self-pay

## 2025-01-28 ENCOUNTER — Ambulatory Visit: Admitting: Adult Health

## 2025-03-02 ENCOUNTER — Ambulatory Visit: Admitting: Adult Health

## 2025-07-07 ENCOUNTER — Inpatient Hospital Stay

## 2025-07-07 ENCOUNTER — Inpatient Hospital Stay: Admitting: Internal Medicine
